# Patient Record
Sex: Female | Born: 1935 | Race: White | Hispanic: No | Smoking: Former smoker
Health system: Southern US, Community
[De-identification: ages and names within clinical notes are randomized; demographics above are authoritative.]

## PROBLEM LIST (undated history)

## (undated) DIAGNOSIS — K831 Obstruction of bile duct: Secondary | ICD-10-CM

## (undated) DIAGNOSIS — I509 Heart failure, unspecified: Secondary | ICD-10-CM

## (undated) DIAGNOSIS — J449 Chronic obstructive pulmonary disease, unspecified: Secondary | ICD-10-CM

## (undated) DIAGNOSIS — C50912 Malignant neoplasm of unspecified site of left female breast: Secondary | ICD-10-CM

## (undated) DIAGNOSIS — H3530 Unspecified macular degeneration: Secondary | ICD-10-CM

## (undated) DIAGNOSIS — K219 Gastro-esophageal reflux disease without esophagitis: Secondary | ICD-10-CM

## (undated) DIAGNOSIS — M1990 Unspecified osteoarthritis, unspecified site: Secondary | ICD-10-CM

## (undated) DIAGNOSIS — Z9981 Dependence on supplemental oxygen: Secondary | ICD-10-CM

## (undated) DIAGNOSIS — J189 Pneumonia, unspecified organism: Secondary | ICD-10-CM

## (undated) DIAGNOSIS — D649 Anemia, unspecified: Secondary | ICD-10-CM

## (undated) DIAGNOSIS — I10 Essential (primary) hypertension: Secondary | ICD-10-CM

## (undated) DIAGNOSIS — I4891 Unspecified atrial fibrillation: Secondary | ICD-10-CM

## (undated) HISTORY — PX: BREAST SURGERY: SHX581

## (undated) HISTORY — DX: Pneumonia, unspecified organism: J18.9

## (undated) HISTORY — PX: ADENOIDECTOMY: SHX5191

## (undated) HISTORY — PX: JOINT REPLACEMENT: SHX530

## (undated) HISTORY — PX: PLANTAR'S WART EXCISION: SHX2240

## (undated) HISTORY — PX: CERVICAL DISC SURGERY: SHX588

## (undated) HISTORY — PX: CATARACT EXTRACTION BILATERAL W/ ANTERIOR VITRECTOMY: SHX1304

## (undated) HISTORY — PX: CARDIAC CATHETERIZATION: SHX172

## (undated) HISTORY — PX: BACK SURGERY: SHX140

## (undated) HISTORY — PX: KNEE ARTHROSCOPY: SHX127

## (undated) HISTORY — PX: EXCISION / BIOPSY BREAST / NIPPLE / DUCT: SUR469

## (undated) HISTORY — PX: COLONOSCOPY: SHX174

## (undated) HISTORY — PX: DILATION AND CURETTAGE OF UTERUS: SHX78

## (undated) HISTORY — PX: CHOLECYSTECTOMY OPEN: SUR202

## (undated) HISTORY — PX: ANTERIOR CERVICAL DECOMP/DISCECTOMY FUSION: SHX1161

## (undated) HISTORY — PX: SHOULDER OPEN ROTATOR CUFF REPAIR: SHX2407

---
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
## 1960-02-04 HISTORY — PX: ABDOMINAL HYSTERECTOMY: SHX81

## 1998-01-11 ENCOUNTER — Ambulatory Visit (HOSPITAL_COMMUNITY): Admission: RE | Admit: 1998-01-11 | Discharge: 1998-01-11 | Payer: Self-pay | Admitting: Internal Medicine

## 1999-12-04 ENCOUNTER — Encounter: Payer: Self-pay | Admitting: Internal Medicine

## 1999-12-04 ENCOUNTER — Encounter: Admission: RE | Admit: 1999-12-04 | Discharge: 1999-12-04 | Payer: Self-pay | Admitting: Internal Medicine

## 2000-12-09 ENCOUNTER — Encounter: Payer: Self-pay | Admitting: Internal Medicine

## 2000-12-09 ENCOUNTER — Encounter: Admission: RE | Admit: 2000-12-09 | Discharge: 2000-12-09 | Payer: Self-pay | Admitting: Internal Medicine

## 2001-03-23 ENCOUNTER — Ambulatory Visit (HOSPITAL_COMMUNITY): Admission: RE | Admit: 2001-03-23 | Discharge: 2001-03-23 | Payer: Self-pay | Admitting: Internal Medicine

## 2001-09-02 ENCOUNTER — Ambulatory Visit (HOSPITAL_BASED_OUTPATIENT_CLINIC_OR_DEPARTMENT_OTHER): Admission: RE | Admit: 2001-09-02 | Discharge: 2001-09-02 | Payer: Self-pay | Admitting: Orthopedic Surgery

## 2001-12-10 ENCOUNTER — Encounter: Payer: Self-pay | Admitting: Internal Medicine

## 2001-12-10 ENCOUNTER — Encounter: Admission: RE | Admit: 2001-12-10 | Discharge: 2001-12-10 | Payer: Self-pay | Admitting: Internal Medicine

## 2002-02-17 ENCOUNTER — Ambulatory Visit (HOSPITAL_BASED_OUTPATIENT_CLINIC_OR_DEPARTMENT_OTHER): Admission: RE | Admit: 2002-02-17 | Discharge: 2002-02-18 | Payer: Self-pay | Admitting: Orthopedic Surgery

## 2002-10-07 ENCOUNTER — Encounter: Payer: Self-pay | Admitting: Internal Medicine

## 2002-10-07 ENCOUNTER — Encounter: Admission: RE | Admit: 2002-10-07 | Discharge: 2002-10-07 | Payer: Self-pay | Admitting: Internal Medicine

## 2002-12-15 ENCOUNTER — Encounter: Admission: RE | Admit: 2002-12-15 | Discharge: 2002-12-15 | Payer: Self-pay | Admitting: Internal Medicine

## 2003-02-07 ENCOUNTER — Encounter: Admission: RE | Admit: 2003-02-07 | Discharge: 2003-02-07 | Payer: Self-pay | Admitting: Orthopedic Surgery

## 2004-01-02 ENCOUNTER — Inpatient Hospital Stay (HOSPITAL_COMMUNITY): Admission: EM | Admit: 2004-01-02 | Discharge: 2004-01-04 | Payer: Self-pay | Admitting: Emergency Medicine

## 2004-01-06 ENCOUNTER — Inpatient Hospital Stay (HOSPITAL_COMMUNITY): Admission: EM | Admit: 2004-01-06 | Discharge: 2004-01-07 | Payer: Self-pay | Admitting: Neurosurgery

## 2004-02-15 ENCOUNTER — Encounter: Admission: RE | Admit: 2004-02-15 | Discharge: 2004-02-15 | Payer: Self-pay | Admitting: Internal Medicine

## 2004-12-13 ENCOUNTER — Ambulatory Visit: Payer: Self-pay | Admitting: Gastroenterology

## 2004-12-25 ENCOUNTER — Ambulatory Visit: Payer: Self-pay | Admitting: Gastroenterology

## 2005-01-15 ENCOUNTER — Ambulatory Visit: Payer: Self-pay | Admitting: Gastroenterology

## 2005-01-28 ENCOUNTER — Ambulatory Visit: Payer: Self-pay | Admitting: Gastroenterology

## 2005-02-06 ENCOUNTER — Encounter (INDEPENDENT_AMBULATORY_CARE_PROVIDER_SITE_OTHER): Payer: Self-pay | Admitting: *Deleted

## 2005-02-06 ENCOUNTER — Ambulatory Visit: Payer: Self-pay | Admitting: Gastroenterology

## 2005-02-19 ENCOUNTER — Ambulatory Visit: Payer: Self-pay | Admitting: Gastroenterology

## 2005-02-20 ENCOUNTER — Encounter: Admission: RE | Admit: 2005-02-20 | Discharge: 2005-02-20 | Payer: Self-pay | Admitting: Internal Medicine

## 2006-02-23 ENCOUNTER — Encounter: Admission: RE | Admit: 2006-02-23 | Discharge: 2006-02-23 | Payer: Self-pay | Admitting: *Deleted

## 2006-03-04 ENCOUNTER — Encounter: Admission: RE | Admit: 2006-03-04 | Discharge: 2006-03-04 | Payer: Self-pay | Admitting: Internal Medicine

## 2006-04-22 ENCOUNTER — Encounter: Admission: RE | Admit: 2006-04-22 | Discharge: 2006-04-22 | Payer: Self-pay | Admitting: Internal Medicine

## 2006-07-01 ENCOUNTER — Encounter: Admission: RE | Admit: 2006-07-01 | Discharge: 2006-07-01 | Payer: Self-pay | Admitting: Internal Medicine

## 2007-02-25 ENCOUNTER — Encounter: Admission: RE | Admit: 2007-02-25 | Discharge: 2007-02-25 | Payer: Self-pay | Admitting: Internal Medicine

## 2007-04-16 ENCOUNTER — Encounter: Admission: RE | Admit: 2007-04-16 | Discharge: 2007-04-16 | Payer: Self-pay | Admitting: Internal Medicine

## 2008-02-28 ENCOUNTER — Encounter: Admission: RE | Admit: 2008-02-28 | Discharge: 2008-02-28 | Payer: Self-pay | Admitting: Internal Medicine

## 2008-05-17 ENCOUNTER — Encounter: Admission: RE | Admit: 2008-05-17 | Discharge: 2008-05-17 | Payer: Self-pay | Admitting: Internal Medicine

## 2008-10-30 ENCOUNTER — Inpatient Hospital Stay (HOSPITAL_COMMUNITY): Admission: RE | Admit: 2008-10-30 | Discharge: 2008-10-31 | Payer: Self-pay | Admitting: Neurosurgery

## 2008-10-30 HISTORY — PX: LUMBAR DISC SURGERY: SHX700

## 2009-02-28 ENCOUNTER — Encounter: Admission: RE | Admit: 2009-02-28 | Discharge: 2009-02-28 | Payer: Self-pay | Admitting: Internal Medicine

## 2010-02-23 ENCOUNTER — Encounter: Payer: Self-pay | Admitting: Internal Medicine

## 2010-03-01 ENCOUNTER — Encounter
Admission: RE | Admit: 2010-03-01 | Discharge: 2010-03-01 | Payer: Self-pay | Source: Home / Self Care | Attending: Internal Medicine | Admitting: Internal Medicine

## 2010-04-15 ENCOUNTER — Other Ambulatory Visit: Payer: Self-pay | Admitting: Family Medicine

## 2010-04-15 ENCOUNTER — Ambulatory Visit
Admission: RE | Admit: 2010-04-15 | Discharge: 2010-04-15 | Disposition: A | Discharge status: Home / Self Care | Payer: Medicare Other | Source: Ambulatory Visit | Attending: Family Medicine | Admitting: Family Medicine

## 2010-04-15 DIAGNOSIS — J40 Bronchitis, not specified as acute or chronic: Secondary | ICD-10-CM

## 2010-05-10 LAB — CBC
HCT: 38.4 % (ref 36.0–46.0)
Hemoglobin: 13 g/dL (ref 12.0–15.0)
MCHC: 33.9 g/dL (ref 30.0–36.0)
MCV: 92.1 fL (ref 78.0–100.0)
Platelets: 257 10*3/uL (ref 150–400)
RBC: 4.17 MIL/uL (ref 3.87–5.11)
RDW: 13.8 % (ref 11.5–15.5)
WBC: 8.5 10*3/uL (ref 4.0–10.5)

## 2010-05-10 LAB — BASIC METABOLIC PANEL
BUN: 31 mg/dL — ABNORMAL HIGH (ref 6–23)
CO2: 30 mEq/L (ref 19–32)
Calcium: 10 mg/dL (ref 8.4–10.5)
Chloride: 104 mEq/L (ref 96–112)
Creatinine, Ser: 1.4 mg/dL — ABNORMAL HIGH (ref 0.4–1.2)
GFR calc Af Amer: 45 mL/min — ABNORMAL LOW (ref >60)
GFR calc non Af Amer: 37 mL/min — ABNORMAL LOW (ref >60)
Glucose, Bld: 110 mg/dL — ABNORMAL HIGH (ref 70–99)
Potassium: 4.1 mEq/L (ref 3.5–5.1)
Sodium: 143 mEq/L (ref 135–145)

## 2010-06-21 NOTE — Op Note (Signed)
NAMEJANETTE, HARVIE NO.:  [id number]   MEDICAL RECORD NO.:  [id number]          PATIENT TYPE:  OIB   LOCATION:  3006                         FACILITY:  [facility]   PHYSICIAN:  Hewitt Shorts, M.D.DATE OF BIRTH:  03/03/1933   DATE OF PROCEDURE:  DATE OF DISCHARGE:  01/07/2004                                 OPERATIVE REPORT   DATE OF OPERATION:  January 06, 2004.   PREOPERATIVE DIAGNOSES:  C5-6 and C6-7 cervical disk herniation, cervical  spondylosis, cervical degenerative disk disease, and cervical radiculopathy.   POSTOPERATIVE DIAGNOSES:  C5-6 and C6-7 cervical disk herniation, cervical  spondylosis, cervical degenerative disk disease, and cervical radiculopathy.   PROCEDURE:  C5-6 and C6-7 anterior cervical diskectomy and arthrodesis with  iliac crest allograft and tether cervical plating.   SURGEON:  Hewitt Shorts, MD.   Threasa HeadsPhoebe Perch.   ANESTHESIA:  General endotracheal.   INDICATION:  The patient is a 77 year old woman, who presented with an acute  left cervical radiculopathy, who was found by MRI scan to have large a disk  herniation, central and to the left, at C6-7, and a broad-based disk  herniation at C5-6 superimposed upon underlying degenerative disk disease  and spondylosis.  The decision was made to proceed with a two-level anterior  cervical diskectomy and arthrodesis.   DESCRIPTION OF PROCEDURE:  The patient was brought to the operating room and  placed under general endotracheal anesthesia.  The patient was placed in 10  pounds of halter traction.  The neck was prepped with Betadine soap and  solution and draped in a sterile fashion.  After local anesthesia with  epinephrine, an incision was made and carried down through the subcutaneous  tissue, bipolar cautery and electrocautery used to maintain hemostasis.  Dissection was then carried out through an avascular plane leaving the  sternocleidomastoid, the parotid artery, and  jugular vein artery, trachea,  and esophagus medially.  The ventral aspect of the vertebral column was  identified and a localizing x-ray taken, and the C5-6 and C6-7  intervertebral disk space identified.  Diskectomy was begun at each level  incision of the annulus and continued with the microcurettes and pituitary  rongeurs.  Anterior osteophytic overgrowth was removed using an osteophyte  removal tool as well as the Ex Max drill.  The cartilaginous end plates of  the corresponding vertebra were removed using microcurettes along with the  Ex Max drill, and the microscope was draped and brought onto the field to  provide additional magnification, illumination, and visualization, and the  remainder of the decompression was performed using microdissection in a  microsurgical technique.  There was significant posterior osteophytic  overgrowth at each level.  This was removed using an Ex Max drill along with  a 2 mm Kerrison punch with a thin footplate.  The spondylitic disk  herniation at the C5-6 level was carefully removed, and we were able to  decompress the spinal canal and the thecal sac as well as a narrow foramina  and nerve roots bilaterally.  At the C6-7 level, the second posterior  longitudinal  ligament was removed, the disk herniation was removed, and we  were able to decompress the spinal canal and thecal sac as well as the nerve  root and neuroforamen bilaterally.  Once the decompression was completed,  hemostasis was established with the use of Gelfoam soaked in thrombin.  We  measured the height of each of the intervertebral disk spaces with bone  sizers and selected a 6-mm graft for the C6-7 level and a 7-mm graft for the  C5-6 level.  Each graft was hydrated in saline solution and then  subsequently positioned in the intervertebral disk space and counter sunk.  The traction was then discontinued.  We selected a 32 mm tether cervical  plate.  It was positioned over the fusion  construct and secured to each of  the vertebra with bone screws measuring 4 x 14 mm.  Two screws were placed  at C5 and two at C7, and a single screw placed at C6.  Each screw hole was  drilled and tapped, and the screws were placed.  The screws themselves were  placed in an alternating fashion.  Once all five screws were placed a final  tightening was done, and then the wound was irrigated with Bacitracin  solution and checked for hemostasis with established confirmed.  An x-ray  was taken; unfortunately, visualization was limited due to the patient's  large shoulders.  The screws at C5 appeared in good position.  However,  under direct visualization the fusion construct appeared good, and then we  proceeded with closure.  The platysma was closed with interrupted, inverted  2-0 undyed Vicryl sutures, the subcutaneous and subcuticular were closed  with interrupted, inverted, 3-0 undyed Vicryl sutures, and the skin was  approximated with Dermabond.  The patient tolerated the procedure well.  Estimated blood loss was 100 mL.  Sponge count correct.  Following surgery,  the patient was placed  in a soft cervical collar, reversed from anesthetic,  to be extubated and transferred to the recovery room for further care.      Robe   RWN/MEDQ  D:  01/06/2004  T:  01/07/2004  Job:  [id number]

## 2010-06-21 NOTE — H&P (Signed)
Jordan Byrd, Jordan Byrd                ACCOUNT NO.:  [id number]   MEDICAL RECORD NO.:  [id number]          PATIENT TYPE:  INP   LOCATION:  1856                         FACILITY:  [facility]   PHYSICIAN:  Erskine Speed, M.D.   DATE OF BIRTH:  Mar 19, 1933   DATE OF ADMISSION:  01/02/2004  DATE OF DISCHARGE:                                HISTORY & PHYSICAL   PROBLEM LIST:  1.  Syncope.   SUBJECTIVE:  1.  Cervical disk disease.  The patient has been having some problems with      pain in her left shoulder. She saw an orthopedist and was ultimately      given an injection into the shoulder but it did not help. Originally,      Dr. Chaney Malling told her that it was bursitis.  Then he was concerned that      it might be a cervical disk and had ordered an MRI that was to be done      tomorrow.  She was put on Vicodin and Flexeril on December 25, 2003.  2.  High blood pressure. The patient's weight was down 7 pounds at the time      of her November 28, 2003 complete physical exam for a total of 30 pounds      of weight reduction on a good diet and she was feeling great.  3.  Reoccurring sinusitis -  no symptoms.  4.  Macular degeneration - no symptoms that are new.  5.  Degenerative joint disease.  6.  Esophageal reflux - no symptoms.   ACUTE PROBLEM:  Syncope.   SUBJECTIVE:  Over the last 3 days patient has had a history of mild  dizziness. There was no actual vertigo but there was a sense of imbalance.  On December 30, 2003 the patient had a presyncopal episode with dimming of  her vision and nearly fainted. Today when she got up she had a syncopal  episode that lasted 1-2 seconds and she fell to the floor (on the carpet)  without injury.  When she awoke she called for help. She has had some mild  vertigo but mostly just marked dizziness and mild nausea with sudden  position change into the sitting or standing position.  When she is supine  she feels fine. There has been no chest pain or  shortness of breath. The  patient did continue to have left shoulder pain from her orthopedic problems  as previously described.  There has been no history of GI bleeding,  respiratory problems, cough or hemoptysis.  There has been no fever.   MEDICATIONS:  1.  Advair 50/250 2 puffs q.a.m.  2.  Diovan 325 mg daily.  3.  Hydrochlorothiazide 25 mg daily.  4.  Cardura 8 mg daily.  5.  Toprol XL 50 mg daily.  6.  Premarin 0.625 mg daily.  7.  Relafen 750 mg daily.  8.  Aciphex 20 mg daily.   ALLERGIES:  No known drug allergies - none listed.   FAMILY HISTORY:  Noncontributory.   PAST HISTORY:  Noncontributory.  OBJECTIVE:  VITAL SIGNS:  Supine blood pressure was 135/85 with a heart rate  of 80. Sitting, blood pressure was 90/70 with a heart rate of 90.  HEENT:  Upper dental plates are noted.  NECK:  Normal. Carotids are 2+ without bruit. Thyroid was negative. Neck  exam revealed the same posterior cervical HNP scar previously outlined.  CHEST:  Clear.  BREAST EXAM:  Unremarkable.  CARDIOVASCULAR:  Regular rate and rhythm without murmur.  ABDOMEN:  Soft, flat and nontender, no organomegaly or abnormal masses.  There were no abdominal bruits.  The patient is status post cholecystectomy.  PELVIC:  Deferred.  RECTAL:  Was done on November 28, 2003 and it was normal and not repeated at  this time.  SKIN:  Joints are_______and distal circulation was unremarkable.  EXTREMITIES:  There was trace to 12+ pretibial edema.   ASSESSMENT:  Postural hypotension, could be related to her medications.   PLAN:  Admit for intravenous fluids, further evaluation. Cardiac monitoring.  Hold all potentially narcotic medications except Halcion. Recheck in the  morning.       EJG/MEDQ  D:  01/02/2004  T:  01/02/2004  Job:  [id number]

## 2010-06-21 NOTE — Op Note (Signed)
NAME:  Jordan Byrd, Jordan Byrd                          ACCOUNT NO.:  [id number]   MEDICAL RECORD NO.:  [id number]                   PATIENT TYPE:  AMB   LOCATION:  DSC                                  FACILITY:  [facility]   PHYSICIAN:  Rodney A. Chaney Malling, M.D.           DATE OF BIRTH:  February 08, 1933   DATE OF PROCEDURE:  02/17/2002  DATE OF DISCHARGE:                                 OPERATIVE REPORT   PREOPERATIVE DIAGNOSIS:  Frayed biceps attachment, left shoulder.  Impingement syndrome, left shoulder.   POSTOPERATIVE DIAGNOSIS:  Frayed biceps attachment, left shoulder.  Impingement syndrome, left shoulder.   OPERATION PERFORMED:  Diagnostic arthroscopy, intra-articular debridement  biceps attachment, left shoulder; open subacromial decompression, left  shoulder.   SURGEON:  Lenard Galloway. Chaney Malling, M.D.   ANESTHESIA:  General.   DESCRIPTION OF PROCEDURE:  After satisfactory general anesthesia, the  patient was placed on the operating table in a semi seated position, the  left upper extremity and shoulder were prepped with DuraPrep and draped out  in the usual manner.  Arthroscope was introduced into the posterior portal.  Very careful examination of the entire contents was undertaken.  The  articular cartilage over the over the humeral head and the glenoid was  absent.  The posterior inferior and anterior glenoid and labrum appeared  normal.  Superiorly the biceps could be seen coming in the shoulder and ran  proximally to its attachment at the superior margin of the glenoid and this  was freed.  This was freed only on the undersurface.  Through an anterior  operative portal, a probe was inserted.  The biceps was palpated with the  probe.  The biceps could not be displaced into the joint.  However, there  was significant fraying of the biceps at its attachment, but this did not  appear to be unstable.  Through the anterior portal, an intra-articular  shaver was introduced and the frayed  portion of the biceps was debrided.  No  other significant pathology seen in the shoulder at this point.  The  arthroscope was then placed in the subacromial space and no tears of the  rotator cuff could be seen.  A saber cut incision was made over the  anterolateral aspect of the shoulder.  Skin edges were retracted.  The  deltoid fibers were released off the anterior aspect of the acromion only.  Using the power saw a very generous Neer anterior one third acromioplasty  was done and excellent visualization was seen.  No tears of the rotator cuff  could be seen.  No evidence of any other abnormality was noted. Throughout  the procedure, the shoulder was irrigated with copious amounts of antibiotic  solution.  Using 0 Vicryl, the deltoid cuff was then reattached to the  anterior aspect of the acromion.  2-0 Vicryl was used to close the  subcutaneous tissue.  Stainless steel staples were used to close  the skin.  Marcaine infiltrated around the wound.  Sterile dressings were applied.  The  patient was then transferred to the recovery room in excellent condition.  Technically, this procedure went extremely well.   DRAINS:  None.   COMPLICATIONS:  None.                                                Rodney A. Chaney Malling, M.D.   RAM/MEDQ  D:  02/17/2002  T:  02/17/2002  Job:  [id number]

## 2010-06-21 NOTE — Op Note (Signed)
NAME:  Jordan Byrd, Jordan Byrd                          ACCOUNT NO.:  [id number]   MEDICAL RECORD NO.:  [id number]                   PATIENT TYPE:   LOCATION:                                       FACILITY:   PHYSICIAN:  Rodney A. Chaney Malling, M.D.           DATE OF BIRTH:   DATE OF PROCEDURE:  09/02/2001  DATE OF DISCHARGE:                                 OPERATIVE REPORT   PREOPERATIVE DIAGNOSES:  1. Osteoarthritis, right knee.  2. Tear of medial meniscus, right knee.   POSTOPERATIVE DIAGNOSES:  1. Osteoarthritis, right knee.  2. Complex tear of posterior horn of medial meniscus, right knee.  3. Large anterior osteophyte at the intercondylar notch area of the right     knee.   OPERATIONS:  1. Arthroscopy.  2. Removal of osteophytic spur of tibial notch area.  3. Partial medial meniscectomy, right knee.   SURGEON:  Lenard Galloway. Chaney Malling, M.D.   ANESTHESIA:  General.   PATHOLOGY:  With the arthroscope, very careful examination of the entire  contents was undertaken.  The patellofemoral joint was visualized first.  There was a large area of grade 4 total loss of articular cartilage over the  posterior aspect of the toe.  There was also a very large area over the  anterior aspect of the lateral femoral condyle where there was total loss of  all articular cartilage.  There was a ridge of osteophytes along the  superior margin of the medial femoral condyle anteriorly.  With the  arthroscope in the lateral compartment, the articular cartilage of the  lateral femoral condyle in the weightbearing area was fairly normal as was  the articular cartilage of the lateral tibial plateau.  The entire lateral  meniscus was normal.  The arthroscope was then passed into the intercondylar  notch.  The ACL was intact, but there was a very large osteophytic spur  anterior to the tibial spine, which on extension was banging into the  intercondylar notch.  The arthroscope was then passed into the medial  compartment and there was a complex tear of the posterior horn of the medial  meniscus.  The articular cartilage of the medial femoral condyle and medial  tibial plateau was fairly normal.   DESCRIPTION OF PROCEDURE:  The patient was placed on the operating table in  the supine position with the pneumatic tourniquet about the right upper  thigh.  The right leg was placed in a leg holder.  The entire right lower  extremity was prepped and draped in the usual sterile manner.  An infusion  cannula was placed in the superior medial pouch and the knee distended with  saline.  Anteromedial and anterolateral portals were introduced.  The  findings were as described above.  Attention was first turned to the tibial  spine area.  There was a very large anterior osteophytic spur.  An osteotome  was slipped in the lateral portal  and the base of the osteophyte was  amputated.  The pituitary rongeur was then inserted and this large  osteophyte was removed as a single large entity.  The base was then debrided  with intra-articular shaver.  Attention was then turned to the medial  compartment.  Through both portals the posterior third of the medial  meniscus was extensively debrided.  Excellent decompression was achieved.  The intra-articular shaver was then introduced and all debris was removed.  The posterior rim was smooth and  balanced with nice transition of the mid third of the medial meniscus.  The  knee was then filled with Marcaine.  A large bulky compressive dressing was  applied.  The patient then returned to the recovery room in excellent  condition.  The technique went extremely well.                                               Rodney A. Chaney Malling, M.D.    RAM/MEDQ  D:  09/02/2001  T:  09/07/2001  Job:  [id number]

## 2010-06-21 NOTE — Discharge Summary (Signed)
NAMEBERNETHA, ANSCHUTZ                ACCOUNT NO.:  [id number]   MEDICAL RECORD NO.:  [id number]          PATIENT TYPE:  INP   LOCATION:  6524                         FACILITY:  [facility]   PHYSICIAN:  Erskine Speed, M.D.   DATE OF BIRTH:  05/09/1933   DATE OF ADMISSION:  01/02/2004  DATE OF DISCHARGE:  01/04/2004                                 DISCHARGE SUMMARY   PROBLEM:  Hypotension.   SUBJECTIVE:  This 77 year old patient had been on pain medication after a  procedure for her back and neck disk disease.  She had been put on Vicodin  and Flexeril by her orthopedist.  When she was seen on the day of admission,  blood pressure was dropping at 135/85 supine and 90/70 sitting.  Because her  postural hypotension probably secondary to medications, the patient was  admitted to the hospital.   HOSPITAL COURSE:  The patient was admitted to the hospital and IV fluids  were given.  Narcotics were stopped.  Blood pressure medicines were held.  On January 03, 2004, the patient felt much better.  She had no dizziness  when she was walking to the bathroom.  Cardiac monitor revealed normal sinus  rhythm.  Vital signs were stable.   By January 04, 2004, the patient was feeling fine.  She was having some neck  and shoulder pain again, but no dizziness or syncope.  With blood pressure  having been stabilized, the patient was going to be ready for discharge.  As  she was ultimately going to have an MRI of the neck ordered by Dr.  Chaney Malling, this was performed prior to her discharge.  An MRI of the  cervical spine was performed on January 04, 2004.  It showed a prominent  soft disk herniation C5 and 6 into the left neural foramen, severely  compressing left C7.  Other nonsurgical findings are noted on the report.   PLAN:  A copy of this was referred to Dr. Shirlean Kelly for immediate  evaluation.  The patient was discharged on Tylenol for pain, no blood  pressure medicines.  Ultracet two pills three  times a day for pain.  She was  to see Dr. Newell Coral immediately to consider neck surgery.  She is to see Dr.  Erskine Speed on January 08, 2004.   FINAL DIAGNOSIS:  Hypotension secondary to narcotic pain medicines and  muscle relaxers.   Discharge condition improved.   CONSULTATIONS:  None.   PROCEDURE:  None.   COMPLICATIONS:  None.   DISCHARGE ACTIVITIES:  Walk carefully.  Avoid falling.  Pain management as  noted.   DISCHARGE DIET:  No added salt.  Generally no restrictions in her usual  diet.   Followup with Dr. Newell Coral as noted above.       EJG/MEDQ  D:  02/16/2004  T:  02/16/2004  Job:  [id number]

## 2010-10-08 ENCOUNTER — Other Ambulatory Visit: Payer: Self-pay | Admitting: Neurosurgery

## 2010-10-08 DIAGNOSIS — M549 Dorsalgia, unspecified: Secondary | ICD-10-CM

## 2010-10-16 ENCOUNTER — Ambulatory Visit
Admission: RE | Admit: 2010-10-16 | Discharge: 2010-10-16 | Disposition: A | Discharge status: Home / Self Care | Payer: Medicare Other | Source: Ambulatory Visit | Attending: Neurosurgery | Admitting: Neurosurgery

## 2010-10-16 DIAGNOSIS — M549 Dorsalgia, unspecified: Secondary | ICD-10-CM

## 2010-10-16 MED ORDER — GADOBENATE DIMEGLUMINE 529 MG/ML IV SOLN
10.0000 mL | Freq: Once | INTRAVENOUS | Status: AC | PRN
  Administered 2010-10-16: 10 mL via INTRAVENOUS

## 2011-02-17 ENCOUNTER — Other Ambulatory Visit: Payer: Self-pay | Admitting: Internal Medicine

## 2011-02-17 DIAGNOSIS — Z1231 Encounter for screening mammogram for malignant neoplasm of breast: Secondary | ICD-10-CM

## 2011-03-14 ENCOUNTER — Ambulatory Visit
Admission: RE | Admit: 2011-03-14 | Discharge: 2011-03-14 | Disposition: A | Discharge status: Home / Self Care | Payer: Medicare Other | Source: Ambulatory Visit | Attending: Internal Medicine | Admitting: Internal Medicine

## 2011-03-14 ENCOUNTER — Encounter (HOSPITAL_COMMUNITY): Payer: Self-pay | Admitting: Pharmacy Technician

## 2011-03-14 DIAGNOSIS — Z1231 Encounter for screening mammogram for malignant neoplasm of breast: Secondary | ICD-10-CM

## 2011-03-17 ENCOUNTER — Other Ambulatory Visit: Payer: Self-pay | Admitting: Physician Assistant

## 2011-03-18 ENCOUNTER — Encounter (HOSPITAL_COMMUNITY): Payer: Self-pay

## 2011-03-18 ENCOUNTER — Encounter (HOSPITAL_COMMUNITY)
Admission: RE | Admit: 2011-03-18 | Discharge: 2011-03-18 | Disposition: A | Discharge status: Home / Self Care | Payer: Medicare Other | Source: Ambulatory Visit | Attending: Orthopedic Surgery | Admitting: Orthopedic Surgery

## 2011-03-18 ENCOUNTER — Other Ambulatory Visit: Payer: Self-pay

## 2011-03-18 HISTORY — DX: Unspecified osteoarthritis, unspecified site: M19.90

## 2011-03-18 HISTORY — DX: Essential (primary) hypertension: I10

## 2011-03-18 LAB — DIFFERENTIAL
Eosinophils Relative: 4 % (ref 0–5)
Lymphocytes Relative: 20 % (ref 12–46)
Lymphs Abs: 1.7 10*3/uL (ref 0.7–4.0)
Monocytes Absolute: 0.5 10*3/uL (ref 0.1–1.0)
Neutro Abs: 5.8 10*3/uL (ref 1.7–7.7)

## 2011-03-18 LAB — SURGICAL PCR SCREEN
MRSA, PCR: NEGATIVE
Staphylococcus aureus: NEGATIVE

## 2011-03-18 LAB — COMPREHENSIVE METABOLIC PANEL
ALT: 13 U/L (ref 0–35)
AST: 15 U/L (ref 0–37)
Albumin: 3.8 g/dL (ref 3.5–5.2)
Calcium: 10.2 mg/dL (ref 8.4–10.5)
Creatinine, Ser: 1.16 mg/dL — ABNORMAL HIGH (ref 0.50–1.10)
Sodium: 140 mEq/L (ref 135–145)
Total Protein: 7.5 g/dL (ref 6.0–8.3)

## 2011-03-18 LAB — CBC
MCH: 29.3 pg (ref 26.0–34.0)
MCHC: 32 g/dL (ref 30.0–36.0)
MCV: 91.5 fL (ref 78.0–100.0)
Platelets: 323 10*3/uL (ref 150–400)
RBC: 4.47 MIL/uL (ref 3.87–5.11)
RDW: 13.3 % (ref 11.5–15.5)

## 2011-03-18 LAB — URINALYSIS, ROUTINE W REFLEX MICROSCOPIC
Bilirubin Urine: NEGATIVE
Hgb urine dipstick: NEGATIVE
Nitrite: NEGATIVE
Protein, ur: NEGATIVE mg/dL
Specific Gravity, Urine: 1.021 (ref 1.005–1.030)
Urobilinogen, UA: 0.2 mg/dL (ref 0.0–1.0)

## 2011-03-18 LAB — TYPE AND SCREEN
ABO/RH(D): A NEG
Antibody Screen: NEGATIVE

## 2011-03-18 LAB — URINE MICROSCOPIC-ADD ON

## 2011-03-18 MED ORDER — CHLORHEXIDINE GLUCONATE 4 % EX LIQD: 60.0000 mL | Freq: Once | CUTANEOUS | Status: DC

## 2011-03-18 NOTE — Progress Notes (Signed)
Dr Beverely Pace called for current cardiac info.  Ekg,stress.

## 2011-03-18 NOTE — Pre-Procedure Instructions (Signed)
20 Jordan Byrd  03/18/2011   Your procedure is scheduled on:  03/28/11  Report to [facility] at *530 AM.  Call this number if you have problems the morning of surgery: [phone]   Remember:   Do not eat food:After Midnight.  May have clear liquids: up to 4 Hours before arrival.  Clear liquids include soda, tea, black coffee, apple or grape juice, broth.  Take these medicines the morning of surgery with A SIP OF WATER: *amlodipine,advair,toprol,prilosec   Do not wear jewelry, make-up or nail polish.  Do not wear lotions, powders, or perfumes. You may wear deodorant.  Do not shave 48 hours prior to surgery.  Do not bring valuables to the hospital.  Contacts, dentures or bridgework may not be worn into surgery.  Leave suitcase in the car. After surgery it may be brought to your room.  For patients admitted to the hospital, checkout time is 11:00 AM the day of discharge.   Patients discharged the day of surgery will not be allowed to drive home.  Name and phone number of your driver: family  Special Instructions: CHG Shower Use Special Wash: 1/2 bottle night before surgery and 1/2 bottle morning of surgery.   Please read over the following fact sheets that you were given: Pain Booklet, Coughing and Deep Breathing, Blood Transfusion Information, MRSA Information and Surgical Site Infection Prevention

## 2011-03-19 NOTE — Consult Note (Addendum)
Anesthesia:  Patient is a 78 year old female scheduled for a right THA on 03/28/11.  History includes asthma, HTN, former smoker, anxiety, and SOB, L2-S1 laminectomy 2010, and previous c-spine, shoulder, and knee surgery.  Labs reviewed.  BUN is elevated at 32.  Cr is 1.16.  UA shows large number of leukocytes, negative nitrites, WBC too many to count.  Final urine culture is pending, but >100,000 colonies GNR are present (result called to [facility] at Dr. Candise Bowens office on 03/20/11 @ 0915).  CXR from 04/15/10 shows no active lung disease. Stable mild cardiomegaly.  EKG from 03/18/11 shows NSR, LAD.  Overall I think the EKG appears stable.  She denies CP.  She saw Dr. Iline Oven Cheek with [facility] approximately 10 years ago for what sounds like an abnormal stress test, but subsequently had a "normal" cath [facility]).  She's unsure if she ever had an echo, but says that if it was done it would have been > 3 years ago.  Anticipate that she can proceed if no new CV symptoms.

## 2011-03-20 LAB — URINE CULTURE

## 2011-03-24 NOTE — H&P (Signed)
NAME: Jordan Byrd MRN: #[id number] DATE: March 13, 2011 DOB: 1933-08-16  COMPLAINT:     Follow up right hip osteoarthritis.  HPI:     The patient is a 78 year old female who presents with a history of right hip osteoarthritis, end stage; also with hypertension, asthma, and macular degeneration, and extensive lumbar and cervical spine history with Dr. Newell Coral.  She has failed conservative treatments with oral anti-inflammatories, intra-articular injection, and pain medications.  She uses a walker at baseline.  She has had previous x-rays done on 02/27/11 showing right hip severe bone-on-bone osteoarthritis.  It is significantly affecting her quality of life and activities of daily living.  She has the most pain with weightbearing.  It will awaken her from sleep at night.  She is here for further discussion of possible total hip arthroplasty today.  CURRENT MEDICATIONS:     HCTZ 25 mg daily, lisinopril 40 mg daily, amlodipine 5 mg daily, metoprolol 100 mg daily, Advair 250/50 mg 2 puffs inhaled daily, Prilosec 20 mg daily, glucosamine chondroitin, calcium 600 mg twice daily, and multivitamin.  ALLERGIES:     CODEINE, TEMARICSPAN, BUTAZDEDINALKA, ASCRINTION #3, ROBAXIN, DORIDIN, and CELEBREX.  She states Vicodin does not mix with her blood pressure medications.  Aspirin upsets her stomach.    PAST MEDICAL HISTORY:     Positive for hypertension, asthma, macular degeneration, significant cervical and lumbar history with Dr. Newell Coral.  PAST SURGICAL HISTORY:     Tonsillectomy 1943, adenoids 1949, foot surgery 1947, appendectomy 1954, hysterectomy 1962, gallbladder 1984, disc surgery upper back 1990, both knees and left shoulder surgery 2008, December 2007 and January 2008 cataract surgery both eyes, and September 2010 back surgery low back.  ROS:    Systemic review is positive for cataracts, glasses, contacts, partial dentures, shortness of breath, pneumonia, bronchitis, pleurisy, high blood pressure,  asthma, ankle swelling, kidney infection, frequent urination, nocturnal urination, headaches, and weight gain. Please refer to patient information form for details.  FAMILY HISTORY:    Positive for high blood pressure, diabetes, and stroke in mother; heart disease, heart attack, high blood pressure, and diabetes in father; heart attack, high blood pressure, diabetes, kidney disease, and cancer in sister.  SOCIAL HISTORY:    The patient is a nonsmoker.  She states she smoked a pack per day for two years; stopped in 1972.  She does not drink alcohol.  She lives alone.  She has three children.  She is widowed.  EXAM:     The patient is seated in the examination room in no acute distress.  She is alert, oriented, and appears appropriate age.  HT:  5'0"   WT:  219 pounds  BMI:  38.8  TEMP:  97.0 degrees  BP:  125/77   P:  80     R:  18  Skin:   No rashes, no lesions. HEENT:   PERRLA.  She has upper dentures.  Neck is supple.  Palpable carotid pulses.     Chest:   Some upper respiratory wheezing; otherwise normal breath sounds, lungs clear to auscultation bilaterally.  Heart:   Regular rate and rhythm.  No signs of murmurs.   Abdomen:   Soft, non tender.  Active bowel sounds in all four quadrants. Rectal/Brest:    Not indicated for surgery. Neuro:   Cranial nerves grossly intact. Musculoskeletal:    Examination of the right lower extremity shows sensation intact to light touch.  Negative tenderness to palpation of the calf.  She has severe  pain of the right hip with internal and external rotation.  Flexion and extension significantly limited.   X-RAYS:     No images were obtained today.  X-rays of the right hip taken on 02/27/11 show right hip bone-on-bone severe osteoarthritis.   IMPRESSION:      1. Right hip osteoarthritis, end stage. 2. Asthma. 3. Hypertension. 4. High cholesterol.  RECOMMENDATIONS:     The risks and benefits of right total hip arthroplasty were discussed again today and the  patient wishes to proceed.  She has a preadmission appointment scheduled on 03/18/11.  Surgery is currently scheduled for 03/28/11.  She has obtained medical and cardiac clearance from her primary care physician, Dr. Nila Nephew, who states he would be available for consultation while inpatient.  The patient does live alone and will require skilled nursing facility following surgery.  The patient was given preoperative instructions.    We also discussed DVT prophylaxis following surgery and perioperative antibiotics.  If she has any questions prior to surgery, she may contact the office.   Josh Baylynn Shifflett P.A.-C/[id number]  Auto-Authenticated by Estanislado Spire P.A.-C  cc:  Nila Nephew, M.D.

## 2011-03-27 MED ORDER — ACETAMINOPHEN 10 MG/ML IV SOLN
1000.0000 mg | Freq: Four times a day (QID) | INTRAVENOUS | Status: DC
  Administered 2011-03-28: 1000 mg via INTRAVENOUS
  Filled 2011-03-27 (×3): qty 100

## 2011-03-27 MED ORDER — SODIUM CHLORIDE 0.9 % IV SOLN: INTRAVENOUS | Status: DC

## 2011-03-28 ENCOUNTER — Encounter (HOSPITAL_COMMUNITY)
Admission: RE | Disposition: A | Discharge status: Skilled Nursing Facility | Payer: Self-pay | Source: Ambulatory Visit | Attending: Orthopedic Surgery

## 2011-03-28 ENCOUNTER — Ambulatory Visit (HOSPITAL_COMMUNITY): Payer: Medicare Other

## 2011-03-28 ENCOUNTER — Ambulatory Visit (HOSPITAL_COMMUNITY): Payer: Medicare Other | Admitting: Vascular Surgery

## 2011-03-28 ENCOUNTER — Encounter (HOSPITAL_COMMUNITY): Payer: Self-pay | Admitting: *Deleted

## 2011-03-28 ENCOUNTER — Encounter (HOSPITAL_COMMUNITY): Payer: Self-pay | Admitting: Vascular Surgery

## 2011-03-28 ENCOUNTER — Inpatient Hospital Stay (HOSPITAL_COMMUNITY)
Admission: RE | Admit: 2011-03-28 | Discharge: 2011-04-01 | DRG: 470 | Disposition: A | Discharge status: Skilled Nursing Facility | Payer: Medicare Other | Source: Ambulatory Visit | Attending: Orthopedic Surgery | Admitting: Orthopedic Surgery

## 2011-03-28 DIAGNOSIS — J45909 Unspecified asthma, uncomplicated: Secondary | ICD-10-CM | POA: Diagnosis present

## 2011-03-28 DIAGNOSIS — D62 Acute posthemorrhagic anemia: Secondary | ICD-10-CM | POA: Diagnosis not present

## 2011-03-28 DIAGNOSIS — M1610 Unilateral primary osteoarthritis, unspecified hip: Principal | ICD-10-CM | POA: Diagnosis present

## 2011-03-28 DIAGNOSIS — F411 Generalized anxiety disorder: Secondary | ICD-10-CM | POA: Diagnosis present

## 2011-03-28 DIAGNOSIS — Z87891 Personal history of nicotine dependence: Secondary | ICD-10-CM

## 2011-03-28 DIAGNOSIS — Z01812 Encounter for preprocedural laboratory examination: Secondary | ICD-10-CM

## 2011-03-28 DIAGNOSIS — M169 Osteoarthritis of hip, unspecified: Principal | ICD-10-CM | POA: Diagnosis present

## 2011-03-28 DIAGNOSIS — I10 Essential (primary) hypertension: Secondary | ICD-10-CM | POA: Diagnosis present

## 2011-03-28 DIAGNOSIS — H3530 Unspecified macular degeneration: Secondary | ICD-10-CM | POA: Diagnosis present

## 2011-03-28 HISTORY — PX: TOTAL HIP ARTHROPLASTY: SHX124

## 2011-03-28 SURGERY — ARTHROPLASTY, HIP, TOTAL,POSTERIOR APPROACH
Anesthesia: General | Site: Hip | Laterality: Right | Wound class: Clean

## 2011-03-28 MED ORDER — HYDROMORPHONE HCL PF 1 MG/ML IJ SOLN
0.2500 mg | INTRAMUSCULAR | Status: DC | PRN
  Administered 2011-03-28 (×2): 0.5 mg via INTRAVENOUS

## 2011-03-28 MED ORDER — CEFAZOLIN SODIUM 1-5 GM-% IV SOLN
1.0000 g | Freq: Four times a day (QID) | INTRAVENOUS | Status: AC
  Administered 2011-03-28 – 2011-03-29 (×3): 1 g via INTRAVENOUS
  Filled 2011-03-28 (×3): qty 50

## 2011-03-28 MED ORDER — ACETAMINOPHEN 10 MG/ML IV SOLN
1000.0000 mg | Freq: Four times a day (QID) | INTRAVENOUS | Status: AC
  Administered 2011-03-28 – 2011-03-29 (×3): 1000 mg via INTRAVENOUS
  Filled 2011-03-28 (×4): qty 100

## 2011-03-28 MED ORDER — METOCLOPRAMIDE HCL 5 MG/ML IJ SOLN
5.0000 mg | Freq: Three times a day (TID) | INTRAMUSCULAR | Status: DC | PRN
  Filled 2011-03-28: qty 2

## 2011-03-28 MED ORDER — ACETAMINOPHEN 325 MG PO TABS
650.0000 mg | ORAL_TABLET | Freq: Four times a day (QID) | ORAL | Status: DC | PRN
  Administered 2011-03-29 – 2011-03-31 (×6): 650 mg via ORAL
  Filled 2011-03-28 (×7): qty 2

## 2011-03-28 MED ORDER — FLEET ENEMA 7-19 GM/118ML RE ENEM: 1.0000 | ENEMA | Freq: Once | RECTAL | Status: AC | PRN

## 2011-03-28 MED ORDER — ONDANSETRON HCL 4 MG/2ML IJ SOLN
INTRAMUSCULAR | Status: DC | PRN
  Administered 2011-03-28: 4 mg via INTRAVENOUS

## 2011-03-28 MED ORDER — METOPROLOL SUCCINATE ER 100 MG PO TB24
100.0000 mg | ORAL_TABLET | Freq: Every day | ORAL | Status: DC
  Administered 2011-03-29 – 2011-04-01 (×4): 100 mg via ORAL
  Filled 2011-03-28 (×5): qty 1

## 2011-03-28 MED ORDER — BUPIVACAINE-EPINEPHRINE 0.5% -1:200000 IJ SOLN: INTRAMUSCULAR | Status: DC | PRN

## 2011-03-28 MED ORDER — FENTANYL CITRATE 0.05 MG/ML IJ SOLN
INTRAMUSCULAR | Status: DC | PRN
  Administered 2011-03-28 (×2): 75 ug via INTRAVENOUS
  Administered 2011-03-28 (×2): 50 ug via INTRAVENOUS
  Administered 2011-03-28: 100 ug via INTRAVENOUS

## 2011-03-28 MED ORDER — OXYCODONE HCL 5 MG PO TABS
5.0000 mg | ORAL_TABLET | ORAL | Status: DC | PRN
  Administered 2011-03-29 – 2011-03-31 (×13): 5 mg via ORAL
  Filled 2011-03-28 (×13): qty 1

## 2011-03-28 MED ORDER — PROPOFOL 10 MG/ML IV EMUL
INTRAVENOUS | Status: DC | PRN
  Administered 2011-03-28: 100 mg via INTRAVENOUS
  Administered 2011-03-28: 50 mg via INTRAVENOUS

## 2011-03-28 MED ORDER — EPHEDRINE SULFATE 50 MG/ML IJ SOLN
INTRAMUSCULAR | Status: DC | PRN
  Administered 2011-03-28 (×3): 10 mg via INTRAVENOUS

## 2011-03-28 MED ORDER — GLYCOPYRROLATE 0.2 MG/ML IJ SOLN
INTRAMUSCULAR | Status: DC | PRN
  Administered 2011-03-28: .5 mg via INTRAVENOUS

## 2011-03-28 MED ORDER — ENOXAPARIN SODIUM 30 MG/0.3ML ~~LOC~~ SOLN
30.0000 mg | Freq: Two times a day (BID) | SUBCUTANEOUS | Status: DC
  Administered 2011-03-28 – 2011-04-01 (×8): 30 mg via SUBCUTANEOUS
  Filled 2011-03-28 (×9): qty 0.3

## 2011-03-28 MED ORDER — FLUTICASONE-SALMETEROL 250-50 MCG/DOSE IN AEPB
1.0000 | INHALATION_SPRAY | Freq: Two times a day (BID) | RESPIRATORY_TRACT | Status: DC
Start: 2011-03-28 — End: 2011-04-01
  Administered 2011-03-29 – 2011-04-01 (×7): 1 via RESPIRATORY_TRACT
  Filled 2011-03-28 (×2): qty 14

## 2011-03-28 MED ORDER — DOCUSATE SODIUM 100 MG PO CAPS
100.0000 mg | ORAL_CAPSULE | Freq: Two times a day (BID) | ORAL | Status: DC
  Administered 2011-03-28 – 2011-04-01 (×9): 100 mg via ORAL
  Filled 2011-03-28 (×11): qty 1

## 2011-03-28 MED ORDER — ROCURONIUM BROMIDE 100 MG/10ML IV SOLN
INTRAVENOUS | Status: DC | PRN
  Administered 2011-03-28: 10 mg via INTRAVENOUS
  Administered 2011-03-28: 50 mg via INTRAVENOUS
  Administered 2011-03-28 (×2): 10 mg via INTRAVENOUS

## 2011-03-28 MED ORDER — PANTOPRAZOLE SODIUM 40 MG PO TBEC
40.0000 mg | DELAYED_RELEASE_TABLET | Freq: Every day | ORAL | Status: DC
  Administered 2011-03-29 – 2011-04-01 (×4): 40 mg via ORAL
  Filled 2011-03-28 (×6): qty 1

## 2011-03-28 MED ORDER — SENNA 8.6 MG PO TABS
1.0000 | ORAL_TABLET | Freq: Two times a day (BID) | ORAL | Status: DC
  Administered 2011-03-28 – 2011-04-01 (×9): 8.6 mg via ORAL
  Filled 2011-03-28 (×10): qty 1

## 2011-03-28 MED ORDER — METOCLOPRAMIDE HCL 10 MG PO TABS: 5.0000 mg | ORAL_TABLET | Freq: Three times a day (TID) | ORAL | Status: DC | PRN

## 2011-03-28 MED ORDER — LACTATED RINGERS IV SOLN
INTRAVENOUS | Status: DC | PRN
  Administered 2011-03-28 (×2): via INTRAVENOUS

## 2011-03-28 MED ORDER — CEFAZOLIN SODIUM 1-5 GM-% IV SOLN
INTRAVENOUS | Status: DC | PRN
  Administered 2011-03-28: 2 g via INTRAVENOUS

## 2011-03-28 MED ORDER — ONDANSETRON HCL 4 MG/2ML IJ SOLN: 4.0000 mg | Freq: Four times a day (QID) | INTRAMUSCULAR | Status: DC | PRN

## 2011-03-28 MED ORDER — ACETAMINOPHEN 650 MG RE SUPP: 650.0000 mg | Freq: Four times a day (QID) | RECTAL | Status: DC | PRN

## 2011-03-28 MED ORDER — NEOSTIGMINE METHYLSULFATE 1 MG/ML IJ SOLN
INTRAMUSCULAR | Status: DC | PRN
  Administered 2011-03-28: 3 mg via INTRAVENOUS

## 2011-03-28 MED ORDER — ONDANSETRON HCL 4 MG/2ML IJ SOLN: 4.0000 mg | Freq: Once | INTRAMUSCULAR | Status: DC | PRN

## 2011-03-28 MED ORDER — LIDOCAINE HCL (CARDIAC) 20 MG/ML IV SOLN
INTRAVENOUS | Status: DC | PRN
  Administered 2011-03-28: 50 mg via INTRAVENOUS

## 2011-03-28 MED ORDER — ACETAMINOPHEN 10 MG/ML IV SOLN
INTRAVENOUS | Status: AC
  Filled 2011-03-28: qty 100

## 2011-03-28 MED ORDER — SENNOSIDES-DOCUSATE SODIUM 8.6-50 MG PO TABS
1.0000 | ORAL_TABLET | Freq: Every evening | ORAL | Status: DC | PRN
  Administered 2011-03-31: 1 via ORAL
  Filled 2011-03-28: qty 1

## 2011-03-28 MED ORDER — HYDROCHLOROTHIAZIDE 25 MG PO TABS
25.0000 mg | ORAL_TABLET | Freq: Every day | ORAL | Status: DC
  Administered 2011-03-28 – 2011-04-01 (×5): 25 mg via ORAL
  Filled 2011-03-28 (×5): qty 1

## 2011-03-28 MED ORDER — AMLODIPINE BESYLATE 5 MG PO TABS
5.0000 mg | ORAL_TABLET | Freq: Every day | ORAL | Status: DC
  Administered 2011-03-29 – 2011-04-01 (×4): 5 mg via ORAL
  Filled 2011-03-28 (×5): qty 1

## 2011-03-28 MED ORDER — SODIUM CHLORIDE 0.9 % IV SOLN
INTRAVENOUS | Status: DC
  Administered 2011-03-28: 15:00:00 via INTRAVENOUS

## 2011-03-28 MED ORDER — HYDROMORPHONE HCL PF 1 MG/ML IJ SOLN
0.5000 mg | INTRAMUSCULAR | Status: DC | PRN
  Administered 2011-03-28: 0.5 mg via INTRAVENOUS
  Administered 2011-03-28: 1 mg via INTRAVENOUS
  Administered 2011-03-29: 0.5 mg via INTRAVENOUS
  Filled 2011-03-28 (×3): qty 1

## 2011-03-28 MED ORDER — ALBUMIN HUMAN 5 % IV SOLN
12.5000 g | Freq: Once | INTRAVENOUS | Status: AC
  Administered 2011-03-28: 12.5 g via INTRAVENOUS

## 2011-03-28 MED ORDER — SODIUM CHLORIDE 0.9 % IR SOLN
Status: DC | PRN
  Administered 2011-03-28: 1000 mL

## 2011-03-28 MED ORDER — LISINOPRIL 40 MG PO TABS
40.0000 mg | ORAL_TABLET | Freq: Every day | ORAL | Status: DC
  Administered 2011-03-29 – 2011-04-01 (×4): 40 mg via ORAL
  Filled 2011-03-28 (×5): qty 1

## 2011-03-28 MED ORDER — DEXTROSE 5 % IV SOLN
INTRAVENOUS | Status: DC | PRN
  Administered 2011-03-28: 08:00:00 via INTRAVENOUS

## 2011-03-28 MED ORDER — CEFAZOLIN SODIUM 1-5 GM-% IV SOLN
INTRAVENOUS | Status: AC
  Filled 2011-03-28: qty 100

## 2011-03-28 MED ORDER — ONDANSETRON HCL 4 MG PO TABS: 4.0000 mg | ORAL_TABLET | Freq: Four times a day (QID) | ORAL | Status: DC | PRN

## 2011-03-28 SURGICAL SUPPLY — 61 items
BLADE SAW SAG 73X25 THK (BLADE) ×1
BLADE SAW SGTL 73X25 THK (BLADE) ×1 IMPLANT
BRUSH FEMORAL CANAL (MISCELLANEOUS) IMPLANT
CLOTH BEACON ORANGE TIMEOUT ST (SAFETY) ×2 IMPLANT
COVER BACK TABLE 24X17X13 BIG (DRAPES) ×2 IMPLANT
COVER SURGICAL LIGHT HANDLE (MISCELLANEOUS) ×4 IMPLANT
DRAPE INCISE IOBAN 66X45 STRL (DRAPES) ×2 IMPLANT
DRAPE ORTHO SPLIT 77X108 STRL (DRAPES) ×2
DRAPE SURG ORHT 6 SPLT 77X108 (DRAPES) ×2 IMPLANT
DRAPE U-SHAPE 47X51 STRL (DRAPES) ×2 IMPLANT
DRSG MEPILEX BORDER 4X12 (GAUZE/BANDAGES/DRESSINGS) ×2 IMPLANT
DRSG MEPILEX BORDER 4X8 (GAUZE/BANDAGES/DRESSINGS) IMPLANT
DURAPREP 26ML APPLICATOR (WOUND CARE) ×2 IMPLANT
ELECT BLADE 4.0 EZ CLEAN MEGAD (MISCELLANEOUS) ×2
ELECT CAUTERY BLADE 6.4 (BLADE) ×2 IMPLANT
ELECT REM PT RETURN 9FT ADLT (ELECTROSURGICAL) ×2
ELECTRODE BLDE 4.0 EZ CLN MEGD (MISCELLANEOUS) ×1 IMPLANT
ELECTRODE REM PT RTRN 9FT ADLT (ELECTROSURGICAL) ×1 IMPLANT
EVACUATOR 1/8 PVC DRAIN (DRAIN) IMPLANT
FACESHIELD LNG OPTICON STERILE (SAFETY) ×4 IMPLANT
GLOVE BIO SURGEON STRL SZ8.5 (GLOVE) ×2 IMPLANT
GLOVE BIOGEL PI IND STRL 8 (GLOVE) ×2 IMPLANT
GLOVE BIOGEL PI INDICATOR 8 (GLOVE) ×2
GLOVE ORTHO TXT STRL SZ7.5 (GLOVE) ×2 IMPLANT
GLOVE SURG ORTHO 8.0 STRL STRW (GLOVE) ×4 IMPLANT
GLOVE SURG SS PI 7.0 STRL IVOR (GLOVE) ×2 IMPLANT
GLOVE SURG SS PI 8.5 STRL IVOR (GLOVE) ×1
GLOVE SURG SS PI 8.5 STRL STRW (GLOVE) ×1 IMPLANT
GOWN PREVENTION PLUS XLARGE (GOWN DISPOSABLE) ×2 IMPLANT
GOWN PREVENTION PLUS XXLARGE (GOWN DISPOSABLE) ×4 IMPLANT
GOWN STRL NON-REIN LRG LVL3 (GOWN DISPOSABLE) ×2 IMPLANT
HANDPIECE INTERPULSE COAX TIP (DISPOSABLE)
IMMOBILIZER KNEE 20 (SOFTGOODS)
IMMOBILIZER KNEE 20 THIGH 36 (SOFTGOODS) IMPLANT
IMMOBILIZER KNEE 22 UNIV (SOFTGOODS) IMPLANT
IMMOBILIZER KNEE 24 THIGH 36 (MISCELLANEOUS) IMPLANT
IMMOBILIZER KNEE 24 UNIV (MISCELLANEOUS)
KIT BASIN OR (CUSTOM PROCEDURE TRAY) ×2 IMPLANT
KIT ROOM TURNOVER OR (KITS) ×2 IMPLANT
MANIFOLD NEPTUNE II (INSTRUMENTS) ×2 IMPLANT
NEEDLE 22X1 1/2 (OR ONLY) (NEEDLE) ×2 IMPLANT
NEEDLE MAYO TROCAR (NEEDLE) ×2 IMPLANT
NS IRRIG 1000ML POUR BTL (IV SOLUTION) ×2 IMPLANT
PACK TOTAL JOINT (CUSTOM PROCEDURE TRAY) ×2 IMPLANT
PAD ARMBOARD 7.5X6 YLW CONV (MISCELLANEOUS) ×4 IMPLANT
PILLOW ABDUCTION HIP (SOFTGOODS) ×2 IMPLANT
PRESSURIZER FEMORAL UNIV (MISCELLANEOUS) IMPLANT
SET HNDPC FAN SPRY TIP SCT (DISPOSABLE) IMPLANT
STAPLER VISISTAT 35W (STAPLE) ×2 IMPLANT
SUCTION FRAZIER TIP 10 FR DISP (SUCTIONS) ×2 IMPLANT
SUT ETHIBOND NAB CT1 #1 30IN (SUTURE) ×8 IMPLANT
SUT VIC AB 1 CTB1 27 (SUTURE) ×4 IMPLANT
SUT VIC AB 2-0 CT1 27 (SUTURE) ×2
SUT VIC AB 2-0 CT1 TAPERPNT 27 (SUTURE) ×2 IMPLANT
SYR CONTROL 10ML LL (SYRINGE) ×2 IMPLANT
TOWEL OR 17X24 6PK STRL BLUE (TOWEL DISPOSABLE) ×2 IMPLANT
TOWEL OR 17X26 10 PK STRL BLUE (TOWEL DISPOSABLE) ×2 IMPLANT
TOWER CARTRIDGE SMART MIX (DISPOSABLE) IMPLANT
TRAY FOLEY CATH 14FR (SET/KITS/TRAYS/PACK) ×2 IMPLANT
WATER STERILE IRR 1000ML POUR (IV SOLUTION) ×4 IMPLANT
YANKAUER SUCT BULB TIP NO VENT (SUCTIONS) ×2 IMPLANT

## 2011-03-28 NOTE — Anesthesia Preprocedure Evaluation (Addendum)
Anesthesia Evaluation  Patient identified by MRN, date of birth, ID band Patient awake    Reviewed: Allergy & Precautions, H&P , NPO status , Patient's Chart, lab work & pertinent test results, reviewed documented beta blocker date and time   Airway Mallampati: I TM Distance: >3 FB Neck ROM: Full    Dental  (+) Edentulous Upper, Poor Dentition and Dental Advisory Given   Pulmonary shortness of breath and with exertion, asthma ,          Cardiovascular hypertension, Pt. on medications and Pt. on home beta blockers regular Normal    Neuro/Psych    GI/Hepatic   Endo/Other    Renal/GU      Musculoskeletal   Abdominal   Peds  Hematology   Anesthesia Other Findings   Reproductive/Obstetrics                          Anesthesia Physical Anesthesia Plan  ASA: II  Anesthesia Plan: General   Post-op Pain Management:    Induction: Intravenous  Airway Management Planned: Oral ETT  Additional Equipment:   Intra-op Plan:   Post-operative Plan: Extubation in OR  Informed Consent: I have reviewed the patients History and Physical, chart, labs and discussed the procedure including the risks, benefits and alternatives for the proposed anesthesia with the patient or authorized representative who has indicated his/her understanding and acceptance.   Dental advisory given  Plan Discussed with: CRNA and Anesthesiologist  Anesthesia Plan Comments:         Anesthesia Quick Evaluation

## 2011-03-28 NOTE — Op Note (Signed)
Dictated

## 2011-03-28 NOTE — H&P (View-Only) (Signed)
 NAME: Jordan Byrd MRN: #[id number] DATE: March 13, 2011 DOB: 09/27/1933  COMPLAINT:     Follow up right hip osteoarthritis.  HPI:     The patient is a 77-year-old female who presents with a history of right hip osteoarthritis, end stage; also with hypertension, asthma, and macular degeneration, and extensive lumbar and cervical spine history with Dr. Nudelman.  She has failed conservative treatments with oral anti-inflammatories, intra-articular injection, and pain medications.  She uses a walker at baseline.  She has had previous x-rays done on 02/27/11 showing right hip severe bone-on-bone osteoarthritis.  It is significantly affecting her quality of life and activities of daily living.  She has the most pain with weightbearing.  It will awaken her from sleep at night.  She is here for further discussion of possible total hip arthroplasty today.  CURRENT MEDICATIONS:     HCTZ 25 mg daily, lisinopril 40 mg daily, amlodipine 5 mg daily, metoprolol 100 mg daily, Advair 250/50 mg 2 puffs inhaled daily, Prilosec 20 mg daily, glucosamine chondroitin, calcium 600 mg twice daily, and multivitamin.  ALLERGIES:     CODEINE, TEMARICSPAN, BUTAZDEDINALKA, ASCRINTION #3, ROBAXIN, DORIDIN, and CELEBREX.  She states Vicodin does not mix with her blood pressure medications.  Aspirin upsets her stomach.    PAST MEDICAL HISTORY:     Positive for hypertension, asthma, macular degeneration, significant cervical and lumbar history with Dr. Nudelman.  PAST SURGICAL HISTORY:     Tonsillectomy 1943, adenoids 1949, foot surgery 1947, appendectomy 1954, hysterectomy 1962, gallbladder 1984, disc surgery upper back 1990, both knees and left shoulder surgery 2008, December 2007 and January 2008 cataract surgery both eyes, and September 2010 back surgery low back.  ROS:    Systemic review is positive for cataracts, glasses, contacts, partial dentures, shortness of breath, pneumonia, bronchitis, pleurisy, high blood pressure,  asthma, ankle swelling, kidney infection, frequent urination, nocturnal urination, headaches, and weight gain. Please refer to patient information form for details.  FAMILY HISTORY:    Positive for high blood pressure, diabetes, and stroke in mother; heart disease, heart attack, high blood pressure, and diabetes in father; heart attack, high blood pressure, diabetes, kidney disease, and cancer in sister.  SOCIAL HISTORY:    The patient is a nonsmoker.  She states she smoked a pack per day for two years; stopped in 1972.  She does not drink alcohol.  She lives alone.  She has three children.  She is widowed.  EXAM:     The patient is seated in the examination room in no acute distress.  She is alert, oriented, and appears appropriate age.  HT:  5'0"   WT:  219 pounds  BMI:  38.8  TEMP:  97.0 degrees  BP:  125/77   P:  80     R:  18  Skin:   No rashes, no lesions. HEENT:   PERRLA.  She has upper dentures.  Neck is supple.  Palpable carotid pulses.     Chest:   Some upper respiratory wheezing; otherwise normal breath sounds, lungs clear to auscultation bilaterally.  Heart:   Regular rate and rhythm.  No signs of murmurs.   Abdomen:   Soft, non tender.  Active bowel sounds in all four quadrants. Rectal/Brest:    Not indicated for surgery. Neuro:   Cranial nerves grossly intact. Musculoskeletal:    Examination of the right lower extremity shows sensation intact to light touch.  Negative tenderness to palpation of the calf.  She has severe   pain of the right hip with internal and external rotation.  Flexion and extension significantly limited.   X-RAYS:     No images were obtained today.  X-rays of the right hip taken on 02/27/11 show right hip bone-on-bone severe osteoarthritis.   IMPRESSION:      1. Right hip osteoarthritis, end stage. 2. Asthma. 3. Hypertension. 4. High cholesterol.  RECOMMENDATIONS:     The risks and benefits of right total hip arthroplasty were discussed again today and the  patient wishes to proceed.  She has a preadmission appointment scheduled on 03/18/11.  Surgery is currently scheduled for 03/28/11.  She has obtained medical and cardiac clearance from her primary care physician, Dr. Edwin Green, who states he would be available for consultation while inpatient.  The patient does live alone and will require skilled nursing facility following surgery.  The patient was given preoperative instructions.    We also discussed DVT prophylaxis following surgery and perioperative antibiotics.  If she has any questions prior to surgery, she may contact the office.   Josh Baleigh Rennaker P.A.-C/[id number]  Auto-Authenticated by Josh Alisen Marsiglia P.A.-C  cc:  Edwin Green, M.D. 

## 2011-03-28 NOTE — Op Note (Signed)
NAME:  Jordan Byrd, Jordan Byrd NO.:  [id number]  MEDICAL RECORD NO.:  [id number]  LOCATION:  [facility]                         FACILITY:  [facility]  PHYSICIAN:  Dyke Brackett, M.D.    DATE OF BIRTH:  [birth year]  DATE OF PROCEDURE: DATE OF DISCHARGE:                              OPERATIVE REPORT   INDICATIONS:  A 78 year old with retractable hip pain, end-stage arthritis of the right hip noted on x-ray, not responding to conservative treatment including injection, thought to be amenable to hospitalization.  PREOPERATIVE DIAGNOSIS:  Osteoarthritis of the right hip.  POSTOPERATIVE DIAGNOSIS:  Osteoarthritis of the right hip.  OPERATION:  Depuy prodigy hip (porous-coated prodigy stem with 12.5 mm small stature, +13 mm neck length, 32 mm hip ball, and 50 mm 100 series acetabulum).  SURGEON:  Dyke Brackett, M.D.  ASSISTANT:  Margart Sickles, PA-C.  BLOOD LOSS:  500.  ANESTHESIA:  General anesthetic.  DESCRIPTION OF PROCEDURE:  The patient was positioned laterally, posterior approach to the hip was made.  We split the iliotibial band, identified the greater trochanter.  We split the gluteus maximus fascia, we identified the sciatic nerve.  The short external rotators were split with the T capsulotomy in the hip, severe acetabular erosion was noted with severe end-stage hip.  We cut the head about 1 fingerbreadth above the lesser trochanter, and then progressively reamed, and then rasped the canal to set the 12.5 mm small stature stem.  Attention was then turned to the acetabulum, wing retractors were placed superiorly and posteriorly.  We resected the labrum and the fibrofatty tissues from the hip socket, then reamed with about 20 degrees of anteversion and 45-50 degrees abduction of the cup, deepened the cup only to the level of the fovea, good bone stock was obtained.  We underreamed 1 mm 49 except the 50th cup.  The cup was inserted without difficulty with the  appropriate amount of anteversion.  Trial poly liner was placed, and then we trialed the hip all the rasped despite a nonaggressive neck cut, we really had to go up to the 13 mm neck.  We did obtain good stability with the patient able to be flexed to 90, adduction fully, and internally rotated about 30 degrees.  At that point, the hip did tend to sublux and dislocate but only in that extreme position.  The trial components were removed, and final components were inserted. We actually tried again off the final acetabulum in place, and then it was decided on the above-mentioned sizes, then the prodigy stem with the appropriate neck length and ball was placed as well.  All parameters were deemed to be acceptable as above.  Closure was then effected with #1 Ethibond, 0 and 2-0 Vicryl with the skin clips, Marcaine with epinephrine, and the skin placed in abduction pillow.     Dyke Brackett, M.D.     WDC/MEDQ  D:  03/28/2011  T:  03/28/2011  Job:  [id number]

## 2011-03-28 NOTE — Progress Notes (Signed)
Pt is s/p R THR. Hip precautions. +cms. Abduction pillow in place. Pt has a bulky dressing to R hip with ice. PAS and TED hose on. Pt is lethargic but arousable. Neuro check is otherwise negative. Pt lungs CTA but diminished throughout. Heart rate regular rate and rhythm but noted to be bradycardic in the 50's. No s/sx cardiac distress and no c/o such. Abdomen soft flat nontender and nondistended. BS faint to nonexistent x 4. Due to pt lethargy and bowel sounds, will start pt on clear liquid diet and advance as tolerated. Pt is to WBAT with RW. Pt has a foley draining clear yellow urine. Pt oriented to room, call bell, unit protocol. Pt instructed IS but pt will require further teaching due to lethargy. Family at bedside and supportive.

## 2011-03-28 NOTE — Anesthesia Postprocedure Evaluation (Signed)
  Anesthesia Post-op Note  Patient: Jordan Byrd  Procedure(s) Performed: Procedure(s) (LRB): TOTAL HIP ARTHROPLASTY (Right)  Patient Location: PACU  Anesthesia Type: General  Level of Consciousness: awake, alert  and patient cooperative  Airway and Oxygen Therapy: Patient Spontanous Breathing and Patient connected to nasal cannula oxygen  Post-op Pain: mild  Post-op Assessment: Post-op Vital signs reviewed, Patient's Cardiovascular Status Stable, Respiratory Function Stable, Patent Airway, No signs of Nausea or vomiting and Pain level controlled  Post-op Vital Signs: stable  Complications: No apparent anesthesia complications

## 2011-03-28 NOTE — Interval H&P Note (Signed)
History and Physical Interval Note:  03/28/2011 7:30 AM  Jordan Byrd  has presented today for surgery, with the diagnosis of osteoarthritis right hip  The various methods of treatment have been discussed with the patient and family. After consideration of risks, benefits and other options for treatment, the patient has consented to  Procedure(s) (LRB): TOTAL HIP ARTHROPLASTY (Right) as a surgical intervention .  The patients' history has been reviewed, patient examined, no change in status, stable for surgery.  I have reviewed the patients' chart and labs.  Questions were answered to the patient's satisfaction.     Shadie Sweatman JR,W D

## 2011-03-28 NOTE — Transfer of Care (Signed)
Immediate Anesthesia Transfer of Care Note  Patient: Jordan Byrd  Procedure(s) Performed: Procedure(s) (LRB): TOTAL HIP ARTHROPLASTY (Right)  Patient Location: PACU  Anesthesia Type: General  Level of Consciousness: awake, alert  and oriented  Airway & Oxygen Therapy: Patient Spontanous Breathing and Patient connected to nasal cannula oxygen  Post-op Assessment: Report given to PACU RN, Post -op Vital signs reviewed and stable and Patient moving all extremities  Post vital signs: Reviewed and stable  Complications: No apparent anesthesia complications

## 2011-03-28 NOTE — Preoperative (Signed)
Beta Blockers   Reason not to administer Beta Blockers:Metoprolol 22 Feb 13 @ 0400 hrs

## 2011-03-29 LAB — BASIC METABOLIC PANEL
BUN: 31 mg/dL — ABNORMAL HIGH (ref 6–23)
CO2: 24 mEq/L (ref 19–32)
Chloride: 96 mEq/L (ref 96–112)
GFR calc non Af Amer: 36 mL/min — ABNORMAL LOW (ref >90)
Glucose, Bld: 119 mg/dL — ABNORMAL HIGH (ref 70–99)
Potassium: 4.2 mEq/L (ref 3.5–5.1)
Sodium: 132 mEq/L — ABNORMAL LOW (ref 135–145)

## 2011-03-29 LAB — CBC
HCT: 29.5 % — ABNORMAL LOW (ref 36.0–46.0)
Hemoglobin: 9.6 g/dL — ABNORMAL LOW (ref 12.0–15.0)
RBC: 3.23 MIL/uL — ABNORMAL LOW (ref 3.87–5.11)
WBC: 9.6 10*3/uL (ref 4.0–10.5)

## 2011-03-29 NOTE — Progress Notes (Signed)
Jordan Spurling, MD   Altamese Cabal, PA-C [address] [place], [place], [place]  [address]                             [phone]   PROGRESS NOTE  Subjective:  negative for Chest Pain  negative for Shortness of Breath  negative for Nausea/Vomiting   negative for Calf Pain  negative for Bowel Movement   Tolerating Diet: yes         Patient reports pain as 4 on 0-10 scale.    Objective: Vital signs in last 24 hours:   Patient Vitals for the past 24 hrs:  BP Temp Temp src Pulse Resp SpO2 Height Weight  03/29/11 0500 153/54 mmHg 98.9 F (37.2 C) - 83  18  100 % - -  03/29/11 0212 116/49 mmHg 97.8 F (36.6 C) Oral 81  18  95 % - -  03/28/11 1455 118/66 mmHg 97.6 F (36.4 C) - 71  18  99 % - -  03/28/11 1315 137/45 mmHg 97.4 F (36.3 C) Oral 62  14  99 % - -  03/28/11 1300 - 96.9 F (36.1 C) - 59  13  100 % 5\' 6"  (1.676 m) 98.294 kg (216 lb 11.2 oz)  03/28/11 1245 120/52 mmHg - - 59  14  100 % - -  03/28/11 1230 118/47 mmHg - - 59  15  100 % - -  03/28/11 1215 116/48 mmHg - - 61  14  100 % - -  03/28/11 1200 119/50 mmHg - - 56  13  100 % - -  03/28/11 1145 114/53 mmHg - - 56  14  100 % - -  03/28/11 1130 112/52 mmHg - - 56  15  100 % - -  03/28/11 1115 110/49 mmHg - - 56  16  98 % - -  03/28/11 1100 105/46 mmHg - - 56  17  100 % - -  03/28/11 1047 - - - 53  17  98 % - -  03/28/11 1036 123/47 mmHg 97.7 F (36.5 C) - 99  16  99 % - -    @flow {1959:LAST@   Intake/Output from previous day:   02/22 0701 - 02/23 0700 In: 3872.5 [P.O.:240; I.V.:3182.5] Out: 1100 [Urine:600]   Intake/Output this shift:       Intake/Output      02/22 0701 - 02/23 0700 02/23 0701 - 02/24 0700   P.O. 240    I.V. (mL/kg) 3182.5 (32.4)    IV Piggyback 450    Total Intake(mL/kg) 3872.5 (39.4)    Urine (mL/kg/hr) 600 (0.3)    Blood 500    Total Output 1100    Net +2772.5            LABORATORY DATA:  Basename 03/29/11 0500  WBC 9.6  HGB 9.6*  HCT 29.5*  PLT 241    Basename  03/29/11 0500  NA 132*  K 4.2  CL 96  CO2 24  BUN 31*  CREATININE 1.39*  GLUCOSE 119*  CALCIUM 8.5   Lab Results  Component Value Date   INR 0.97 03/18/2011    Examination:  General appearance: alert, cooperative and no distress Extremities: Homans sign is negative, no sign of DVT  Wound Exam: clean, dry, intact   Drainage:  None: wound tissue dry  Motor Exam: EHL and FHL Intact  Sensory Exam: Deep Peroneal normal  Vascular Exam:  Assessment:    1 Day Post-Op  Procedure(s) (LRB): TOTAL HIP ARTHROPLASTY (Right)  ADDITIONAL DIAGNOSIS:  Active Problems:  * No active hospital problems. *   Acute Blood Loss Anemia   Plan: Physical Therapy as ordered Weight Bearing as Tolerated (WBAT)  DVT Prophylaxis:    DISCHARGE PLAN: Skilled Nursing Facility/Rehab  DISCHARGE NEEDS: HHPT, CPM, Walker and 3-in-1 comode seat         Jordan Byrd 03/29/2011, 9:03 AM

## 2011-03-29 NOTE — Progress Notes (Signed)
Occupational Therapy Evaluation Patient Details Name: Jordan Byrd MRN: [id number] DOB: 05-Oct-1933 Today's Date: 03/29/2011  Problem List: There is no problem list on file for this patient.   Past Medical History:  Past Medical History  Diagnosis Date  . Asthma   . Shortness of breath   . Arthritis   . Anxiety   . Hypertension     dr Beverely Pace   Past Surgical History:  Past Surgical History  Procedure Date  . Cardiac catheterization     over 7 yrs ago  . Abdominal hysterectomy   . Appendectomy   . Cholecystectomy   . Back surgery   . Cataract extraction bilateral w/ anterior vitrectomy   . Joint replacement     hip,knees,shoulder  . Tonsillectomy     OT Assessment/Plan/Recommendation OT Assessment Clinical Impression Statement: 78 yo s/p R THA. Pt will benefit from skilled OT services to Max indep with ADL and functional mobility for ADL  with nec AE and DME to faciliate D/C to next venue of care and return to PLOF. Pt plans to D/C to SNF for rehab. OT Recommendation/Assessment: Patient will need skilled OT in the acute care venue OT Problem List: Decreased strength;Decreased range of motion;Decreased activity tolerance;Impaired balance (sitting and/or standing);Decreased safety awareness;Decreased knowledge of use of DME or AE;Decreased knowledge of precautions;Pain Barriers to Discharge: Decreased caregiver support OT Therapy Diagnosis : Generalized weakness OT Plan OT Frequency: Min 2X/week OT Treatment/Interventions: Self-care/ADL training;DME and/or AE instruction;Therapeutic activities;Patient/family education;Balance training OT Recommendation Follow Up Recommendations: Skilled nursing facility Equipment Recommended: Defer to next venue Individuals Consulted Consulted and Agree with Results and Recommendations: Patient OT Goals Acute Rehab OT Goals OT Goal Formulation: With patient Time For Goal Achievement: 2 weeks ADL Goals Pt Will Perform Lower Body  Bathing: with mod assist;Sit to stand from bed;with adaptive equipment;with cueing (comment type and amount) ADL Goal: Lower Body Bathing - Progress: Goal set today Pt Will Perform Lower Body Dressing: with mod assist;with adaptive equipment;with cueing (comment type and amount);Sit to stand from bed ADL Goal: Lower Body Dressing - Progress: Goal set today Pt Will Transfer to Toilet: with mod assist;with DME;3-in-1;Maintaining hip precautions;with cueing (comment type and amount) ADL Goal: Toilet Transfer - Progress: Goal set today Pt Will Perform Toileting - Clothing Manipulation: with min assist;Standing;with adaptive equipment;with cueing (comment type and amount) ADL Goal: Toileting - Clothing Manipulation - Progress: Goal set today Pt Will Perform Toileting - Hygiene: with supervision;Standing at 3-in-1/toilet;with adaptive equipment;with cueing (comment type and amount) ADL Goal: Toileting - Hygiene - Progress: Goal set today ADL Goal: Additional Goal #1 - Progress: Goal set today (Goal: Demo 3/3 hip precautions during ADL with min vc.)  OT Evaluation Precautions/Restrictions  Precautions Precautions: Posterior Hip Precaution Booklet Issued: Yes (comment) Required Braces or Orthoses: No Restrictions Weight Bearing Restrictions: Yes RLE Weight Bearing: Weight bearing as tolerated Prior Functioning Home Living Lives With: Alone Receives Help From: Family Type of Home: House Home Layout: One level Home Access: Ramped entrance Bathroom Shower/Tub: Tub/shower unit;Door [employer] Toilet: Standard Bathroom Accessibility: Yes How Accessible: Accessible via walker Home Adaptive Equipment: Bedside commode/3-in-1;Walker - rolling;Straight cane Prior Function Level of Independence: Independent with basic ADLs;Independent with homemaking with ambulation;Independent with gait;Independent with transfers Able to Take Stairs?: Yes Driving: No Vocation: Retired Leisure:  Hobbies-no ADL ADL Eating/Feeding: Simulated;Independent Where Assessed - Eating/Feeding: Edge of bed Grooming: Simulated;Set up Where Assessed - Grooming: Supine, head of bed up Upper Body Bathing: Simulated;Set up Where Assessed - Upper Body Bathing:  Supine, head of bed up Lower Body Bathing: Simulated;+1 Total assistance Where Assessed - Lower Body Bathing: Supine, head of bed up Upper Body Dressing: Simulated;Set up Where Assessed - Upper Body Dressing: Supine, head of bed up Lower Body Dressing: Simulated;+1 Total assistance Where Assessed - Lower Body Dressing: Supine, head of bed up Toilet Transfer: Not assessed Toilet Transfer Method:  (pt declined transfer. will attempt tomorrow.) Vision/Perception    Cognition Cognition Arousal/Alertness: Awake/alert Overall Cognitive Status: Appears within functional limits for tasks assessed Orientation Level: Oriented X4 Sensation/Coordination Sensation Light Touch: Not tested Coordination Gross Motor Movements are Fluid and Coordinated: Yes Fine Motor Movements are Fluid and Coordinated: Yes Extremity Assessment RUE Assessment RUE Assessment: Not tested LUE Assessment LUE Assessment: Not tested Mobility  Bed Mobility Bed Mobility: Yes Rolling Left: 2: Max assist Transfers Sit to Stand: 2: Max assist;With upper extremity assist;From bed Stand to Sit: 3: Mod assist;To chair/3-in-1;With armrests Exercises   End of Session OT - End of Session Activity Tolerance: Patient limited by pain;Patient limited by fatigue Patient left: in bed;with call bell in reach;with family/visitor present General Behavior During Session: [facility] for tasks performed Cognition: [facility] for tasks performed   [facility] 03/29/2011, 6:02 PM  [facility], OTR/L  [phone] 03/29/2011

## 2011-03-29 NOTE — Evaluation (Signed)
Physical Therapy Evaluation Patient Details Name: Jordan Byrd MRN: [id number] DOB: 30-Jan-1934 Today's Date: 03/29/2011  Problem List: There is no problem list on file for this patient.   Past Medical History:  Past Medical History  Diagnosis Date  . Asthma   . Shortness of breath   . Arthritis   . Anxiety   . Hypertension     dr Beverely Pace   Past Surgical History:  Past Surgical History  Procedure Date  . Cardiac catheterization     over 7 yrs ago  . Abdominal hysterectomy   . Appendectomy   . Cholecystectomy   . Back surgery   . Cataract extraction bilateral w/ anterior vitrectomy   . Joint replacement     hip,knees,shoulder  . Tonsillectomy     PT Assessment/Plan/Recommendation PT Assessment Clinical Impression Statement: Pt is a84 y/o female s/p Rt THA. Pt requires Max assist with bed mobility and transfers at this time.  PT agrees with plan to d/c to short term SNF.  Acute PT will continue to follow pt to maximize independence.  PT Recommendation/Assessment: Patient will need skilled PT in the acute care venue PT Problem List: Decreased strength;Decreased range of motion;Decreased activity tolerance;Decreased mobility;Decreased knowledge of use of DME;Decreased safety awareness;Decreased knowledge of precautions;Pain Barriers to Discharge: Decreased caregiver support PT Therapy Diagnosis : Difficulty walking;Generalized weakness;Acute pain PT Plan PT Frequency: 7X/week PT Treatment/Interventions: Gait training;Therapeutic exercise;Therapeutic activities;Functional mobility training;Patient/family education;DME instruction PT Recommendation Follow Up Recommendations: Skilled nursing facility Equipment Recommended: Defer to next venue PT Goals  Acute Rehab PT Goals PT Goal Formulation: With patient/family Time For Goal Achievement: 2 weeks Pt will Roll Supine to Right Side: with modified independence PT Goal: Rolling Supine to Right Side - Progress: Goal set  today Pt will go Supine/Side to Sit: with modified independence PT Goal: Supine/Side to Sit - Progress: Goal set today Pt will go Sit to Stand: with modified independence PT Goal: Sit to Stand - Progress: Goal set today Pt will go Stand to Sit: with modified independence PT Goal: Stand to Sit - Progress: Goal set today Pt will Transfer Bed to Chair/Chair to Bed: with supervision PT Transfer Goal: Bed to Chair/Chair to Bed - Progress: Goal set today Pt will Ambulate: 51 - 150 feet;with supervision;with rolling walker PT Goal: Ambulate - Progress: Goal set today Pt will Perform Home Exercise Program: with supervision, verbal cues required/provided PT Goal: Perform Home Exercise Program - Progress: Goal set today  PT Evaluation Precautions/Restrictions  Precautions Precautions: Posterior Hip Precaution Booklet Issued: Yes (comment) Required Braces or Orthoses: No Restrictions Weight Bearing Restrictions: Yes RLE Weight Bearing: Weight bearing as tolerated Prior Functioning  Home Living Lives With: Alone Receives Help From: Family Type of Home: House Home Layout: One level Home Access: Ramped entrance Bathroom Shower/Tub: Tub/shower unit;Door [employer] Toilet: Standard Bathroom Accessibility: Yes How Accessible: Accessible via walker Home Adaptive Equipment: Bedside commode/3-in-1;Walker - rolling;Straight cane Prior Function Level of Independence: Independent with basic ADLs;Independent with homemaking with ambulation;Independent with gait;Independent with transfers Able to Take Stairs?: Yes Driving: No Vocation: Retired Leisure: Hobbies-no Cognition Cognition Arousal/Alertness: Awake/alert Overall Cognitive Status: Appears within functional limits for tasks assessed Orientation Level: Oriented X4 Sensation/Coordination Sensation Light Touch: Not tested Coordination Gross Motor Movements are Fluid and Coordinated: Yes Extremity Assessment RUE Assessment RUE Assessment:  Not tested LUE Assessment LUE Assessment: Not tested RLE Assessment RLE Assessment: Exceptions to [facility] RLE Strength RLE Overall Strength: Deficits;Due to pain LLE Assessment LLE Assessment: Within Functional Limits Mobility (including  Balance) Bed Mobility Bed Mobility: Yes Rolling Left: 2: Max assist Rolling Left Details (indicate cue type and reason): cues for technique max assist to manage Rt LE secondary to pain.   Transfers Transfers: Yes Sit to Stand: 2: Max assist;With upper extremity assist;From bed Sit to Stand Details (indicate cue type and reason): Cues for hand placement and WBAT on Right LE.  Assist to shift wt anteriorly and support pt's wt due to weakness.   Stand to Sit: 3: Mod assist;To chair/3-in-1;With armrests Stand to Sit Details: Assist to position hands on armrest of chair and to position Rt LE to minimize pain. Assist for controlled descent as pt attempts to fall into the chair.   Stand Pivot Transfers: 2: Max Actuary Details (indicate cue type and reason): Verbal and tactile cues for sequencing,Assist to manage walker  maintain standing balance, initiate pivot on Left LE.  Verbal cues for WBAT on Rt LE.   Ambulation/Gait Ambulation/Gait: Yes Ambulation/Gait Assistance: 2: Max assist Ambulation/Gait Assistance Details (indicate cue type and reason): Pt required max assist to advance Rt LE and L LE secondary to increasing pain in Rt hip. RN asked pt if she would like pain meds at that time and pt replied no.   Ambulation Distance (Feet): 5 Feet Assistive device: Rolling walker Gait Pattern: Step-to pattern;Decreased stance time - right;Decreased step length - left;Decreased weight shift to right Stairs: No Wheelchair Mobility Wheelchair Mobility: No  Posture/Postural Control Posture/Postural Control: No significant limitations Exercise    End of Session PT - End of Session Equipment Utilized During Treatment: Gait belt Activity  Tolerance: Patient limited by fatigue;Patient limited by pain Patient left: in chair;with call bell in reach Nurse Communication: Mobility status for transfers;Mobility status for ambulation General Behavior During Session: [facility] for tasks performed Cognition: [facility] for tasks performed  Tayllor Breitenstein 03/29/2011, 1:00 PM Lot Medford L. Jaxx Huish DPT [phone]

## 2011-03-30 LAB — CBC
HCT: 27.6 % — ABNORMAL LOW (ref 36.0–46.0)
Hemoglobin: 9.1 g/dL — ABNORMAL LOW (ref 12.0–15.0)
MCH: 29.6 pg (ref 26.0–34.0)
RBC: 3.07 MIL/uL — ABNORMAL LOW (ref 3.87–5.11)

## 2011-03-30 MED ORDER — BISACODYL 10 MG RE SUPP: 10.0000 mg | Freq: Every day | RECTAL | Status: DC | PRN

## 2011-03-30 MED ORDER — ENOXAPARIN SODIUM 30 MG/0.3ML ~~LOC~~ SOLN: 30.0000 mg | Freq: Two times a day (BID) | SUBCUTANEOUS | Status: DC

## 2011-03-30 MED ORDER — SENNOSIDES-DOCUSATE SODIUM 8.6-50 MG PO TABS: 1.0000 | ORAL_TABLET | Freq: Every evening | ORAL | Status: DC | PRN

## 2011-03-30 MED ORDER — FLEET ENEMA 7-19 GM/118ML RE ENEM: 1.0000 | ENEMA | Freq: Once | RECTAL | Status: AC | PRN

## 2011-03-30 NOTE — Progress Notes (Signed)
Occupational Therapy Treatment Patient Details Name: Jordan Byrd MRN: [id number] DOB: Jan 26, 1934 Today's Date: 03/30/2011  OT Assessment/Plan OT Assessment/Plan Comments on Treatment Session: Making slow progress. Will need SNF. OT Plan: Discharge plan remains appropriate OT Frequency: Min 2X/week Follow Up Recommendations: Skilled nursing facility Equipment Recommended: Defer to next venue OT Goals Acute Rehab OT Goals OT Goal Formulation: With patient Time For Goal Achievement: 2 weeks ADL Goals Pt Will Perform Lower Body Bathing: with mod assist;Sit to stand from bed;with adaptive equipment;with cueing (comment type and amount) ADL Goal: Lower Body Bathing - Progress: Progressing toward goals Pt Will Perform Lower Body Dressing: with mod assist;with adaptive equipment;with cueing (comment type and amount);Sit to stand from bed ADL Goal: Lower Body Dressing - Progress: Progressing toward goals Pt Will Transfer to Toilet: with mod assist;with DME;3-in-1;Maintaining hip precautions;with cueing (comment type and amount) ADL Goal: Toilet Transfer - Progress: Progressing toward goals Pt Will Perform Toileting - Clothing Manipulation: with min assist;Standing;with adaptive equipment;with cueing (comment type and amount) ADL Goal: Toileting - Clothing Manipulation - Progress: Progressing toward goals Pt Will Perform Toileting - Hygiene: with supervision;Standing at 3-in-1/toilet;with adaptive equipment;with cueing (comment type and amount) ADL Goal: Toileting - Hygiene - Progress: Progressing toward goals  OT Treatment Precautions/Restrictions  Precautions Precautions: Posterior Hip Restrictions Weight Bearing Restrictions: Yes RLE Weight Bearing: Weight bearing as tolerated   ADL ADL Lower Body Dressing: Simulated;+1 Total assistance Toilet Transfer: +2 Total assistance Toilet Transfer Method: Stand pivot Toilet Transfer Equipment: Bedside commode Toileting - Clothing  Manipulation: Maximal assistance Where Assessed - Glass blower/designer Manipulation: Standing Toileting - Hygiene: Maximal assistance Where Assessed - Toileting Hygiene: Standing Tub/Shower Transfer: Not assessed Mobility  Bed Mobility Supine to Sit: 1: +2 Total assist;Patient percentage (comment) (pt=60%) Supine to Sit Details (indicate cue type and reason): step-by-step cues for technique and post hip prec; Able to unwiegh hips (though minimally) to half-bridge to EOB Transfers Sit to Stand: 1: +2 Total assist;Patient percentage (comment);From bed;With upper extremity assist (pt=65%) Sit to Stand Details (indicate cue type and reason): cues for safety, hand placement, and to use stronger, non-operative LE mostly for sit to stand; cues for prec Stand to Sit: 3: Mod assist;To chair/3-in-1 Stand to Sit Details: Cues for safety,  control, and hand placement; pt sat quickly without using UEs to control descent despite cues; pt very anxious with upright activity and sat pretty impulsively Exercises    End of Session OT - End of Session Equipment Utilized During Treatment: Gait belt Activity Tolerance: Patient limited by pain;Patient limited by fatigue General Behavior During Session: [facility] for tasks performed Cognition: [facility] for tasks performed  Carley Strickling,HILLARY  03/30/2011, 1:13 PM [facility], OTR/L  [phone] 03/30/2011

## 2011-03-30 NOTE — Progress Notes (Signed)
Physical Therapy Treatment Patient Details Name: Jordan Byrd MRN: [id number] DOB: [birth year] Today's Date: 03/30/2011  PT Assessment/Plan  PT - Assessment/Plan Comments on Treatment Session: Improving activity tolerance and participation today; Needs reinforcement oof Post hip prec PT Plan: Discharge plan remains appropriate PT Frequency: 7X/week Follow Up Recommendations: Skilled nursing facility Equipment Recommended: Defer to next venue PT Goals  Acute Rehab PT Goals Time For Goal Achievement: 2 weeks Pt will Roll Supine to Right Side: with modified independence PT Goal: Rolling Supine to Right Side - Progress: Other (comment) Pt will go Supine/Side to Sit: with modified independence PT Goal: Supine/Side to Sit - Progress: Progressing toward goal Pt will go Sit to Stand: with modified independence PT Goal: Sit to Stand - Progress: Progressing toward goal Pt will go Stand to Sit: with modified independence PT Goal: Stand to Sit - Progress: Progressing toward goal Pt will Transfer Bed to Chair/Chair to Bed: with supervision PT Transfer Goal: Bed to Chair/Chair to Bed - Progress: Other (comment) Pt will Ambulate: 51 - 150 feet;with supervision;with rolling walker PT Goal: Ambulate - Progress: Progressing toward goal Pt will Perform Home Exercise Program: with supervision, verbal cues required/provided PT Goal: Perform Home Exercise Program - Progress: Other (comment)  PT Treatment Precautions/Restrictions  Precautions Precautions: Posterior Hip Precaution Booklet Issued: Yes (comment) Required Braces or Orthoses: No Restrictions Weight Bearing Restrictions: Yes RLE Weight Bearing: Weight bearing as tolerated Mobility (including Balance) Bed Mobility Supine to Sit: 1: +2 Total assist;Patient percentage (comment) (pt=60%) Supine to Sit Details (indicate cue type and reason): step-by-step cues for technique and post hip prec; Able to unwiegh hips (though minimally) to  half-bridge to EOB Transfers Sit to Stand: 1: +2 Total assist;Patient percentage (comment);From bed;With upper extremity assist (pt=65%) Sit to Stand Details (indicate cue type and reason): cues for safety, hand placement, and to use stronger, non-operative LE mostly for sit to stand; cues for prec Stand to Sit: 3: Mod assist;To chair/3-in-1 Stand to Sit Details: Cues for safety,  control, and hand placement; pt sat quickly without using UEs to control descent despite cues; pt very anxious with upright activity and sat pretty impulsively Ambulation/Gait Ambulation/Gait Assistance: 1: +2 Total assist;Patient percentage (comment) (pt=65%) Ambulation/Gait Assistance Details (indicate cue type and reason): cues for sequence, and to unweigh painful RLE by pressing down into RW Ambulation Distance (Feet): 8 Feet Assistive device: Rolling walker Gait Pattern: Step-to pattern       End of Session PT - End of Session Equipment Utilized During Treatment: Gait belt Activity Tolerance:  (limited by anxiety) Patient left: in chair;with call bell in reach Nurse Communication: Mobility status for transfers;Mobility status for ambulation General Behavior During Session: [facility] for tasks performed Cognition: [facility] for tasks performed  Van Clines [facility] [place], [place] [phone]  03/30/2011, 11:38 AM

## 2011-03-30 NOTE — Progress Notes (Signed)
  Jordan Spurling, MD   Altamese Cabal, PA-C [address] [place], [place], [place]  [address]                             [phone]   PROGRESS NOTE  Subjective:  negative for Chest Pain  negative for Shortness of Breath  negative for Nausea/Vomiting   negative for Calf Pain  negative for Bowel Movement   Tolerating Diet: yes         Patient reports pain as 5 on 0-10 scale.    Objective: Vital signs in last 24 hours:   Patient Vitals for the past 24 hrs:  BP Temp Temp src Pulse Resp SpO2  03/30/11 0820 - - - - - 90 %  03/30/11 0600 154/50 mmHg 99.8 F (37.7 C) Oral 89  20  92 %  03/29/11 2228 - 99.4 F (37.4 C) Oral - - -  03/29/11 2103 151/55 mmHg 101.2 F (38.4 C) Oral 104  18  95 %  03/29/11 2047 - - - - - 94 %  03/29/11 1300 155/66 mmHg 98.3 F (36.8 C) Oral 99  18  100 %    @flow {1959:LAST@   Intake/Output from previous day:   02/23 0701 - 02/24 0700 In: 1400 [P.O.:1400] Out: 400 [Urine:400]   Intake/Output this shift:       Intake/Output      02/23 0701 - 02/24 0700 02/24 0701 - 02/25 0700   P.O. 1400    I.V. (mL/kg)     IV Piggyback     Total Intake(mL/kg) 1400 (14.2)    Urine (mL/kg/hr) 400 (0.2)    Blood     Total Output 400    Net +1000         Emesis Occurrence 6 x       LABORATORY DATA:  Basename 03/30/11 0545 03/29/11 0500  WBC 10.8* 9.6  HGB 9.1* 9.6*  HCT 27.6* 29.5*  PLT 221 241    Basename 03/29/11 0500  NA 132*  K 4.2  CL 96  CO2 24  BUN 31*  CREATININE 1.39*  GLUCOSE 119*  CALCIUM 8.5   Lab Results  Component Value Date   INR 0.97 03/18/2011    Examination:  General appearance: alert, cooperative and no distress Extremities: extremities normal, atraumatic, no cyanosis or edema  Wound Exam: clean, dry, intact   Drainage:  Scant/small amount Serosanguinous exudate  Motor Exam: EHL and FHL Intact  Sensory Exam: Deep Peroneal normal  Vascular Exam:    Assessment:    2 Days Post-Op  Procedure(s) (LRB): TOTAL  HIP ARTHROPLASTY (Right)  ADDITIONAL DIAGNOSIS:  Active Problems:  * No active hospital problems. *   Acute Blood Loss Anemia   Plan: Physical Therapy as ordered Weight Bearing as Tolerated (WBAT)  DVT Prophylaxis:  Lovenox  DISCHARGE PLAN: Skilled Nursing Facility/Rehab  DISCHARGE NEEDS: HHPT, CPM, Walker and 3-in-1 comode seat         Kaion Tisdale 03/30/2011, 10:12 AM

## 2011-03-30 NOTE — Progress Notes (Signed)
Physical Therapy Treatment Patient Details Name: Jordan Byrd MRN: [id number] DOB: 1933-06-29 Today's Date: 03/30/2011  PT Assessment/Plan  PT - Assessment/Plan Comments on Treatment Session: Good gluteal activation with bridging PT Plan: Discharge plan remains appropriate PT Frequency: 7X/week Follow Up Recommendations: Skilled nursing facility Equipment Recommended: Defer to next venue PT Goals  Acute Rehab PT Goals Time For Goal Achievement: 2 weeks Pt will Roll Supine to Right Side: with modified independence PT Goal: Rolling Supine to Right Side - Progress:  (NT) Pt will go Supine/Side to Sit: with modified independence PT Goal: Supine/Side to Sit - Progress:  (NT) Pt will go Sit to Stand: with modified independence PT Goal: Sit to Stand - Progress:  (NT) Pt will go Stand to Sit: with modified independence PT Goal: Stand to Sit - Progress:  (NT) Pt will Transfer Bed to Chair/Chair to Bed:  (NT) PT Transfer Goal: Bed to Chair/Chair to Bed - Progress:  (NT) Pt will Ambulate: 51 - 150 feet;with supervision;with rolling walker PT Goal: Ambulate - Progress:  (NT) Pt will Perform Home Exercise Program: with supervision, verbal cues required/provided PT Goal: Perform Home Exercise Program - Progress: Progressing toward goal  PT Treatment Precautions/Restrictions  Precautions Precautions: Posterior Hip Precaution Booklet Issued: Yes (comment) Precaution Comments: Pt able to state 3/3 Post Hip Prec Required Braces or Orthoses: No Restrictions Weight Bearing Restrictions: Yes RLE Weight Bearing: Weight bearing as tolerated   Exercise  Total Joint Exercises Ankle Circles/Pumps: AROM;Both;10 reps;Supine Quad Sets: AROM;Right;15 reps;Supine Gluteal Sets: AROM;Both;10 reps;Supine Towel Squeeze: AROM;Both;10 reps;Supine Heel Slides: AAROM;Right;10 reps;Supine Hip ABduction/ADduction: AAROM;Right;10 reps;Supine Bridges: AROM;Both;10 reps;Supine (assist to steady feet on  bed) End of Session PT - End of Session Activity Tolerance: Patient tolerated treatment well Patient left: in bed;with call bell in reach General Behavior During Session: [facility] for tasks performed Cognition: [facility] for tasks performed  Van Clines [facility] [place], [place] [phone]  03/30/2011, 3:34 PM

## 2011-03-31 ENCOUNTER — Encounter (HOSPITAL_COMMUNITY): Payer: Self-pay | Admitting: Orthopedic Surgery

## 2011-03-31 LAB — CBC
HCT: 27.1 % — ABNORMAL LOW (ref 36.0–46.0)
MCH: 29.3 pg (ref 26.0–34.0)
MCV: 89.1 fL (ref 78.0–100.0)
RDW: 13.9 % (ref 11.5–15.5)
WBC: 10.6 10*3/uL — ABNORMAL HIGH (ref 4.0–10.5)

## 2011-03-31 MED ORDER — ZOLPIDEM TARTRATE 5 MG PO TABS
5.0000 mg | ORAL_TABLET | Freq: Once | ORAL | Status: AC
  Administered 2011-03-31: 5 mg via ORAL
  Filled 2011-03-31: qty 1

## 2011-03-31 NOTE — Progress Notes (Signed)
Subjective: 3 Days Post-Op Procedure(s) (LRB): TOTAL HIP ARTHROPLASTY (Right) Patient reports pain as mild.  No nausea today, tolerating oxycodone prn pain.  Denies dizziness, or lightheadedness.  No BM yet.  Objective: Vital signs in last 24 hours: Temp:  [98.3 F (36.8 C)-100.3 F (37.9 C)] 99.3 F (37.4 C) (02/25 0557) Pulse Rate:  [78-84] 78  (02/25 1039) Resp:  [20] 20  (02/25 0557) BP: (102-131)/(45-58) 131/45 mmHg (02/25 1039) SpO2:  [90 %-97 %] 97 % (02/25 1157)  Intake/Output from previous day: 02/24 0701 - 02/25 0700 In: 480 [P.O.:480] Out: -  Intake/Output this shift: Total I/O In: 240 [P.O.:240] Out: -    Basename 03/31/11 0635 03/30/11 0545 03/29/11 0500  HGB 8.9* 9.1* 9.6*    Basename 03/31/11 0635 03/30/11 0545  WBC 10.6* 10.8*  RBC 3.04* 3.07*  HCT 27.1* 27.6*  PLT 223 221    Basename 03/29/11 0500  NA 132*  K 4.2  CL 96  CO2 24  BUN 31*  CREATININE 1.39*  GLUCOSE 119*  CALCIUM 8.5   No results found for this basename: LABPT:2,INR:2 in the last 72 hours  Neurovascular intact Intact pulses distally Dorsiflexion/Plantar flexion intact Incision: moderate drainage No cellulitis present Dressing changed, moderate amount of serosanguinous drainage with dressing change  Assessment/Plan: 3 Days Post-Op Procedure(s) (LRB): TOTAL HIP ARTHROPLASTY (Right) Up with therapy D/C planning SNF when available Address constipation Monitor drainage from incision  Leo Weyandt 03/31/2011, 1:32 PM

## 2011-03-31 NOTE — Progress Notes (Signed)
UR COMPLETED  

## 2011-03-31 NOTE — Progress Notes (Signed)
Physical Therapy Note   03/31/11 1343  PT Visit Information  Last PT Received On 03/31/11  Precautions  Precautions Posterior Hip  Precaution Comments Today able to state 2/3 prec; required cues for no flexion of hip past 90deg  Restrictions  RLE Weight Bearing WBAT  Bed Mobility  Supine to Sit 3: Mod assist  Supine to Sit Details (indicate cue type and reason) step-by-step cues; more efficient scooting today  Transfers  Sit to Stand 3: Mod assist;From bed;With upper extremity assist  Sit to Stand Details (indicate cue type and reason) cues for safety, hipprec, and hand placement  Stand to Sit 3: Mod assist;To chair/3-in-1;With upper extremity assist  Stand to Sit Details cues for hipprec, safety, hand placement, and to control descent  Ambulation/Gait  Ambulation/Gait Assistance 3: Mod assist  Ambulation/Gait Assistance Details (indicate cue type and reason) cues for sequence, hip prec with turns; Physical assist to advance RW  Ambulation Distance (Feet) 10 Feet  Assistive device Rolling walker  Gait Pattern Step-to pattern  PT - End of Session  Equipment Utilized During Treatment Gait belt  Activity Tolerance Patient tolerated treatment well  Patient left in chair;with call bell in reach  Nurse Communication Mobility status for transfers;Mobility status for ambulation  General  Behavior During Session [facility] for tasks performed  Cognition [facility] for tasks performed  PT - Assessment/Plan  Comments on Treatment Session Imporving balance and gait distance  PT Plan Discharge plan remains appropriate  PT Frequency 7X/week  Follow Up Recommendations Skilled nursing facility  Equipment Recommended Defer to next venue  Acute Rehab PT Goals  Time For Goal Achievement 2 weeks  Pt will Roll Supine to Right Side with modified independence  PT Goal: Rolling Supine to Right Side - Progress Progressing toward goal  Pt will go Supine/Side to Sit with modified independence  PT Goal: Supine/Side  to Sit - Progress Progressing toward goal  Pt will go Sit to Stand with modified independence  PT Goal: Sit to Stand - Progress Progressing toward goal  Pt will go Stand to Sit with modified independence  PT Goal: Stand to Sit - Progress Progressing toward goal  Pt will Transfer Bed to Chair/Chair to Bed with supervision  PT Transfer Goal: Bed to Chair/Chair to Bed - Progress Other (comment)  Pt will Ambulate 51 - 150 feet;with supervision;with rolling walker  PT Goal: Ambulate - Progress Progressing toward goal  Pt will Perform Home Exercise Program with supervision, verbal cues required/provided  PT Goal: Perform Home Exercise Program - Progress Other (comment)     Van Clines, [place] [phone]

## 2011-03-31 NOTE — Progress Notes (Signed)
Physical Therapy Progress Note   03/31/11 1556  PT Visit Information  Last PT Received On 03/31/11  Precautions  Precautions Posterior Hip  Precaution Comments Able to recall 3/3 Post Hip Prec this session  Restrictions  RLE Weight Bearing WBAT  Total Joint Exercises  Ankle Circles/Pumps AROM;Both;10 reps;Supine  Quad Sets AROM;Right;10 reps;Supine  Gluteal Sets AROM;Both;10 reps;Supine  Towel Squeeze AROM;Both;10 reps;Supine  Short Arc Quad AROM;Right;10 reps;Supine  Heel Slides AAROM;Right;10 reps;Supine  Hip ABduction/ADduction AAROM;Right;10 reps;Supine  Bridges AROM;Both;10 reps;Supine  PT - End of Session  Activity Tolerance Patient tolerated treatment well  Patient left in bed;with call bell in reach  General  Behavior During Session [facility] for tasks performed  Cognition [facility] for tasks performed  PT - Assessment/Plan  Comments on Treatment Session Discussed overall improvements in mobility today  PT Plan Discharge plan remains appropriate  PT Frequency 7X/week  Follow Up Recommendations Skilled nursing facility  Equipment Recommended Defer to next venue  Acute Rehab PT Goals  PT Goal Formulation With patient/family  Time For Goal Achievement 2 weeks  Pt will Roll Supine to Right Side with modified independence  PT Goal: Rolling Supine to Right Side - Progress (NT)  Pt will go Supine/Side to Sit with modified independence  PT Goal: Supine/Side to Sit - Progress (NT)  Pt will go Sit to Stand with modified independence  PT Goal: Sit to Stand - Progress (NT)  Pt will go Stand to Sit with modified independence  PT Goal: Stand to Sit - Progress (NT)  Pt will Transfer Bed to Chair/Chair to Bed with supervision  PT Transfer Goal: Bed to Chair/Chair to Bed - Progress (NT)  Pt will Ambulate 51 - 150 feet;with supervision;with rolling walker  PT Goal: Ambulate - Progress (NT)  Pt will Perform Home Exercise Program with supervision, verbal cues required/provided  PT Goal:  Perform Home Exercise Program - Progress Progressing toward goal    [place], [place] [phone]

## 2011-03-31 NOTE — Progress Notes (Signed)
Occupational Therapy Treatment Patient Details Name: Jordan Byrd MRN: [id number] DOB: 01/23/1934 Today's Date: 03/31/2011  OT Assessment/Plan OT Assessment/Plan Comments on Treatment Session: Good participation today. Improvement with mobility. decreased c/o pain. Please have pt use BSC instead of bedpan. Min/Mod A with transfers from higher level. OT Plan: Discharge plan remains appropriate OT Frequency: Min 2X/week Follow Up Recommendations: Skilled nursing facility Equipment Recommended: Defer to next venue OT Goals Acute Rehab OT Goals OT Goal Formulation: With patient Time For Goal Achievement: 2 weeks ADL Goals Pt Will Perform Lower Body Bathing: with mod assist;Sit to stand from bed;with adaptive equipment;with cueing (comment type and amount) ADL Goal: Lower Body Bathing - Progress: Progressing toward goals Pt Will Perform Lower Body Dressing: with mod assist;with adaptive equipment;with cueing (comment type and amount);Sit to stand from bed ADL Goal: Lower Body Dressing - Progress: Progressing toward goals Pt Will Transfer to Toilet: with mod assist;with DME;3-in-1;Maintaining hip precautions;with cueing (comment type and amount) ADL Goal: Toilet Transfer - Progress: Met Pt Will Perform Toileting - Clothing Manipulation: with min assist;Standing;with adaptive equipment;with cueing (comment type and amount) ADL Goal: Toileting - Clothing Manipulation - Progress: Progressing toward goals Pt Will Perform Toileting - Hygiene: with supervision;Standing at 3-in-1/toilet;with adaptive equipment;with cueing (comment type and amount) ADL Goal: Toileting - Hygiene - Progress: Progressing toward goals  OT Treatment Precautions/Restrictions  Precautions Precautions: Posterior Hip Precaution Comments: Today able to state 2/3 prec; required cues for no flexion of hip past 90deg Restrictions RLE Weight Bearing: Weight bearing as tolerated   ADL ADL Lower Body Dressing:  Simulated;Maximal assistance (educated on use of AE and hip precautions.) Toilet Transfer: Moderate assistance Toilet Transfer Details (indicate cue type and reason): mod vc for technique. fear of fallin. Toilet Transfer Method: Proofreader: Programme researcher, broadcasting/film/video Manipulation: Maximal assistance Toileting - Clothing Manipulation Details (indicate cue type and reason): pt fearful of letting go of RW Where Assessed - Glass blower/designer Manipulation: Standing Toileting - Hygiene: Moderate assistance Where Assessed - Toileting Hygiene: Standing Tub/Shower Transfer: Not assessed Mobility  Bed Mobility Supine to Sit: 3: Mod assist Supine to Sit Details (indicate cue type and reason): step-by-step cues; more efficient scooting today Transfers Sit to Stand: 3: Mod assist;From bed;With upper extremity assist Sit to Stand Details (indicate cue type and reason): cues for safety, hipprec, and hand placement Stand to Sit: 3: Mod assist;To chair/3-in-1;With upper extremity assist Stand to Sit Details: cues for hipprec, safety, hand placement, and to control descent Exercises    End of Session OT - End of Session Equipment Utilized During Treatment: Gait belt Activity Tolerance: Patient limited by fatigue;Patient limited by pain;Other (comment) (however, increased participation) Patient left: in chair;with call bell in reach Nurse Communication: Mobility status for transfers (use of BSC) General Behavior During Session: [facility] for tasks performed Cognition: [facility] for tasks performed  [facility]  03/31/2011, 2:06 PM [facility], OTR/L  [phone] 03/31/2011

## 2011-03-31 NOTE — Progress Notes (Signed)
Clinical Social Work-CSW met with pt at bedside to discuss disposition-Yellow note and FL2 to be placed in shadow chart-CSW initiated bed search,  passarr request, and will provide pt with bed offers. Pt first choice is [facility]. CSW placing call to facilitate bed offers-Jordan Byrd-MSW, [phone]

## 2011-03-31 NOTE — Progress Notes (Signed)
CARE MANAGEMENT NOTE 03/31/2011      Action/Plan:   Discharge planning. Patient is for shortterm rehab at [facility]. will go to [facility]. Social Worker aware.   Anticipated DC Date:  03/31/2011   Anticipated DC Plan:  SKILLED NURSING FACILITY  In-house referral  Clinical Social Worker      DC Planning Services  CM consult      [facility] Choice  NA   Choice offered to / List presented to:  NA   DME arranged  NA      DME agency  NA     HH arranged  NA      HH agency  NA   Status of service:  Completed, signed off Discharge Disposition:  SKILLED NURSING FACILITY

## 2011-04-01 MED ORDER — POLYETHYLENE GLYCOL 3350 17 G PO PACK: 17.0000 g | PACK | Freq: Every day | ORAL | Status: DC | PRN

## 2011-04-01 MED ORDER — ENOXAPARIN SODIUM 30 MG/0.3ML ~~LOC~~ SOLN: 30.0000 mg | Freq: Two times a day (BID) | SUBCUTANEOUS | Status: DC

## 2011-04-01 MED ORDER — OXYCODONE HCL 5 MG PO TABS: 5.0000 mg | ORAL_TABLET | ORAL | Status: DC | PRN

## 2011-04-01 MED ORDER — FLEET ENEMA 7-19 GM/118ML RE ENEM
1.0000 | ENEMA | Freq: Every day | RECTAL | Status: DC | PRN
  Administered 2011-04-01: 1 via RECTAL
  Filled 2011-04-01: qty 1

## 2011-04-01 NOTE — Discharge Summary (Signed)
PATIENT ID:      Jordan Byrd  MRN:     [id number] DOB/AGE:    [birth year] / 78 y.o.     DISCHARGE SUMMARY  ADMISSION DATE:    03/28/2011 DISCHARGE DATE:   04/01/2011   ADMISSION DIAGNOSIS: osteoarthritis right hip  (osteoarthritis right hip)  DISCHARGE DIAGNOSIS:  osteoarthritis right hip    ADDITIONAL DIAGNOSIS: Active Problems:  * No active hospital problems. *   Past Medical History  Diagnosis Date  . Asthma   . Shortness of breath   . Arthritis   . Anxiety   . Hypertension     dr Beverely Pace    PROCEDURE: Procedure(s): TOTAL HIP ARTHROPLASTY on 03/28/2011  CONSULTS:   None  HISTORY:  See H&P in chart  HOSPITAL COURSE:  Jordan Byrd is a 78 y.o. admitted on 03/28/2011 and found to have a diagnosis of osteoarthritis right hip.  After appropriate laboratory studies were obtained  they were taken to the operating room on 03/28/2011 and underwent Procedure(s): TOTAL HIP ARTHROPLASTY.   They were given perioperative antibiotics:  Anti-infectives     Start     Dose/Rate Route Frequency Ordered Stop   03/28/11 1430   ceFAZolin (ANCEF) IVPB 1 g/50 mL premix        1 g 100 mL/hr over 30 Minutes Intravenous Every 6 hours 03/28/11 1329 03/29/11 0247        . Blood products given:none   The remainder of the hospital course was dedicated to ambulation and strengthening.   The patient was discharged on 4 Days Post-Op in  Stable condition.     DIAGNOSTIC STUDIES: Recent vital signs: Patient Vitals for the past 24 hrs:  BP Temp Pulse Resp SpO2  04/01/11 0913 - - - - 92 %  04/01/11 0549 119/63 mmHg 98.4 F (36.9 C) 77  18  95 %  03/31/11 2208 - - - - 96 %  03/31/11 2126 133/58 mmHg 100.1 F (37.8 C) 93  18  93 %  03/31/11 1342 149/68 mmHg 98.4 F (36.9 C) 86  20  95 %  03/31/11 1157 - - - - 97 %       Recent laboratory studies:  Basename 03/31/11 0635 04-27-2011 0545 03/29/11 0500  WBC 10.6* 10.8* 9.6  HGB 8.9* 9.1* 9.6*  HCT 27.1* 27.6* 29.5*  PLT 223 221 241     Basename 03/29/11 0500  NA 132*  K 4.2  CL 96  CO2 24  BUN 31*  CREATININE 1.39*  GLUCOSE 119*  CALCIUM 8.5   Lab Results  Component Value Date   INR 0.97 03/18/2011     Recent Radiographic Studies :  Dg Chest 1 View  03/28/2011  *RADIOLOGY REPORT*  Clinical Data: Preop  CHEST - 1 VIEW  Comparison: 04/15/2010  Findings: Upper normal heart size.  Clear lungs.  No pneumothorax.  IMPRESSION: No active cardiopulmonary disease.  Original Report Authenticated By: Donavan Burnet, M.D.   Dg Hip Portable 1 View Right  03/28/2011  *RADIOLOGY REPORT*  Clinical Data: Postop right total hip replacement  PORTABLE RIGHT HIP - 1 VIEW  Comparison: Pelvis film of 09/26/2010  Findings: The femoral and acetabular components of the right hip replacement are in good position.  No acute fracture is seen.  Some air is noted in the soft tissues of the right hip postoperatively.  IMPRESSION: Right total hip replacement in good position.  No acute abnormality.  Original Report Authenticated By: Juline Patch, M.D.  Mm Digital Screening  03/17/2011  DG SCREEN MAMMOGRAM BILATERAL Bilateral CC and MLO view(s) were taken.  DIGITAL SCREENING MAMMOGRAM WITH CAD: The breast tissue is heterogeneously dense.  No masses or malignant type calcifications are  identified.  Compared with prior studies.  Images were processed with CAD.  IMPRESSION: No specific mammographic evidence of malignancy.  Next screening mammogram is recommended in one  year.  A result letter of this screening mammogram will be mailed directly to the patient.  ASSESSMENT: Negative - BI-RADS 1  Screening mammogram in 1 year. ,    DISCHARGE INSTRUCTIONS:   DISCHARGE MEDICATIONS:   Medication List  As of 04/01/2011 11:45 AM   TAKE these medications         amLODipine 5 MG tablet   Commonly known as: NORVASC   Take 5 mg by mouth daily.      calcium carbonate 600 MG Tabs   Commonly known as: OS-CAL   Take 600 mg by mouth 2 (two) times daily with  a meal.      enoxaparin 30 MG/0.3ML Soln   Commonly known as: LOVENOX   Inject 0.3 mLs (30 mg total) into the skin every 12 (twelve) hours.      Fluticasone-Salmeterol 250-50 MCG/DOSE Aepb   Commonly known as: ADVAIR   Inhale 1 puff into the lungs every 12 (twelve) hours.      GLUCOSAMINE CHONDR 1500 COMPLX PO   Take 1 tablet by mouth daily.      hydrochlorothiazide 25 MG tablet   Commonly known as: HYDRODIURIL   Take 25 mg by mouth daily.      ICAPS PO   Take 1 tablet by mouth 2 (two) times daily.      lisinopril 40 MG tablet   Commonly known as: PRINIVIL,ZESTRIL   Take 40 mg by mouth daily.      metoprolol succinate 100 MG 24 hr tablet   Commonly known as: TOPROL-XL   Take 100 mg by mouth daily. Take with or immediately following a meal.      mulitivitamin with minerals Tabs   Take 1 tablet by mouth daily.      omeprazole 20 MG capsule   Commonly known as: PRILOSEC   Take 20 mg by mouth daily.      oxyCODONE 5 MG immediate release tablet   Commonly known as: Oxy IR/ROXICODONE   Take 1 tablet (5 mg total) by mouth every 3 (three) hours as needed.            FOLLOW UP VISIT:   Follow-up Information    Follow up with CAFFREY JR,W D, MD. (call to schedule f/u for March 7th)    Contact information:   [facility], [facility] [address] [place] [address] [phone]          DISPOSITION:  Skilled nursing facility    CONDITION:  Stable   Jordan Byrd 04/01/2011, 11:45 AM

## 2011-04-01 NOTE — Progress Notes (Signed)
Physical Therapy Treatment Patient Details Name: RINDI BEECHY MRN: [id number] DOB: 12-16-1933 Today's Date: 04/01/2011  PT Assessment/Plan  PT - Assessment/Plan Comments on Treatment Session: Good progress with amb, activity tolerance PT Plan: Discharge plan remains appropriate PT Frequency: 7X/week Follow Up Recommendations: Skilled nursing facility Equipment Recommended: Defer to next venue PT Goals  Acute Rehab PT Goals Time For Goal Achievement: 2 weeks Pt will Roll Supine to Right Side: with modified independence PT Goal: Rolling Supine to Right Side - Progress:  (NT) Pt will go Supine/Side to Sit: with modified independence PT Goal: Supine/Side to Sit - Progress: Progressing toward goal Pt will go Sit to Stand: with modified independence PT Goal: Sit to Stand - Progress: Progressing toward goal Pt will go Stand to Sit: with modified independence PT Goal: Stand to Sit - Progress: Progressing toward goal Pt will Transfer Bed to Chair/Chair to Bed: with supervision PT Transfer Goal: Bed to Chair/Chair to Bed - Progress:  (NT) Pt will Ambulate: 51 - 150 feet;with supervision;with rolling walker PT Goal: Ambulate - Progress: Progressing toward goal Pt will Perform Home Exercise Program: with supervision, verbal cues required/provided PT Goal: Perform Home Exercise Program - Progress:  (NT)  PT Treatment Precautions/Restrictions  Precautions Precautions: Posterior Hip Precaution Booklet Issued: Yes (comment) Precaution Comments: Able to recall 3/3 Post Hip Prec this session Required Braces or Orthoses: No Restrictions Weight Bearing Restrictions: Yes RLE Weight Bearing: Weight bearing as tolerated Mobility (including Balance) Bed Mobility Supine to Sit: 3: Mod assist Supine to Sit Details (indicate cue type and reason): continued cues for post hip prec; physical assist to scoot and square hips at EOB Transfers Sit to Stand: 3: Mod assist;From bed;With upper extremity  assist Sit to Stand Details (indicate cue type and reason): Lighter mod assist than yesterday, but continuing to need assist against gravity from bed; Min assist from commode with use of grab bar; cues for hip prec Stand to Sit: 3: Mod assist;To chair/3-in-1;With upper extremity assist Stand to Sit Details: Mod assist as pt tended to lose balance toward end of walk, and was impulsively sitting; cues  for control and hip prec Ambulation/Gait Ambulation/Gait Assistance: 4: Min assist;3: Mod assist (up to mod assist) Ambulation/Gait Assistance Details (indicate cue type and reason): pt mostly requiring min guard assist, however 2 instances of loss of balance requiring mod assist to re-steady self; cues for prec with turns Ambulation Distance (Feet): 40 Feet (bed to bathroom, then hallway, stop at sink, then to recline) Assistive device: Rolling walker Gait Pattern: Step-to pattern Stairs: No Wheelchair Mobility Wheelchair Mobility: No  Posture/Postural Control Posture/Postural Control: No significant limitations    End of Session PT - End of Session Equipment Utilized During Treatment: Gait belt Activity Tolerance: Patient tolerated treatment well Patient left: in chair;with call bell in reach Nurse Communication: Mobility status for transfers;Mobility status for ambulation General Behavior During Session: [facility] for tasks performed Cognition: [facility] for tasks performed  Van Clines [facility] [place], [place] [phone]  04/01/2011, 8:27 AM

## 2011-04-01 NOTE — Progress Notes (Signed)
Clinical Social Work-CSW contacted [facility]-they are able to provide bed offer for [facility] today-Once pt stable and d/c paperwork completed CSW will facilitate transport to [employer], [phone]

## 2011-04-01 NOTE — Progress Notes (Signed)
Subjective: 4 Days Post-Op Procedure(s) (LRB): TOTAL HIP ARTHROPLASTY (Right) Patient reports pain as mild.    Objective: Vital signs in last 24 hours: Temp:  [98.4 F (36.9 C)-100.1 F (37.8 C)] 98.4 F (36.9 C) (02/26 0549) Pulse Rate:  [77-93] 77  (02/26 0549) Resp:  [18-20] 18  (02/26 0549) BP: (119-149)/(58-68) 119/63 mmHg (02/26 0549) SpO2:  [92 %-97 %] 92 % (02/26 0913)  Intake/Output from previous day: 02/25 0701 - 02/26 0700 In: 240 [P.O.:240] Out: -  Intake/Output this shift:     Basename 03/31/11 0635 03/30/11 0545  HGB 8.9* 9.1*    Basename 03/31/11 0635 03/30/11 0545  WBC 10.6* 10.8*  RBC 3.04* 3.07*  HCT 27.1* 27.6*  PLT 223 221   No results found for this basename: NA:2,K:2,CL:2,CO2:2,BUN:2,CREATININE:2,GLUCOSE:2,CALCIUM:2 in the last 72 hours No results found for this basename: LABPT:2,INR:2 in the last 72 hours  Neurovascular intact Intact pulses distally Dorsiflexion/Plantar flexion intact Incision: dressing C/D/I  Assessment/Plan: 4 Days Post-Op Procedure(s) (LRB): TOTAL HIP ARTHROPLASTY (Right) Discharge to SNF Dressing change daily Milk of mag vs fleets for constipation Cont pain control and dvt proph   Jordan Byrd 04/01/2011, 11:20 AM

## 2011-04-01 NOTE — Progress Notes (Signed)
Clinical Social Work-CSW facilitated pt d/c to [facility]-family completing paperwork and [facility]AR transportation arranged. No further Needs-Sayra Frisby-MSW, [phone]

## 2011-04-01 NOTE — Discharge Instructions (Signed)
Diet: As you were doing prior to hospitalization  Activity:  Increase activity slowly as tolerated                  No lifting or driving for 6 weeks  Shower:  May shower starting 04/02/11   Dressing:  You may change your dressing today                                      Then change the dressing daily with sterile 4"x4"s gauze dressing                     And TED hose for knees.  Use paper tape to hold dressing in place                      Weight Bearing:  As tolerated weight bearing as taught in physical therapy.  Use a walker or                    Crutches as instructed.  Posterior hip dislocation precautions as taught in PT.  Abduction pillow while in bed between the legs.  To prevent constipation: you may use a stool softener such as -               Colace ( over the counter) 100 mg by mouth twice a day                Drink plenty of fluids ( prune juice may be helpful) and high fiber foods                Miralax ( over the counter) for constipation as needed.    Precautions:  If you experience chest pain or shortness of breath - call 911 immediately               For transfer to the hospital emergency department!!               If you develop a fever greater that 101 F, purulent drainage from wound,                             increased redness or drainage from wound, or calf pain -- Call the office at                                                 [phone].  Follow- Up Appointment:  Please call for an appointment to be seen on March 7.               [facility] - [phone]               Jonita Albee - [phone]               [place] - [phone]

## 2011-04-04 ENCOUNTER — Emergency Department (HOSPITAL_COMMUNITY): Payer: Medicare Other

## 2011-04-04 ENCOUNTER — Other Ambulatory Visit: Payer: Self-pay

## 2011-04-04 ENCOUNTER — Inpatient Hospital Stay (HOSPITAL_COMMUNITY)
Admission: EM | Admit: 2011-04-04 | Discharge: 2011-04-06 | DRG: 315 | Disposition: A | Discharge status: Skilled Nursing Facility | Payer: Medicare Other | Attending: Cardiology | Admitting: Cardiology

## 2011-04-04 ENCOUNTER — Encounter (HOSPITAL_COMMUNITY): Payer: Self-pay | Admitting: Physical Medicine and Rehabilitation

## 2011-04-04 DIAGNOSIS — Z90710 Acquired absence of both cervix and uterus: Secondary | ICD-10-CM

## 2011-04-04 DIAGNOSIS — I10 Essential (primary) hypertension: Secondary | ICD-10-CM | POA: Diagnosis present

## 2011-04-04 DIAGNOSIS — Z87891 Personal history of nicotine dependence: Secondary | ICD-10-CM

## 2011-04-04 DIAGNOSIS — Z96649 Presence of unspecified artificial hip joint: Secondary | ICD-10-CM

## 2011-04-04 DIAGNOSIS — I519 Heart disease, unspecified: Principal | ICD-10-CM | POA: Diagnosis present

## 2011-04-04 DIAGNOSIS — I4891 Unspecified atrial fibrillation: Secondary | ICD-10-CM | POA: Diagnosis present

## 2011-04-04 DIAGNOSIS — N390 Urinary tract infection, site not specified: Secondary | ICD-10-CM | POA: Diagnosis present

## 2011-04-04 DIAGNOSIS — I480 Paroxysmal atrial fibrillation: Secondary | ICD-10-CM | POA: Insufficient documentation

## 2011-04-04 DIAGNOSIS — F411 Generalized anxiety disorder: Secondary | ICD-10-CM | POA: Diagnosis present

## 2011-04-04 DIAGNOSIS — Z886 Allergy status to analgesic agent status: Secondary | ICD-10-CM

## 2011-04-04 DIAGNOSIS — F419 Anxiety disorder, unspecified: Secondary | ICD-10-CM | POA: Insufficient documentation

## 2011-04-04 DIAGNOSIS — Z9889 Other specified postprocedural states: Secondary | ICD-10-CM

## 2011-04-04 DIAGNOSIS — Y838 Other surgical procedures as the cause of abnormal reaction of the patient, or of later complication, without mention of misadventure at the time of the procedure: Secondary | ICD-10-CM | POA: Diagnosis present

## 2011-04-04 DIAGNOSIS — Z888 Allergy status to other drugs, medicaments and biological substances status: Secondary | ICD-10-CM

## 2011-04-04 DIAGNOSIS — M1990 Unspecified osteoarthritis, unspecified site: Secondary | ICD-10-CM | POA: Insufficient documentation

## 2011-04-04 HISTORY — DX: Unspecified osteoarthritis, unspecified site: M19.90

## 2011-04-04 HISTORY — DX: Unspecified atrial fibrillation: I48.91

## 2011-04-04 LAB — DIFFERENTIAL
Basophils Relative: 0 % (ref 0–1)
Eosinophils Absolute: 0.2 10*3/uL (ref 0.0–0.7)
Eosinophils Relative: 2 % (ref 0–5)
Lymphs Abs: 1.8 10*3/uL (ref 0.7–4.0)
Neutrophils Relative %: 70 % (ref 43–77)

## 2011-04-04 LAB — CBC
MCH: 29.7 pg (ref 26.0–34.0)
MCHC: 32.9 g/dL (ref 30.0–36.0)
MCV: 90.2 fL (ref 78.0–100.0)
Platelets: 476 10*3/uL — ABNORMAL HIGH (ref 150–400)

## 2011-04-04 LAB — URINE MICROSCOPIC-ADD ON

## 2011-04-04 LAB — COMPREHENSIVE METABOLIC PANEL
ALT: 70 U/L — ABNORMAL HIGH (ref 0–35)
Albumin: 2.8 g/dL — ABNORMAL LOW (ref 3.5–5.2)
Alkaline Phosphatase: 160 U/L — ABNORMAL HIGH (ref 39–117)
BUN: 23 mg/dL (ref 6–23)
Calcium: 9.3 mg/dL (ref 8.4–10.5)
GFR calc Af Amer: 41 mL/min — ABNORMAL LOW (ref >90)
Glucose, Bld: 116 mg/dL — ABNORMAL HIGH (ref 70–99)
Potassium: 3.5 mEq/L (ref 3.5–5.1)
Sodium: 138 mEq/L (ref 135–145)
Total Protein: 6.6 g/dL (ref 6.0–8.3)

## 2011-04-04 LAB — POCT I-STAT TROPONIN I: Troponin i, poc: 0.01 ng/mL (ref 0.00–0.08)

## 2011-04-04 LAB — PROTIME-INR
INR: 1.07 (ref 0.00–1.49)
Prothrombin Time: 14.1 seconds (ref 11.6–15.2)

## 2011-04-04 LAB — URINALYSIS, ROUTINE W REFLEX MICROSCOPIC
Glucose, UA: NEGATIVE mg/dL
Nitrite: NEGATIVE
Specific Gravity, Urine: 1.013 (ref 1.005–1.030)
pH: 6 (ref 5.0–8.0)

## 2011-04-04 LAB — CARDIAC PANEL(CRET KIN+CKTOT+MB+TROPI): Relative Index: INVALID (ref 0.0–2.5)

## 2011-04-04 LAB — MRSA PCR SCREENING: MRSA by PCR: NEGATIVE

## 2011-04-04 MED ORDER — METOPROLOL SUCCINATE ER 100 MG PO TB24
100.0000 mg | ORAL_TABLET | Freq: Every day | ORAL | Status: DC
  Administered 2011-04-05 – 2011-04-06 (×2): 100 mg via ORAL
  Filled 2011-04-04 (×2): qty 1

## 2011-04-04 MED ORDER — POTASSIUM CHLORIDE CRYS ER 20 MEQ PO TBCR
40.0000 meq | EXTENDED_RELEASE_TABLET | Freq: Once | ORAL | Status: AC
  Administered 2011-04-04: 40 meq via ORAL
  Filled 2011-04-04: qty 2

## 2011-04-04 MED ORDER — ALPRAZOLAM 0.25 MG PO TABS: 0.2500 mg | ORAL_TABLET | Freq: Two times a day (BID) | ORAL | Status: DC | PRN

## 2011-04-04 MED ORDER — HEPARIN (PORCINE) IN NACL 100-0.45 UNIT/ML-% IJ SOLN
1200.0000 [IU]/h | INTRAMUSCULAR | Status: DC
  Administered 2011-04-04: 1000 [IU]/h via INTRAVENOUS
  Administered 2011-04-05: 1200 [IU]/h via INTRAVENOUS
  Filled 2011-04-04 (×4): qty 250

## 2011-04-04 MED ORDER — OXYCODONE HCL 5 MG PO TABS: 5.0000 mg | ORAL_TABLET | ORAL | Status: DC | PRN

## 2011-04-04 MED ORDER — LISINOPRIL 20 MG PO TABS
40.0000 mg | ORAL_TABLET | Freq: Every day | ORAL | Status: DC
  Administered 2011-04-05 – 2011-04-06 (×2): 40 mg via ORAL
  Filled 2011-04-04: qty 2
  Filled 2011-04-04: qty 1
  Filled 2011-04-04: qty 2

## 2011-04-04 MED ORDER — DILTIAZEM HCL 100 MG IV SOLR
5.0000 mg/h | INTRAVENOUS | Status: DC
  Administered 2011-04-04: 5 mg/h via INTRAVENOUS
  Administered 2011-04-04 – 2011-04-05 (×2): 15 mg/h via INTRAVENOUS
  Filled 2011-04-04: qty 100

## 2011-04-04 MED ORDER — FLUTICASONE-SALMETEROL 250-50 MCG/DOSE IN AEPB
1.0000 | INHALATION_SPRAY | Freq: Two times a day (BID) | RESPIRATORY_TRACT | Status: DC
  Administered 2011-04-04 – 2011-04-06 (×4): 1 via RESPIRATORY_TRACT
  Filled 2011-04-04: qty 14

## 2011-04-04 MED ORDER — HEPARIN BOLUS VIA INFUSION
4000.0000 [IU] | Freq: Once | INTRAVENOUS | Status: AC
  Administered 2011-04-04: 4000 [IU] via INTRAVENOUS

## 2011-04-04 MED ORDER — DOCUSATE SODIUM 100 MG PO CAPS
100.0000 mg | ORAL_CAPSULE | Freq: Every day | ORAL | Status: DC
  Administered 2011-04-04 – 2011-04-06 (×3): 100 mg via ORAL
  Filled 2011-04-04 (×3): qty 1

## 2011-04-04 MED ORDER — SODIUM CHLORIDE 0.9 % IV BOLUS (SEPSIS)
500.0000 mL | Freq: Once | INTRAVENOUS | Status: AC
  Administered 2011-04-04: 500 mL via INTRAVENOUS

## 2011-04-04 MED ORDER — ACETAMINOPHEN 325 MG PO TABS: 650.0000 mg | ORAL_TABLET | ORAL | Status: DC | PRN

## 2011-04-04 MED ORDER — CALCIUM CARBONATE 1250 (500 CA) MG PO TABS
1250.0000 mg | ORAL_TABLET | Freq: Two times a day (BID) | ORAL | Status: DC
  Administered 2011-04-05 – 2011-04-06 (×3): 1250 mg via ORAL
  Filled 2011-04-04 (×5): qty 1

## 2011-04-04 MED ORDER — SODIUM CHLORIDE 0.9 % IJ SOLN: 3.0000 mL | INTRAMUSCULAR | Status: DC | PRN

## 2011-04-04 MED ORDER — ZOLPIDEM TARTRATE 5 MG PO TABS: 5.0000 mg | ORAL_TABLET | Freq: Every evening | ORAL | Status: DC | PRN

## 2011-04-04 MED ORDER — ADULT MULTIVITAMIN W/MINERALS CH
1.0000 | ORAL_TABLET | Freq: Every day | ORAL | Status: DC
  Administered 2011-04-05 – 2011-04-06 (×2): 1 via ORAL
  Filled 2011-04-04 (×2): qty 1

## 2011-04-04 MED ORDER — DEXTROSE 5 % IV SOLN
1.0000 g | Freq: Once | INTRAVENOUS | Status: AC
  Administered 2011-04-04: 1 g via INTRAVENOUS
  Filled 2011-04-04: qty 10

## 2011-04-04 MED ORDER — NITROGLYCERIN 0.4 MG SL SUBL: 0.4000 mg | SUBLINGUAL_TABLET | SUBLINGUAL | Status: DC | PRN

## 2011-04-04 MED ORDER — ONDANSETRON HCL 4 MG/2ML IJ SOLN: 4.0000 mg | Freq: Four times a day (QID) | INTRAMUSCULAR | Status: DC | PRN

## 2011-04-04 MED ORDER — PANTOPRAZOLE SODIUM 40 MG PO TBEC
40.0000 mg | DELAYED_RELEASE_TABLET | Freq: Every day | ORAL | Status: DC
  Administered 2011-04-05: 40 mg via ORAL
  Filled 2011-04-04: qty 1

## 2011-04-04 MED ORDER — SODIUM CHLORIDE 0.9 % IV SOLN: 250.0000 mL | INTRAVENOUS | Status: DC | PRN

## 2011-04-04 MED ORDER — SODIUM CHLORIDE 0.9 % IJ SOLN
3.0000 mL | Freq: Two times a day (BID) | INTRAMUSCULAR | Status: DC
  Administered 2011-04-05 – 2011-04-06 (×3): 3 mL via INTRAVENOUS

## 2011-04-04 NOTE — ED Notes (Signed)
Dinner tray ordered. Regular diet. 

## 2011-04-04 NOTE — H&P (Signed)
Patient ID: Jordan Byrd MRN: [id number], DOB/AGE: 02/19/1933   Admit date: 04/04/2011  Primary Physician: Enrique Sack, MD, MD Primary Cardiologist: New to [place] - being seen by Ermalene Postin, MD  Pt. Profile:  78 y/o female who recently underwent R Total Hip Arthroplasty 2/22, d/c'd 2/26, who presents with new onset afib rvr.  Problem List  Past Medical History  Diagnosis Date  . Asthma   . Shortness of breath   . Arthritis   . Anxiety   . Hypertension   . Atrial fibrillation     a. 04/04/11  . Osteoarthritis     a. 03/28/11 - R Total Hip Arthroplasty    Past Surgical History  Procedure Date  . Cardiac catheterization     1980's or 90's - reportedly normal  . Abdominal hysterectomy   . Appendectomy   . Cholecystectomy   . Back surgery   . Cataract extraction bilateral w/ anterior vitrectomy   . Joint replacement     hip,knees,shoulder  . Tonsillectomy   . Total hip arthroplasty 03/28/2011    Procedure: TOTAL HIP ARTHROPLASTY;  Surgeon: Thera Flake., MD;  Location: [facility] OR;  Service: Orthopedics;  Laterality: Right;     Allergies  Allergies  Allergen Reactions  . Aspirin Nausea And Vomiting  . Codeine Nausea And Vomiting  . Robaxin Hives  . Vicodin (Hydrocodone-Acetaminophen)     Interacts with bp medication and drops blood pressure    HPI  78 y/o female with the above problem list.  She underwent elective right THA on 2/22.  Her post-op course was uneventful and she was d/c to [facility] for rehab on 2/26.  She has been partaking in PT with steady improvement.  Of note, she has been receiving SQ lovenox bid since surgery.  This am, around 8a, pt was sitting and had sudden onset of LH/presyncope with the sensation that her head was spinning.  She feared she might pass out.  She went back to bed with improvement in Ss.  BP was checked and apparently ok, though she's unsure if her HR/rhythm was assessed.  Later in the am, while sitting and waiting to start  working with PT, she had recurrent LH/presyncope and again required assistance back to bed.  This time, she was found to be tachycardic and irregular.  She did not have any chest pain or dyspnea, diaphoresis, n, v.  Presyncope resolved once she was in bed.  HR's remained elevated and pt was taken to [facility] ED via ems with finding of AFib /RVR.  She is currently asymptomatic w/o palpitations, cp, sob, though her rates are still hovering above 120 bpm.  Pt says VS were normal last night - suggesting that onset of afib was this am.  Home Medications  Prior to Admission medications   Medication Sig Start Date End Date Taking? Authorizing Provider  acetaminophen (TYLENOL) 325 MG tablet Take 650 mg by mouth every 4 (four) hours as needed. For temp above 102   Yes Historical Provider, MD  amLODipine (NORVASC) 10 MG tablet Take 10 mg by mouth daily.   Yes Historical Provider, MD  calcium carbonate (OS-CAL) 600 MG TABS Take 600 mg by mouth 2 (two) times daily with a meal.   Yes Historical Provider, MD  docusate sodium (COLACE) 100 MG capsule Take 100 mg by mouth daily.   Yes Historical Provider, MD  enoxaparin (LOVENOX) 30 MG/0.3ML SOLN Inject 30 mg into the skin every 12 (twelve) hours. 04/01/11  Yes Margart Sickles, PA-C  Fluticasone-Salmeterol (ADVAIR) 250-50 MCG/DOSE AEPB Inhale 1 puff into the lungs every 12 (twelve) hours.   Yes Historical Provider, MD  Glucosamine-Chondroit-Vit C-Mn (GLUCOSAMINE CHONDR 1500 COMPLX PO) Take 1 tablet by mouth daily.   Yes Historical Provider, MD  hydrochlorothiazide (HYDRODIURIL) 25 MG tablet Take 25 mg by mouth daily.   Yes Historical Provider, MD  lisinopril (PRINIVIL,ZESTRIL) 40 MG tablet Take 40 mg by mouth daily.   Yes Historical Provider, MD  metoprolol succinate (TOPROL-XL) 100 MG 24 hr tablet Take 100 mg by mouth daily. Take with or immediately following a meal.   Yes Historical Provider, MD  Multiple Vitamin (MULITIVITAMIN WITH MINERALS) TABS Take 1 tablet by mouth  daily.   Yes Historical Provider, MD  Multiple Vitamins-Minerals (ICAPS PO) Take 1 tablet by mouth 2 (two) times daily.   Yes Historical Provider, MD  omeprazole (PRILOSEC) 20 MG capsule Take 20 mg by mouth daily.   Yes Historical Provider, MD  oxyCODONE (OXY IR/ROXICODONE) 5 MG immediate release tablet Take 5 mg by mouth every 3 (three) hours as needed. For pain 04/01/11 04/11/11 Yes Margart Sickles, PA-C    Family History  Family History  Problem Relation Age of Onset  . Diabetes      Father, Mother, 4 sisters (2 living)  . Heart attack      Father, sister (Deceased)  . Heart failure      Father (deceased 59)  . Stroke      Mother (deceased 36)  . Cancer      Sister (deceased)    Social History  History   Social History  . Marital Status: Widowed    Spouse Name: N/A    Number of Children: N/A  . Years of Education: N/A   Occupational History  . Not on file.   Social History Main Topics  . Smoking status: Former Smoker -- 21 years    Types: Cigarettes    Quit date: 03/17/1970  . Smokeless tobacco: Never Used  . Alcohol Use: No  . Drug Use: No  . Sexually Active: Not Currently   Other Topics Concern  . Not on file   Social History Narrative   Currently staying @ [facility] for rehab following R THA 2/22 (d/c 04/01/11).Lives in [place].  Recently widowed 4 mos ago (husband w/ dementia & parkinsons)Has been receiving PT @ [facility].  Was not active prior to hip surgery.     Review of Systems General:  Had low grade fevers following surgery but none since d/c.  No chills, night sweats or weight changes.  Cardiovascular:  Presyncope as outlined above.  No chest pain, dyspnea on exertion, edema, orthopnea, palpitations, paroxysmal nocturnal dyspnea. Dermatological: No rash, lesions/masses Respiratory: No cough, dyspnea Urologic: No hematuria, dysuria Abdominal:   No nausea, vomiting, diarrhea, bright red blood per rectum, melena, or hematemesis Neurologic:  No  visual changes, wkns, changes in mental status. MSK:  No hip pain. All other systems reviewed and are otherwise negative except as noted above.  Physical Exam  Blood pressure 152/79, pulse 77, temperature 98.8 F (37.1 C), temperature source Oral, resp. rate 18, SpO2 100.00%.  General: Pleasant, NAD Psych: Normal affect. Neuro: Alert and oriented X 3. Moves all extremities spontaneously. HEENT: Normal  Neck: Supple without bruits or JVD. Lungs:  Resp regular and unlabored, CTA. Heart: IR, IR no s3, s4, or murmurs. Abdomen: Soft, non-tender, non-distended, BS + x 4.  Extremities: No clubbing, cyanosis or edema. DP/PT/Radials 1+  and equal bilaterally.  Right hip/thigh incision clean/dry.  Labs  No results found for this basename: CKTOTAL:4,CKMB:4,TROPONINI:4 in the last 72 hours Lab Results  Component Value Date   WBC 11.1* 04/04/2011   HGB 9.7* 04/04/2011   HCT 29.5* 04/04/2011   MCV 90.2 04/04/2011   PLT 476* 04/04/2011     Lab 04/04/11 1342  NA 138  K 3.5  CL 96  CO2 29  BUN 23  CREATININE 1.40*  CALCIUM 9.3  PROT 6.6  BILITOT 0.4  ALKPHOS 160*  ALT 70*  AST 89*  GLUCOSE 116*    Radiology/Studies  Dg Chest 2 View  04/04/2011  *RADIOLOGY REPORT*  Clinical Data: Dizziness, hypertension, asthma, fever.  CHEST - 2 VIEW  Comparison: 03/28/2011  Findings: Mild cardiomegaly.  Apparent increased basilar markings likely related to the AP lordotic nature of the study.  No confluent opacities.  No effusions.  No acute bony abnormality.  IMPRESSION: Cardiomegaly.  No acute findings.  Original Report Authenticated By: Cyndie Chime, M.D.   ECG  Afib, pvc, 116, no acute st/t changes.  ASSESSMENT AND PLAN  1.  Afib w/ RVR:  Pt presents following 2 episodes of presyncope and finding of new afib.  She is currently asymptomatic despite elevated rates, though she says that VS were normal last night - suggesting that onset was truly this am.  Plan to admit, add heparin, IV Dilt.  Cont  home dose of BB.   Check Mg, tsh, echo.  Supp K+.  CHADS2 = 2 (htn, age).  She is 'allergic' to ASA - saying that she experiences severe GI upset when taking.  She does not want to take coumadin (husband was on and had bad experience).  She would be willing to take one of the newer agents.  If she does not convert prior to am, consider dccv + rivaroxaban.   However, would not suggest th is on the short term because of recent surgery, and inability to intervene with bleeding.  Would opt for coumadin if she can be convinced.    2.  HTN:  Stable.  3.  UTI:  Given rocephin in ED.  Will add cipro.  Asymptomatic.  4.  OA s/p R THA:  Will ask PT to see while hospitalized.  Discussed with ortho who feels it is safe to initiate full anticoagulation.  Signed, Nicolasa Ducking, NP 04/04/2011, 3:57 PM  Patient seen and examined.  Presents with onset of near syncope and found to be in atrial fib with RVR.  She has had one prior episode similar to this.  Denies any chest pain.  She has been at [facility] undergoing rehab for recent hip surgery.  I have spoken with Dr. Carola Frost on call for Dr. Madelon Lips.   She is allergic to ASA and does not want to take warfarin, but I spoke with her at length about this.  Her exam is largely unremarkable.  Rec:  1.  TFTs 2.  IV dilt, then start PO tomorrow. 3.  2D echo to assess LV 4.  Recheck LFTs  5.  Notify Dr. Elmore Guise about patient's admission.   Shawnie Pons 6:03 PM 04/04/2011

## 2011-04-04 NOTE — Progress Notes (Signed)
ANTICOAGULATION CONSULT NOTE - Initial Consult  Pharmacy Consult for Heparin Indication: atrial fibrillation  Allergies  Allergen Reactions  . Aspirin Nausea And Vomiting  . Codeine Nausea And Vomiting  . Robaxin Hives  . Vicodin (Hydrocodone-Acetaminophen)     Interacts with bp medication and drops blood pressure    Patient Measurements:   Ht: 66 inches Wt: 98 kg Heparin Dosing Weight: 81 kg  Vital Signs: Temp: 98.8 F (37.1 C) (03/01 1526) Temp src: Oral (03/01 1526) BP: 112/45 mmHg (03/01 1655) Pulse Rate: 108  (03/01 1655)  Labs:  [facility] 04/04/11 1342  HGB 9.7*  HCT 29.5*  PLT 476*  APTT --  LABPROT --  INR --  HEPARINUNFRC --  CREATININE 1.40*  CKTOTAL --  CKMB --  TROPONINI --   Estimated Creatinine Clearance 45 ml/min  Medical History: Past Medical History  Diagnosis Date  . Asthma   . Shortness of breath   . Arthritis   . Anxiety   . Hypertension   . Atrial fibrillation     a. 04/04/11  . Osteoarthritis     a. 03/28/11 - R Total Hip Arthroplasty    Medications:  See Med Rec. Of note, pt on Lovenox 30mg  SQ q12h (s/p R THA 2/22) - last dose given this a.m. at 0900.  Assessment: 78 y.o. Female presents with new onset afib with RVR. S/p recent R THA 2/22 (d/c on 2/26 on lovenox 30mg  SQ q12h - last dose given this a.m. at 0900). Noted CHADs2 = 2. Pt does not want to take coumadin (husband on in past and with bad experience). Plan to start heparin gtt and if does not convert prior to a.m., consider dccv and rivaroxaban per MD note.  Goal of Therapy:  Heparin level 0.3-0.5 (per Dr. Riley Kill)   Plan:  1. Heparin bolus 4000 units IV now 2. Heparin gtt at 1000 units/hr 3. Check heparin level and CBC 8 hr post gtt start 4. Daily heparin level and CBC  Nayleah Gamel, Hilario Quarry, PharmD, BCPS Clinical pharmacist, pager [phone] 04/04/2011,5:09 PM

## 2011-04-04 NOTE — ED Notes (Signed)
[id number] Ready 

## 2011-04-04 NOTE — ED Notes (Signed)
Flow Manager contacted for bed status change from tele to SD. Per NP Brion Aliment

## 2011-04-04 NOTE — ED Notes (Signed)
Pt presents to department via [facility] from [facility] for evaluation of new onset atrial fibrillation. Ongoing x2 days, pt also states intermittent dizziness. Denies chest pain. No SOB. 18g LAC. She is conscious alert and oriented x4. Pt is DNR. No signs of acute distress at the present.

## 2011-04-04 NOTE — ED Provider Notes (Signed)
History     CSN: [id number]  Arrival date & time 04/04/11  1238   First MD Initiated Contact with Patient 04/04/11 1241      Chief Complaint  Patient presents with  . Atrial Fibrillation  . Dizziness    (Consider location/radiation/quality/duration/timing/severity/associated sxs/prior treatment) Patient is a 78 y.o. female presenting with weakness. The history is provided by the patient.  Weakness The primary symptoms include dizziness. Primary symptoms do not include headaches, syncope, focal weakness, fever or nausea. The symptoms began 2 to 6 hours ago.  Dizziness also occurs with weakness. Dizziness does not occur with nausea or diaphoresis.  Additional symptoms include weakness.  Pt states she woke up this morning and since then has had intermittent dizziness, especially with activity. States was uanble to go to physical therapy. States had a hip replaced 7 days ago. States no history of the same. Denies fever, chills, chest pain, cough, shortness of breath, abdominal pain.   Past Medical History  Diagnosis Date  . Asthma   . Shortness of breath   . Arthritis   . Anxiety   . Hypertension     dr Beverely Pace    Past Surgical History  Procedure Date  . Cardiac catheterization     over 7 yrs ago  . Abdominal hysterectomy   . Appendectomy   . Cholecystectomy   . Back surgery   . Cataract extraction bilateral w/ anterior vitrectomy   . Joint replacement     hip,knees,shoulder  . Tonsillectomy   . Total hip arthroplasty 03/28/2011    Procedure: TOTAL HIP ARTHROPLASTY;  Surgeon: Thera Flake., MD;  Location: [facility] OR;  Service: Orthopedics;  Laterality: Right;    No family history on file.  History  Substance Use Topics  . Smoking status: Former Smoker    Quit date: 03/17/1970  . Smokeless tobacco: Not on file  . Alcohol Use: No    OB History    Grav Para Term Preterm Abortions TAB SAB Ect Mult Living                  Review of Systems  Constitutional: Positive  for fatigue. Negative for fever, chills and diaphoresis.  HENT: Negative.   Eyes: Negative.   Respiratory: Negative for cough and shortness of breath.   Cardiovascular: Negative for chest pain, palpitations and syncope.  Gastrointestinal: Negative.  Negative for nausea.  Genitourinary: Negative.   Musculoskeletal: Negative.   Skin: Negative.   Neurological: Positive for dizziness, weakness and light-headedness. Negative for focal weakness and headaches.  Psychiatric/Behavioral: Negative.     Allergies  Aspirin; Codeine; Robaxin; and Vicodin  Home Medications   Current Outpatient Rx  Name Route Sig Dispense Refill  . AMLODIPINE BESYLATE 5 MG PO TABS Oral Take 5 mg by mouth daily.    Marland Kitchen CALCIUM CARBONATE 600 MG PO TABS Oral Take 600 mg by mouth 2 (two) times daily with a meal.    . ENOXAPARIN SODIUM 30 MG/0.3ML [place] SOLN Subcutaneous Inject 0.3 mLs (30 mg total) into the skin every 12 (twelve) hours. 20 Syringe 0  . FLUTICASONE-SALMETEROL 250-50 MCG/DOSE IN AEPB Inhalation Inhale 1 puff into the lungs every 12 (twelve) hours.    Marland Kitchen GLUCOSAMINE CHONDR 1500 COMPLX PO Oral Take 1 tablet by mouth daily.    Marland Kitchen HYDROCHLOROTHIAZIDE 25 MG PO TABS Oral Take 25 mg by mouth daily.    Marland Kitchen LISINOPRIL 40 MG PO TABS Oral Take 40 mg by mouth daily.    Marland Kitchen  METOPROLOL SUCCINATE ER 100 MG PO TB24 Oral Take 100 mg by mouth daily. Take with or immediately following a meal.    . ADULT MULTIVITAMIN W/MINERALS CH Oral Take 1 tablet by mouth daily.    . ICAPS PO Oral Take 1 tablet by mouth 2 (two) times daily.    Marland Kitchen OMEPRAZOLE 20 MG PO CPDR Oral Take 20 mg by mouth daily.    . OXYCODONE HCL 5 MG PO TABS Oral Take 1 tablet (5 mg total) by mouth every 3 (three) hours as needed. 90 tablet 0    BP 103/58  Pulse 126  Temp(Src) 98.9 F (37.2 C) (Oral)  Resp 20  SpO2 94%  Physical Exam  Nursing note and vitals reviewed. Constitutional: She is oriented to person, place, and time. She appears well-developed and  well-nourished. No distress.  HENT:  Head: Normocephalic and atraumatic.  Eyes: Conjunctivae are normal.  Neck: Neck supple.  Cardiovascular: S1 normal, S2 normal and normal pulses.  An irregularly irregular rhythm present. Tachycardia present.   Pulmonary/Chest: Effort normal and breath sounds normal. No respiratory distress. She has no wheezes. She has no rales.  Abdominal: Soft. Bowel sounds are normal. She exhibits no distension. There is no tenderness.  Musculoskeletal: Normal range of motion. She exhibits no edema.  Neurological: She is alert and oriented to person, place, and time.  Skin: Skin is warm and dry.  Psychiatric: She has a normal mood and affect.    ED Course  Procedures (including critical care time)  1:04 PM Pt seen and examined. Pt with what appears to be a new onset of afib. Recent right hip arthroplasty, no problems with it. No CP, no SOB, Oxygen sat normal, at this time doubt PE. Will get labs, fluid bolus ordered. Will monitor.    Date: 04/04/2011  Rate: 116  Rhythm: atrial fibrillation and premature ventricular contractions (PVC)  QRS Axis: normal  Intervals: normal  ST/T Wave abnormalities: normal  Conduction Disutrbances:none  Narrative Interpretation:   Old EKG Reviewed: none available  New onset of afib. Did not start any medications. Will try hydration. Pt's BP is borderline low. Will monitor. She is in no distress. Labs pending.   Results for orders placed during the hospital encounter of 04/04/11  CBC      Component Value Range   WBC 11.1 (*) 4.0 - 10.5 (K/uL)   RBC 3.27 (*) 3.87 - 5.11 (MIL/uL)   Hemoglobin 9.7 (*) 12.0 - 15.0 (g/dL)   HCT 18.8 (*) 41.6 - 46.0 (%)   MCV 90.2  78.0 - 100.0 (fL)   MCH 29.7  26.0 - 34.0 (pg)   MCHC 32.9  30.0 - 36.0 (g/dL)   RDW 60.6  30.1 - 60.1 (%)   Platelets 476 (*) 150 - 400 (K/uL)  DIFFERENTIAL      Component Value Range   Neutrophils Relative 70  43 - 77 (%)   Neutro Abs 7.8 (*) 1.7 - 7.7 (K/uL)    Lymphocytes Relative 16  12 - 46 (%)   Lymphs Abs 1.8  0.7 - 4.0 (K/uL)   Monocytes Relative 11  3 - 12 (%)   Monocytes Absolute 1.2 (*) 0.1 - 1.0 (K/uL)   Eosinophils Relative 2  0 - 5 (%)   Eosinophils Absolute 0.2  0.0 - 0.7 (K/uL)   Basophils Relative 0  0 - 1 (%)   Basophils Absolute 0.0  0.0 - 0.1 (K/uL)  COMPREHENSIVE METABOLIC PANEL      Component Value  Range   Sodium 138  135 - 145 (mEq/L)   Potassium 3.5  3.5 - 5.1 (mEq/L)   Chloride 96  96 - 112 (mEq/L)   CO2 29  19 - 32 (mEq/L)   Glucose, Bld 116 (*) 70 - 99 (mg/dL)   BUN 23  6 - 23 (mg/dL)   Creatinine, Ser 1.61 (*) 0.50 - 1.10 (mg/dL)   Calcium 9.3  8.4 - 09.6 (mg/dL)   Total Protein 6.6  6.0 - 8.3 (g/dL)   Albumin 2.8 (*) 3.5 - 5.2 (g/dL)   AST 89 (*) 0 - 37 (U/L)   ALT 70 (*) 0 - 35 (U/L)   Alkaline Phosphatase 160 (*) 39 - 117 (U/L)   Total Bilirubin 0.4  0.3 - 1.2 (mg/dL)   GFR calc non Af Amer 36 (*) >90 (mL/min)   GFR calc Af Amer 41 (*) >90 (mL/min)  URINALYSIS, ROUTINE W REFLEX MICROSCOPIC      Component Value Range   Color, Urine YELLOW  YELLOW    APPearance CLOUDY (*) CLEAR    Specific Gravity, Urine 1.013  1.005 - 1.030    pH 6.0  5.0 - 8.0    Glucose, UA NEGATIVE  NEGATIVE (mg/dL)   Hgb urine dipstick NEGATIVE  NEGATIVE    Bilirubin Urine NEGATIVE  NEGATIVE    Ketones, ur NEGATIVE  NEGATIVE (mg/dL)   Protein, ur NEGATIVE  NEGATIVE (mg/dL)   Urobilinogen, UA 0.2  0.0 - 1.0 (mg/dL)   Nitrite NEGATIVE  NEGATIVE    Leukocytes, UA MODERATE (*) NEGATIVE   POCT I-STAT TROPONIN I      Component Value Range   Troponin i, poc 0.01  0.00 - 0.08 (ng/mL)   Comment 3           URINE MICROSCOPIC-ADD ON      Component Value Range   Squamous Epithelial / LPF RARE  RARE    WBC, UA 21-50  <3 (WBC/hpf)   RBC / HPF 0-2  <3 (RBC/hpf)   Bacteria, UA MANY (*) RARE    Urine-Other MUCOUS PRESENT      Dg Chest 2 View  04/04/2011  *RADIOLOGY REPORT*  Clinical Data: Dizziness, hypertension, asthma, fever.  CHEST - 2  VIEW  Comparison: 03/28/2011  Findings: Mild cardiomegaly.  Apparent increased basilar markings likely related to the AP lordotic nature of the study.  No confluent opacities.  No effusions.  No acute bony abnormality.  IMPRESSION: Cardiomegaly.  No acute findings.  Original Report Authenticated By: Cyndie Chime, M.D.   Dr. Jeraldine Loots seen pt. Will call cardiology to come see pt in ED.   No diagnosis found.    MDM          Lottie Mussel, PA 04/04/11 1530

## 2011-04-04 NOTE — ED Notes (Signed)
2900 called, spoke with charge nurse, stated that room is being cleaned at the time, nurse will call back for report before shift change.

## 2011-04-04 NOTE — Consult Note (Signed)
  78 yo female POD 6 s/p R THA  Minimal pain; no falls post-op, no current hip complaints  D/c'd on Lovenox 30mg  SQ BID  PE: incision with scant clear drainage at apex only, intact sens and motor distally, no sig edema; no pain with axial loading or gentle motion  Orthopaedic Plan:  1.Cleared for whatever anticoagulation regimen deemed most appropriate by Dr. Riley Kill, Cardiology 2. WBAT RLE with posterior hip precautions w PT 3. Have answered patient and family's questions; will notify Dr. Madelon Lips to see on Monday; please call with any orthopaedic questions.  (All ortho orders entered) 4.  Will reassess wound post anticoag; patient aware of risk  Thank you.

## 2011-04-04 NOTE — ED Notes (Signed)
Pt resting quietly at the time. Admitting physician at the bedside. Denies pain. Pt remains alert and oriented x4. afib on cardiac monitor.

## 2011-04-04 NOTE — ED Notes (Signed)
Pt resting quietly at the time. Remains afib on monitor. Denies pain. Respirations unlabored. No signs of distress at the time. Pt updated on plan of care and admission.

## 2011-04-04 NOTE — ED Notes (Signed)
Pt presents to department for evaluation of new onset afib. States she has been dizzy and lightheaded for several days. States "I felt like I was going to pass out." now states she can feel her heart "racing." denies chest pain. Respirations unlabored. She is conscious alert and oriented x4. States recent R sided hip replacement, also states decreased appetite and weakness. Denies pain at the time. Skin warm and dry. No signs of acute distress noted.

## 2011-04-04 NOTE — ED Notes (Signed)
Pt resting quietly at the time. Remains afib on cardiac monitor. Bed placement changed to stepdown per admitting, pt and family updated on plan of care. No signs of distress at present.

## 2011-04-04 NOTE — ED Notes (Signed)
Pt resting quietly at the time. Remains on cardiac monitor. Denies pain. Family at the bedside. No signs of distress at the present.

## 2011-04-04 NOTE — ED Notes (Signed)
Dinner tray delivered.

## 2011-04-05 ENCOUNTER — Encounter (HOSPITAL_COMMUNITY): Payer: Self-pay | Admitting: Anesthesiology

## 2011-04-05 ENCOUNTER — Inpatient Hospital Stay (HOSPITAL_COMMUNITY): Payer: Medicare Other | Admitting: Anesthesiology

## 2011-04-05 ENCOUNTER — Other Ambulatory Visit: Payer: Self-pay

## 2011-04-05 ENCOUNTER — Encounter (HOSPITAL_COMMUNITY)
Admission: EM | Disposition: A | Discharge status: Skilled Nursing Facility | Payer: Self-pay | Source: Home / Self Care | Attending: Cardiology

## 2011-04-05 DIAGNOSIS — I4891 Unspecified atrial fibrillation: Secondary | ICD-10-CM

## 2011-04-05 HISTORY — PX: CARDIOVERSION: SHX1299

## 2011-04-05 LAB — CBC
Hemoglobin: 8.5 g/dL — ABNORMAL LOW (ref 12.0–15.0)
MCH: 28.9 pg (ref 26.0–34.0)
MCH: 29.1 pg (ref 26.0–34.0)
MCHC: 32.2 g/dL (ref 30.0–36.0)
MCHC: 32.4 g/dL (ref 30.0–36.0)
MCV: 90.1 fL (ref 78.0–100.0)
Platelets: 446 10*3/uL — ABNORMAL HIGH (ref 150–400)
Platelets: 447 10*3/uL — ABNORMAL HIGH (ref 150–400)
RBC: 3.02 MIL/uL — ABNORMAL LOW (ref 3.87–5.11)

## 2011-04-05 LAB — CARDIAC PANEL(CRET KIN+CKTOT+MB+TROPI)
CK, MB: 2 ng/mL (ref 0.3–4.0)
Relative Index: INVALID (ref 0.0–2.5)
Troponin I: <0.3 ng/mL (ref <0.30)

## 2011-04-05 LAB — LIPID PANEL
Cholesterol: 204 mg/dL — ABNORMAL HIGH (ref 0–200)
HDL: 26 mg/dL — ABNORMAL LOW (ref >39)
Total CHOL/HDL Ratio: 7.8 RATIO
VLDL: 42 mg/dL — ABNORMAL HIGH (ref 0–40)

## 2011-04-05 LAB — BASIC METABOLIC PANEL
CO2: 25 mEq/L (ref 19–32)
Calcium: 8.4 mg/dL (ref 8.4–10.5)
Creatinine, Ser: 1.31 mg/dL — ABNORMAL HIGH (ref 0.50–1.10)
GFR calc non Af Amer: 39 mL/min — ABNORMAL LOW (ref >90)

## 2011-04-05 LAB — HEPARIN LEVEL (UNFRACTIONATED): Heparin Unfractionated: 0.14 IU/mL — ABNORMAL LOW (ref 0.30–0.70)

## 2011-04-05 LAB — TSH: TSH: 2.437 u[IU]/mL (ref 0.350–4.500)

## 2011-04-05 SURGERY — CARDIOVERSION
Anesthesia: Monitor Anesthesia Care | Wound class: Clean

## 2011-04-05 MED ORDER — SODIUM CHLORIDE 0.9 % IJ SOLN: 3.0000 mL | INTRAMUSCULAR | Status: DC | PRN

## 2011-04-05 MED ORDER — DABIGATRAN ETEXILATE MESYLATE 150 MG PO CAPS
150.0000 mg | ORAL_CAPSULE | Freq: Two times a day (BID) | ORAL | Status: DC
  Administered 2011-04-05 – 2011-04-06 (×3): 150 mg via ORAL
  Filled 2011-04-05 (×4): qty 1

## 2011-04-05 MED ORDER — PROPOFOL 10 MG/ML IV EMUL
INTRAVENOUS | Status: DC | PRN
  Administered 2011-04-05: 80 mg via INTRAVENOUS

## 2011-04-05 MED ORDER — FLECAINIDE ACETATE 50 MG PO TABS
50.0000 mg | ORAL_TABLET | Freq: Two times a day (BID) | ORAL | Status: DC
  Administered 2011-04-05 – 2011-04-06 (×3): 50 mg via ORAL
  Filled 2011-04-05 (×4): qty 1

## 2011-04-05 MED ORDER — HYDROCORTISONE 1 % EX CREA
1.0000 "application " | TOPICAL_CREAM | Freq: Three times a day (TID) | CUTANEOUS | Status: DC | PRN
  Filled 2011-04-05: qty 28

## 2011-04-05 MED ORDER — HEPARIN BOLUS VIA INFUSION
2000.0000 [IU] | Freq: Once | INTRAVENOUS | Status: AC
  Administered 2011-04-05: 2000 [IU] via INTRAVENOUS
  Filled 2011-04-05: qty 2000

## 2011-04-05 MED ORDER — SODIUM CHLORIDE 0.9 % IJ SOLN
3.0000 mL | Freq: Two times a day (BID) | INTRAMUSCULAR | Status: DC
  Administered 2011-04-05: 3 mL via INTRAVENOUS

## 2011-04-05 MED ORDER — SODIUM CHLORIDE 0.9 % IV SOLN
250.0000 mL | INTRAVENOUS | Status: DC
  Administered 2011-04-05: 250 mL via INTRAVENOUS

## 2011-04-05 NOTE — Anesthesia Preprocedure Evaluation (Addendum)
Anesthesia Evaluation  Patient identified by MRN, date of birth, ID band Patient awake    Reviewed: Allergy & Precautions, H&P , NPO status , Patient's Chart, lab work & pertinent test results, reviewed documented beta blocker date and time   Airway       Dental   Pulmonary shortness of breath, asthma , former smoker         Cardiovascular hypertension, Pt. on home beta blockers and Pt. on medications + dysrhythmias Atrial Fibrillation     Neuro/Psych Anxiety    GI/Hepatic   Endo/Other    Renal/GU      Musculoskeletal  (+) Arthritis -,   Abdominal   Peds  Hematology   Anesthesia Other Findings   Reproductive/Obstetrics                           Anesthesia Physical Anesthesia Plan  ASA: III  Anesthesia Plan: MAC   Post-op Pain Management:    Induction: Intravenous  Airway Management Planned: Mask  Additional Equipment:   Intra-op Plan:   Post-operative Plan:   Informed Consent: I have reviewed the patients History and Physical, chart, labs and discussed the procedure including the risks, benefits and alternatives for the proposed anesthesia with the patient or authorized representative who has indicated his/her understanding and acceptance.   Dental advisory given  Plan Discussed with: CRNA, Anesthesiologist and Surgeon  Anesthesia Plan Comments:         Anesthesia Quick Evaluation

## 2011-04-05 NOTE — ED Provider Notes (Signed)
Medical screening examination/treatment/procedure(s) were conducted as a shared visit with non-physician practitioner(s) and myself.  I personally evaluated the patient during the encounter This pleasant elderly female who is in no distress on my evaluation presents with new onset of atrial fibrillation.  Her denial of significant discomfort, notable gross abnormalities beyond the atrial fibrillation his reassuring.  As this is a new phenomena for this patient she was admitted for further evaluation and management. I have seen ECG and agree with the interpretation.  Gerhard Munch, MD 04/05/11 [phone]

## 2011-04-05 NOTE — Transfer of Care (Signed)
Immediate Anesthesia Transfer of Care Note  Patient: Jordan Byrd  Procedure(s) Performed: Procedure(s) (LRB): CARDIOVERSION (N/A)  Patient Location: PACU and Nursing Unit  Anesthesia Type: MAC  Level of Consciousness: awake, alert  and oriented  Airway & Oxygen Therapy: Patient Spontanous Breathing and Patient connected to nasal cannula oxygen  Post-op Assessment: Report given to PACU RN  Post vital signs: Reviewed and stable  Complications: No apparent anesthesia complications

## 2011-04-05 NOTE — Progress Notes (Signed)
See my previous note from today  Doing well now s/p cardioversion Will stop heparin gtt and start pradaxa 150mg  BID (CrCl >30) Continue metoprolol Add flecainide 50mg  BID  Consider stopping flecainide if no further afib upon follow-up in 6 weeks  Hope to discharge back to SNF tomorrow if rhythm remains stable and bed is available   Jarold Song

## 2011-04-05 NOTE — Progress Notes (Signed)
SUBJECTIVE: The patient is doing well today.  At this time, she denies chest pain, shortness of breath, or any new concerns.     . calcium carbonate  1,250 mg Oral BID WC  . cefTRIAXone (ROCEPHIN)  IV  1 g Intravenous Once  . docusate sodium  100 mg Oral Daily  . Fluticasone-Salmeterol  1 puff Inhalation Q12H  . heparin  2,000 Units Intravenous Once  . heparin  4,000 Units Intravenous Once  . lisinopril  40 mg Oral Daily  . metoprolol succinate  100 mg Oral Daily  . mulitivitamin with minerals  1 tablet Oral Daily  . pantoprazole  40 mg Oral Q1200  . potassium chloride  40 mEq Oral Once  . sodium chloride  500 mL Intravenous Once  . sodium chloride  3 mL Intravenous Q12H      . diltiazem (CARDIZEM) infusion 15 mg/hr (04/05/11 0534)  . heparin 1,200 Units/hr (04/05/11 1109)    OBJECTIVE: Physical Exam: Filed Vitals:   04/05/11 0740 04/05/11 0800 04/05/11 0900 04/05/11 0919  BP:   94/59   Pulse:    98  Temp: 99 F (37.2 C)     TempSrc: Oral     Resp:  23 25 23   Height:      Weight:      SpO2:    97%    Intake/Output Summary (Last 24 hours) at 04/05/11 1137 Last data filed at 04/05/11 1100  Gross per 24 hour  Intake 663.17 ml  Output    900 ml  Net -236.83 ml    Telemetry reveals afib  GEN- The patient is well appearing, alert and oriented x 3 today.   Head- normocephalic, atraumatic Eyes-  Sclera clear, conjunctiva pink Ears- hearing intact Oropharynx- clear Neck- supple, no JVP Lymph- no cervical lymphadenopathy Lungs- Clear to ausculation bilaterally, normal work of breathing Heart- irregular rate and rhythm, no murmurs, rubs or gallops, PMI not laterally displaced GI- soft, NT, ND, + BS Extremities- no clubbing, cyanosis, or edema  LABS: Basic Metabolic Panel:  Basename 04/05/11 0545 04/04/11 2121 04/04/11 1342  NA 140 -- 138  K 4.0 -- 3.5  CL 103 -- 96  CO2 25 -- 29  GLUCOSE 113* -- 116*  BUN 24* -- 23  CREATININE 1.31* -- 1.40*  CALCIUM 8.4  -- 9.3  MG -- 1.7 --  PHOS -- -- --   Liver Function Tests:  [facility] 04/04/11 1342  AST 89*  ALT 70*  ALKPHOS 160*  BILITOT 0.4  PROT 6.6  ALBUMIN 2.8*   No results found for this basename: LIPASE:2,AMYLASE:2 in the last 72 hours CBC:  Basename 04/05/11 0545 04/05/11 0143 04/04/11 1342  WBC 10.0 10.4 --  NEUTROABS -- -- 7.8*  HGB 8.8* 8.5* --  HCT 27.2* 26.4* --  MCV 90.1 89.8 --  PLT 447* 446* --   Cardiac Enzymes:  Basename 04/05/11 0853 04/05/11 0143 04/04/11 2121  CKTOTAL 59 55 65  CKMB 2.0 1.9 2.0  CKMBINDEX -- -- --  TROPONINI <0.30 <0.30 <0.30   BNP: No components found with this basename: POCBNP:3 D-Dimer: No results found for this basename: DDIMER:2 in the last 72 hours Hemoglobin A1C: No results found for this basename: HGBA1C in the last 72 hours Fasting Lipid Panel:  Basename 04/05/11 0545  CHOL 204*  HDL 26*  LDLCALC 136*  TRIG 211*  CHOLHDL 7.8  LDLDIRECT --   Thyroid Function Tests:  Basename 04/04/11 2121  TSH 2.437  T4TOTAL --  T3FREE --  THYROIDAB --   Anemia Panel: No results found for this basename: VITAMINB12,FOLATE,FERRITIN,TIBC,IRON,RETICCTPCT in the last 72 hours  RADIOLOGY: Dg Chest 1 View  03/28/2011  *RADIOLOGY REPORT*  Clinical Data: Preop  CHEST - 1 VIEW  Comparison: 04/15/2010  Findings: Upper normal heart size.  Clear lungs.  No pneumothorax.  IMPRESSION: No active cardiopulmonary disease.  Original Report Authenticated By: Donavan Burnet, M.D.   Dg Chest 2 View  04/04/2011  *RADIOLOGY REPORT*  Clinical Data: Dizziness, hypertension, asthma, fever.  CHEST - 2 VIEW  Comparison: 03/28/2011  Findings: Mild cardiomegaly.  Apparent increased basilar markings likely related to the AP lordotic nature of the study.  No confluent opacities.  No effusions.  No acute bony abnormality.  IMPRESSION: Cardiomegaly.  No acute findings.  Original Report Authenticated By: Cyndie Chime, M.D.   Dg Hip Portable 1 View Right  03/28/2011   *RADIOLOGY REPORT*  Clinical Data: Postop right total hip replacement  PORTABLE RIGHT HIP - 1 VIEW  Comparison: Pelvis film of 09/26/2010  Findings: The femoral and acetabular components of the right hip replacement are in good position.  No acute fracture is seen.  Some air is noted in the soft tissues of the right hip postoperatively.  IMPRESSION: Right total hip replacement in good position.  No acute abnormality.  Original Report Authenticated By: Juline Patch, M.D.   Mm Digital Screening  03/17/2011  DG SCREEN MAMMOGRAM BILATERAL Bilateral CC and MLO view(s) were taken.  DIGITAL SCREENING MAMMOGRAM WITH CAD: The breast tissue is heterogeneously dense.  No masses or malignant type calcifications are  identified.  Compared with prior studies.  Images were processed with CAD.  IMPRESSION: No specific mammographic evidence of malignancy.  Next screening mammogram is recommended in one  year.  A result letter of this screening mammogram will be mailed directly to the patient.  ASSESSMENT: Negative - BI-RADS 1  Screening mammogram in 1 year. ,    ASSESSMENT AND PLAN:  Principal Problem:  *Atrial fibrillation Active Problems:  Anxiety  Hypertension  Osteoarthritis  UTI (lower urinary tract infection)  Afib- admitted with postoperative afib < 48hours in duration I would recommend cardioversion today Would favor pradaxa for anticoagulation post cardioversion  Risks, benefits, and alternatives to cardioversion were discussed at length with the patient who wishes to proceed.   Hillis Range, MD 04/05/2011 11:37 AM

## 2011-04-05 NOTE — Preoperative (Signed)
Beta Blockers   Reason not to administer Beta Blockers:Not Applicable 

## 2011-04-05 NOTE — Progress Notes (Signed)
Patient feels well; no hip complaints s/p anticoagulation  PE: no increase in swelling noted; no hematoma, no drainage  Plan:  1. Orders written to mobilize with PT once cleared to do so by Cards (WBAT and post hip precautions) 2. Anticipate Dr. Madelon Lips to see on Monday. Call if questions

## 2011-04-05 NOTE — Anesthesia Postprocedure Evaluation (Signed)
  Anesthesia Post-op Note  Patient: Jordan Byrd  Procedure(s) Performed: Procedure(s) (LRB): CARDIOVERSION (N/A)  Patient Location: 2900  Anesthesia Type: General  Level of Consciousness: awake  Airway and Oxygen Therapy: Patient Spontanous Breathing and Patient connected to nasal cannula oxygen  Post-op Pain: none  Post-op Assessment: Post-op Vital signs reviewed, Patient's Cardiovascular Status Stable, Respiratory Function Stable and Patent Airway  Post-op Vital Signs: Reviewed and stable  Complications: No apparent anesthesia complications

## 2011-04-05 NOTE — Progress Notes (Signed)
CSW contacted by pt's RN re: return to SNF. Pt admitted from [facility] and is able to return, per [facility] in admissions. Jill Side reports pt ability to return tomorrow, Sunday, depends on admission coordinator availability. Jill Side to check and inform CSW by tomorrow morning. If pt not able to return Sunday, will plan for Monday d/c. FL2 on pt's shadow chart for MD signature.  Dellie Burns, MSW, [place] [phone] (weekend)

## 2011-04-05 NOTE — Progress Notes (Signed)
ANTICOAGULATION CONSULT NOTE - Follow Up Consult  Pharmacy Consult for heparin Indication: atrial fibrillation  Labs:  [facility] 04/05/11 0143 04/04/11 2121 04/04/11 1342  HGB 8.5* -- 9.7*  HCT 26.4* -- 29.5*  PLT 446* -- 476*  APTT -- -- --  LABPROT -- 14.1 --  INR -- 1.07 --  HEPARINUNFRC 0.14* -- --  CREATININE -- -- 1.40*  CKTOTAL -- 65 --  CKMB -- 2.0 --  TROPONINI -- <0.30 --   Assessment: 77yo female subtherapeutic on heparin with initial dosing for Afib, low goal per MD.  Goal of Therapy:  Heparin level 0.3-0.5   Plan:  Will give small heparin bolus of 2000 units and increase gtt by ~2 units/kg/hr to 1200 units/hr and check level in 8hr.  Colleen Can PharmD BCPS 04/05/2011,2:45 AM

## 2011-04-05 NOTE — Op Note (Signed)
Informed written consent was obtained. Pt was adequately sedated with IV propofol as outlined in anesthesia report. Afib successfully cardioverted to sinus rhythm with a single synchronized 200J shock delivered with cardioversion electrodes placed in the anterior/ posterior configuration.  She remained in sinus rhythm thereafter. No early apparent complications.  Fayrene Fearing Taralynn Quiett,MD

## 2011-04-06 LAB — CBC
Hemoglobin: 9 g/dL — ABNORMAL LOW (ref 12.0–15.0)
MCHC: 31.9 g/dL (ref 30.0–36.0)
Platelets: 520 10*3/uL — ABNORMAL HIGH (ref 150–400)
RBC: 3.06 MIL/uL — ABNORMAL LOW (ref 3.87–5.11)

## 2011-04-06 MED ORDER — FLECAINIDE ACETATE 50 MG PO TABS: 50.0000 mg | ORAL_TABLET | Freq: Two times a day (BID) | ORAL | Status: DC

## 2011-04-06 MED ORDER — DABIGATRAN ETEXILATE MESYLATE 150 MG PO CAPS: 150.0000 mg | ORAL_CAPSULE | Freq: Two times a day (BID) | ORAL | Status: DC

## 2011-04-06 NOTE — Progress Notes (Signed)
PT not needed for insurance auth prior to SNF return. SNF may have trouble obtaining new meds on Sunday. Will send d/c med list to SNF, once completed, for them to determine new med availability for today. If new meds not available today, will plan for SNF on Monday.  Dellie Burns, MSW, [place] [phone] (weekend)

## 2011-04-06 NOTE — Progress Notes (Signed)
Physical Therapy Patient Details Name: Jordan Byrd MRN: [id number] DOB: 24-Feb-1933 Today's Date: 04/06/2011  PT order from 3/2 notes mobilize once cleared by Cards.  No note clearing PT to mobilize,however noted plan for pt to return to [facility] Sunday or Monday.  If plan is to return to SNF, will defer PT eval to SNF, unless SW requires PT eval for insurance auth.  Please clarify.  Thanks.    Sunny Schlein, [place] [phone] 04/06/2011, 7:40 AM

## 2011-04-06 NOTE — Discharge Summary (Signed)
Physician Discharge Summary  Patient ID: MACARIA BIAS MRN: [id number] DOB/AGE: [birth year] 78 y.o.  Admit date: 04/04/2011 Discharge date: 04/06/2011  Primary Cardiologist: Hillis Range, MD   Primary Discharge Diagnosis: 1 Atrial fibrillation  - s/p successful TEC CP  - Pradaxa anticoagulation initiated 2 status post right THR, 03/28/11 3 ASA allergy  Secondary Discharge Diagnoses: Past Medical History  Diagnosis Date  . Asthma   . Shortness of breath   . Arthritis   . Anxiety   . Hypertension   . Atrial fibrillation     a. 04/04/11  . Osteoarthritis     a. 03/28/11 - R Total Hip Arthroplasty    Reason for Admission: 78 y/o female, who recently underwent R Total Hip Arthroplasty 2/22, d/c'd 2/26, presented with new onset afib rvr.   Procedures: DCCV  Hospital Course: Patient was initially placed on IV diltiazem for rate control. She was seen in consultation by orthopedics, who cleared her for any anticoagulation deemed appropriate. She was cleared to proceed with recommended DCCV on hospital day #1, given that she presented with postop A. fib less than 48 hour duration. Recommendation was to initiate Pradaxa, post cardioversion. She was successfully cardioverted following single 200 J. application. Dr. Johney Frame suggested stopping flecainide as an outpatient, if no further AF.  She was cleared to be discharged to SNF on hospital day #2, in hemodynamically stable condition, and maintaining NSR.  Discharge Vitals: Blood pressure 103/58, pulse 82, temperature 98.3 F (36.8 C), temperature source Oral, resp. rate 19, height 5\' 6"  (1.676 m), weight 220 lb 14.4 oz (100.2 kg), SpO2 99.00%.  Labs: Lab Results  Component Value Date   WBC 10.6* 04/06/2011   HGB 9.0* 04/06/2011   HCT 28.2* 04/06/2011   MCV 92.2 04/06/2011   PLT 520* 04/06/2011      Lab 04/05/11 0545 04/04/11 1342  NA 140 --  K 4.0 --  CL 103 --  CO2 25 --  BUN 24* --  CREATININE 1.31* --  CALCIUM 8.4 --  ALBUMIN --  2.8*  PROT -- 6.6  BILITOT -- 0.4  ALKPHOS -- 160*  ALT -- 70*  AST -- 89*  GLUCOSE 113* --    Lab Results  Component Value Date   CHOL 204* 04/05/2011   HDL 26* 04/05/2011   LDLCALC 136* 04/05/2011   TRIG 211* 04/05/2011    No results found for this basename: DDIMER    Lab Results  Component Value Date   TSH 2.437 04/04/2011     Basename 04/05/11 0853 04/05/11 0143 04/04/11 2121  CKTOTAL 59 55 65  CKMB 2.0 1.9 2.0  TROPONINI <0.30 <0.30 <0.30    Diagnostic Studies: Dg Chest 1 View  03/28/2011  *RADIOLOGY REPORT*  Clinical Data: Preop  CHEST - 1 VIEW  Comparison: 04/15/2010  Findings: Upper normal heart size.  Clear lungs.  No pneumothorax.  IMPRESSION: No active cardiopulmonary disease.  Original Report Authenticated By: Donavan Burnet, M.D.   Dg Chest 2 View  04/04/2011  *RADIOLOGY REPORT*  Clinical Data: Dizziness, hypertension, asthma, fever.  CHEST - 2 VIEW  Comparison: 03/28/2011  Findings: Mild cardiomegaly.  Apparent increased basilar markings likely related to the AP lordotic nature of the study.  No confluent opacities.  No effusions.  No acute bony abnormality.  IMPRESSION: Cardiomegaly.  No acute findings.  Original Report Authenticated By: Cyndie Chime, M.D.   Dg Hip Portable 1 View Right  03/28/2011  *RADIOLOGY REPORT*  Clinical Data: Postop right total hip  replacement  PORTABLE RIGHT HIP - 1 VIEW  Comparison: Pelvis film of 09/26/2010  Findings: The femoral and acetabular components of the right hip replacement are in good position.  No acute fracture is seen.  Some air is noted in the soft tissues of the right hip postoperatively.  IMPRESSION: Right total hip replacement in good position.  No acute abnormality.  Original Report Authenticated By: Juline Patch, M.D.   Mm Digital Screening  03/17/2011  DG SCREEN MAMMOGRAM BILATERAL Bilateral CC and MLO view(s) were taken.  DIGITAL SCREENING MAMMOGRAM WITH CAD: The breast tissue is heterogeneously dense.  No masses or  malignant type calcifications are  identified.  Compared with prior studies.  Images were processed with CAD.  IMPRESSION: No specific mammographic evidence of malignancy.  Next screening mammogram is recommended in one  year.  A result letter of this screening mammogram will be mailed directly to the patient.  ASSESSMENT: Negative - BI-RADS 1  Screening mammogram in 1 year. ,     DISPOSITION: Stable condition  FOLLOW UP PLANS AND APPOINTMENTS: Discharge Orders    Future Orders Please Complete By Expires   Diet - low sodium heart healthy      Increase activity slowly        Follow-up Information    Follow up with GREEN, Lorenda Ishihara, MD .         DISCHARGE MEDICATIONS: Medication List  As of 04/06/2011 10:09 AM   STOP taking these medications         enoxaparin 30 MG/0.3ML Soln         TAKE these medications         acetaminophen 325 MG tablet   Commonly known as: TYLENOL   Take 650 mg by mouth every 4 (four) hours as needed. For temp above 102      amLODipine 10 MG tablet   Commonly known as: NORVASC   Take 10 mg by mouth daily.      calcium carbonate 600 MG Tabs   Commonly known as: OS-CAL   Take 600 mg by mouth 2 (two) times daily with a meal.      dabigatran 150 MG Caps   Commonly known as: PRADAXA   Take 1 capsule (150 mg total) by mouth every 12 (twelve) hours.      docusate sodium 100 MG capsule   Commonly known as: COLACE   Take 100 mg by mouth daily.      flecainide 50 MG tablet   Commonly known as: TAMBOCOR   Take 1 tablet (50 mg total) by mouth every 12 (twelve) hours.      Fluticasone-Salmeterol 250-50 MCG/DOSE Aepb   Commonly known as: ADVAIR   Inhale 1 puff into the lungs every 12 (twelve) hours.      GLUCOSAMINE CHONDR 1500 COMPLX PO   Take 1 tablet by mouth daily.      hydrochlorothiazide 25 MG tablet   Commonly known as: HYDRODIURIL   Take 25 mg by mouth daily.      ICAPS PO   Take 1 tablet by mouth 2 (two) times daily.      lisinopril 40  MG tablet   Commonly known as: PRINIVIL,ZESTRIL   Take 40 mg by mouth daily.      metoprolol succinate 100 MG 24 hr tablet   Commonly known as: TOPROL-XL   Take 100 mg by mouth daily. Take with or immediately following a meal.      mulitivitamin with minerals Tabs  Take 1 tablet by mouth daily.      omeprazole 20 MG capsule   Commonly known as: PRILOSEC   Take 20 mg by mouth daily.      oxyCODONE 5 MG immediate release tablet   Commonly known as: Oxy IR/ROXICODONE   Take 5 mg by mouth every 3 (three) hours as needed. For pain            BRING ALL MEDICATIONS WITH YOU TO FOLLOW UP APPOINTMENTS  Time spent with patient to include physician time: Greater than 30 minutes, including physician time.  Signed: Gene Serpe 04/06/2011, 10:09 AM Co-Sign MD  I have seen, examined the patient, and reviewed the above assessment and plan.   Co Sign: Hillis Range, MD

## 2011-04-06 NOTE — Progress Notes (Signed)
Pt to return to [facility] today via family transport. No other CSW needs noted. CSW signing off.  Dellie Burns, MSW, [place] [phone] (weekend)

## 2011-04-06 NOTE — Progress Notes (Signed)
SUBJECTIVE: The patient is doing well today.  At this time, she denies chest pain, shortness of breath, or any new concerns.     . calcium carbonate  1,250 mg Oral BID WC  . dabigatran  150 mg Oral Q12H  . docusate sodium  100 mg Oral Daily  . flecainide  50 mg Oral Q12H  . Fluticasone-Salmeterol  1 puff Inhalation Q12H  . lisinopril  40 mg Oral Daily  . metoprolol succinate  100 mg Oral Daily  . mulitivitamin with minerals  1 tablet Oral Daily  . pantoprazole  40 mg Oral Q1200  . sodium chloride  3 mL Intravenous Q12H  . sodium chloride  3 mL Intravenous Q12H      . sodium chloride 250 mL (04/05/11 1343)  . DISCONTD: diltiazem (CARDIZEM) infusion 15 mg/hr (04/05/11 0534)  . DISCONTD: heparin 1,200 Units/hr (04/05/11 1109)    OBJECTIVE: Physical Exam: Filed Vitals:   04/06/11 0421 04/06/11 0500 04/06/11 0746 04/06/11 0803  BP: 103/58     Pulse:    82  Temp: 98.2 F (36.8 C)  98.3 F (36.8 C)   TempSrc: Oral  Oral   Resp: 21 20  19   Height:      Weight:  220 lb 14.4 oz (100.2 kg)    SpO2: 98%  96% 99%    Intake/Output Summary (Last 24 hours) at 04/06/11 0915 Last data filed at 04/06/11 0400  Gross per 24 hour  Intake    806 ml  Output   2400 ml  Net  -1594 ml    Telemetry reveals sinus rhythm, no arrhythmias  GEN- The patient is well appearing, alert and oriented x 3 today.   Head- normocephalic, atraumatic Eyes-  Sclera clear, conjunctiva pink Ears- hearing intact Oropharynx- clear Neck- supple, no JVP Lymph- no cervical lymphadenopathy Lungs- Clear to ausculation bilaterally, normal work of breathing Heart- Regular rate and rhythm, no murmurs, rubs or gallops, PMI not laterally displaced GI- soft, NT, ND, + BS Extremities- no clubbing, cyanosis, or edema   LABS: Basic Metabolic Panel:  Basename 04/05/11 0545 04/04/11 2121 04/04/11 1342  NA 140 -- 138  K 4.0 -- 3.5  CL 103 -- 96  CO2 25 -- 29  GLUCOSE 113* -- 116*  BUN 24* -- 23  CREATININE  1.31* -- 1.40*  CALCIUM 8.4 -- 9.3  MG -- 1.7 --  PHOS -- -- --   Liver Function Tests:  [facility] 04/04/11 1342  AST 89*  ALT 70*  ALKPHOS 160*  BILITOT 0.4  PROT 6.6  ALBUMIN 2.8*   No results found for this basename: LIPASE:2,AMYLASE:2 in the last 72 hours CBC:  Basename 04/06/11 0545 04/05/11 0545 04/04/11 1342  WBC 10.6* 10.0 --  NEUTROABS -- -- 7.8*  HGB 9.0* 8.8* --  HCT 28.2* 27.2* --  MCV 92.2 90.1 --  PLT 520* 447* --   Cardiac Enzymes:  Basename 04/05/11 0853 04/05/11 0143 04/04/11 2121  CKTOTAL 59 55 65  CKMB 2.0 1.9 2.0  CKMBINDEX -- -- --  TROPONINI <0.30 <0.30 <0.30   BNP: No components found with this basename: POCBNP:3 D-Dimer: No results found for this basename: DDIMER:2 in the last 72 hours Hemoglobin A1C: No results found for this basename: HGBA1C in the last 72 hours Fasting Lipid Panel:  Basename 04/05/11 0545  CHOL 204*  HDL 26*  LDLCALC 136*  TRIG 211*  CHOLHDL 7.8  LDLDIRECT --   Thyroid Function Tests:  Basename 04/04/11 2121  TSH 2.437  T4TOTAL --  T3FREE --  THYROIDAB --   ASSESSMENT AND PLAN:  Principal Problem:  *Atrial fibrillation Active Problems:  Anxiety  Hypertension  Osteoarthritis  UTI (lower urinary tract infection)  1.  Post- op afib-  Now maintaining sinus rhythm s/p cardioversion Continue pradaxa 150mg  BID Continue flecainide 50mg  BID and toprol XL 100mg  daily  Resume home medications  DC to SNF today Follow-up with me in 4 weeks.  Hillis Range, MD 04/06/2011 9:15 AM

## 2011-04-07 LAB — URINE CULTURE
Colony Count: >=100000
Culture  Setup Time: 201303011653

## 2011-04-09 ENCOUNTER — Encounter (HOSPITAL_COMMUNITY): Payer: Self-pay | Admitting: Internal Medicine

## 2011-04-23 ENCOUNTER — Telehealth: Payer: Self-pay | Admitting: Internal Medicine

## 2011-04-23 ENCOUNTER — Encounter: Payer: Medicare Other | Admitting: Physician Assistant

## 2011-04-23 ENCOUNTER — Encounter: Payer: Self-pay | Admitting: Physician Assistant

## 2011-04-23 ENCOUNTER — Ambulatory Visit (INDEPENDENT_AMBULATORY_CARE_PROVIDER_SITE_OTHER): Payer: Medicare Other | Admitting: Physician Assistant

## 2011-04-23 VITALS — BP 113/62 | HR 61 | Ht 66.0 in | Wt 219.0 lb

## 2011-04-23 DIAGNOSIS — I4891 Unspecified atrial fibrillation: Secondary | ICD-10-CM

## 2011-04-23 MED ORDER — DABIGATRAN ETEXILATE MESYLATE 150 MG PO CAPS: 150.0000 mg | ORAL_CAPSULE | Freq: Two times a day (BID) | ORAL | Status: DC

## 2011-04-23 MED ORDER — FLECAINIDE ACETATE 50 MG PO TABS
50.0000 mg | ORAL_TABLET | Freq: Two times a day (BID) | ORAL | Status: DC
Start: 2011-04-23 — End: 2011-07-02

## 2011-04-23 NOTE — Progress Notes (Signed)
[address] [place], [place]  [address] Phone: [phone] Fax:  [phone]  Date:  04/23/2011   Name:  Jordan Byrd       DOB:  1934/01/08 MRN:  [id number]  PCP:  Dr. Nila Nephew Primary Cardiologist:  Dr. Hillis Range  Primary Electrophysiologist:  Dr. Hillis Range    History of Present Illness: Jordan Byrd is a 78 y.o. female who presents for post hospital follow up.   She has a history of hypertension, asthma and arthritis.  She underwent right THR 03/28/11.  She was readmitted 3/1-3/3 with new onset atrial fibrillation with rapid ventricular rate.  He is placed on diltiazem for rate control.  Her atrial fibrillation was felt to be less than 48 hours duration.  She was placed on Pradaxa and underwent cardioversion with successful return to normal sinus rhythm.  She was also placed on flecainide.  The notes from the hospital indicate that this may be discontinued after 6 weeks if she remained in sinus rhythm.      Hospital labs: Hemoglobin 9, MCV 92.2, potassium 4, creatinine 1.31, TSH 2.437.  Chest x-ray 04/04/11: Cardiomegaly, no acute findings.  She was discharged from [facility] yesterday.  She is doing well.  She denies palpitations, chest pain, shortness of breath, syncope, near-syncope, orthopnea, PND, edema.  She denies any bleeding problems.  Past Medical History  Diagnosis Date  . Asthma   . Shortness of breath   . Arthritis   . Anxiety   . Hypertension   . Atrial fibrillation     a. 04/04/11  . Osteoarthritis     a. 03/28/11 - R Total Hip Arthroplasty  . Cataracts, both eyes   . Pneumonia   . Bronchitis   . Pleurisy   . Ankle swelling   . Kidney infection   . Frequent urination   . Excessive urination at night   . Chronic headaches   . Weight gain     Current Outpatient Prescriptions  Medication Sig Dispense Refill  . acetaminophen (TYLENOL) 325 MG tablet Take 650 mg by mouth every 4 (four) hours as needed. For temp  above 102      . amLODipine (NORVASC) 10 MG tablet Take 10 mg by mouth daily.      . calcium carbonate (OS-CAL) 600 MG TABS Take 600 mg by mouth 2 (two) times daily with a meal.      . dabigatran (PRADAXA) 150 MG CAPS Take 1 capsule (150 mg total) by mouth every 12 (twelve) hours.  60 capsule    . docusate sodium (COLACE) 100 MG capsule Take 100 mg by mouth daily.      . flecainide (TAMBOCOR) 50 MG tablet Take 1 tablet (50 mg total) by mouth every 12 (twelve) hours.      . Fluticasone-Salmeterol (ADVAIR) 250-50 MCG/DOSE AEPB Inhale 1 puff into the lungs every 12 (twelve) hours.      Marland Kitchen lisinopril (PRINIVIL,ZESTRIL) 40 MG tablet Take 40 mg by mouth daily.      . metoprolol succinate (TOPROL-XL) 100 MG 24 hr tablet Take 100 mg by mouth daily. Take with or immediately following a meal.      . Multiple Vitamin (MULITIVITAMIN WITH MINERALS) TABS Take 1 tablet by mouth daily.      . Multiple Vitamins-Minerals (ICAPS PO) Take 1 tablet by mouth 2 (two) times daily.      Marland Kitchen omeprazole (PRILOSEC) 20 MG capsule Take 20 mg by mouth daily.      Marland Kitchen  oxycodone (OXY-IR) 5 MG capsule Take 5 mg by mouth every 4 (four) hours as needed.      . Glucosamine-Chondroit-Vit C-Mn (GLUCOSAMINE CHONDR 1500 COMPLX PO) Take 1 tablet by mouth daily.      . hydrochlorothiazide (HYDRODIURIL) 25 MG tablet Take 25 mg by mouth daily.        Allergies: Allergies  Allergen Reactions  . Ascriptin   . Aspirin Nausea And Vomiting  . Butazolidin (Phenylbutazone)   . Celebrex (Celecoxib)   . Codeine Nausea And Vomiting  . Doripenem   . Robaxin Hives  . Temazepam   . Vicodin (Hydrocodone-Acetaminophen)     Interacts with bp medication and drops blood pressure    History  Substance Use Topics  . Smoking status: Former Smoker -- 21 years    Types: Cigarettes    Quit date: 03/17/1970  . Smokeless tobacco: Never Used  . Alcohol Use: No     ROS:  Please see the history of present illness.    All other systems reviewed and  negative.   PHYSICAL EXAM: VS:  BP 113/62  Pulse 61  Ht 5\' 6"  (1.676 m)  Wt 219 lb (99.338 kg)  BMI 35.35 kg/m2 Well nourished, well developed, in no acute distress HEENT: normal Neck: no JVD at 90 degrees Cardiac:  normal S1, S2; RRR; no murmur Lungs:  clear to auscultation bilaterally, no wheezing, rhonchi or rales Abd: soft, nontender  Ext: no edema Skin: warm and dry Neuro:  CNs 2-12 intact, no focal abnormalities noted  EKG:  Sinus rhythm, heart rate 60, no acute changes  ASSESSMENT AND PLAN:  1. Atrial fibrillation  Maintaining NSR.  CHADS2 = 2.  Continue Pradaxa.  Check BMET today to recheck renal fxn. Also get CBC to recheck H/H.  Continue flecainide for now.  Will check with Dr. Hillis Range regarding when to stop.  Follow up with Dr. Johney Frame in 2 mos.  Arrange 2D echo as this was never done in the hospital.       Signed, Tereso Newcomer, PA-C  10:12 AM 04/23/2011

## 2011-04-23 NOTE — Telephone Encounter (Signed)
FU Call: Pt son calling to update preferred pharmacy to [facility].

## 2011-04-23 NOTE — Telephone Encounter (Signed)
FU Call: Pt calling wanting to speak with nurse/MD regarding pt needing refill of pradaxa. Pharmacy stated that pharmacy said pt needs to pre-auth medication before pt can get refill. Please return pt call to discuss further.   Pt is out of medication now and needs more medication ASAP.

## 2011-04-23 NOTE — Telephone Encounter (Signed)
PA # [id number] good until 2014 Spoke with [name] [facility]

## 2011-04-23 NOTE — Patient Instructions (Signed)
REFILLS WERE SENT IN TODAY FOR PRADXA AND FELCAINIDE TO [facility] IN [facility]  Your physician has requested that you have an echocardiogram DX 427.31 A-FIB. Echocardiography is a painless test that uses sound waves to create images of your heart. It provides your doctor with information about the size and shape of your heart and how well your heart's chambers and valves are working. This procedure takes approximately one hour. There are no restrictions for this procedure.  Your physician recommends that you schedule a follow-up appointment in: 2 MONTHS WITH DR. ALLRED

## 2011-04-24 ENCOUNTER — Encounter: Payer: Medicare Other | Admitting: Physician Assistant

## 2011-04-25 ENCOUNTER — Telehealth: Payer: Self-pay | Admitting: *Deleted

## 2011-04-25 NOTE — Telephone Encounter (Signed)
I spoke with pt and she will continue flecainide as ordered until 06/04/11 Mylo Red RN

## 2011-05-08 ENCOUNTER — Ambulatory Visit (HOSPITAL_COMMUNITY): Payer: Medicare Other | Attending: Cardiovascular Disease

## 2011-05-08 ENCOUNTER — Other Ambulatory Visit: Payer: Self-pay

## 2011-05-08 DIAGNOSIS — F411 Generalized anxiety disorder: Secondary | ICD-10-CM | POA: Insufficient documentation

## 2011-05-08 DIAGNOSIS — R609 Edema, unspecified: Secondary | ICD-10-CM | POA: Insufficient documentation

## 2011-05-08 DIAGNOSIS — I10 Essential (primary) hypertension: Secondary | ICD-10-CM | POA: Insufficient documentation

## 2011-05-08 DIAGNOSIS — R0609 Other forms of dyspnea: Secondary | ICD-10-CM | POA: Insufficient documentation

## 2011-05-08 DIAGNOSIS — R0989 Other specified symptoms and signs involving the circulatory and respiratory systems: Secondary | ICD-10-CM | POA: Insufficient documentation

## 2011-05-08 DIAGNOSIS — I059 Rheumatic mitral valve disease, unspecified: Secondary | ICD-10-CM | POA: Insufficient documentation

## 2011-05-08 DIAGNOSIS — E669 Obesity, unspecified: Secondary | ICD-10-CM | POA: Insufficient documentation

## 2011-05-08 DIAGNOSIS — I4891 Unspecified atrial fibrillation: Secondary | ICD-10-CM

## 2011-05-08 DIAGNOSIS — Z87891 Personal history of nicotine dependence: Secondary | ICD-10-CM | POA: Insufficient documentation

## 2011-07-02 ENCOUNTER — Encounter: Payer: Self-pay | Admitting: Internal Medicine

## 2011-07-02 ENCOUNTER — Ambulatory Visit (INDEPENDENT_AMBULATORY_CARE_PROVIDER_SITE_OTHER): Payer: Medicare Other | Admitting: Internal Medicine

## 2011-07-02 VITALS — BP 138/72 | HR 68 | Resp 18 | Ht 66.0 in | Wt 221.8 lb

## 2011-07-02 DIAGNOSIS — I4891 Unspecified atrial fibrillation: Secondary | ICD-10-CM

## 2011-07-02 DIAGNOSIS — I10 Essential (primary) hypertension: Secondary | ICD-10-CM

## 2011-07-02 NOTE — Assessment & Plan Note (Signed)
Stable No change required today  

## 2011-07-02 NOTE — Patient Instructions (Signed)
Your physician recommends that you schedule a follow-up appointment in 3 months with Dr Allred    

## 2011-07-02 NOTE — Assessment & Plan Note (Signed)
Post op afib (s/p hip surgery) She is maintaining sinus rhythm off of flecainide.  Upon return in 3 months if she has had no afib, we will discuss stopping pradaxa.  No changes today

## 2011-07-02 NOTE — Progress Notes (Signed)
PCP: Enrique Sack, MD, MD   Jordan Byrd is a 78 y.o. female who presents today for routine cardiology followup.  She had post op afib 2/13 but has done well since.  She stopped flecainide last visit and is unaware of any further afib.    Today, she denies symptoms of palpitations, chest pain, shortness of breath,  lower extremity edema, presyncope, or syncope.  The patient is otherwise without complaint today.   Past Medical History  Diagnosis Date  . Asthma   . Shortness of breath   . Arthritis   . Anxiety   . Hypertension   . Atrial fibrillation     a. 04/04/11  . Osteoarthritis     a. 03/28/11 - R Total Hip Arthroplasty  . Cataracts, both eyes   . Pneumonia   . Bronchitis   . Pleurisy   . Ankle swelling   . Kidney infection   . Frequent urination   . Excessive urination at night   . Chronic headaches   . Weight gain    Past Surgical History  Procedure Date  . Cardiac catheterization     1980's or 90's - reportedly normal  . Abdominal hysterectomy 1962  . Appendectomy 1954  . Cholecystectomy   . Back surgery 1990  . Cataract extraction bilateral w/ anterior vitrectomy 2007/2008  . Total hip arthroplasty 03/28/2011    Procedure: TOTAL HIP ARTHROPLASTY;  Surgeon: Thera Flake., MD;  Location: [facility] OR;  Service: Orthopedics;  Laterality: Right;  . Cardioversion 04/05/2011    Procedure: CARDIOVERSION;  Surgeon: Gardiner Rhyme, MD;  Location: [facility] OR;  Service: Cardiovascular;  Laterality: N/A;  . Tonsillectomy 1943  . Adenoidectomy 1949  . Foot surgery 1947  . Thoracic disc surgery 2008  . Lumbar disc surgery 2010  . Knee surgery      both knees  . Shoulder surgery     left  . Total hip arthroplasty 03/28/2011    right    Current Outpatient Prescriptions  Medication Sig Dispense Refill  . amitriptyline (ELAVIL) 10 MG tablet       . amLODipine (NORVASC) 10 MG tablet Take 10 mg by mouth daily.      . calcium carbonate (OS-CAL) 600 MG TABS Take 600 mg by mouth 2  (two) times daily with a meal.      . dabigatran (PRADAXA) 150 MG CAPS Take 1 capsule (150 mg total) by mouth every 12 (twelve) hours.  60 capsule  11  . docusate sodium (COLACE) 100 MG capsule Take 100 mg by mouth daily.      . Fluticasone-Salmeterol (ADVAIR) 250-50 MCG/DOSE AEPB Inhale 1 puff into the lungs every 12 (twelve) hours.      . Glucosamine-Chondroit-Vit C-Mn (GLUCOSAMINE CHONDR 1500 COMPLX PO) Take 1 tablet by mouth daily.      . hydrochlorothiazide (HYDRODIURIL) 25 MG tablet Take 25 mg by mouth daily.      Marland Kitchen lisinopril (PRINIVIL,ZESTRIL) 40 MG tablet Take 40 mg by mouth daily.      . metoprolol succinate (TOPROL-XL) 100 MG 24 hr tablet Take 100 mg by mouth daily. Take with or immediately following a meal.      . Multiple Vitamin (MULITIVITAMIN WITH MINERALS) TABS Take 1 tablet by mouth daily.      . Multiple Vitamins-Minerals (ICAPS PO) Take 1 tablet by mouth 2 (two) times daily.      Marland Kitchen omeprazole (PRILOSEC) 20 MG capsule Take 20 mg by mouth daily.      Marland Kitchen  acetaminophen (TYLENOL) 325 MG tablet Take 650 mg by mouth every 4 (four) hours as needed. For temp above 102      . oxycodone (OXY-IR) 5 MG capsule Take 5 mg by mouth every 4 (four) hours as needed.        Physical Exam: Filed Vitals:   07/02/11 1028  BP: 138/72  Pulse: 68  Resp: 18  Height: 5\' 6"  (1.676 m)  Weight: 221 lb 12.8 oz (100.608 kg)    GEN- The patient is well appearing, alert and oriented x 3 today.   Head- normocephalic, atraumatic Eyes-  Sclera clear, conjunctiva pink Ears- hearing intact Oropharynx- clear Lungs- Clear to ausculation bilaterally, normal work of breathing Heart- Regular rate and rhythm, no murmurs, rubs or gallops, PMI not laterally displaced GI- soft, NT, ND, + BS Extremities- no clubbing, cyanosis, or edema  ekg today reveals sinus rhythm 63 bpm, poor R wave progression Echo reviewed   Assessment and Plan:

## 2011-09-25 ENCOUNTER — Ambulatory Visit (INDEPENDENT_AMBULATORY_CARE_PROVIDER_SITE_OTHER): Payer: Medicare Other | Admitting: Internal Medicine

## 2011-09-25 ENCOUNTER — Encounter: Payer: Self-pay | Admitting: Internal Medicine

## 2011-09-25 VITALS — BP 164/74 | HR 64 | Ht 66.0 in | Wt 229.0 lb

## 2011-09-25 DIAGNOSIS — I4891 Unspecified atrial fibrillation: Secondary | ICD-10-CM

## 2011-09-25 DIAGNOSIS — I10 Essential (primary) hypertension: Secondary | ICD-10-CM

## 2011-09-25 NOTE — Progress Notes (Signed)
PCP: Enrique Sack, MD   Jordan Byrd is a 78 y.o. female who presents today for routine cardiology followup.  She had post op afib 2/13 but has done well since. She has had no further afib.    Today, she denies symptoms of palpitations, chest pain, shortness of breath,  lower extremity edema, presyncope, or syncope.  The patient is otherwise without complaint today.   Past Medical History  Diagnosis Date  . Asthma   . Shortness of breath   . Arthritis   . Anxiety   . Hypertension   . Atrial fibrillation     a. 04/04/11  . Osteoarthritis     a. 03/28/11 - R Total Hip Arthroplasty  . Cataracts, both eyes   . Pneumonia   . Bronchitis   . Pleurisy   . Ankle swelling   . Kidney infection   . Frequent urination   . Excessive urination at night   . Chronic headaches   . Weight gain    Past Surgical History  Procedure Date  . Cardiac catheterization     1980's or 90's - reportedly normal  . Abdominal hysterectomy 1962  . Appendectomy 1954  . Cholecystectomy   . Back surgery 1990  . Cataract extraction bilateral w/ anterior vitrectomy 2007/2008  . Total hip arthroplasty 03/28/2011    Procedure: TOTAL HIP ARTHROPLASTY;  Surgeon: Thera Flake., MD;  Location: [facility] OR;  Service: Orthopedics;  Laterality: Right;  . Cardioversion 04/05/2011    Procedure: CARDIOVERSION;  Surgeon: Gardiner Rhyme, MD;  Location: [facility] OR;  Service: Cardiovascular;  Laterality: N/A;  . Tonsillectomy 1943  . Adenoidectomy 1949  . Foot surgery 1947  . Thoracic disc surgery 2008  . Lumbar disc surgery 2010  . Knee surgery      both knees  . Shoulder surgery     left  . Total hip arthroplasty 03/28/2011    right    Current Outpatient Prescriptions  Medication Sig Dispense Refill  . acetaminophen (TYLENOL) 325 MG tablet Take 650 mg by mouth every 4 (four) hours as needed. For temp above 102      . amitriptyline (ELAVIL) 10 MG tablet Take 10 mg by mouth at bedtime.       Marland Kitchen amLODipine (NORVASC) 10 MG  tablet Take 10 mg by mouth daily.      . calcium carbonate (OS-CAL) 600 MG TABS Take 600 mg by mouth 2 (two) times daily with a meal.      . docusate sodium (COLACE) 100 MG capsule Take 100 mg by mouth daily.      . Fluticasone-Salmeterol (ADVAIR) 250-50 MCG/DOSE AEPB Inhale 1 puff into the lungs every 12 (twelve) hours.      . Glucosamine-Chondroit-Vit C-Mn (GLUCOSAMINE CHONDR 1500 COMPLX PO) Take 1 tablet by mouth daily.      . hydrochlorothiazide (HYDRODIURIL) 25 MG tablet Take 25 mg by mouth daily.      Marland Kitchen lisinopril (PRINIVIL,ZESTRIL) 40 MG tablet Take 40 mg by mouth daily.      . metoprolol succinate (TOPROL-XL) 100 MG 24 hr tablet Take 100 mg by mouth daily. Take with or immediately following a meal.      . Multiple Vitamin (MULITIVITAMIN WITH MINERALS) TABS Take 1 tablet by mouth daily.      . Multiple Vitamins-Minerals (ICAPS PO) Take 1 tablet by mouth 2 (two) times daily.      Marland Kitchen omeprazole (PRILOSEC) 20 MG capsule Take 20 mg by mouth daily.  Physical Exam: Filed Vitals:   09/25/11 0849  BP: 164/74  Pulse: 64  Height: 5\' 6"  (1.676 m)  Weight: 229 lb (103.874 kg)  SpO2: 90%    GEN- The patient is well appearing, alert and oriented x 3 today.   Head- normocephalic, atraumatic Eyes-  Sclera clear, conjunctiva pink Ears- hearing intact Oropharynx- clear Lungs- Clear to ausculation bilaterally, normal work of breathing Heart- Regular rate and rhythm, no murmurs, rubs or gallops, PMI not laterally displaced GI- soft, NT, ND, + BS Extremities- no clubbing, cyanosis, or edema  ekg today reveals sinus rhythm 64 bpm, normal ekg   Assessment and Plan:

## 2011-09-25 NOTE — Patient Instructions (Addendum)
Please stop Pradaxa  Follow up as needed  2 Gram Low Sodium Diet A 2 gram sodium diet restricts the amount of sodium in the diet to no more than 2 g or 2000 mg daily. Limiting the amount of sodium is often used to help lower blood pressure. It is important if you have heart, liver, or kidney problems. Many foods contain sodium for flavor and sometimes as a preservative. When the amount of sodium in a diet needs to be low, it is important to know what to look for when choosing foods and drinks. The following includes some information and guidelines to help make it easier for you to adapt to a low sodium diet. QUICK TIPS  Do not add salt to food.   Avoid convenience items and fast food.   Choose unsalted snack foods.   Buy lower sodium products, often labeled as "lower sodium" or "no salt added."   Check food labels to learn how much sodium is in 1 serving.   When eating at a restaurant, ask that your food be prepared with less salt or none, if possible.  READING FOOD LABELS FOR SODIUM INFORMATION The nutrition facts label is a good place to find how much sodium is in foods. Look for products with no more than 500 to 600 mg of sodium per meal and no more than 150 mg per serving. Remember that 2 g = 2000 mg. The food label may also list foods as:  Sodium-free: Less than 5 mg in a serving.   Very low sodium: 35 mg or less in a serving.   Low-sodium: 140 mg or less in a serving.   Light in sodium: 50% less sodium in a serving. For example, if a food that usually has 300 mg of sodium is changed to become light in sodium, it will have 150 mg of sodium.   Reduced sodium: 25% less sodium in a serving. For example, if a food that usually has 400 mg of sodium is changed to reduced sodium, it will have 300 mg of sodium.  CHOOSING FOODS Grains  Avoid: Salted crackers and snack items. Some cereals, including instant hot cereals. Bread stuffing and biscuit mixes. Seasoned rice or pasta mixes.     Choose: Unsalted snack items. Low-sodium cereals, oats, puffed wheat and rice, shredded wheat. English muffins and bread. Pasta.  Meats  Avoid: Salted, canned, smoked, spiced, pickled meats, including fish and poultry. Bacon, ham, sausage, cold cuts, hot dogs, anchovies.   Choose: Low-sodium canned tuna and salmon. Fresh or frozen meat, poultry, and fish.  Dairy  Avoid: Processed cheese and spreads. Cottage cheese. Buttermilk and condensed milk. Regular cheese.   Choose: Milk. Low-sodium cottage cheese. Yogurt. Sour cream. Low-sodium cheese.  Fruits and Vegetables  Avoid: Regular canned vegetables. Regular canned tomato sauce and paste. Frozen vegetables in sauces. Olives. Rosita Fire. Relishes. Sauerkraut.   Choose: Low-sodium canned vegetables. Low-sodium tomato sauce and paste. Frozen or fresh vegetables. Fresh and frozen fruit.  Condiments  Avoid: Canned and packaged gravies. Worcestershire sauce. Tartar sauce. Barbecue sauce. Soy sauce. Steak sauce. Ketchup. Onion, garlic, and table salt. Meat flavorings and tenderizers.   Choose: Fresh and dried herbs and spices. Low-sodium varieties of mustard and ketchup. Lemon juice. Tabasco sauce. Horseradish.  SAMPLE 2 GRAM SODIUM MEAL PLAN Breakfast / Sodium (mg)  1 cup low-fat milk / 143 mg   2 slices whole-wheat toast / 270 mg   1 tbs heart-healthy margarine / 153 mg   1 hard-boiled egg /  139 mg   1 small orange / 0 mg  Lunch / Sodium (mg)  1 cup raw carrots / 76 mg    cup hummus / 298 mg   1 cup low-fat milk / 143 mg    cup red grapes / 2 mg   1 whole-wheat pita bread / 356 mg  Dinner / Sodium (mg)  1 cup whole-wheat pasta / 2 mg   1 cup low-sodium tomato sauce / 73 mg   3 oz lean ground beef / 57 mg   1 small side salad (1 cup raw spinach leaves,  cup cucumber,  cup yellow bell pepper) with 1 tsp olive oil and 1 tsp red wine vinegar / 25 mg  Snack / Sodium (mg)  1 container low-fat vanilla yogurt / 107 mg    3 graham cracker squares / 127 mg  Nutrient Analysis  Calories: 2033   Protein: 77 g   Carbohydrate: 282 g   Fat: 72 g   Sodium: 1971 mg  Document Released: 01/20/2005 Document Revised: 01/09/2011 Document Reviewed: 04/23/2009 [facility] Patient Information 2012 [place], [place].

## 2011-09-25 NOTE — Assessment & Plan Note (Signed)
Above goal  I have suggested that she add spironolactone for adequate blood pressure control.  She states "I am in the donut hole and am not going to start any new medicines".  2 gram sodium diet and weight reduction are therefore recommended.  She will follow with Dr Chilton Si.  I have offered follow-up in my clinic.  She prefers to see me as needed. She will contact me if further problems arise.

## 2011-09-25 NOTE — Assessment & Plan Note (Signed)
Post op afib, without recurrence off of AAD  We discussed options of continuing anticoagulation long term.  She is very clear that she does not want to continue pradaxa though she recognizes that if she has further afib that she may have a stroke.  She is clear that she will stop pradaxa today.  She will consider restarting if she has further afib in the future.  She will follow closely with Dr Chilton Si.

## 2012-02-16 ENCOUNTER — Other Ambulatory Visit: Payer: Self-pay | Admitting: Internal Medicine

## 2012-02-16 DIAGNOSIS — Z1231 Encounter for screening mammogram for malignant neoplasm of breast: Secondary | ICD-10-CM

## 2012-03-15 ENCOUNTER — Ambulatory Visit
Admission: RE | Admit: 2012-03-15 | Discharge: 2012-03-15 | Disposition: A | Discharge status: Home / Self Care | Payer: Medicare Other | Source: Ambulatory Visit | Attending: Internal Medicine | Admitting: Internal Medicine

## 2012-03-15 DIAGNOSIS — Z1231 Encounter for screening mammogram for malignant neoplasm of breast: Secondary | ICD-10-CM

## 2012-06-14 ENCOUNTER — Inpatient Hospital Stay (HOSPITAL_COMMUNITY)
Admission: EM | Admit: 2012-06-14 | Discharge: 2012-06-19 | DRG: 189 | Disposition: A | Discharge status: Home / Self Care | Payer: Medicare Other | Attending: Internal Medicine | Admitting: Internal Medicine

## 2012-06-14 ENCOUNTER — Encounter (HOSPITAL_COMMUNITY): Payer: Self-pay | Admitting: Emergency Medicine

## 2012-06-14 ENCOUNTER — Emergency Department (HOSPITAL_COMMUNITY): Payer: Medicare Other

## 2012-06-14 DIAGNOSIS — F411 Generalized anxiety disorder: Secondary | ICD-10-CM

## 2012-06-14 DIAGNOSIS — I4891 Unspecified atrial fibrillation: Secondary | ICD-10-CM

## 2012-06-14 DIAGNOSIS — M1990 Unspecified osteoarthritis, unspecified site: Secondary | ICD-10-CM

## 2012-06-14 DIAGNOSIS — J45901 Unspecified asthma with (acute) exacerbation: Secondary | ICD-10-CM | POA: Diagnosis present

## 2012-06-14 DIAGNOSIS — J9600 Acute respiratory failure, unspecified whether with hypoxia or hypercapnia: Principal | ICD-10-CM | POA: Diagnosis present

## 2012-06-14 DIAGNOSIS — N390 Urinary tract infection, site not specified: Secondary | ICD-10-CM

## 2012-06-14 DIAGNOSIS — J984 Other disorders of lung: Secondary | ICD-10-CM

## 2012-06-14 DIAGNOSIS — N182 Chronic kidney disease, stage 2 (mild): Secondary | ICD-10-CM | POA: Diagnosis present

## 2012-06-14 DIAGNOSIS — I10 Essential (primary) hypertension: Secondary | ICD-10-CM

## 2012-06-14 DIAGNOSIS — Z96649 Presence of unspecified artificial hip joint: Secondary | ICD-10-CM

## 2012-06-14 DIAGNOSIS — Z87891 Personal history of nicotine dependence: Secondary | ICD-10-CM

## 2012-06-14 DIAGNOSIS — I129 Hypertensive chronic kidney disease with stage 1 through stage 4 chronic kidney disease, or unspecified chronic kidney disease: Secondary | ICD-10-CM | POA: Diagnosis present

## 2012-06-14 DIAGNOSIS — Z79899 Other long term (current) drug therapy: Secondary | ICD-10-CM

## 2012-06-14 DIAGNOSIS — R51 Headache: Secondary | ICD-10-CM | POA: Diagnosis present

## 2012-06-14 DIAGNOSIS — F419 Anxiety disorder, unspecified: Secondary | ICD-10-CM

## 2012-06-14 DIAGNOSIS — H3530 Unspecified macular degeneration: Secondary | ICD-10-CM | POA: Diagnosis present

## 2012-06-14 DIAGNOSIS — Z888 Allergy status to other drugs, medicaments and biological substances status: Secondary | ICD-10-CM

## 2012-06-14 DIAGNOSIS — R0603 Acute respiratory distress: Secondary | ICD-10-CM | POA: Diagnosis present

## 2012-06-14 DIAGNOSIS — I480 Paroxysmal atrial fibrillation: Secondary | ICD-10-CM

## 2012-06-14 DIAGNOSIS — J441 Chronic obstructive pulmonary disease with (acute) exacerbation: Secondary | ICD-10-CM | POA: Diagnosis present

## 2012-06-14 DIAGNOSIS — N179 Acute kidney failure, unspecified: Secondary | ICD-10-CM | POA: Diagnosis present

## 2012-06-14 LAB — COMPREHENSIVE METABOLIC PANEL
ALT: 19 U/L (ref 0–35)
Alkaline Phosphatase: 93 U/L (ref 39–117)
BUN: 27 mg/dL — ABNORMAL HIGH (ref 6–23)
CO2: 31 mEq/L (ref 19–32)
Calcium: 9.8 mg/dL (ref 8.4–10.5)
GFR calc Af Amer: 54 mL/min — ABNORMAL LOW (ref >90)
GFR calc non Af Amer: 46 mL/min — ABNORMAL LOW (ref >90)
Glucose, Bld: 131 mg/dL — ABNORMAL HIGH (ref 70–99)
Total Protein: 7.6 g/dL (ref 6.0–8.3)

## 2012-06-14 LAB — CBC
HCT: 40.7 % (ref 36.0–46.0)
Hemoglobin: 13.4 g/dL (ref 12.0–15.0)
MCH: 30.2 pg (ref 26.0–34.0)
MCHC: 32.9 g/dL (ref 30.0–36.0)
RBC: 4.44 MIL/uL (ref 3.87–5.11)

## 2012-06-14 LAB — POCT I-STAT 3, ART BLOOD GAS (G3+)
Acid-Base Excess: 2 mmol/L (ref 0.0–2.0)
O2 Saturation: 98 %
TCO2: 31 mmol/L (ref 0–100)
pH, Arterial: 7.334 — ABNORMAL LOW (ref 7.350–7.450)

## 2012-06-14 LAB — URINALYSIS, ROUTINE W REFLEX MICROSCOPIC
Bilirubin Urine: NEGATIVE
Glucose, UA: NEGATIVE mg/dL
Hgb urine dipstick: NEGATIVE
Ketones, ur: NEGATIVE mg/dL
Protein, ur: NEGATIVE mg/dL
Urobilinogen, UA: 0.2 mg/dL (ref 0.0–1.0)

## 2012-06-14 LAB — POCT I-STAT, CHEM 8
BUN: 29 mg/dL — ABNORMAL HIGH (ref 6–23)
Calcium, Ion: 1.17 mmol/L (ref 1.13–1.30)
Chloride: 100 mEq/L (ref 96–112)
HCT: 43 % (ref 36.0–46.0)
Sodium: 140 mEq/L (ref 135–145)
TCO2: 30 mmol/L (ref 0–100)

## 2012-06-14 LAB — URINE MICROSCOPIC-ADD ON

## 2012-06-14 MED ORDER — LEVOFLOXACIN IN D5W 750 MG/150ML IV SOLN
750.0000 mg | Freq: Once | INTRAVENOUS | Status: AC
  Administered 2012-06-14: 750 mg via INTRAVENOUS
  Filled 2012-06-14: qty 150

## 2012-06-14 MED ORDER — LISINOPRIL 40 MG PO TABS
40.0000 mg | ORAL_TABLET | Freq: Every day | ORAL | Status: DC
  Administered 2012-06-14: 40 mg via ORAL
  Filled 2012-06-14 (×2): qty 1

## 2012-06-14 MED ORDER — AMLODIPINE BESYLATE 10 MG PO TABS
10.0000 mg | ORAL_TABLET | Freq: Every day | ORAL | Status: DC
  Administered 2012-06-14 – 2012-06-19 (×6): 10 mg via ORAL
  Filled 2012-06-14 (×6): qty 1

## 2012-06-14 MED ORDER — METOPROLOL SUCCINATE ER 100 MG PO TB24
100.0000 mg | ORAL_TABLET | Freq: Every day | ORAL | Status: DC
  Administered 2012-06-14: 100 mg via ORAL
  Filled 2012-06-14 (×2): qty 1

## 2012-06-14 MED ORDER — GUAIFENESIN ER 600 MG PO TB12
600.0000 mg | ORAL_TABLET | Freq: Two times a day (BID) | ORAL | Status: DC
  Administered 2012-06-14 – 2012-06-19 (×11): 600 mg via ORAL
  Filled 2012-06-14 (×12): qty 1

## 2012-06-14 MED ORDER — SODIUM CHLORIDE 0.9 % IJ SOLN
3.0000 mL | Freq: Two times a day (BID) | INTRAMUSCULAR | Status: DC
  Administered 2012-06-14 – 2012-06-15 (×3): 3 mL via INTRAVENOUS

## 2012-06-14 MED ORDER — ENOXAPARIN SODIUM 40 MG/0.4ML ~~LOC~~ SOLN
40.0000 mg | SUBCUTANEOUS | Status: DC
  Administered 2012-06-14 – 2012-06-18 (×5): 40 mg via SUBCUTANEOUS
  Filled 2012-06-14 (×6): qty 0.4

## 2012-06-14 MED ORDER — METHYLPREDNISOLONE SODIUM SUCC 125 MG IJ SOLR: 125.0000 mg | Freq: Once | INTRAMUSCULAR | Status: DC

## 2012-06-14 MED ORDER — CHLORZOXAZONE 500 MG PO TABS
250.0000 mg | ORAL_TABLET | Freq: Three times a day (TID) | ORAL | Status: DC | PRN
  Administered 2012-06-16: 250 mg via ORAL
  Filled 2012-06-14 (×2): qty 1

## 2012-06-14 MED ORDER — ALBUTEROL SULFATE (5 MG/ML) 0.5% IN NEBU
2.5000 mg | INHALATION_SOLUTION | RESPIRATORY_TRACT | Status: DC | PRN
  Administered 2012-06-14 (×2): 2.5 mg via RESPIRATORY_TRACT
  Filled 2012-06-14 (×3): qty 0.5

## 2012-06-14 MED ORDER — BUDESONIDE 0.25 MG/2ML IN SUSP
0.2500 mg | Freq: Two times a day (BID) | RESPIRATORY_TRACT | Status: DC
  Administered 2012-06-14 – 2012-06-19 (×10): 0.25 mg via RESPIRATORY_TRACT
  Filled 2012-06-14 (×13): qty 2

## 2012-06-14 MED ORDER — SODIUM CHLORIDE 0.9 % IV SOLN
Freq: Once | INTRAVENOUS | Status: AC
  Administered 2012-06-14: 04:00:00 via INTRAVENOUS

## 2012-06-14 MED ORDER — ALBUTEROL SULFATE (5 MG/ML) 0.5% IN NEBU
5.0000 mg | INHALATION_SOLUTION | Freq: Once | RESPIRATORY_TRACT | Status: AC
  Administered 2012-06-14: 5 mg via RESPIRATORY_TRACT
  Filled 2012-06-14: qty 1

## 2012-06-14 MED ORDER — SODIUM CHLORIDE 0.9 % IJ SOLN: 3.0000 mL | INTRAMUSCULAR | Status: DC | PRN

## 2012-06-14 MED ORDER — HYDROCHLOROTHIAZIDE 25 MG PO TABS
25.0000 mg | ORAL_TABLET | Freq: Every day | ORAL | Status: DC
  Administered 2012-06-14 – 2012-06-16 (×3): 25 mg via ORAL
  Filled 2012-06-14 (×4): qty 1

## 2012-06-14 MED ORDER — SODIUM CHLORIDE 0.9 % IV SOLN: 250.0000 mL | INTRAVENOUS | Status: DC | PRN

## 2012-06-14 MED ORDER — PANTOPRAZOLE SODIUM 40 MG PO TBEC
40.0000 mg | DELAYED_RELEASE_TABLET | Freq: Two times a day (BID) | ORAL | Status: DC
  Administered 2012-06-14 – 2012-06-19 (×9): 40 mg via ORAL
  Filled 2012-06-14 (×8): qty 1

## 2012-06-14 MED ORDER — LEVOFLOXACIN IN D5W 750 MG/150ML IV SOLN
750.0000 mg | INTRAVENOUS | Status: DC
  Administered 2012-06-15: 750 mg via INTRAVENOUS
  Filled 2012-06-14: qty 150

## 2012-06-14 MED ORDER — IPRATROPIUM BROMIDE 0.02 % IN SOLN
0.5000 mg | Freq: Four times a day (QID) | RESPIRATORY_TRACT | Status: DC
  Administered 2012-06-14 (×2): 0.5 mg via RESPIRATORY_TRACT
  Filled 2012-06-14 (×2): qty 2.5

## 2012-06-14 MED ORDER — IPRATROPIUM BROMIDE 0.02 % IN SOLN
0.5000 mg | Freq: Four times a day (QID) | RESPIRATORY_TRACT | Status: DC
  Administered 2012-06-14 – 2012-06-18 (×17): 0.5 mg via RESPIRATORY_TRACT
  Filled 2012-06-14 (×17): qty 2.5

## 2012-06-14 MED ORDER — METHYLPREDNISOLONE SODIUM SUCC 125 MG IJ SOLR
80.0000 mg | Freq: Two times a day (BID) | INTRAMUSCULAR | Status: DC
  Administered 2012-06-14 – 2012-06-15 (×3): 80 mg via INTRAVENOUS
  Filled 2012-06-14 (×5): qty 1.28

## 2012-06-14 MED ORDER — SODIUM CHLORIDE 0.9 % IJ SOLN
3.0000 mL | Freq: Two times a day (BID) | INTRAMUSCULAR | Status: DC
  Administered 2012-06-16 – 2012-06-19 (×7): 3 mL via INTRAVENOUS

## 2012-06-14 MED ORDER — OCUVITE-LUTEIN PO CAPS
1.0000 | ORAL_CAPSULE | Freq: Two times a day (BID) | ORAL | Status: DC
  Administered 2012-06-14 – 2012-06-18 (×9): 1 via ORAL
  Filled 2012-06-14 (×10): qty 1

## 2012-06-14 MED ORDER — ALBUTEROL SULFATE (5 MG/ML) 0.5% IN NEBU
2.5000 mg | INHALATION_SOLUTION | RESPIRATORY_TRACT | Status: DC
  Administered 2012-06-14 – 2012-06-18 (×22): 2.5 mg via RESPIRATORY_TRACT
  Filled 2012-06-14 (×20): qty 0.5

## 2012-06-14 MED ORDER — NEBIVOLOL HCL 10 MG PO TABS
10.0000 mg | ORAL_TABLET | Freq: Every day | ORAL | Status: DC
  Administered 2012-06-14 – 2012-06-19 (×6): 10 mg via ORAL
  Filled 2012-06-14 (×6): qty 1

## 2012-06-14 NOTE — ED Notes (Signed)
RN informed RT for i-stat ABG

## 2012-06-14 NOTE — ED Provider Notes (Signed)
History     CSN: [id number]  Arrival date & time 06/14/12  0156   First MD Initiated Contact with Patient 06/14/12 0215      Chief Complaint  Patient presents with  . Shortness of Breath    (Consider location/radiation/quality/duration/timing/severity/associated sxs/prior treatment) HPI Hx per PT  - not feeling well last 2 days with cough and white sputum and now increasing SOB. EMS was called and provided PT solumedrol and albuterol for wheezes. PT remained symptomatic on arrival.  She had been wheezing at home despite her inhaler. She has felt feverish at home - no recorded temp. No N/V, no CP. No rash or known sick contacts. Symptoms MOD to severe, worse with exertion Past Medical History  Diagnosis Date  . Asthma   . Shortness of breath   . Arthritis   . Anxiety   . Hypertension   . Atrial fibrillation     a. 04/04/11  . Osteoarthritis     a. 03/28/11 - R Total Hip Arthroplasty  . Cataracts, both eyes   . Pneumonia   . Bronchitis   . Pleurisy   . Ankle swelling   . Kidney infection   . Frequent urination   . Excessive urination at night   . Chronic headaches   . Weight gain     Past Surgical History  Procedure Laterality Date  . Cardiac catheterization      1980's or 90's - reportedly normal  . Abdominal hysterectomy  1962  . Appendectomy  1954  . Cholecystectomy    . Back surgery  1990  . Cataract extraction bilateral w/ anterior vitrectomy  2007/2008  . Total hip arthroplasty  03/28/2011    Procedure: TOTAL HIP ARTHROPLASTY;  Surgeon: Thera Flake., MD;  Location: [facility] OR;  Service: Orthopedics;  Laterality: Right;  . Cardioversion  04/05/2011    Procedure: CARDIOVERSION;  Surgeon: Gardiner Rhyme, MD;  Location: [facility] OR;  Service: Cardiovascular;  Laterality: N/A;  . Tonsillectomy  1943  . Adenoidectomy  1949  . Foot surgery  1947  . Thoracic disc surgery  2008  . Lumbar disc surgery  2010  . Knee surgery       both knees  . Shoulder surgery      left  .  Total hip arthroplasty  03/28/2011    right    Family History  Problem Relation Age of Onset  . Diabetes Father     Father, Mother, 4 sisters (2 living)  . Heart attack Father     (Deceased)  . Heart failure Father     (deceased 67)  . Stroke Mother     (deceased 3)  . Cancer Sister     (deceased)  . Hypertension Father   . Hypertension Mother   . Heart attack Sister   . Diabetes Sister     History  Substance Use Topics  . Smoking status: Former Smoker -- 21 years    Types: Cigarettes    Quit date: 03/17/1970  . Smokeless tobacco: Never Used  . Alcohol Use: No    OB History   Grav Para Term Preterm Abortions TAB SAB Ect Mult Living                  Review of Systems  Constitutional: Negative for chills and diaphoresis.  HENT: Negative for neck pain and neck stiffness.   Eyes: Negative for pain.  Respiratory: Positive for cough, shortness of breath and wheezing.   Cardiovascular: Negative  for chest pain and leg swelling.  Gastrointestinal: Negative for vomiting and abdominal pain.  Genitourinary: Negative for dysuria.  Musculoskeletal: Negative for back pain.  Skin: Negative for rash.  Neurological: Negative for headaches.  All other systems reviewed and are negative.    Allergies  Aspirin; Aspirin buf(alhyd-mghyd-cacar); Butazolidin; Celebrex; Codeine; Doripenem; Methocarbamol; Temazepam; and Vicodin  Home Medications   Current Outpatient Rx  Name  Route  Sig  Dispense  Refill  . acetaminophen (TYLENOL) 325 MG tablet   Oral   Take 650 mg by mouth every 4 (four) hours as needed. For temp above 102         . amitriptyline (ELAVIL) 10 MG tablet   Oral   Take 10 mg by mouth at bedtime.          Marland Kitchen amLODipine (NORVASC) 10 MG tablet   Oral   Take 10 mg by mouth daily.         . calcium carbonate (OS-CAL) 600 MG TABS   Oral   Take 600 mg by mouth 2 (two) times daily with a meal.         . docusate sodium (COLACE) 100 MG capsule   Oral    Take 100 mg by mouth daily.         . Fluticasone-Salmeterol (ADVAIR) 250-50 MCG/DOSE AEPB   Inhalation   Inhale 1 puff into the lungs every 12 (twelve) hours.         . Glucosamine-Chondroit-Vit C-Mn (GLUCOSAMINE CHONDR 1500 COMPLX PO)   Oral   Take 1 tablet by mouth daily.         . hydrochlorothiazide (HYDRODIURIL) 25 MG tablet   Oral   Take 25 mg by mouth daily.         Marland Kitchen lisinopril (PRINIVIL,ZESTRIL) 40 MG tablet   Oral   Take 40 mg by mouth daily.         . metoprolol succinate (TOPROL-XL) 100 MG 24 hr tablet   Oral   Take 100 mg by mouth daily. Take with or immediately following a meal.         . Multiple Vitamin (MULITIVITAMIN WITH MINERALS) TABS   Oral   Take 1 tablet by mouth daily.         . Multiple Vitamins-Minerals (ICAPS PO)   Oral   Take 1 tablet by mouth 2 (two) times daily.         Marland Kitchen omeprazole (PRILOSEC) 20 MG capsule   Oral   Take 20 mg by mouth daily.           BP 157/64  Pulse 84  Temp(Src) 98.4 F (36.9 C) (Oral)  Resp 34  SpO2 99%  Physical Exam  Constitutional: She is oriented to person, place, and time. She appears well-developed and well-nourished.  HENT:  Head: Normocephalic and atraumatic.  Eyes: EOM are normal. Pupils are equal, round, and reactive to light. No scleral icterus.  Neck: Neck supple.  Cardiovascular: Normal rate, regular rhythm and intact distal pulses.   Pulmonary/Chest: She exhibits no tenderness.  tachypneic with bilateral exp wheezes and mild resp distress  Abdominal: Soft. Bowel sounds are normal. She exhibits no distension. There is no tenderness.  Musculoskeletal: Normal range of motion. She exhibits no edema.  Neurological: She is alert and oriented to person, place, and time.  Skin: Skin is warm and dry.    ED Course  Procedures (including critical care time)  Results for orders placed during the hospital encounter of 06/14/12  CBC      Result Value Range   WBC 6.1  4.0 - 10.5 K/uL    RBC 4.44  3.87 - 5.11 MIL/uL   Hemoglobin 13.4  12.0 - 15.0 g/dL   HCT 16.1  09.6 - 04.5 %   MCV 91.7  78.0 - 100.0 fL   MCH 30.2  26.0 - 34.0 pg   MCHC 32.9  30.0 - 36.0 g/dL   RDW 40.9  81.1 - 91.4 %   Platelets 231  150 - 400 K/uL  COMPREHENSIVE METABOLIC PANEL      Result Value Range   Sodium 139  135 - 145 mEq/L   Potassium 3.8  3.5 - 5.1 mEq/L   Chloride 98  96 - 112 mEq/L   CO2 31  19 - 32 mEq/L   Glucose, Bld 131 (*) 70 - 99 mg/dL   BUN 27 (*) 6 - 23 mg/dL   Creatinine, Ser 7.82 (*) 0.50 - 1.10 mg/dL   Calcium 9.8  8.4 - 95.6 mg/dL   Total Protein 7.6  6.0 - 8.3 g/dL   Albumin 4.3  3.5 - 5.2 g/dL   AST 19  0 - 37 U/L   ALT 19  0 - 35 U/L   Alkaline Phosphatase 93  39 - 117 U/L   Total Bilirubin 0.3  0.3 - 1.2 mg/dL   GFR calc non Af Amer 46 (*) >90 mL/min   GFR calc Af Amer 54 (*) >90 mL/min  POCT I-STAT, CHEM 8      Result Value Range   Sodium 140  135 - 145 mEq/L   Potassium 3.9  3.5 - 5.1 mEq/L   Chloride 100  96 - 112 mEq/L   BUN 29 (*) 6 - 23 mg/dL   Creatinine, Ser 2.13 (*) 0.50 - 1.10 mg/dL   Glucose, Bld 086 (*) 70 - 99 mg/dL   Calcium, Ion 5.78  4.69 - 1.30 mmol/L   TCO2 30  0 - 100 mmol/L   Hemoglobin 14.6  12.0 - 15.0 g/dL   HCT 62.9  52.8 - 41.3 %   Dg Chest Portable 1 View  06/14/2012  *RADIOLOGY REPORT*  Clinical Data: Shortness of breath  PORTABLE CHEST - 1 VIEW  Comparison: 04/04/2011  Findings: Aortic atherosclerosis.  Cardiomediastinal contours otherwise within normal range.  Mild lung base atelectasis or scarring.  Otherwise, no confluent airspace opacity, pleural effusion, or pneumothorax.  Cervical fusion hardware is partially imaged.  No acute osseous finding.  IMPRESSION: Mild lung base opacities, favor atelectasis or scarring.   Original Report Authenticated By: Jearld Lesch, M.D.       Date: 06/14/2012  Rate: 83  Rhythm: normal sinus rhythm  QRS Axis: normal  Intervals: normal  ST/T Wave abnormalities: nonspecific ST changes   Conduction Disutrbances:none  Narrative Interpretation:   Old EKG Reviewed: unchanged  Albuterol breathing treatments continued. remains symptomatic with wheezes unchanged. D/w Dr Onalee Hua, hospitalist to admit.   MDM  Asthma exacerbation  CXR, labs  Medications provided  MED consult/ admit        Sunnie Nielsen, MD 06/14/12 [phone]

## 2012-06-14 NOTE — H&P (Signed)
PCP:   Enrique Sack, MD   Chief Complaint:  sob  HPI: 79 yo female h/o asthma, htn, comes in with several days of cough, subj fevers, worsening sob and wheezing.  Eating ok.  No n/v/d.  No le edema or swelling.  Xsmoker.  Has had several bouts of bronchitis this past winter.  This time not better with home albuterol.  Takes advair.    Review of Systems:  Positive and negative as per HPI otherwise all other systems are negative  Past Medical History: Past Medical History  Diagnosis Date  . Asthma   . Shortness of breath   . Arthritis   . Anxiety   . Hypertension   . Atrial fibrillation     a. 04/04/11  . Osteoarthritis     a. 03/28/11 - R Total Hip Arthroplasty  . Cataracts, both eyes   . Pneumonia   . Bronchitis   . Pleurisy   . Ankle swelling   . Kidney infection   . Frequent urination   . Excessive urination at night   . Chronic headaches   . Weight gain    Past Surgical History  Procedure Laterality Date  . Cardiac catheterization      1980's or 90's - reportedly normal  . Abdominal hysterectomy  1962  . Appendectomy  1954  . Cholecystectomy    . Back surgery  1990  . Cataract extraction bilateral w/ anterior vitrectomy  2007/2008  . Total hip arthroplasty  03/28/2011    Procedure: TOTAL HIP ARTHROPLASTY;  Surgeon: Thera Flake., MD;  Location: [facility] OR;  Service: Orthopedics;  Laterality: Right;  . Cardioversion  04/05/2011    Procedure: CARDIOVERSION;  Surgeon: Gardiner Rhyme, MD;  Location: [facility] OR;  Service: Cardiovascular;  Laterality: N/A;  . Tonsillectomy  1943  . Adenoidectomy  1949  . Foot surgery  1947  . Thoracic disc surgery  2008  . Lumbar disc surgery  2010  . Knee surgery       both knees  . Shoulder surgery      left  . Total hip arthroplasty  03/28/2011    right    Medications: Prior to Admission medications   Medication Sig Start Date End Date Taking? Authorizing Provider  acetaminophen (TYLENOL) 325 MG tablet Take 650 mg by mouth 2  (two) times daily. For temp above 102   Yes Historical Provider, MD  amLODipine (NORVASC) 10 MG tablet Take 10 mg by mouth daily.   Yes Historical Provider, MD  calcium carbonate (OS-CAL) 600 MG TABS Take 600 mg by mouth 2 (two) times daily with a meal.   Yes Historical Provider, MD  chlorzoxazone (PARAFON) 500 MG tablet Take 250 mg by mouth 3 (three) times daily as needed for muscle spasms.   Yes Historical Provider, MD  Fluticasone-Salmeterol (ADVAIR) 250-50 MCG/DOSE AEPB Inhale 1 puff into the lungs every 12 (twelve) hours as needed (for shortness of breath).    Yes Historical Provider, MD  Glucosamine-Chondroit-Vit C-Mn (GLUCOSAMINE CHONDR 1500 COMPLX PO) Take 1 tablet by mouth daily.   Yes Historical Provider, MD  hydrochlorothiazide (HYDRODIURIL) 25 MG tablet Take 25 mg by mouth daily.   Yes Historical Provider, MD  lisinopril (PRINIVIL,ZESTRIL) 40 MG tablet Take 40 mg by mouth daily.   Yes Historical Provider, MD  metoprolol succinate (TOPROL-XL) 100 MG 24 hr tablet Take 100 mg by mouth daily. Take with or immediately following a meal.   Yes Historical Provider, MD  Multiple Vitamin (  MULITIVITAMIN WITH MINERALS) TABS Take 1 tablet by mouth daily.   Yes Historical Provider, MD  Multiple Vitamins-Minerals (ICAPS PO) Take 1 tablet by mouth 2 (two) times daily.   Yes Historical Provider, MD  omeprazole (PRILOSEC) 20 MG capsule Take 20 mg by mouth daily.   Yes Historical Provider, MD  predniSONE (DELTASONE) 10 MG tablet Take 10 mg by mouth 2 (two) times daily.   Yes Historical Provider, MD    Allergies:   Allergies  Allergen Reactions  . Aspirin Nausea And Vomiting  . Aspirin Buf(Alhyd-Mghyd-Cacar) Nausea And Vomiting  . Butazolidin (Phenylbutazone) Other (See Comments)    unknown  . Celebrex (Celecoxib) Other (See Comments)    unknown  . Codeine Nausea And Vomiting  . Doripenem Other (See Comments)    unknown  . Methocarbamol Hives  . Temazepam Other (See Comments)    unknown  .  Vicodin (Hydrocodone-Acetaminophen)     Interacts with bp medication and drops blood pressure    Social History:  reports that she quit smoking about 42 years ago. Her smoking use included Cigarettes. She smoked 0.00 packs per day for 21 years. She has never used smokeless tobacco. She reports that she does not drink alcohol or use illicit drugs.  Family History: Family History  Problem Relation Age of Onset  . Diabetes Father     Father, Mother, 4 sisters (2 living)  . Heart attack Father     (Deceased)  . Heart failure Father     (deceased 63)  . Stroke Mother     (deceased 66)  . Cancer Sister     (deceased)  . Hypertension Father   . Hypertension Mother   . Heart attack Sister   . Diabetes Sister     Physical Exam: Filed Vitals:   06/14/12 0209 06/14/12 0226 06/14/12 0238  BP: 157/64 172/70   Pulse: 84 81   Temp: 98.4 F (36.9 C)    TempSrc: Oral    Resp: 34 25   SpO2: 99% 100% 99%   General appearance: alert, cooperative and no distress Head: Normocephalic, without obvious abnormality, atraumatic Eyes: negative Nose: Nares normal. Septum midline. Mucosa normal. No drainage or sinus tenderness. Neck: no JVD and supple, symmetrical, trachea midline Lungs: wheezes bilaterally Heart: regular rate and rhythm, S1, S2 normal, no murmur, click, rub or gallop Abdomen: soft, non-tender; bowel sounds normal; no masses,  no organomegaly Extremities: extremities normal, atraumatic, no cyanosis or edema Pulses: 2+ and symmetric Skin: Skin color, texture, turgor normal. No rashes or lesions Neurologic: Grossly normal    Labs on Admission:   Recent Labs  06/14/12 0224 06/14/12 0244  NA 139 140  K 3.8 3.9  CL 98 100  CO2 31  --   GLUCOSE 131* 134*  BUN 27* 29*  CREATININE 1.12* 1.20*  CALCIUM 9.8  --     Recent Labs  06/14/12 0224  AST 19  ALT 19  ALKPHOS 93  BILITOT 0.3  PROT 7.6  ALBUMIN 4.3    Recent Labs  06/14/12 0224 06/14/12 0244  WBC 6.1   --   HGB 13.4 14.6  HCT 40.7 43.0  MCV 91.7  --   PLT 231  --     Radiological Exams on Admission: Dg Chest Portable 1 View  06/14/2012  *RADIOLOGY REPORT*  Clinical Data: Shortness of breath  PORTABLE CHEST - 1 VIEW  Comparison: 04/04/2011  Findings: Aortic atherosclerosis.  Cardiomediastinal contours otherwise within normal range.  Mild lung base atelectasis  or scarring.  Otherwise, no confluent airspace opacity, pleural effusion, or pneumothorax.  Cervical fusion hardware is partially imaged.  No acute osseous finding.  IMPRESSION: Mild lung base opacities, favor atelectasis or scarring.   Original Report Authenticated By: Jearld Lesch, M.D.     Assessment/Plan 79 yo female with asthma/copd exacerbation  Principal Problem:   Acute asthma exacerbation Active Problems:   Hypertension   Atrial fibrillation   Acute respiratory distress  tx with iv steroids, levaquin, freq nebs.  Oxygen supplementation.  Tele.    Arliene Rosenow A 06/14/2012, 4:14 AM

## 2012-06-14 NOTE — ED Notes (Signed)
Pt is presenting w/ SOB. Expiratory wheezing heard on RN assessment. RN placed pt upright in bed, pt is currently receiving 2nd breathing tx. Pt's breathing is labored, chest expansion is symmetrical. Pt's color is appropriate. Pt has good peripheral perfusion and pulses. Oxygen sat is 100% while receiving breathing tx.

## 2012-06-14 NOTE — Progress Notes (Signed)
Utilization review completed.  

## 2012-06-14 NOTE — Progress Notes (Signed)
Patient seen and examined. Still sounds terrible; diffuse rhonchi and exp wheezing. Also feeling SOB. Please referrd to H&P per Dr. Onalee Hua for details/info on admission  Plan: -continue nebs, antibiotics, solumedrol and mucinex -start pulmicort -continue PPI -oxygen supplementation -depending response/evolution will decide regarding involving pulmonary service on the case.  Jordan Byrd [phone]

## 2012-06-14 NOTE — ED Notes (Signed)
Respiratory therapy paged

## 2012-06-14 NOTE — ED Notes (Signed)
Radiology at bedside for PCXR

## 2012-06-14 NOTE — Progress Notes (Signed)
ANTIBIOTIC CONSULT NOTE - INITIAL  Pharmacy Consult for levaquin Indication: rule out pneumonia  Allergies  Allergen Reactions  . Aspirin Nausea And Vomiting  . Aspirin Buf(Alhyd-Mghyd-Cacar) Nausea And Vomiting  . Butazolidin (Phenylbutazone) Other (See Comments)    unknown  . Celebrex (Celecoxib) Other (See Comments)    unknown  . Codeine Nausea And Vomiting  . Doripenem Other (See Comments)    unknown  . Methocarbamol Hives  . Temazepam Other (See Comments)    unknown  . Vicodin (Hydrocodone-Acetaminophen)     Interacts with bp medication and drops blood pressure    Patient Measurements: Height: 5\' 6"  (167.6 cm) Weight: 246 lb 1.6 oz (111.63 kg) IBW/kg (Calculated) : 59.3  Vital Signs: Temp: 98.3 F (36.8 C) (05/12 0521) Temp src: Oral (05/12 0209) BP: 171/76 mmHg (05/12 0521) Pulse Rate: 82 (05/12 0521) Intake/Output from previous day:   Intake/Output from this shift:    Labs:  Recent Labs  06/14/12 0224 06/14/12 0244  WBC 6.1  --   HGB 13.4 14.6  PLT 231  --   CREATININE 1.12* 1.20*   Estimated Creatinine Clearance: 50.5 ml/min (by C-G formula based on Cr of 1.2). No results found for this basename: VANCOTROUGH, VANCOPEAK, VANCORANDOM, GENTTROUGH, GENTPEAK, GENTRANDOM, TOBRATROUGH, TOBRAPEAK, TOBRARND, AMIKACINPEAK, AMIKACINTROU, AMIKACIN,  in the last 72 hours   Microbiology: No results found for this or any previous visit (from the past 720 hour(s)).  Medical History: Past Medical History  Diagnosis Date  . Asthma   . Shortness of breath   . Arthritis   . Anxiety   . Hypertension   . Atrial fibrillation     a. 04/04/11  . Osteoarthritis     a. 03/28/11 - R Total Hip Arthroplasty  . Cataracts, both eyes   . Pneumonia   . Bronchitis   . Pleurisy   . Ankle swelling   . Kidney infection   . Frequent urination   . Excessive urination at night   . Chronic headaches   . Weight gain    Assessment: 66 YOF with hx of asthma presented with  acute respiratory distress. Pharmacy is consulted to start levaquin empirically to coverage pneumonia. Afebrile, wbc wnl. Scr 1.2, est. crcl ~ 50 ml/min  Goal of Therapy:  Eradication of infection  Plan:  - Continue levaquin 750 mg IV q 24 hrs as ordered - f/u renal function and clinical course, change IV levaquin to PO if clinically improved  Bayard Hugger, PharmD, BCPS  Clinical Pharmacist  Pager: [phone]   06/14/2012,5:34 AM

## 2012-06-14 NOTE — ED Notes (Signed)
Per EMS, pt became SOB approx 2330 tonight. Pt took 2 puufs of her albuterol inhaler with no relief. Pt called EMS. Upon EMS arrival fire rescue had already started a breathing tx. EMS gave another breathing tx enroute to facility. Pt has had 10mg  Albuterol, 0.5mg  of Atrovent and 125mg  of Solu-Medrol. Pt has hx of asthma, and currently has expiratory wheezing. Pt is a/o x4 and VSS. 100% with treatment going.

## 2012-06-15 MED ORDER — BENZONATATE 100 MG PO CAPS
100.0000 mg | ORAL_CAPSULE | Freq: Three times a day (TID) | ORAL | Status: DC | PRN
  Administered 2012-06-15 – 2012-06-17 (×3): 100 mg via ORAL
  Filled 2012-06-15 (×3): qty 1

## 2012-06-15 MED ORDER — LEVOFLOXACIN 750 MG PO TABS
750.0000 mg | ORAL_TABLET | Freq: Every day | ORAL | Status: DC
  Administered 2012-06-16: 750 mg via ORAL
  Filled 2012-06-15: qty 1

## 2012-06-15 MED ORDER — ISOSORB DINITRATE-HYDRALAZINE 20-37.5 MG PO TABS
1.0000 | ORAL_TABLET | Freq: Two times a day (BID) | ORAL | Status: DC
  Administered 2012-06-15 – 2012-06-19 (×9): 1 via ORAL
  Filled 2012-06-15 (×10): qty 1

## 2012-06-15 MED ORDER — METHYLPREDNISOLONE SODIUM SUCC 125 MG IJ SOLR
60.0000 mg | Freq: Three times a day (TID) | INTRAMUSCULAR | Status: DC
  Administered 2012-06-15 – 2012-06-17 (×6): 60 mg via INTRAVENOUS
  Administered 2012-06-17: 16:00:00 via INTRAVENOUS
  Administered 2012-06-17 – 2012-06-19 (×5): 60 mg via INTRAVENOUS
  Filled 2012-06-15 (×12): qty 0.96
  Filled 2012-06-15: qty 2
  Filled 2012-06-15: qty 0.96
  Filled 2012-06-15: qty 2
  Filled 2012-06-15 (×2): qty 0.96

## 2012-06-15 NOTE — Progress Notes (Signed)
PHARMACIST - PHYSICIAN COMMUNICATION DR:   [facility] CONCERNING: Antibiotic IV to Oral Route Change Policy  RECOMMENDATION: This patient is receiving levaquin by the intravenous route.  Based on criteria approved by the Pharmacy and Therapeutics Committee, the antibiotic(s) is/are being converted to the equivalent oral dose form(s).   DESCRIPTION: These criteria include:  Patient being treated for a respiratory tract infection, urinary tract infection, or cellulitis  The patient is not neutropenic and does not exhibit a GI malabsorption state  The patient is eating (either orally or via tube) and/or has been taking other orally administered medications for a least 24 hours  The patient is improving clinically and has a Tmax < 100.5  If you have questions about this conversion, please contact the Pharmacy Department  []  ( [phone] )  [place] [x]  ( [phone] )  [place]  []  ( [phone] )  [facility] []  ( [phone] )  [facility]     

## 2012-06-15 NOTE — Progress Notes (Signed)
[facility] PROGRESS NOTE  LATESA FRATTO XBJ:[id number] DOB: Dec 28, 1933 DOA: 06/14/2012 PCP: Enrique Sack, MD  Assessment/Plan: 1. [place]/COPD exacerbation: with presumed bronchitis/bronchiectasis. -will continue solumedrol, as patient is still with significant wheezing. -continue abx;s -continue oxygen supplementation -continue dual nebs treatemnt -will discontinue lisinopril, as might be drying airways and make her to cough even more -B-blocker changed to more selective b-blocker -will follow response -mucinex and tessalon pearls for cough  2-Presumed UTI: is on levaquin. Will follow UA and cx results  3-HTN: fair control. Medication adjusted due to increase risk of contributing with breathing problems. Now on amlodipine, bystolic, HCTZ and bidil. Will monitor VS  4-GERD: continue PPI  5-CKD stage 2: at baseline. Will monitor Cr trend  DVT: lovenox   Code Status: Full Family Communication: no family at bedside Disposition Plan: home when medically stable   Consultants:  none  Procedures:  See below for x-ray reports  Antibiotics:  Levaquin  HPI/Subjective: Afebrile; feeling slightly better, but still with significant wheezing and rhonchi.  Objective: Filed Vitals:   06/15/12 0031 06/15/12 0359 06/15/12 0623 06/15/12 0737  BP:   158/72   Pulse:   73   Temp:   97.6 F (36.4 C)   TempSrc:   Oral   Resp:   22   Height:      Weight:   111.585 kg (246 lb)   SpO2: 98% 97% 99% 98%    Intake/Output Summary (Last 24 hours) at 06/15/12 0955 Last data filed at 06/15/12 0813  Gross per 24 hour  Intake    360 ml  Output    400 ml  Net    -40 ml   Filed Weights   06/14/12 0521 06/15/12 0623  Weight: 111.63 kg (246 lb 1.6 oz) 111.585 kg (246 lb)    Exam:   General:  AAOX3, reports is breathing better, no CP  Cardiovascular: S1 and s2, no rubs, no murmurs, no gallops  Respiratory: still with exp wheezing and diffuse rhonchi; no  crackles  Abdomen: soft, NT, ND, positive BS  Musculoskeletal: no joint swelling or erythema; trace on her LE's bilaterally  Data Reviewed: Basic Metabolic Panel:  Recent Labs Lab 06/14/12 0224 06/14/12 0244  NA 139 140  K 3.8 3.9  CL 98 100  CO2 31  --   GLUCOSE 131* 134*  BUN 27* 29*  CREATININE 1.12* 1.20*  CALCIUM 9.8  --    Liver Function Tests:  Recent Labs Lab 06/14/12 0224  AST 19  ALT 19  ALKPHOS 93  BILITOT 0.3  PROT 7.6  ALBUMIN 4.3   CBC:  Recent Labs Lab 06/14/12 0224 06/14/12 0244  WBC 6.1  --   HGB 13.4 14.6  HCT 40.7 43.0  MCV 91.7  --   PLT 231  --          Studies: Dg Chest Portable 1 View  06/14/2012  *RADIOLOGY REPORT*  Clinical Data: Shortness of breath  PORTABLE CHEST - 1 VIEW  Comparison: 04/04/2011  Findings: Aortic atherosclerosis.  Cardiomediastinal contours otherwise within normal range.  Mild lung base atelectasis or scarring.  Otherwise, no confluent airspace opacity, pleural effusion, or pneumothorax.  Cervical fusion hardware is partially imaged.  No acute osseous finding.  IMPRESSION: Mild lung base opacities, favor atelectasis or scarring.   Original Report Authenticated By: Jearld Lesch, M.D.     Scheduled Meds: . albuterol  2.5 mg Nebulization Q4H  . amLODipine  10 mg Oral Daily  . budesonide (  PULMICORT) nebulizer solution  0.25 mg Nebulization BID  . enoxaparin (LOVENOX) injection  40 mg Subcutaneous Q24H  . guaiFENesin  600 mg Oral BID  . hydrochlorothiazide  25 mg Oral Daily  . ipratropium  0.5 mg Nebulization QID  . isosorbide-hydrALAZINE  1 tablet Oral BID  . levofloxacin (LEVAQUIN) IV  750 mg Intravenous Q24H  . methylPREDNISolone (SOLU-MEDROL) injection  60 mg Intravenous Q8H  . multivitamin-lutein  1 capsule Oral BID  . nebivolol  10 mg Oral Daily  . pantoprazole  40 mg Oral BID  . sodium chloride  3 mL Intravenous Q12H  . sodium chloride  3 mL Intravenous Q12H   Continuous Infusions:    Principal Problem:   Acute asthma exacerbation Active Problems:   Hypertension   Atrial fibrillation   Acute respiratory distress    Time spent: >30 minutes    Gerell Fortson  [facility] Pager [phone]. If 7PM-7AM, please contact night-coverage at [email or web address], password [facility] 06/15/2012, 9:55 AM  LOS: 1 day

## 2012-06-16 DIAGNOSIS — M129 Arthropathy, unspecified: Secondary | ICD-10-CM

## 2012-06-16 LAB — CBC
HCT: 35.2 % — ABNORMAL LOW (ref 36.0–46.0)
Hemoglobin: 11.7 g/dL — ABNORMAL LOW (ref 12.0–15.0)
MCV: 89.6 fL (ref 78.0–100.0)
RBC: 3.93 MIL/uL (ref 3.87–5.11)
WBC: 8.5 10*3/uL (ref 4.0–10.5)

## 2012-06-16 LAB — BASIC METABOLIC PANEL
BUN: 58 mg/dL — ABNORMAL HIGH (ref 6–23)
CO2: 28 mEq/L (ref 19–32)
Chloride: 95 mEq/L — ABNORMAL LOW (ref 96–112)
Creatinine, Ser: 1.77 mg/dL — ABNORMAL HIGH (ref 0.50–1.10)
GFR calc Af Amer: 31 mL/min — ABNORMAL LOW (ref >90)
Potassium: 4.1 mEq/L (ref 3.5–5.1)

## 2012-06-16 MED ORDER — LEVOFLOXACIN 750 MG PO TABS
750.0000 mg | ORAL_TABLET | ORAL | Status: DC
  Administered 2012-06-18: 750 mg via ORAL
  Filled 2012-06-16: qty 1

## 2012-06-16 MED ORDER — ALBUTEROL SULFATE (5 MG/ML) 0.5% IN NEBU: 2.5000 mg | INHALATION_SOLUTION | RESPIRATORY_TRACT | Status: DC | PRN

## 2012-06-16 NOTE — Progress Notes (Signed)
SATURATION QUALIFICATIONS: (This note is used to comply with regulatory documentation for home oxygen)  Patient Saturations on Room Air at Rest = 92%  Patient Saturations on Room Air while Ambulating = 87%  Patient Saturations on 2 Liters of oxygen while Ambulating = 94%  Please briefly explain why patient needs home oxygen: 

## 2012-06-16 NOTE — Progress Notes (Signed)
ANTIBIOTIC CONSULT NOTE - INITIAL  Pharmacy Consult for levaquin Indication: rule out pneumonia  Allergies  Allergen Reactions  . Aspirin Nausea And Vomiting  . Aspirin Buf(Alhyd-Mghyd-Cacar) Nausea And Vomiting  . Butazolidin (Phenylbutazone) Other (See Comments)    unknown  . Celebrex (Celecoxib) Other (See Comments)    unknown  . Codeine Nausea And Vomiting  . Doripenem Other (See Comments)    unknown  . Methocarbamol Hives  . Temazepam Other (See Comments)    unknown  . Vicodin (Hydrocodone-Acetaminophen)     Interacts with bp medication and drops blood pressure    Patient Measurements: Height: 5\' 6"  (167.6 cm) Weight: 246 lb (111.585 kg) IBW/kg (Calculated) : 59.3  Vital Signs: BP: 169/79 mmHg (05/14 0932) Intake/Output from previous day: 05/13 0701 - 05/14 0700 In: 3 [I.V.:3] Out: 400 [Urine:400] Intake/Output from this shift: Total I/O In: 240 [P.O.:240] Out: -   Labs:  Recent Labs  06/14/12 0224 06/14/12 0244 06/16/12 0415  WBC 6.1  --  8.5  HGB 13.4 14.6 11.7*  PLT 231  --  245  CREATININE 1.12* 1.20* 1.77*   Estimated Creatinine Clearance: 34.2 ml/min (by C-G formula based on Cr of 1.77). No results found for this basename: VANCOTROUGH, VANCOPEAK, VANCORANDOM, GENTTROUGH, GENTPEAK, GENTRANDOM, TOBRATROUGH, TOBRAPEAK, TOBRARND, AMIKACINPEAK, AMIKACINTROU, AMIKACIN,  in the last 72 hours   Microbiology: No results found for this or any previous visit (from the past 720 hour(s)).  Medical History: Past Medical History  Diagnosis Date  . Asthma   . Shortness of breath   . Arthritis   . Anxiety   . Hypertension   . Atrial fibrillation     a. 04/04/11  . Osteoarthritis     a. 03/28/11 - R Total Hip Arthroplasty  . Cataracts, both eyes   . Pneumonia   . Bronchitis   . Pleurisy   . Ankle swelling   . Kidney infection   . Frequent urination   . Excessive urination at night   . Chronic headaches   . Weight gain    Assessment: 30 YOF with  hx of asthma presented with acute respiratory distress. On levaquin for UTI but UA is neg. Renal function is getting worse.   Plan:   Decrease levaquin to 750mg  q48

## 2012-06-16 NOTE — Progress Notes (Signed)
Patient ID: ALYRICA THUROW  female  ZOX:[id number]    DOB: 23-Jun-1933    DOA: 06/14/2012  PCP: Enrique Sack, MD  Assessment/Plan: Principal Problem:   Acute asthma exacerbation with acute hypoxic respiratory failure: Still wheezing. Chest x-ray did not show any pneumonia - Currently O2 sats 96% on 3 L, does not need oxygen at home, will need home O2 evaluation prior to DC -Continue albuterol nebs q4hours, Pulmicort,  levofloxacin, IV steroids  - Continue PPI BID  Active Problems:   Hypertension:  - Continue Norvasc, beta blocker,HCTZ , BIdil  Presumed UTI: Currently on Levaquin, urine culture pending  CK  stage II: Currently at baseline  DVT Prophylaxis:Lovenox  Code Status:Full CODE STATUS  Disposition:Not medically ready    Subjective: Still wheezing bilaterally, short of breath  Objective: Weight change:   Intake/Output Summary (Last 24 hours) at 06/16/12 1116 Last data filed at 06/16/12 0900  Gross per 24 hour  Intake    243 ml  Output      0 ml  Net    243 ml   Blood pressure 169/79, pulse 77, temperature 97.5 F (36.4 C), temperature source Oral, resp. rate 20, height 5\' 6"  (1.676 m), weight 111.585 kg (246 lb), SpO2 96.00%.  Physical Exam: General: Alert and awake, oriented x3, not in any acute distress. CVS: S1-S2 clear, no murmur rubs or gallops Chest: Bilateral expiratory wheezing Abdomen: soft nontender, nondistended, normal bowel sounds Extremities: no cyanosis, clubbing or edema noted bilaterally   Lab Results: Basic Metabolic Panel:  Recent Labs Lab 06/14/12 0224 06/14/12 0244 06/16/12 0415  NA 139 140 134*  K 3.8 3.9 4.1  CL 98 100 95*  CO2 31  --  28  GLUCOSE 131* 134* 139*  BUN 27* 29* 58*  CREATININE 1.12* 1.20* 1.77*  CALCIUM 9.8  --  8.7   Liver Function Tests:  Recent Labs Lab 06/14/12 0224  AST 19  ALT 19  ALKPHOS 93  BILITOT 0.3  PROT 7.6  ALBUMIN 4.3   No results found for this basename: LIPASE, AMYLASE,  in the  last 168 hours No results found for this basename: AMMONIA,  in the last 168 hours CBC:  Recent Labs Lab 06/14/12 0224 06/14/12 0244 06/16/12 0415  WBC 6.1  --  8.5  HGB 13.4 14.6 11.7*  HCT 40.7 43.0 35.2*  MCV 91.7  --  89.6  PLT 231  --  245    Studies/Results: Dg Chest Portable 1 View  06/14/2012   *RADIOLOGY REPORT*  Clinical Data: Shortness of breath  PORTABLE CHEST - 1 VIEW  Comparison: 04/04/2011  Findings: Aortic atherosclerosis.  Cardiomediastinal contours otherwise within normal range.  Mild lung base atelectasis or scarring.  Otherwise, no confluent airspace opacity, pleural effusion, or pneumothorax.  Cervical fusion hardware is partially imaged.  No acute osseous finding.  IMPRESSION: Mild lung base opacities, favor atelectasis or scarring.   Original Report Authenticated By: Jearld Lesch, M.D.    Medications: Scheduled Meds: . albuterol  2.5 mg Nebulization Q4H  . amLODipine  10 mg Oral Daily  . budesonide (PULMICORT) nebulizer solution  0.25 mg Nebulization BID  . enoxaparin (LOVENOX) injection  40 mg Subcutaneous Q24H  . guaiFENesin  600 mg Oral BID  . hydrochlorothiazide  25 mg Oral Daily  . ipratropium  0.5 mg Nebulization QID  . isosorbide-hydrALAZINE  1 tablet Oral BID  . levofloxacin  750 mg Oral Daily  . methylPREDNISolone (SOLU-MEDROL) injection  60 mg Intravenous Q8H  .  multivitamin-lutein  1 capsule Oral BID  . nebivolol  10 mg Oral Daily  . pantoprazole  40 mg Oral BID  . sodium chloride  3 mL Intravenous Q12H  . sodium chloride  3 mL Intravenous Q12H      LOS: 2 days   RAI,RIPUDEEP M.D. [facility] 06/16/2012, 11:16 AM Pager: [phone]  If 7PM-7AM, please contact night-coverage [email or web address] Password [email or web address]

## 2012-06-17 DIAGNOSIS — N179 Acute kidney failure, unspecified: Secondary | ICD-10-CM | POA: Diagnosis present

## 2012-06-17 MED ORDER — SODIUM CHLORIDE 0.9 % IV SOLN
250.0000 mL | INTRAVENOUS | Status: DC | PRN
  Administered 2012-06-17: 11:00:00 via INTRAVENOUS

## 2012-06-17 MED ORDER — ACETAMINOPHEN 325 MG PO TABS
650.0000 mg | ORAL_TABLET | Freq: Four times a day (QID) | ORAL | Status: DC | PRN
  Administered 2012-06-17 – 2012-06-18 (×2): 650 mg via ORAL
  Filled 2012-06-17 (×2): qty 2

## 2012-06-17 MED ORDER — DOCUSATE SODIUM 50 MG PO CAPS
50.0000 mg | ORAL_CAPSULE | Freq: Two times a day (BID) | ORAL | Status: DC
  Administered 2012-06-17 – 2012-06-19 (×4): 50 mg via ORAL
  Filled 2012-06-17 (×5): qty 1

## 2012-06-17 NOTE — Progress Notes (Signed)
[facility] PROGRESS NOTE Assessment/Plan:  AKI (acute kidney injury) - dc/d HCTZ start iv VI Fluids, check a b-met in am - most likely due to insensible losses and diuretics.   Acute respiratory distress/Acute asthma exacerbation: - Desaturating with ambulation will need home O2. - Cont steroids at current dose, RT for Pulm toileting.   Paroxsymal Atrial fibrillation: - Rate controlled. - now SR no anticoagulation.   Hypertension   Code Status: Full Family Communication: none  Disposition Plan: home in am   Consultants:  none  Procedures:  none  Antibiotics:  Levaquin  HPI/Subjective: She relates her SOB is improved.  Objective: Filed Vitals:   06/16/12 1733 06/16/12 2146 06/17/12 0523 06/17/12 0759  BP:  134/66 158/66   Pulse:  85 87   Temp:  97.7 F (36.5 C) 97.7 F (36.5 C)   TempSrc:  Oral Oral   Resp:  20 20   Height:      Weight:      SpO2: 95% 96% 95% 96%    Intake/Output Summary (Last 24 hours) at 06/17/12 0944 Last data filed at 06/16/12 1356  Gross per 24 hour  Intake    240 ml  Output      0 ml  Net    240 ml   Filed Weights   06/14/12 0521 06/15/12 0623  Weight: 111.63 kg (246 lb 1.6 oz) 111.585 kg (246 lb)    Exam:  General: Alert, awake, oriented x3, in no acute distress.  HEENT: No bruits, no goiter.  Heart: Regular rate and rhythm, without murmurs, rubs, gallops.  Lungs: Moderate air movement, wheezing and Ronchi bilateral. Abdomen: Soft, nontender, nondistended, positive bowel sounds.  Neuro: Grossly intact, nonfocal.   Data Reviewed: Basic Metabolic Panel:  Recent Labs Lab 06/14/12 0224 06/14/12 0244 06/16/12 0415  NA 139 140 134*  K 3.8 3.9 4.1  CL 98 100 95*  CO2 31  --  28  GLUCOSE 131* 134* 139*  BUN 27* 29* 58*  CREATININE 1.12* 1.20* 1.77*  CALCIUM 9.8  --  8.7   Liver Function Tests:  Recent Labs Lab 06/14/12 0224  AST 19  ALT 19  ALKPHOS 93  BILITOT 0.3  PROT 7.6  ALBUMIN 4.3   No  results found for this basename: LIPASE, AMYLASE,  in the last 168 hours No results found for this basename: AMMONIA,  in the last 168 hours CBC:  Recent Labs Lab 06/14/12 0224 06/14/12 0244 06/16/12 0415  WBC 6.1  --  8.5  HGB 13.4 14.6 11.7*  HCT 40.7 43.0 35.2*  MCV 91.7  --  89.6  PLT 231  --  245   Cardiac Enzymes: No results found for this basename: CKTOTAL, CKMB, CKMBINDEX, TROPONINI,  in the last 168 hours BNP (last 3 results) No results found for this basename: PROBNP,  in the last 8760 hours CBG: No results found for this basename: GLUCAP,  in the last 168 hours  No results found for this or any previous visit (from the past 240 hour(s)).   Studies: No results found.  Scheduled Meds: . albuterol  2.5 mg Nebulization Q4H  . amLODipine  10 mg Oral Daily  . budesonide (PULMICORT) nebulizer solution  0.25 mg Nebulization BID  . enoxaparin (LOVENOX) injection  40 mg Subcutaneous Q24H  . guaiFENesin  600 mg Oral BID  . ipratropium  0.5 mg Nebulization QID  . isosorbide-hydrALAZINE  1 tablet Oral BID  . [START ON 06/18/2012] levofloxacin  750 mg Oral  Q48H  . methylPREDNISolone (SOLU-MEDROL) injection  60 mg Intravenous Q8H  . multivitamin-lutein  1 capsule Oral BID  . nebivolol  10 mg Oral Daily  . pantoprazole  40 mg Oral BID  . sodium chloride  3 mL Intravenous Q12H  . sodium chloride  3 mL Intravenous Q12H   Continuous Infusions:    Marinda Elk  [facility] Pager [phone]. If 8PM-8AM, please contact night-coverage at [email or web address], password [facility] 06/17/2012, 9:44 AM  LOS: 3 days

## 2012-06-17 NOTE — Evaluation (Signed)
Physical Therapy Evaluation Patient Details Name: Jordan Byrd MRN: [id number] DOB: Feb 17, 1933 Today's Date: 06/17/2012 Time: 4098-1191 PT Time Calculation (min): 28 min  PT Assessment / Plan / Recommendation Clinical Impression  79 yo female admitted with acute asthma exacerbation. On eval, pt required Min guard assist to ambulate ~50 feet x 2 with RW. Demonstrates decreased activity tolerance. O2 sat readings: 95% on 2.5L O2 at rest, 91% on 3L O2 during ambulation, recovered to 96% on 2.5L O2by end of session. Recommend HHPT at discharge.     PT Assessment  Patient needs continued PT services    Follow Up Recommendations  Home health PT    Does the patient have the potential to tolerate intense rehabilitation      Barriers to Discharge        Equipment Recommendations  None recommended by PT    Recommendations for Other Services OT consult   Frequency Min 3X/week    Precautions / Restrictions Precautions Precautions: None Restrictions Weight Bearing Restrictions: No   Pertinent Vitals/Pain       Mobility  Bed Mobility Bed Mobility: Supine to Sit;Sit to Supine Supine to Sit: 6: Modified independent (Device/Increase time);With rails;HOB elevated Sit to Supine: 6: Modified independent (Device/Increase time);HOB elevated;With rail Transfers Transfers: Sit to Stand;Stand to Sit Sit to Stand: 6: Modified independent (Device/Increase time);From bed Stand to Sit: 6: Modified independent (Device/Increase time);To bed Ambulation/Gait Ambulation/Gait Assistance: 4: Min guard Ambulation Distance (Feet): 50 Feet (x2) Assistive device: Rolling walker Ambulation/Gait Assistance Details: Fatigues/SOB easily. Ambulated on 3L O2. 1 brief standing rest break needed.  Gait Pattern: Step-through pattern    Exercises     PT Diagnosis: Difficulty walking  PT Problem List: Decreased activity tolerance;Decreased mobility PT Treatment Interventions: DME instruction;Gait  training;Functional mobility training;Therapeutic exercise;Therapeutic activities;Patient/family education   PT Goals Acute Rehab PT Goals PT Goal Formulation: With patient Time For Goal Achievement: 06/24/12 Potential to Achieve Goals: Good Pt will Ambulate: 51 - 150 feet;with modified independence;with rolling walker (with O2 sats >90%, dyspnea </=2/4) PT Goal: Ambulate - Progress: Goal set today  Visit Information  Last PT Received On: 06/17/12 Assistance Needed: +1    Subjective Data  Subjective: IMy walking is bad.Marland KitchenMarland KitchenMarland KitchenI get out of breath Patient Stated Goal: home. Breathe better.   Prior Functioning  Home Living Lives With: Alone Type of Home: House Home Access: Ramped entrance Home Layout: One level Home Adaptive Equipment: Walker - rolling;Straight cane;Bedside commode/3-in-1 Prior Function Level of Independence: Independent with assistive device(s) Able to Take Stairs?: No Driving: Yes Communication Communication: No difficulties    Cognition  Cognition Arousal/Alertness: Awake/alert Behavior During Therapy: WFL for tasks assessed/performed Overall Cognitive Status: Within Functional Limits for tasks assessed    Extremity/Trunk Assessment Right Lower Extremity Assessment RLE ROM/Strength/Tone: [facility] for tasks assessed Left Lower Extremity Assessment LLE ROM/Strength/Tone: [facility] for tasks assessed   Balance    End of Session PT - End of Session Equipment Utilized During Treatment: Oxygen Activity Tolerance: Patient limited by fatigue Patient left: in bed;with call bell/phone within reach  GP     Rebeca Alert, MPT Pager: [phone]

## 2012-06-18 ENCOUNTER — Encounter (HOSPITAL_COMMUNITY): Payer: Self-pay | Admitting: Physician Assistant

## 2012-06-18 ENCOUNTER — Other Ambulatory Visit: Payer: Self-pay | Admitting: Physician Assistant

## 2012-06-18 ENCOUNTER — Inpatient Hospital Stay (HOSPITAL_COMMUNITY): Payer: Medicare Other

## 2012-06-18 LAB — BASIC METABOLIC PANEL
BUN: 53 mg/dL — ABNORMAL HIGH (ref 6–23)
CO2: 22 mEq/L (ref 19–32)
Calcium: 8.6 mg/dL (ref 8.4–10.5)
Chloride: 92 mEq/L — ABNORMAL LOW (ref 96–112)
Creatinine, Ser: 1.4 mg/dL — ABNORMAL HIGH (ref 0.50–1.10)
Glucose, Bld: 164 mg/dL — ABNORMAL HIGH (ref 70–99)

## 2012-06-18 MED ORDER — FLECAINIDE ACETATE 100 MG PO TABS
200.0000 mg | ORAL_TABLET | Freq: Once | ORAL | Status: AC
  Administered 2012-06-18: 200 mg via ORAL
  Filled 2012-06-18: qty 2

## 2012-06-18 MED ORDER — METOPROLOL TARTRATE 1 MG/ML IV SOLN: 5.0000 mg | Freq: Four times a day (QID) | INTRAVENOUS | Status: DC | PRN

## 2012-06-18 MED ORDER — OCUVITE-LUTEIN PO CAPS
1.0000 | ORAL_CAPSULE | Freq: Two times a day (BID) | ORAL | Status: DC
  Administered 2012-06-18 – 2012-06-19 (×2): 1 via ORAL
  Filled 2012-06-18 (×3): qty 1

## 2012-06-18 MED ORDER — METOPROLOL TARTRATE 1 MG/ML IV SOLN
5.0000 mg | Freq: Four times a day (QID) | INTRAVENOUS | Status: DC | PRN
  Administered 2012-06-18: 5 mg via INTRAVENOUS
  Filled 2012-06-18: qty 5

## 2012-06-18 MED ORDER — FLECAINIDE ACETATE 50 MG PO TABS
50.0000 mg | ORAL_TABLET | Freq: Two times a day (BID) | ORAL | Status: DC
  Filled 2012-06-18: qty 1

## 2012-06-18 MED ORDER — SODIUM CHLORIDE 0.9 % IV SOLN
INTRAVENOUS | Status: DC
  Administered 2012-06-18: 75 mL via INTRAVENOUS

## 2012-06-18 MED ORDER — RIVAROXABAN 15 MG PO TABS
15.0000 mg | ORAL_TABLET | Freq: Every day | ORAL | Status: DC
  Administered 2012-06-18 – 2012-06-19 (×2): 15 mg via ORAL
  Filled 2012-06-18 (×2): qty 1

## 2012-06-18 MED ORDER — LEVALBUTEROL HCL 1.25 MG/0.5ML IN NEBU
1.2500 mg | INHALATION_SOLUTION | Freq: Three times a day (TID) | RESPIRATORY_TRACT | Status: DC
  Administered 2012-06-18 – 2012-06-19 (×2): 1.25 mg via RESPIRATORY_TRACT
  Filled 2012-06-18 (×4): qty 0.5

## 2012-06-18 MED ORDER — METOPROLOL TARTRATE 1 MG/ML IV SOLN: 5.0000 mg | INTRAVENOUS | Status: DC | PRN

## 2012-06-18 MED ORDER — LEVALBUTEROL HCL 1.25 MG/0.5ML IN NEBU
1.2500 mg | INHALATION_SOLUTION | RESPIRATORY_TRACT | Status: DC
  Administered 2012-06-18: 1.25 mg via RESPIRATORY_TRACT
  Filled 2012-06-18 (×5): qty 0.5

## 2012-06-18 MED ORDER — IPRATROPIUM BROMIDE 0.02 % IN SOLN
0.5000 mg | Freq: Three times a day (TID) | RESPIRATORY_TRACT | Status: DC
  Administered 2012-06-19: 0.5 mg via RESPIRATORY_TRACT
  Filled 2012-06-18 (×2): qty 2.5

## 2012-06-18 NOTE — Progress Notes (Signed)
SATURATION QUALIFICATIONS: (This note is used to comply with regulatory documentation for home oxygen)  Patient Saturations on Room Air at Rest = 95%  Patient Saturations on Room Air while Ambulating = 85%  Patient Saturations on 3 Liters of oxygen while Ambulating = 95%  Please briefly explain why patient needs home oxygen:Pt desats with ambulation without O2.  Needs O2 when ambulating to keep sats >90%.  Thanks.  [facility] Acute Rehabilitation [phone] (pager)

## 2012-06-18 NOTE — Progress Notes (Signed)
ANTIBIOTIC CONSULT NOTE - INITIAL  Pharmacy Consult for levaquin Indication: rule out pneumonia  Allergies  Allergen Reactions  . Aspirin Nausea And Vomiting  . Aspirin Buf(Alhyd-Mghyd-Cacar) Nausea And Vomiting  . Butazolidin (Phenylbutazone) Other (See Comments)    unknown  . Celebrex (Celecoxib) Other (See Comments)    unknown  . Codeine Nausea And Vomiting  . Doripenem Other (See Comments)    unknown  . Methocarbamol Hives  . Temazepam Other (See Comments)    unknown  . Vicodin (Hydrocodone-Acetaminophen)     Interacts with bp medication and drops blood pressure    Patient Measurements: Height: 5\' 6"  (167.6 cm) Weight: 246 lb (111.585 kg) IBW/kg (Calculated) : 59.3  Vital Signs: Temp: 97.2 F (36.2 C) (05/16 0533) Temp src: Oral (05/16 0533) BP: 144/109 mmHg (05/16 1018) Pulse Rate: 88 (05/16 0533) Intake/Output from previous day: 05/15 0701 - 05/16 0700 In: 820 [P.O.:820] Out: 200 [Urine:200] Intake/Output from this shift: Total I/O In: 243 [P.O.:240; I.V.:3] Out: -   Labs:  Recent Labs  06/16/12 0415 06/18/12 0954  WBC 8.5  --   HGB 11.7*  --   PLT 245  --   CREATININE 1.77* 1.40*   Estimated Creatinine Clearance: 43.3 ml/min (by C-G formula based on Cr of 1.4). No results found for this basename: VANCOTROUGH, VANCOPEAK, VANCORANDOM, GENTTROUGH, GENTPEAK, GENTRANDOM, TOBRATROUGH, TOBRAPEAK, TOBRARND, AMIKACINPEAK, AMIKACINTROU, AMIKACIN,  in the last 72 hours   Microbiology: No results found for this or any previous visit (from the past 720 hour(s)).  Medical History: Past Medical History  Diagnosis Date  . Asthma   . Shortness of breath   . Arthritis   . Anxiety   . Hypertension   . Atrial fibrillation     a. 04/04/11  . Osteoarthritis     a. 03/28/11 - R Total Hip Arthroplasty  . Cataracts, both eyes   . Pneumonia   . Bronchitis   . Pleurisy   . Ankle swelling   . Kidney infection   . Frequent urination   . Excessive urination at  night   . Chronic headaches   . Weight gain    Assessment: 70 YOF with hx of asthma presented with acute respiratory distress. On levaquin for UTI but UA is neg. D5 of abx. D/w Dr. Darin Engels, dc abx today.   Plan:   Dc abx

## 2012-06-18 NOTE — Care Management Note (Unsigned)
    Page 1 of 1   06/18/2012     11:30:22 AM   CARE MANAGEMENT NOTE 06/18/2012  Patient:  Jordan Byrd, Jordan Byrd   Account Number:  [id number]  Date Initiated:  06/18/2012  Documentation initiated by:  GRAVES-BIGELOW,Dontez Hauss  Subjective/Objective Assessment:   Pt admitted witrh asthma exacerbation. Pt lives alone and has a grandson that checks in on her. He lives two houses down from her.     Action/Plan:   Pt refusing HHPT services at this time. She will need 02 for home when medically stable. If pt is d/c on Sat. she will have to requlaify for 02. Will need ambulating sats documented in EPIC.   Anticipated DC Date:  06/19/2012   Anticipated DC Plan:  HOME/SELF CARE      DC Planning Services  CM consult      Choice offered to / List presented to:     DME arranged  [facility]           Status of service:  In process, will continue to follow Medicare Important Message given?   (If response is "NO", the following Medicare IM given date fields will be blank) Date Medicare IM given:   Date Additional Medicare IM given:    Discharge Disposition:  HOME/SELF CARE  Per UR Regulation:  Reviewed for med. necessity/level of care/duration of stay  If discussed at Long Length of Stay Meetings, dates discussed:    Comments:

## 2012-06-18 NOTE — Progress Notes (Signed)
[facility] PROGRESS NOTE Assessment/Plan:  AKI (acute kidney injury) - dc/d HCTZ start iv VI Fluids, b-me tpending. - most likely due to insensible losses and diuretics. - KVO IV fluids. - CXR.   Acute respiratory distress/Acute asthma exacerbation: - Desaturating with ambulation will need home O2. Sat. > 95% 2l repeat O2 with ambulation. - Cont steroids at current dose, RT for Pulm toileting.   Paroxsymal Atrial fibrillation: - irregular tachycardia, afib on telemetry check EKG. - metoprolol low dose.    Hypertension:  - stable monitor.  Code Status: Full Family Communication: none  Disposition Plan: home in am   Consultants:  none  Procedures:  none  Antibiotics:  Levaquin  HPI/Subjective: She relates her SOB is improved. asx  Objective: Filed Vitals:   06/17/12 2032 06/17/12 2305 06/18/12 0533 06/18/12 0908  BP: 144/66  146/73   Pulse: 87  88   Temp: 97.3 F (36.3 C)  97.2 F (36.2 C)   TempSrc: Oral  Oral   Resp: 18  20   Height:      Weight:      SpO2: 93% 95% 95% 97%    Intake/Output Summary (Last 24 hours) at 06/18/12 0944 Last data filed at 06/17/12 1700  Gross per 24 hour  Intake    720 ml  Output    200 ml  Net    520 ml   Filed Weights   06/14/12 0521 06/15/12 0623  Weight: 111.63 kg (246 lb 1.6 oz) 111.585 kg (246 lb)    Exam:  General: Alert, awake, oriented x3, in no acute distress.  HEENT: No bruits, no goiter.  Heart: Regular rate and rhythm, without murmurs, rubs, gallops.  Lungs: Moderate air movement, Ronchi on left. Abdomen: Soft, nontender, nondistended, positive bowel sounds.  Neuro: Grossly intact, nonfocal.   Data Reviewed: Basic Metabolic Panel:  Recent Labs Lab 06/14/12 0224 06/14/12 0244 06/16/12 0415  NA 139 140 134*  K 3.8 3.9 4.1  CL 98 100 95*  CO2 31  --  28  GLUCOSE 131* 134* 139*  BUN 27* 29* 58*  CREATININE 1.12* 1.20* 1.77*  CALCIUM 9.8  --  8.7   Liver Function Tests:  Recent  Labs Lab 06/14/12 0224  AST 19  ALT 19  ALKPHOS 93  BILITOT 0.3  PROT 7.6  ALBUMIN 4.3   No results found for this basename: LIPASE, AMYLASE,  in the last 168 hours No results found for this basename: AMMONIA,  in the last 168 hours CBC:  Recent Labs Lab 06/14/12 0224 06/14/12 0244 06/16/12 0415  WBC 6.1  --  8.5  HGB 13.4 14.6 11.7*  HCT 40.7 43.0 35.2*  MCV 91.7  --  89.6  PLT 231  --  245   Cardiac Enzymes: No results found for this basename: CKTOTAL, CKMB, CKMBINDEX, TROPONINI,  in the last 168 hours BNP (last 3 results) No results found for this basename: PROBNP,  in the last 8760 hours CBG: No results found for this basename: GLUCAP,  in the last 168 hours  No results found for this or any previous visit (from the past 240 hour(s)).   Studies: No results found.  Scheduled Meds: . albuterol  2.5 mg Nebulization Q4H  . amLODipine  10 mg Oral Daily  . budesonide (PULMICORT) nebulizer solution  0.25 mg Nebulization BID  . docusate sodium  50 mg Oral BID  . enoxaparin (LOVENOX) injection  40 mg Subcutaneous Q24H  . guaiFENesin  600 mg Oral BID  .  ipratropium  0.5 mg Nebulization QID  . isosorbide-hydrALAZINE  1 tablet Oral BID  . levofloxacin  750 mg Oral Q48H  . methylPREDNISolone (SOLU-MEDROL) injection  60 mg Intravenous Q8H  . multivitamin-lutein  1 capsule Oral BID  . nebivolol  10 mg Oral Daily  . pantoprazole  40 mg Oral BID  . sodium chloride  3 mL Intravenous Q12H   Continuous Infusions:    Marinda Elk  [facility] Pager [phone]. If 8PM-8AM, please contact night-coverage at [email or web address], password [facility] 06/18/2012, 9:44 AM  LOS: 4 days

## 2012-06-18 NOTE — Progress Notes (Signed)
DCCV scheduled for 9 am, 5/17. Dr Jens Som to perform.

## 2012-06-18 NOTE — Consult Note (Signed)
CARDIOLOGY CONSULT NOTE   Patient ID: ADDELINE CALARCO MRN: [id number] DOB/AGE: [id number] y.o.  Admit date: 06/14/2012  Primary Physician   GREEN, Lorenda Ishihara, MD Primary Cardiologist   JA  Reason for Consultation   Atrial fib  NWG:NFAOZ S Kreischer is a 79 y.o. female with a history of atrial fib, but not CAD. Ms Dafoe had PAF in March 2013, 1 week after d/c from the hospital after hip surgery. She was very symptomatic and it was felt safe to perform DCCV as the duration was < 48 hr. She was placed on flecainide. This was later D/C'd 06/04/2011. Pradaxa was d/c'd 09/2011.  She was admitted 5/12 with asthma/COPD exacerbation. She has been on nebulizers, antibiotics and steroids. She has been on her home dose of Bystolic 10 mg. She was in SR on admission and HR has been WNL according to documented VS. Today, her HR was noted to be irregular and rapid, ECG shows atrial fibrillation RVR. Cardiology to evaluate.   Today, Ms. Bunte was ambulating when she noted a sudden increase in her shortness of breath and had to stop. She is not aware of a rapid or irregular heart rate. She does not routinely have palpitations. The symptoms are similar to the symptoms she had prior to her admission last year for atrial fibrillation. The staff noted an increase in her heart rate and ECG was done which showed atrial fibrillation. She had no chest pain. Her shortness of breath has improved since admission but she is not back to her baseline. She is aware she will need oxygen at discharge. She feels extremely weak but she has been this way since prior to admission. By reviewing telemetry, it is possible to document when she went into atrial fibrillation. Currently, her heart rate is in the 120s. She feels weak and a little shaky but has just had a nebulizer treatment. Prior to admission, she has not had any presyncope or syncope. She has not had any of the sudden shortness of breath and weakness that she had today since her  admission last year.  Past Medical History  Diagnosis Date  . Asthma   . Shortness of breath   . Arthritis   . Anxiety   . Hypertension   . Atrial fibrillation     a. 04/04/11  . Osteoarthritis     a. 03/28/11 - R Total Hip Arthroplasty  . Cataracts, both eyes   . Pneumonia   . Bronchitis   . Pleurisy   . Ankle swelling   . Kidney infection   . Frequent urination   . Excessive urination at night   . Chronic headaches   . Weight gain      Past Surgical History  Procedure Laterality Date  . Cardiac catheterization      1980's or 90's - reportedly normal  . Abdominal hysterectomy  1962  . Appendectomy  1954  . Cholecystectomy    . Back surgery  1990  . Cataract extraction bilateral w/ anterior vitrectomy  2007/2008  . Total hip arthroplasty  03/28/2011    Procedure: TOTAL HIP ARTHROPLASTY;  Surgeon: Thera Flake., MD;  Location: [facility] OR;  Service: Orthopedics;  Laterality: Right;  . Cardioversion  04/05/2011    Procedure: CARDIOVERSION;  Surgeon: Gardiner Rhyme, MD;  Location: [facility] OR;  Service: Cardiovascular;  Laterality: N/A;  . Tonsillectomy  1943  . Adenoidectomy  1949  . Foot surgery  1947  . Thoracic disc surgery  2008  .  Lumbar disc surgery  2010  . Knee surgery       both knees  . Shoulder surgery      left  . Total hip arthroplasty  03/28/2011    right   Allergies  Allergen Reactions  . Aspirin Nausea And Vomiting  . Aspirin Buf(Alhyd-Mghyd-Cacar) Nausea And Vomiting  . Butazolidin (Phenylbutazone) Other (See Comments)    unknown  . Celebrex (Celecoxib) Other (See Comments)    unknown  . Codeine Nausea And Vomiting  . Doripenem Other (See Comments)    unknown  . Methocarbamol Hives  . Temazepam Other (See Comments)    unknown  . Vicodin (Hydrocodone-Acetaminophen)     Interacts with bp medication and drops blood pressure   I have reviewed the patient's current medications . albuterol  2.5 mg Nebulization Q4H  . amLODipine  10 mg Oral Daily  .  budesonide (PULMICORT) nebulizer solution  0.25 mg Nebulization BID  . docusate sodium  50 mg Oral BID  . enoxaparin (LOVENOX) injection  40 mg Subcutaneous Q24H  . guaiFENesin  600 mg Oral BID  . ipratropium  0.5 mg Nebulization QID  . isosorbide-hydrALAZINE  1 tablet Oral BID  . levofloxacin  750 mg Oral Q48H  . methylPREDNISolone (SOLU-MEDROL) injection  60 mg Intravenous Q8H  . multivitamin-lutein  1 capsule Oral BID  . nebivolol  10 mg Oral Daily  . pantoprazole  40 mg Oral BID  . sodium chloride  3 mL Intravenous Q12H     acetaminophen, albuterol, benzonatate, chlorzoxazone, metoprolol, sodium chloride  Prior to Admission medications   Medication Sig Start Date End Date Taking? Authorizing Provider  acetaminophen (TYLENOL) 325 MG tablet Take 650 mg by mouth 2 (two) times daily. For temp above 102   Yes Historical Provider, MD  amLODipine (NORVASC) 10 MG tablet Take 10 mg by mouth daily.   Yes Historical Provider, MD  calcium carbonate (OS-CAL) 600 MG TABS Take 600 mg by mouth 2 (two) times daily with a meal.   Yes Historical Provider, MD  chlorzoxazone (PARAFON) 500 MG tablet Take 250 mg by mouth 3 (three) times daily as needed for muscle spasms.   Yes Historical Provider, MD  Fluticasone-Salmeterol (ADVAIR) 250-50 MCG/DOSE AEPB Inhale 1 puff into the lungs every 12 (twelve) hours as needed (for shortness of breath).    Yes Historical Provider, MD  Glucosamine-Chondroit-Vit C-Mn (GLUCOSAMINE CHONDR 1500 COMPLX PO) Take 1 tablet by mouth daily.   Yes Historical Provider, MD  hydrochlorothiazide (HYDRODIURIL) 25 MG tablet Take 25 mg by mouth daily.   Yes Historical Provider, MD  lisinopril (PRINIVIL,ZESTRIL) 40 MG tablet Take 40 mg by mouth daily.   Yes Historical Provider, MD  metoprolol succinate (TOPROL-XL) 100 MG 24 hr tablet Take 100 mg by mouth daily. Take with or immediately following a meal.   Yes Historical Provider, MD  Multiple Vitamin (MULITIVITAMIN WITH MINERALS) TABS Take  1 tablet by mouth daily.   Yes Historical Provider, MD  Multiple Vitamins-Minerals (ICAPS PO) Take 1 tablet by mouth 2 (two) times daily.   Yes Historical Provider, MD  omeprazole (PRILOSEC) 20 MG capsule Take 20 mg by mouth daily.   Yes Historical Provider, MD  predniSONE (DELTASONE) 10 MG tablet Take 10 mg by mouth 2 (two) times daily.   Yes Historical Provider, MD     History   Social History  . Marital Status: Widowed    Spouse Name: N/A    Number of Children: N/A  . Years of Education:  N/A   Occupational History  . Retired    Social History Main Topics  . Smoking status: Former Smoker -- 21 years    Types: Cigarettes    Quit date: 03/17/1970  . Smokeless tobacco: Never Used  . Alcohol Use: No  . Drug Use: No  . Sexually Active: Not Currently   Other Topics Concern  . Not on file   Social History Narrative   Lives alone in [place].  Widowed in 2012 (husband w/ dementia & parkinsons)..   Not active, does not drive due to vision problems.    Family History  Problem Relation Age of Onset  . Diabetes Father     Father, Mother, 4 sisters (2 living)  . Heart attack Father     (Deceased)  . Heart failure Father     (deceased 105)  . Stroke Mother     (deceased 75)  . Cancer Sister     (deceased)  . Hypertension Father   . Hypertension Mother   . Heart attack Sister   . Diabetes Sister      ROS: Macular degeneration - right eye no sig vision, left eye being treated. Cannot take ASA due to GI upset. Tolerated Pradaxa well. Full 14 point review of systems complete and found to be negative unless listed above.  Physical Exam: Blood pressure 144/109, pulse 88, temperature 97.2 F (36.2 C), temperature source Oral, resp. rate 20, height 5\' 6"  (1.676 m), weight 246 lb (111.585 kg), SpO2 97.00%.  General: Well developed, well nourished, female in no acute distress Head: Eyes PERRLA, No xanthomas.   Normocephalic and atraumatic, oropharynx without edema or exudate. Dentition:  poor Lungs: + wheeze, + rales, no crackles Heart: Heart irregular rate and rhythm with S1, S2, no murmur. pulses are 2+ upper extrem, slightly decreased lower extrem Neck: No carotid bruits. No lymphadenopathy.  JVD 8 cm. Abdomen: Bowel sounds present, abdomen soft and non-tender without masses or hernias noted. Msk:  No spine or cva tenderness. No weakness, no joint deformities or effusions. Extremities: No clubbing or cyanosis. Trace-1+  edema.  Neuro: Alert and oriented X 3. No focal deficits noted. Psych:  Good affect, responds appropriately Skin: No rashes or lesions noted.  Labs:   Lab Results  Component Value Date   WBC 8.5 06/16/2012   HGB 11.7* 06/16/2012   HCT 35.2* 06/16/2012   MCV 89.6 06/16/2012   PLT 245 06/16/2012   No results found for this basename: INR,  in the last 72 hours  Recent Labs Lab 06/14/12 0224  06/18/12 0954  NA 139  < > 133*  K 3.8  < > 3.7  CL 98  < > 92*  CO2 31  < > 22  BUN 27*  < > 53*  CREATININE 1.12*  < > 1.40*  CALCIUM 9.8  < > 8.6  PROT 7.6  --   --   BILITOT 0.3  --   --   ALKPHOS 93  --   --   ALT 19  --   --   AST 19  --   --   GLUCOSE 131*  < > 164*  < > = values in this interval not displayed.  TSH  Date/Time Value Range Status  04/04/2011  9:21 PM 2.437  0.350 - 4.500 uIU/mL Final   Echo: 05/08/2011 Study Conclusions - Left ventricle: The cavity size was mildly dilated. Wall thickness was normal. Systolic function was low normal to mildly reduced. The estimated ejection  fraction was in the range of 50% to 55%. Wall motion was normal; there were no regional wall motion abnormalities. Features are consistent with a pseudonormal left ventricular filling pattern, with concomitant abnormal relaxation and increased filling pressure (grade 2 diastolic dysfunction). - Aortic valve: There was no stenosis. - Mitral valve: Trivial regurgitation. - Left atrium: The atrium was mildly dilated. - Right ventricle: The cavity size was  normal. Systolic function was normal. - Pulmonary arteries: PA peak pressure: 24mm Hg (S). - Inferior vena cava: The vessel was normal in size; the respirophasic diameter changes were in the normal range (= 50%); findings are consistent with normal central venous Pressure.  Impressions: - Mildly dilated LV with low nomal to mildly reduced systolic function, EF 50-55%. Moderate diastolic dysfunction. Normal RV size and systolic function. No significant valvular abnormalities.  ECG: 18-Jun-2012 10:27:51 Atrial fibrillation Vent. rate 121 BPM PR interval * ms QRS duration 100 ms QT/QTc 312/443 ms P-R-T axes * 38 38 **  14-Jun-2012 02:09:19  SINUS RHYTHM ~ normal P axis, V-rate 50- 99 Standard 12 Lead Report ~ Unconfirmed Interpretation Normal ECG Vent. rate 83 BPM PR interval 180 ms QRS duration 96 ms QT/QTc 372/437 ms P-R-T axes 86 38 66  Radiology:  Dg Chest Port 1 View 06/18/2012   *RADIOLOGY REPORT*  Clinical Data: Shortness of breath.  Mid chest pain.  Productive cough.  PORTABLE CHEST - 1 VIEW  Comparison: 06/14/2012  Findings: Heart size and pulmonary vascularity are normal and the lungs are clear.  No acute osseous abnormality.  IMPRESSION: No acute disease.   Original Report Authenticated By: Francene Boyers, M.D.    ASSESSMENT AND PLAN:   The patient was seen today by Dr Shirlee Latch, the patient evaluated and the data reviewed.   Ms Mckoy is a 79 year old female with a history of atrial fibrillation. She required cardioversion in 2013 and placed on flecainide. This was discontinued 06/04/2011. She has been asymptomatic since then, until today.     Paroxysmal atrial fibrillation - her heart rate is in the 120s-130s at rest. M.D. advise on a flecainide challenge. We can also add Cardizem to her medication regimen. She has an elevated CHADs score and is appropriate for anticoagulation. She tolerated Pradaxa well in the past, would restart. She should followup with Dr. Johney Frame as an  outpatient.  Principal Problem:   Acute asthma exacerbation Active Problems:   Hypertension   Acute respiratory distress   AKI (acute kidney injury)   Signed: Theodore Demark, PA-C 06/18/2012 12:56 PM Beeper [phone]  Co-Sign MD  Patient seen with PA, agree with the above note.  1. Atrial fibrillation: Patient has gone into atrial fibrillation with RVR in setting of an asthma exacerbation with frequent use of inhaled beta agonists.  She has a prior history of post-op atrial fibrillation.  She was on flecainide and Pradaxa for a few months in 2013.  Both were stopped because of no atrial fibrillation recurrence (Dr. Johney Frame recommended continuing Pradaxa but she did not want to stay on it).  CHADSVASC = 4.  She went into atrial fibrillation earlier today.   - Start Xarelto 15 mg daily (renally dosed).  - Continue bisoprolol at current dose for now.  - As she has been in atrial fibrillation for only a few hours, will attempt to cardiovert her.  I will give her a dose of flecainide 200 mg po x 1 (lower bolus dosing due to renal insufficiency).  Will then start flecainide 50 mg po bid 24  hours after bolus dose.   - If she stays in rapid afib despite flecainide, can add diltiazem.  - If she does not come out of atrial fibrillation with flecainide bolus, could consider DCCV tomorrow (would not need TEE as she will still have been in atrial fibrillation for < 48 hours).  - Echo 2. Asthma exacerbation: Still wheezing a lot.  Will change albuterol to Xopenex for more specific beta-2 agonism.    Marca Ancona 06/18/2012 2:20 PM

## 2012-06-18 NOTE — Progress Notes (Signed)
Physical Therapy Treatment Patient Details Name: Jordan Byrd MRN: [id number] DOB: Dec 03, 1933 Today's Date: 06/18/2012 Time: 1914-7829 PT Time Calculation (min): 25 min  PT Assessment / Plan / Recommendation Comments on Treatment Session  Pt s/p asthma exacerbation with decr mobility secondary to decr endurance and decr tolerance of activity.  Will benefit from O2 at home with sats documented in chart.  Has RW that she will use on d/c.  Discussed that she would benefit from HHPT however pt declines HHPT at this time.  Safe to d/c home from mobility standpoint with RW and home O2 when medically stable.      Follow Up Recommendations  Home health PT;Supervision - Intermittent                 Equipment Recommendations  None recommended by PT    Recommendations for Other Services OT consult  Frequency Min 3X/week   Plan Discharge plan remains appropriate;Frequency remains appropriate    Precautions / Restrictions Precautions Precautions: None Restrictions Weight Bearing Restrictions: No   Pertinent Vitals/Pain Desat to 85 % without O2.  O2 >90% with 3 LO2.  No pain.    Mobility  Bed Mobility Bed Mobility: Supine to Sit;Sit to Supine Supine to Sit: 6: Modified independent (Device/Increase time);With rails;HOB elevated Sit to Supine: 6: Modified independent (Device/Increase time);HOB elevated;With rail Transfers Transfers: Sit to Stand;Stand to Sit Sit to Stand: 6: Modified independent (Device/Increase time);From bed Stand to Sit: 6: Modified independent (Device/Increase time);To bed Ambulation/Gait Ambulation/Gait Assistance: 5: Supervision Ambulation Distance (Feet): 125 Feet Assistive device: Rolling walker Ambulation/Gait Assistance Details:  (Fatigues quickly. DOE3/4.  Amb on 3LO2. Standing rests need ) Gait Pattern: Step-through pattern;Decreased stride length Gait velocity: household ambulation     PT Goals Acute Rehab PT Goals PT Goal Formulation: With  patient Time For Goal Achievement: 06/24/12 Potential to Achieve Goals: Good PT Goal: Ambulate - Progress: Progressing toward goal  Visit Information  Last PT Received On: 06/18/12 Assistance Needed: +1    Subjective Data  Subjective: "I get so out of breath."   Cognition  Cognition Arousal/Alertness: Awake/alert Behavior During Therapy: WFL for tasks assessed/performed Overall Cognitive Status: Within Functional Limits for tasks assessed    Balance  Balance Balance Assessed: Yes Static Standing Balance Static Standing - Balance Support: Bilateral upper extremity supported;During functional activity Static Standing - Level of Assistance: 5: Stand by assistance Static Standing - Comment/# of Minutes: 3 minutes with RW with steadiness on feet.  End of Session PT - End of Session Equipment Utilized During Treatment: Oxygen Activity Tolerance: Patient limited by fatigue Patient left: in bed;with call bell/phone within reach        [facility] 06/18/2012, 10:20 AM [facility] Rehabilitation [phone] (pager)

## 2012-06-19 ENCOUNTER — Encounter (HOSPITAL_COMMUNITY)
Admission: EM | Disposition: A | Discharge status: Home / Self Care | Payer: Self-pay | Source: Home / Self Care | Attending: Internal Medicine

## 2012-06-19 ENCOUNTER — Inpatient Hospital Stay (HOSPITAL_COMMUNITY): Payer: Medicare Other | Admitting: Anesthesiology

## 2012-06-19 ENCOUNTER — Encounter (HOSPITAL_COMMUNITY): Payer: Self-pay | Admitting: Anesthesiology

## 2012-06-19 DIAGNOSIS — I4891 Unspecified atrial fibrillation: Secondary | ICD-10-CM

## 2012-06-19 HISTORY — PX: CARDIOVERSION: SHX1299

## 2012-06-19 LAB — BASIC METABOLIC PANEL
BUN: 56 mg/dL — ABNORMAL HIGH (ref 6–23)
CO2: 29 mEq/L (ref 19–32)
Calcium: 8.5 mg/dL (ref 8.4–10.5)
Creatinine, Ser: 1.41 mg/dL — ABNORMAL HIGH (ref 0.50–1.10)
GFR calc non Af Amer: 35 mL/min — ABNORMAL LOW (ref >90)
Glucose, Bld: 126 mg/dL — ABNORMAL HIGH (ref 70–99)

## 2012-06-19 SURGERY — CARDIOVERSION
Anesthesia: General | Wound class: Clean

## 2012-06-19 MED ORDER — LIDOCAINE HCL (CARDIAC) 20 MG/ML IV SOLN
INTRAVENOUS | Status: DC | PRN
  Administered 2012-06-19: 60 mg via INTRAVENOUS

## 2012-06-19 MED ORDER — BENZONATATE 100 MG PO CAPS: 100.0000 mg | ORAL_CAPSULE | Freq: Three times a day (TID) | ORAL | Status: DC | PRN

## 2012-06-19 MED ORDER — RIVAROXABAN 15 MG PO TABS: 15.0000 mg | ORAL_TABLET | Freq: Every day | ORAL | Status: DC

## 2012-06-19 MED ORDER — DOXYCYCLINE HYCLATE 100 MG PO CAPS: 100.0000 mg | ORAL_CAPSULE | Freq: Two times a day (BID) | ORAL | Status: DC

## 2012-06-19 MED ORDER — NEBIVOLOL HCL 10 MG PO TABS: 10.0000 mg | ORAL_TABLET | Freq: Every day | ORAL | Status: DC

## 2012-06-19 MED ORDER — ISOSORB DINITRATE-HYDRALAZINE 20-37.5 MG PO TABS: 1.0000 | ORAL_TABLET | Freq: Two times a day (BID) | ORAL | Status: DC

## 2012-06-19 MED ORDER — PROPOFOL 10 MG/ML IV BOLUS
INTRAVENOUS | Status: DC | PRN
  Administered 2012-06-19: 100 mg via INTRAVENOUS

## 2012-06-19 MED ORDER — PREDNISONE 10 MG PO TABS: ORAL_TABLET | ORAL | Status: DC

## 2012-06-19 MED ORDER — FLECAINIDE ACETATE 50 MG PO TABS: 50.0000 mg | ORAL_TABLET | Freq: Two times a day (BID) | ORAL | Status: DC

## 2012-06-19 MED ORDER — RIVAROXABAN 20 MG PO TABS: 20.0000 mg | ORAL_TABLET | Freq: Every day | ORAL | Status: DC

## 2012-06-19 NOTE — Anesthesia Procedure Notes (Signed)
Date/Time: 06/19/2012 9:13 AM Performed by: Arlice Colt B Pre-anesthesia Checklist: Patient identified, Emergency Drugs available, Suction available, Patient being monitored and Timeout performed Patient Re-evaluated:Patient Re-evaluated prior to inductionOxygen Delivery Method: Ambu bag Intubation Type: IV induction Ventilation: Mask ventilation without difficulty

## 2012-06-19 NOTE — Interval H&P Note (Signed)
History and Physical Interval Note:  06/19/2012 9:04 AM  Jordan Byrd  has presented today for surgery, with the diagnosis of rapid atrial fib  The various methods of treatment have been discussed with the patient and family. After consideration of risks, benefits and other options for treatment, the patient has consented to  Procedure(s): CARDIOVERSION-bedside(3W36) (N/A) as a surgical intervention .  The patient's history has been reviewed, patient examined, no change in status, stable for surgery.  I have reviewed the patient's chart and labs.  Questions were answered to the patient's satisfaction.     [name].

## 2012-06-19 NOTE — Preoperative (Signed)
Beta Blockers   Reason not to administer Beta Blockers:Bystolic 06/18/12 4098

## 2012-06-19 NOTE — Anesthesia Preprocedure Evaluation (Addendum)
Anesthesia Evaluation  Patient identified by MRN, date of birth, ID band Patient awake    Reviewed: Allergy & Precautions, H&P , NPO status , Patient's Chart, lab work & pertinent test results  Airway Mallampati: I TM Distance: >3 FB Neck ROM: Full    Dental   Pulmonary shortness of breath, asthma , COPD COPD inhaler and oxygen dependent, former smoker,  Admit 5/12 with COPD/asthma exacerbation          Cardiovascular hypertension, Pt. on medications and Pt. on home beta blockers + dysrhythmias Atrial Fibrillation  Convert to AFIB 5/15 after admission with COPD/asthma exacerbation   TTE 05/08/11 Study Conclusions  - Left ventricle: The cavity size was mildly dilated. Wall   thickness was normal. Systolic function was low normal to   mildly reduced. The estimated ejection fraction was in the   range of 50% to 55%. Wall motion was normal; there were no   regional wall motion abnormalities. Features are   consistent with a pseudonormal left ventricular filling   pattern, with concomitant abnormal relaxation and   increased filling pressure (grade 2 diastolic   dysfunction). - Aortic valve: There was no stenosis. - Mitral valve: Trivial regurgitation. - Left atrium: The atrium was mildly dilated. - Right ventricle: The cavity size was normal. Systolic   function was normal. - Pulmonary arteries: PA peak pressure: 24mm Hg (S). - Inferior vena cava: The vessel was normal in size; the   respirophasic diameter changes were in the normal range (=   50%); findings are consistent with normal central venous   pressure. Impressions:  - Mildly dilated LV with low nomal to mildly reduced   systolic function, EF 50-55%. Moderate diastolic   dysfunction. Normal RV size and systolic function. No   significant valvular abnormalities.    Neuro/Psych  Headaches, PSYCHIATRIC DISORDERS Anxiety Macular degeneration     GI/Hepatic negative GI  ROS, Neg liver ROS,   Endo/Other  negative endocrine ROS  Renal/GU Renal InsufficiencyRenal disease (Cr 1.41)Acute kidney injury r/t diuretics per internal med     Musculoskeletal negative musculoskeletal ROS (+)   Abdominal   Peds  Hematology  (+) Blood dyscrasia, anemia ,   Anesthesia Other Findings   Reproductive/Obstetrics negative OB ROS                      Anesthesia Physical Anesthesia Plan  ASA: III  Anesthesia Plan: General   Post-op Pain Management:    Induction: Intravenous  Airway Management Planned: Mask  Additional Equipment:   Intra-op Plan:   Post-operative Plan:   Informed Consent: I have reviewed the patients History and Physical, chart, labs and discussed the procedure including the risks, benefits and alternatives for the proposed anesthesia with the patient or authorized representative who has indicated his/her understanding and acceptance.     Plan Discussed with: CRNA and Surgeon  Anesthesia Plan Comments:         Anesthesia Quick Evaluation

## 2012-06-19 NOTE — H&P (View-Only) (Signed)
 CARDIOLOGY CONSULT NOTE   Patient ID: Jordan Byrd MRN: [id number] DOB/AGE: 10/07/1933 79 y.o.  Admit date: 06/14/2012  Primary Physician   GREEN, EDWIN JAY, MD Primary Cardiologist   JA  Reason for Consultation   Atrial fib  HPI:Jordan Byrd is a 79 y.o. female with a history of atrial fib, but not CAD. Jordan Byrd had PAF in March 2013, 1 week after d/c from the hospital after hip surgery. She was very symptomatic and it was felt safe to perform DCCV as the duration was < 48 hr. She was placed on flecainide. This was later D/C'd 06/04/2011. Pradaxa was d/c'd 09/2011.  She was admitted 5/12 with asthma/COPD exacerbation. She has been on nebulizers, antibiotics and steroids. She has been on her home dose of Bystolic 10 mg. She was in SR on admission and HR has been WNL according to documented VS. Today, her HR was noted to be irregular and rapid, ECG shows atrial fibrillation RVR. Cardiology to evaluate.   On 5/16  Jordan. Byrd was ambulating when she noted a sudden increase in her shortness of breath and had to stop. She is not aware of a rapid or irregular heart rate. She does not routinely have palpitations. The symptoms are similar to the symptoms she had prior to her admission last year for atrial fibrillation. The staff noted an increase in her heart rate and ECG was done which showed atrial fibrillation. She had no chest pain. Her shortness of breath has improved since admission but she is not back to her baseline. She is aware she will need oxygen at discharge. She feels extremely weak but she has been this way since prior to admission. By reviewing telemetry, it is possible to document when she went into atrial fibrillation. Currently, her heart rate is in the 120s. She feels weak and a little shaky but has just had a nebulizer treatment. Prior to admission, she has not had any presyncope or syncope. She has not had any of the sudden shortness of breath and weakness that she had today since  her admission last year.  Past Medical History  Diagnosis Date  . Asthma   . Shortness of breath   . Arthritis   . Anxiety   . Hypertension   . Atrial fibrillation     a. 04/04/11  . Osteoarthritis     a. 03/28/11 - R Total Hip Arthroplasty  . Cataracts, both eyes   . Pneumonia   . Bronchitis   . Pleurisy   . Ankle swelling   . Kidney infection   . Frequent urination   . Excessive urination at night   . Chronic headaches   . Weight gain      Past Surgical History  Procedure Laterality Date  . Cardiac catheterization      1980's or 90's - reportedly normal  . Abdominal hysterectomy  1962  . Appendectomy  1954  . Cholecystectomy    . Back surgery  1990  . Cataract extraction bilateral w/ anterior vitrectomy  2007/2008  . Total hip arthroplasty  03/28/2011    Procedure: TOTAL HIP ARTHROPLASTY;  Surgeon: W D Caffrey Jr., MD;  Location: [facility] OR;  Service: Orthopedics;  Laterality: Right;  . Cardioversion  04/05/2011    Procedure: CARDIOVERSION;  Surgeon: James D Allred, MD;  Location: [facility] OR;  Service: Cardiovascular;  Laterality: N/A;  . Tonsillectomy  1943  . Adenoidectomy  1949  . Foot surgery  1947  . Thoracic disc surgery    2008  . Lumbar disc surgery  2010  . Knee surgery       both knees  . Shoulder surgery      left  . Total hip arthroplasty  03/28/2011    right   Allergies  Allergen Reactions  . Aspirin Nausea And Vomiting  . Aspirin Buf(Alhyd-Mghyd-Cacar) Nausea And Vomiting  . Butazolidin (Phenylbutazone) Other (See Comments)    unknown  . Celebrex (Celecoxib) Other (See Comments)    unknown  . Codeine Nausea And Vomiting  . Doripenem Other (See Comments)    unknown  . Methocarbamol Hives  . Temazepam Other (See Comments)    unknown  . Vicodin (Hydrocodone-Acetaminophen)     Interacts with bp medication and drops blood pressure   I have reviewed the patient's current medications . amLODipine  10 mg Oral Daily  . budesonide (PULMICORT) nebulizer  solution  0.25 mg Nebulization BID  . docusate sodium  50 mg Oral BID  . flecainide  50 mg Oral Q12H  . guaiFENesin  600 mg Oral BID  . ipratropium  0.5 mg Nebulization TID  . isosorbide-hydrALAZINE  1 tablet Oral BID  . levalbuterol  1.25 mg Nebulization TID  . methylPREDNISolone (SOLU-MEDROL) injection  60 mg Intravenous Q8H  . multivitamin-lutein  1 capsule Oral BID  . nebivolol  10 mg Oral Daily  . pantoprazole  40 mg Oral BID  . rivaroxaban  15 mg Oral Daily  . sodium chloride  3 mL Intravenous Q12H   . sodium chloride 75 mL (06/18/12 1835)   acetaminophen, albuterol, benzonatate, chlorzoxazone, metoprolol, sodium chloride  Prior to Admission medications   Medication Sig Start Date End Date Taking? Authorizing Provider  acetaminophen (TYLENOL) 325 MG tablet Take 650 mg by mouth 2 (two) times daily. For temp above 102   Yes Historical Provider, MD  amLODipine (NORVASC) 10 MG tablet Take 10 mg by mouth daily.   Yes Historical Provider, MD  calcium carbonate (OS-CAL) 600 MG TABS Take 600 mg by mouth 2 (two) times daily with a meal.   Yes Historical Provider, MD  chlorzoxazone (PARAFON) 500 MG tablet Take 250 mg by mouth 3 (three) times daily as needed for muscle spasms.   Yes Historical Provider, MD  Fluticasone-Salmeterol (ADVAIR) 250-50 MCG/DOSE AEPB Inhale 1 puff into the lungs every 12 (twelve) hours as needed (for shortness of breath).    Yes Historical Provider, MD  Glucosamine-Chondroit-Vit C-Mn (GLUCOSAMINE CHONDR 1500 COMPLX PO) Take 1 tablet by mouth daily.   Yes Historical Provider, MD  hydrochlorothiazide (HYDRODIURIL) 25 MG tablet Take 25 mg by mouth daily.   Yes Historical Provider, MD  lisinopril (PRINIVIL,ZESTRIL) 40 MG tablet Take 40 mg by mouth daily.   Yes Historical Provider, MD  metoprolol succinate (TOPROL-XL) 100 MG 24 hr tablet Take 100 mg by mouth daily. Take with or immediately following a meal.   Yes Historical Provider, MD  Multiple Vitamin (MULITIVITAMIN  WITH MINERALS) TABS Take 1 tablet by mouth daily.   Yes Historical Provider, MD  Multiple Vitamins-Minerals (ICAPS PO) Take 1 tablet by mouth 2 (two) times daily.   Yes Historical Provider, MD  omeprazole (PRILOSEC) 20 MG capsule Take 20 mg by mouth daily.   Yes Historical Provider, MD  predniSONE (DELTASONE) 10 MG tablet Take 10 mg by mouth 2 (two) times daily.   Yes Historical Provider, MD     History   Social History  . Marital Status: Widowed    Spouse Name: N/A    Number   of Children: N/A  . Years of Education: N/A   Occupational History  . Retired    Social History Main Topics  . Smoking status: Former Smoker -- 21 years    Types: Cigarettes    Quit date: 03/17/1970  . Smokeless tobacco: Never Used  . Alcohol Use: No  . Drug Use: No  . Sexually Active: Not Currently   Other Topics Concern  . Not on file   Social History Narrative   Lives alone in [place].  Widowed in 2012 (husband w/ dementia & parkinsons)..   Not active, does not drive due to vision problems.    Family History  Problem Relation Age of Onset  . Diabetes Father     Father, Mother, 4 sisters (2 living)  . Heart attack Father     (Deceased)  . Heart failure Father     (deceased 47)  . Stroke Mother     (deceased 85)  . Cancer Sister     (deceased)  . Hypertension Father   . Hypertension Mother   . Heart attack Sister   . Diabetes Sister      ROS: Macular degeneration - right eye no sig vision, left eye being treated. Cannot take ASA due to GI upset. Tolerated Pradaxa well. Full 14 point review of systems complete and found to be negative unless listed above.  Physical Exam: Blood pressure 138/68, pulse 101, temperature 98.9 F (37.2 C), temperature source Axillary, resp. rate 22, height 5' 6" (1.676 m), weight 255 lb 6.4 oz (115.849 kg), SpO2 96.00%.  General: Well developed, well nourished, female in no acute distress Head: Eyes PERRLA, No xanthomas.   Normocephalic and atraumatic, oropharynx  without edema or exudate. Dentition: poor Lungs: + wheeze, + rales, no crackles Heart: Heart irregular rate and rhythm with S1, S2, no murmur. pulses are 2+ upper extrem, slightly decreased lower extrem Neck: No carotid bruits. No lymphadenopathy.  JVD . Abdomen: Bowel sounds present, abdomen soft and non-tender without masses or hernias noted. Msk:  No spine or cva tenderness. No weakness, no joint deformities or effusions. Extremities: No clubbing or cyanosis. Trace-1+  edema.  Neuro: Alert and oriented X 3. No focal deficits noted. Psych:  Good affect, responds appropriately Skin: No rashes or lesions noted.  Labs:   Lab Results  Component Value Date   WBC 8.5 06/16/2012   HGB 11.7* 06/16/2012   HCT 35.2* 06/16/2012   MCV 89.6 06/16/2012   PLT 245 06/16/2012   No results found for this basename: INR,  in the last 72 hours  Recent Labs Lab 06/14/12 0224  06/19/12 0615  NA 139  < > 136  K 3.8  < > 4.7  CL 98  < > 99  CO2 31  < > 29  BUN 27*  < > 56*  CREATININE 1.12*  < > 1.41*  CALCIUM 9.8  < > 8.5  PROT 7.6  --   --   BILITOT 0.3  --   --   ALKPHOS 93  --   --   ALT 19  --   --   AST 19  --   --   GLUCOSE 131*  < > 126*  < > = values in this interval not displayed.  TSH  Date/Time Value Range Status  04/04/2011  9:21 PM 2.437  0.350 - 4.500 uIU/mL Final   Echo: 05/08/2011 Study Conclusions - Left ventricle: The cavity size was mildly dilated. Wall thickness was normal. Systolic function   was low normal to mildly reduced. The estimated ejection fraction was in the range of 50% to 55%. Wall motion was normal; there were no regional wall motion abnormalities. Features are consistent with a pseudonormal left ventricular filling pattern, with concomitant abnormal relaxation and increased filling pressure (grade 2 diastolic dysfunction). - Aortic valve: There was no stenosis. - Mitral valve: Trivial regurgitation. - Left atrium: The atrium was mildly dilated. - Right  ventricle: The cavity size was normal. Systolic function was normal. - Pulmonary arteries: PA peak pressure: 24mm Hg (S). - Inferior vena cava: The vessel was normal in size; the respirophasic diameter changes were in the normal range (= 50%); findings are consistent with normal central venous Pressure.  Impressions: - Mildly dilated LV with low nomal to mildly reduced systolic function, EF 50-55%. Moderate diastolic dysfunction. Normal RV size and systolic function. No significant valvular abnormalities.  ECG: 18-Jun-2012 10:27:51 Atrial fibrillation Vent. rate 121 BPM PR interval * Jordan QRS duration 100 Jordan QT/QTc 312/443 Jordan P-R-T axes * 38 38 **  14-Jun-2012 02:09:19  SINUS RHYTHM ~ normal P axis, V-rate 50- 99 Standard 12 Lead Report ~ Unconfirmed Interpretation Normal ECG Vent. rate 83 BPM PR interval 180 Jordan QRS duration 96 Jordan QT/QTc 372/437 Jordan P-R-T axes 86 38 66  Radiology:  Dg Chest Port 1 View 06/18/2012   *RADIOLOGY REPORT*  Clinical Data: Shortness of breath.  Mid chest pain.  Productive cough.  PORTABLE CHEST - 1 VIEW  Comparison: 06/14/2012  Findings: Heart size and pulmonary vascularity are normal and the lungs are clear.  No acute osseous abnormality.  IMPRESSION: No acute disease.   Original Report Authenticated By: James Maxwell, M.D.    ASSESSMENT AND PLAN:     Jordan Scheff is a 78-year-old female with a history of atrial fibrillation. She required cardioversion in 2013 and placed on flecainide. This was discontinued 06/04/2011. She has been asymptomatic since then, until today.     Paroxysmal atrial fibrillation - her heart rate is in the 120s-130s at rest. M.D. advise on a flecainide challenge. We can also add Cardizem to her medication regimen. She has an elevated CHADs score and is appropriate for anticoagulation. She tolerated Pradaxa well in the past, would restart. She should followup with Dr. Allred as an outpatient.  Will proceed with cardioversion today at  9 AM.    Principal Problem:   Acute asthma exacerbation Active Problems:   Hypertension   Acute respiratory distress   AKI (acute kidney injury)   

## 2012-06-19 NOTE — Progress Notes (Signed)
CARDIOLOGY CONSULT NOTE   Patient ID: Jordan Byrd MRN: [id number] DOB/AGE: [birth year] 79 y.o.  Admit date: 06/14/2012  Primary Physician   GREEN, Lorenda Ishihara, MD Primary Cardiologist   JA  Reason for Consultation   Atrial fib  YNW:GNFAO Jordan Byrd is a 79 y.o. female with a history of atrial fib, but not CAD. Jordan Byrd had PAF in March 2013, 1 week after d/c from the hospital after hip surgery. She was very symptomatic and it was felt safe to perform DCCV as the duration was < 48 hr. She was placed on flecainide. This was later D/C'd 06/04/2011. Pradaxa was d/c'd 09/2011.  She was admitted 5/12 with asthma/COPD exacerbation. She has been on nebulizers, antibiotics and steroids. She has been on her home dose of Bystolic 10 mg. She was in SR on admission and HR has been WNL according to documented VS. Today, her HR was noted to be irregular and rapid, ECG shows atrial fibrillation RVR. Cardiology to evaluate.   On 5/16  Jordan. Byrd was ambulating when she noted a sudden increase in her shortness of breath and had to stop. She is not aware of a rapid or irregular heart rate. She does not routinely have palpitations. The symptoms are similar to the symptoms she had prior to her admission last year for atrial fibrillation. The staff noted an increase in her heart rate and ECG was done which showed atrial fibrillation. She had no chest pain. Her shortness of breath has improved since admission but she is not back to her baseline. She is aware she will need oxygen at discharge. She feels extremely weak but she has been this way since prior to admission. By reviewing telemetry, it is possible to document when she went into atrial fibrillation. Currently, her heart rate is in the 120s. She feels weak and a little shaky but has just had a nebulizer treatment. Prior to admission, she has not had any presyncope or syncope. She has not had any of the sudden shortness of breath and weakness that she had today since  her admission last year.  Past Medical History  Diagnosis Date  . Asthma   . Shortness of breath   . Arthritis   . Anxiety   . Hypertension   . Atrial fibrillation     a. 04/04/11  . Osteoarthritis     a. 03/28/11 - R Total Hip Arthroplasty  . Cataracts, both eyes   . Pneumonia   . Bronchitis   . Pleurisy   . Ankle swelling   . Kidney infection   . Frequent urination   . Excessive urination at night   . Chronic headaches   . Weight gain      Past Surgical History  Procedure Laterality Date  . Cardiac catheterization      1980'Jordan or 90'Jordan - reportedly normal  . Abdominal hysterectomy  1962  . Appendectomy  1954  . Cholecystectomy    . Back surgery  1990  . Cataract extraction bilateral w/ anterior vitrectomy  2007/2008  . Total hip arthroplasty  03/28/2011    Procedure: TOTAL HIP ARTHROPLASTY;  Surgeon: Thera Flake., MD;  Location: [facility] OR;  Service: Orthopedics;  Laterality: Right;  . Cardioversion  04/05/2011    Procedure: CARDIOVERSION;  Surgeon: Gardiner Rhyme, MD;  Location: [facility] OR;  Service: Cardiovascular;  Laterality: N/A;  . Tonsillectomy  1943  . Adenoidectomy  1949  . Foot surgery  1947  . Thoracic disc surgery  2008  . Lumbar disc surgery  2010  . Knee surgery       both knees  . Shoulder surgery      left  . Total hip arthroplasty  03/28/2011    right   Allergies  Allergen Reactions  . Aspirin Nausea And Vomiting  . Aspirin Buf(Alhyd-Mghyd-Cacar) Nausea And Vomiting  . Butazolidin (Phenylbutazone) Other (See Comments)    unknown  . Celebrex (Celecoxib) Other (See Comments)    unknown  . Codeine Nausea And Vomiting  . Doripenem Other (See Comments)    unknown  . Methocarbamol Hives  . Temazepam Other (See Comments)    unknown  . Vicodin (Hydrocodone-Acetaminophen)     Interacts with bp medication and drops blood pressure   I have reviewed the patient'Jordan current medications . amLODipine  10 mg Oral Daily  . budesonide (PULMICORT) nebulizer  solution  0.25 mg Nebulization BID  . docusate sodium  50 mg Oral BID  . flecainide  50 mg Oral Q12H  . guaiFENesin  600 mg Oral BID  . ipratropium  0.5 mg Nebulization TID  . isosorbide-hydrALAZINE  1 tablet Oral BID  . levalbuterol  1.25 mg Nebulization TID  . methylPREDNISolone (SOLU-MEDROL) injection  60 mg Intravenous Q8H  . multivitamin-lutein  1 capsule Oral BID  . nebivolol  10 mg Oral Daily  . pantoprazole  40 mg Oral BID  . rivaroxaban  15 mg Oral Daily  . sodium chloride  3 mL Intravenous Q12H   . sodium chloride 75 mL (06/18/12 1835)   acetaminophen, albuterol, benzonatate, chlorzoxazone, metoprolol, sodium chloride  Prior to Admission medications   Medication Sig Start Date End Date Taking? Authorizing Provider  acetaminophen (TYLENOL) 325 MG tablet Take 650 mg by mouth 2 (two) times daily. For temp above 102   Yes Historical Provider, MD  amLODipine (NORVASC) 10 MG tablet Take 10 mg by mouth daily.   Yes Historical Provider, MD  calcium carbonate (OS-CAL) 600 MG TABS Take 600 mg by mouth 2 (two) times daily with a meal.   Yes Historical Provider, MD  chlorzoxazone (PARAFON) 500 MG tablet Take 250 mg by mouth 3 (three) times daily as needed for muscle spasms.   Yes Historical Provider, MD  Fluticasone-Salmeterol (ADVAIR) 250-50 MCG/DOSE AEPB Inhale 1 puff into the lungs every 12 (twelve) hours as needed (for shortness of breath).    Yes Historical Provider, MD  Glucosamine-Chondroit-Vit C-Mn (GLUCOSAMINE CHONDR 1500 COMPLX PO) Take 1 tablet by mouth daily.   Yes Historical Provider, MD  hydrochlorothiazide (HYDRODIURIL) 25 MG tablet Take 25 mg by mouth daily.   Yes Historical Provider, MD  lisinopril (PRINIVIL,ZESTRIL) 40 MG tablet Take 40 mg by mouth daily.   Yes Historical Provider, MD  metoprolol succinate (TOPROL-XL) 100 MG 24 hr tablet Take 100 mg by mouth daily. Take with or immediately following a meal.   Yes Historical Provider, MD  Multiple Vitamin (MULITIVITAMIN  WITH MINERALS) TABS Take 1 tablet by mouth daily.   Yes Historical Provider, MD  Multiple Vitamins-Minerals (ICAPS PO) Take 1 tablet by mouth 2 (two) times daily.   Yes Historical Provider, MD  omeprazole (PRILOSEC) 20 MG capsule Take 20 mg by mouth daily.   Yes Historical Provider, MD  predniSONE (DELTASONE) 10 MG tablet Take 10 mg by mouth 2 (two) times daily.   Yes Historical Provider, MD     History   Social History  . Marital Status: Widowed    Spouse Name: N/A    Number  of Children: N/A  . Years of Education: N/A   Occupational History  . Retired    Social History Main Topics  . Smoking status: Former Smoker -- 21 years    Types: Cigarettes    Quit date: 03/17/1970  . Smokeless tobacco: Never Used  . Alcohol Use: No  . Drug Use: No  . Sexually Active: Not Currently   Other Topics Concern  . Not on file   Social History Narrative   Lives alone in [place].  Widowed in 2012 (husband w/ dementia & parkinsons)..   Not active, does not drive due to vision problems.    Family History  Problem Relation Age of Onset  . Diabetes Father     Father, Mother, 4 sisters (2 living)  . Heart attack Father     (Deceased)  . Heart failure Father     (deceased 79)  . Stroke Mother     (deceased 69)  . Cancer Sister     (deceased)  . Hypertension Father   . Hypertension Mother   . Heart attack Sister   . Diabetes Sister      ROS: Macular degeneration - right eye no sig vision, left eye being treated. Cannot take ASA due to GI upset. Tolerated Pradaxa well. Full 14 point review of systems complete and found to be negative unless listed above.  Physical Exam: Blood pressure 138/68, pulse 101, temperature 98.9 F (37.2 C), temperature source Axillary, resp. rate 22, height 5\' 6"  (1.676 m), weight 255 lb 6.4 oz (115.849 kg), SpO2 96.00%.  General: Well developed, well nourished, female in no acute distress Head: Eyes PERRLA, No xanthomas.   Normocephalic and atraumatic, oropharynx  without edema or exudate. Dentition: poor Lungs: + wheeze, + rales, no crackles Heart: Heart irregular rate and rhythm with S1, S2, no murmur. pulses are 2+ upper extrem, slightly decreased lower extrem Neck: No carotid bruits. No lymphadenopathy.  JVD . Abdomen: Bowel sounds present, abdomen soft and non-tender without masses or hernias noted. Msk:  No spine or cva tenderness. No weakness, no joint deformities or effusions. Extremities: No clubbing or cyanosis. Trace-1+  edema.  Neuro: Alert and oriented X 3. No focal deficits noted. Psych:  Good affect, responds appropriately Skin: No rashes or lesions noted.  Labs:   Lab Results  Component Value Date   WBC 8.5 06/16/2012   HGB 11.7* 06/16/2012   HCT 35.2* 06/16/2012   MCV 89.6 06/16/2012   PLT 245 06/16/2012   No results found for this basename: INR,  in the last 72 hours  Recent Labs Lab 06/14/12 0224  06/19/12 0615  NA 139  < > 136  K 3.8  < > 4.7  CL 98  < > 99  CO2 31  < > 29  BUN 27*  < > 56*  CREATININE 1.12*  < > 1.41*  CALCIUM 9.8  < > 8.5  PROT 7.6  --   --   BILITOT 0.3  --   --   ALKPHOS 93  --   --   ALT 19  --   --   AST 19  --   --   GLUCOSE 131*  < > 126*  < > = values in this interval not displayed.  TSH  Date/Time Value Range Status  04/04/2011  9:21 PM 2.437  0.350 - 4.500 uIU/mL Final   Echo: 05/08/2011 Study Conclusions - Left ventricle: The cavity size was mildly dilated. Wall thickness was normal. Systolic function  was low normal to mildly reduced. The estimated ejection fraction was in the range of 50% to 55%. Wall motion was normal; there were no regional wall motion abnormalities. Features are consistent with a pseudonormal left ventricular filling pattern, with concomitant abnormal relaxation and increased filling pressure (grade 2 diastolic dysfunction). - Aortic valve: There was no stenosis. - Mitral valve: Trivial regurgitation. - Left atrium: The atrium was mildly dilated. - Right  ventricle: The cavity size was normal. Systolic function was normal. - Pulmonary arteries: PA peak pressure: 24mm Hg (Jordan). - Inferior vena cava: The vessel was normal in size; the respirophasic diameter changes were in the normal range (= 50%); findings are consistent with normal central venous Pressure.  Impressions: - Mildly dilated LV with low nomal to mildly reduced systolic function, EF 50-55%. Moderate diastolic dysfunction. Normal RV size and systolic function. No significant valvular abnormalities.  ECG: 18-Jun-2012 10:27:51 Atrial fibrillation Vent. rate 121 BPM PR interval * Jordan QRS duration 100 Jordan QT/QTc 312/443 Jordan P-R-T axes * 38 38 **  14-Jun-2012 02:09:19  SINUS RHYTHM ~ normal P axis, V-rate 50- 99 Standard 12 Lead Report ~ Unconfirmed Interpretation Normal ECG Vent. rate 83 BPM PR interval 180 Jordan QRS duration 96 Jordan QT/QTc 372/437 Jordan P-R-T axes 86 38 66  Radiology:  Dg Chest Port 1 View 06/18/2012   *RADIOLOGY REPORT*  Clinical Data: Shortness of breath.  Mid chest pain.  Productive cough.  PORTABLE CHEST - 1 VIEW  Comparison: 06/14/2012  Findings: Heart size and pulmonary vascularity are normal and the lungs are clear.  No acute osseous abnormality.  IMPRESSION: No acute disease.   Original Report Authenticated By: Francene Boyers, M.D.    ASSESSMENT AND PLAN:     Jordan Byrd is a 79 year old female with a history of atrial fibrillation. She required cardioversion in 2013 and placed on flecainide. This was discontinued 06/04/2011. She has been asymptomatic since then, until today.     Paroxysmal atrial fibrillation - her heart rate is in the 120s-130s at rest. M.D. advise on a flecainide challenge. We can also add Cardizem to her medication regimen. She has an elevated CHADs score and is appropriate for anticoagulation. She tolerated Pradaxa well in the past, would restart. She should followup with Dr. Johney Byrd as an outpatient.  Will proceed with cardioversion today at  9 AM.    Principal Problem:   Acute asthma exacerbation Active Problems:   Hypertension   Acute respiratory distress   AKI (acute kidney injury)

## 2012-06-19 NOTE — Transfer of Care (Signed)
Immediate Anesthesia Transfer of Care Note  Patient: Jordan Byrd  Procedure(s) Performed: Procedure(s): CARDIOVERSION-bedside(3W36) (N/A)  Patient Location: Nursing Unit  Anesthesia Type:General  Level of Consciousness: awake, alert  and oriented  Airway & Oxygen Therapy: Patient Spontanous Breathing and Patient connected to nasal cannula oxygen  Post-op Assessment: Report given to PACU RN and Post -op Vital signs reviewed and stable  Post vital signs: Reviewed and stable  Complications: No apparent anesthesia complications

## 2012-06-19 NOTE — Discharge Summary (Signed)
Physician Discharge Summary  Jordan Byrd GEX:[id number] DOB: 1933/06/11 DOA: 06/14/2012  PCP: Jordan Sack, MD  Admit date: 06/14/2012 Discharge date: 06/19/2012  Time spent: 45 minutes  Recommendations for Outpatient Follow-up:  1. Follow up with PCP in 1 week for asthma 2. Follow up with cardiology in 2-4 weeks.  Discharge Diagnoses:  Principal Problem:   Acute asthma exacerbation Active Problems:   Hypertension   Paroxysmal atrial fibrillation   Acute respiratory distress   AKI (acute kidney injury)   Discharge Condition: stable  Diet recommendation: heart healthy   Filed Weights   06/14/12 0521 06/15/12 0623 06/19/12 0500  Weight: 111.63 kg (246 lb 1.6 oz) 111.585 kg (246 lb) 115.849 kg (255 lb 6.4 oz)    History of present illness:  79 yo female h/o asthma, htn, comes in with several days of cough, subj fevers, worsening sob and wheezing. Eating ok. No n/v/d. No le edema or swelling. Xsmoker. Has had several bouts of bronchitis this past winter.   Hospital Course:  AKI (acute kidney injury)  - dc/d HCTZ start iv VI Fluids, creatinine improved..  - most likely due to insensible losses and diuretics.  - will need a b-met by PCP in 2 weeks.  Acute respiratory distress/Acute asthma exacerbation:  - initially started on on antibiotics, IV steroids and inhalers. - Desaturating with ambulation will need home O2. Sat. > 95% 2l repeat O2 with ambulation.  - she had a very slow and gradual improvement was kept on IV steroids for 4 days then switch to orals. - Cont steroids as a  long tapered as an outpatient.   Paroxsymal Atrial fibrillation:  - she develop a.fib with RVR during her hospital stay, a.fib confirmed  On EKG.  - cardiology consulted recommended nebivolol, started her on flecainide and xarelto. CHADS score 4, as her HR did not improved on flecainide DCCV was recommend and performed she remained in SR. - she will follow up with her cardiologist in 2-4  weeks. - will continue xarelto.  Hypertension:  - stable monitor.   Procedures:  DCCV 5.17.2014  Consultations:  cardiology  Discharge Exam: Filed Vitals:   06/19/12 0500 06/19/12 0941 06/19/12 1011 06/19/12 1050  BP: 138/68  129/60 164/64  Pulse: 101  78   Temp: 98.9 F (37.2 C)  98 F (36.7 C)   TempSrc:   Oral   Resp: 22  20   Height:      Weight: 115.849 kg (255 lb 6.4 oz)     SpO2: 96% 98% 98%     General: A&OP x3 Cardiovascular: RRR Respiratory: good air movement CTA B/L  Discharge Instructions  Discharge Orders   Future Orders Complete By Expires     Diet - low sodium heart healthy  As directed     Increase activity slowly  As directed         Medication List    STOP taking these medications       metoprolol succinate 100 MG 24 hr tablet  Commonly known as:  TOPROL-XL      TAKE these medications       acetaminophen 325 MG tablet  Commonly known as:  TYLENOL  Take 650 mg by mouth 2 (two) times daily. For temp above 102     amLODipine 10 MG tablet  Commonly known as:  NORVASC  Take 10 mg by mouth daily.     benzonatate 100 MG capsule  Commonly known as:  TESSALON  Take 1 capsule (  100 mg total) by mouth 3 (three) times daily as needed for cough.     calcium carbonate 600 MG Tabs  Commonly known as:  OS-CAL  Take 600 mg by mouth 2 (two) times daily with a meal.     chlorzoxazone 500 MG tablet  Commonly known as:  PARAFON  Take 250 mg by mouth 3 (three) times daily as needed for muscle spasms.     doxycycline 100 MG capsule  Commonly known as:  VIBRAMYCIN  Take 1 capsule (100 mg total) by mouth 2 (two) times daily.     flecainide 50 MG tablet  Commonly known as:  TAMBOCOR  Take 1 tablet (50 mg total) by mouth every 12 (twelve) hours.     Fluticasone-Salmeterol 250-50 MCG/DOSE Aepb  Commonly known as:  ADVAIR  Inhale 1 puff into the lungs every 12 (twelve) hours as needed (for shortness of breath).     GLUCOSAMINE CHONDR 1500  COMPLX PO  Take 1 tablet by mouth daily.     hydrochlorothiazide 25 MG tablet  Commonly known as:  HYDRODIURIL  Take 25 mg by mouth daily.     ICAPS PO  Take 1 tablet by mouth 2 (two) times daily.     isosorbide-hydrALAZINE 20-37.5 MG per tablet  Commonly known as:  BIDIL  Take 1 tablet by mouth 2 (two) times daily.     lisinopril 40 MG tablet  Commonly known as:  PRINIVIL,ZESTRIL  Take 40 mg by mouth daily.     multivitamin with minerals Tabs  Take 1 tablet by mouth daily.     nebivolol 10 MG tablet  Commonly known as:  BYSTOLIC  Take 1 tablet (10 mg total) by mouth daily.     omeprazole 20 MG capsule  Commonly known as:  PRILOSEC  Take 20 mg by mouth daily.     predniSONE 10 MG tablet  Commonly known as:  DELTASONE  Takes 6 tablets for 2 days, then 5 tablets for 2 days, then 4 tablets for 2 days, then 3 tablets for 2 days, then 2 tabs for 2 days, then 1 tab for 2 days, and then stop.     Rivaroxaban 20 MG Tabs  Commonly known as:  XARELTO  Take 1 tablet (20 mg total) by mouth daily.       Allergies  Allergen Reactions  . Aspirin Nausea And Vomiting  . Aspirin Buf(Alhyd-Mghyd-Cacar) Nausea And Vomiting  . Butazolidin (Phenylbutazone) Other (See Comments)    unknown  . Celebrex (Celecoxib) Other (See Comments)    unknown  . Codeine Nausea And Vomiting  . Doripenem Other (See Comments)    unknown  . Methocarbamol Hives  . Temazepam Other (See Comments)    unknown  . Vicodin (Hydrocodone-Acetaminophen)     Interacts with bp medication and drops blood pressure      The results of significant diagnostics from this hospitalization (including imaging, microbiology, ancillary and laboratory) are listed below for reference.    Significant Diagnostic Studies: Dg Chest Port 1 View  06/18/2012   *RADIOLOGY REPORT*  Clinical Data: Shortness of breath.  Mid chest pain.  Productive cough.  PORTABLE CHEST - 1 VIEW  Comparison: 06/14/2012  Findings: Heart size and  pulmonary vascularity are normal and the lungs are clear.  No acute osseous abnormality.  IMPRESSION: No acute disease.   Original Report Authenticated By: Francene Boyers, M.D.   Dg Chest Portable 1 View  06/14/2012   *RADIOLOGY REPORT*  Clinical Data: Shortness of breath  PORTABLE CHEST - 1 VIEW  Comparison: 04/04/2011  Findings: Aortic atherosclerosis.  Cardiomediastinal contours otherwise within normal range.  Mild lung base atelectasis or scarring.  Otherwise, no confluent airspace opacity, pleural effusion, or pneumothorax.  Cervical fusion hardware is partially imaged.  No acute osseous finding.  IMPRESSION: Mild lung base opacities, favor atelectasis or scarring.   Original Report Authenticated By: Jearld Lesch, M.D.    Microbiology: No results found for this or any previous visit (from the past 240 hour(s)).   Labs: Basic Metabolic Panel:  Recent Labs Lab 06/14/12 0224 06/14/12 0244 06/16/12 0415 06/18/12 0954 06/19/12 0615  NA 139 140 134* 133* 136  K 3.8 3.9 4.1 3.7 4.7  CL 98 100 95* 92* 99  CO2 31  --  28 22 29   GLUCOSE 131* 134* 139* 164* 126*  BUN 27* 29* 58* 53* 56*  CREATININE 1.12* 1.20* 1.77* 1.40* 1.41*  CALCIUM 9.8  --  8.7 8.6 8.5   Liver Function Tests:  Recent Labs Lab 06/14/12 0224  AST 19  ALT 19  ALKPHOS 93  BILITOT 0.3  PROT 7.6  ALBUMIN 4.3   No results found for this basename: LIPASE, AMYLASE,  in the last 168 hours No results found for this basename: AMMONIA,  in the last 168 hours CBC:  Recent Labs Lab 06/14/12 0224 06/14/12 0244 06/16/12 0415  WBC 6.1  --  8.5  HGB 13.4 14.6 11.7*  HCT 40.7 43.0 35.2*  MCV 91.7  --  89.6  PLT 231  --  245   Cardiac Enzymes: No results found for this basename: CKTOTAL, CKMB, CKMBINDEX, TROPONINI,  in the last 168 hours BNP: BNP (last 3 results) No results found for this basename: PROBNP,  in the last 8760 hours CBG:  Recent Labs Lab 06/18/12 2108  GLUCAP 201*    Signed:  Marinda Elk  [facility] 06/19/2012, 11:14 AM

## 2012-06-19 NOTE — Anesthesia Postprocedure Evaluation (Signed)
  Anesthesia Post-op Note  Patient: Jordan Byrd  Procedure(s) Performed: Procedure(s): CARDIOVERSION-bedside(3W36) (N/A)  Patient Location: Nursing Unit  Anesthesia Type:General  Level of Consciousness: awake, alert  and oriented  Airway and Oxygen Therapy: Patient Spontanous Breathing and Patient connected to nasal cannula oxygen  Post-op Pain: none  Post-op Assessment: Patient's Cardiovascular Status Stable, Respiratory Function Stable, Patent Airway and No signs of Nausea or vomiting  Post-op Vital Signs: Reviewed and stable  Complications: No apparent anesthesia complications

## 2012-06-19 NOTE — CV Procedure (Signed)
    Cardioversion Note  Jordan Byrd [id number] 1933-09-27  Procedure: DC Cardioversion Indications: atrial fib  Procedure Details Consent: Obtained Time Out: Verified patient identification, verified procedure, site/side was marked, verified correct patient position, special equipment/implants available, Radiology Safety Procedures followed,  medications/allergies/relevent history reviewed, required imaging and test results available.  Performed  The patient has been on adequate anticoagulation.  The patient received IV Lidocaine 60 mg and Propofol 100 mg  for sedation.  Synchronous cardioversion was performed at 120 joules.  The cardioversion was successful    Complications: No apparent complications Patient did tolerate procedure well.   Vesta Mixer, Montez Hageman., MD, [facility] 06/19/2012, 9:19 AM

## 2012-06-21 ENCOUNTER — Encounter (HOSPITAL_COMMUNITY): Payer: Self-pay | Admitting: Cardiology

## 2012-06-26 ENCOUNTER — Observation Stay (HOSPITAL_COMMUNITY)
Admission: EM | Admit: 2012-06-26 | Discharge: 2012-06-28 | Disposition: A | Discharge status: Home / Self Care | Payer: Medicare Other | Attending: Internal Medicine | Admitting: Internal Medicine

## 2012-06-26 ENCOUNTER — Emergency Department (HOSPITAL_COMMUNITY): Payer: Medicare Other

## 2012-06-26 ENCOUNTER — Observation Stay (HOSPITAL_COMMUNITY): Payer: Medicare Other

## 2012-06-26 ENCOUNTER — Encounter (HOSPITAL_COMMUNITY): Payer: Self-pay | Admitting: Physical Medicine and Rehabilitation

## 2012-06-26 DIAGNOSIS — I5030 Unspecified diastolic (congestive) heart failure: Secondary | ICD-10-CM

## 2012-06-26 DIAGNOSIS — J45901 Unspecified asthma with (acute) exacerbation: Secondary | ICD-10-CM

## 2012-06-26 DIAGNOSIS — I509 Heart failure, unspecified: Secondary | ICD-10-CM | POA: Insufficient documentation

## 2012-06-26 DIAGNOSIS — J4489 Other specified chronic obstructive pulmonary disease: Secondary | ICD-10-CM | POA: Insufficient documentation

## 2012-06-26 DIAGNOSIS — Z7901 Long term (current) use of anticoagulants: Secondary | ICD-10-CM | POA: Insufficient documentation

## 2012-06-26 DIAGNOSIS — F419 Anxiety disorder, unspecified: Secondary | ICD-10-CM

## 2012-06-26 DIAGNOSIS — J449 Chronic obstructive pulmonary disease, unspecified: Secondary | ICD-10-CM | POA: Insufficient documentation

## 2012-06-26 DIAGNOSIS — Z79899 Other long term (current) drug therapy: Secondary | ICD-10-CM | POA: Insufficient documentation

## 2012-06-26 DIAGNOSIS — I480 Paroxysmal atrial fibrillation: Secondary | ICD-10-CM

## 2012-06-26 DIAGNOSIS — I129 Hypertensive chronic kidney disease with stage 1 through stage 4 chronic kidney disease, or unspecified chronic kidney disease: Secondary | ICD-10-CM | POA: Insufficient documentation

## 2012-06-26 DIAGNOSIS — R42 Dizziness and giddiness: Secondary | ICD-10-CM | POA: Diagnosis present

## 2012-06-26 DIAGNOSIS — N184 Chronic kidney disease, stage 4 (severe): Secondary | ICD-10-CM | POA: Insufficient documentation

## 2012-06-26 DIAGNOSIS — R0603 Acute respiratory distress: Secondary | ICD-10-CM

## 2012-06-26 DIAGNOSIS — M1990 Unspecified osteoarthritis, unspecified site: Secondary | ICD-10-CM

## 2012-06-26 DIAGNOSIS — N390 Urinary tract infection, site not specified: Secondary | ICD-10-CM

## 2012-06-26 DIAGNOSIS — I10 Essential (primary) hypertension: Secondary | ICD-10-CM | POA: Diagnosis present

## 2012-06-26 DIAGNOSIS — R197 Diarrhea, unspecified: Secondary | ICD-10-CM | POA: Diagnosis present

## 2012-06-26 DIAGNOSIS — I5032 Chronic diastolic (congestive) heart failure: Secondary | ICD-10-CM | POA: Insufficient documentation

## 2012-06-26 DIAGNOSIS — E860 Dehydration: Secondary | ICD-10-CM | POA: Insufficient documentation

## 2012-06-26 DIAGNOSIS — I4891 Unspecified atrial fibrillation: Principal | ICD-10-CM | POA: Insufficient documentation

## 2012-06-26 DIAGNOSIS — N179 Acute kidney failure, unspecified: Secondary | ICD-10-CM

## 2012-06-26 LAB — CBC WITH DIFFERENTIAL/PLATELET
Basophils Absolute: 0 10*3/uL (ref 0.0–0.1)
Basophils Relative: 0 % (ref 0–1)
Eosinophils Absolute: 0 10*3/uL (ref 0.0–0.7)
Eosinophils Relative: 0 % (ref 0–5)
MCH: 29.9 pg (ref 26.0–34.0)
MCHC: 32.5 g/dL (ref 30.0–36.0)
MCV: 91.9 fL (ref 78.0–100.0)
Platelets: 276 10*3/uL (ref 150–400)
RDW: 15 % (ref 11.5–15.5)
WBC: 16.9 10*3/uL — ABNORMAL HIGH (ref 4.0–10.5)

## 2012-06-26 LAB — COMPREHENSIVE METABOLIC PANEL
ALT: 24 U/L (ref 0–35)
AST: 17 U/L (ref 0–37)
Albumin: 3.4 g/dL — ABNORMAL LOW (ref 3.5–5.2)
Alkaline Phosphatase: 54 U/L (ref 39–117)
BUN: 50 mg/dL — ABNORMAL HIGH (ref 6–23)
CO2: 32 mEq/L (ref 19–32)
Calcium: 9.4 mg/dL (ref 8.4–10.5)
Chloride: 98 mEq/L (ref 96–112)
Creatinine, Ser: 1.38 mg/dL — ABNORMAL HIGH (ref 0.50–1.10)
GFR calc Af Amer: 42 mL/min — ABNORMAL LOW (ref >90)
GFR calc non Af Amer: 36 mL/min — ABNORMAL LOW (ref >90)
Glucose, Bld: 116 mg/dL — ABNORMAL HIGH (ref 70–99)
Potassium: 5.1 mEq/L (ref 3.5–5.1)
Sodium: 138 mEq/L (ref 135–145)
Total Bilirubin: 0.5 mg/dL (ref 0.3–1.2)
Total Protein: 6 g/dL (ref 6.0–8.3)

## 2012-06-26 LAB — PRO B NATRIURETIC PEPTIDE: Pro B Natriuretic peptide (BNP): 247.9 pg/mL (ref 0–450)

## 2012-06-26 LAB — URINALYSIS, ROUTINE W REFLEX MICROSCOPIC
Bilirubin Urine: NEGATIVE
Glucose, UA: NEGATIVE mg/dL
Hgb urine dipstick: NEGATIVE
Ketones, ur: NEGATIVE mg/dL
Leukocytes, UA: NEGATIVE
Nitrite: NEGATIVE
Protein, ur: NEGATIVE mg/dL
Specific Gravity, Urine: 1.015 (ref 1.005–1.030)
Urobilinogen, UA: 0.2 mg/dL (ref 0.0–1.0)
pH: 6.5 (ref 5.0–8.0)

## 2012-06-26 LAB — TROPONIN I
Troponin I: <0.3 ng/mL (ref <0.30)
Troponin I: <0.3 ng/mL (ref <0.30)

## 2012-06-26 MED ORDER — OCUVITE-LUTEIN PO CAPS
1.0000 | ORAL_CAPSULE | Freq: Two times a day (BID) | ORAL | Status: DC
  Administered 2012-06-26 – 2012-06-28 (×4): 1 via ORAL
  Filled 2012-06-26 (×5): qty 1

## 2012-06-26 MED ORDER — ACETAMINOPHEN 650 MG RE SUPP: 650.0000 mg | Freq: Four times a day (QID) | RECTAL | Status: DC | PRN

## 2012-06-26 MED ORDER — ONDANSETRON HCL 4 MG PO TABS: 4.0000 mg | ORAL_TABLET | Freq: Four times a day (QID) | ORAL | Status: DC | PRN

## 2012-06-26 MED ORDER — ICAPS PO CAPS: 1.0000 | ORAL_CAPSULE | Freq: Two times a day (BID) | ORAL | Status: DC

## 2012-06-26 MED ORDER — CALCIUM CARBONATE 600 MG PO TABS
600.0000 mg | ORAL_TABLET | Freq: Two times a day (BID) | ORAL | Status: DC
  Filled 2012-06-26: qty 1

## 2012-06-26 MED ORDER — METHYLPREDNISOLONE SODIUM SUCC 125 MG IJ SOLR: 125.0000 mg | Freq: Once | INTRAMUSCULAR | Status: DC

## 2012-06-26 MED ORDER — NEBIVOLOL HCL 10 MG PO TABS
10.0000 mg | ORAL_TABLET | Freq: Every day | ORAL | Status: DC
  Administered 2012-06-27: 10 mg via ORAL
  Filled 2012-06-26 (×2): qty 1

## 2012-06-26 MED ORDER — SODIUM CHLORIDE 0.9 % IJ SOLN
3.0000 mL | Freq: Two times a day (BID) | INTRAMUSCULAR | Status: DC
  Administered 2012-06-26 – 2012-06-27 (×3): 3 mL via INTRAVENOUS

## 2012-06-26 MED ORDER — ONDANSETRON HCL 4 MG/2ML IJ SOLN: 4.0000 mg | Freq: Four times a day (QID) | INTRAMUSCULAR | Status: DC | PRN

## 2012-06-26 MED ORDER — ACETAMINOPHEN 325 MG PO TABS: 650.0000 mg | ORAL_TABLET | Freq: Four times a day (QID) | ORAL | Status: DC | PRN

## 2012-06-26 MED ORDER — MOMETASONE FURO-FORMOTEROL FUM 100-5 MCG/ACT IN AERO
2.0000 | INHALATION_SPRAY | Freq: Two times a day (BID) | RESPIRATORY_TRACT | Status: DC
  Administered 2012-06-26 – 2012-06-27 (×3): 2 via RESPIRATORY_TRACT
  Filled 2012-06-26 (×2): qty 8.8

## 2012-06-26 MED ORDER — CHLORZOXAZONE 500 MG PO TABS: 250.0000 mg | ORAL_TABLET | Freq: Three times a day (TID) | ORAL | Status: DC | PRN

## 2012-06-26 MED ORDER — ADULT MULTIVITAMIN W/MINERALS CH
1.0000 | ORAL_TABLET | Freq: Every day | ORAL | Status: DC
  Administered 2012-06-27 – 2012-06-28 (×2): 1 via ORAL
  Filled 2012-06-26 (×2): qty 1

## 2012-06-26 MED ORDER — FLECAINIDE ACETATE 50 MG PO TABS
50.0000 mg | ORAL_TABLET | Freq: Two times a day (BID) | ORAL | Status: DC
  Administered 2012-06-26 – 2012-06-27 (×3): 50 mg via ORAL
  Filled 2012-06-26 (×5): qty 1

## 2012-06-26 MED ORDER — SODIUM CHLORIDE 0.9 % IV SOLN
INTRAVENOUS | Status: DC
  Administered 2012-06-26: 17:00:00 via INTRAVENOUS

## 2012-06-26 MED ORDER — ISOSORB DINITRATE-HYDRALAZINE 20-37.5 MG PO TABS
1.0000 | ORAL_TABLET | Freq: Two times a day (BID) | ORAL | Status: DC
  Administered 2012-06-26 – 2012-06-27 (×3): 1 via ORAL
  Filled 2012-06-26 (×5): qty 1

## 2012-06-26 MED ORDER — SODIUM CHLORIDE 0.9 % IV BOLUS (SEPSIS)
500.0000 mL | Freq: Once | INTRAVENOUS | Status: AC
  Administered 2012-06-26: 500 mL via INTRAVENOUS

## 2012-06-26 MED ORDER — CALCIUM CARBONATE 1250 (500 CA) MG PO TABS
1.0000 | ORAL_TABLET | Freq: Two times a day (BID) | ORAL | Status: DC
  Administered 2012-06-27 – 2012-06-28 (×3): 500 mg via ORAL
  Filled 2012-06-26 (×5): qty 1

## 2012-06-26 MED ORDER — FAMOTIDINE 40 MG PO TABS
40.0000 mg | ORAL_TABLET | Freq: Every day | ORAL | Status: DC
  Administered 2012-06-26 – 2012-06-28 (×3): 40 mg via ORAL
  Filled 2012-06-26 (×3): qty 1

## 2012-06-26 MED ORDER — MECLIZINE HCL 25 MG PO TABS: 25.0000 mg | ORAL_TABLET | Freq: Three times a day (TID) | ORAL | Status: DC | PRN

## 2012-06-26 MED ORDER — RIVAROXABAN 20 MG PO TABS
20.0000 mg | ORAL_TABLET | Freq: Every day | ORAL | Status: DC
  Administered 2012-06-27: 20 mg via ORAL
  Filled 2012-06-26 (×2): qty 1

## 2012-06-26 MED ORDER — SODIUM CHLORIDE 0.9 % IV SOLN
INTRAVENOUS | Status: DC
  Administered 2012-06-26: 20:00:00 via INTRAVENOUS

## 2012-06-26 MED ORDER — ALBUTEROL SULFATE (5 MG/ML) 0.5% IN NEBU
5.0000 mg | INHALATION_SOLUTION | Freq: Once | RESPIRATORY_TRACT | Status: AC
  Administered 2012-06-26: 5 mg via RESPIRATORY_TRACT
  Filled 2012-06-26: qty 1

## 2012-06-26 MED ORDER — LEVALBUTEROL HCL 0.63 MG/3ML IN NEBU: 0.6300 mg | INHALATION_SOLUTION | Freq: Four times a day (QID) | RESPIRATORY_TRACT | Status: DC | PRN

## 2012-06-26 MED ORDER — BENZONATATE 100 MG PO CAPS: 100.0000 mg | ORAL_CAPSULE | Freq: Three times a day (TID) | ORAL | Status: DC | PRN

## 2012-06-26 NOTE — ED Notes (Signed)
Pt presents to department via [facility] for evaluation of dizziness, SOB and atrial fibrillation. History of afib with cardioversion last week. Denies chest pain at the time. Audible wheezing upon arrival to ED. Pt is alert and oriented x4. 20g LAC.

## 2012-06-26 NOTE — H&P (Signed)
[facility] History and Physical  [name]:[id number] DOB: 30-Sep-1933 DOA: 06/26/2012  Referring physician: Dr. Ranae Palms PCP: Enrique Sack, MD  Specialists: Dr. Johney Frame [facility] cardiology)  Chief Complaint: dizziness, diarrhea, blurred vision  HPI: Jordan Byrd is a 79 y.o. female with PMH of asthma/COPD (recent admission for exacerbation), atrial fibrillation (s/P cardioversion 1 week ago and is on xarelto), HTN, CKD (stage 3) and migraines; came to ED complaining of new onset dizziness and funny sensation on her chest. Patient reported for the last 2 days she has been experiencing diarrhea and that on the day of admission had severe lightheadedness, dizziness and blurred vision, all associated with funny sensation of palpitations/discomfort on her chest. Patient denies any worsening on her breathing, denies fever, chills, abdominal pain, nausea, vomiting, melena, hematochezia or any other complaints. [facility] called to admit patient for further evaluation and treatment. In ED patient was initially with soft BP (SBP in mid 90's); improved with IVF's.  Review of Systems:  Negative except as mentioned on HPI.  Past Medical History  Diagnosis Date  . Asthma   . Shortness of breath   . Arthritis   . Anxiety   . Hypertension   . Atrial fibrillation     a. 04/04/11  . Osteoarthritis     a. 03/28/11 - R Total Hip Arthroplasty  . Cataracts, both eyes   . Pneumonia   . Bronchitis   . Pleurisy   . Ankle swelling   . Kidney infection   . Frequent urination   . Excessive urination at night   . Chronic headaches   . Weight gain    Past Surgical History  Procedure Laterality Date  . Cardiac catheterization      1980's or 90's - reportedly normal  . Abdominal hysterectomy  1962  . Appendectomy  1954  . Cholecystectomy    . Back surgery  1990  . Cataract extraction bilateral w/ anterior vitrectomy  2007/2008  . Total hip arthroplasty  03/28/2011    Procedure: TOTAL HIP  ARTHROPLASTY;  Surgeon: Thera Flake., MD;  Location: [facility] OR;  Service: Orthopedics;  Laterality: Right;  . Cardioversion  04/05/2011    Procedure: CARDIOVERSION;  Surgeon: Gardiner Rhyme, MD;  Location: [facility] OR;  Service: Cardiovascular;  Laterality: N/A;  . Tonsillectomy  1943  . Adenoidectomy  1949  . Foot surgery  1947  . Thoracic disc surgery  2008  . Lumbar disc surgery  2010  . Knee surgery       both knees  . Shoulder surgery      left  . Total hip arthroplasty  03/28/2011    right  . Cardioversion N/A 06/19/2012    Procedure: CARDIOVERSION-bedside(3W36);  Surgeon: Lewayne Bunting, MD;  Location: [facility] OR;  Service: Cardiovascular;  Laterality: N/A;   Social History:  reports that she quit smoking about 42 years ago. Her smoking use included Cigarettes. She smoked 0.00 packs per day for 21 years. She has never used smokeless tobacco. She reports that she does not drink alcohol or use illicit drugs. Lives at home and denies any assistance with ADL's   Allergies  Allergen Reactions  . Aspirin Nausea And Vomiting  . Aspirin Buf(Alhyd-Mghyd-Cacar) Nausea And Vomiting  . Butazolidin (Phenylbutazone) Other (See Comments)    unknown  . Celebrex (Celecoxib) Other (See Comments)    unknown  . Codeine Nausea And Vomiting  . Doripenem Other (See Comments)    unknown  . Methocarbamol Hives  .  Temazepam Other (See Comments)    unknown  . Vicodin (Hydrocodone-Acetaminophen)     Interacts with bp medication and drops blood pressure    Family History  Problem Relation Age of Onset  . Diabetes Father     Father, Mother, 4 sisters (2 living)  . Heart attack Father     (Deceased)  . Heart failure Father     (deceased 4)  . Stroke Mother     (deceased 34)  . Cancer Sister     (deceased)  . Hypertension Father   . Hypertension Mother   . Heart attack Sister   . Diabetes Sister     Prior to Admission medications   Medication Sig Start Date End Date Taking? Authorizing Provider   acetaminophen (TYLENOL) 325 MG tablet Take 650 mg by mouth 2 (two) times daily. For temp above 102   Yes Historical Provider, MD  amLODipine (NORVASC) 10 MG tablet Take 10 mg by mouth daily.   Yes Historical Provider, MD  benzonatate (TESSALON) 100 MG capsule Take 1 capsule (100 mg total) by mouth 3 (three) times daily as needed for cough. 06/19/12  Yes Marinda Elk, MD  calcium carbonate (OS-CAL) 600 MG TABS Take 600 mg by mouth 2 (two) times daily with a meal.   Yes Historical Provider, MD  chlorzoxazone (PARAFON) 500 MG tablet Take 250 mg by mouth 3 (three) times daily as needed for muscle spasms.   Yes Historical Provider, MD  flecainide (TAMBOCOR) 50 MG tablet Take 1 tablet (50 mg total) by mouth every 12 (twelve) hours. 06/19/12  Yes Marinda Elk, MD  And he-Salmeterol (ADVAIR) 250-50 MCG/DOSE AEPB Inhale 1 puff into the lungs at bedtime.    Yes Historical Provider, MD  Glucosamine-Chondroit-Vit C-Mn (GLUCOSAMINE CHONDR 1500 COMPLX PO) Take 1 tablet by mouth daily.   Yes Historical Provider, MD  hydrochlorothiazide (HYDRODIURIL) 25 MG tablet Take 25 mg by mouth daily.   Yes Historical Provider, MD  isosorbide-hydrALAZINE (BIDIL) 20-37.5 MG per tablet Take 1 tablet by mouth 2 (two) times daily. 06/19/12  Yes Marinda Elk, MD  lisinopril (PRINIVIL,ZESTRIL) 40 MG tablet Take 40 mg by mouth daily.   Yes Historical Provider, MD  Multiple Vitamin (MULITIVITAMIN WITH MINERALS) TABS Take 1 tablet by mouth daily.   Yes Historical Provider, MD  Multiple Vitamins-Minerals (ICAPS PO) Take 1 tablet by mouth 2 (two) times daily.   Yes Historical Provider, MD  nebivolol (BYSTOLIC) 10 MG tablet Take 1 tablet (10 mg total) by mouth daily. 06/19/12  Yes Marinda Elk, MD  omeprazole (PRILOSEC) 20 MG capsule Take 20 mg by mouth daily.   Yes Historical Provider, MD  Rivaroxaban (XARELTO) 20 MG TABS Take 1 tablet (20 mg total) by mouth daily. 06/19/12  Yes Marinda Elk, MD   Physical  Exam: Filed Vitals:   06/26/12 1300 06/26/12 1400 06/26/12 1500 06/26/12 1600  BP: 119/61 154/54 146/56 140/66  Pulse: 93 96 111 99  Temp:      TempSrc:      Resp: 17 21 20 18   SpO2: 97% 98% 98% 99%     General:  Afebrile, cooperative to examination; mild anxiety/distress due to funny sensation on her chest and dizziness.  Eyes: PERRL, no icterus, no nystagmus, EOMI  ENT: mild dryness of MM, no erythema or exudates inside her mouth; no drainage out of ears  Neck: supple, no JVD, no bruits  Cardiovascular: irregular, irregular; no rubs or gallops  Respiratory: no wheezing; scattered rhonchi,  good air movement; no rales  Abdomen: soft, NT, ND, positive BS  Skin: no rash or petechiae   Musculoskeletal: LE swelling 1+ bilaterally; no joint swelling  Psychiatric: appropriate  Neurologic: CN intact, no tongue deviation, uvula midline, no sensory deficit abnormality; MS 4/5 bilaterally and simetrically.  Labs on Admission:  Basic Metabolic Panel:  Recent Labs Lab 06/26/12 1249  NA 138  K 5.1  CL 98  CO2 32  GLUCOSE 116*  BUN 50*  CREATININE 1.38*  CALCIUM 9.4   Liver Function Tests:  Recent Labs Lab 06/26/12 1249  AST 17  ALT 24  ALKPHOS 54  BILITOT 0.5  PROT 6.0  ALBUMIN 3.4*   CBC:  Recent Labs Lab 06/26/12 1249  WBC 16.9*  NEUTROABS 14.9*  HGB 11.5*  HCT 35.4*  MCV 91.9  PLT 276   Cardiac Enzymes:  Recent Labs Lab 06/26/12 1249  TROPONINI <0.30    BNP (last 3 results)  Recent Labs  06/26/12 1249  PROBNP 247.9   Radiological Exams on Admission: Dg Chest Port 1 View  06/26/2012   *RADIOLOGY REPORT*  Clinical Data: Shortness of breath.  Wheezing.  Current history of hypertension.  PORTABLE CHEST - 1 VIEW 06/26/2012 1132 hours:  Comparison: Portable chest x-ray 06/18/2012, 06/14/2012, 03/28/2011 and two-view chest x-ray 04/04/2011.  Findings: Cardiac silhouette normal in size for the AP portable technique.  Thoracic aorta  atherosclerotic, unchanged.  Hilar and mediastinal contours otherwise unremarkable.  Lungs clear. Bronchovascular markings normal.  Pulmonary vascularity normal.  No pneumothorax.  No pleural effusions.  IMPRESSION: No acute cardiopulmonary disease.   Original Report Authenticated By: Hulan Saas, M.D.    EKG:  Date: 06/26/2012  Rate: 117  Rhythm: atrial fibrillation  QRS Axis: normal  Intervals: normal  ST/T Wave abnormalities: normal  Conduction Disutrbances:none  Narrative Interpretation:  Old EKG Reviewed: changes noted   Assessment/Plan 1- dizziness/lightheadedness/palpitations: Most likely secondary to atrial fibrillation. Considerations include orthostatic changes vestibular problems. -Will admit to telemetry -Continue Tambocor and bystolic; cardiology consulted -A will check orthostatic vital signs and provide fluid resuscitation. -Cycle cardiac enzymes -CT of the head to rule out any acute abnormalities affecting cerebellar area will -Physical therapy consultation for vestibular evaluation -PRN meclizine -check TSH, B12 and Vit D  2-Atrial fibrillation: With recent cardioversion and started on Tambocor and xarelto. -will continue current medications -monitor on telemetry -consult cardiology -Continue bystolic  3-Hypertension: Initially hypotensive with good response to IV fluid resuscitation. -Will check orthostatic vital signs. -Continue bidil, bystolic and low sodium diet -holding HCTZ , lisinopril (due to renal failure and low BP).  -Will continue amlodipine  4-Dizziness: treatment as mentioned above  5-Diarrhea: with recent abx's use, will check for C. Diff  6-CKD (chronic kidney disease) stage 3, GFR 30-59 ml/min: slightly worse than baseline. -will hold HCTZ and lisinopril -fluid resuscitation and UA  7-Diastolic heart failure: Chronic in nature with ejection fraction of 50-55%. -Strict intake and output -Daily weight -Continue beta blocker and  nitrates -given low BP and also increase sensitive loses with ongoing diarrhea will hold diuretics for now.   8-GERD: Continue pepcid  9-Respiratory failure secondary to asthma/COPD: Recent exacerbation. Patient has finished prednisone tapering and also antibiotics. Currently no wheezing and with good air movement/oxygen saturation. -Continue home inhalers  DVT: continue xarelto   Cardiology (Dr. Anne Fu)  Code Status: full Family Communication: no family at bedside Disposition Plan: admit to telemetry; observation; LOS < 2 midnights  Time spent: >30 minutes  Darreon Lutes [facility] Pager  [phone]  If 7PM-7AM, please contact night-coverage [email or web address] Password [facility] 06/26/2012, 5:03 PM

## 2012-06-26 NOTE — Consult Note (Signed)
Admit date: 06/26/2012 Referring Physician  Dr. Gwenlyn Perking Primary Physician GREEN, Lorenda Ishihara, MD Primary Cardiologist  Dr. Johney Frame (EP) Reason for Consultation  AFIB  HPI: 79 year old female with atrial fibrillation, no prior coronary artery disease with recurrent history of paroxysmal atrial fibrillation requiring DC cardioversion. She is very symptomatic with her atrial fibrillation and was placed once again on flecainide and cardioverted. anticoagulation with Xarelto. Last cardioversion took place on 06/19/12 and was successful. This was on flecainide.  Unfortunately, she comes in once again with atrial fibrillation, highly symptomatic.  She also has other symptoms such as diarrhea, generalized malaise, lightheadedness, dizziness, blurry vision, sensation of palpitations. No significant shortness of breath.  When asked if she was taking her flecainide she did not know the answer to that question. She does not believe she has been taking it.  Initially in the emergency department, her blood pressures were in the 90s. He improved with IV fluids.  PMH:   Past Medical History  Diagnosis Date  . Asthma   . Shortness of breath   . Arthritis   . Anxiety   . Hypertension   . Atrial fibrillation     a. 04/04/11  . Osteoarthritis     a. 03/28/11 - R Total Hip Arthroplasty  . Cataracts, both eyes   . Pneumonia   . Bronchitis   . Pleurisy   . Ankle swelling   . Kidney infection   . Frequent urination   . Excessive urination at night   . Chronic headaches   . Weight gain     PSH:   Past Surgical History  Procedure Laterality Date  . Cardiac catheterization      1980's or 90's - reportedly normal  . Abdominal hysterectomy  1962  . Appendectomy  1954  . Cholecystectomy    . Back surgery  1990  . Cataract extraction bilateral w/ anterior vitrectomy  2007/2008  . Total hip arthroplasty  03/28/2011    Procedure: TOTAL HIP ARTHROPLASTY;  Surgeon: Thera Flake., MD;  Location: [facility] OR;   Service: Orthopedics;  Laterality: Right;  . Cardioversion  04/05/2011    Procedure: CARDIOVERSION;  Surgeon: Gardiner Rhyme, MD;  Location: [facility] OR;  Service: Cardiovascular;  Laterality: N/A;  . Tonsillectomy  1943  . Adenoidectomy  1949  . Foot surgery  1947  . Thoracic disc surgery  2008  . Lumbar disc surgery  2010  . Knee surgery       both knees  . Shoulder surgery      left  . Total hip arthroplasty  03/28/2011    right  . Cardioversion N/A 06/19/2012    Procedure: CARDIOVERSION-bedside(3W36);  Surgeon: Lewayne Bunting, MD;  Location: [facility] OR;  Service: Cardiovascular;  Laterality: N/A;   Allergies:  Aspirin; Aspirin buf(alhyd-mghyd-cacar); Butazolidin; Celebrex; Codeine; Doripenem; Methocarbamol; Temazepam; and Vicodin Prior to Admit Meds:   Prescriptions prior to admission  Medication Sig Dispense Refill  . acetaminophen (TYLENOL) 325 MG tablet Take 650 mg by mouth 2 (two) times daily. For temp above 102      . amLODipine (NORVASC) 10 MG tablet Take 10 mg by mouth daily.      . benzonatate (TESSALON) 100 MG capsule Take 1 capsule (100 mg total) by mouth 3 (three) times daily as needed for cough.  20 capsule  0  . calcium carbonate (OS-CAL) 600 MG TABS Take 600 mg by mouth 2 (two) times daily with a meal.      . chlorzoxazone (PARAFON)  500 MG tablet Take 250 mg by mouth 3 (three) times daily as needed for muscle spasms.      . flecainide (TAMBOCOR) 50 MG tablet Take 1 tablet (50 mg total) by mouth every 12 (twelve) hours.  60 tablet  0  . Fluticasone-Salmeterol (ADVAIR) 250-50 MCG/DOSE AEPB Inhale 1 puff into the lungs at bedtime.       . Glucosamine-Chondroit-Vit C-Mn (GLUCOSAMINE CHONDR 1500 COMPLX PO) Take 1 tablet by mouth daily.      . hydrochlorothiazide (HYDRODIURIL) 25 MG tablet Take 25 mg by mouth daily.      . isosorbide-hydrALAZINE (BIDIL) 20-37.5 MG per tablet Take 1 tablet by mouth 2 (two) times daily.  30 tablet  0  . lisinopril (PRINIVIL,ZESTRIL) 40 MG tablet Take 40  mg by mouth daily.      . Multiple Vitamin (MULITIVITAMIN WITH MINERALS) TABS Take 1 tablet by mouth daily.      . Multiple Vitamins-Minerals (ICAPS PO) Take 1 tablet by mouth 2 (two) times daily.      . nebivolol (BYSTOLIC) 10 MG tablet Take 1 tablet (10 mg total) by mouth daily.  30 tablet  0  . omeprazole (PRILOSEC) 20 MG capsule Take 20 mg by mouth daily.      . Rivaroxaban (XARELTO) 20 MG TABS Take 1 tablet (20 mg total) by mouth daily.  30 tablet  0   Fam HX:    Family History  Problem Relation Age of Onset  . Diabetes Father     Father, Mother, 4 sisters (2 living)  . Heart attack Father     (Deceased)  . Heart failure Father     (deceased 88)  . Stroke Mother     (deceased 52)  . Cancer Sister     (deceased)  . Hypertension Father   . Hypertension Mother   . Heart attack Sister   . Diabetes Sister    Social HX:    History   Social History  . Marital Status: Widowed    Spouse Name: N/A    Number of Children: N/A  . Years of Education: N/A   Occupational History  . Retired    Social History Main Topics  . Smoking status: Former Smoker -- 21 years    Types: Cigarettes    Quit date: 03/17/1970  . Smokeless tobacco: Never Used  . Alcohol Use: No  . Drug Use: No  . Sexually Active: Not Currently   Other Topics Concern  . Not on file   Social History Narrative   Lives alone in [place].  Widowed in 2012 (husband w/ dementia & parkinsons)..   Not active, does not drive due to vision problems.     ROS:  All 11 ROS were addressed and are negative except what is stated in the HPI  Physical Exam: Blood pressure 167/78, pulse 119, temperature 97.7 F (36.5 C), temperature source Oral, resp. rate 19, SpO2 97.00%.    General: Well developed, well nourished, in no acute distress Head: Eyes PERRLA, No xanthomas.   Normal cephalic and atramatic  Lungs:   Clear bilaterally to auscultation and percussion. Normal respiratory effort. No wheezes, no rales. Heart:   Irregularly irregular, tachycardic Pulses are 2+ & equal.            No carotid bruit. No JVD.  No abdominal bruits. No femoral bruits. Abdomen: Bowel sounds are positive, abdomen soft and non-tender without masses. No hepatosplenomegaly. Obese. Msk:  Back normal. Normal strength and tone for age.  Extremities:   No clubbing, cyanosis, 1+ chronic edema.  DP +1 Neuro: Alert and oriented X 3, non-focal, MAE x 4 GU: Deferred Rectal: Deferred Psych:  Good affect, responds appropriately    Labs:   Lab Results  Component Value Date   WBC 16.9* 06/26/2012   HGB 11.5* 06/26/2012   HCT 35.4* 06/26/2012   MCV 91.9 06/26/2012   PLT 276 06/26/2012    Recent Labs Lab 06/26/12 1249  NA 138  K 5.1  CL 98  CO2 32  BUN 50*  CREATININE 1.38*  CALCIUM 9.4  PROT 6.0  BILITOT 0.5  ALKPHOS 54  ALT 24  AST 17  GLUCOSE 116*   No results found for this basename: PTT   Lab Results  Component Value Date   INR 1.07 04/04/2011   INR 0.97 03/18/2011   Lab Results  Component Value Date   CKTOTAL 59 04/05/2011   CKMB 2.0 04/05/2011   TROPONINI <0.30 06/26/2012     Lab Results  Component Value Date   CHOL 204* 04/05/2011   Lab Results  Component Value Date   HDL 26* 04/05/2011   Lab Results  Component Value Date   LDLCALC 136* 04/05/2011   Lab Results  Component Value Date   TRIG 211* 04/05/2011   Lab Results  Component Value Date   CHOLHDL 7.8 04/05/2011   No results found for this basename: LDLDIRECT       Radiology:  Dg Chest Port 1 View  06/26/2012   *RADIOLOGY REPORT*  Clinical Data: Shortness of breath.  Wheezing.  Current history of hypertension.  PORTABLE CHEST - 1 VIEW 06/26/2012 1132 hours:  Comparison: Portable chest x-ray 06/18/2012, 06/14/2012, 03/28/2011 and two-view chest x-ray 04/04/2011.  Findings: Cardiac silhouette normal in size for the AP portable technique.  Thoracic aorta atherosclerotic, unchanged.  Hilar and mediastinal contours otherwise unremarkable.  Lungs clear.  Bronchovascular markings normal.  Pulmonary vascularity normal.  No pneumothorax.  No pleural effusions.  IMPRESSION: No acute cardiopulmonary disease.   Original Report Authenticated By: Hulan Saas, M.D.   Personally viewed.  EKG:  Atrial fibrillation, heart rate 117 beats per minute. And her Personally viewed.   ECHO:05/07/12 - Mildly dilated LV with low nomal to mildly reduced systolic function, EF 50-55%. Moderate diastolic dysfunction. Normal RV size and systolic function. No significant valvular abnormalities.   ASSESSMENT/PLAN:   79 year old with paroxysmal, symptomatic atrial fibrillation, most recent cardioversion approximately one week ago with anticoagulation, antiarrhythmic, flecainide.   - Will discuss with EP, Dr. Johney Frame.  - Question possible attempt at ablative therapy vs. Initiation of amiodarone (improved efficacy). Challenging situation.  - I do not believe that she was taking her flecainide at home as was prescribed on discharge summary however she is unsure. Compliance may be an issue. She is sure that she has been taking her anticoagulation. She thinks that they only gave her the flecainide prior to the cardioversion and since he did not chemically convert her it was stopped. I reviewed discharge summary which has flecainide 50 mg twice a day on it.   - Appreciate hospitalist workup of other reasons for generalized weakness, diarrhea.   - For now, I agree with continuation of flecainide (or perhaps restarting) unless Dr. Johney Frame thinks otherwise.  Donato Schultz, MD  06/26/2012  5:43 PM

## 2012-06-26 NOTE — ED Notes (Signed)
Admitting MD at bedside.

## 2012-06-26 NOTE — ED Provider Notes (Signed)
History     CSN: [id number]  Arrival date & time 06/26/12  1041   First MD Initiated Contact with Patient 06/26/12 1100      Chief Complaint  Patient presents with  . Shortness of Breath  . Dizziness    (Consider location/radiation/quality/duration/timing/severity/associated sxs/prior treatment) HPI Pt with chest fluttering that started this morning. No dizziness, CP. Pt has baseline SOB which is not worsened. No fever or chills. Pt recently admitted with Afib and cardioverted. No lower ext swelling or pain.  Past Medical History  Diagnosis Date  . Asthma   . Shortness of breath   . Arthritis   . Anxiety   . Hypertension   . Atrial fibrillation     a. 04/04/11  . Osteoarthritis     a. 03/28/11 - R Total Hip Arthroplasty  . Cataracts, both eyes   . Pneumonia   . Bronchitis   . Pleurisy   . Ankle swelling   . Kidney infection   . Frequent urination   . Excessive urination at night   . Chronic headaches   . Weight gain     Past Surgical History  Procedure Laterality Date  . Cardiac catheterization      1980's or 90's - reportedly normal  . Abdominal hysterectomy  1962  . Appendectomy  1954  . Cholecystectomy    . Back surgery  1990  . Cataract extraction bilateral w/ anterior vitrectomy  2007/2008  . Total hip arthroplasty  03/28/2011    Procedure: TOTAL HIP ARTHROPLASTY;  Surgeon: Thera Flake., MD;  Location: [facility] OR;  Service: Orthopedics;  Laterality: Right;  . Cardioversion  04/05/2011    Procedure: CARDIOVERSION;  Surgeon: Gardiner Rhyme, MD;  Location: [facility] OR;  Service: Cardiovascular;  Laterality: N/A;  . Tonsillectomy  1943  . Adenoidectomy  1949  . Foot surgery  1947  . Thoracic disc surgery  2008  . Lumbar disc surgery  2010  . Knee surgery       both knees  . Shoulder surgery      left  . Total hip arthroplasty  03/28/2011    right  . Cardioversion N/A 06/19/2012    Procedure: CARDIOVERSION-bedside(3W36);  Surgeon: Lewayne Bunting, MD;  Location: [facility]  OR;  Service: Cardiovascular;  Laterality: N/A;    Family History  Problem Relation Age of Onset  . Diabetes Father     Father, Mother, 4 sisters (2 living)  . Heart attack Father     (Deceased)  . Heart failure Father     (deceased 74)  . Stroke Mother     (deceased 49)  . Cancer Sister     (deceased)  . Hypertension Father   . Hypertension Mother   . Heart attack Sister   . Diabetes Sister     History  Substance Use Topics  . Smoking status: Former Smoker -- 21 years    Types: Cigarettes    Quit date: 03/17/1970  . Smokeless tobacco: Never Used  . Alcohol Use: No    OB History   Grav Para Term Preterm Abortions TAB SAB Ect Mult Living                  Review of Systems  Constitutional: Negative for fever and chills.  Respiratory: Positive for shortness of breath and wheezing. Negative for cough.   Cardiovascular: Positive for palpitations. Negative for chest pain and leg swelling.  Gastrointestinal: Negative for nausea, vomiting and abdominal pain.  Genitourinary:  Negative for dysuria.  Musculoskeletal: Negative for back pain.  Skin: Negative for rash and wound.  Neurological: Negative for dizziness, weakness, light-headedness, numbness and headaches.  All other systems reviewed and are negative.    Allergies  Aspirin; Aspirin buf(alhyd-mghyd-cacar); Butazolidin; Celebrex; Codeine; Doripenem; Methocarbamol; Temazepam; and Vicodin  Home Medications   Current Outpatient Rx  Name  Route  Sig  Dispense  Refill  . acetaminophen (TYLENOL) 325 MG tablet   Oral   Take 650 mg by mouth 2 (two) times daily. For temp above 102         . amLODipine (NORVASC) 10 MG tablet   Oral   Take 10 mg by mouth daily.         . benzonatate (TESSALON) 100 MG capsule   Oral   Take 1 capsule (100 mg total) by mouth 3 (three) times daily as needed for cough.   20 capsule   0   . calcium carbonate (OS-CAL) 600 MG TABS   Oral   Take 600 mg by mouth 2 (two) times daily  with a meal.         . chlorzoxazone (PARAFON) 500 MG tablet   Oral   Take 250 mg by mouth 3 (three) times daily as needed for muscle spasms.         . flecainide (TAMBOCOR) 50 MG tablet   Oral   Take 1 tablet (50 mg total) by mouth every 12 (twelve) hours.   60 tablet   0   . Fluticasone-Salmeterol (ADVAIR) 250-50 MCG/DOSE AEPB   Inhalation   Inhale 1 puff into the lungs at bedtime.          . Glucosamine-Chondroit-Vit C-Mn (GLUCOSAMINE CHONDR 1500 COMPLX PO)   Oral   Take 1 tablet by mouth daily.         . hydrochlorothiazide (HYDRODIURIL) 25 MG tablet   Oral   Take 25 mg by mouth daily.         . isosorbide-hydrALAZINE (BIDIL) 20-37.5 MG per tablet   Oral   Take 1 tablet by mouth 2 (two) times daily.   30 tablet   0   . lisinopril (PRINIVIL,ZESTRIL) 40 MG tablet   Oral   Take 40 mg by mouth daily.         . Multiple Vitamin (MULITIVITAMIN WITH MINERALS) TABS   Oral   Take 1 tablet by mouth daily.         . Multiple Vitamins-Minerals (ICAPS PO)   Oral   Take 1 tablet by mouth 2 (two) times daily.         . nebivolol (BYSTOLIC) 10 MG tablet   Oral   Take 1 tablet (10 mg total) by mouth daily.   30 tablet   0   . omeprazole (PRILOSEC) 20 MG capsule   Oral   Take 20 mg by mouth daily.         . Rivaroxaban (XARELTO) 20 MG TABS   Oral   Take 1 tablet (20 mg total) by mouth daily.   30 tablet   0     BP 146/56  Pulse 111  Temp(Src) 97.7 F (36.5 C) (Oral)  Resp 20  SpO2 98%  Physical Exam  Nursing note and vitals reviewed. Constitutional: She is oriented to person, place, and time. She appears well-developed and well-nourished. No distress.  HENT:  Head: Normocephalic and atraumatic.  Mouth/Throat: Oropharynx is clear and moist.  Eyes: EOM are normal. Pupils are equal, round, and  reactive to light.  Neck: Normal range of motion. Neck supple.  Cardiovascular: Exam reveals no gallop and no friction rub.   No murmur heard. Tachy  irreg, irreg  Pulmonary/Chest: Effort normal. No respiratory distress. She has wheezes. She has no rales.  Abdominal: Soft. Bowel sounds are normal. She exhibits no distension and no mass. There is no tenderness. There is no rebound and no guarding.  Musculoskeletal: Normal range of motion. She exhibits no edema and no tenderness.  No calf swelling or pain  Neurological: She is alert and oriented to person, place, and time.  Moves all ext, sensation intact.   Skin: Skin is warm and dry. No rash noted. No erythema.  Psychiatric: She has a normal mood and affect. Her behavior is normal.    ED Course  Procedures (including critical care time)  Labs Reviewed  CBC WITH DIFFERENTIAL - Abnormal; Notable for the following:    WBC 16.9 (*)    RBC 3.85 (*)    Hemoglobin 11.5 (*)    HCT 35.4 (*)    Neutrophils Relative % 88 (*)    Neutro Abs 14.9 (*)    Lymphocytes Relative 8 (*)    All other components within normal limits  COMPREHENSIVE METABOLIC PANEL - Abnormal; Notable for the following:    Glucose, Bld 116 (*)    BUN 50 (*)    Creatinine, Ser 1.38 (*)    Albumin 3.4 (*)    GFR calc non Af Amer 36 (*)    GFR calc Af Amer 42 (*)    All other components within normal limits  CLOSTRIDIUM DIFFICILE BY PCR  PRO B NATRIURETIC PEPTIDE  TROPONIN I  URINALYSIS, ROUTINE W REFLEX MICROSCOPIC   Dg Chest Port 1 View  06/26/2012   *RADIOLOGY REPORT*  Clinical Data: Shortness of breath.  Wheezing.  Current history of hypertension.  PORTABLE CHEST - 1 VIEW 06/26/2012 1132 hours:  Comparison: Portable chest x-ray 06/18/2012, 06/14/2012, 03/28/2011 and two-view chest x-ray 04/04/2011.  Findings: Cardiac silhouette normal in size for the AP portable technique.  Thoracic aorta atherosclerotic, unchanged.  Hilar and mediastinal contours otherwise unremarkable.  Lungs clear. Bronchovascular markings normal.  Pulmonary vascularity normal.  No pneumothorax.  No pleural effusions.  IMPRESSION: No acute  cardiopulmonary disease.   Original Report Authenticated By: Hulan Saas, M.D.     1. Paroxysmal a-fib   2. Asthma exacerbation   3. Dizziness   4. Diarrhea      Date: 06/26/2012  Rate: 117  Rhythm: atrial fibrillation  QRS Axis: normal  Intervals: normal  ST/T Wave abnormalities: normal  Conduction Disutrbances:none  Narrative Interpretation:   Old EKG Reviewed: changes noted    MDM   Pt with improved HR. Will give low dose breathing treatment. Pt admits to multiple episodes of diarrhea x past 2 days. Will admit to [facility].   Dr Gwenlyn Perking to admit to observation     Loren Racer, MD 06/26/12 1622

## 2012-06-27 DIAGNOSIS — F411 Generalized anxiety disorder: Secondary | ICD-10-CM

## 2012-06-27 DIAGNOSIS — J45901 Unspecified asthma with (acute) exacerbation: Secondary | ICD-10-CM

## 2012-06-27 LAB — BASIC METABOLIC PANEL
Calcium: 8.7 mg/dL (ref 8.4–10.5)
GFR calc Af Amer: 52 mL/min — ABNORMAL LOW (ref >90)
GFR calc non Af Amer: 45 mL/min — ABNORMAL LOW (ref >90)
Potassium: 4.3 mEq/L (ref 3.5–5.1)
Sodium: 137 mEq/L (ref 135–145)

## 2012-06-27 LAB — CBC
MCH: 29.5 pg (ref 26.0–34.0)
Platelets: 255 10*3/uL (ref 150–400)
RBC: 3.76 MIL/uL — ABNORMAL LOW (ref 3.87–5.11)
WBC: 10.8 10*3/uL — ABNORMAL HIGH (ref 4.0–10.5)

## 2012-06-27 LAB — VITAMIN B12: Vitamin B-12: 733 pg/mL (ref 211–911)

## 2012-06-27 LAB — TSH: TSH: 0.435 u[IU]/mL (ref 0.350–4.500)

## 2012-06-27 LAB — TROPONIN I
Troponin I: <0.3 ng/mL (ref <0.30)
Troponin I: <0.3 ng/mL (ref <0.30)

## 2012-06-27 LAB — CLOSTRIDIUM DIFFICILE BY PCR: Toxigenic C. Difficile by PCR: NEGATIVE

## 2012-06-27 NOTE — Evaluation (Signed)
Physical Therapy Evaluation Patient Details Name: Jordan Byrd MRN: [id number] DOB: March 12, 1933 Today's Date: 06/27/2012 Time: 5784-6962 PT Time Calculation (min): 25 min  PT Assessment / Plan / Recommendation Clinical Impression  Patient is a 79 yo female admitted with symptomatic PAF, and h/o COPD.  Patient able to ambulate 150' with RW on O2 at 3 l/min with O2 sat at 97%.  Will benefit from acute PT to maximize independence prior to return home alone.     PT Assessment  Patient needs continued PT services    Follow Up Recommendations  No PT follow up;Supervision - Intermittent    Does the patient have the potential to tolerate intense rehabilitation      Barriers to Discharge Decreased caregiver support      Equipment Recommendations  None recommended by PT    Recommendations for Other Services     Frequency Min 3X/week    Precautions / Restrictions Precautions Precautions: None Restrictions Weight Bearing Restrictions: No   Pertinent Vitals/Pain       Mobility  Bed Mobility Bed Mobility: Supine to Sit;Sitting - Scoot to Edge of Bed Supine to Sit: 6: Modified independent (Device/Increase time);With rails Sitting - Scoot to Edge of Bed: 6: Modified independent (Device/Increase time);With rail Details for Bed Mobility Assistance: No cues or assist needed Transfers Transfers: Sit to Stand;Stand to Sit Sit to Stand: 6: Modified independent (Device/Increase time);With upper extremity assist;From bed Stand to Sit: 6: Modified independent (Device/Increase time);With upper extremity assist;To bed Details for Transfer Assistance: Cues for hand placement Ambulation/Gait Ambulation/Gait Assistance: 5: Supervision Ambulation Distance (Feet): 150 Feet Assistive device: Rolling walker Ambulation/Gait Assistance Details: Verbal cues to stay close to RW.  Ambulated on 3 L O2. Gait Pattern: Step-through pattern;Decreased stride length Gait velocity: Slow to minimize  dyspnea General Gait Details: Patient with dyspnea 3/4, and O2 sats at 97% with ambulation on O2 at 3 l/min.    Exercises     PT Diagnosis: Difficulty walking  PT Problem List: Decreased strength;Decreased activity tolerance;Decreased balance;Decreased mobility;Cardiopulmonary status limiting activity PT Treatment Interventions: DME instruction;Gait training;Therapeutic exercise;Patient/family education   PT Goals Acute Rehab PT Goals PT Goal Formulation: With patient Time For Goal Achievement: 07/04/12 Potential to Achieve Goals: Good Pt will Ambulate: 51 - 150 feet;with modified independence;with least restrictive assistive device (with dyspnea at or below 2/4) PT Goal: Ambulate - Progress: Goal set today  Visit Information  Last PT Received On: 06/27/12 Assistance Needed: +1    Subjective Data  Subjective: "I was sick yesterday, but I feel better today. Patient Stated Goal: To go home   Prior Functioning  Home Living Lives With: Alone Available Help at Discharge: Family;Available PRN/intermittently Type of Home: House Home Access: Ramped entrance Home Layout: One level Bathroom Shower/Tub: Other (comment) (Takes sponge baths) Bathroom Toilet: Standard Bathroom Accessibility: Yes How Accessible: Accessible via walker Home Adaptive Equipment: Walker - rolling;Straight cane;Bedside commode/3-in-1 Prior Function Level of Independence: Independent with assistive device(s);Needs assistance (Occasionally uses cane) Needs Assistance: Bathing;Meal Prep;Light Housekeeping Bath: Minimal Meal Prep: Minimal Light Housekeeping: Maximal Able to Take Stairs?: No Driving: Yes Vocation: Retired Musician: No difficulties    Copywriter, advertising Arousal/Alertness: Awake/alert Behavior During Therapy: WFL for tasks assessed/performed Overall Cognitive Status: Within Functional Limits for tasks assessed    Extremity/Trunk Assessment Right Upper Extremity  Assessment RUE ROM/Strength/Tone: WFL for tasks assessed RUE Sensation: WFL - Light Touch Left Upper Extremity Assessment LUE ROM/Strength/Tone: WFL for tasks assessed LUE Sensation: WFL - Light Touch Right Lower  Extremity Assessment RLE ROM/Strength/Tone: Deficits RLE ROM/Strength/Tone Deficits: Strength 4/5 RLE Sensation: WFL - Light Touch Left Lower Extremity Assessment LLE ROM/Strength/Tone: Deficits LLE ROM/Strength/Tone Deficits: Strength 4/5 LLE Sensation: WFL - Light Touch   Balance    End of Session PT - End of Session Equipment Utilized During Treatment: Gait belt;Oxygen Activity Tolerance: Patient limited by fatigue (Dyspnea 3/4 after ambulation) Patient left: in bed;with call bell/phone within reach (sitting at EOB) Nurse Communication: Mobility status (O2 sats)  GP Functional Assessment Tool Used: Clinical judgement Functional Limitation: Mobility: Walking and moving around Mobility: Walking and Moving Around Current Status ([id number]): At least 1 percent but less than 20 percent impaired, limited or restricted Mobility: Walking and Moving Around Goal Status [phone]): 0 percent impaired, limited or restricted   Vena Austria 06/27/2012, 5:08 PM Durenda Hurt. Renaldo Fiddler, [facility] Acute Rehab Services Pager [phone]

## 2012-06-27 NOTE — Progress Notes (Signed)
Pt c/o of upper cervical area. . Pt rates it at 4/10. Sharp pain when she moves her neck. Pt thinks it is from her previous neck surgery. Refuses to take pain med. Will refer accordingly. Thanks   Ancil Linsey RN

## 2012-06-27 NOTE — Progress Notes (Signed)
[facility]                                                                                Patient Demographics  Jordan Byrd, is a 79 y.o. female, DOB - May 17, 1933, ZOX:[id number], WJX:[id number]  Admit date - 06/26/2012  Admitting Physician No admitting provider for patient encounter.  Outpatient Primary MD for the patient is GREEN, Lorenda Ishihara, MD  LOS - 1   Chief Complaint  Patient presents with  . Shortness of Breath  . Dizziness        Assessment & Plan    1.  Paroxysmal atrial fibrillation - Has old history of atrial fibrillation, cardiology following the patient, then increase to flatten night, continue xaralto and telemetry monitoring, she is symptom free now with no dizziness or palpitations.Systolic will be continued also.   2.Dizziness. Likely due to atrial fibrillation, head CT unremarkable she is not orthostatic, Cycle troponins are negative,will increase activity request PT to see the patient and monitor.    3.Chronic kidney disease stage IV. Creatinine at baseline continue to monitor.Mild acute insufficiency has resolved after ACE inhibitor and HCTZ were held.    4. Chronic diastolic CHF  she is compensated,EF 50% on last echo gram April 2013,She remains compensated. Monitor clinically.Fluid and salt restriction.   5.Hypertension. Stable on beta blocker, Norvasc and bidil which will be continued .    6.History of diarrhea. No diarrhea free.C. difficile PCR is negative.   7.Recent COPD exacerbation has resolved, continue when necessary nebulizers along with oxygen, she uses oxygen at home.   Code Status: Full  Family Communication: none present  Disposition Plan: Home   Procedures     Consults Cards   DVT Prophylaxis  Xaralto  Lab Results  Component Value Date   PLT 255 06/27/2012    Medications  Scheduled Meds: . sodium chloride   Intravenous STAT  . calcium carbonate  1 tablet Oral BID WC  . famotidine  40 mg Oral Daily  .  flecainide  50 mg Oral Q12H  . isosorbide-hydrALAZINE  1 tablet Oral BID  . mometasone-formoterol  2 puff Inhalation BID  . multivitamin with minerals  1 tablet Oral Daily  . multivitamin-lutein  1 capsule Oral BID  . nebivolol  10 mg Oral Daily  . Rivaroxaban  20 mg Oral Daily  . sodium chloride  3 mL Intravenous Q12H   Continuous Infusions:  PRN Meds:.acetaminophen, acetaminophen, benzonatate, chlorzoxazone, levalbuterol, meclizine, ondansetron (ZOFRAN) IV, ondansetron  Antibiotics    Anti-infectives   None       Time Spent in minutes   35   Minola Guin K M.D on 06/27/2012 at 10:41 AM  Between 7am to 7pm - Pager - [phone]  After 7pm go to [email or web address] - password [email or web address]  And look for the night coverage person covering for me after hours  [facility] Office  [phone]    Subjective:   Jordan Byrd today has, No headache, No chest pain, No abdominal pain - No Nausea, No new weakness tingling or numbness, No Cough - SOB.  Objective:   Filed Vitals:   06/27/12 0601 06/27/12 0603 06/27/12 0606 06/27/12 0839  BP: 153/65 111/79 150/73  Pulse: 94 95 113   Temp:      TempSrc:      Resp:      Height:      Weight:   111.131 kg (245 lb)   SpO2:    99%    Wt Readings from Last 3 Encounters:  06/27/12 111.131 kg (245 lb)  06/19/12 115.849 kg (255 lb 6.4 oz)  06/19/12 115.849 kg (255 lb 6.4 oz)    No intake or output data in the 24 hours ending 06/27/12 1041  Exam Awake Alert, Oriented X 3, No new F.N deficits, Normal affect Mar-Mac.AT,PERRAL Supple Neck,No JVD, No cervical lymphadenopathy appriciated.  Symmetrical Chest wall movement, Good air movement bilaterally, CTAB RRR,No Gallops,Rubs or new Murmurs, No Parasternal Heave +ve B.Sounds, Abd Soft, Non tender, No organomegaly appriciated, No rebound - guarding or rigidity. No Cyanosis, Clubbing or edema, No new Rash or bruise   Data Review   Micro Results Recent Results (from the past 240  hour(s))  CLOSTRIDIUM DIFFICILE BY PCR     Status: None   Collection Time    06/26/12  7:29 PM      Result Value Range Status   C difficile by pcr NEGATIVE  NEGATIVE Final    Radiology Reports Ct Head Wo Contrast  06/26/2012   *RADIOLOGY REPORT*  Clinical Data: Dizziness, ataxia  CT HEAD WITHOUT CONTRAST  Technique:  Contiguous axial images were obtained from the base of the skull through the vertex without contrast.  Comparison: MRI brain dated 04/16/2007  Findings: No evidence of parenchymal hemorrhage or extra-axial fluid collection. No mass lesion, mass effect, or midline shift.  No CT evidence of acute infarction.  Subcortical white matter and periventricular small vessel ischemic changes.  Mild intracranial atherosclerosis.  Cerebral volume is age appropriate.  No ventriculomegaly.  Mucous retention cyst in the left sphenoid sinus.  Partial opacification of the right mastoid air cells, chronic.  No evidence of calvarial fracture.  IMPRESSION: No evidence of acute intracranial abnormality.  Small vessel ischemic changes with intracranial atherosclerosis.   Original Report Authenticated By: Charline Bills, M.D.   Dg Chest Port 1 View  06/26/2012   *RADIOLOGY REPORT*  Clinical Data: Shortness of breath.  Wheezing.  Current history of hypertension.  PORTABLE CHEST - 1 VIEW 06/26/2012 1132 hours:  Comparison: Portable chest x-ray 06/18/2012, 06/14/2012, 03/28/2011 and two-view chest x-ray 04/04/2011.  Findings: Cardiac silhouette normal in size for the AP portable technique.  Thoracic aorta atherosclerotic, unchanged.  Hilar and mediastinal contours otherwise unremarkable.  Lungs clear. Bronchovascular markings normal.  Pulmonary vascularity normal.  No pneumothorax.  No pleural effusions.  IMPRESSION: No acute cardiopulmonary disease.   Original Report Authenticated By: Hulan Saas, M.D.   Dg Chest Port 1 View  06/18/2012   *RADIOLOGY REPORT*  Clinical Data: Shortness of breath.  Mid chest  pain.  Productive cough.  PORTABLE CHEST - 1 VIEW  Comparison: 06/14/2012  Findings: Heart size and pulmonary vascularity are normal and the lungs are clear.  No acute osseous abnormality.  IMPRESSION: No acute disease.   Original Report Authenticated By: Francene Boyers, M.D.   Dg Chest Portable 1 View  06/14/2012   *RADIOLOGY REPORT*  Clinical Data: Shortness of breath  PORTABLE CHEST - 1 VIEW  Comparison: 04/04/2011  Findings: Aortic atherosclerosis.  Cardiomediastinal contours otherwise within normal range.  Mild lung base atelectasis or scarring.  Otherwise, no confluent airspace opacity, pleural effusion, or pneumothorax.  Cervical fusion hardware is partially imaged.  No acute osseous  finding.  IMPRESSION: Mild lung base opacities, favor atelectasis or scarring.   Original Report Authenticated By: Jearld Lesch, M.D.    CBC  Recent Labs Lab 06/26/12 1249 06/27/12 0525  WBC 16.9* 10.8*  HGB 11.5* 11.1*  HCT 35.4* 34.5*  PLT 276 255  MCV 91.9 91.8  MCH 29.9 29.5  MCHC 32.5 32.2  RDW 15.0 14.9  LYMPHSABS 1.3  --   MONOABS 0.6  --   EOSABS 0.0  --   BASOSABS 0.0  --     Chemistries   Recent Labs Lab 06/26/12 1249 06/27/12 0525  NA 138 137  K 5.1 4.3  CL 98 100  CO2 32 30  GLUCOSE 116* 92  BUN 50* 39*  CREATININE 1.38* 1.16*  CALCIUM 9.4 8.7  AST 17  --   ALT 24  --   ALKPHOS 54  --   BILITOT 0.5  --    ------------------------------------------------------------------------------------------------------------------ estimated creatinine clearance is 52.1 ml/min (by C-G formula based on Cr of 1.16). ------------------------------------------------------------------------------------------------------------------ No results found for this basename: HGBA1C,  in the last 72 hours ------------------------------------------------------------------------------------------------------------------ No results found for this basename: CHOL, HDL, LDLCALC, TRIG, CHOLHDL,  LDLDIRECT,  in the last 72 hours ------------------------------------------------------------------------------------------------------------------ No results found for this basename: TSH, T4TOTAL, FREET3, T3FREE, THYROIDAB,  in the last 72 hours ------------------------------------------------------------------------------------------------------------------ No results found for this basename: VITAMINB12, FOLATE, FERRITIN, TIBC, IRON, RETICCTPCT,  in the last 72 hours  Coagulation profile No results found for this basename: INR, PROTIME,  in the last 168 hours  No results found for this basename: DDIMER,  in the last 72 hours  Cardiac Enzymes  Recent Labs Lab 06/26/12 1829 06/26/12 2350 06/27/12 0525  TROPONINI <0.30 <0.30 <0.30   ------------------------------------------------------------------------------------------------------------------ No components found with this basename: POCBNP,

## 2012-06-27 NOTE — Progress Notes (Signed)
Patient ID: Jordan Byrd, female   DOB: 05/21/1933, 79 y.o.   MRN: [id number]    Subjective:  Denies SSCP, palpitations or Dyspnea Feels less weak  Objective:  Filed Vitals:   06/27/12 0600 06/27/12 0601 06/27/12 0603 06/27/12 0606  BP: 103/66 153/65 111/79 150/73  Pulse: 94 94 95 113  Temp: 97.8 F (36.6 C)     TempSrc: Oral     Resp: 18     Height:      Weight:    245 lb (111.131 kg)  SpO2: 98%       Intake/Output from previous day: No intake or output data in the 24 hours ending 06/27/12 0744  Physical Exam: Affect appropriate Obese white female HEENT: normal Neck supple with no adenopathy JVP normal no bruits no thyromegaly Lungs clear with no wheezing and good diaphragmatic motion Heart:  S1/S2 no murmur, no rub, gallop or click PMI normal Abdomen: benighn, BS positve, no tenderness, no AAA no bruit.  No HSM or HJR Distal pulses intact with no bruits No edema Neuro non-focal Skin warm and dry No muscular weakness   Lab Results: Basic Metabolic Panel:  Recent Labs  40/98/11 1249 06/27/12 0525  NA 138 137  K 5.1 4.3  CL 98 100  CO2 32 30  GLUCOSE 116* 92  BUN 50* 39*  CREATININE 1.38* 1.16*  CALCIUM 9.4 8.7   Liver Function Tests:  Recent Labs  06/26/12 1249  AST 17  ALT 24  ALKPHOS 54  BILITOT 0.5  PROT 6.0  ALBUMIN 3.4*   No results found for this basename: LIPASE, AMYLASE,  in the last 72 hours CBC:  Recent Labs  06/26/12 1249 06/27/12 0525  WBC 16.9* 10.8*  NEUTROABS 14.9*  --   HGB 11.5* 11.1*  HCT 35.4* 34.5*  MCV 91.9 91.8  PLT 276 255   Cardiac Enzymes:  Recent Labs  06/26/12 1829 06/26/12 2350 06/27/12 0525  TROPONINI <0.30 <0.30 <0.30    Imaging: Ct Head Wo Contrast  06/26/2012   *RADIOLOGY REPORT*  Clinical Data: Dizziness, ataxia  CT HEAD WITHOUT CONTRAST  Technique:  Contiguous axial images were obtained from the base of the skull through the vertex without contrast.  Comparison: MRI brain dated  04/16/2007  Findings: No evidence of parenchymal hemorrhage or extra-axial fluid collection. No mass lesion, mass effect, or midline shift.  No CT evidence of acute infarction.  Subcortical white matter and periventricular small vessel ischemic changes.  Mild intracranial atherosclerosis.  Cerebral volume is age appropriate.  No ventriculomegaly.  Mucous retention cyst in the left sphenoid sinus.  Partial opacification of the right mastoid air cells, chronic.  No evidence of calvarial fracture.  IMPRESSION: No evidence of acute intracranial abnormality.  Small vessel ischemic changes with intracranial atherosclerosis.   Original Report Authenticated By: Charline Bills, M.D.   Dg Chest Port 1 View  06/26/2012   *RADIOLOGY REPORT*  Clinical Data: Shortness of breath.  Wheezing.  Current history of hypertension.  PORTABLE CHEST - 1 VIEW 06/26/2012 1132 hours:  Comparison: Portable chest x-ray 06/18/2012, 06/14/2012, 03/28/2011 and two-view chest x-ray 04/04/2011.  Findings: Cardiac silhouette normal in size for the AP portable technique.  Thoracic aorta atherosclerotic, unchanged.  Hilar and mediastinal contours otherwise unremarkable.  Lungs clear. Bronchovascular markings normal.  Pulmonary vascularity normal.  No pneumothorax.  No pleural effusions.  IMPRESSION: No acute cardiopulmonary disease.   Original Report Authenticated By: Hulan Saas, M.D.    Cardiac Studies:  ECG:  afib flutter  rate 83 no ischemic changes   Telemetry:  afib no VT 06/27/2012   Echo: 4/13  EF 50-55% mild LAE  Medications:   . sodium chloride   Intravenous STAT  . calcium carbonate  1 tablet Oral BID WC  . famotidine  40 mg Oral Daily  . flecainide  50 mg Oral Q12H  . isosorbide-hydrALAZINE  1 tablet Oral BID  . mometasone-formoterol  2 puff Inhalation BID  . multivitamin with minerals  1 tablet Oral Daily  . multivitamin-lutein  1 capsule Oral BID  . nebivolol  10 mg Oral Daily  . Rivaroxaban  20 mg Oral Daily    . sodium chloride  3 mL Intravenous Q12H       Assessment/Plan:  PAF:  Post recent cardioversion Not taking flecainide regularly Will increase dose to 100 bid.  Continue xarelto Probably ok to discharge in am with outpatient f/u Dr Johney Frame.  Plan repeat DCC in 2-3 weeks after sufficient  Flecainide on board Dehydration:  Improved BUN down feels less weak  Charlton Haws 06/27/2012, 7:44 AM

## 2012-06-28 MED ORDER — FLECAINIDE ACETATE 100 MG PO TABS
100.0000 mg | ORAL_TABLET | Freq: Two times a day (BID) | ORAL | Status: DC
  Administered 2012-06-28: 100 mg via ORAL
  Filled 2012-06-28 (×2): qty 1

## 2012-06-28 MED ORDER — NEBIVOLOL HCL 20 MG PO TABS: 20.0000 mg | ORAL_TABLET | Freq: Every day | ORAL | Status: DC

## 2012-06-28 MED ORDER — FLECAINIDE ACETATE 100 MG PO TABS: 100.0000 mg | ORAL_TABLET | Freq: Two times a day (BID) | ORAL | Status: DC

## 2012-06-28 MED ORDER — NEBIVOLOL HCL 10 MG PO TABS
20.0000 mg | ORAL_TABLET | Freq: Every day | ORAL | Status: DC
  Administered 2012-06-28: 20 mg via ORAL
  Filled 2012-06-28: qty 2

## 2012-06-28 MED ORDER — RIVAROXABAN 15 MG PO TABS
15.0000 mg | ORAL_TABLET | Freq: Every day | ORAL | Status: DC
  Filled 2012-06-28: qty 1

## 2012-06-28 NOTE — Progress Notes (Addendum)
Patient Name: Jordan Byrd      SUBJECTIVE: feels much better than when she got here  Past Medical History  Diagnosis Date  . Asthma   . Shortness of breath   . Arthritis   . Anxiety   . Hypertension   . Atrial fibrillation     a. 04/04/11  . Osteoarthritis     a. 03/28/11 - R Total Hip Arthroplasty  . Cataracts, both eyes   . Pneumonia   . Bronchitis   . Pleurisy   . Ankle swelling   . Kidney infection   . Frequent urination   . Excessive urination at night   . Chronic headaches   . Weight gain     PHYSICAL EXAM Filed Vitals:   06/27/12 1956 06/27/12 2110 06/28/12 0539 06/28/12 0654  BP:  103/51 122/52   Pulse:  85 112 86  Temp:  97.7 F (36.5 C) 98.1 F (36.7 C)   TempSrc:  Oral Oral   Resp:  20 20   Height:      Weight:   248 lb 0.3 oz (112.5 kg)   SpO2: 98% 97% 100%     Well developed and nourished obese female n no acute distress HENT normal Neck supple   Carotids brisk and full without bruits Clear Irregularly irregular rate and rhythm with controlled ventricular response, no murmurs or gallops Abd-soft with active BS without hepatomegaly No Clubbing cyanosis edema Skin-warm and dry A & Oriented  Grossly normal sensory and motor function  TELEMETRY: Reviewed telemetry pt in *afib controlled ventricular response    Intake/Output Summary (Last 24 hours) at 06/28/12 0820 Last data filed at 06/28/12 0542  Gross per 24 hour  Intake    840 ml  Output    700 ml  Net    140 ml    LABS: Basic Metabolic Panel:  Recent Labs Lab 06/26/12 1249 06/27/12 0525  NA 138 137  K 5.1 4.3  CL 98 100  CO2 32 30  GLUCOSE 116* 92  BUN 50* 39*  CREATININE 1.38* 1.16*  CALCIUM 9.4 8.7   Cardiac Enzymes:  Recent Labs  06/26/12 1829 06/26/12 2350 06/27/12 0525  TROPONINI <0.30 <0.30 <0.30   CBC:  Recent Labs Lab 06/26/12 1249 06/27/12 0525  WBC 16.9* 10.8*  NEUTROABS 14.9*  --   HGB 11.5* 11.1*  HCT 35.4* 34.5*  MCV 91.9 91.8  PLT 276  255   PROTIME: No results found for this basename: LABPROT, INR,  in the last 72 hours Liver Function Tests:  Recent Labs  06/26/12 1249  AST 17  ALT 24  ALKPHOS 54  BILITOT 0.5  PROT 6.0  ALBUMIN 3.4*   No results found for this basename: LIPASE, AMYLASE,  in the last 72 hours BNP: BNP (last 3 results)  Recent Labs  06/26/12 1249  PROBNP 247.9   Thyroid Function Tests:  Recent Labs  06/26/12 1829  TSH 0.435   Anemia Panel:  Recent Labs  06/26/12 1829  VITAMINB12 733      ASSESSMENT AND PLAN:  Principal Problem:   Paroxysmal atrial fibrillation Active Problems:   Hypertension   Dizziness   Diarrhea   CKD (chronic kidney disease) stage 3, GFR 30-59 ml/min   Diastolic heart failure  OK to discharge flec 50 bid was inadequate to sustain SR,so will  Increase flecainide to 100 bid  F/u LHC 2-3 weeks to assess symptoms.  Specifically can we manage her with rate control which now seems  plausible, or will she need rhtyhm control.    Will increase bystolic 10--20 and stop hydra.nitrates Can resume amlodipine as out pt if necessary Rivaroxaban dosing needs to be reduced 2/2 renal insufficiency   Needs outpt sleep study Will also need echo,  i am not sure why she is on hydral.nitrates as last ef 50-55% Signed, Sherryl Manges MD  06/28/2012

## 2012-06-28 NOTE — Discharge Summary (Signed)
[facility]                                                                                   Jordan Byrd, is a 79 y.o. female  DOB [birth year]  MRN [id number].  Admission date:  06/26/2012  Discharge Date:  06/28/2012  Primary MD  Enrique Sack, MD  Admitting Physician  No admitting provider for patient encounter.  Admission Diagnosis  Diarrhea [787.91] Dizziness [780.4] Asthma exacerbation [493.92] Paroxysmal a-fib [427.31]  Discharge Diagnosis     Principal Problem:   Paroxysmal atrial fibrillation Active Problems:   Hypertension   Dizziness   Diarrhea   CKD (chronic kidney disease) stage 3, GFR 30-59 ml/min   Diastolic heart failure      Past Medical History  Diagnosis Date  . Asthma   . Shortness of breath   . Arthritis   . Anxiety   . Hypertension   . Atrial fibrillation     a. 04/04/11  . Osteoarthritis     a. 03/28/11 - R Total Hip Arthroplasty  . Cataracts, both eyes   . Pneumonia   . Bronchitis   . Pleurisy   . Ankle swelling   . Kidney infection   . Frequent urination   . Excessive urination at night   . Chronic headaches   . Weight gain     Past Surgical History  Procedure Laterality Date  . Cardiac catheterization      1980's or 90's - reportedly normal  . Abdominal hysterectomy  1962  . Appendectomy  1954  . Cholecystectomy    . Back surgery  1990  . Cataract extraction bilateral w/ anterior vitrectomy  2007/2008  . Total hip arthroplasty  03/28/2011    Procedure: TOTAL HIP ARTHROPLASTY;  Surgeon: Thera Flake., MD;  Location: [facility] OR;  Service: Orthopedics;  Laterality: Right;  . Cardioversion  04/05/2011    Procedure: CARDIOVERSION;  Surgeon: Gardiner Rhyme, MD;  Location: [facility] OR;  Service: Cardiovascular;  Laterality: N/A;  . Tonsillectomy  1943  . Adenoidectomy  1949  . Foot surgery  1947  . Thoracic disc surgery  2008  . Lumbar disc surgery  2010  . Knee surgery       both knees  . Shoulder surgery      left  . Total  hip arthroplasty  03/28/2011    right  . Cardioversion N/A 06/19/2012    Procedure: CARDIOVERSION-bedside(3W36);  Surgeon: Lewayne Bunting, MD;  Location: [facility] OR;  Service: Cardiovascular;  Laterality: N/A;     Recommendations for primary care physician for things to follow:       Discharge Diagnoses:   Principal Problem:   Paroxysmal atrial fibrillation Active Problems:   Hypertension   Dizziness   Diarrhea   CKD (chronic kidney disease) stage 3, GFR 30-59 ml/min   Diastolic heart failure    Discharge Condition: Stable   Diet recommendation: See Discharge Instructions below   Consults cardiology    History of present illness and  Hospital Course:     Kindly see H&P for history of present illness and admission details, please review complete Labs, Consult reports and  Test reports for all details in brief Jordan Byrd, is a 79 y.o. female, patient with history of Paroxysmal atrial fibrillation, chronic kidney disease stage IV, chronic diastolic dysfunction last EF 50% on echogram in April 2013, hypertension who was admitted to the hospital with dizziness caused by mild dehydration from diarrhea which has resolved along with atrial fibrillation with RVR, she was treated with gentle IV fluids, her flecainide and beta blocker dose or increase under the guidance of cardiology, she remained stable on telemetry with atrial fibrillation and rate control, she is already on xaralto which will be continued, her beta blocker and statin I does have been increased upon discharge as suggested by cardiology and her hydralazine-nitroglycerin combination has been stopped as suggested by cardiology, patient was never orthostatic, her troponins were negative, she is now symptom-free and eager to go home, I discussed her case with Dr. Graciela Husbands who suggested she could be discharged home with above plan. She will follow with her primary cardiologist within a week.   Her other problems of chronic  diastolic CHF, hypertension, and chronic kidney disease stage IV all stable and at baseline.She had mild renal insufficiency upon admission which has resolved after IV fluids, her home dose ACE inhibitor and HCTZ comminution will be resumed, her diarrhea has resolved, we'll request PCP to repeat BMP and monitor renal function closely post discharge.        Today   Subjective:   Jordan Byrd today has no headache,no chest abdominal pain,no new weakness tingling or numbness, feels much better wants to go home today.    Objective:   Blood pressure 122/52, pulse 86, temperature 98.1 F (36.7 C), temperature source Oral, resp. rate 20, height 5\' 6"  (1.676 m), weight 112.5 kg (248 lb 0.3 oz), SpO2 100.00%.   Intake/Output Summary (Last 24 hours) at 06/28/12 0859 Last data filed at 06/28/12 0542  Gross per 24 hour  Intake    840 ml  Output    700 ml  Net    140 ml    Exam Awake Alert, Oriented *3, No new F.N deficits, Normal affect [place].AT,PERRAL Supple Neck,No JVD, No cervical lymphadenopathy appriciated.  Symmetrical Chest wall movement, Good air movement bilaterally, CTAB RRR,No Gallops,Rubs or new Murmurs, No Parasternal Heave +ve B.Sounds, Abd Soft, Non tender, No organomegaly appriciated, No rebound -guarding or rigidity. No Cyanosis, Clubbing or edema, No new Rash or bruise  Data Review   Major procedures and Radiology Reports - PLEASE review detailed and final reports for all details in brief -       Ct Head Wo Contrast  06/26/2012   *RADIOLOGY REPORT*  Clinical Data: Dizziness, ataxia  CT HEAD WITHOUT CONTRAST  Technique:  Contiguous axial images were obtained from the base of the skull through the vertex without contrast.  Comparison: MRI brain dated 04/16/2007  Findings: No evidence of parenchymal hemorrhage or extra-axial fluid collection. No mass lesion, mass effect, or midline shift.  No CT evidence of acute infarction.  Subcortical white matter and periventricular  small vessel ischemic changes.  Mild intracranial atherosclerosis.  Cerebral volume is age appropriate.  No ventriculomegaly.  Mucous retention cyst in the left sphenoid sinus.  Partial opacification of the right mastoid air cells, chronic.  No evidence of calvarial fracture.  IMPRESSION: No evidence of acute intracranial abnormality.  Small vessel ischemic changes with intracranial atherosclerosis.   Original Report Authenticated By: Charline Bills, M.D.   Dg Chest Port 1 View  06/26/2012   *RADIOLOGY REPORT*  Clinical Data: Shortness of breath.  Wheezing.  Current history of hypertension.  PORTABLE CHEST - 1 VIEW 06/26/2012 1132 hours:  Comparison: Portable chest x-ray 06/18/2012, 06/14/2012, 03/28/2011 and two-view chest x-ray 04/04/2011.  Findings: Cardiac silhouette normal in size for the AP portable technique.  Thoracic aorta atherosclerotic, unchanged.  Hilar and mediastinal contours otherwise unremarkable.  Lungs clear. Bronchovascular markings normal.  Pulmonary vascularity normal.  No pneumothorax.  No pleural effusions.  IMPRESSION: No acute cardiopulmonary disease.   Original Report Authenticated By: Hulan Saas, M.D.   Dg Chest Port 1 View  06/18/2012   *RADIOLOGY REPORT*  Clinical Data: Shortness of breath.  Mid chest pain.  Productive cough.  PORTABLE CHEST - 1 VIEW  Comparison: 06/14/2012  Findings: Heart size and pulmonary vascularity are normal and the lungs are clear.  No acute osseous abnormality.  IMPRESSION: No acute disease.   Original Report Authenticated By: Francene Boyers, M.D.   Dg Chest Portable 1 View  06/14/2012   *RADIOLOGY REPORT*  Clinical Data: Shortness of breath  PORTABLE CHEST - 1 VIEW  Comparison: 04/04/2011  Findings: Aortic atherosclerosis.  Cardiomediastinal contours otherwise within normal range.  Mild lung base atelectasis or scarring.  Otherwise, no confluent airspace opacity, pleural effusion, or pneumothorax.  Cervical fusion hardware is partially imaged.   No acute osseous finding.  IMPRESSION: Mild lung base opacities, favor atelectasis or scarring.   Original Report Authenticated By: Jearld Lesch, M.D.    Micro Results      Recent Results (from the past 240 hour(s))  CLOSTRIDIUM DIFFICILE BY PCR     Status: None   Collection Time    06/26/12  7:29 PM      Result Value Range Status   C difficile by pcr NEGATIVE  NEGATIVE Final     CBC w Diff: Lab Results  Component Value Date   WBC 10.8* 06/27/2012   HGB 11.1* 06/27/2012   HCT 34.5* 06/27/2012   PLT 255 06/27/2012   LYMPHOPCT 8* 06/26/2012   MONOPCT 4 06/26/2012   EOSPCT 0 06/26/2012   BASOPCT 0 06/26/2012    CMP: Lab Results  Component Value Date   NA 137 06/27/2012   K 4.3 06/27/2012   CL 100 06/27/2012   CO2 30 06/27/2012   BUN 39* 06/27/2012   CREATININE 1.16* 06/27/2012   PROT 6.0 06/26/2012   ALBUMIN 3.4* 06/26/2012   BILITOT 0.5 06/26/2012   ALKPHOS 54 06/26/2012   AST 17 06/26/2012   ALT 24 06/26/2012  .   Discharge Instructions     Follow with Primary MD GREEN, Lorenda Ishihara, MD in 3 days   Get CBC, CMP, checked 3 days by Primary MD and again as instructed by your Primary MD.    Get Medicines reviewed and adjusted.  Please request your Prim.MD to go over all Hospital Tests and Procedure/Radiological results at the follow up, please get all Hospital records sent to your Prim MD by signing hospital release before you go home.  Activity: As tolerated with Full fall precautions use walker/cane & assistance as needed   Diet:  Heart Healthy  For Heart failure patients - Check your Weight same time everyday, if you gain over 2 pounds, or you develop in leg swelling, experience more shortness of breath or chest pain, call your Primary MD immediately. Follow Cardiac Low Salt Diet and 1.8 lit/day fluid restriction.  Disposition Home    If you experience worsening of your admission symptoms, develop shortness of breath, life threatening emergency,  suicidal or homicidal  thoughts you must seek medical attention immediately by calling 911 or calling your MD immediately  if symptoms less severe.  You Must read complete instructions/literature along with all the possible adverse reactions/side effects for all the Medicines you take and that have been prescribed to you. Take any new Medicines after you have completely understood and accpet all the possible adverse reactions/side effects.   Do not drive and provide baby sitting services if your were admitted for syncope or siezures until you have seen by Primary MD or a Neurologist and advised to do so again.  Do not drive when taking Pain medications.    Do not take more than prescribed Pain, Sleep and Anxiety Medications  Special Instructions: If you have smoked or chewed Tobacco  in the last 2 yrs please stop smoking, stop any regular Alcohol  and or any Recreational drug use.  Wear Seat belts while driving.   Follow-up Information   Follow up with GREEN, Lorenda Ishihara, MD. Schedule an appointment as soon as possible for a visit in 3 days.   Contact information:   [address] [place] [address] [phone]       Follow up with Hillis Range, MD. Schedule an appointment as soon as possible for a visit in 1 week.   Contact information:   [address], [address] [place] [address] [phone]         Discharge Medications     Medication List    STOP taking these medications       isosorbide-hydrALAZINE 20-37.5 MG per tablet  Commonly known as:  BIDIL      TAKE these medications       acetaminophen 325 MG tablet  Commonly known as:  TYLENOL  Take 650 mg by mouth 2 (two) times daily. For temp above 102     amLODipine 10 MG tablet  Commonly known as:  NORVASC  Take 10 mg by mouth daily.     benzonatate 100 MG capsule  Commonly known as:  TESSALON  Take 1 capsule (100 mg total) by mouth 3 (three) times daily as needed for cough.     calcium carbonate 600 MG Tabs   Commonly known as:  OS-CAL  Take 600 mg by mouth 2 (two) times daily with a meal.     chlorzoxazone 500 MG tablet  Commonly known as:  PARAFON  Take 250 mg by mouth 3 (three) times daily as needed for muscle spasms.     flecainide 100 MG tablet  Commonly known as:  TAMBOCOR  Take 1 tablet (100 mg total) by mouth every 12 (twelve) hours.     Fluticasone-Salmeterol 250-50 MCG/DOSE Aepb  Commonly known as:  ADVAIR  Inhale 1 puff into the lungs at bedtime.     GLUCOSAMINE CHONDR 1500 COMPLX PO  Take 1 tablet by mouth daily.     hydrochlorothiazide 25 MG tablet  Commonly known as:  HYDRODIURIL  Take 25 mg by mouth daily.     ICAPS PO  Take 1 tablet by mouth 2 (two) times daily.     lisinopril 40 MG tablet  Commonly known as:  PRINIVIL,ZESTRIL  Take 40 mg by mouth daily.     multivitamin with minerals Tabs  Take 1 tablet by mouth daily.     Nebivolol HCl 20 MG Tabs  Take 1 tablet (20 mg total) by mouth daily.     omeprazole 20 MG capsule  Commonly known  as:  PRILOSEC  Take 20 mg by mouth daily.     Rivaroxaban 20 MG Tabs  Commonly known as:  XARELTO  Take 1 tablet (20 mg total) by mouth daily.           Total Time in preparing paper work, data evaluation and todays exam - 35 minutes  Leroy Sea M.D on 06/28/2012 at 8:59 AM  [facility] Office  [phone]

## 2012-06-28 NOTE — Progress Notes (Signed)
Physical Therapy Treatment Patient Details Name: Jordan Byrd MRN: [id number] DOB: April 20, 1933 Today's Date: 06/28/2012 Time: 4098-1191 PT Time Calculation (min): 15 min  PT Assessment / Plan / Recommendation Comments on Treatment Session  Pt s/p afib with decr mobility secondary to decr endurance and decr activity tolerance.  Has O2, cane and RW at home she can use.  Does not need any PT f/u at home.      Follow Up Recommendations  No PT follow up;Supervision - Intermittent                 Equipment Recommendations  None recommended by PT        Frequency Min 3X/week   Plan Discharge plan remains appropriate;Frequency remains appropriate    Precautions / Restrictions Precautions Precautions: None Restrictions Weight Bearing Restrictions: No   Pertinent Vitals/Pain VSS, No pain    Mobility  Bed Mobility Bed Mobility: Supine to Sit;Sitting - Scoot to Edge of Bed Supine to Sit: 6: Modified independent (Device/Increase time);With rails Sitting - Scoot to Edge of Bed: 6: Modified independent (Device/Increase time);With rail Details for Bed Mobility Assistance: No cues or assist needed Transfers Transfers: Sit to Stand;Stand to Sit Sit to Stand: 6: Modified independent (Device/Increase time);With upper extremity assist;From bed Stand to Sit: 6: Modified independent (Device/Increase time);With upper extremity assist;To bed Details for Transfer Assistance: Cues for hand placement Ambulation/Gait Ambulation/Gait Assistance: 5: Supervision Ambulation Distance (Feet): 15 Feet Assistive device: None Ambulation/Gait Assistance Details: ambulating on O2 at 3L Gait Pattern: Step-through pattern;Decreased stride length Gait velocity: Slow to minimize dyspnea Stairs: No (has ramp) Wheelchair Mobility Wheelchair Mobility: No    PT Goals Acute Rehab PT Goals Pt will Ambulate: 51 - 150 feet;with modified independence;with least restrictive assistive device PT Goal: Ambulate -  Progress: Progressing toward goal  Visit Information  Last PT Received On: 06/28/12 Assistance Needed: +1    Subjective Data  Subjective: "I am going home." Patient Stated Goal: to go home   Cognition  Cognition Arousal/Alertness: Awake/alert Behavior During Therapy: WFL for tasks assessed/performed Overall Cognitive Status: Within Functional Limits for tasks assessed    Balance  Static Standing Balance Static Standing - Balance Support: Bilateral upper extremity supported;During functional activity Static Standing - Level of Assistance: 5: Stand by assistance Static Standing - Comment/# of Minutes: 3 minutes washing hands at sink without loss of balance and no device  End of Session PT - End of Session Equipment Utilized During Treatment: Gait belt;Oxygen Activity Tolerance: Patient limited by fatigue (Dyspnea 2/4 after ambulation) Patient left: in bed;with call bell/phone within reach (sitting at EOB) Nurse Communication: Mobility status   GP Functional Assessment Tool Used: Clinical judgement Functional Limitation: Mobility: Walking and moving around Mobility: Walking and Moving Around Current Status (Y7829): At least 1 percent but less than 20 percent impaired, limited or restricted Mobility: Walking and Moving Around Goal Status [phone]): 0 percent impaired, limited or restricted Mobility: Walking and Moving Around Discharge Status [phone]): At least 1 percent but less than 20 percent impaired, limited or restricted   INGOLD,Wreatha Sturgeon 06/28/2012, 10:06 AM [facility] [phone] (pager)

## 2012-07-01 LAB — VITAMIN D 1,25 DIHYDROXY
Vitamin D 1, 25 (OH)2 Total: 22 pg/mL (ref 18–72)
Vitamin D3 1, 25 (OH)2: 22 pg/mL

## 2012-07-06 ENCOUNTER — Telehealth: Payer: Self-pay | Admitting: Internal Medicine

## 2012-07-06 ENCOUNTER — Encounter: Payer: Self-pay | Admitting: Nurse Practitioner

## 2012-07-06 ENCOUNTER — Ambulatory Visit (INDEPENDENT_AMBULATORY_CARE_PROVIDER_SITE_OTHER): Payer: Medicare Other | Admitting: Nurse Practitioner

## 2012-07-06 VITALS — BP 140/68 | HR 64 | Ht 66.0 in | Wt 250.4 lb

## 2012-07-06 DIAGNOSIS — I4891 Unspecified atrial fibrillation: Secondary | ICD-10-CM

## 2012-07-06 DIAGNOSIS — I480 Paroxysmal atrial fibrillation: Secondary | ICD-10-CM

## 2012-07-06 MED ORDER — MOMETASONE FURO-FORMOTEROL FUM 100-5 MCG/ACT IN AERO: 2.0000 | INHALATION_SPRAY | Freq: Two times a day (BID) | RESPIRATORY_TRACT | Status: DC

## 2012-07-06 NOTE — Progress Notes (Signed)
Jordan Byrd Date of Birth: 13-Feb-1933 Medical Record #[id number]  History of Present Illness: Jordan Byrd is seen back today for a post hospital visit. Seen for Dr. Johney Frame. She has PAF, HTN, CKD and diastolic heart failure. Recently had asthma exacerbation requiring admission towards the end of May. She has no known CAD. She has had past cardioversion and has been very symptomatic  With her AF. Last cardioversion was 06/19/12 on Flecainide and was successful. Last echo in April of 2013 showing EF of 50 to 55%, moderate diastolic dysfunction and mildly dilated LV.   With this recent admission she was back in atrial fib, highly symptomatic. This was in the setting of diarrhea, generalized malaise, dizziness and blurred vision. BP was soft and she was hydrated with IVF. It was unclear as to whether she was taking her Flecainide. This was restarted and the dose was increased. May need possible repeat cardioversion after 3 weeks of therapy.   Comes in today. She is here with her son. Seems to be doing better. Says she is "wonderful" and chuckles. Tells me she feels much better than how she was while in the hospital. Now off of oxygen. Has been back to see Dr. Chilton Si and sees him again later this week. Still with some swelling in her feet. Some palpitations at times. Some dizziness if she turns too quick. Breathing has clearly improved. She does not really know if she is currently in or out of rhythm.   Current Outpatient Prescriptions on File Prior to Visit  Medication Sig Dispense Refill  . acetaminophen (TYLENOL) 325 MG tablet Take 650 mg by mouth 2 (two) times daily. For temp above 102      . amLODipine (NORVASC) 10 MG tablet Take 10 mg by mouth daily.      . benzonatate (TESSALON) 100 MG capsule Take 1 capsule (100 mg total) by mouth 3 (three) times daily as needed for cough.  20 capsule  0  . calcium carbonate (OS-CAL) 600 MG TABS Take 600 mg by mouth 2 (two) times daily with a meal.      .  chlorzoxazone (PARAFON) 500 MG tablet Take 250 mg by mouth 3 (three) times daily as needed for muscle spasms.      . flecainide (TAMBOCOR) 100 MG tablet Take 1 tablet (100 mg total) by mouth every 12 (twelve) hours.  60 tablet  0  . Glucosamine-Chondroit-Vit C-Mn (GLUCOSAMINE CHONDR 1500 COMPLX PO) Take 1 tablet by mouth daily.      Marland Kitchen lisinopril (PRINIVIL,ZESTRIL) 40 MG tablet Take 40 mg by mouth daily.      . Multiple Vitamin (MULITIVITAMIN WITH MINERALS) TABS Take 1 tablet by mouth daily.      . Multiple Vitamins-Minerals (ICAPS PO) Take 1 tablet by mouth 2 (two) times daily.      . nebivolol 20 MG TABS Take 1 tablet (20 mg total) by mouth daily.  30 tablet  0  . omeprazole (PRILOSEC) 20 MG capsule Take 20 mg by mouth daily.      . Rivaroxaban (XARELTO) 20 MG TABS Take 1 tablet (20 mg total) by mouth daily.  30 tablet  0   No current facility-administered medications on file prior to visit.    Allergies  Allergen Reactions  . Aspirin Nausea And Vomiting  . Aspirin Buf(Alhyd-Mghyd-Cacar) Nausea And Vomiting  . Butazolidin (Phenylbutazone) Other (See Comments)    unknown  . Celebrex (Celecoxib) Other (See Comments)    unknown  . Codeine Nausea And  Vomiting  . Doripenem Other (See Comments)    unknown  . Methocarbamol Hives  . Temazepam Other (See Comments)    unknown  . Vicodin (Hydrocodone-Acetaminophen)     Interacts with bp medication and drops blood pressure    Past Medical History  Diagnosis Date  . Asthma   . Shortness of breath   . Arthritis   . Anxiety   . Hypertension   . Atrial fibrillation     a. 04/04/11  . Osteoarthritis     a. 03/28/11 - R Total Hip Arthroplasty  . Cataracts, both eyes   . Pneumonia   . Bronchitis   . Pleurisy   . Ankle swelling   . Kidney infection   . Frequent urination   . Excessive urination at night   . Chronic headaches   . Weight gain     Past Surgical History  Procedure Laterality Date  . Cardiac catheterization      1980's  or 90's - reportedly normal  . Abdominal hysterectomy  1962  . Appendectomy  1954  . Cholecystectomy    . Back surgery  1990  . Cataract extraction bilateral w/ anterior vitrectomy  2007/2008  . Total hip arthroplasty  03/28/2011    Procedure: TOTAL HIP ARTHROPLASTY;  Surgeon: Thera Flake., MD;  Location: [facility] OR;  Service: Orthopedics;  Laterality: Right;  . Cardioversion  04/05/2011    Procedure: CARDIOVERSION;  Surgeon: Gardiner Rhyme, MD;  Location: [facility] OR;  Service: Cardiovascular;  Laterality: N/A;  . Tonsillectomy  1943  . Adenoidectomy  1949  . Foot surgery  1947  . Thoracic disc surgery  2008  . Lumbar disc surgery  2010  . Knee surgery       both knees  . Shoulder surgery      left  . Total hip arthroplasty  03/28/2011    right  . Cardioversion N/A 06/19/2012    Procedure: CARDIOVERSION-bedside(3W36);  Surgeon: Lewayne Bunting, MD;  Location: [facility] OR;  Service: Cardiovascular;  Laterality: N/A;    History  Smoking status  . Former Smoker -- 21 years  . Types: Cigarettes  . Quit date: 03/17/1970  Smokeless tobacco  . Never Used    History  Alcohol Use No    Family History  Problem Relation Age of Onset  . Diabetes Father     Father, Mother, 4 sisters (2 living)  . Heart attack Father     (Deceased)  . Heart failure Father     (deceased 24)  . Stroke Mother     (deceased 25)  . Cancer Sister     (deceased)  . Hypertension Father   . Hypertension Mother   . Heart attack Sister   . Diabetes Sister     Review of Systems: The review of systems is per the HPI.  All other systems were reviewed and are negative.  Physical Exam: BP 140/68  Pulse 64  Ht 5\' 6"  (1.676 m)  Wt 250 lb 6.4 oz (113.581 kg)  BMI 40.44 kg/m2 Patient is very pleasant and in no acute distress. Quite jovial. She is obese. Skin is warm and dry. Color is normal.  HEENT is unremarkable. Normocephalic/atraumatic. PERRL. Sclera are nonicteric. Neck is supple. No masses. No JVD. Lungs are  clear. Cardiac exam shows an irregular rhythm. Rate is ok. Abdomen is soft. Extremities are with 1+ edema. Gait and ROM are intact. She is using a walker. No gross neurologic deficits noted.  LABORATORY DATA:  EKG today shows atrial fib with a controlled/sl slow VR - rate is 57.   Lab Results  Component Value Date   WBC 10.8* 06/27/2012   HGB 11.1* 06/27/2012   HCT 34.5* 06/27/2012   PLT 255 06/27/2012   GLUCOSE 92 06/27/2012   CHOL 204* 04/05/2011   TRIG 211* 04/05/2011   HDL 26* 04/05/2011   LDLCALC 136* 04/05/2011   ALT 24 06/26/2012   AST 17 06/26/2012   NA 137 06/27/2012   K 4.3 06/27/2012   CL 100 06/27/2012   CREATININE 1.16* 06/27/2012   BUN 39* 06/27/2012   CO2 30 06/27/2012   TSH 0.435 06/26/2012   INR 1.07 04/04/2011   Echo Study Conclusions from April 2013  - Left ventricle: The cavity size was mildly dilated. Wall thickness was normal. Systolic function was low normal to mildly reduced. The estimated ejection fraction was in the range of 50% to 55%. Wall motion was normal; there were no regional wall motion abnormalities. Features are consistent with a pseudonormal left ventricular filling pattern, with concomitant abnormal relaxation and increased filling pressure (grade 2 diastolic dysfunction). - Aortic valve: There was no stenosis. - Mitral valve: Trivial regurgitation. - Left atrium: The atrium was mildly dilated. - Right ventricle: The cavity size was normal. Systolic function was normal. - Pulmonary arteries: PA peak pressure: 24mm Hg (S). - Inferior vena cava: The vessel was normal in size; the respirophasic diameter changes were in the normal range (= 50%); findings are consistent with normal central venous pressure.  Assessment / Plan: 1. Atrial fib - she does not appear to be "highly symptomatic" to me.Tells me that she is doing much better since discharge. She remains in atrial fib.  I would almost favor rate control. She will be committed to long term anticoagulation  regardless. Will need to be on the higher dose of the flecainide for about 3 weeks before entertaining repeat cardioversion. Would like Dr. Jenel Lucks input. We will both be seeing her back on return. Further disposition to follow.   2. Diastolic heart failure  3. HTN  Patient is agreeable to this plan and will call if any problems develop in the interim.   Rosalio Macadamia, RN, ANP-C [facility] [address] [place], [place]  [address]

## 2012-07-06 NOTE — Patient Instructions (Signed)
Stay on your current medicines  See Dr. Johney Frame in 3 weeks for discussion of possible cardioversion versus managing with rate control  Call the [facility] office at [phone] if you have any questions, problems or concerns.

## 2012-07-06 NOTE — Telephone Encounter (Signed)
Can you note that?

## 2012-07-06 NOTE — Telephone Encounter (Signed)
Follow Up  Pt is calling back to let you know the name of the inhaler that the patient was prescribed at the hospital. The name of the inhaler is dulera. Patient was seen by Norma Fredrickson today.

## 2012-07-12 ENCOUNTER — Emergency Department (HOSPITAL_COMMUNITY): Payer: Medicare Other

## 2012-07-12 ENCOUNTER — Encounter (HOSPITAL_COMMUNITY): Payer: Self-pay | Admitting: Emergency Medicine

## 2012-07-12 ENCOUNTER — Inpatient Hospital Stay (HOSPITAL_COMMUNITY)
Admission: EM | Admit: 2012-07-12 | Discharge: 2012-07-16 | DRG: 292 | Disposition: A | Discharge status: Home / Self Care | Payer: Medicare Other | Attending: Cardiovascular Disease | Admitting: Cardiovascular Disease

## 2012-07-12 DIAGNOSIS — J449 Chronic obstructive pulmonary disease, unspecified: Secondary | ICD-10-CM | POA: Diagnosis present

## 2012-07-12 DIAGNOSIS — Z79899 Other long term (current) drug therapy: Secondary | ICD-10-CM

## 2012-07-12 DIAGNOSIS — R0603 Acute respiratory distress: Secondary | ICD-10-CM

## 2012-07-12 DIAGNOSIS — Z87891 Personal history of nicotine dependence: Secondary | ICD-10-CM

## 2012-07-12 DIAGNOSIS — M159 Polyosteoarthritis, unspecified: Secondary | ICD-10-CM | POA: Diagnosis present

## 2012-07-12 DIAGNOSIS — Z6841 Body Mass Index (BMI) 40.0 and over, adult: Secondary | ICD-10-CM

## 2012-07-12 DIAGNOSIS — I5033 Acute on chronic diastolic (congestive) heart failure: Principal | ICD-10-CM

## 2012-07-12 DIAGNOSIS — F419 Anxiety disorder, unspecified: Secondary | ICD-10-CM

## 2012-07-12 DIAGNOSIS — N390 Urinary tract infection, site not specified: Secondary | ICD-10-CM

## 2012-07-12 DIAGNOSIS — Z96649 Presence of unspecified artificial hip joint: Secondary | ICD-10-CM

## 2012-07-12 DIAGNOSIS — N179 Acute kidney failure, unspecified: Secondary | ICD-10-CM

## 2012-07-12 DIAGNOSIS — I10 Essential (primary) hypertension: Secondary | ICD-10-CM

## 2012-07-12 DIAGNOSIS — N189 Chronic kidney disease, unspecified: Secondary | ICD-10-CM | POA: Diagnosis present

## 2012-07-12 DIAGNOSIS — I129 Hypertensive chronic kidney disease with stage 1 through stage 4 chronic kidney disease, or unspecified chronic kidney disease: Secondary | ICD-10-CM | POA: Diagnosis present

## 2012-07-12 DIAGNOSIS — I509 Heart failure, unspecified: Secondary | ICD-10-CM | POA: Diagnosis present

## 2012-07-12 DIAGNOSIS — E669 Obesity, unspecified: Secondary | ICD-10-CM | POA: Diagnosis present

## 2012-07-12 DIAGNOSIS — R079 Chest pain, unspecified: Secondary | ICD-10-CM

## 2012-07-12 DIAGNOSIS — R197 Diarrhea, unspecified: Secondary | ICD-10-CM

## 2012-07-12 DIAGNOSIS — F411 Generalized anxiety disorder: Secondary | ICD-10-CM | POA: Diagnosis present

## 2012-07-12 DIAGNOSIS — I480 Paroxysmal atrial fibrillation: Secondary | ICD-10-CM

## 2012-07-12 DIAGNOSIS — J4489 Other specified chronic obstructive pulmonary disease: Secondary | ICD-10-CM | POA: Diagnosis present

## 2012-07-12 DIAGNOSIS — R42 Dizziness and giddiness: Secondary | ICD-10-CM

## 2012-07-12 DIAGNOSIS — I4891 Unspecified atrial fibrillation: Secondary | ICD-10-CM

## 2012-07-12 DIAGNOSIS — M1990 Unspecified osteoarthritis, unspecified site: Secondary | ICD-10-CM

## 2012-07-12 LAB — COMPREHENSIVE METABOLIC PANEL
ALT: 16 U/L (ref 0–35)
AST: 11 U/L (ref 0–37)
CO2: 32 mEq/L (ref 19–32)
Chloride: 100 mEq/L (ref 96–112)
Creatinine, Ser: 1.44 mg/dL — ABNORMAL HIGH (ref 0.50–1.10)
GFR calc non Af Amer: 34 mL/min — ABNORMAL LOW (ref >90)
Total Bilirubin: 0.5 mg/dL (ref 0.3–1.2)

## 2012-07-12 LAB — CREATININE, SERUM
GFR calc Af Amer: 40 mL/min — ABNORMAL LOW (ref >90)
GFR calc non Af Amer: 34 mL/min — ABNORMAL LOW (ref >90)

## 2012-07-12 LAB — CBC WITH DIFFERENTIAL/PLATELET
Basophils Absolute: 0 10*3/uL (ref 0.0–0.1)
HCT: 30.9 % — ABNORMAL LOW (ref 36.0–46.0)
Hemoglobin: 9.7 g/dL — ABNORMAL LOW (ref 12.0–15.0)
Lymphocytes Relative: 7 % — ABNORMAL LOW (ref 12–46)
Lymphs Abs: 0.6 10*3/uL — ABNORMAL LOW (ref 0.7–4.0)
Monocytes Absolute: 1 10*3/uL (ref 0.1–1.0)
Neutro Abs: 6.4 10*3/uL (ref 1.7–7.7)
RBC: 3.24 MIL/uL — ABNORMAL LOW (ref 3.87–5.11)
RDW: 15.3 % (ref 11.5–15.5)
WBC: 8.3 10*3/uL (ref 4.0–10.5)

## 2012-07-12 LAB — URINALYSIS, ROUTINE W REFLEX MICROSCOPIC
Bilirubin Urine: NEGATIVE
Glucose, UA: NEGATIVE mg/dL
Hgb urine dipstick: NEGATIVE
Ketones, ur: NEGATIVE mg/dL
pH: 6 (ref 5.0–8.0)

## 2012-07-12 LAB — CBC
HCT: 30 % — ABNORMAL LOW (ref 36.0–46.0)
Hemoglobin: 9.6 g/dL — ABNORMAL LOW (ref 12.0–15.0)
WBC: 8.5 10*3/uL (ref 4.0–10.5)

## 2012-07-12 LAB — TROPONIN I: Troponin I: <0.3 ng/mL (ref <0.30)

## 2012-07-12 LAB — URINE MICROSCOPIC-ADD ON

## 2012-07-12 MED ORDER — FUROSEMIDE 10 MG/ML IJ SOLN: 80.0000 mg | Freq: Two times a day (BID) | INTRAMUSCULAR | Status: DC

## 2012-07-12 MED ORDER — SODIUM CHLORIDE 0.9 % IJ SOLN: 3.0000 mL | INTRAMUSCULAR | Status: DC | PRN

## 2012-07-12 MED ORDER — NEBIVOLOL HCL 10 MG PO TABS
20.0000 mg | ORAL_TABLET | Freq: Every day | ORAL | Status: DC
  Administered 2012-07-13 – 2012-07-16 (×4): 20 mg via ORAL
  Filled 2012-07-12 (×4): qty 2

## 2012-07-12 MED ORDER — POTASSIUM CHLORIDE CRYS ER 20 MEQ PO TBCR
20.0000 meq | EXTENDED_RELEASE_TABLET | Freq: Two times a day (BID) | ORAL | Status: DC
  Administered 2012-07-12 – 2012-07-16 (×6): 20 meq via ORAL
  Filled 2012-07-12 (×10): qty 1

## 2012-07-12 MED ORDER — CHLORZOXAZONE 500 MG PO TABS
250.0000 mg | ORAL_TABLET | Freq: Three times a day (TID) | ORAL | Status: DC | PRN
  Filled 2012-07-12: qty 1

## 2012-07-12 MED ORDER — ONDANSETRON HCL 4 MG/2ML IJ SOLN: 4.0000 mg | Freq: Four times a day (QID) | INTRAMUSCULAR | Status: DC | PRN

## 2012-07-12 MED ORDER — ACETAMINOPHEN 325 MG PO TABS
650.0000 mg | ORAL_TABLET | Freq: Two times a day (BID) | ORAL | Status: DC | PRN
  Administered 2012-07-14 – 2012-07-15 (×3): 650 mg via ORAL
  Filled 2012-07-12 (×3): qty 2

## 2012-07-12 MED ORDER — PANTOPRAZOLE SODIUM 40 MG PO TBEC
40.0000 mg | DELAYED_RELEASE_TABLET | Freq: Every day | ORAL | Status: DC
  Administered 2012-07-13 – 2012-07-16 (×4): 40 mg via ORAL
  Filled 2012-07-12 (×5): qty 1

## 2012-07-12 MED ORDER — FLECAINIDE ACETATE 100 MG PO TABS
100.0000 mg | ORAL_TABLET | Freq: Two times a day (BID) | ORAL | Status: DC
  Administered 2012-07-12 – 2012-07-16 (×8): 100 mg via ORAL
  Filled 2012-07-12 (×10): qty 1

## 2012-07-12 MED ORDER — OCUVITE-LUTEIN PO CAPS
1.0000 | ORAL_CAPSULE | Freq: Two times a day (BID) | ORAL | Status: DC
  Administered 2012-07-13 (×2): 1 via ORAL
  Filled 2012-07-12 (×5): qty 1

## 2012-07-12 MED ORDER — BENZONATATE 100 MG PO CAPS
100.0000 mg | ORAL_CAPSULE | Freq: Three times a day (TID) | ORAL | Status: DC | PRN
  Filled 2012-07-12: qty 1

## 2012-07-12 MED ORDER — CALCIUM CARBONATE 600 MG PO TABS: 600.0000 mg | ORAL_TABLET | Freq: Two times a day (BID) | ORAL | Status: DC

## 2012-07-12 MED ORDER — MOMETASONE FURO-FORMOTEROL FUM 100-5 MCG/ACT IN AERO
2.0000 | INHALATION_SPRAY | Freq: Two times a day (BID) | RESPIRATORY_TRACT | Status: DC
  Administered 2012-07-12 – 2012-07-16 (×7): 2 via RESPIRATORY_TRACT
  Filled 2012-07-12 (×2): qty 8.8

## 2012-07-12 MED ORDER — RIVAROXABAN 20 MG PO TABS
20.0000 mg | ORAL_TABLET | Freq: Every day | ORAL | Status: DC
  Administered 2012-07-13 – 2012-07-15 (×3): 20 mg via ORAL
  Filled 2012-07-12 (×4): qty 1

## 2012-07-12 MED ORDER — FUROSEMIDE 10 MG/ML IJ SOLN
80.0000 mg | Freq: Two times a day (BID) | INTRAMUSCULAR | Status: DC
  Administered 2012-07-12 – 2012-07-13 (×3): 80 mg via INTRAVENOUS
  Filled 2012-07-12 (×5): qty 8

## 2012-07-12 MED ORDER — LISINOPRIL 40 MG PO TABS
40.0000 mg | ORAL_TABLET | Freq: Every day | ORAL | Status: DC
  Administered 2012-07-13 – 2012-07-16 (×4): 40 mg via ORAL
  Filled 2012-07-12 (×4): qty 1

## 2012-07-12 MED ORDER — CALCIUM CARBONATE 1250 (500 CA) MG PO TABS
1.0000 | ORAL_TABLET | Freq: Two times a day (BID) | ORAL | Status: DC
  Administered 2012-07-13 – 2012-07-16 (×7): 500 mg via ORAL
  Filled 2012-07-12 (×9): qty 1

## 2012-07-12 MED ORDER — SODIUM CHLORIDE 0.9 % IV SOLN: 250.0000 mL | INTRAVENOUS | Status: DC | PRN

## 2012-07-12 MED ORDER — SODIUM CHLORIDE 0.9 % IJ SOLN
3.0000 mL | Freq: Two times a day (BID) | INTRAMUSCULAR | Status: DC
  Administered 2012-07-12 – 2012-07-15 (×6): 3 mL via INTRAVENOUS

## 2012-07-12 MED ORDER — AMLODIPINE BESYLATE 10 MG PO TABS
10.0000 mg | ORAL_TABLET | Freq: Every day | ORAL | Status: DC
  Administered 2012-07-13 – 2012-07-16 (×4): 10 mg via ORAL
  Filled 2012-07-12 (×4): qty 1

## 2012-07-12 NOTE — ED Notes (Signed)
Pt arrived by POV and was seen this morning at PCP who told her to come to ED for evaluation of CP. Pt stated that she started having coughing spells Saturday that woke her up and she took coughing medication and then has been having chest pressure that comes and goes. Stated that she gets pressure when she walks or does any activity.

## 2012-07-12 NOTE — ED Provider Notes (Signed)
History     CSN: [id number]  Arrival date & time 07/12/12  1454   First MD Initiated Contact with Patient 07/12/12 1503      Chief Complaint  Patient presents with  . Chest Pain    (Consider location/radiation/quality/duration/timing/severity/associated sxs/prior treatment) HPI Pt with several days of central chest pressure with exertion which is relieved with rest. It is accompanied by SOB. No fever or chills. No current CP or SOB. History of COPD and AFib. No known CAD. No new lower ext pain or swelling. Pt recently admitted to hospital for afib and COPD exacerbation.  Past Medical History  Diagnosis Date  . Asthma   . Shortness of breath   . Arthritis   . Anxiety   . Hypertension   . Atrial fibrillation     a. 04/04/11  . Osteoarthritis     a. 03/28/11 - R Total Hip Arthroplasty  . Cataracts, both eyes   . Pneumonia   . Bronchitis   . Pleurisy   . Ankle swelling   . Kidney infection   . Frequent urination   . Excessive urination at night   . Chronic headaches   . Weight gain     Past Surgical History  Procedure Laterality Date  . Cardiac catheterization      1980's or 90's - reportedly normal  . Abdominal hysterectomy  1962  . Appendectomy  1954  . Cholecystectomy    . Back surgery  1990  . Cataract extraction bilateral w/ anterior vitrectomy  2007/2008  . Total hip arthroplasty  03/28/2011    Procedure: TOTAL HIP ARTHROPLASTY;  Surgeon: Thera Flake., MD;  Location: [facility] OR;  Service: Orthopedics;  Laterality: Right;  . Cardioversion  04/05/2011    Procedure: CARDIOVERSION;  Surgeon: Gardiner Rhyme, MD;  Location: [facility] OR;  Service: Cardiovascular;  Laterality: N/A;  . Tonsillectomy  1943  . Adenoidectomy  1949  . Foot surgery  1947  . Thoracic disc surgery  2008  . Lumbar disc surgery  2010  . Knee surgery       both knees  . Shoulder surgery      left  . Total hip arthroplasty  03/28/2011    right  . Cardioversion N/A 06/19/2012    Procedure:  CARDIOVERSION-bedside(3W36);  Surgeon: Lewayne Bunting, MD;  Location: [facility] OR;  Service: Cardiovascular;  Laterality: N/A;    Family History  Problem Relation Age of Onset  . Diabetes Father     Father, Mother, 4 sisters (2 living)  . Heart attack Father     (Deceased)  . Heart failure Father     (deceased 52)  . Stroke Mother     (deceased 72)  . Cancer Sister     (deceased)  . Hypertension Father   . Hypertension Mother   . Heart attack Sister   . Diabetes Sister     History  Substance Use Topics  . Smoking status: Former Smoker -- 21 years    Types: Cigarettes    Quit date: 03/17/1970  . Smokeless tobacco: Never Used  . Alcohol Use: No    OB History   Grav Para Term Preterm Abortions TAB SAB Ect Mult Living                  Review of Systems  Constitutional: Negative for fever, chills and fatigue.  Respiratory: Positive for shortness of breath and wheezing. Negative for cough.   Cardiovascular: Positive for chest pain. Negative for  palpitations and leg swelling.  Gastrointestinal: Negative for nausea, vomiting and abdominal pain.  Genitourinary: Negative for dysuria and flank pain.  Musculoskeletal: Negative for myalgias and back pain.  Skin: Negative for rash and wound.  Neurological: Negative for dizziness, weakness, light-headedness, numbness and headaches.  All other systems reviewed and are negative.    Allergies  Aspirin; Aspirin buf(alhyd-mghyd-cacar); Butazolidin; Celebrex; Codeine; Doripenem; Methocarbamol; Temazepam; and Vicodin  Home Medications   Current Outpatient Rx  Name  Route  Sig  Dispense  Refill  . acetaminophen (TYLENOL) 325 MG tablet   Oral   Take 650 mg by mouth 2 (two) times daily. For temp above 102         . amLODipine (NORVASC) 10 MG tablet   Oral   Take 10 mg by mouth daily.         . benzonatate (TESSALON) 100 MG capsule   Oral   Take 1 capsule (100 mg total) by mouth 3 (three) times daily as needed for cough.   20  capsule   0   . calcium carbonate (OS-CAL) 600 MG TABS   Oral   Take 600 mg by mouth 2 (two) times daily with a meal.         . chlorzoxazone (PARAFON) 500 MG tablet   Oral   Take 250 mg by mouth 3 (three) times daily as needed for muscle spasms.         . flecainide (TAMBOCOR) 100 MG tablet   Oral   Take 1 tablet (100 mg total) by mouth every 12 (twelve) hours.   60 tablet   0   . furosemide (LASIX) 20 MG tablet   Oral   Take 20 mg by mouth daily.          . Glucosamine-Chondroit-Vit C-Mn (GLUCOSAMINE CHONDR 1500 COMPLX PO)   Oral   Take 1 tablet by mouth daily.         Marland Kitchen lisinopril (PRINIVIL,ZESTRIL) 40 MG tablet   Oral   Take 40 mg by mouth daily.         . mometasone-formoterol (DULERA) 100-5 MCG/ACT AERO   Inhalation   Inhale 2 puffs into the lungs 2 (two) times daily.         . Multiple Vitamin (MULITIVITAMIN WITH MINERALS) TABS   Oral   Take 1 tablet by mouth daily.         . Multiple Vitamins-Minerals (ICAPS PO)   Oral   Take 1 tablet by mouth 2 (two) times daily.         . nebivolol 20 MG TABS   Oral   Take 1 tablet (20 mg total) by mouth daily.   30 tablet   0   . omeprazole (PRILOSEC) 20 MG capsule   Oral   Take 20 mg by mouth daily.         . Rivaroxaban (XARELTO) 20 MG TABS   Oral   Take 1 tablet (20 mg total) by mouth daily.   30 tablet   0     BP 144/45  Pulse 79  Temp(Src) 98.7 F (37.1 C) (Oral)  Resp 26  SpO2 93%  Physical Exam  Nursing note and vitals reviewed. Constitutional: She is oriented to person, place, and time. She appears well-developed and well-nourished. No distress.  HENT:  Head: Normocephalic and atraumatic.  Mouth/Throat: Oropharynx is clear and moist.  Eyes: EOM are normal. Pupils are equal, round, and reactive to light.  Neck: Normal range of motion.  Neck supple.  Cardiovascular: Normal rate and regular rhythm.   Pulmonary/Chest: Effort normal. No respiratory distress. She has wheezes. She  has no rales.  Abdominal: Soft. Bowel sounds are normal. She exhibits no distension and no mass. There is no tenderness. There is no rebound and no guarding.  Musculoskeletal: Normal range of motion. She exhibits no edema and no tenderness.  No calf swelling or tenderness  Neurological: She is alert and oriented to person, place, and time.  5/5 motor in all ext, sensation intact  Skin: Skin is warm and dry. No rash noted. No erythema.  Psychiatric: She has a normal mood and affect. Her behavior is normal.    ED Course  Procedures (including critical care time)  Labs Reviewed  CBC WITH DIFFERENTIAL - Abnormal; Notable for the following:    RBC 3.24 (*)    Hemoglobin 9.7 (*)    HCT 30.9 (*)    Lymphocytes Relative 7 (*)    Lymphs Abs 0.6 (*)    Monocytes Relative 13 (*)    All other components within normal limits  COMPREHENSIVE METABOLIC PANEL - Abnormal; Notable for the following:    Glucose, Bld 113 (*)    BUN 29 (*)    Creatinine, Ser 1.44 (*)    Albumin 3.3 (*)    GFR calc non Af Amer 34 (*)    GFR calc Af Amer 40 (*)    All other components within normal limits  PRO B NATRIURETIC PEPTIDE - Abnormal; Notable for the following:    Pro B Natriuretic peptide (BNP) 1786.0 (*)    All other components within normal limits  URINALYSIS, ROUTINE W REFLEX MICROSCOPIC - Abnormal; Notable for the following:    APPearance CLOUDY (*)    Protein, ur 30 (*)    Leukocytes, UA SMALL (*)    All other components within normal limits  URINE MICROSCOPIC-ADD ON - Abnormal; Notable for the following:    Squamous Epithelial / LPF MANY (*)    Casts HYALINE CASTS (*)    All other components within normal limits  URINE CULTURE  TROPONIN I   Dg Chest 2 View  07/12/2012   *RADIOLOGY REPORT*  Clinical Data: Chest pain  CHEST - 2 VIEW  Comparison: 06/26/2012  Findings: Cardiac shadow is stable.  The lungs are well aerated without focal infiltrate or sizable effusion.  Postsurgical changes are noted  in the cervical spine.  IMPRESSION: No acute abnormality noted.   Original Report Authenticated By: Alcide Clever, M.D.     1. Chest pain   2. Dyspnea on exertion   3. Acute on chronic diastolic heart failure      Date: 07/12/2012  Rate: 71  Rhythm: normal sinus rhythm  QRS Axis: normal  Intervals: normal  ST/T Wave abnormalities: normal  Conduction Disutrbances:none  Narrative Interpretation:   Old EKG Reviewed: none available     MDM   Discussed with Cardiology. Will see in ED and admit       Loren Racer, MD 07/12/12 1921

## 2012-07-12 NOTE — H&P (Signed)
History and Physical  Patient ID: Jordan Byrd MRN: [id number], SOB: [birth year] 79 y.o. Date of Encounter: 07/12/2012, 6:15 PM  Primary Physician: Enrique Sack, MD Primary Cardiologist: Hillis Range, M.D.  Chief Complaint: Shortness of breath  HPI: 79 y.o. female w/ PMHx significant for atrial fibrillation and diastolic heart failure who presented to [facility] on 07/12/2012 with complaints of shortness of breath and cough.  The patient has chronic lung disease. She is on home oxygen. She's had recent problems with atrial fibrillation and has been hospitalized now on a few occasions recently. She recently was placed on flecainide and underwent DC cardioversion. She's not been well for some time, but has experienced progressive dyspnea over the last 48-72 hours. She describes dyspnea with minimal exertion, nonproductive cough, orthopnea, and PND. She also complains of leg swelling, but this is improved with oral Lasix. She's had to sit on the side of the bed the last 2 nights and again this morning. She also complains of a pressure-like sensation in her chest. This is nonradiating. It is associated with her shortness of breath. It has been relieved with sitting upright.  She denies fever, chills, sputum production, headache, anorexia, or abdominal swelling.  She's been anticoagulated for her atrial fibrillation and she denies any bleeding problems. She specifically denies melena or hematochezia.  Labs are pertinent for a markedly elevated BNP. Her chest x-ray did not show evidence of congestive heart failure. EKG showed no acute changes. We're asked to see her for further cardiac evaluation.  Past Medical History  Diagnosis Date  . Asthma   . Shortness of breath   . Arthritis   . Anxiety   . Hypertension   . Atrial fibrillation     a. 04/04/11  . Osteoarthritis     a. 03/28/11 - R Total Hip Arthroplasty  . Cataracts, both eyes   . Pneumonia   . Bronchitis   . Pleurisy   .  Ankle swelling   . Kidney infection   . Frequent urination   . Excessive urination at night   . Chronic headaches   . Weight gain      Surgical History:  Past Surgical History  Procedure Laterality Date  . Cardiac catheterization      1980's or 90's - reportedly normal  . Abdominal hysterectomy  1962  . Appendectomy  1954  . Cholecystectomy    . Back surgery  1990  . Cataract extraction bilateral w/ anterior vitrectomy  2007/2008  . Total hip arthroplasty  03/28/2011    Procedure: TOTAL HIP ARTHROPLASTY;  Surgeon: Thera Flake., MD;  Location: [facility] OR;  Service: Orthopedics;  Laterality: Right;  . Cardioversion  04/05/2011    Procedure: CARDIOVERSION;  Surgeon: Gardiner Rhyme, MD;  Location: [facility] OR;  Service: Cardiovascular;  Laterality: N/A;  . Tonsillectomy  1943  . Adenoidectomy  1949  . Foot surgery  1947  . Thoracic disc surgery  2008  . Lumbar disc surgery  2010  . Knee surgery       both knees  . Shoulder surgery      left  . Total hip arthroplasty  03/28/2011    right  . Cardioversion N/A 06/19/2012    Procedure: CARDIOVERSION-bedside(3W36);  Surgeon: Lewayne Bunting, MD;  Location: [facility] OR;  Service: Cardiovascular;  Laterality: N/A;     Home Meds: Prior to Admission medications   Medication Sig Start Date End Date Taking? Authorizing Provider  acetaminophen (TYLENOL) 325 MG  tablet Take 650 mg by mouth 2 (two) times daily. For temp above 102   Yes Historical Provider, MD  amLODipine (NORVASC) 10 MG tablet Take 10 mg by mouth daily.   Yes Historical Provider, MD  benzonatate (TESSALON) 100 MG capsule Take 1 capsule (100 mg total) by mouth 3 (three) times daily as needed for cough. 06/19/12  Yes Marinda Elk, MD  calcium carbonate (OS-CAL) 600 MG TABS Take 600 mg by mouth 2 (two) times daily with a meal.   Yes Historical Provider, MD  chlorzoxazone (PARAFON) 500 MG tablet Take 250 mg by mouth 3 (three) times daily as needed for muscle spasms.   Yes Historical  Provider, MD  flecainide (TAMBOCOR) 100 MG tablet Take 1 tablet (100 mg total) by mouth every 12 (twelve) hours. 06/28/12  Yes Leroy Sea, MD  furosemide (LASIX) 20 MG tablet Take 20 mg by mouth daily.    Yes Historical Provider, MD  Glucosamine-Chondroit-Vit C-Mn (GLUCOSAMINE CHONDR 1500 COMPLX PO) Take 1 tablet by mouth daily.   Yes Historical Provider, MD  lisinopril (PRINIVIL,ZESTRIL) 40 MG tablet Take 40 mg by mouth daily.   Yes Historical Provider, MD  mometasone-formoterol (DULERA) 100-5 MCG/ACT AERO Inhale 2 puffs into the lungs 2 (two) times daily. 07/06/12  Yes Rosalio Macadamia, NP  Multiple Vitamin (MULITIVITAMIN WITH MINERALS) TABS Take 1 tablet by mouth daily.   Yes Historical Provider, MD  Multiple Vitamins-Minerals (ICAPS PO) Take 1 tablet by mouth 2 (two) times daily.   Yes Historical Provider, MD  nebivolol 20 MG TABS Take 1 tablet (20 mg total) by mouth daily. 06/28/12  Yes Leroy Sea, MD  omeprazole (PRILOSEC) 20 MG capsule Take 20 mg by mouth daily.   Yes Historical Provider, MD  Rivaroxaban (XARELTO) 20 MG TABS Take 1 tablet (20 mg total) by mouth daily. 06/19/12  Yes Marinda Elk, MD    Allergies:  Allergies  Allergen Reactions  . Aspirin Nausea And Vomiting  . Aspirin Buf(Alhyd-Mghyd-Cacar) Nausea And Vomiting  . Butazolidin (Phenylbutazone) Other (See Comments)    unknown  . Celebrex (Celecoxib) Other (See Comments)    unknown  . Codeine Nausea And Vomiting  . Doripenem Other (See Comments)    unknown  . Methocarbamol Hives  . Temazepam Other (See Comments)    unknown  . Vicodin (Hydrocodone-Acetaminophen)     Interacts with bp medication and drops blood pressure    History   Social History  . Marital Status: Widowed    Spouse Name: N/A    Number of Children: N/A  . Years of Education: N/A   Occupational History  . Retired    Social History Main Topics  . Smoking status: Former Smoker -- 21 years    Types: Cigarettes    Quit date:  03/17/1970  . Smokeless tobacco: Never Used  . Alcohol Use: No  . Drug Use: No  . Sexually Active: Not Currently   Other Topics Concern  . Not on file   Social History Narrative   Lives alone in [place].  Widowed in 2012 (husband w/ dementia & parkinsons)..   Not active, does not drive due to vision problems.     Family History  Problem Relation Age of Onset  . Diabetes Father     Father, Mother, 4 sisters (2 living)  . Heart attack Father     (Deceased)  . Heart failure Father     (deceased 88)  . Stroke Mother     (deceased  85)  . Cancer Sister     (deceased)  . Hypertension Father   . Hypertension Mother   . Heart attack Sister   . Diabetes Sister     Review of Systems: General: negative for chills, fever, night sweats or weight changes.  ENT: negative for rhinorrhea or epistaxis Cardiovascular: See history of present illness Dermatological: negative for rash Respiratory: negative for cough or wheezing GI: negative for nausea, vomiting, diarrhea, bright red blood per rectum, melena, or hematemesis GU: no hematuria, urgency, or frequency Neurologic: negative for visual changes, syncope, headache, or dizziness Heme: no easy bruising or bleeding Endo: negative for excessive thirst, thyroid disorder, or flushing Musculoskeletal: Positive for neck and back pain, negative for myalgias All other systems reviewed and are otherwise negative except as noted above.  Physical Exam: Blood pressure 137/56, pulse 71, temperature 98.7 F (37.1 C), temperature source Oral, resp. rate 21, SpO2 98.00%. General: Well developed, well nourished, alert and oriented, pleasant obese woman in no acute distress. She is comfortable at rest, but exhibits pursed lip breathing with minimal activity HEENT: Normocephalic, atraumatic, sclera non-icteric, no xanthomas, nares are without discharge.  Neck: Supple. Carotids 2+ without bruits. JVP elevated Lungs: No crackles, but there is expiratory  wheezing present  Heart: RRR with normal S1 and S2. No murmurs, rubs, or gallops appreciated. Abdomen: Soft, non-tender, non-distended with normoactive bowel sounds. No hepatomegaly. No rebound/guarding. No obvious abdominal masses. Back: No CVA tenderness Msk:  Strength and tone appear normal for age. Extremities: There is 1+ bilateral ankle edema.  Distal pedal pulses are 2+ and equal bilaterally. Neuro: CNII-XII intact, moves all extremities spontaneously. Psych:  Responds to questions appropriately with a normal affect.   Labs:   Lab Results  Component Value Date   WBC 8.3 07/12/2012   HGB 9.7* 07/12/2012   HCT 30.9* 07/12/2012   MCV 95.4 07/12/2012   PLT 247 07/12/2012    Recent Labs Lab 07/12/12 1524  NA 142  K 3.9  CL 100  CO2 32  BUN 29*  CREATININE 1.44*  CALCIUM 8.9  PROT 6.8  BILITOT 0.5  ALKPHOS 77  ALT 16  AST 11  GLUCOSE 113*    Recent Labs  07/12/12 1524  TROPONINI <0.30   Lab Results  Component Value Date   CHOL 204* 04/05/2011   HDL 26* 04/05/2011   LDLCALC 136* 04/05/2011   TRIG 211* 04/05/2011   No results found for this basename: DDIMER    Radiology/Studies:  Dg Chest 2 View  07/12/2012   *RADIOLOGY REPORT*  Clinical Data: Chest pain  CHEST - 2 VIEW  Comparison: 06/26/2012  Findings: Cardiac shadow is stable.  The lungs are well aerated without focal infiltrate or sizable effusion.  Postsurgical changes are noted in the cervical spine.  IMPRESSION: No acute abnormality noted.   Original Report Authenticated By: Alcide Clever, M.D.   2-D echocardiogram 05/08/2011: Study Conclusions  - Left ventricle: The cavity size was mildly dilated. Wall thickness was normal. Systolic function was low normal to mildly reduced. The estimated ejection fraction was in the range of 50% to 55%. Wall motion was normal; there were no regional wall motion abnormalities. Features are consistent with a pseudonormal left ventricular filling pattern, with concomitant abnormal  relaxation and increased filling pressure (grade 2 diastolic dysfunction). - Aortic valve: There was no stenosis. - Mitral valve: Trivial regurgitation. - Left atrium: The atrium was mildly dilated. - Right ventricle: The cavity size was normal. Systolic function was normal. -  Pulmonary arteries: PA peak pressure: 24mm Hg (S). - Inferior vena cava: The vessel was normal in size; the respirophasic diameter changes were in the normal range (= 50%); findings are consistent with normal central venous pressure. Impressions:  - Mildly dilated LV with low nomal to mildly reduced systolic function, EF 50-55%. Moderate diastolic dysfunction. Normal RV size and systolic function. No significant valvular abnormalities.   EKG: Normal sinus rhythm 71 beats per minute, nonspecific intraventricular conduction delay  ASSESSMENT AND PLAN:  1. Acute on chronic diastolic heart failure. The patient's symptoms and elevated BNP are consistent with congestive heart failure. She's had normal left ventricular systolic function in the past. Her exam is complicated by the presence of chronic obstructive lung disease. However, her markedly elevated BNP, paroxysmal nocturnal dyspnea, and leg swelling suggest heart failure is the primary etiology of her shortness of breath. Recommend IV diuresis with furosemide. Will place a Foley catheter as she is dyspneic with minimal activity. Will update her echocardiogram as it has been just over one year since her last study. Will follow her labs closely in the setting of chronic kidney disease. Will cycle enzymes sent she has experienced chest discomfort, but I suspect this is primarily related to elevated filling pressures and CHF.  2. Paroxysmal atrial fibrillation. The patient is in sinus rhythm today. She will continue on her medical program which includes flecainide and Xarelto.  3. Hypertension. Continue amlodipine, lisinopril, and nebivolol.  Disposition: Hospital  admission, update echo, await clinical response to diuresis  Signed, Tonny Bollman  07/12/2012, 6:15 PM

## 2012-07-13 DIAGNOSIS — I10 Essential (primary) hypertension: Secondary | ICD-10-CM

## 2012-07-13 DIAGNOSIS — I059 Rheumatic mitral valve disease, unspecified: Secondary | ICD-10-CM

## 2012-07-13 LAB — BASIC METABOLIC PANEL
BUN: 27 mg/dL — ABNORMAL HIGH (ref 6–23)
Chloride: 96 mEq/L (ref 96–112)
Glucose, Bld: 138 mg/dL — ABNORMAL HIGH (ref 70–99)
Potassium: 3.6 mEq/L (ref 3.5–5.1)

## 2012-07-13 LAB — TROPONIN I: Troponin I: <0.3 ng/mL (ref <0.30)

## 2012-07-13 MED ORDER — PERFLUTREN LIPID MICROSPHERE
1.0000 mL | INTRAVENOUS | Status: AC | PRN
  Administered 2012-07-13: 2 mL via INTRAVENOUS
  Filled 2012-07-13: qty 10

## 2012-07-13 MED ORDER — PROSIGHT PO TABS
1.0000 | ORAL_TABLET | Freq: Two times a day (BID) | ORAL | Status: DC
  Administered 2012-07-13 – 2012-07-16 (×6): 1 via ORAL
  Filled 2012-07-13 (×7): qty 1

## 2012-07-13 NOTE — Progress Notes (Signed)
Echocardiogram 2D Echocardiogram with Definity has been performed.  Kamala Kolton 07/13/2012, 12:26 PM

## 2012-07-13 NOTE — Progress Notes (Signed)
SUBJECTIVE: The patient is doing well today.  At this time, she denies chest pain, shortness of breath, or any new concerns.  She states that she is feeling much better today.  Chest pain resolved.  Breathing is much improved.    I/O -2.4L since admission   . amLODipine  10 mg Oral Daily  . calcium carbonate  1 tablet Oral BID WC  . flecainide  100 mg Oral Q12H  . furosemide  80 mg Intravenous BID  . lisinopril  40 mg Oral Daily  . mometasone-formoterol  2 puff Inhalation BID  . multivitamin-lutein  1 capsule Oral BID  . nebivolol  20 mg Oral Daily  . pantoprazole  40 mg Oral Daily  . potassium chloride  20 mEq Oral BID  . Rivaroxaban  20 mg Oral Q supper  . sodium chloride  3 mL Intravenous Q12H      OBJECTIVE: Physical Exam: Filed Vitals:   07/12/12 1945 07/12/12 2100 07/12/12 2322 07/13/12 0625  BP: 126/72 143/54  146/69  Pulse: 73 75  78  Temp:  98.9 F (37.2 C)  99.9 F (37.7 C)  TempSrc:    Oral  Resp:  18  18  Height:  5\' 6"  (1.676 m)    Weight:  253 lb 12 oz (115.1 kg)    SpO2: 97% 98% 93% 95%    Intake/Output Summary (Last 24 hours) at 07/13/12 1610 Last data filed at 07/13/12 0500  Gross per 24 hour  Intake      0 ml  Output   2450 ml  Net  -2450 ml    Telemetry reveals sinus rhythm  GEN- The patient is overweight appearing, alert and oriented x 3 today.   Head- normocephalic, atraumatic Eyes-  Sclera clear, conjunctiva pink Ears- hearing intact Oropharynx- clear Neck- supple,  Lungs- expiratory wheezes Heart- Regular rate and rhythm  GI- soft, NT, ND, + BS Extremities- no clubbing, cyanosis, + edema Skin- no rash or lesion Psych- euthymic mood, full affect Neuro- strength and sensation are intact  LABS: Basic Metabolic Panel:  Recent Labs  96/04/54 1524 07/12/12 2048 07/13/12 0236  NA 142  --  138  K 3.9  --  3.6  CL 100  --  96  CO2 32  --  33*  GLUCOSE 113*  --  138*  BUN 29*  --  27*  CREATININE 1.44* 1.44* 1.39*  CALCIUM  8.9  --  9.1   Liver Function Tests:  Recent Labs  07/12/12 1524  AST 11  ALT 16  ALKPHOS 77  BILITOT 0.5  PROT 6.8  ALBUMIN 3.3*   CBC:  Recent Labs  07/12/12 1524 07/12/12 2048  WBC 8.3 8.5  NEUTROABS 6.4  --   HGB 9.7* 9.6*  HCT 30.9* 30.0*  MCV 95.4 95.8  PLT 247 254   Cardiac Enzymes:  Recent Labs  07/12/12 1524 07/12/12 2053 07/13/12 0236  TROPONINI <0.30 <0.30 <0.30   RADIOLOGY: Dg Chest 2 View 07/12/2012   *RADIOLOGY REPORT*  Clinical Data: Chest pain  CHEST - 2 VIEW  Comparison: 06/26/2012  Findings: Cardiac shadow is stable.  The lungs are well aerated without focal infiltrate or sizable effusion.  Postsurgical changes are noted in the cervical spine.  IMPRESSION: No acute abnormality noted.   Original Report Authenticated By: Alcide Clever, M.D.    1. Acute chf Echo pending Continue IV diuresis  2. afib Maintaining sinus rhythm Continue xarelto and flecainide  3. Chronic lung disease Stable  on O2  4. HTN Stable No change required today

## 2012-07-14 DIAGNOSIS — N179 Acute kidney failure, unspecified: Secondary | ICD-10-CM

## 2012-07-14 LAB — BASIC METABOLIC PANEL
Chloride: 97 mEq/L (ref 96–112)
GFR calc Af Amer: 29 mL/min — ABNORMAL LOW (ref >90)
Potassium: 4 mEq/L (ref 3.5–5.1)
Sodium: 142 mEq/L (ref 135–145)

## 2012-07-14 LAB — URINE CULTURE

## 2012-07-14 NOTE — Care Management Note (Signed)
    Page 1 of 1   07/16/2012     12:36:57 PM   CARE MANAGEMENT NOTE 07/16/2012  Patient:  Jordan Byrd, Jordan Byrd   Account Number:  [id number]  Date Initiated:  07/14/2012  Documentation initiated by:  GRAVES-BIGELOW,Marce Schartz  Subjective/Objective Assessment:   Pt admitted with Shortness of breath. Pt is from home alone. She has 02 at home via [facility]. Pt will benefit from [facility] services possible PT.     Action/Plan:   Pt is agreeable to [facility] services via [facility]. MD please write orders and then referral will be made for services.   Anticipated DC Date:  07/16/2012   Anticipated DC Plan:  HOME W HOME HEALTH SERVICES      DC Planning Services  CM consult      Choice offered to / List presented to:             Status of service:  Completed, signed off Medicare Important Message given?   (If response is "NO", the following Medicare IM given date fields will be blank) Date Medicare IM given:   Date Additional Medicare IM given:    Discharge Disposition:  HOME/SELF CARE  Per UR Regulation:  Reviewed for med. necessity/level of care/duration of stay  If discussed at Long Length of Stay Meetings, dates discussed:    Comments:  07-16-12 1235 Tomi Bamberger, RN, BSN [phone] Pt was d/c before RN Care Manager was able to see. RN stated that pt is now refusing [facility] services.No orders written for [facility].  No further needs from CM at this time.

## 2012-07-14 NOTE — Progress Notes (Signed)
SUBJECTIVE: The patient is doing well today.  At this time, she denies chest pain, shortness of breath, or any new concerns.  I/O -3.8L yesterday, but weight up one pound  Echo yesterday with normal EF  . amLODipine  10 mg Oral Daily  . calcium carbonate  1 tablet Oral BID WC  . flecainide  100 mg Oral Q12H  . furosemide  80 mg Intravenous BID  . lisinopril  40 mg Oral Daily  . mometasone-formoterol  2 puff Inhalation BID  . multivitamin  1 tablet Oral BID  . nebivolol  20 mg Oral Daily  . pantoprazole  40 mg Oral Daily  . potassium chloride  20 mEq Oral BID  . Rivaroxaban  20 mg Oral Q supper  . sodium chloride  3 mL Intravenous Q12H      OBJECTIVE: Physical Exam: Filed Vitals:   07/13/12 0625 07/13/12 2053 07/13/12 2100 07/14/12 0300  BP: 146/69  131/53 156/68  Pulse: 78  76 72  Temp: 99.9 F (37.7 C)  99.3 F (37.4 C) 98.3 F (36.8 C)  TempSrc: Oral  Oral Oral  Resp: 18  20   Height:      Weight:    254 lb 13.6 oz (115.6 kg)  SpO2: 95% 95% 94% 94%    Intake/Output Summary (Last 24 hours) at 07/14/12 0644 Last data filed at 07/14/12 0300  Gross per 24 hour  Intake    720 ml  Output   2150 ml  Net  -1430 ml    Telemetry reveals sinus rhythm  GEN- The patient is well appearing, alert and oriented x 3 today.   Head- normocephalic, atraumatic Eyes-  Sclera clear, conjunctiva pink Ears- hearing intact Oropharynx- clear Neck- supple, no JVP Lymph- no cervical lymphadenopathy Lungs- few expiratory wheezes Heart- Regular rate and rhythm, no murmurs, rubs or gallops, PMI not laterally displaced GI- soft, NT, ND, + BS Extremities- no clubbing, cyanosis, or edema Skin- no rash or lesion Psych- euthymic mood, full affect Neuro- strength and sensation are intact  LABS: Basic Metabolic Panel:  Recent Labs  40/98/11 0236 07/14/12 0442  NA 138 142  K 3.6 4.0  CL 96 97  CO2 33* 35*  GLUCOSE 138* 107*  BUN 27* 34*  CREATININE 1.39* 1.87*  CALCIUM 9.1  8.6   Liver Function Tests:  Recent Labs  07/12/12 1524  AST 11  ALT 16  ALKPHOS 77  BILITOT 0.5  PROT 6.8  ALBUMIN 3.3*   CBC:  Recent Labs  07/12/12 1524 07/12/12 2048  WBC 8.3 8.5  NEUTROABS 6.4  --   HGB 9.7* 9.6*  HCT 30.9* 30.0*  MCV 95.4 95.8  PLT 247 254   Cardiac Enzymes:  Recent Labs  07/12/12 2053 07/13/12 0236 07/13/12 0850  TROPONINI <0.30 <0.30 <0.30    RADIOLOGY: Dg Chest 2 View 07/12/2012   *RADIOLOGY REPORT*  Clinical Data: Chest pain  CHEST - 2 VIEW  Comparison: 06/26/2012  Findings: Cardiac shadow is stable.  The lungs are well aerated without focal infiltrate or sizable effusion.  Postsurgical changes are noted in the cervical spine.  IMPRESSION: No acute abnormality noted.   Original Report Authenticated By: Alcide Clever, M.D.   ASSESSMENT AND PLAN:  Active Problems:   Acute on chronic diastolic heart failure 1. Acute on chronic diastolic dysfunction Renal function decreased Hold diuresis today and follow  2. afib Maintaining sinus rhythm  3. Acute renal failure Hold lasix If not improved tomorrow, hold ace inhibitor  May have to adjust xarelto if creatinine clearance worsens  4. Chronic lung disease Stable  Needs to ambulate PT to assess and also assess O2 requirement with ambulation  Hopefully home in the next 1-2 days  Hillis Range, MD 07/14/2012 6:44 AM

## 2012-07-15 LAB — BASIC METABOLIC PANEL
CO2: 33 mEq/L — ABNORMAL HIGH (ref 19–32)
Calcium: 9 mg/dL (ref 8.4–10.5)
Chloride: 100 mEq/L (ref 96–112)
Sodium: 141 mEq/L (ref 135–145)

## 2012-07-15 NOTE — Progress Notes (Signed)
PT Evaluation performed by Physical therapist Hazle Quant.  Data pulled into this note as a late entry.     07/14/12 1000  PT Visit Information  Last PT Received On 07/14/12  Assistance Needed +1  PT Time Calculation  PT Start Time 1010  PT Stop Time 1041  PT Time Calculation (min) 31 min  Subjective Data  Subjective I do alright when i feel good  Patient Stated Goal home, no back pain  Precautions  Precautions Fall  Precaution Comments back pain, leg weakness, reaches for walls, occassionally uses amb device  Cognition  Arousal/Alertness Awake/alert  Behavior During Therapy WFL for tasks assessed/performed  Overall Cognitive Status Within Functional Limits for tasks assessed  Bed Mobility  Bed Mobility Supine to Sit;Sitting - Scoot to Edge of Bed  Supine to Sit 6: Modified independent (Device/Increase time);With rails  Sitting - Scoot to Edge of Bed 6: Modified independent (Device/Increase time);With rail  Details for Bed Mobility Assistance No cues or assist needed  Transfers  Transfers Sit to Stand;Stand to Sit  Sit to Stand 5: Supervision;From bed;From chair/3-in-1;With armrests  Stand to Sit With upper extremity assist;To bed;5: Supervision;To chair/3-in-1  Details for Transfer Assistance instruction for stand strong, sit slow to improve strength and muscular control; cues to monitor for dizziness/lightheadedness  Ambulation/Gait  Ambulation/Gait Assistance 4: Min assist  Ambulation Distance (Feet) 50 Feet  Assistive device 1 person hand held assist  Ambulation/Gait Assistance Details physical assistance to prevent loss of balance, pt reaching for walls and carts to steady; limited by back pain per report.  Recommend use of device   Gait Pattern Decreased stride length;Antalgic  General Gait Details rapid increase WOB; Ox dropped to <87% on RA with walking; unable to incorporate pursed lip breathing consitently to control dyspnea despite freq cues; admits to avoiding oxygen use  at home 'if I don't need it' -- discussed risk of hypoxia and need for Ox at least currently.  Exercises  Exercises General Lower Extremity  General Exercises - Lower Extremity  Ankle Circles/Pumps AROM;Both;10 reps;Seated (instr: perform 10 q/hour)  Gluteal Sets AROM;Both;10 reps;Seated  Long Arc Quad AROM;Both;5 reps;10 reps;Seated  Mini-Sqauts AROM;Both;5 reps;Seated (inst: stand strong/sit slow perform 5reps 2x/day)  PT - End of Session  Equipment Utilized During Treatment Gait belt  Activity Tolerance Patient limited by fatigue;Patient limited by pain  Patient left in chair;with call bell/phone within reach;with nursing in room  Nurse Communication Mobility status;Patient requests pain meds  PT - Assessment/Plan  PT Frequency Min 3X/week  Follow Up Recommendations Home health PT;Supervision - Intermittent  PT equipment None recommended by PT  Acute Rehab PT Goals  PT Goal Formulation With patient  Time For Goal Achievement 07/28/12  Potential to Achieve Goals Good  Pt will go Sit to Stand with modified independence  PT Goal: Sit to Stand - Progress Goal set today  Pt will go Stand to Sit with modified independence  PT Goal: Stand to Sit - Progress Goal set today  Pt will Ambulate 51 - 150 feet;with modified independence;with least restrictive assistive device (using pursed lip breathing for dyspnea)  PT Goal: Ambulate - Progress Goal set today  PT General Charges  $$ ACUTE PT VISIT 1 Procedure  PT Evaluation  $Initial PT Evaluation Tier I 1 Procedure  PT Treatments  $Gait Training 8-22 mins  $Therapeutic Exercise 8-22 mins

## 2012-07-15 NOTE — Progress Notes (Signed)
   SUBJECTIVE: The patient is doing well today.  At this time, she denies chest pain, shortness of breath, or any new concerns.  She feels that she is doing much better.  Marland Kitchen amLODipine  10 mg Oral Daily  . calcium carbonate  1 tablet Oral BID WC  . flecainide  100 mg Oral Q12H  . lisinopril  40 mg Oral Daily  . mometasone-formoterol  2 puff Inhalation BID  . multivitamin  1 tablet Oral BID  . nebivolol  20 mg Oral Daily  . pantoprazole  40 mg Oral Daily  . potassium chloride  20 mEq Oral BID  . Rivaroxaban  20 mg Oral Q supper  . sodium chloride  3 mL Intravenous Q12H      OBJECTIVE: Physical Exam: Filed Vitals:   07/14/12 1031 07/14/12 1349 07/14/12 2022 07/14/12 2100  BP: 133/47 105/41  119/50  Pulse:  59  65  Temp:  98.2 F (36.8 C)  97.4 F (36.3 C)  TempSrc:  Oral  Oral  Resp:  18  18  Height:      Weight:      SpO2:  98% 97% 97%    Intake/Output Summary (Last 24 hours) at 07/15/12 9528 Last data filed at 07/15/12 0600  Gross per 24 hour  Intake   1080 ml  Output   1200 ml  Net   -120 ml    Telemetry reveals sinus rhythm  GEN- The patient is well appearing, alert and oriented x 3 today.   Head- normocephalic, atraumatic Eyes-  Sclera clear, conjunctiva pink Ears- hearing intact Oropharynx- clear Neck- supple, no JVP Lymph- no cervical lymphadenopathy Lungs- clear Heart- Regular rate and rhythm, no murmurs, rubs or gallops, PMI not laterally displaced GI- soft, NT, ND, + BS Extremities- no clubbing, cyanosis, or edema Neuro- strength and sensation are intact  LABS: Basic Metabolic Panel:  Recent Labs  41/32/44 0442 07/15/12 0545  NA 142 141  K 4.0 4.2  CL 97 100  CO2 35* 33*  GLUCOSE 107* 106*  BUN 34* 38*  CREATININE 1.87* 1.53*  CALCIUM 8.6 9.0   Liver Function Tests:  Recent Labs  07/12/12 1524  AST 11  ALT 16  ALKPHOS 77  BILITOT 0.5  PROT 6.8  ALBUMIN 3.3*   CBC:  Recent Labs  07/12/12 1524 07/12/12 2048  WBC 8.3 8.5   NEUTROABS 6.4  --   HGB 9.7* 9.6*  HCT 30.9* 30.0*  MCV 95.4 95.8  PLT 247 254   Cardiac Enzymes:  Recent Labs  07/12/12 2053 07/13/12 0236 07/13/12 0850  TROPONINI <0.30 <0.30 <0.30    RADIOLOGY: Dg Chest 2 View 07/12/2012   *RADIOLOGY REPORT*  Clinical Data: Chest pain  CHEST - 2 VIEW  Comparison: 06/26/2012  Findings: Cardiac shadow is stable.  The lungs are well aerated without focal infiltrate or sizable effusion.  Postsurgical changes are noted in the cervical spine.  IMPRESSION: No acute abnormality noted.   Original Report Authenticated By: Alcide Clever, M.D.   ASSESSMENT AND PLAN:  Active Problems:   Acute on chronic diastolic heart failure 1. Acute on chronic diastolic dysfunction Renal function decreased Hold diuresis today and restart oral (home dose) tomorrow  2. afib Maintaining sinus rhythm  3. Acute renal failure Hold lasix for one more day  4. Chronic lung disease Stable  Needs to ambulate PT to assess and also assess O2 requirement with ambulation  Hopefully home tomorrow  Hillis Range, MD 07/15/2012 8:21 AM

## 2012-07-15 NOTE — Progress Notes (Signed)
Physical Therapy Treatment Patient Details Name: Jordan Byrd MRN: [id number] DOB: 25-Aug-1933 Today's Date: 07/15/2012 Time: 1010-1027 PT Time Calculation (min): 17 min  PT Assessment / Plan / Recommendation Comments on Treatment Session  Pt is 79 yo female with recent hospitalizations (3 in past month) who presents for exacerbation of CHF.  Patient demonstrated deficits in functional mobility upon evaluation yesterday as indicated.  Today, Pt demonstrates progress towards PT goals at this time. Increased ambulation today with use of rw.  Will continue to see and progress activity as tolerated.    Follow Up Recommendations  Home health PT;Supervision - Intermittent     Does the patient have the potential to tolerate intense rehabilitation     Barriers to Discharge        Equipment Recommendations  None recommended by PT    Recommendations for Other Services    Frequency Min 3X/week   Plan Discharge plan remains appropriate    Precautions / Restrictions Precautions Precautions: Fall Precaution Comments: back pain, leg weakness, reaches for walls, occassionally uses amb device   Pertinent Vitals/Pain Pt reports no pain at this time, Vitals stable on 2 liters O2    Mobility  Bed Mobility Bed Mobility: Supine to Sit;Sitting - Scoot to Edge of Bed Supine to Sit: 6: Modified independent (Device/Increase time);With rails Sitting - Scoot to Edge of Bed: 6: Modified independent (Device/Increase time);With rail Details for Bed Mobility Assistance: No cues or assist needed Transfers Transfers: Sit to Stand;Stand to Sit Sit to Stand: 5: Supervision;From bed;From chair/3-in-1;With armrests Stand to Sit: With upper extremity assist;To bed;5: Supervision;To chair/3-in-1 Details for Transfer Assistance: instruction for stand strong, sit slow to improve strength and muscular control; cues to monitor for dizziness/lightheadedness Ambulation/Gait Ambulation/Gait Assistance: 5:  Supervision Ambulation Distance (Feet): 210 Feet Assistive device: Rolling walker Ambulation/Gait Assistance Details: 2 standing rest breaks  Gait Pattern: Decreased stride length;Antalgic Gait velocity: decreased General Gait Details: Overall good stability until patient began to navigate in room around obstacles. Patient was very impulsive and trying to push through obstacles in a hurry to get to chair.  Multi-modal cues for safety and control in restricted environment. Stairs: No      PT Goals Acute Rehab PT Goals PT Goal Formulation: With patient Time For Goal Achievement: 07/28/12 Potential to Achieve Goals: Good Pt will go Sit to Stand: with modified independence PT Goal: Sit to Stand - Progress: Progressing toward goal Pt will go Stand to Sit: with modified independence PT Goal: Stand to Sit - Progress: Progressing toward goal Pt will Ambulate: 51 - 150 feet;with modified independence;with least restrictive assistive device (using pursed lip breathing for dyspnea) PT Goal: Ambulate - Progress: Progressing toward goal  Visit Information  Last PT Received On: 07/15/12 Assistance Needed: +1    Subjective Data  Subjective: I am waiting for a very important call  Patient Stated Goal: home, no back pain   Cognition  Cognition Arousal/Alertness: Awake/alert Behavior During Therapy: WFL for tasks assessed/performed Overall Cognitive Status: Within Functional Limits for tasks assessed    Balance  Static Standing Balance Static Standing - Balance Support: Bilateral upper extremity supported;During functional activity Static Standing - Level of Assistance: 6: Modified independent (Device/Increase time) Static Standing - Comment/# of Minutes: 3 minutes for resting breaks  End of Session PT - End of Session Equipment Utilized During Treatment: Gait belt Activity Tolerance: Other (comment) (pt received call that she needed to take, ending session) Patient left: in chair;with  call bell/phone within reach Nurse  Communication: Mobility status;Patient requests pain meds   GP     Fabio Asa 07/15/2012, 11:54 AM Charlotte Crumb, PT DPT  [phone]

## 2012-07-16 LAB — BASIC METABOLIC PANEL
CO2: 33 mEq/L — ABNORMAL HIGH (ref 19–32)
GFR calc non Af Amer: 34 mL/min — ABNORMAL LOW (ref >90)
Glucose, Bld: 93 mg/dL (ref 70–99)
Potassium: 4.2 mEq/L (ref 3.5–5.1)
Sodium: 139 mEq/L (ref 135–145)

## 2012-07-16 MED ORDER — FUROSEMIDE 20 MG PO TABS: 20.0000 mg | ORAL_TABLET | Freq: Every day | ORAL | Status: DC

## 2012-07-16 MED ORDER — FUROSEMIDE 20 MG PO TABS: 40.0000 mg | ORAL_TABLET | Freq: Every day | ORAL | Status: DC

## 2012-07-16 NOTE — Discharge Summary (Signed)
CARDIOLOGY DISCHARGE SUMMARY    Patient ID: Jordan Byrd,  MRN: [id number], DOB/AGE: 77-01-1934 77 y.o.  Admit date: 07/12/2012 Discharge date: 07/16/2012  Primary Care Physician: Jordan Milks, MD Primary Cardiologist: Jordan Range, MD  Primary Discharge Diagnosis:  1. Acute on chronic diastolic HF 2. Acute renal failure, improved  Secondary Discharge Diagnoses:  1. PAF 2. Asthma 2. HTN 4. Osteoarthritis  Procedures This Admission:  1. 2D echo 07/13/2012 Left ventricle: The cavity size was mildly dilated. Wall thickness was normal. Systolic function was normal. The estimated ejection fraction was in the Byrd of 55% to 60%. Early diastolic septal annular tissue Doppler velocities Ea were abnormal. ------------------------------------------------------------ Aortic valve: Structurally normal valve. Cusp separation was normal. Doppler: Transvalvular velocity was within the normal Byrd. There was no stenosis. No regurgitation. ------------------------------------------------------------ Aorta: Aortic root: The aortic root was normal in size. Ascending aorta: The ascending aorta was normal in size. ------------------------------------------------------------ Mitral valve: Structurally normal valve. Leaflet separation was normal. Doppler: Transvalvular velocity was within the normal Byrd. There was no evidence for stenosis. Mild regurgitation. Peak gradient: 5mm Hg (D). ------------------------------------------------------------ Left atrium: The atrium was mildly dilated. ------------------------------------------------------------ Right ventricle: The cavity size was normal. Systolic function was normal. ------------------------------------------------------------ Pulmonic valve: Doppler: No significant regurgitation. ------------------------------------------------------------ Tricuspid valve: Doppler: No significant  regurgitation. ------------------------------------------------------------ Right atrium: The atrium was normal in size. ------------------------------------------------------------ Pericardium: There was no pericardial effusion. ------------------------------------------------------------ Systemic veins: Inferior vena cava: The vessel was normal in size; the respirophasic diameter changes were in the normal Byrd (=50%); findings are consistent with normal central venous pressure.  History and Hospital Course:  [name] is a 79 year old woman with chronic diastolic HF and PAF who presented on 07/12/2012 with increased DOE, orthopnea and PND, found to have acute on chronic diastolic HF. Her echo was updated while here with results as outlined above. She was diuresed with IV Lasix with good response. Her SOB improved. However, she developed worsening renal function and Lasix was held x 2 days. Today her BMET shows improved renal function. She remains in SR on telemetry. She is feeling much better and is eager to go home. Of note, she was evaluated by PT but she declined Home Health services. She has been seen, examined and deemed stable for discharge home today by Dr. Hillis Byrd. She will follow-up as scheduled with Jordan Fredrickson, NP on 07/26/2012 with repeat BMET.    Discharge Vitals: Blood pressure 130/41, pulse 62, temperature 97.6 F (36.4 C), temperature source Oral, resp. rate 20, height 5\' 6"  (1.676 m), weight 249 lb 9 oz (113.2 kg), SpO2 98.00%.   Labs: Lab Results  Component Value Date   WBC 8.5 07/12/2012   HGB 9.6* 07/12/2012   HCT 30.0* 07/12/2012   MCV 95.8 07/12/2012   PLT 254 07/12/2012    Recent Labs Lab 07/12/12 1524  07/16/12 0335  NA 142  < > 139  K 3.9  < > 4.2  CL 100  < > 100  CO2 32  < > 33*  BUN 29*  < > 34*  CREATININE 1.44*  < > 1.46*  CALCIUM 8.9  < > 9.0  PROT 6.8  --   --   BILITOT 0.5  --   --   ALKPHOS 77  --   --   ALT 16  --   --   AST 11  --   --    GLUCOSE 113*  < > 93  < > = values in this  interval not displayed. Lab Results  Component Value Date   CKTOTAL 59 04/05/2011   CKMB 2.0 04/05/2011   TROPONINI <0.30 07/13/2012     Disposition:  The patient is being discharged in stable condition.  Follow-up:     Follow-up Information   Follow up with Jordan Fredrickson, NP On 07/26/2012. (At 9:00 AM)    Contact information:   [address] [place] [address] [phone]     Discharge Medications:    Medication List    TAKE these medications       acetaminophen 325 MG tablet  Commonly known as:  TYLENOL  Take 650 mg by mouth 2 (two) times daily. For temp above 102     amLODipine 10 MG tablet  Commonly known as:  NORVASC  Take 10 mg by mouth daily.     benzonatate 100 MG capsule  Commonly known as:  TESSALON  Take 1 capsule (100 mg total) by mouth 3 (three) times daily as needed for cough.     calcium carbonate 600 MG Tabs  Commonly known as:  OS-CAL  Take 600 mg by mouth 2 (two) times daily with a meal.     chlorzoxazone 500 MG tablet  Commonly known as:  PARAFON  Take 250 mg by mouth 3 (three) times daily as needed for muscle spasms.     flecainide 100 MG tablet  Commonly known as:  TAMBOCOR  Take 1 tablet (100 mg total) by mouth every 12 (twelve) hours.     furosemide 20 MG tablet  Commonly known as:  LASIX  Take 2 tablets (40 mg total) by mouth daily. HOLD TODAY. Resume tomorrow 07/17/2012.     GLUCOSAMINE CHONDR 1500 COMPLX PO  Take 1 tablet by mouth daily.     ICAPS PO  Take 1 tablet by mouth 2 (two) times daily.     lisinopril 40 MG tablet  Commonly known as:  PRINIVIL,ZESTRIL  Take 40 mg by mouth daily.     mometasone-formoterol 100-5 MCG/ACT Aero  Commonly known as:  DULERA  Inhale 2 puffs into the lungs 2 (two) times daily.     multivitamin with minerals Tabs  Take 1 tablet by mouth daily.     Nebivolol HCl 20 MG Tabs  Take 1 tablet (20 mg total) by mouth daily.      omeprazole 20 MG capsule  Commonly known as:  PRILOSEC  Take 20 mg by mouth daily.     Rivaroxaban 20 MG Tabs  Commonly known as:  XARELTO  Take 1 tablet (20 mg total) by mouth daily.       Duration of Discharge Encounter: Greater than 30 minutes including physician time.  Jordan Patricia, PA-C 07/16/2012, 11:32 AM    Jordan Range MD

## 2012-07-16 NOTE — Progress Notes (Signed)
Spoke with MD Excell Seltzer about pt pulling out her IV & he said it would be okay to leave out since we're anticipating discharge. Will obtain IV access if pt is not discharged today. Sanda Linger

## 2012-07-16 NOTE — Progress Notes (Signed)
   SUBJECTIVE: The patient is doing well today.  At this time, she denies chest pain, shortness of breath, or any new concerns. She states that she feels "great" and is ready to go home. PT notes reviewed.  Marland Kitchen amLODipine  10 mg Oral Daily  . calcium carbonate  1 tablet Oral BID WC  . flecainide  100 mg Oral Q12H  . lisinopril  40 mg Oral Daily  . mometasone-formoterol  2 puff Inhalation BID  . multivitamin  1 tablet Oral BID  . nebivolol  20 mg Oral Daily  . pantoprazole  40 mg Oral Daily  . potassium chloride  20 mEq Oral BID  . Rivaroxaban  20 mg Oral Q supper  . sodium chloride  3 mL Intravenous Q12H      OBJECTIVE: Physical Exam: Filed Vitals:   07/15/12 1333 07/15/12 1940 07/15/12 2006 07/16/12 0531  BP: 130/47  140/60 130/41  Pulse: 62  65 62  Temp: 97.8 F (36.6 C)  98.4 F (36.9 C) 97.6 F (36.4 C)  TempSrc: Oral  Oral Oral  Resp: 20     Height:      Weight:    249 lb 9 oz (113.2 kg)  SpO2: 98% 96% 98% 98%    Intake/Output Summary (Last 24 hours) at 07/16/12 0835 Last data filed at 07/15/12 2259  Gross per 24 hour  Intake    843 ml  Output    300 ml  Net    543 ml    Telemetry reveals sinus rhythm  GEN- The patient is well appearing, alert and oriented x 3 today.   Head- normocephalic, atraumatic Eyes-  Sclera clear, conjunctiva pink Ears- hearing intact Oropharynx- clear Neck- supple, no JVP Lymph- no cervical lymphadenopathy Lungs- few expiratory wheezes Heart- Regular rate and rhythm, no murmurs, rubs or gallops, PMI not laterally displaced GI- soft, NT, ND, + BS Extremities- no clubbing, cyanosis, or edema Neuro- strength and sensation are intact  LABS: Basic Metabolic Panel:  Recent Labs  40/98/11 0545 07/16/12 0335  NA 141 139  K 4.2 4.2  CL 100 100  CO2 33* 33*  GLUCOSE 106* 93  BUN 38* 34*  CREATININE 1.53* 1.46*  CALCIUM 9.0 9.0   Liver Function Tests: No results found for this basename: AST, ALT, ALKPHOS, BILITOT, PROT,  ALBUMIN,  in the last 72 hours CBC: No results found for this basename: WBC, NEUTROABS, HGB, HCT, MCV, PLT,  in the last 72 hours Cardiac Enzymes:  Recent Labs  07/13/12 0850  TROPONINI <0.30    RADIOLOGY: Dg Chest 2 View 07/12/2012   *RADIOLOGY REPORT*  Clinical Data: Chest pain  CHEST - 2 VIEW  Comparison: 06/26/2012  Findings: Cardiac shadow is stable.  The lungs are well aerated without focal infiltrate or sizable effusion.  Postsurgical changes are noted in the cervical spine.  IMPRESSION: No acute abnormality noted.   Original Report Authenticated By: Alcide Clever, M.D.   ASSESSMENT AND PLAN:  Active Problems:   Acute on chronic diastolic heart failure 1. Acute on chronic diastolic dysfunction Renal function decreased Hold diuresis today and restart lasix 40mg  daily tomorrow  2. afib Maintaining sinus rhythm  3. Acute renal failure Improved Repeat bmet when he sees [facility] 07/26/12  4. Chronic lung disease Stable  PT note reviewed She declines HHPT  DC to home today She already has scheduled follow-up with Lawson Fiscal 6/23  Hillis Range, MD 07/16/2012 8:35 AM

## 2012-07-19 ENCOUNTER — Other Ambulatory Visit: Payer: Self-pay | Admitting: *Deleted

## 2012-07-19 MED ORDER — RIVAROXABAN 20 MG PO TABS: 20.0000 mg | ORAL_TABLET | Freq: Every day | ORAL | Status: DC

## 2012-07-20 ENCOUNTER — Other Ambulatory Visit: Payer: Self-pay | Admitting: *Deleted

## 2012-07-20 MED ORDER — RIVAROXABAN 20 MG PO TABS: 20.0000 mg | ORAL_TABLET | Freq: Every day | ORAL | Status: DC

## 2012-07-26 ENCOUNTER — Encounter: Payer: Self-pay | Admitting: Nurse Practitioner

## 2012-07-26 ENCOUNTER — Ambulatory Visit (INDEPENDENT_AMBULATORY_CARE_PROVIDER_SITE_OTHER): Payer: Medicare Other | Admitting: Nurse Practitioner

## 2012-07-26 ENCOUNTER — Other Ambulatory Visit: Payer: Self-pay

## 2012-07-26 VITALS — BP 126/70 | HR 60 | Ht 66.0 in | Wt 241.0 lb

## 2012-07-26 DIAGNOSIS — I4891 Unspecified atrial fibrillation: Secondary | ICD-10-CM

## 2012-07-26 DIAGNOSIS — I10 Essential (primary) hypertension: Secondary | ICD-10-CM

## 2012-07-26 DIAGNOSIS — R0600 Dyspnea, unspecified: Secondary | ICD-10-CM

## 2012-07-26 DIAGNOSIS — I480 Paroxysmal atrial fibrillation: Secondary | ICD-10-CM

## 2012-07-26 DIAGNOSIS — I5032 Chronic diastolic (congestive) heart failure: Secondary | ICD-10-CM

## 2012-07-26 DIAGNOSIS — R0609 Other forms of dyspnea: Secondary | ICD-10-CM

## 2012-07-26 DIAGNOSIS — R0989 Other specified symptoms and signs involving the circulatory and respiratory systems: Secondary | ICD-10-CM

## 2012-07-26 LAB — CBC WITH DIFFERENTIAL/PLATELET
Basophils Absolute: 0.1 10*3/uL (ref 0.0–0.1)
Basophils Relative: 0.6 % (ref 0.0–3.0)
Eosinophils Absolute: 0.2 10*3/uL (ref 0.0–0.7)
Eosinophils Relative: 1.7 % (ref 0.0–5.0)
HCT: 34 % — ABNORMAL LOW (ref 36.0–46.0)
Hemoglobin: 11.1 g/dL — ABNORMAL LOW (ref 12.0–15.0)
Lymphocytes Relative: 15 % (ref 12.0–46.0)
Lymphs Abs: 1.4 10*3/uL (ref 0.7–4.0)
MCHC: 32.5 g/dL (ref 30.0–36.0)
MCV: 94.2 fl (ref 78.0–100.0)
Monocytes Absolute: 0.7 10*3/uL (ref 0.1–1.0)
Monocytes Relative: 8 % (ref 3.0–12.0)
Neutro Abs: 6.9 10*3/uL (ref 1.4–7.7)
Neutrophils Relative %: 74.7 % (ref 43.0–77.0)
Platelets: 462 10*3/uL — ABNORMAL HIGH (ref 150.0–400.0)
RBC: 3.61 Mil/uL — ABNORMAL LOW (ref 3.87–5.11)
RDW: 15.6 % — ABNORMAL HIGH (ref 11.5–14.6)
WBC: 9.2 10*3/uL (ref 4.5–10.5)

## 2012-07-26 LAB — BASIC METABOLIC PANEL
BUN: 47 mg/dL — ABNORMAL HIGH (ref 6–23)
CO2: 30 mEq/L (ref 19–32)
Calcium: 9.8 mg/dL (ref 8.4–10.5)
Chloride: 94 mEq/L — ABNORMAL LOW (ref 96–112)
Creatinine, Ser: 2.6 mg/dL — ABNORMAL HIGH (ref 0.4–1.2)
GFR: 19.4 mL/min — ABNORMAL LOW (ref >60.00)
Glucose, Bld: 113 mg/dL — ABNORMAL HIGH (ref 70–99)
Potassium: 4.7 mEq/L (ref 3.5–5.1)
Sodium: 137 mEq/L (ref 135–145)

## 2012-07-26 MED ORDER — RIVAROXABAN 20 MG PO TABS: 20.0000 mg | ORAL_TABLET | Freq: Every day | ORAL | Status: DC

## 2012-07-26 MED ORDER — FLECAINIDE ACETATE 100 MG PO TABS: 100.0000 mg | ORAL_TABLET | Freq: Two times a day (BID) | ORAL | Status: DC

## 2012-07-26 MED ORDER — NEBIVOLOL HCL 20 MG PO TABS: 20.0000 mg | ORAL_TABLET | Freq: Every day | ORAL | Status: DC

## 2012-07-26 MED ORDER — FUROSEMIDE 20 MG PO TABS: 20.0000 mg | ORAL_TABLET | Freq: Every day | ORAL | Status: DC

## 2012-07-26 NOTE — Progress Notes (Signed)
August Saucer Date of Birth: 1933-03-08 Medical Record #[id number]  History of Present Illness: Ms. Khamis is seen back today for a follow up visit as well as a post hospital visit. Seen for Dr. Johney Frame. She has PAF, HTN, CKD and diastolic heart failure. No know CAD. Was admitted towards the end of May with asthma exacerbation. Has had prior cardioversions for her atrial fib and in the past had been "highly symptomatic". Remains on flecainide. Now on Xarelto for anticoagulation.   I saw her 3 weeks ago. She was doing ok. Was back in atrial fib and did NOT seem to be aware nor symptomatic.   Admitted 6/9 to 6/13 with acute on chronic diastolic HF. Echo was updated. Normal EF. She was diuresed and had worsening renal function. Was in sinus rhythm during that admission.   Comes back today. She is here with her son. She is doing ok. Using a walker. Remains on her oxygen. Weight is down 9 pounds since discharge. No palpitations. Some fleeting dizziness. Trying to do better about restricting her salt. No chest pain. No swelling. Weighing at home now. Sees Dr. Chilton Si tomorrow.   Current Outpatient Prescriptions  Medication Sig Dispense Refill  . acetaminophen (TYLENOL) 325 MG tablet Take 650 mg by mouth 2 (two) times daily. For temp above 102      . amLODipine (NORVASC) 10 MG tablet Take 10 mg by mouth daily.      . benzonatate (TESSALON) 100 MG capsule Take 1 capsule (100 mg total) by mouth 3 (three) times daily as needed for cough.  20 capsule  0  . calcium carbonate (OS-CAL) 600 MG TABS Take 600 mg by mouth 2 (two) times daily with a meal.      . chlorzoxazone (PARAFON) 500 MG tablet Take 250 mg by mouth 3 (three) times daily as needed for muscle spasms.      . flecainide (TAMBOCOR) 100 MG tablet Take 1 tablet (100 mg total) by mouth every 12 (twelve) hours.  180 tablet  3  . furosemide (LASIX) 20 MG tablet Take 2 tablets (40 mg total) by mouth daily. HOLD TODAY. Resume tomorrow 07/17/2012.  30  tablet    . Glucosamine-Chondroit-Vit C-Mn (GLUCOSAMINE CHONDR 1500 COMPLX PO) Take 1 tablet by mouth daily.      Marland Kitchen lisinopril (PRINIVIL,ZESTRIL) 40 MG tablet Take 40 mg by mouth daily.      . mometasone-formoterol (DULERA) 100-5 MCG/ACT AERO Inhale 2 puffs into the lungs 2 (two) times daily.      . Multiple Vitamin (MULITIVITAMIN WITH MINERALS) TABS Take 1 tablet by mouth daily.      . Multiple Vitamins-Minerals (ICAPS PO) Take 1 tablet by mouth 2 (two) times daily.      . Nebivolol HCl 20 MG TABS Take 1 tablet (20 mg total) by mouth daily.  90 tablet  3  . omeprazole (PRILOSEC) 20 MG capsule Take 20 mg by mouth daily.      . Rivaroxaban (XARELTO) 20 MG TABS Take 1 tablet (20 mg total) by mouth daily.  90 tablet  3   No current facility-administered medications for this visit.    Allergies  Allergen Reactions  . Aspirin Nausea And Vomiting  . Aspirin Buf(Alhyd-Mghyd-Cacar) Nausea And Vomiting  . Butazolidin (Phenylbutazone) Other (See Comments)    unknown  . Celebrex (Celecoxib) Other (See Comments)    unknown  . Codeine Nausea And Vomiting  . Doripenem Other (See Comments)    unknown  . Methocarbamol Hives  .  Temazepam Other (See Comments)    unknown  . Vicodin (Hydrocodone-Acetaminophen)     Interacts with bp medication and drops blood pressure    Past Medical History  Diagnosis Date  . Asthma   . Shortness of breath   . Arthritis   . Anxiety   . Hypertension   . Atrial fibrillation     a. 04/04/11  . Osteoarthritis     a. 03/28/11 - R Total Hip Arthroplasty  . Cataracts, both eyes   . Pneumonia   . Bronchitis   . Pleurisy   . Ankle swelling   . Kidney infection   . Frequent urination   . Excessive urination at night   . Chronic headaches   . Weight gain     Past Surgical History  Procedure Laterality Date  . Cardiac catheterization      1980's or 90's - reportedly normal  . Abdominal hysterectomy  1962  . Appendectomy  1954  . Cholecystectomy    . Back  surgery  1990  . Cataract extraction bilateral w/ anterior vitrectomy  2007/2008  . Total hip arthroplasty  03/28/2011    Procedure: TOTAL HIP ARTHROPLASTY;  Surgeon: Thera Flake., MD;  Location: [facility] OR;  Service: Orthopedics;  Laterality: Right;  . Cardioversion  04/05/2011    Procedure: CARDIOVERSION;  Surgeon: Gardiner Rhyme, MD;  Location: [facility] OR;  Service: Cardiovascular;  Laterality: N/A;  . Tonsillectomy  1943  . Adenoidectomy  1949  . Foot surgery  1947  . Thoracic disc surgery  2008  . Lumbar disc surgery  2010  . Knee surgery       both knees  . Shoulder surgery      left  . Total hip arthroplasty  03/28/2011    right  . Cardioversion N/A 06/19/2012    Procedure: CARDIOVERSION-bedside([id number]);  Surgeon: Lewayne Bunting, MD;  Location: [facility] OR;  Service: Cardiovascular;  Laterality: N/A;    History  Smoking status  . Former Smoker -- 21 years  . Types: Cigarettes  . Quit date: 03/17/1970  Smokeless tobacco  . Never Used    History  Alcohol Use No    Family History  Problem Relation Age of Onset  . Diabetes Father     Father, Mother, 4 sisters (2 living)  . Heart attack Father     (Deceased)  . Heart failure Father     (deceased 96)  . Stroke Mother     (deceased 68)  . Cancer Sister     (deceased)  . Hypertension Father   . Hypertension Mother   . Heart attack Sister   . Diabetes Sister     Review of Systems: The review of systems is per the HPI.  All other systems were reviewed and are negative.  Physical Exam: BP 126/70  Pulse 60  Ht 5\' 6"  (1.676 m)  Wt 241 lb (109.317 kg)  BMI 38.92 kg/m2 Patient is very pleasant and in no acute distress. She is obese. Using a walker for ambulation. Skin is warm and dry. Color is normal.  HEENT is unremarkable. Normocephalic/atraumatic. PERRL. Sclera are nonicteric. Neck is supple. No masses. No JVD. Lungs are clear. Cardiac exam shows a regular rate and rhythm today. Abdomen is soft. Extremities are without edema.  Gait and ROM are intact. No gross neurologic deficits noted.  LABORATORY DATA: BMET and CBC pending  Lab Results  Component Value Date   WBC 8.5 07/12/2012   HGB 9.6* 07/12/2012  HCT 30.0* 07/12/2012   PLT 254 07/12/2012   GLUCOSE 93 07/16/2012   CHOL 204* 04/05/2011   TRIG 211* 04/05/2011   HDL 26* 04/05/2011   LDLCALC 136* 04/05/2011   ALT 16 07/12/2012   AST 11 07/12/2012   NA 139 07/16/2012   K 4.2 07/16/2012   CL 100 07/16/2012   CREATININE 1.46* 07/16/2012   BUN 34* 07/16/2012   CO2 33* 07/16/2012   TSH 0.435 06/26/2012   INR 1.07 04/04/2011   Echo Study Conclusions  - Left ventricle: The cavity size was mildly dilated. Wall thickness was normal. Systolic function was normal. The estimated ejection fraction was in the range of 55% to 60%. - Mitral valve: Mild regurgitation. - Left atrium: The atrium was mildly dilated.   Assessment / Plan: 1. Diastolic heart failure - with recent exacerbation - looks more compensated today. Reminded her about the importance of salt restriction and daily weights. Rechecking her labs today.   2. PAF - was in atrial fib when I last saw her. EKG from this last admission 10 days ago showed sinus rhythm. Remains on Flecainide - I suspect she is going in and out. No indication for cardioversion in my opinion. She is in sinus by exam today.   3. Chronic anticoagulation with Xarelto. No adverse bleeding noted.   4. Anemia - recheck CBC today.  5. CKD - rechecking BMET today.   Patient is agreeable to this plan and will call if any problems develop in the interim.   Rosalio Macadamia, RN, ANP-C [facility] [address] [place], [place]  [address]

## 2012-07-26 NOTE — Patient Instructions (Addendum)
We need to check labs today  Watch your salt  Keep weighing daily  See Dr. Johney Frame in 4 to 6 weeks  I have sent your prescriptions for Bystolic, Flecainide, and Xarelto to the [facility]  Call the [facility] office at [phone] if you have any questions, problems or concerns.

## 2012-07-28 ENCOUNTER — Telehealth: Payer: Self-pay | Admitting: Internal Medicine

## 2012-07-28 NOTE — Telephone Encounter (Signed)
New Problem:    Patient called in wanting to know if she was currently taking any medication that would conflict with her eye injection Lucentis.  Please call back.

## 2012-07-29 ENCOUNTER — Telehealth: Payer: Self-pay | Admitting: *Deleted

## 2012-07-29 ENCOUNTER — Other Ambulatory Visit (INDEPENDENT_AMBULATORY_CARE_PROVIDER_SITE_OTHER): Payer: Medicare Other

## 2012-07-29 DIAGNOSIS — I10 Essential (primary) hypertension: Secondary | ICD-10-CM

## 2012-07-29 LAB — BASIC METABOLIC PANEL
BUN: 51 mg/dL — ABNORMAL HIGH (ref 6–23)
CO2: 28 mEq/L (ref 19–32)
Calcium: 9.5 mg/dL (ref 8.4–10.5)
Chloride: 94 mEq/L — ABNORMAL LOW (ref 96–112)
Creatinine, Ser: 2.5 mg/dL — ABNORMAL HIGH (ref 0.4–1.2)
GFR: 19.94 mL/min — ABNORMAL LOW (ref >60.00)
Glucose, Bld: 111 mg/dL — ABNORMAL HIGH (ref 70–99)
Potassium: 5 mEq/L (ref 3.5–5.1)
Sodium: 136 mEq/L (ref 135–145)

## 2012-07-29 NOTE — Telephone Encounter (Signed)
Spoke with pt and grandson at the [employer].  They wanted to make sure Lucentis would not interfere with any of her current medications.  There are no drug interactions but there is a 1-5% chance of AFib so it may make this worse.  LM with Dr. Ephriam Knuckles office to let them know it would be okay from cardiac standpoint.

## 2012-07-29 NOTE — Telephone Encounter (Signed)
Pt seen in office today for lab work is requesting 20 mg of xarelto daily will give to pt

## 2012-07-30 ENCOUNTER — Other Ambulatory Visit: Payer: Self-pay | Admitting: *Deleted

## 2012-07-30 DIAGNOSIS — N189 Chronic kidney disease, unspecified: Secondary | ICD-10-CM

## 2012-08-09 ENCOUNTER — Other Ambulatory Visit (INDEPENDENT_AMBULATORY_CARE_PROVIDER_SITE_OTHER): Payer: Medicare Other

## 2012-08-09 DIAGNOSIS — N189 Chronic kidney disease, unspecified: Secondary | ICD-10-CM

## 2012-08-09 LAB — BASIC METABOLIC PANEL
BUN: 41 mg/dL — ABNORMAL HIGH (ref 6–23)
CO2: 26 mEq/L (ref 19–32)
Calcium: 9.6 mg/dL (ref 8.4–10.5)
Chloride: 97 mEq/L (ref 96–112)
Creatinine, Ser: 2.1 mg/dL — ABNORMAL HIGH (ref 0.4–1.2)
GFR: 23.88 mL/min — ABNORMAL LOW (ref >60.00)
Glucose, Bld: 124 mg/dL — ABNORMAL HIGH (ref 70–99)
Potassium: 4.3 mEq/L (ref 3.5–5.1)
Sodium: 137 mEq/L (ref 135–145)

## 2012-08-13 ENCOUNTER — Telehealth: Payer: Self-pay

## 2012-08-13 NOTE — Telephone Encounter (Signed)
[facility] called no answer.Left message on voice mail call office re guarding appointment for patient.

## 2012-08-15 ENCOUNTER — Telehealth: Payer: Self-pay | Admitting: Cardiovascular Disease

## 2012-08-15 NOTE — Telephone Encounter (Signed)
Patient's grandson called - BP is 175/90.  She is not feeling well.    I recommended that she take Norvasc 5 mg ( 1/2 tablet) tonight and call the office tomorrow.   Vesta Mixer, Montez Hageman., MD, [facility] 08/15/2012, 9:02 PM Office - [phone] Pager [phone]

## 2012-08-16 ENCOUNTER — Telehealth: Payer: Self-pay | Admitting: Internal Medicine

## 2012-08-16 NOTE — Telephone Encounter (Signed)
New problem  Pt's grandson was advised to call and speak with you by the on call doctor last night.

## 2012-08-17 ENCOUNTER — Ambulatory Visit (INDEPENDENT_AMBULATORY_CARE_PROVIDER_SITE_OTHER): Payer: Medicare Other | Admitting: Nurse Practitioner

## 2012-08-17 ENCOUNTER — Encounter: Payer: Self-pay | Admitting: Nurse Practitioner

## 2012-08-17 VITALS — BP 150/70 | HR 64 | Ht 66.0 in | Wt 237.8 lb

## 2012-08-17 DIAGNOSIS — I5032 Chronic diastolic (congestive) heart failure: Secondary | ICD-10-CM

## 2012-08-17 DIAGNOSIS — N183 Chronic kidney disease, stage 3 unspecified: Secondary | ICD-10-CM

## 2012-08-17 DIAGNOSIS — I4891 Unspecified atrial fibrillation: Secondary | ICD-10-CM

## 2012-08-17 DIAGNOSIS — I480 Paroxysmal atrial fibrillation: Secondary | ICD-10-CM

## 2012-08-17 DIAGNOSIS — I10 Essential (primary) hypertension: Secondary | ICD-10-CM

## 2012-08-17 LAB — BASIC METABOLIC PANEL
BUN: 29 mg/dL — ABNORMAL HIGH (ref 6–23)
CO2: 28 mEq/L (ref 19–32)
Calcium: 10.1 mg/dL (ref 8.4–10.5)
Chloride: 95 mEq/L — ABNORMAL LOW (ref 96–112)
Creatinine, Ser: 1.7 mg/dL — ABNORMAL HIGH (ref 0.4–1.2)
GFR: 31.18 mL/min — ABNORMAL LOW (ref >60.00)
Glucose, Bld: 106 mg/dL — ABNORMAL HIGH (ref 70–99)
Potassium: 4.4 mEq/L (ref 3.5–5.1)
Sodium: 137 mEq/L (ref 135–145)

## 2012-08-17 NOTE — Patient Instructions (Signed)
Monitor your blood pressure - may take an extra half of Norvasc if your BP is above 180 and not coming down  Otherwise stay on your current medicines  See Dr. Johney Frame in 3 months  We will check labs today  Call the [facility] office at [phone] if you have any questions, problems or concerns.

## 2012-08-17 NOTE — Telephone Encounter (Signed)
Patient was arranged to see Norma Fredrickson, NP 08/17/12

## 2012-08-17 NOTE — Progress Notes (Signed)
Jordan Byrd Date of Birth: 11/12/1933 Medical Record #[id number]  History of Present Illness: Jordan Byrd is seen back today for a one month visit.  Seen for Dr. Johney Frame. She has PAF, HTN, CKD and diastolic heart failure. No know CAD. Was admitted towards the end of May with asthma exacerbation. Has had prior cardioversions for her atrial fib and in the past had been "highly symptomatic". Remains on flecainide. Now on Xarelto for anticoagulation.   I saw her in early June. She was doing ok. Was back in atrial fib and did NOT seem to be aware nor symptomatic.   Admitted 6/9 to 6/13 with acute on chronic diastolic HF. Echo was updated. Normal EF. She was diuresed and had worsening renal function. Was in sinus rhythm during that admission.   I saw her a month ago and she seemed to be doing well. Was in sinus. Did have worsening renal function - referred to nephrology. Now off of her ACE and on lower dose of diuretic. To see nephrology in the fall based on her labs.   Comes in today. Here with her grandson. She is doing "wonderful". Not short of breath. Off her oxygen. No chest pain. No palpitations. BP has been great up until this past weekend. Started trending up and he called Dr. Elease Hashimoto (on call MD) and was advised to take an extra half of her Norvasc. Her BP has otherwise been normal. She was a little "aggravated" over some ball game and an incident at church. BP yesterday was normal. No swelling. Weight is stable. No bleeding.   Current Outpatient Prescriptions  Medication Sig Dispense Refill  . acetaminophen (TYLENOL) 325 MG tablet Take 650 mg by mouth 2 (two) times daily. For temp above 102      . amLODipine (NORVASC) 10 MG tablet Take 10 mg by mouth daily.      . benzonatate (TESSALON) 100 MG capsule Take 1 capsule (100 mg total) by mouth 3 (three) times daily as needed for cough.  20 capsule  0  . calcium carbonate (OS-CAL) 600 MG TABS Take 600 mg by mouth 2 (two) times daily with a meal.       . chlorzoxazone (PARAFON) 500 MG tablet Take 250 mg by mouth 3 (three) times daily as needed for muscle spasms.      . flecainide (TAMBOCOR) 100 MG tablet Take 1 tablet (100 mg total) by mouth every 12 (twelve) hours.  180 tablet  3  . furosemide (LASIX) 20 MG tablet Take 1 tablet (20 mg total) by mouth daily.  30 tablet  3  . Glucosamine-Chondroit-Vit C-Mn (GLUCOSAMINE CHONDR 1500 COMPLX PO) Take 1 tablet by mouth daily.      . mometasone-formoterol (DULERA) 100-5 MCG/ACT AERO Inhale 2 puffs into the lungs 2 (two) times daily.      . Multiple Vitamin (MULITIVITAMIN WITH MINERALS) TABS Take 1 tablet by mouth daily.      . Multiple Vitamins-Minerals (ICAPS PO) Take 1 tablet by mouth 2 (two) times daily.      . Nebivolol HCl 20 MG TABS Take 1 tablet (20 mg total) by mouth daily.  90 tablet  3  . omeprazole (PRILOSEC) 20 MG capsule Take 20 mg by mouth daily.      . Rivaroxaban (XARELTO) 20 MG TABS Take 1 tablet (20 mg total) by mouth daily.  90 tablet  3   No current facility-administered medications for this visit.    Allergies  Allergen Reactions  . Aspirin  Nausea And Vomiting  . Aspirin Buf(Alhyd-Mghyd-Cacar) Nausea And Vomiting  . Butazolidin (Phenylbutazone) Other (See Comments)    unknown  . Celebrex (Celecoxib) Other (See Comments)    unknown  . Codeine Nausea And Vomiting  . Doripenem Other (See Comments)    unknown  . Methocarbamol Hives  . Temazepam Other (See Comments)    unknown  . Vicodin (Hydrocodone-Acetaminophen)     Interacts with bp medication and drops blood pressure    Past Medical History  Diagnosis Date  . Asthma   . Shortness of breath   . Arthritis   . Anxiety   . Hypertension   . Atrial fibrillation     a. 04/04/11  . Osteoarthritis     a. 03/28/11 - R Total Hip Arthroplasty  . Cataracts, both eyes   . Pneumonia   . Bronchitis   . Pleurisy   . Ankle swelling   . Kidney infection   . Frequent urination   . Excessive urination at night   .  Chronic headaches   . Weight gain     Past Surgical History  Procedure Laterality Date  . Cardiac catheterization      1980's or 90's - reportedly normal  . Abdominal hysterectomy  1962  . Appendectomy  1954  . Cholecystectomy    . Back surgery  1990  . Cataract extraction bilateral w/ anterior vitrectomy  2007/2008  . Total hip arthroplasty  03/28/2011    Procedure: TOTAL HIP ARTHROPLASTY;  Surgeon: Thera Flake., MD;  Location: [facility] OR;  Service: Orthopedics;  Laterality: Right;  . Cardioversion  04/05/2011    Procedure: CARDIOVERSION;  Surgeon: Gardiner Rhyme, MD;  Location: [facility] OR;  Service: Cardiovascular;  Laterality: N/A;  . Tonsillectomy  1943  . Adenoidectomy  1949  . Foot surgery  1947  . Thoracic disc surgery  2008  . Lumbar disc surgery  2010  . Knee surgery       both knees  . Shoulder surgery      left  . Total hip arthroplasty  03/28/2011    right  . Cardioversion N/A 06/19/2012    Procedure: CARDIOVERSION-bedside(3W36);  Surgeon: Lewayne Bunting, MD;  Location: [facility] OR;  Service: Cardiovascular;  Laterality: N/A;    History  Smoking status  . Former Smoker -- 21 years  . Types: Cigarettes  . Quit date: 03/17/1970  Smokeless tobacco  . Never Used    History  Alcohol Use No    Family History  Problem Relation Age of Onset  . Diabetes Father     Father, Mother, 4 sisters (2 living)  . Heart attack Father     (Deceased)  . Heart failure Father     (deceased 22)  . Stroke Mother     (deceased 88)  . Cancer Sister     (deceased)  . Hypertension Father   . Hypertension Mother   . Heart attack Sister   . Diabetes Sister     Review of Systems: The review of systems is per the HPI.  All other systems were reviewed and are negative.  Physical Exam: BP 150/70  Pulse 64  Ht 5\' 6"  (1.676 m)  Wt 237 lb 12.8 oz (107.865 kg)  BMI 38.4 kg/m2 BP by me is 122/60. Weight is down 4 pounds over the 3 weeks.  Patient is very pleasant and in no acute  distress. Skin is warm and dry. Color is normal.  HEENT is unremarkable. Normocephalic/atraumatic. PERRL. Sclera  are nonicteric. Neck is supple. No masses. No JVD. Lungs are clear. Cardiac exam shows a regular rate and rhythm. Abdomen is soft. Extremities are without edema. Gait and ROM are intact. No gross neurologic deficits noted.  LABORATORY DATA: BMET is pending  Lab Results  Component Value Date   WBC 9.2 07/26/2012   HGB 11.1* 07/26/2012   HCT 34.0* 07/26/2012   PLT 462.0* 07/26/2012   GLUCOSE 124* 08/09/2012   CHOL 204* 04/05/2011   TRIG 211* 04/05/2011   HDL 26* 04/05/2011   LDLCALC 136* 04/05/2011   ALT 16 07/12/2012   AST 11 07/12/2012   NA 137 08/09/2012   K 4.3 08/09/2012   CL 97 08/09/2012   CREATININE 2.1* 08/09/2012   BUN 41* 08/09/2012   CO2 26 08/09/2012   TSH 0.435 06/26/2012   INR 1.07 04/04/2011    Assessment / Plan: 1. Diastolic heart failure - looks compensated. No change in current therapy and salt restriction.   2. PAF - in sinus by exam today.   3. Chronic anticoagulation with Xarelto - no problems noted.   4. Anemia   5. CKD - referral already in place to [facility] - based on her labs they will be making an appointment for the fall for consultation - will recheck today.   6. HTN - BP has only spiked once. Recheck by me today was normal. I have left her on her current regimen. If she has consistent readings above 180 she can take an extra half of the Norvasc and then be in touch with [place] for further discussion.   I think overall she is doing well.   Patient is agreeable to this plan and will call if any problems develop in the interim.   Rosalio Macadamia, RN, ANP-C [facility] [address] [place], [place]  [address]

## 2012-09-08 ENCOUNTER — Telehealth: Payer: Self-pay | Admitting: Internal Medicine

## 2012-09-08 ENCOUNTER — Ambulatory Visit (INDEPENDENT_AMBULATORY_CARE_PROVIDER_SITE_OTHER): Payer: Medicare Other | Admitting: Internal Medicine

## 2012-09-08 ENCOUNTER — Encounter: Payer: Self-pay | Admitting: Internal Medicine

## 2012-09-08 VITALS — BP 136/62 | HR 68 | Ht 66.0 in | Wt 234.0 lb

## 2012-09-08 DIAGNOSIS — I5033 Acute on chronic diastolic (congestive) heart failure: Secondary | ICD-10-CM

## 2012-09-08 DIAGNOSIS — I4891 Unspecified atrial fibrillation: Secondary | ICD-10-CM

## 2012-09-08 DIAGNOSIS — I10 Essential (primary) hypertension: Secondary | ICD-10-CM

## 2012-09-08 DIAGNOSIS — I5032 Chronic diastolic (congestive) heart failure: Secondary | ICD-10-CM

## 2012-09-08 DIAGNOSIS — N183 Chronic kidney disease, stage 3 unspecified: Secondary | ICD-10-CM

## 2012-09-08 DIAGNOSIS — I480 Paroxysmal atrial fibrillation: Secondary | ICD-10-CM

## 2012-09-08 MED ORDER — RIVAROXABAN 15 MG PO TABS: 15.0000 mg | ORAL_TABLET | Freq: Every day | ORAL | Status: DC

## 2012-09-08 MED ORDER — FUROSEMIDE 20 MG PO TABS: 20.0000 mg | ORAL_TABLET | Freq: Every day | ORAL | Status: DC

## 2012-09-08 NOTE — Telephone Encounter (Signed)
[phone]  Order faxed to d/c o2 to [phone]

## 2012-09-08 NOTE — Telephone Encounter (Signed)
New Prob  Pt was advised to have [facility] to come and pick up oxygen and tanks. Pt was advised by [facility] that she needs an e-mail stating that she doesn't need they oxygen anymore.Marland Kitchen

## 2012-09-08 NOTE — Patient Instructions (Addendum)
Your physician wants you to follow-up in: 6 months with Dr Johney Frame  And Kennon Rounds in anti coag clinicYou will receive a reminder letter in the mail two months in advance. If you don't receive a letter, please call our office to schedule the follow-up appointment.  Stop home Oxygen    Your physician has recommended you make the following change in your medication:  1) Decrease Xarelto to 15 mg daily

## 2012-09-08 NOTE — Progress Notes (Signed)
PCP:  Enrique Sack, MD  The patient presents today for routine electrophysiology followup.  Since last being seen by Norma Fredrickson, the patient reports doing very well.  She states "I feel wonderful today".  Today, she denies symptoms of palpitations, chest pain, shortness of breath, orthopnea, PND, lower extremity edema, dizziness, presyncope, syncope, or neurologic sequela. She is not using her home O2.  The patient feels that she is tolerating medications without difficulties and is otherwise without complaint today.   Past Medical History  Diagnosis Date  . Asthma   . Shortness of breath   . Arthritis   . Anxiety   . Hypertension   . Atrial fibrillation     a. 04/04/11  . Osteoarthritis     a. 03/28/11 - R Total Hip Arthroplasty  . Cataracts, both eyes   . Pneumonia   . Bronchitis   . Pleurisy   . Ankle swelling   . Kidney infection   . Frequent urination   . Excessive urination at night   . Chronic headaches   . Weight gain    Past Surgical History  Procedure Laterality Date  . Cardiac catheterization      1980's or 90's - reportedly normal  . Abdominal hysterectomy  1962  . Appendectomy  1954  . Cholecystectomy    . Back surgery  1990  . Cataract extraction bilateral w/ anterior vitrectomy  2007/2008  . Total hip arthroplasty  03/28/2011    Procedure: TOTAL HIP ARTHROPLASTY;  Surgeon: Thera Flake., MD;  Location: [facility] OR;  Service: Orthopedics;  Laterality: Right;  . Cardioversion  04/05/2011    Procedure: CARDIOVERSION;  Surgeon: Gardiner Rhyme, MD;  Location: [facility] OR;  Service: Cardiovascular;  Laterality: N/A;  . Tonsillectomy  1943  . Adenoidectomy  1949  . Foot surgery  1947  . Thoracic disc surgery  2008  . Lumbar disc surgery  2010  . Knee surgery       both knees  . Shoulder surgery      left  . Total hip arthroplasty  03/28/2011    right  . Cardioversion N/A 06/19/2012    Procedure: CARDIOVERSION-bedside(3W36);  Surgeon: Lewayne Bunting, MD;   Location: [facility] OR;  Service: Cardiovascular;  Laterality: N/A;    Current Outpatient Prescriptions  Medication Sig Dispense Refill  . acetaminophen (TYLENOL) 325 MG tablet Take 650 mg by mouth 2 (two) times daily. For temp above 102      . amLODipine (NORVASC) 10 MG tablet Take 10 mg by mouth daily.      . benzonatate (TESSALON) 100 MG capsule Take 1 capsule (100 mg total) by mouth 3 (three) times daily as needed for cough.  20 capsule  0  . calcium carbonate (OS-CAL) 600 MG TABS Take 600 mg by mouth 2 (two) times daily with a meal.      . chlorzoxazone (PARAFON) 500 MG tablet Take 250 mg by mouth 3 (three) times daily as needed for muscle spasms.      . flecainide (TAMBOCOR) 100 MG tablet Take 1 tablet (100 mg total) by mouth every 12 (twelve) hours.  180 tablet  3  . furosemide (LASIX) 20 MG tablet Take 1 tablet (20 mg total) by mouth daily.  30 tablet  3  . Glucosamine-Chondroit-Vit C-Mn (GLUCOSAMINE CHONDR 1500 COMPLX PO) Take 1 tablet by mouth daily.      . mometasone-formoterol (DULERA) 100-5 MCG/ACT AERO Inhale 2 puffs into the lungs 2 (two)  times daily.      . Multiple Vitamin (MULITIVITAMIN WITH MINERALS) TABS Take 1 tablet by mouth daily.      . Multiple Vitamins-Minerals (ICAPS PO) Take 1 tablet by mouth 2 (two) times daily.      . Nebivolol HCl 20 MG TABS Take 1 tablet (20 mg total) by mouth daily.  90 tablet  3  . omeprazole (PRILOSEC) 20 MG capsule Take 20 mg by mouth daily.      . Rivaroxaban (XARELTO) 20 MG TABS Take 1 tablet (20 mg total) by mouth daily.  90 tablet  3   No current facility-administered medications for this visit.    Allergies  Allergen Reactions  . Aspirin Nausea And Vomiting  . Aspirin Buf(Alhyd-Mghyd-Cacar) Nausea And Vomiting  . Butazolidin (Phenylbutazone) Other (See Comments)    unknown  . Celebrex (Celecoxib) Other (See Comments)    unknown  . Codeine Nausea And Vomiting  . Doripenem Other (See Comments)    unknown  . Methocarbamol Hives  .  Temazepam Other (See Comments)    unknown  . Vicodin (Hydrocodone-Acetaminophen)     Interacts with bp medication and drops blood pressure    History   Social History  . Marital Status: Widowed    Spouse Name: N/A    Number of Children: N/A  . Years of Education: N/A   Occupational History  . Retired    Social History Main Topics  . Smoking status: Former Smoker -- 21 years    Types: Cigarettes    Quit date: 03/17/1970  . Smokeless tobacco: Never Used  . Alcohol Use: No  . Drug Use: No  . Sexually Active: Not Currently   Other Topics Concern  . Not on file   Social History Narrative   Lives alone in [place].  Widowed in 2012 (husband w/ dementia & parkinsons)..   Not active, does not drive due to vision problems.    Family History  Problem Relation Age of Onset  . Diabetes Father     Father, Mother, 4 sisters (2 living)  . Heart attack Father     (Deceased)  . Heart failure Father     (deceased 4)  . Stroke Mother     (deceased 27)  . Cancer Sister     (deceased)  . Hypertension Father   . Hypertension Mother   . Heart attack Sister   . Diabetes Sister    Physical Exam: Filed Vitals:   09/08/12 0950  BP: 136/62  Pulse: 68  Height: 5\' 6"  (1.676 m)  Weight: 234 lb (106.142 kg)    GEN- The patient is overweight appearing, alert and oriented x 3 today.   Head- normocephalic, atraumatic Eyes-  Sclera clear, conjunctiva pink Ears- hearing intact Oropharynx- clear Neck- supple,   Lungs- Clear to ausculation bilaterally, normal work of breathing Heart- Regular rate and rhythm, no murmurs, rubs or gallops, PMI not laterally displaced GI- soft, NT, ND, + BS Extremities- no clubbing, cyanosis, trace edema MS- no significant deformity or atrophy Skin- no rash or lesion Psych- euthymic mood, full affect Neuro- strength and sensation are intact  ekg today reveals sinus rhythm 64 bpm, PR 240, incomplete LBBB  Assessment and Plan:  1. afib Controlled CrCl  is 46.  Would therefore reduce xarelto to 15mg  daily today  2. Chronic diastolic dysfunction.  Continue current treatment  3. CRI Stable No change required today Adjust xarelto as above  4. HTN Stable No change required today  Return to  see Lawson Fiscal as scheduled I will see again in 6 months

## 2012-09-19 ENCOUNTER — Telehealth: Payer: Self-pay | Admitting: Physician Assistant

## 2012-09-19 ENCOUNTER — Emergency Department (HOSPITAL_COMMUNITY): Payer: Medicare Other

## 2012-09-19 ENCOUNTER — Telehealth: Payer: Self-pay | Admitting: Cardiology

## 2012-09-19 ENCOUNTER — Inpatient Hospital Stay (HOSPITAL_COMMUNITY)
Admission: EM | Admit: 2012-09-19 | Discharge: 2012-09-22 | DRG: 291 | Disposition: A | Discharge status: Home / Self Care | Payer: Medicare Other | Attending: Internal Medicine | Admitting: Internal Medicine

## 2012-09-19 ENCOUNTER — Encounter (HOSPITAL_COMMUNITY): Payer: Self-pay | Admitting: Emergency Medicine

## 2012-09-19 DIAGNOSIS — J441 Chronic obstructive pulmonary disease with (acute) exacerbation: Secondary | ICD-10-CM | POA: Diagnosis present

## 2012-09-19 DIAGNOSIS — F411 Generalized anxiety disorder: Secondary | ICD-10-CM

## 2012-09-19 DIAGNOSIS — K219 Gastro-esophageal reflux disease without esophagitis: Secondary | ICD-10-CM | POA: Diagnosis present

## 2012-09-19 DIAGNOSIS — D72829 Elevated white blood cell count, unspecified: Secondary | ICD-10-CM | POA: Diagnosis present

## 2012-09-19 DIAGNOSIS — Z91199 Patient's noncompliance with other medical treatment and regimen due to unspecified reason: Secondary | ICD-10-CM

## 2012-09-19 DIAGNOSIS — Z7901 Long term (current) use of anticoagulants: Secondary | ICD-10-CM

## 2012-09-19 DIAGNOSIS — J189 Pneumonia, unspecified organism: Secondary | ICD-10-CM | POA: Diagnosis present

## 2012-09-19 DIAGNOSIS — I129 Hypertensive chronic kidney disease with stage 1 through stage 4 chronic kidney disease, or unspecified chronic kidney disease: Secondary | ICD-10-CM | POA: Diagnosis present

## 2012-09-19 DIAGNOSIS — I10 Essential (primary) hypertension: Secondary | ICD-10-CM | POA: Diagnosis present

## 2012-09-19 DIAGNOSIS — J45901 Unspecified asthma with (acute) exacerbation: Secondary | ICD-10-CM | POA: Diagnosis present

## 2012-09-19 DIAGNOSIS — I509 Heart failure, unspecified: Secondary | ICD-10-CM | POA: Diagnosis present

## 2012-09-19 DIAGNOSIS — N183 Chronic kidney disease, stage 3 unspecified: Secondary | ICD-10-CM | POA: Diagnosis present

## 2012-09-19 DIAGNOSIS — N179 Acute kidney failure, unspecified: Secondary | ICD-10-CM

## 2012-09-19 DIAGNOSIS — R42 Dizziness and giddiness: Secondary | ICD-10-CM

## 2012-09-19 DIAGNOSIS — I059 Rheumatic mitral valve disease, unspecified: Secondary | ICD-10-CM | POA: Diagnosis present

## 2012-09-19 DIAGNOSIS — R0603 Acute respiratory distress: Secondary | ICD-10-CM

## 2012-09-19 DIAGNOSIS — I4891 Unspecified atrial fibrillation: Secondary | ICD-10-CM | POA: Diagnosis present

## 2012-09-19 DIAGNOSIS — I5033 Acute on chronic diastolic (congestive) heart failure: Principal | ICD-10-CM | POA: Diagnosis present

## 2012-09-19 DIAGNOSIS — Z9981 Dependence on supplemental oxygen: Secondary | ICD-10-CM

## 2012-09-19 DIAGNOSIS — N390 Urinary tract infection, site not specified: Secondary | ICD-10-CM | POA: Diagnosis present

## 2012-09-19 DIAGNOSIS — I5030 Unspecified diastolic (congestive) heart failure: Secondary | ICD-10-CM

## 2012-09-19 DIAGNOSIS — Z96649 Presence of unspecified artificial hip joint: Secondary | ICD-10-CM

## 2012-09-19 DIAGNOSIS — R197 Diarrhea, unspecified: Secondary | ICD-10-CM

## 2012-09-19 DIAGNOSIS — I480 Paroxysmal atrial fibrillation: Secondary | ICD-10-CM | POA: Diagnosis present

## 2012-09-19 DIAGNOSIS — N184 Chronic kidney disease, stage 4 (severe): Secondary | ICD-10-CM | POA: Diagnosis present

## 2012-09-19 DIAGNOSIS — Z87891 Personal history of nicotine dependence: Secondary | ICD-10-CM

## 2012-09-19 DIAGNOSIS — I447 Left bundle-branch block, unspecified: Secondary | ICD-10-CM | POA: Diagnosis present

## 2012-09-19 DIAGNOSIS — M1990 Unspecified osteoarthritis, unspecified site: Secondary | ICD-10-CM

## 2012-09-19 DIAGNOSIS — Z9119 Patient's noncompliance with other medical treatment and regimen: Secondary | ICD-10-CM

## 2012-09-19 DIAGNOSIS — F419 Anxiety disorder, unspecified: Secondary | ICD-10-CM

## 2012-09-19 HISTORY — DX: Heart failure, unspecified: I50.9

## 2012-09-19 LAB — CBC
HCT: 28.8 % — ABNORMAL LOW (ref 36.0–46.0)
Hemoglobin: 9.2 g/dL — ABNORMAL LOW (ref 12.0–15.0)
MCH: 27.9 pg (ref 26.0–34.0)
MCHC: 31.9 g/dL (ref 30.0–36.0)
MCV: 87.3 fL (ref 78.0–100.0)
Platelets: 356 10*3/uL (ref 150–400)
RBC: 3.3 MIL/uL — ABNORMAL LOW (ref 3.87–5.11)
RDW: 14.1 % (ref 11.5–15.5)
WBC: 16.9 10*3/uL — ABNORMAL HIGH (ref 4.0–10.5)

## 2012-09-19 LAB — URINALYSIS, ROUTINE W REFLEX MICROSCOPIC
Bilirubin Urine: NEGATIVE
Glucose, UA: NEGATIVE mg/dL
Hgb urine dipstick: NEGATIVE
Ketones, ur: NEGATIVE mg/dL
Nitrite: POSITIVE — AB
Protein, ur: NEGATIVE mg/dL
Specific Gravity, Urine: 1.024 (ref 1.005–1.030)
Urobilinogen, UA: 0.2 mg/dL (ref 0.0–1.0)
pH: 5.5 (ref 5.0–8.0)

## 2012-09-19 LAB — BASIC METABOLIC PANEL
BUN: 24 mg/dL — ABNORMAL HIGH (ref 6–23)
CO2: 27 mEq/L (ref 19–32)
Calcium: 8.9 mg/dL (ref 8.4–10.5)
Chloride: 98 mEq/L (ref 96–112)
Creatinine, Ser: 1.5 mg/dL — ABNORMAL HIGH (ref 0.50–1.10)
GFR calc Af Amer: 38 mL/min — ABNORMAL LOW (ref >90)
GFR calc non Af Amer: 32 mL/min — ABNORMAL LOW (ref >90)
Glucose, Bld: 134 mg/dL — ABNORMAL HIGH (ref 70–99)
Potassium: 3.9 mEq/L (ref 3.5–5.1)
Sodium: 138 mEq/L (ref 135–145)

## 2012-09-19 LAB — PRO B NATRIURETIC PEPTIDE: Pro B Natriuretic peptide (BNP): 1097 pg/mL — ABNORMAL HIGH (ref 0–450)

## 2012-09-19 LAB — URINE MICROSCOPIC-ADD ON

## 2012-09-19 MED ORDER — SODIUM CHLORIDE 0.9 % IJ SOLN
3.0000 mL | Freq: Two times a day (BID) | INTRAMUSCULAR | Status: DC
  Administered 2012-09-20 – 2012-09-22 (×2): 3 mL via INTRAVENOUS

## 2012-09-19 MED ORDER — VANCOMYCIN HCL 10 G IV SOLR
1500.0000 mg | INTRAVENOUS | Status: DC
  Administered 2012-09-20 – 2012-09-21 (×2): 1500 mg via INTRAVENOUS
  Filled 2012-09-19 (×2): qty 1500

## 2012-09-19 MED ORDER — DOCUSATE SODIUM 100 MG PO CAPS
100.0000 mg | ORAL_CAPSULE | Freq: Two times a day (BID) | ORAL | Status: DC
  Administered 2012-09-19 – 2012-09-20 (×3): 100 mg via ORAL
  Filled 2012-09-19 (×7): qty 1

## 2012-09-19 MED ORDER — ACETAMINOPHEN 325 MG PO TABS
650.0000 mg | ORAL_TABLET | Freq: Two times a day (BID) | ORAL | Status: DC | PRN
  Administered 2012-09-20 – 2012-09-21 (×3): 650 mg via ORAL
  Filled 2012-09-19 (×3): qty 2

## 2012-09-19 MED ORDER — PREDNISONE 20 MG PO TABS
40.0000 mg | ORAL_TABLET | Freq: Every day | ORAL | Status: AC
  Administered 2012-09-20 – 2012-09-22 (×3): 40 mg via ORAL
  Filled 2012-09-19 (×3): qty 2

## 2012-09-19 MED ORDER — SODIUM CHLORIDE 0.9 % IJ SOLN
3.0000 mL | Freq: Two times a day (BID) | INTRAMUSCULAR | Status: DC
  Administered 2012-09-20 – 2012-09-22 (×3): 3 mL via INTRAVENOUS

## 2012-09-19 MED ORDER — MOMETASONE FURO-FORMOTEROL FUM 100-5 MCG/ACT IN AERO
2.0000 | INHALATION_SPRAY | Freq: Two times a day (BID) | RESPIRATORY_TRACT | Status: DC
  Administered 2012-09-19 – 2012-09-22 (×6): 2 via RESPIRATORY_TRACT
  Filled 2012-09-19: qty 8.8

## 2012-09-19 MED ORDER — SODIUM CHLORIDE 0.9 % IV SOLN: 250.0000 mL | INTRAVENOUS | Status: DC | PRN

## 2012-09-19 MED ORDER — AMLODIPINE BESYLATE 10 MG PO TABS
10.0000 mg | ORAL_TABLET | Freq: Every day | ORAL | Status: DC
  Administered 2012-09-20 – 2012-09-22 (×3): 10 mg via ORAL
  Filled 2012-09-19 (×3): qty 1

## 2012-09-19 MED ORDER — PIPERACILLIN-TAZOBACTAM 3.375 G IVPB
3.3750 g | Freq: Three times a day (TID) | INTRAVENOUS | Status: DC
  Administered 2012-09-19 – 2012-09-22 (×8): 3.375 g via INTRAVENOUS
  Filled 2012-09-19 (×12): qty 50

## 2012-09-19 MED ORDER — PIPERACILLIN-TAZOBACTAM 3.375 G IVPB 30 MIN: 3.3750 g | Freq: Three times a day (TID) | INTRAVENOUS | Status: DC

## 2012-09-19 MED ORDER — MORPHINE SULFATE 2 MG/ML IJ SOLN: 2.0000 mg | INTRAMUSCULAR | Status: DC | PRN

## 2012-09-19 MED ORDER — VANCOMYCIN HCL 10 G IV SOLR
1250.0000 mg | Freq: Once | INTRAVENOUS | Status: AC
  Administered 2012-09-19: 1250 mg via INTRAVENOUS
  Filled 2012-09-19: qty 1250

## 2012-09-19 MED ORDER — CALCIUM CARBONATE 1250 (500 CA) MG PO TABS
1.0000 | ORAL_TABLET | Freq: Two times a day (BID) | ORAL | Status: DC
  Administered 2012-09-20 – 2012-09-22 (×5): 500 mg via ORAL
  Filled 2012-09-19 (×7): qty 1

## 2012-09-19 MED ORDER — CALCIUM CARBONATE 600 MG PO TABS
600.0000 mg | ORAL_TABLET | Freq: Two times a day (BID) | ORAL | Status: DC
  Filled 2012-09-19: qty 1

## 2012-09-19 MED ORDER — ALBUTEROL SULFATE (5 MG/ML) 0.5% IN NEBU
2.5000 mg | INHALATION_SOLUTION | Freq: Four times a day (QID) | RESPIRATORY_TRACT | Status: DC
  Administered 2012-09-19 – 2012-09-20 (×5): 2.5 mg via RESPIRATORY_TRACT
  Filled 2012-09-19 (×5): qty 0.5

## 2012-09-19 MED ORDER — RIVAROXABAN 15 MG PO TABS
15.0000 mg | ORAL_TABLET | Freq: Every day | ORAL | Status: DC
  Administered 2012-09-20 – 2012-09-21 (×2): 15 mg via ORAL
  Filled 2012-09-19 (×4): qty 1

## 2012-09-19 MED ORDER — ALBUTEROL SULFATE (5 MG/ML) 0.5% IN NEBU: 2.5000 mg | INHALATION_SOLUTION | RESPIRATORY_TRACT | Status: DC | PRN

## 2012-09-19 MED ORDER — FUROSEMIDE 10 MG/ML IJ SOLN
40.0000 mg | Freq: Every day | INTRAMUSCULAR | Status: DC
  Administered 2012-09-19: 40 mg via INTRAVENOUS
  Filled 2012-09-19 (×2): qty 4

## 2012-09-19 MED ORDER — RIVAROXABAN 20 MG PO TABS: 20.0000 mg | ORAL_TABLET | Freq: Every day | ORAL | Status: DC

## 2012-09-19 MED ORDER — ONDANSETRON HCL 4 MG/2ML IJ SOLN: 4.0000 mg | Freq: Four times a day (QID) | INTRAMUSCULAR | Status: DC | PRN

## 2012-09-19 MED ORDER — ONDANSETRON HCL 4 MG PO TABS: 4.0000 mg | ORAL_TABLET | Freq: Four times a day (QID) | ORAL | Status: DC | PRN

## 2012-09-19 MED ORDER — NEBIVOLOL HCL 10 MG PO TABS
20.0000 mg | ORAL_TABLET | Freq: Every day | ORAL | Status: DC
  Administered 2012-09-20 – 2012-09-22 (×3): 20 mg via ORAL
  Filled 2012-09-19 (×3): qty 2

## 2012-09-19 MED ORDER — SODIUM CHLORIDE 0.9 % IJ SOLN: 3.0000 mL | INTRAMUSCULAR | Status: DC | PRN

## 2012-09-19 MED ORDER — SODIUM CHLORIDE 0.9 % IV BOLUS (SEPSIS)
1000.0000 mL | Freq: Once | INTRAVENOUS | Status: AC
  Administered 2012-09-19: 1000 mL via INTRAVENOUS

## 2012-09-19 MED ORDER — PANTOPRAZOLE SODIUM 40 MG PO TBEC
40.0000 mg | DELAYED_RELEASE_TABLET | Freq: Every day | ORAL | Status: DC
  Administered 2012-09-20 – 2012-09-22 (×3): 40 mg via ORAL
  Filled 2012-09-19 (×3): qty 1

## 2012-09-19 MED ORDER — FLECAINIDE ACETATE 100 MG PO TABS
100.0000 mg | ORAL_TABLET | Freq: Two times a day (BID) | ORAL | Status: DC
  Administered 2012-09-19 – 2012-09-22 (×6): 100 mg via ORAL
  Filled 2012-09-19 (×7): qty 1

## 2012-09-19 MED ORDER — PIPERACILLIN-TAZOBACTAM 3.375 G IVPB
3.3750 g | Freq: Once | INTRAVENOUS | Status: AC
  Administered 2012-09-19: 3.375 g via INTRAVENOUS
  Filled 2012-09-19: qty 50

## 2012-09-19 MED ORDER — NEBIVOLOL HCL 20 MG PO TABS: 20.0000 mg | ORAL_TABLET | Freq: Every day | ORAL | Status: DC

## 2012-09-19 NOTE — ED Notes (Addendum)
Pt reports cough onset Friday night. Pt reports woke up Saturday and Sunday unable to breath. Family reports pulse ox 77% at home and Temp 99.3. Pt took 20mg  of Lasix at 0930 and 20 mg again at 1230.

## 2012-09-19 NOTE — Progress Notes (Signed)
ANTIBIOTIC CONSULT NOTE  Pharmacy Consult for vancomcyin Indication: rule out pneumonia  Allergies  Allergen Reactions  . Aspirin Nausea And Vomiting  . Aspirin Buf(Alhyd-Mghyd-Cacar) Nausea And Vomiting  . Butazolidin [Phenylbutazone] Other (See Comments)    unknown  . Celebrex [Celecoxib] Other (See Comments)    unknown  . Codeine Nausea And Vomiting  . Doripenem Other (See Comments)    unknown  . Methocarbamol Hives  . Temazepam Other (See Comments)    unknown  . Vicodin [Hydrocodone-Acetaminophen]     Interacts with bp medication and drops blood pressure    Patient Measurements: Height: 5\' 6"  (167.6 cm) Weight: 234 lb (106.142 kg) IBW/kg (Calculated) : 59.3  Vital Signs: Temp: 99.4 F (37.4 C) (08/17 1642) BP: 105/31 mmHg (08/17 2030) Pulse Rate: 73 (08/17 2030) Intake/Output from previous day:   Intake/Output from this shift:    Labs:  Recent Labs  09/19/12 1713  WBC 16.9*  HGB 9.2*  PLT 356  CREATININE 1.50*   Estimated Creatinine Clearance: 38.7 ml/min (by C-G formula based on Cr of 1.5). No results found for this basename: VANCOTROUGH, VANCOPEAK, VANCORANDOM, GENTTROUGH, GENTPEAK, GENTRANDOM, TOBRATROUGH, TOBRAPEAK, TOBRARND, AMIKACINPEAK, AMIKACINTROU, AMIKACIN,  in the last 72 hours   Microbiology: No results found for this or any previous visit (from the past 720 hour(s)).  Medical History: Past Medical History  Diagnosis Date  . Asthma   . Shortness of breath   . Arthritis   . Anxiety   . Hypertension   . Atrial fibrillation     a. 04/04/11  . Osteoarthritis     a. 03/28/11 - R Total Hip Arthroplasty  . Cataracts, both eyes   . Pneumonia   . Bronchitis   . Pleurisy   . Ankle swelling   . Kidney infection   . Frequent urination   . Excessive urination at night   . Chronic headaches   . Weight gain   . CHF (congestive heart failure)    Assessment: 79 year old female presenting to [facility] with sob and fever. WBC found to be elevated at  16.9, scr somewhat elevated at 1.5. Patient given vancomycin 1250mg  x1 will continue therapy.  Goal of Therapy:  Vancomycin trough level 15-20 mcg/ml  Plan:  Measure antibiotic drug levels at steady state Follow up culture results Vancomycin 1250mg  x1 then continue 1500mg  q 24 hours  Sheppard Coil PharmD., BCPS Clinical Pharmacist Pager [phone] 09/19/2012 9:26 PM

## 2012-09-19 NOTE — ED Notes (Signed)
Pt knows that urine is needed. Pt doesn't have to void at this time 

## 2012-09-19 NOTE — ED Provider Notes (Signed)
CSN: [id number]     Arrival date & time 09/19/12  1627 History     First MD Initiated Contact with Patient 09/19/12 1703     Chief Complaint  Patient presents with  . Shortness of Breath  . Fever   (Consider location/radiation/quality/duration/timing/severity/associated sxs/prior Treatment) HPI Comments: Patient is a 79 year old female with a past medical history of CHF, COPD, atrial fibrillation who presents with a 1 day history of SOB. Symptoms started gradually and progressively worsened since the onset. Patient reports she has recently been weaned off her home oxygen and felt like she needed it yesterday because she was having trouble breathing. She reports associated low grade fever and cough as well. No aggravating/alleviating factors. No other associated symptoms. Patient was last hospitalized 2 months ago for chest pain.     Past Medical History  Diagnosis Date  . Asthma   . Shortness of breath   . Arthritis   . Anxiety   . Hypertension   . Atrial fibrillation     a. 04/04/11  . Osteoarthritis     a. 03/28/11 - R Total Hip Arthroplasty  . Cataracts, both eyes   . Pneumonia   . Bronchitis   . Pleurisy   . Ankle swelling   . Kidney infection   . Frequent urination   . Excessive urination at night   . Chronic headaches   . Weight gain   . CHF (congestive heart failure)    Past Surgical History  Procedure Laterality Date  . Cardiac catheterization      1980's or 90's - reportedly normal  . Abdominal hysterectomy  1962  . Appendectomy  1954  . Cholecystectomy    . Back surgery  1990  . Cataract extraction bilateral w/ anterior vitrectomy  2007/2008  . Total hip arthroplasty  03/28/2011    Procedure: TOTAL HIP ARTHROPLASTY;  Surgeon: Thera Flake., MD;  Location: [facility] OR;  Service: Orthopedics;  Laterality: Right;  . Cardioversion  04/05/2011    Procedure: CARDIOVERSION;  Surgeon: Gardiner Rhyme, MD;  Location: [facility] OR;  Service: Cardiovascular;  Laterality: N/A;  .  Tonsillectomy  1943  . Adenoidectomy  1949  . Foot surgery  1947  . Thoracic disc surgery  2008  . Lumbar disc surgery  2010  . Knee surgery       both knees  . Shoulder surgery      left  . Total hip arthroplasty  03/28/2011    right  . Cardioversion N/A 06/19/2012    Procedure: CARDIOVERSION-bedside(3W36);  Surgeon: Lewayne Bunting, MD;  Location: [facility] OR;  Service: Cardiovascular;  Laterality: N/A;   Family History  Problem Relation Age of Onset  . Diabetes Father     Father, Mother, 4 sisters (2 living)  . Heart attack Father     (Deceased)  . Heart failure Father     (deceased 76)  . Stroke Mother     (deceased 57)  . Cancer Sister     (deceased)  . Hypertension Father   . Hypertension Mother   . Heart attack Sister   . Diabetes Sister    History  Substance Use Topics  . Smoking status: Former Smoker -- 21 years    Types: Cigarettes    Quit date: 03/17/1970  . Smokeless tobacco: Never Used  . Alcohol Use: No   OB History   Grav Para Term Preterm Abortions TAB SAB Ect Mult Living  Review of Systems  Constitutional: Positive for fever.  Respiratory: Positive for shortness of breath.   All other systems reviewed and are negative.    Allergies  Aspirin; Aspirin buf(alhyd-mghyd-cacar); Butazolidin; Celebrex; Codeine; Doripenem; Methocarbamol; Temazepam; and Vicodin  Home Medications   Current Outpatient Rx  Name  Route  Sig  Dispense  Refill  . acetaminophen (TYLENOL) 325 MG tablet   Oral   Take 650 mg by mouth 2 (two) times daily. For temp above 102         . albuterol (PROVENTIL HFA;VENTOLIN HFA) 108 (90 BASE) MCG/ACT inhaler   Inhalation   Inhale 2 puffs into the lungs every 6 (six) hours as needed for wheezing.         Marland Kitchen amLODipine (NORVASC) 10 MG tablet   Oral   Take 10 mg by mouth daily.         . benzonatate (TESSALON) 100 MG capsule   Oral   Take 1 capsule (100 mg total) by mouth 3 (three) times daily as needed for  cough.   20 capsule   0   . calcium carbonate (OS-CAL) 600 MG TABS   Oral   Take 600 mg by mouth 2 (two) times daily with a meal.         . chlorzoxazone (PARAFON) 500 MG tablet   Oral   Take 250 mg by mouth 3 (three) times daily as needed for muscle spasms.         . flecainide (TAMBOCOR) 100 MG tablet   Oral   Take 1 tablet (100 mg total) by mouth every 12 (twelve) hours.   180 tablet   3   . Fluticasone-Salmeterol (ADVAIR) 250-50 MCG/DOSE AEPB   Inhalation   Inhale 1 puff into the lungs every 12 (twelve) hours.         . furosemide (LASIX) 20 MG tablet   Oral   Take 1 tablet (20 mg total) by mouth daily.   90 tablet   3     Decrease in dose on next refill   . Glucosamine-Chondroit-Vit C-Mn (GLUCOSAMINE CHONDR 1500 COMPLX PO)   Oral   Take 1 tablet by mouth daily.         . Multiple Vitamins-Minerals (ICAPS PO)   Oral   Take 1 tablet by mouth 2 (two) times daily.         . Nebivolol HCl 20 MG TABS   Oral   Take 1 tablet (20 mg total) by mouth daily.   90 tablet   3   . omeprazole (PRILOSEC) 20 MG capsule   Oral   Take 20 mg by mouth daily.         . Rivaroxaban (XARELTO) 20 MG TABS tablet   Oral   Take 20 mg by mouth daily.          BP 124/63  Pulse 72  Temp(Src) 99.4 F (37.4 C)  Resp 19  Ht 5\' 6"  (1.676 m)  Wt 234 lb (106.142 kg)  BMI 37.79 kg/m2  SpO2 98% Physical Exam  Nursing note and vitals reviewed. Constitutional: She is oriented to person, place, and time. She appears well-developed and well-nourished. No distress.  HENT:  Head: Normocephalic and atraumatic.  Eyes: Conjunctivae are normal.  Neck: Normal range of motion.  Cardiovascular: Normal rate and regular rhythm.  Exam reveals no gallop and no friction rub.   No murmur heard. Pulmonary/Chest: Effort normal. She has no wheezes. She has rales. She exhibits no tenderness.  Crackles and rales noted at bilateral lung bases.   Abdominal: Soft. She exhibits no distension.  There is no tenderness. There is no rebound and no guarding.  Musculoskeletal: Normal range of motion.  Neurological: She is alert and oriented to person, place, and time. Coordination normal.  Speech is goal-oriented. Moves limbs without ataxia.   Skin: Skin is warm and dry.  Psychiatric: She has a normal mood and affect. Her behavior is normal.    ED Course   Procedures (including critical care time)   Date: 09/19/2012  Rate: 77  Rhythm: normal sinus rhythm  QRS Axis: left  Intervals: QT prolonged  ST/T Wave abnormalities: normal  Conduction Disutrbances:left bundle branch block  Narrative Interpretation: NSR with LBBB unchanged from previous  Old EKG Reviewed: unchanged    Labs Reviewed  CBC - Abnormal; Notable for the following:    WBC 16.9 (*)    RBC 3.30 (*)    Hemoglobin 9.2 (*)    HCT 28.8 (*)    All other components within normal limits  BASIC METABOLIC PANEL - Abnormal; Notable for the following:    Glucose, Bld 134 (*)    BUN 24 (*)    Creatinine, Ser 1.50 (*)    GFR calc non Af Amer 32 (*)    GFR calc Af Amer 38 (*)    All other components within normal limits  PRO B NATRIURETIC PEPTIDE - Abnormal; Notable for the following:    Pro B Natriuretic peptide (BNP) 1097.0 (*)    All other components within normal limits  URINALYSIS, ROUTINE W REFLEX MICROSCOPIC - Abnormal; Notable for the following:    Nitrite POSITIVE (*)    Leukocytes, UA SMALL (*)    All other components within normal limits  CBC - Abnormal; Notable for the following:    WBC 13.7 (*)    RBC 2.94 (*)    Hemoglobin 8.1 (*)    HCT 25.7 (*)    All other components within normal limits  BASIC METABOLIC PANEL - Abnormal; Notable for the following:    Potassium 3.4 (*)    Glucose, Bld 121 (*)    BUN 26 (*)    Creatinine, Ser 1.61 (*)    Calcium 8.3 (*)    GFR calc non Af Amer 30 (*)    GFR calc Af Amer 34 (*)    All other components within normal limits  URINE MICROSCOPIC-ADD ON -  Abnormal; Notable for the following:    Squamous Epithelial / LPF FEW (*)    Bacteria, UA MANY (*)    All other components within normal limits  URINE CULTURE  CULTURE, BLOOD (ROUTINE X 2)  CULTURE, BLOOD (ROUTINE X 2)  TSH  POCT I-STAT TROPONIN I   Dg Chest 2 View (if Patient Has Fever And/or Copd)  09/19/2012   *RADIOLOGY REPORT*  Clinical Data: Short of breath.  Fever.  CHEST - 2 VIEW  Comparison: 07/12/2012.  Findings: Small focus of airspace disease is present in the anterior left lower lobe, evident in the retrocardiac region. The appearance is compatible with pneumonia.  Lower cervical ACDF. Cardiopericardial silhouette appears within normal limits.  Aortic arch atherosclerosis.  The right lung appears clear. Monitoring leads are projected over the chest. Follow-up to ensure radiographic clearing recommended.  Clearing is usually observed at 8 weeks.  IMPRESSION: Left lower lobe pneumonia.   Original Report Authenticated By: Andreas Newport, M.D.   1. HCAP (healthcare-associated pneumonia)   2. Acute asthma exacerbation  3. Anxiety   4. Chronic anticoagulation   5. Paroxysmal atrial fibrillation     MDM  6:29 PM Patient's xray shows left lower lobe pneumonia. Labs show elevated WBC at 16.9. BNP slightly elevated. Patient will be treated for HCAP due to hospitalization 2 months ago. Patient will be started on Vanc and Zosyn. Vitals stable and patient afebrile.     Emilia Beck, [place] 09/20/12 [phone]

## 2012-09-19 NOTE — H&P (Signed)
[facility] History and Physical  DERENDA GIDDINGS JXB:[id number] DOB: [birth year]    PCP:   Enrique Sack, MD   Chief Complaint: shortness of breath and dry cough.  HPI: Jordan Byrd is an 79 y.o. female with hx of asthma, chronic diastolic heart failure, P.A.F on Xarelto, HTN, presents to the ER with 3 days hx of increase shortness of breath.  She also has a dry cough, but no fever or chills.  She denied any orthopnea or PND.  She has been on chronic oxygen, but recently had been non compliance with it.  She had been on intermittent prednisone, last use was several months ago.  Evalaution in the ER included a CXR which showed LLL PNA, a leukocytosis with WBC of 16K, and Hb of 9.2g/DL, her renal Fx test was 1.5, and unremarkable electrolytes.  Hospitalist was asked to admit her for HCAP.    Rewiew of Systems:  Constitutional: Negative for malaise, fever and chills. No significant weight loss or weight gain Eyes: Negative for eye pain, redness and discharge, diplopia, visual changes, or flashes of light. ENMT: Negative for ear pain, hoarseness, nasal congestion, sinus pressure and sore throat. No headaches; tinnitus, drooling, or problem swallowing. Cardiovascular: Negative for chest pain, palpitations, diaphoresis,  and peripheral edema. ; No orthopnea, PND Respiratory: Negative for hemoptysis,  and stridor. No pleuritic chestpain. Gastrointestinal: Negative for nausea, vomiting, diarrhea, constipation, abdominal pain, melena, blood in stool, hematemesis, jaundice and rectal bleeding.    Genitourinary: Negative for frequency, dysuria, incontinence,flank pain and hematuria; Musculoskeletal: Negative for back pain and neck pain. Negative for swelling and trauma.;  Skin: . Negative for pruritus, rash, abrasions, bruising and skin lesion.; ulcerations Neuro: Negative for headache, lightheadedness and neck stiffness. Negative for weakness, altered level of consciousness , altered mental  status, extremity weakness, burning feet, involuntary movement, seizure and syncope.  Psych: negative for anxiety, depression, insomnia, tearfulness, panic attacks, hallucinations, paranoia, suicidal or homicidal ideation    Past Medical History  Diagnosis Date  . Asthma   . Shortness of breath   . Arthritis   . Anxiety   . Hypertension   . Atrial fibrillation     a. 04/04/11  . Osteoarthritis     a. 03/28/11 - R Total Hip Arthroplasty  . Cataracts, both eyes   . Pneumonia   . Bronchitis   . Pleurisy   . Ankle swelling   . Kidney infection   . Frequent urination   . Excessive urination at night   . Chronic headaches   . Weight gain   . CHF (congestive heart failure)     Past Surgical History  Procedure Laterality Date  . Cardiac catheterization      1980's or 90's - reportedly normal  . Abdominal hysterectomy  1962  . Appendectomy  1954  . Cholecystectomy    . Back surgery  1990  . Cataract extraction bilateral w/ anterior vitrectomy  2007/2008  . Total hip arthroplasty  03/28/2011    Procedure: TOTAL HIP ARTHROPLASTY;  Surgeon: Thera Flake., MD;  Location: [facility] OR;  Service: Orthopedics;  Laterality: Right;  . Cardioversion  04/05/2011    Procedure: CARDIOVERSION;  Surgeon: Gardiner Rhyme, MD;  Location: [facility] OR;  Service: Cardiovascular;  Laterality: N/A;  . Tonsillectomy  1943  . Adenoidectomy  1949  . Foot surgery  1947  . Thoracic disc surgery  2008  . Lumbar disc surgery  2010  . Knee surgery  both knees  . Shoulder surgery      left  . Total hip arthroplasty  03/28/2011    right  . Cardioversion N/A 06/19/2012    Procedure: CARDIOVERSION-bedside(3W36);  Surgeon: Lewayne Bunting, MD;  Location: [facility] OR;  Service: Cardiovascular;  Laterality: N/A;    Medications:  HOME MEDS: Prior to Admission medications   Medication Sig Start Date End Date Taking? Authorizing Provider  acetaminophen (TYLENOL) 325 MG tablet Take 650 mg by mouth 2 (two) times daily. For  temp above 102   Yes Historical Provider, MD  albuterol (PROVENTIL HFA;VENTOLIN HFA) 108 (90 BASE) MCG/ACT inhaler Inhale 2 puffs into the lungs every 6 (six) hours as needed for wheezing.   Yes Historical Provider, MD  amLODipine (NORVASC) 10 MG tablet Take 10 mg by mouth daily.   Yes Historical Provider, MD  benzonatate (TESSALON) 100 MG capsule Take 1 capsule (100 mg total) by mouth 3 (three) times daily as needed for cough. 06/19/12  Yes Marinda Elk, MD  calcium carbonate (OS-CAL) 600 MG TABS Take 600 mg by mouth 2 (two) times daily with a meal.   Yes Historical Provider, MD  chlorzoxazone (PARAFON) 500 MG tablet Take 250 mg by mouth 3 (three) times daily as needed for muscle spasms.   Yes Historical Provider, MD  flecainide (TAMBOCOR) 100 MG tablet Take 1 tablet (100 mg total) by mouth every 12 (twelve) hours. 07/26/12  Yes Rosalio Macadamia, NP  Fluticasone-Salmeterol (ADVAIR) 250-50 MCG/DOSE AEPB Inhale 1 puff into the lungs every 12 (twelve) hours.   Yes Historical Provider, MD  furosemide (LASIX) 20 MG tablet Take 1 tablet (20 mg total) by mouth daily. 09/08/12  Yes Hillis Range, MD  Glucosamine-Chondroit-Vit C-Mn (GLUCOSAMINE CHONDR 1500 COMPLX PO) Take 1 tablet by mouth daily.   Yes Historical Provider, MD  Multiple Vitamins-Minerals (ICAPS PO) Take 1 tablet by mouth 2 (two) times daily.   Yes Historical Provider, MD  Nebivolol HCl 20 MG TABS Take 1 tablet (20 mg total) by mouth daily. 07/26/12  Yes Rosalio Macadamia, NP  omeprazole (PRILOSEC) 20 MG capsule Take 20 mg by mouth daily.   Yes Historical Provider, MD  Rivaroxaban (XARELTO) 20 MG TABS tablet Take 20 mg by mouth daily.   Yes Historical Provider, MD     Allergies:  Allergies  Allergen Reactions  . Aspirin Nausea And Vomiting  . Aspirin Buf(Alhyd-Mghyd-Cacar) Nausea And Vomiting  . Butazolidin [Phenylbutazone] Other (See Comments)    unknown  . Celebrex [Celecoxib] Other (See Comments)    unknown  . Codeine Nausea And  Vomiting  . Doripenem Other (See Comments)    unknown  . Methocarbamol Hives  . Temazepam Other (See Comments)    unknown  . Vicodin [Hydrocodone-Acetaminophen]     Interacts with bp medication and drops blood pressure    Social History:   reports that she quit smoking about 42 years ago. Her smoking use included Cigarettes. She smoked 0.00 packs per day for 21 years. She has never used smokeless tobacco. She reports that she does not drink alcohol or use illicit drugs.  Family History: Family History  Problem Relation Age of Onset  . Diabetes Father     Father, Mother, 4 sisters (2 living)  . Heart attack Father     (Deceased)  . Heart failure Father     (deceased 34)  . Stroke Mother     (deceased 42)  . Cancer Sister     (deceased)  . Hypertension  Father   . Hypertension Mother   . Heart attack Sister   . Diabetes Sister      Physical Exam: Filed Vitals:   09/19/12 1642 09/19/12 1711 09/19/12 1815  BP: 128/48 138/63 124/63  Pulse: 73  72  Temp: 99.4 F (37.4 C)    Resp: 25  19  Height: 5\' 6"  (1.676 m)    Weight: 106.142 kg (234 lb)    SpO2: 97%  98%   Blood pressure 124/63, pulse 72, temperature 99.4 F (37.4 C), resp. rate 19, height 5\' 6"  (1.676 m), weight 106.142 kg (234 lb), SpO2 98.00%.  GEN:  Pleasant patient lying in the stretcher in no acute distress; cooperative with exam. PSYCH:  alert and oriented x4; does not appear anxious or depressed; affect is appropriate. HEENT: Mucous membranes pink and anicteric; PERRLA; EOM intact; no cervical lymphadenopathy nor thyromegaly or carotid bruit; no JVD; There were no stridor. Neck is very supple. Breasts:: Not examined CHEST WALL: No tenderness CHEST: Normal respiration, there are bilateral wheezing, with bisilar rales. HEART: Regular rate and rhythm.  There are no murmur, rub, or gallops.   BACK: No kyphosis or scoliosis; no CVA tenderness ABDOMEN: soft and non-tender; no masses, no organomegaly, normal  abdominal bowel sounds; no pannus; no intertriginous candida. There is no rebound and no distention. Rectal Exam: Not done EXTREMITIES: No bone or joint deformity; age-appropriate arthropathy of the hands and knees; no edema; no ulcerations.  There is no calf tenderness. Genitalia: not examined PULSES: 2+ and symmetric SKIN: Normal hydration no rash or ulceration CNS: Cranial nerves 2-12 grossly intact no focal lateralizing neurologic deficit.  Speech is fluent; uvula elevated with phonation, facial symmetry and tongue midline. DTR are normal bilaterally, cerebella exam is intact, barbinski is negative and strengths are equaled bilaterally.  No sensory loss.   Labs on Admission:  Basic Metabolic Panel:  Recent Labs Lab 09/19/12 1713  NA 138  K 3.9  CL 98  CO2 27  GLUCOSE 134*  BUN 24*  CREATININE 1.50*  CALCIUM 8.9   Liver Function Tests: No results found for this basename: AST, ALT, ALKPHOS, BILITOT, PROT, ALBUMIN,  in the last 168 hours No results found for this basename: LIPASE, AMYLASE,  in the last 168 hours No results found for this basename: AMMONIA,  in the last 168 hours CBC:  Recent Labs Lab 09/19/12 1713  WBC 16.9*  HGB 9.2*  HCT 28.8*  MCV 87.3  PLT 356   Cardiac Enzymes: No results found for this basename: CKTOTAL, CKMB, CKMBINDEX, TROPONINI,  in the last 168 hours  CBG: No results found for this basename: GLUCAP,  in the last 168 hours   Radiological Exams on Admission: Dg Chest 2 View (if Patient Has Fever And/or Copd)  09/19/2012   *RADIOLOGY REPORT*  Clinical Data: Short of breath.  Fever.  CHEST - 2 VIEW  Comparison: 07/12/2012.  Findings: Small focus of airspace disease is present in the anterior left lower lobe, evident in the retrocardiac region. The appearance is compatible with pneumonia.  Lower cervical ACDF. Cardiopericardial silhouette appears within normal limits.  Aortic arch atherosclerosis.  The right lung appears clear. Monitoring leads  are projected over the chest. Follow-up to ensure radiographic clearing recommended.  Clearing is usually observed at 8 weeks.  IMPRESSION: Left lower lobe pneumonia.   Original Report Authenticated By: Andreas Newport, M.D.    Assessment/Plan Present on Admission:  . HCAP (healthcare-associated pneumonia) . Acute on chronic diastolic heart failure .  Anxiety . Hypertension . Paroxysmal atrial fibrillation . CKD (chronic kidney disease) stage 3, GFR 30-59 ml/min . Acute asthma exacerbation  PLAN: I think she has several reasons for her shortness of breath among which are PNA, asthmatic exacerbation, and mild diastolic CHF.  Will admit her for IV antibiotics with VAN/ZOSYN.  She will need supplemental oxygen, and gentle diuresis.  She will need to get neb tx along with steroids.  WIll give PO Prednisone.   For her CKD, will be careful with her Cr.  Her other medical problems are stable, and her meds will be continued.  She is stable, full code, and will be admitted to [facility] service.  Thank you for allowing me to participate in the care of this nice patient.  Other plans as per orders.  Code Status: FULL Unk Lightning, MD. [facility] Pager [phone] 7pm to 7am.  09/19/2012, 7:47 PM

## 2012-09-19 NOTE — Telephone Encounter (Signed)
Pt's grandson called the answering svc reporting that his grandmother has had increased DOE, hypoxia with O2 sat's dropping into the high 80s walking from one room to another, wheezing, shallow inspiration and mildly increased respiratory effort this morning. She has a nonproductive cough. She is not having subjective fevers, chills, no weight gain, LE edema or increased abdominal girth. She continues to take Lasix as prescribed. This has been cut back recently due to renal insufficiency. She had been been needing nor using her home O2 and sent it back as recommended. The patient has a history of asthma and diastolic dysfunction. Nonproductive cough and wheezing suggests a bronchospastic etiology supporting asthma/bronchitis, potentially early decompensated CHF (although no edema nor weight gain). The patient takes Advair and has albuterol. Advised to continue Advair and albuterol PRN as prescribed. Advised to take an additional Lasix this AM. Continue to monitor O2 sats. Will continue to monitor today and follow-up with PCP tomorrow. If hypoxia or respiratory status acutely worsens, advised to present to the nearest ED. He understood and agreed. May need to consider restarting home O2 if hypoxia persists.    Jacqulyn Bath, PA-C 09/19/2012 8:27 AM

## 2012-09-19 NOTE — Progress Notes (Signed)
Pt admitted to 4E07 at 2118 came by stretcher, no family member at the bedside. Pt AO x 4, VSS, c/o SOB, denies any pain at this time. Pt oriented to the unit and to her room, HF education started. Pt encouraged to call for assistance to get OOB to prevent any fall or injury while in the hospital. Bed alarm in place for the first 24 Hr. For fall precaution. Pt resting on bed with 3L O2 [place], no distress noticed. We'll continue with POC.

## 2012-09-20 LAB — BASIC METABOLIC PANEL
BUN: 26 mg/dL — ABNORMAL HIGH (ref 6–23)
CO2: 27 mEq/L (ref 19–32)
Calcium: 8.3 mg/dL — ABNORMAL LOW (ref 8.4–10.5)
Creatinine, Ser: 1.61 mg/dL — ABNORMAL HIGH (ref 0.50–1.10)
Glucose, Bld: 121 mg/dL — ABNORMAL HIGH (ref 70–99)

## 2012-09-20 LAB — CBC
Hemoglobin: 8.1 g/dL — ABNORMAL LOW (ref 12.0–15.0)
MCH: 27.6 pg (ref 26.0–34.0)
MCV: 87.4 fL (ref 78.0–100.0)
RBC: 2.94 MIL/uL — ABNORMAL LOW (ref 3.87–5.11)

## 2012-09-20 MED ORDER — ALBUTEROL SULFATE (5 MG/ML) 0.5% IN NEBU
2.5000 mg | INHALATION_SOLUTION | Freq: Three times a day (TID) | RESPIRATORY_TRACT | Status: DC
  Administered 2012-09-21 – 2012-09-22 (×4): 2.5 mg via RESPIRATORY_TRACT
  Filled 2012-09-20 (×4): qty 0.5

## 2012-09-20 MED ORDER — FUROSEMIDE 10 MG/ML IJ SOLN
40.0000 mg | Freq: Every day | INTRAMUSCULAR | Status: DC
  Filled 2012-09-20: qty 4

## 2012-09-20 MED ORDER — POTASSIUM CHLORIDE CRYS ER 20 MEQ PO TBCR
40.0000 meq | EXTENDED_RELEASE_TABLET | Freq: Once | ORAL | Status: AC
  Administered 2012-09-20: 40 meq via ORAL
  Filled 2012-09-20: qty 2

## 2012-09-20 NOTE — Progress Notes (Signed)
[facility]                                                                                Patient Demographics  Rheba Byrd, is a 79 y.o. female, DOB - [birth year], ZOX:[id number], WJX:[id number]  Admit date - 09/19/2012  Admitting Physician Houston Siren, MD  Outpatient Primary MD for the patient is GREEN, Lorenda Ishihara, MD  LOS - 1   Chief Complaint  Patient presents with  . Shortness of Breath  . Fever        Assessment & Plan    1.SOB due to combination of  HCAP + mild COPD and acute on chronic diastolic CHF exacerbation - she is feeling much better on empiric antibiotics, low dose oral steroids will be continued, gentle diuresis with close eye on BMP and fluid status. Oxygen and nebulizer treatment as needed. Follow culture results.   2. Paroxysmal atrial fibrillation on chronic xaralto . Goal will be rate controlled, patient is on Bystolic along with flecainide, recent echo gram from this ear below.   Echogram  - Left ventricle: The cavity size was mildly dilated. Wall thickness was normal. Systolic function was normal. The estimated ejection fraction was in the range of 55% to 60%. - Mitral valve: Mild regurgitation. - Left atrium: The atrium was mildly dilated.    3. Chronic kidney disease stage IV. Baseline creatinine is around 1.6, currently around her baseline we will monitor closely and adjust diuretic dose as needed.   4. Hypertension- stable on Norvasc and systolic will be continued.    5. GERD-continue PPI    6. UTI. Continue antibiotics monitor cultures.    Code Status: Full  Family Communication: none present  Disposition Plan: Home   Procedures     Consults      DVT Prophylaxis  Xaralto  Lab Results  Component Value Date   PLT 314 09/20/2012    Medications  Scheduled Meds: . albuterol  2.5 mg Nebulization Q6H  . amLODipine  10 mg Oral Daily  . calcium carbonate  1 tablet Oral BID WC  . docusate sodium  100 mg Oral BID  .  flecainide  100 mg Oral Q12H  . [START ON 09/21/2012] furosemide  40 mg Intravenous Daily  . mometasone-formoterol  2 puff Inhalation BID  . nebivolol  20 mg Oral Daily  . pantoprazole  40 mg Oral Daily  . piperacillin-tazobactam (ZOSYN)  IV  3.375 g Intravenous Q8H  . predniSONE  40 mg Oral QAC breakfast  . rivaroxaban  15 mg Oral Q supper  . sodium chloride  3 mL Intravenous Q12H  . sodium chloride  3 mL Intravenous Q12H  . vancomycin  1,500 mg Intravenous Q24H   Continuous Infusions:  PRN Meds:.sodium chloride, acetaminophen, albuterol, morphine injection, ondansetron (ZOFRAN) IV, ondansetron, sodium chloride  Antibiotics     Anti-infectives   Start     Dose/Rate Route Frequency Ordered Stop   09/20/12 0800  vancomycin (VANCOCIN) 1,500 mg in sodium chloride 0.9 % 500 mL IVPB     1,500 mg 250 mL/hr over 120 Minutes Intravenous Every 24 hours 09/19/12 2126     09/19/12 2200  piperacillin-tazobactam (ZOSYN) IVPB 3.375  g  Status:  Discontinued     3.375 g 100 mL/hr over 30 Minutes Intravenous 3 times per day 09/19/12 2100 09/19/12 2128   09/19/12 2200  piperacillin-tazobactam (ZOSYN) IVPB 3.375 g     3.375 g 12.5 mL/hr over 240 Minutes Intravenous 3 times per day 09/19/12 2130     09/19/12 1900  vancomycin (VANCOCIN) 1,250 mg in sodium chloride 0.9 % 250 mL IVPB     1,250 mg 166.7 mL/hr over 90 Minutes Intravenous  Once 09/19/12 1832 09/19/12 2135   09/19/12 1845  piperacillin-tazobactam (ZOSYN) IVPB 3.375 g     3.375 g 12.5 mL/hr over 240 Minutes Intravenous  Once 09/19/12 1832 09/19/12 1927       Time Spent in minutes  35   Albion Weatherholtz K M.D on 09/20/2012 at 10:11 AM  Between 7am to 7pm - Pager - [phone]  After 7pm go to [email or web address] - password [email or web address]  And look for the night coverage person covering for me after hours  [facility] Office  [phone]    Subjective:   Jordan Byrd today has, No headache, No chest pain, No abdominal pain - No  Nausea, No new weakness tingling or numbness, No Cough - Improved SOB.   Objective:   Filed Vitals:   09/20/12 0140 09/20/12 0450 09/20/12 0604 09/20/12 0755  BP: 124/66 116/96    Pulse: 72 79    Temp: 98.6 F (37 C) 101.3 F (38.5 C) 98.5 F (36.9 C)   TempSrc: Oral Oral Oral   Resp: 20 22    Height:      Weight:  106.459 kg (234 lb 11.2 oz)    SpO2: 97% 97%  97%    Wt Readings from Last 3 Encounters:  09/20/12 106.459 kg (234 lb 11.2 oz)  09/08/12 106.142 kg (234 lb)  08/17/12 107.865 kg (237 lb 12.8 oz)     Intake/Output Summary (Last 24 hours) at 09/20/12 1011 Last data filed at 09/20/12 0839  Gross per 24 hour  Intake    950 ml  Output   1525 ml  Net   -575 ml    Exam Awake Alert, Oriented X 3, No new F.N deficits, Normal affect [place].AT,PERRAL Supple Neck,No JVD, No cervical lymphadenopathy appriciated.  Symmetrical Chest wall movement, Good air movement bilaterally, + wheezing RRR,No Gallops,Rubs or new Murmurs, No Parasternal Heave +ve B.Sounds, Abd Soft, Non tender, No organomegaly appriciated, No rebound - guarding or rigidity. No Cyanosis, Clubbing or edema, No new Rash or bruise      Data Review   Micro Results No results found for this or any previous visit (from the past 240 hour(s)).  Radiology Reports Dg Chest 2 View (if Patient Has Fever And/or Copd)  09/19/2012   *RADIOLOGY REPORT*  Clinical Data: Short of breath.  Fever.  CHEST - 2 VIEW  Comparison: 07/12/2012.  Findings: Small focus of airspace disease is present in the anterior left lower lobe, evident in the retrocardiac region. The appearance is compatible with pneumonia.  Lower cervical ACDF. Cardiopericardial silhouette appears within normal limits.  Aortic arch atherosclerosis.  The right lung appears clear. Monitoring leads are projected over the chest. Follow-up to ensure radiographic clearing recommended.  Clearing is usually observed at 8 weeks.  IMPRESSION: Left lower lobe pneumonia.    Original Report Authenticated By: Andreas Newport, M.D.    CBC  Recent Labs Lab 09/19/12 1713 09/20/12 0430  WBC 16.9* 13.7*  HGB 9.2* 8.1*  HCT 28.8* 25.7*  PLT 356  314  MCV 87.3 87.4  MCH 27.9 27.6  MCHC 31.9 31.5  RDW 14.1 14.5    Chemistries   Recent Labs Lab 09/19/12 1713 09/20/12 0430  NA 138 139  K 3.9 3.4*  CL 98 98  CO2 27 27  GLUCOSE 134* 121*  BUN 24* 26*  CREATININE 1.50* 1.61*  CALCIUM 8.9 8.3*   ------------------------------------------------------------------------------------------------------------------ estimated creatinine clearance is 36.1 ml/min (by C-G formula based on Cr of 1.61). ------------------------------------------------------------------------------------------------------------------ No results found for this basename: HGBA1C,  in the last 72 hours ------------------------------------------------------------------------------------------------------------------ No results found for this basename: CHOL, HDL, LDLCALC, TRIG, CHOLHDL, LDLDIRECT,  in the last 72 hours ------------------------------------------------------------------------------------------------------------------ No results found for this basename: TSH, T4TOTAL, FREET3, T3FREE, THYROIDAB,  in the last 72 hours ------------------------------------------------------------------------------------------------------------------ No results found for this basename: VITAMINB12, FOLATE, FERRITIN, TIBC, IRON, RETICCTPCT,  in the last 72 hours  Coagulation profile No results found for this basename: INR, PROTIME,  in the last 168 hours  No results found for this basename: DDIMER,  in the last 72 hours  Cardiac Enzymes No results found for this basename: CK, CKMB, TROPONINI, MYOGLOBIN,  in the last 168 hours ------------------------------------------------------------------------------------------------------------------ No components found with this basename: POCBNP,

## 2012-09-20 NOTE — Evaluation (Signed)
Physical Therapy Evaluation Patient Details Name: Jordan Byrd MRN: [id number] DOB: 1933/02/23 Today's Date: 09/20/2012 Time: 1308-6578 PT Time Calculation (min): 15 min  PT Assessment / Plan / Recommendation History of Present Illness  79 y.o. female with hx of asthma, chronic diastolic heart failure, P.A.F on Xarelto, HTN, presents to the ER with 3 days hx of increase shortness of breath.  Admitted for HCAP.  Clinical Impression  Pt admitted with above. Pt currently with functional limitations due to the deficits listed below (see PT Problem List).  Pt will benefit from skilled PT to increase their independence and safety with mobility to allow discharge to the venue listed below.  Pt states she has had HH services in the past and declines HHPT at this time.  Pt also states grandson lives 2 doors down and available at all times if needed.     PT Assessment  Patient needs continued PT services    Follow Up Recommendations  Home health PT;Supervision for mobility/OOB (however pt declines any HH services)    Does the patient have the potential to tolerate intense rehabilitation      Barriers to Discharge        Equipment Recommendations  None recommended by PT    Recommendations for Other Services     Frequency Min 3X/week    Precautions / Restrictions Precautions Precaution Comments: monitor sats   Pertinent Vitals/Pain SaO2 99% on 2L O2 [place] at rest Pt preferred ambulating on O2 due to dyspnea and states checked earlier and dropped to 77% on room air after using restroom with nsg tech. SaO2 98% on 2L O2 [place] after ambulation.      Mobility  Bed Mobility Bed Mobility: Not assessed Details for Bed Mobility Assistance: pt up in recliner Transfers Transfers: Sit to Stand;Stand to Sit Sit to Stand: 4: Min guard;With upper extremity assist;From chair/3-in-1 Stand to Sit: 4: Min guard;With upper extremity assist;To chair/3-in-1 Details for Transfer Assistance: verbal cues for  safe technique Ambulation/Gait Ambulation/Gait Assistance: 4: Min guard Ambulation Distance (Feet): 40 Feet Assistive device: Rolling walker Ambulation/Gait Assistance Details: SOB and fatigue limiting distance, ambulated on 2L O2 [place] with SaO2 98% upon return to chair Gait Pattern: Step-through pattern;Decreased stride length;Trunk flexed Gait velocity: decreased    Exercises     PT Diagnosis: Difficulty walking  PT Problem List: Decreased strength;Decreased activity tolerance;Decreased mobility;Decreased safety awareness;Decreased knowledge of use of DME;Cardiopulmonary status limiting activity PT Treatment Interventions: Gait training;DME instruction;Functional mobility training;Therapeutic activities;Therapeutic exercise;Patient/family education     PT Goals(Current goals can be found in the care plan section) Acute Rehab PT Goals PT Goal Formulation: With patient Time For Goal Achievement: 09/27/12 Potential to Achieve Goals: Good  Visit Information  Last PT Received On: 09/20/12 Assistance Needed: +1 History of Present Illness: 79 y.o. female with hx of asthma, chronic diastolic heart failure, P.A.F on Xarelto, HTN, presents to the ER with 3 days hx of increase shortness of breath.  Admitted for HCAP.       Prior Functioning  Home Living Family/patient expects to be discharged to:: Private residence Living Arrangements: Alone Available Help at Discharge: Family;Available PRN/intermittently Type of Home: House Home Access: Ramped entrance Home Layout: One level Home Equipment: Walker - 2 wheels;Cane - single point Prior Function Level of Independence: Independent with assistive device(s) Communication Communication: No difficulties    Cognition  Cognition Arousal/Alertness: Awake/alert Behavior During Therapy: WFL for tasks assessed/performed Overall Cognitive Status: Within Functional Limits for tasks assessed    Extremity/Trunk  Assessment Lower Extremity  Assessment Lower Extremity Assessment: Generalized weakness   Balance    End of Session PT - End of Session Equipment Utilized During Treatment: Oxygen Activity Tolerance: Patient limited by fatigue;Other (comment) (dyspnea) Patient left: in chair;with call bell/phone within reach  GP     [facility] 09/20/2012, 3:59 PM Zenovia Jarred, PT, DPT 09/20/2012 Pager: [phone]

## 2012-09-20 NOTE — Progress Notes (Signed)
Utilization Review Completed.   Nitisha Civello, RN, BSN Nurse Case Manager  [phone]  

## 2012-09-20 NOTE — Telephone Encounter (Signed)
See telephone note.

## 2012-09-20 NOTE — Progress Notes (Signed)
Pt is alert and oriented x4. Pt has had no complaints of pain. Pt is sitting up in chair and watching tv. Will continue to monitor

## 2012-09-21 LAB — BASIC METABOLIC PANEL
BUN: 28 mg/dL — ABNORMAL HIGH (ref 6–23)
CO2: 30 mEq/L (ref 19–32)
Chloride: 101 mEq/L (ref 96–112)
Creatinine, Ser: 1.48 mg/dL — ABNORMAL HIGH (ref 0.50–1.10)

## 2012-09-21 LAB — URINE CULTURE

## 2012-09-21 LAB — CBC
HCT: 25.9 % — ABNORMAL LOW (ref 36.0–46.0)
MCV: 87.5 fL (ref 78.0–100.0)
RBC: 2.96 MIL/uL — ABNORMAL LOW (ref 3.87–5.11)
WBC: 12.3 10*3/uL — ABNORMAL HIGH (ref 4.0–10.5)

## 2012-09-21 MED ORDER — VANCOMYCIN HCL 10 G IV SOLR
1500.0000 mg | INTRAVENOUS | Status: DC
  Filled 2012-09-21: qty 1500

## 2012-09-21 MED ORDER — FUROSEMIDE 10 MG/ML IJ SOLN
40.0000 mg | Freq: Two times a day (BID) | INTRAMUSCULAR | Status: DC
  Administered 2012-09-21 – 2012-09-22 (×3): 40 mg via INTRAVENOUS
  Filled 2012-09-21: qty 4

## 2012-09-21 NOTE — Progress Notes (Signed)
[facility]                                                                                Patient Demographics  Jordan Byrd, is a 79 y.o. female, DOB - [birth year], ZOX:[id number], WJX:[id number]  Admit date - 09/19/2012  Admitting Physician Houston Siren, MD  Outpatient Primary MD for the patient is GREEN, Lorenda Ishihara, MD  LOS - 2   Chief Complaint  Patient presents with  . Shortness of Breath  . Fever        Assessment & Plan    1.SOB due to combination of  HCAP + mild COPD and acute on chronic diastolic CHF exacerbation - she is feeling much better on empiric antibiotics, low dose oral steroids will be continued, gentle diuresis with close eye on BMP and fluid status. Oxygen and nebulizer treatment as needed. Follow culture results.     2. Paroxysmal atrial fibrillation -  on chronic xaralto . Goal will be rate controlled, patient is on Bystolic along with flecainide, recent echo gram from this year below.   Echogram  - Left ventricle: The cavity size was mildly dilated. Wall thickness was normal. Systolic function was normal. The estimated ejection fraction was in the range of 55% to 60%. - Mitral valve: Mild regurgitation. - Left atrium: The atrium was mildly dilated.     3. Chronic kidney disease stage IV -  Baseline creatinine is around 1.6, currently around her baseline we will monitor closely and adjust diuretic dose as needed.     4. Hypertension- stable on Norvasc and Bystolic will be continued.     5. GERD-continue PPI     6. UTI. Continue antibiotics monitor cultures.     Code Status: Full  Family Communication: none present  Disposition Plan: Home   Procedures     Consults      DVT Prophylaxis  Xaralto  Lab Results  Component Value Date   PLT 359 09/21/2012    Medications  Scheduled Meds: . albuterol  2.5 mg Nebulization TID  . amLODipine  10 mg Oral Daily  . calcium carbonate  1 tablet Oral BID WC  . docusate sodium   100 mg Oral BID  . flecainide  100 mg Oral Q12H  . furosemide  40 mg Intravenous BID  . mometasone-formoterol  2 puff Inhalation BID  . nebivolol  20 mg Oral Daily  . pantoprazole  40 mg Oral Daily  . piperacillin-tazobactam (ZOSYN)  IV  3.375 g Intravenous Q8H  . predniSONE  40 mg Oral QAC breakfast  . rivaroxaban  15 mg Oral Q supper  . sodium chloride  3 mL Intravenous Q12H  . sodium chloride  3 mL Intravenous Q12H  . vancomycin  1,500 mg Intravenous Q24H   Continuous Infusions:  PRN Meds:.sodium chloride, acetaminophen, albuterol, morphine injection, ondansetron (ZOFRAN) IV, ondansetron, sodium chloride  Antibiotics     Anti-infectives   Start     Dose/Rate Route Frequency Ordered Stop   09/20/12 0800  vancomycin (VANCOCIN) 1,500 mg in sodium chloride 0.9 % 500 mL IVPB     1,500 mg 250 mL/hr over 120 Minutes Intravenous Every 24 hours 09/19/12 2126  09/19/12 2200  piperacillin-tazobactam (ZOSYN) IVPB 3.375 g  Status:  Discontinued     3.375 g 100 mL/hr over 30 Minutes Intravenous 3 times per day 09/19/12 2100 09/19/12 2128   09/19/12 2200  piperacillin-tazobactam (ZOSYN) IVPB 3.375 g     3.375 g 12.5 mL/hr over 240 Minutes Intravenous 3 times per day 09/19/12 2130     09/19/12 1900  vancomycin (VANCOCIN) 1,250 mg in sodium chloride 0.9 % 250 mL IVPB     1,250 mg 166.7 mL/hr over 90 Minutes Intravenous  Once 09/19/12 1832 09/19/12 2135   09/19/12 1845  piperacillin-tazobactam (ZOSYN) IVPB 3.375 g     3.375 g 12.5 mL/hr over 240 Minutes Intravenous  Once 09/19/12 1832 09/19/12 1927       Time Spent in minutes  35   SINGH,PRASHANT K M.D on 09/21/2012 at 9:53 AM  Between 7am to 7pm - Pager - [phone]  After 7pm go to [email or web address] - password [email or web address]  And look for the night coverage person covering for me after hours  [facility] Office  [phone]    Subjective:   Jordan Byrd today has, No headache, No chest pain, No abdominal pain - No  Nausea, No new weakness tingling or numbness, No Cough - Improved SOB.   Objective:   Filed Vitals:   09/20/12 2103 09/20/12 2115 09/21/12 0552 09/21/12 0738  BP:  132/61 118/40   Pulse:  80 71   Temp:  97.8 F (36.6 C) 97.4 F (36.3 C)   TempSrc:  Oral Oral   Resp:  20 18   Height:      Weight:   106.8 kg (235 lb 7.2 oz)   SpO2: 98% 97% 100% 99%    Wt Readings from Last 3 Encounters:  09/21/12 106.8 kg (235 lb 7.2 oz)  09/08/12 106.142 kg (234 lb)  08/17/12 107.865 kg (237 lb 12.8 oz)     Intake/Output Summary (Last 24 hours) at 09/21/12 0953 Last data filed at 09/21/12 0415  Gross per 24 hour  Intake    600 ml  Output    600 ml  Net      0 ml    Exam Awake Alert, Oriented X 3, No new F.N deficits, Normal affect [place].AT,PERRAL Supple Neck,No JVD, No cervical lymphadenopathy appriciated.  Symmetrical Chest wall movement, Good air movement bilaterally, + wheezing RRR,No Gallops,Rubs or new Murmurs, No Parasternal Heave +ve B.Sounds, Abd Soft, Non tender, No organomegaly appriciated, No rebound - guarding or rigidity. No Cyanosis, Clubbing or edema, No new Rash or bruise      Data Review   Micro Results Recent Results (from the past 240 hour(s))  CULTURE, BLOOD (ROUTINE X 2)     Status: None   Collection Time    09/19/12  7:47 PM      Result Value Range Status   Specimen Description BLOOD RIGHT ARM   Final   Special Requests BOTTLES DRAWN AEROBIC AND ANAEROBIC 10CC EA   Final   Culture  Setup Time     Final   Value: 09/20/2012 09:45     Performed at [facility]   Culture     Final   Value:        BLOOD CULTURE RECEIVED NO GROWTH TO DATE CULTURE WILL BE HELD FOR 5 DAYS BEFORE ISSUING A FINAL NEGATIVE REPORT     Performed at [facility]   Report Status PENDING   Incomplete  CULTURE, BLOOD (ROUTINE X 2)  Status: None   Collection Time    09/19/12  7:59 PM      Result Value Range Status   Specimen Description BLOOD RIGHT HAND   Final    Special Requests BOTTLES DRAWN AEROBIC ONLY 10CC   Final   Culture  Setup Time     Final   Value: 09/20/2012 09:41     Performed at [facility]   Culture     Final   Value:        BLOOD CULTURE RECEIVED NO GROWTH TO DATE CULTURE WILL BE HELD FOR 5 DAYS BEFORE ISSUING A FINAL NEGATIVE REPORT     Performed at [facility]   Report Status PENDING   Incomplete  URINE CULTURE     Status: None   Collection Time    09/19/12 11:02 PM      Result Value Range Status   Specimen Description URINE, CLEAN CATCH   Final   Special Requests Normal   Final   Culture  Setup Time     Final   Value: 09/19/2012 23:49     Performed at [facility] Count     Final   Value: >=100,000 COLONIES/ML     Performed at [facility]   Culture     Final   Value: ESCHERICHIA COLI     Performed at [facility]   Report Status PENDING   Incomplete    Radiology Reports Dg Chest 2 View (if Patient Has Fever And/or Copd)  09/19/2012   *RADIOLOGY REPORT*  Clinical Data: Short of breath.  Fever.  CHEST - 2 VIEW  Comparison: 07/12/2012.  Findings: Small focus of airspace disease is present in the anterior left lower lobe, evident in the retrocardiac region. The appearance is compatible with pneumonia.  Lower cervical ACDF. Cardiopericardial silhouette appears within normal limits.  Aortic arch atherosclerosis.  The right lung appears clear. Monitoring leads are projected over the chest. Follow-up to ensure radiographic clearing recommended.  Clearing is usually observed at 8 weeks.  IMPRESSION: Left lower lobe pneumonia.   Original Report Authenticated By: Andreas Newport, M.D.    CBC  Recent Labs Lab 09/19/12 1713 09/20/12 0430 09/21/12 0500  WBC 16.9* 13.7* 12.3*  HGB 9.2* 8.1* 8.3*  HCT 28.8* 25.7* 25.9*  PLT 356 314 359  MCV 87.3 87.4 87.5  MCH 27.9 27.6 28.0  MCHC 31.9 31.5 32.0  RDW 14.1 14.5 14.3    Chemistries   Recent Labs Lab 09/19/12 1713  09/20/12 0430 09/21/12 0500  NA 138 139 141  K 3.9 3.4* 4.3  CL 98 98 101  CO2 27 27 30   GLUCOSE 134* 121* 112*  BUN 24* 26* 28*  CREATININE 1.50* 1.61* 1.48*  CALCIUM 8.9 8.3* 8.3*   ------------------------------------------------------------------------------------------------------------------ estimated creatinine clearance is 39.3 ml/min (by C-G formula based on Cr of 1.48). ------------------------------------------------------------------------------------------------------------------ No results found for this basename: HGBA1C,  in the last 72 hours ------------------------------------------------------------------------------------------------------------------ No results found for this basename: CHOL, HDL, LDLCALC, TRIG, CHOLHDL, LDLDIRECT,  in the last 72 hours ------------------------------------------------------------------------------------------------------------------  Recent Labs  09/19/12 2120  TSH 1.307   ------------------------------------------------------------------------------------------------------------------ No results found for this basename: VITAMINB12, FOLATE, FERRITIN, TIBC, IRON, RETICCTPCT,  in the last 72 hours  Coagulation profile No results found for this basename: INR, PROTIME,  in the last 168 hours  No results found for this basename: DDIMER,  in the last 72 hours  Cardiac Enzymes No results found for this basename: CK,  CKMB, TROPONINI, MYOGLOBIN,  in the last 168 hours ------------------------------------------------------------------------------------------------------------------ No components found with this basename: POCBNP,

## 2012-09-21 NOTE — Progress Notes (Signed)
The patient is A&Ox4 this morning.  She ambulated with ease to the [facility] last night with a standby assist.  She does not have any complaints this morning and did not have any acute changes overnight.

## 2012-09-22 LAB — BASIC METABOLIC PANEL
BUN: 30 mg/dL — ABNORMAL HIGH (ref 6–23)
Calcium: 8.2 mg/dL — ABNORMAL LOW (ref 8.4–10.5)
Creatinine, Ser: 1.55 mg/dL — ABNORMAL HIGH (ref 0.50–1.10)
GFR calc Af Amer: 36 mL/min — ABNORMAL LOW (ref >90)
GFR calc non Af Amer: 31 mL/min — ABNORMAL LOW (ref >90)

## 2012-09-22 LAB — CBC
HCT: 25.6 % — ABNORMAL LOW (ref 36.0–46.0)
MCH: 27.3 pg (ref 26.0–34.0)
MCHC: 31.3 g/dL (ref 30.0–36.0)
MCV: 87.4 fL (ref 78.0–100.0)
RDW: 14.2 % (ref 11.5–15.5)

## 2012-09-22 MED ORDER — ALBUTEROL SULFATE (5 MG/ML) 0.5% IN NEBU: 2.5000 mg | INHALATION_SOLUTION | Freq: Two times a day (BID) | RESPIRATORY_TRACT | Status: DC

## 2012-09-22 MED ORDER — PREDNISONE 5 MG PO TABS: ORAL_TABLET | ORAL | Status: DC

## 2012-09-22 MED ORDER — POTASSIUM CHLORIDE ER 10 MEQ PO TBCR: 20.0000 meq | EXTENDED_RELEASE_TABLET | Freq: Every day | ORAL | Status: DC

## 2012-09-22 MED ORDER — FUROSEMIDE 20 MG PO TABS: 40.0000 mg | ORAL_TABLET | Freq: Every day | ORAL | Status: DC

## 2012-09-22 MED ORDER — LEVOFLOXACIN 500 MG PO TABS: 500.0000 mg | ORAL_TABLET | Freq: Every day | ORAL | Status: DC

## 2012-09-22 NOTE — Care Management Note (Signed)
    Page 1 of 1   09/22/2012     1:58:48 PM   CARE MANAGEMENT NOTE 09/22/2012  Patient:  Jordan Byrd, Jordan Byrd   Account Number:  [id number]  Date Initiated:  09/22/2012  Documentation initiated by:  Tera Mater  Subjective/Objective Assessment:   79yo female admitted with PNA.  Pt. lives alone, however pt. son lives nearby.     Action/Plan:   discharge planning   Anticipated DC Date:  09/22/2012   Anticipated DC Plan:  HOME/SELF CARE      DC Planning Services  CM consult      Choice offered to / List presented to:             Status of service:  Completed, signed off Medicare Important Message given?   (If response is "NO", the following Medicare IM given date fields will be blank) Date Medicare IM given:   Date Additional Medicare IM given:    Discharge Disposition:  HOME/SELF CARE  Per UR Regulation:  Reviewed for med. necessity/level of care/duration of stay  If discussed at Long Length of Stay Meetings, dates discussed:    Comments:  09/22/12 1300 Noted CM referral for home oxygen if qualifies.  Pt. did not qualify for home continuous oxygen, since his r/a sats were >88%.  Pt. dc home today. Tera Mater, RN, BSN NCM [phone]

## 2012-09-22 NOTE — Progress Notes (Signed)
Pt is alert and oriented x4. Pt is on RA. Pt has no complaints of pain at this time. Pt has been experiencing a new onset of diarrhea.

## 2012-09-22 NOTE — Progress Notes (Signed)
Physical Therapy Treatment Patient Details Name: Jordan Byrd MRN: [id number] DOB: Aug 19, 1933 Today's Date: 09/22/2012 Time: 0865-7846 PT Time Calculation (min): 23 min  PT Assessment / Plan / Recommendation  History of Present Illness     PT Comments   Pt making progress with mobility.  Sp02 remained >90% RA entire session.     Follow Up Recommendations  Home health PT;Supervision for mobility/OOB     Does the patient have the potential to tolerate intense rehabilitation     Barriers to Discharge        Equipment Recommendations  None recommended by PT    Recommendations for Other Services    Frequency Min 3X/week   Progress towards PT Goals Progress towards PT goals: Progressing toward goals  Plan Current plan remains appropriate    Precautions / Restrictions Precautions Precaution Comments: monitor sats       Mobility  Bed Mobility Bed Mobility: Not assessed Transfers Transfers: Sit to Stand;Stand to Sit Sit to Stand: With upper extremity assist;5: Supervision;From bed Stand to Sit: 5: Supervision;With upper extremity assist;To bed Ambulation/Gait Ambulation/Gait Assistance: 4: Min guard Ambulation Distance (Feet): 120 Feet Assistive device: Rolling walker Ambulation/Gait Assistance Details: Sp02 >90% RA entire distance Gait Pattern: Step-through pattern;Decreased stride length Gait velocity: decreased General Gait Details: +SOB but Sp02 >90% RA.  Cues for pursed lip breathing Stairs: No Wheelchair Mobility Wheelchair Mobility: No      PT Goals (current goals can now be found in the care plan section) Acute Rehab PT Goals PT Goal Formulation: With patient Time For Goal Achievement: 09/27/12 Potential to Achieve Goals: Good  Visit Information  Last PT Received On: 09/22/12 Assistance Needed: +1    Subjective Data      Cognition  Cognition Arousal/Alertness: Awake/alert Behavior During Therapy: WFL for tasks assessed/performed Overall  Cognitive Status: Within Functional Limits for tasks assessed    Balance     End of Session PT - End of Session Activity Tolerance: Patient tolerated treatment well Patient left: with call bell/phone within reach (sitting EOB) Nurse Communication: Mobility status   GP     Lara Mulch 09/22/2012, 3:10 PM   Verdell Face, PTA [phone] 09/22/2012

## 2012-09-22 NOTE — Progress Notes (Signed)
The patient stated that she was having diarrhea at the beginning of the night shift.  She did not receive her stool softener.  Her diarrhea seemed to subside overnight, but she had another loose stool this morning.  Otherwise, she did not have any complaints this morning.

## 2012-09-22 NOTE — Progress Notes (Signed)
.  SATURATION QUALIFICATIONS: (This note is used to comply with regulatory documentation for home oxygen)  Patient Saturations on Room Air at Rest = 97%  Patient Saturations on Room Air while Ambulating = 93%  Patient Saturations on 0 Liters of oxygen while Ambulating = 93%   Pt ambulated up and down the hallway with the physical therapist on RA and the patient's O2 sats never went below 91%. Pt was experiencing some SOB but O2 saturations remained above 90%.

## 2012-09-22 NOTE — Discharge Summary (Signed)
[facility]                                                                                   Jordan Byrd, is a 79 y.o. female  DOB 1933/02/27  MRN [id number].  Admission date:  09/19/2012  Discharge Date:  09/22/2012  Primary MD  Enrique Sack, MD  Admitting Physician  Houston Siren, MD  Admission Diagnosis  Anxiety [300.00] Paroxysmal atrial fibrillation [427.31] Chronic anticoagulation [V58.61] Acute asthma exacerbation [493.92] HCAP (healthcare-associated pneumonia) [486]  Discharge Diagnosis     Principal Problem:   HCAP (healthcare-associated pneumonia) Active Problems:   Anxiety   Hypertension   Paroxysmal atrial fibrillation   Acute asthma exacerbation   CKD (chronic kidney disease) stage 3, GFR 30-59 ml/min   Acute on chronic diastolic heart failure   Chronic anticoagulation    Past Medical History  Diagnosis Date  . Asthma   . Shortness of breath   . Arthritis   . Anxiety   . Hypertension   . Atrial fibrillation     a. 04/04/11  . Osteoarthritis     a. 03/28/11 - R Total Hip Arthroplasty  . Cataracts, both eyes   . Pneumonia   . Bronchitis   . Pleurisy   . Ankle swelling   . Kidney infection   . Frequent urination   . Excessive urination at night   . Chronic headaches   . Weight gain   . CHF (congestive heart failure)     Past Surgical History  Procedure Laterality Date  . Cardiac catheterization      1980's or 90's - reportedly normal  . Abdominal hysterectomy  1962  . Appendectomy  1954  . Cholecystectomy    . Back surgery  1990  . Cataract extraction bilateral w/ anterior vitrectomy  2007/2008  . Total hip arthroplasty  03/28/2011    Procedure: TOTAL HIP ARTHROPLASTY;  Surgeon: Thera Flake., MD;  Location: [facility] OR;  Service: Orthopedics;  Laterality: Right;  . Cardioversion  04/05/2011    Procedure: CARDIOVERSION;  Surgeon: Gardiner Rhyme, MD;  Location: [facility] OR;  Service: Cardiovascular;  Laterality: N/A;  . Tonsillectomy  1943  .  Adenoidectomy  1949  . Foot surgery  1947  . Thoracic disc surgery  2008  . Lumbar disc surgery  2010  . Knee surgery       both knees  . Shoulder surgery      left  . Total hip arthroplasty  03/28/2011    right  . Cardioversion N/A 06/19/2012    Procedure: CARDIOVERSION-bedside(3W36);  Surgeon: Lewayne Bunting, MD;  Location: [facility] OR;  Service: Cardiovascular;  Laterality: N/A;     Recommendations for primary care physician for things to follow:     Follow weight, diuretic dose and BMP closely, 2 view chest x-ray in a week   Discharge Diagnoses:   Principal Problem:   HCAP (healthcare-associated pneumonia) Active Problems:   Anxiety   Hypertension   Paroxysmal atrial fibrillation   Acute asthma exacerbation   CKD (chronic kidney disease) stage 3, GFR 30-59 ml/min   Acute on chronic diastolic heart failure   Chronic anticoagulation  Discharge Condition: stable   Diet recommendation: See Discharge Instructions below   Consults      History of present illness and  Hospital Course:     Kindly see H&P for history of present illness and admission details, please review complete Labs, Consult reports and Test reports for all details in brief [name], is a 79 y.o. female, patient with history of diastolic CHF, atrial fibrillation on anticoagulation, chronic kidney disease stage IV, hypertension was admitted to the hospital with cough and shortness of breath secondary to accommodation of community-acquired pneumonia along with acute on chronic diastolic CHF and mild COPD exacerbation.    1.SOB due to combination of HCAP + mild COPD and acute on chronic diastolic CHF exacerbation - she was treated with empiric IV antibiotics along with IV Lasix and steroids, she has responded very well and is now close to baseline, she stated the symptom free, she is down to 1 L nasal cannula oxygen and we will see if we can titrate her off of it, if she still requiring oxygen ablation  home oxygen will be prescribed, of note patient was on home oxygen in one month ago. She will be placed on oral Lasix along with low potassium supplementation.she has been advised on fluid and salt restriction. She will be transitioned to oral antibiotics along with oral steroid taper, will request primary care physician to repeat 2 view chest x-ray along with CBC BMP in a week.      2. Paroxysmal atrial fibrillation - on chronic xaralto . Goal will be rate controlled, patient is on Bystolic along with flecainide, recent echo gram from this year below. Home medications will be continued unchanged upon discharge.  Echogram  - Left ventricle: The cavity size was mildly dilated. Wall thickness was normal. Systolic function was normal. The estimated ejection fraction was in the range of 55% to 60%. - Mitral valve: Mild regurgitation. - Left atrium: The atrium was mildly dilated.     3. Chronic kidney disease stage IV - Baseline creatinine is around 1.6, currently around her baseline request primary care physician to closely monitor weight and BMP and adjust diuretic dose as needed.    4. Hypertension- stable on Norvasc and Bystolic will be continued.    5. GERD-continue PPI     6. UTI. Culture is noted oral Levaquin should suffice it    Today   Subjective:   Jordan Byrd today has no headache,no chest abdominal pain,no new weakness tingling or numbness, feels much better wants to go home today.    Objective:   Blood pressure 129/68, pulse 64, temperature 97.8 F (36.6 C), temperature source Oral, resp. rate 18, height 5\' 6"  (1.676 m), weight 104 kg (229 lb 4.5 oz), SpO2 95.00%.   Intake/Output Summary (Last 24 hours) at 09/22/12 1113 Last data filed at 09/22/12 0859  Gross per 24 hour  Intake   1750 ml  Output   1202 ml  Net    548 ml    Exam Awake Alert, Oriented *3, No new F.N deficits, Normal affect [place].AT,PERRAL Supple Neck,No JVD, No cervical lymphadenopathy  appriciated.  Symmetrical Chest wall movement, Good air movement bilaterally, CTAB RRR,No Gallops,Rubs or new Murmurs, No Parasternal Heave +ve B.Sounds, Abd Soft, Non tender, No organomegaly appriciated, No rebound -guarding or rigidity. No Cyanosis, Clubbing or edema, No new Rash or bruise  Data Review   Major procedures and Radiology Reports - PLEASE review detailed and final reports for all details, in brief -  Echogram  - Left ventricle: The cavity size was mildly dilated. Wall thickness was normal. Systolic function was normal. The estimated ejection fraction was in the range of 55% to 60%. - Mitral valve: Mild regurgitation. - Left atrium: The atrium was mildly dilated.   Dg Chest 2 View (if Patient Has Fever And/or Copd)  09/19/2012   *RADIOLOGY REPORT*  Clinical Data: Short of breath.  Fever.  CHEST - 2 VIEW  Comparison: 07/12/2012.  Findings: Small focus of airspace disease is present in the anterior left lower lobe, evident in the retrocardiac region. The appearance is compatible with pneumonia.  Lower cervical ACDF. Cardiopericardial silhouette appears within normal limits.  Aortic arch atherosclerosis.  The right lung appears clear. Monitoring leads are projected over the chest. Follow-up to ensure radiographic clearing recommended.  Clearing is usually observed at 8 weeks.  IMPRESSION: Left lower lobe pneumonia.   Original Report Authenticated By: Andreas Newport, M.D.    Micro Results      Recent Results (from the past 240 hour(s))  CULTURE, BLOOD (ROUTINE X 2)     Status: None   Collection Time    09/19/12  7:47 PM      Result Value Range Status   Specimen Description BLOOD RIGHT ARM   Final   Special Requests BOTTLES DRAWN AEROBIC AND ANAEROBIC 10CC EA   Final   Culture  Setup Time     Final   Value: 09/20/2012 09:45     Performed at [facility]   Culture     Final   Value:        BLOOD CULTURE RECEIVED NO GROWTH TO DATE CULTURE WILL BE HELD FOR 5 DAYS  BEFORE ISSUING A FINAL NEGATIVE REPORT     Performed at [facility]   Report Status PENDING   Incomplete  CULTURE, BLOOD (ROUTINE X 2)     Status: None   Collection Time    09/19/12  7:59 PM      Result Value Range Status   Specimen Description BLOOD RIGHT HAND   Final   Special Requests BOTTLES DRAWN AEROBIC ONLY 10CC   Final   Culture  Setup Time     Final   Value: 09/20/2012 09:41     Performed at [facility]   Culture     Final   Value:        BLOOD CULTURE RECEIVED NO GROWTH TO DATE CULTURE WILL BE HELD FOR 5 DAYS BEFORE ISSUING A FINAL NEGATIVE REPORT     Performed at [facility]   Report Status PENDING   Incomplete  URINE CULTURE     Status: None   Collection Time    09/19/12 11:02 PM      Result Value Range Status   Specimen Description URINE, CLEAN CATCH   Final   Special Requests Normal   Final   Culture  Setup Time     Final   Value: 09/19/2012 23:49     Performed at [facility] Count     Final   Value: >=100,000 COLONIES/ML     Performed at [facility]   Culture     Final   Value: ESCHERICHIA COLI     Performed at [facility]   Report Status 09/21/2012 FINAL   Final   Organism ID, Bacteria ESCHERICHIA COLI   Final     CBC w Diff: Lab Results  Component Value Date   WBC 11.8*  09/22/2012   HGB 8.0* 09/22/2012   HCT 25.6* 09/22/2012   PLT 358 09/22/2012   LYMPHOPCT 15.0 07/26/2012   MONOPCT 8.0 07/26/2012   EOSPCT 1.7 07/26/2012   BASOPCT 0.6 07/26/2012    CMP: Lab Results  Component Value Date   NA 142 09/22/2012   K 3.7 09/22/2012   CL 99 09/22/2012   CO2 30 09/22/2012   BUN 30* 09/22/2012   CREATININE 1.55* 09/22/2012   PROT 6.8 07/12/2012   ALBUMIN 3.3* 07/12/2012   BILITOT 0.5 07/12/2012   ALKPHOS 77 07/12/2012   AST 11 07/12/2012   ALT 16 07/12/2012  .   Discharge Instructions     Follow with Primary MD GREEN, Lorenda Ishihara, MD in 5 days   Get CBC, CMP, checked 5 days by Primary MD and again as  instructed by your Primary MD. Get a 2 view Chest X ray done next visit.  Get Medicines reviewed and adjusted.  Please request your Prim.MD to go over all Hospital Tests and Procedure/Radiological results at the follow up, please get all Hospital records sent to your Prim MD by signing hospital release before you go home.  Activity: As tolerated with Full fall precautions use walker/cane & assistance as needed   Diet:  Heart Healthy, Aspiration precautions.  Check your Weight same time everyday, if you gain over 2 pounds, or you develop in leg swelling, experience more shortness of breath or chest pain, call your Primary MD immediately. Follow Cardiac Low Salt Diet and 1.8 lit/day fluid restriction.  Disposition Home   If you experience worsening of your admission symptoms, develop shortness of breath, life threatening emergency, suicidal or homicidal thoughts you must seek medical attention immediately by calling 911 or calling your MD immediately  if symptoms less severe.  You Must read complete instructions/literature along with all the possible adverse reactions/side effects for all the Medicines you take and that have been prescribed to you. Take any new Medicines after you have completely understood and accpet all the possible adverse reactions/side effects.   Do not drive and provide baby sitting services if your were admitted for syncope or siezures until you have seen by Primary MD or a Neurologist and advised to do so again.  Do not drive when taking Pain medications.    Do not take more than prescribed Pain, Sleep and Anxiety Medications  Special Instructions: If you have smoked or chewed Tobacco  in the last 2 yrs please stop smoking, stop any regular Alcohol  and or any Recreational drug use.  Wear Seat belts while driving.   Please note  You were cared for by a hospitalist during your hospital stay. If you have any questions about your discharge medications or the care  you received while you were in the hospital after you are discharged, you can call the unit and asked to speak with the hospitalist on call if the hospitalist that took care of you is not available. Once you are discharged, your primary care physician will handle any further medical issues. Please note that NO REFILLS for any discharge medications will be authorized once you are discharged, as it is imperative that you return to your primary care physician (or establish a relationship with a primary care physician if you do not have one) for your aftercare needs so that they can reassess your need for medications and monitor your lab values.         Follow-up Information   Follow up with GREEN, EDWIN  JAY, MD. Schedule an appointment as soon as possible for a visit in 5 days.   Specialty:  Internal Medicine   Contact information:   [address] [place] [address] [phone]         Discharge Medications     Medication List         acetaminophen 325 MG tablet  Commonly known as:  TYLENOL  Take 650 mg by mouth 2 (two) times daily as needed (temp above 102).     albuterol 108 (90 BASE) MCG/ACT inhaler  Commonly known as:  PROVENTIL HFA;VENTOLIN HFA  Inhale 2 puffs into the lungs every 6 (six) hours as needed for wheezing.     amLODipine 10 MG tablet  Commonly known as:  NORVASC  Take 10 mg by mouth daily.     benzonatate 100 MG capsule  Commonly known as:  TESSALON  Take 1 capsule (100 mg total) by mouth 3 (three) times daily as needed for cough.     calcium carbonate 600 MG Tabs tablet  Commonly known as:  OS-CAL  Take 600 mg by mouth 2 (two) times daily with a meal.     chlorzoxazone 500 MG tablet  Commonly known as:  PARAFON  Take 250 mg by mouth 3 (three) times daily as needed for muscle spasms.     flecainide 100 MG tablet  Commonly known as:  TAMBOCOR  Take 1 tablet (100 mg total) by mouth every 12 (twelve) hours.     Fluticasone-Salmeterol  250-50 MCG/DOSE Aepb  Commonly known as:  ADVAIR  Inhale 1 puff into the lungs every 12 (twelve) hours.     furosemide 20 MG tablet  Commonly known as:  LASIX  Take 2 tablets (40 mg total) by mouth daily.     GLUCOSAMINE CHONDR 1500 COMPLX PO  Take 1 tablet by mouth daily.     ICAPS PO  Take 1 tablet by mouth 2 (two) times daily.     levofloxacin 500 MG tablet  Commonly known as:  LEVAQUIN  Take 1 tablet (500 mg total) by mouth daily.     Nebivolol HCl 20 MG Tabs  Take 1 tablet (20 mg total) by mouth daily.     omeprazole 20 MG capsule  Commonly known as:  PRILOSEC  Take 20 mg by mouth daily.     potassium chloride 10 MEQ tablet  Commonly known as:  K-DUR  Take 2 tablets (20 mEq total) by mouth daily.     predniSONE 5 MG tablet  Commonly known as:  DELTASONE  Label  & dispense according to the schedule below.  8 Pills PO for 3 days, 6 Pills PO for 3 days, 4 Pills PO for 3 days, 2 Pills PO for 3 days, 1 Pills PO for 3 days, 1/2 Pill  PO for 3 days then STOP. Total 65 pills.     XARELTO 20 MG Tabs tablet  Generic drug:  Rivaroxaban  Take 20 mg by mouth daily.           Total Time in preparing paper work, data evaluation and todays exam - 35 minutes  Leroy Sea M.D on 09/22/2012 at 11:13 AM  [facility] Office  [phone]

## 2012-09-22 NOTE — Progress Notes (Signed)
Pt has received discharge instructions. RN went over instructions with the patient and answered all questions the patient had. Pt is being transported by her son to her home.

## 2012-09-23 NOTE — ED Provider Notes (Signed)
Medical screening examination/treatment/procedure(s) were conducted as a shared visit with non-physician practitioner(s) and myself.  I personally evaluated the patient during the encounter Pt c/o cough and worsening sob. Lower lobe rales on exam. Cxr. Labs. Admit.   Suzi Roots, MD 09/23/12 [phone]

## 2012-09-26 LAB — CULTURE, BLOOD (ROUTINE X 2): Culture: NO GROWTH

## 2012-10-11 ENCOUNTER — Telehealth: Payer: Self-pay | Admitting: Internal Medicine

## 2012-10-11 NOTE — Telephone Encounter (Signed)
Patient's grandson called stating patient Jordan Byrd not feeling well.  This has been off and off since recent discharge, but worse today.  Today with generalized fatigue and shortness of breath.  No fevers or chills.  Advised that patient be seen in the ED for evaluation.

## 2012-10-26 ENCOUNTER — Telehealth: Payer: Self-pay

## 2012-10-26 NOTE — Telephone Encounter (Signed)
**Note De-Identified  Obfuscation** Received a DOD call from Dr Chilton Si concerning pt having chest tightness and CHF. Per Dr Jens Som pt needs to be seen within the next couple of days. Both the pt and Dr Chilton Si is advised that the pt has has been scheduled an appointment with Dr Johney Frame on 9/25 at 2 pm. They both verbalized understanding.

## 2012-10-28 ENCOUNTER — Ambulatory Visit (INDEPENDENT_AMBULATORY_CARE_PROVIDER_SITE_OTHER): Payer: Medicare Other | Admitting: Internal Medicine

## 2012-10-28 ENCOUNTER — Encounter: Payer: Self-pay | Admitting: Internal Medicine

## 2012-10-28 VITALS — BP 140/72 | HR 68 | Ht 72.0 in | Wt 232.0 lb

## 2012-10-28 DIAGNOSIS — N183 Chronic kidney disease, stage 3 unspecified: Secondary | ICD-10-CM

## 2012-10-28 DIAGNOSIS — I5033 Acute on chronic diastolic (congestive) heart failure: Secondary | ICD-10-CM

## 2012-10-28 DIAGNOSIS — I480 Paroxysmal atrial fibrillation: Secondary | ICD-10-CM

## 2012-10-28 DIAGNOSIS — J449 Chronic obstructive pulmonary disease, unspecified: Secondary | ICD-10-CM | POA: Insufficient documentation

## 2012-10-28 DIAGNOSIS — R0789 Other chest pain: Secondary | ICD-10-CM

## 2012-10-28 DIAGNOSIS — I4891 Unspecified atrial fibrillation: Secondary | ICD-10-CM

## 2012-10-28 DIAGNOSIS — R0602 Shortness of breath: Secondary | ICD-10-CM

## 2012-10-28 DIAGNOSIS — I10 Essential (primary) hypertension: Secondary | ICD-10-CM

## 2012-10-28 MED ORDER — RIVAROXABAN 15 MG PO TABS: 15.0000 mg | ORAL_TABLET | Freq: Every day | ORAL | Status: DC

## 2012-10-28 NOTE — Progress Notes (Signed)
PCP:  Enrique Sack, MD  The patient presents today for routine electrophysiology followup.  She is sent today by Dr Chilton Si.  She has had difficulty recently with exertional SOB and chest tightness.  She has had progressive renal failure for which she has been sent to nephrology.  There has been discussion of holding lisinopril and increasing lasix.  The patient has not done this.   Today, she denies symptoms of palpitations, orthopnea, PND, lower extremity edema (above baseline), dizziness, presyncope, syncope, or neurologic sequela.   The patient feels that she is tolerating medications without difficulties and is otherwise without complaint today.   Past Medical History  Diagnosis Date  . Asthma   . Shortness of breath   . Arthritis   . Anxiety   . Hypertension   . Atrial fibrillation     a. 04/04/11  . Osteoarthritis     a. 03/28/11 - R Total Hip Arthroplasty  . Cataracts, both eyes   . Pneumonia   . Bronchitis   . Pleurisy   . Ankle swelling   . Kidney infection   . Frequent urination   . Excessive urination at night   . Chronic headaches   . Weight gain   . CHF (congestive heart failure)    Past Surgical History  Procedure Laterality Date  . Cardiac catheterization      1980's or 90's - reportedly normal  . Abdominal hysterectomy  1962  . Appendectomy  1954  . Cholecystectomy    . Back surgery  1990  . Cataract extraction bilateral w/ anterior vitrectomy  2007/2008  . Total hip arthroplasty  03/28/2011    Procedure: TOTAL HIP ARTHROPLASTY;  Surgeon: Thera Flake., MD;  Location: [facility] OR;  Service: Orthopedics;  Laterality: Right;  . Cardioversion  04/05/2011    Procedure: CARDIOVERSION;  Surgeon: Gardiner Rhyme, MD;  Location: [facility] OR;  Service: Cardiovascular;  Laterality: N/A;  . Tonsillectomy  1943  . Adenoidectomy  1949  . Foot surgery  1947  . Thoracic disc surgery  2008  . Lumbar disc surgery  2010  . Knee surgery       both knees  . Shoulder surgery      left   . Total hip arthroplasty  03/28/2011    right  . Cardioversion N/A 06/19/2012    Procedure: CARDIOVERSION-bedside(3W36);  Surgeon: Lewayne Bunting, MD;  Location: [facility] OR;  Service: Cardiovascular;  Laterality: N/A;    Current Outpatient Prescriptions  Medication Sig Dispense Refill  . acetaminophen (TYLENOL) 325 MG tablet Take 650 mg by mouth 2 (two) times daily as needed (temp above 102).      Marland Kitchen albuterol (PROVENTIL HFA;VENTOLIN HFA) 108 (90 BASE) MCG/ACT inhaler Inhale 2 puffs into the lungs every 6 (six) hours as needed for wheezing.      Marland Kitchen amLODipine (NORVASC) 10 MG tablet Take 10 mg by mouth daily.      . benzonatate (TESSALON) 100 MG capsule Take 1 capsule (100 mg total) by mouth 3 (three) times daily as needed for cough.  20 capsule  0  . chlorzoxazone (PARAFON) 500 MG tablet Take 250 mg by mouth 3 (three) times daily as needed for muscle spasms.      . flecainide (TAMBOCOR) 100 MG tablet Take 1 tablet (100 mg total) by mouth every 12 (twelve) hours.  180 tablet  3  . Fluticasone-Salmeterol (ADVAIR) 250-50 MCG/DOSE AEPB Inhale 1 puff into the lungs every 12 (twelve) hours.      Marland Kitchen  furosemide (LASIX) 20 MG tablet Take 2 tablets (40 mg total) by mouth daily.  30 tablet  3  . Glucosamine-Chondroit-Vit C-Mn (GLUCOSAMINE CHONDR 1500 COMPLX PO) Take 1 tablet by mouth daily.      Marland Kitchen levofloxacin (LEVAQUIN) 500 MG tablet Take 1 tablet (500 mg total) by mouth daily.  5 tablet  0  . Multiple Vitamins-Minerals (ICAPS PO) Take 1 tablet by mouth 2 (two) times daily.      . Nebivolol HCl 20 MG TABS Take 1 tablet (20 mg total) by mouth daily.  90 tablet  3  . omeprazole (PRILOSEC) 20 MG capsule Take 20 mg by mouth daily.      . potassium chloride (K-DUR) 10 MEQ tablet Take 2 tablets (20 mEq total) by mouth daily.  30 tablet  0  . predniSONE (DELTASONE) 5 MG tablet Label  & dispense according to the schedule below.  8 Pills PO for 3 days, 6 Pills PO for 3 days, 4 Pills PO for 3 days, 2 Pills PO for 3  days, 1 Pills PO for 3 days, 1/2 Pill  PO for 3 days then STOP. Total 65 pills.  65 tablet  0  . Rivaroxaban (XARELTO) 20 MG TABS tablet Take 20 mg by mouth daily.       No current facility-administered medications for this visit.    Allergies  Allergen Reactions  . Aspirin Nausea And Vomiting  . Aspirin Buf(Alhyd-Mghyd-Cacar) Nausea And Vomiting  . Butazolidin [Phenylbutazone] Other (See Comments)    unknown  . Celebrex [Celecoxib] Other (See Comments)    unknown  . Codeine Nausea And Vomiting  . Doripenem Other (See Comments)    unknown  . Methocarbamol Hives  . Temazepam Other (See Comments)    unknown  . Vicodin [Hydrocodone-Acetaminophen]     Interacts with bp medication and drops blood pressure    History   Social History  . Marital Status: Widowed    Spouse Name: N/A    Number of Children: N/A  . Years of Education: N/A   Occupational History  . Retired    Social History Main Topics  . Smoking status: Former Smoker -- 21 years    Types: Cigarettes    Quit date: 03/17/1970  . Smokeless tobacco: Former Neurosurgeon  . Alcohol Use: No  . Drug Use: No  . Sexual Activity: Not Currently   Other Topics Concern  . Not on file   Social History Narrative   Lives alone in [place].  Widowed in 2012 (husband w/ dementia & parkinsons)..   Not active, does not drive due to vision problems.    Family History  Problem Relation Age of Onset  . Diabetes Father     Father, Mother, 4 sisters (2 living)  . Heart attack Father     (Deceased)  . Heart failure Father     (deceased 46)  . Stroke Mother     (deceased 61)  . Cancer Sister     (deceased)  . Hypertension Father   . Hypertension Mother   . Heart attack Sister   . Diabetes Sister    Physical Exam: Filed Vitals:   10/28/12 1433  BP: 140/72  Pulse: 68  Height: 6' (1.829 m)  Weight: 232 lb (105.235 kg)    GEN- The patient is overweight and chronically ill appearing, alert and oriented x 3 today.   Head-  normocephalic, atraumatic Eyes-  Sclera clear, conjunctiva pink Ears- hearing intact Oropharynx- clear Neck- supple,  JVP 10cm  Lungs- Clear to ausculation bilaterally, normal work of breathing Heart- Regular rate and rhythm, no murmurs, rubs or gallops, PMI not laterally displaced GI- soft, NT, ND, + BS Extremities- no clubbing, cyanosis, +1edema MS- no significant deformity or atrophy Skin- no rash or lesion Psych- euthymic mood, full affect Neuro- strength and sensation are intact  ekg today reveals sinus rhythm 68 bpm, PR 210, incomplete LBBB Labs from Dr Chilton Si are reviewed  Assessment and Plan:  1. afib Controlled CrCl was previously less than 50 and I had reduced xarelto to 15mg  daily.  It appears that she is now taking 20mg  daily.  Her CrCl is 47.  I will again encourage her to decrease to 15mg  daily  2. Chronic diastolic dysfunction.  Continue current lasix.  Therapy is limited by renal failure which I believe is the bigger issue.  3. CRI Stable Continue to follow with nephrology  4. HTN Stable No change required today  5. Exertional SOB and chest tightness Concerning for angina, though could also be due to her chronic lung disease Will obtain a lexiscan myoview to evaluate for ischemia  Return to see Lawson Fiscal as scheduled

## 2012-10-28 NOTE — Patient Instructions (Addendum)
Your physician recommends that you schedule a follow-up appointment as scheduled with Sunday Spillers  Your physician has requested that you have a lexiscan myoview. For further information please visit [email or web address] Please follow instruction sheet, as given.   Your physician has recommended you make the following change in your medication:  1) Decrease Xarelto to 15mg  daily

## 2012-11-09 ENCOUNTER — Other Ambulatory Visit: Payer: Self-pay

## 2012-11-09 MED ORDER — FLECAINIDE ACETATE 100 MG PO TABS: 100.0000 mg | ORAL_TABLET | Freq: Two times a day (BID) | ORAL | Status: DC

## 2012-11-10 ENCOUNTER — Encounter (HOSPITAL_COMMUNITY): Payer: Medicare Other

## 2012-11-11 ENCOUNTER — Telehealth: Payer: Self-pay | Admitting: Nurse Practitioner

## 2012-11-11 ENCOUNTER — Ambulatory Visit (HOSPITAL_COMMUNITY): Payer: Medicare Other | Attending: Cardiovascular Disease | Admitting: Radiology

## 2012-11-11 ENCOUNTER — Other Ambulatory Visit: Payer: Self-pay

## 2012-11-11 ENCOUNTER — Telehealth: Payer: Self-pay

## 2012-11-11 VITALS — BP 106/78 | Ht 72.0 in | Wt 234.0 lb

## 2012-11-11 DIAGNOSIS — I4891 Unspecified atrial fibrillation: Secondary | ICD-10-CM | POA: Insufficient documentation

## 2012-11-11 DIAGNOSIS — Z87891 Personal history of nicotine dependence: Secondary | ICD-10-CM | POA: Insufficient documentation

## 2012-11-11 DIAGNOSIS — R0989 Other specified symptoms and signs involving the circulatory and respiratory systems: Secondary | ICD-10-CM | POA: Insufficient documentation

## 2012-11-11 DIAGNOSIS — I509 Heart failure, unspecified: Secondary | ICD-10-CM | POA: Insufficient documentation

## 2012-11-11 DIAGNOSIS — R0602 Shortness of breath: Secondary | ICD-10-CM | POA: Insufficient documentation

## 2012-11-11 DIAGNOSIS — J45909 Unspecified asthma, uncomplicated: Secondary | ICD-10-CM | POA: Insufficient documentation

## 2012-11-11 DIAGNOSIS — Z8249 Family history of ischemic heart disease and other diseases of the circulatory system: Secondary | ICD-10-CM | POA: Insufficient documentation

## 2012-11-11 DIAGNOSIS — N183 Chronic kidney disease, stage 3 unspecified: Secondary | ICD-10-CM | POA: Insufficient documentation

## 2012-11-11 DIAGNOSIS — R0789 Other chest pain: Secondary | ICD-10-CM

## 2012-11-11 DIAGNOSIS — R0609 Other forms of dyspnea: Secondary | ICD-10-CM | POA: Insufficient documentation

## 2012-11-11 DIAGNOSIS — I129 Hypertensive chronic kidney disease with stage 1 through stage 4 chronic kidney disease, or unspecified chronic kidney disease: Secondary | ICD-10-CM | POA: Insufficient documentation

## 2012-11-11 MED ORDER — REGADENOSON 0.4 MG/5ML IV SOLN
0.4000 mg | Freq: Once | INTRAVENOUS | Status: AC
  Administered 2012-11-11: 0.4 mg via INTRAVENOUS

## 2012-11-11 MED ORDER — FLECAINIDE ACETATE 100 MG PO TABS: 100.0000 mg | ORAL_TABLET | Freq: Two times a day (BID) | ORAL | Status: DC

## 2012-11-11 MED ORDER — TECHNETIUM TC 99M SESTAMIBI GENERIC - CARDIOLITE
33.0000 | Freq: Once | INTRAVENOUS | Status: AC | PRN
  Administered 2012-11-11: 33 via INTRAVENOUS

## 2012-11-11 NOTE — Progress Notes (Signed)
[facility] SITE 3 NUCLEAR MED [address] [place], [place] [address] [phone]    Cardiology Nuclear Med Study  Jordan Byrd is a 79 y.o. female     MRN : [id number]     DOB: February 07, 1933  Procedure Date: 11/11/2012  Nuclear Med Background Indication for Stress Test:  Evaluation for Ischemia History:  Asthma and PAF, CHF, 80s or 90s:Heart Cath:NL, 1/3 1/4: Cardioversion, 6/14 ECHO: EF: 55-60%,MPS: in HP NL, CKD Stage III Cardiac Risk Factors: Family History - CAD, History of Smoking and Hypertension  Symptoms:  Chest Tightness (last date of chest discomfort 11/04/2012), DOE and SOB   Nuclear Pre-Procedure Caffeine/Decaff Intake:  None NPO After: 4:30 pm   Lungs:  clear O2 Sat: 95% on room air. IV 0.9% NS with Angio Cath:  22g  IV Site: R Antecubital  IV Started by:  Milana Na, EMT-P  Chest Size (in):  46 Cup Size: DDD  Height: 6' (1.829 m)  Weight:  234 lb (106.142 kg)  BMI:  Body mass index is 31.73 kg/(m^2). Tech Comments:  Rx with water, no Lasix    Nuclear Med Study 1 or 2 day study: 2 day  Stress Test Type:  Lexiscan  Reading MD: Tinnie Gens Joei Frangos,M.D. Order Authorizing Provider:  J.Allred MD  Resting Radionuclide: Technetium 2m Sestamibi  Resting Radionuclide Dose: 33.0 mCi  On       11-15-12   Stress Radionuclide:  Technetium 58m Sestamibi  Stress Radionuclide Dose: 33.0 mCi   On         11-11-12          Stress Protocol Rest HR: 56 Stress HR: 71  Rest BP: 106/78 Stress BP: 127/93  Exercise Time (min): n/a METS: n/a   Predicted Max HR: 143 bpm % Max HR: 49.65 bpm Rate Pressure Product: 9017   Dose of Adenosine (mg):  n/a Dose of Lexiscan: 0.4 mg  Dose of Atropine (mg): n/a Dose of Dobutamine: n/a mcg/kg/min (at max HR)  Stress Test Technologist: Milana Na, EMT-P  Nuclear Technologist:  Domenic Polite, CNMT     Rest Procedure:  Myocardial perfusion imaging was performed at rest 45 minutes following the intravenous administration of  Technetium 13m Sestamibi. Rest ECG: Mild sinus bradycardia with normal QRS  Stress Procedure:  The patient received IV Lexiscan 0.4 mg over 15-seconds.  Technetium 62m Sestamibi injected at 30-seconds. This patient had sob with the Lexiscan injection. Quantitative spect images were obtained after a 45 minute delay. Stress ECG: No significant change from baseline ECG  QPS Raw Data Images:  There is significant breast shadowing Stress Images:  Medium-sized area with moderate decreased activity in the mid anterior segment and apical anterior segment. This area is fixed. Rest Images:  Medium-sized area with moderate decreased activity in the mid anterior segment and apical anterior segment Subtraction (SDS):  No evidence of ischemia. Transient Ischemic Dilatation (Normal <1.22):NA Lung/Heart Ratio (Normal <0.45):  0.44  Quantitative Gated Spect Images QGS EDV:  134 ml QGS ESV:  55 ml  Impression Exercise Capacity:  Lexiscan with no exercise. BP Response:  Normal blood pressure response. Clinical Symptoms:  Shortness of breath ECG Impression:  No significant ST segment change suggestive of ischemia. Comparison with Prior Nuclear Study: No images to compare  Overall Impression:  This is a low risk scan. There is a moderate size area of decreased activity in the midanterior wall to the apex. This is fixed. Most likely this is artifact related to breast attenuation.  There is no definite ischemia.  LV Ejection Fraction: 59%.  LV Wall Motion:  Normal Wall Motion  Willa Rough, MD

## 2012-11-11 NOTE — Telephone Encounter (Signed)
son called about refill on patients flecainide she needed a refill until her mail order came in.I called pharm to see if they could fill her a 30 day and the price

## 2012-11-11 NOTE — Telephone Encounter (Signed)
Patient gets her Flecanide by mail but will not receive it for another 10 days.  She wants to know if she can have about 10 days worth called in to [facility]. Please call and let her Lucila Maine know if he can pick up the med.

## 2012-11-12 NOTE — Telephone Encounter (Signed)
This is already done.

## 2012-11-15 ENCOUNTER — Ambulatory Visit (HOSPITAL_COMMUNITY): Payer: Medicare Other | Attending: Cardiology

## 2012-11-15 DIAGNOSIS — R0989 Other specified symptoms and signs involving the circulatory and respiratory systems: Secondary | ICD-10-CM

## 2012-11-15 MED ORDER — TECHNETIUM TC 99M SESTAMIBI GENERIC - CARDIOLITE
33.0000 | Freq: Once | INTRAVENOUS | Status: AC | PRN
  Administered 2012-11-15: 33 via INTRAVENOUS

## 2012-11-17 ENCOUNTER — Ambulatory Visit (INDEPENDENT_AMBULATORY_CARE_PROVIDER_SITE_OTHER): Payer: Medicare Other | Admitting: Nurse Practitioner

## 2012-11-17 ENCOUNTER — Encounter: Payer: Self-pay | Admitting: Nurse Practitioner

## 2012-11-17 VITALS — BP 120/60 | HR 60 | Ht 66.0 in | Wt 232.1 lb

## 2012-11-17 DIAGNOSIS — I4891 Unspecified atrial fibrillation: Secondary | ICD-10-CM

## 2012-11-17 DIAGNOSIS — I5032 Chronic diastolic (congestive) heart failure: Secondary | ICD-10-CM

## 2012-11-17 LAB — BASIC METABOLIC PANEL
BUN: 30 mg/dL — ABNORMAL HIGH (ref 6–23)
CO2: 29 mEq/L (ref 19–32)
Calcium: 9.2 mg/dL (ref 8.4–10.5)
Chloride: 100 mEq/L (ref 96–112)
Creatinine, Ser: 1.4 mg/dL — ABNORMAL HIGH (ref 0.4–1.2)
GFR: 38.72 mL/min — ABNORMAL LOW (ref >60.00)
Glucose, Bld: 108 mg/dL — ABNORMAL HIGH (ref 70–99)
Potassium: 3.7 mEq/L (ref 3.5–5.1)
Sodium: 140 mEq/L (ref 135–145)

## 2012-11-17 LAB — CBC WITH DIFFERENTIAL/PLATELET
Basophils Absolute: 0 10*3/uL (ref 0.0–0.1)
Basophils Relative: 0.7 % (ref 0.0–3.0)
Eosinophils Absolute: 0.3 10*3/uL (ref 0.0–0.7)
Eosinophils Relative: 4.3 % (ref 0.0–5.0)
HCT: 29 % — ABNORMAL LOW (ref 36.0–46.0)
Hemoglobin: 9.4 g/dL — ABNORMAL LOW (ref 12.0–15.0)
Lymphocytes Relative: 16.4 % (ref 12.0–46.0)
Lymphs Abs: 1.1 10*3/uL (ref 0.7–4.0)
MCHC: 32.5 g/dL (ref 30.0–36.0)
MCV: 76 fl — ABNORMAL LOW (ref 78.0–100.0)
Monocytes Absolute: 0.7 10*3/uL (ref 0.1–1.0)
Monocytes Relative: 9.7 % (ref 3.0–12.0)
Neutro Abs: 4.6 10*3/uL (ref 1.4–7.7)
Neutrophils Relative %: 68.9 % (ref 43.0–77.0)
Platelets: 342 10*3/uL (ref 150.0–400.0)
RBC: 3.81 Mil/uL — ABNORMAL LOW (ref 3.87–5.11)
RDW: 17 % — ABNORMAL HIGH (ref 11.5–14.6)
WBC: 6.7 10*3/uL (ref 4.5–10.5)

## 2012-11-17 NOTE — Patient Instructions (Signed)
We will check labs today  I will send Dr. Chilton Si a note  Stay on your current medicines  See Dr. Johney Frame in February as planned  Call the [facility] office at [phone] if you have any questions, problems or concerns.

## 2012-11-17 NOTE — Progress Notes (Signed)
Jordan Byrd Date of Birth: 21-Nov-1933 Medical Record #[id number]  History of Present Illness: Jordan Byrd is seen back today for a 3 week visit. Seen for Dr. Johney Frame. She has PAF, HTN, CKD and diastolic heart failure. No know CAD. Was admitted towards the end of May of 2014 with asthma exacerbation. Has had prior cardioversions for her atrial fib and in the past had been "highly symptomatic". Remains on flecainide. Now on Xarelto for anticoagulation.   I saw her in early June. She was doing ok. Was back in atrial fib and did NOT seem to be aware nor symptomatic.   Admitted 6/9 to 6/13 with acute on chronic diastolic HF. Echo was updated. Normal EF. She was diuresed and had worsening renal function. Was in sinus rhythm during that admission. Did have worsening renal function - referred to nephrology. Now off of her ACE and on lower dose of diuretic. To see nephrology in the fall based on her labs.   Seen by Dr. Johney Frame at the end of September - for exertional SOB and chest tightness - apparently she had not stopped her ACE nor changed her dose of Lasix. Xarelto was cut back due to her CKD. Her HF was felt to be limited by her renal function. Lexiscan was ordered - this turned out to be low risk. She was in sinus rhythm at her last visit in September.  Comes back today. Here with her grandson. Says she is feeling good. No more chest pain. Has seen Dr. Hyman Hopes - says she is not going back - has no intentions to get IV iron or stop her calcium per his recommendations. Apparently has had blood in her stool - she is quite vague on this issue - says she is never having another colonoscopy. Not currently too short of breath. No swelling and no more chest pain.   Current Outpatient Prescriptions  Medication Sig Dispense Refill  . acetaminophen (TYLENOL) 325 MG tablet Take 650 mg by mouth 2 (two) times daily as needed (temp above 102).      Marland Kitchen albuterol (PROVENTIL HFA;VENTOLIN HFA) 108 (90 BASE) MCG/ACT  inhaler Inhale 2 puffs into the lungs every 6 (six) hours as needed for wheezing.      Marland Kitchen amLODipine (NORVASC) 10 MG tablet Take 10 mg by mouth daily.      . chlorzoxazone (PARAFON) 500 MG tablet Take 250 mg by mouth 3 (three) times daily as needed for muscle spasms.      . flecainide (TAMBOCOR) 100 MG tablet Take 1 tablet (100 mg total) by mouth every 12 (twelve) hours.  180 tablet  3  . furosemide (LASIX) 20 MG tablet Take 2 tablets (40 mg total) by mouth daily.  30 tablet  3  . Glucosamine-Chondroit-Vit C-Mn (GLUCOSAMINE CHONDR 1500 COMPLX PO) Take 1 tablet by mouth daily.      . Multiple Vitamins-Minerals (ICAPS PO) Take 1 tablet by mouth 2 (two) times daily.      . Nebivolol HCl 20 MG TABS Take 1 tablet (20 mg total) by mouth daily.  90 tablet  3  . omeprazole (PRILOSEC) 20 MG capsule Take 20 mg by mouth daily.      . Rivaroxaban (XARELTO) 15 MG TABS tablet Take 1 tablet (15 mg total) by mouth daily.  90 tablet  3   No current facility-administered medications for this visit.    Allergies  Allergen Reactions  . Aspirin Nausea And Vomiting  . Aspirin Buf(Alhyd-Mghyd-Cacar) Nausea And Vomiting  .  Butazolidin [Phenylbutazone] Other (See Comments)    unknown  . Celebrex [Celecoxib] Other (See Comments)    unknown  . Codeine Nausea And Vomiting  . Doripenem Other (See Comments)    unknown  . Methocarbamol Hives  . Temazepam Other (See Comments)    unknown  . Vicodin [Hydrocodone-Acetaminophen]     Interacts with bp medication and drops blood pressure    Past Medical History  Diagnosis Date  . Asthma   . Shortness of breath   . Arthritis   . Anxiety   . Hypertension   . Atrial fibrillation     a. 04/04/11  . Osteoarthritis     a. 03/28/11 - R Total Hip Arthroplasty  . Cataracts, both eyes   . Pneumonia   . Bronchitis   . Pleurisy   . Ankle swelling   . Kidney infection   . Frequent urination   . Excessive urination at night   . Chronic headaches   . Weight gain   . CHF  (congestive heart failure)     Past Surgical History  Procedure Laterality Date  . Cardiac catheterization      1980's or 90's - reportedly normal  . Abdominal hysterectomy  1962  . Appendectomy  1954  . Cholecystectomy    . Back surgery  1990  . Cataract extraction bilateral w/ anterior vitrectomy  2007/2008  . Total hip arthroplasty  03/28/2011    Procedure: TOTAL HIP ARTHROPLASTY;  Surgeon: Thera Flake., MD;  Location: [facility] OR;  Service: Orthopedics;  Laterality: Right;  . Cardioversion  04/05/2011    Procedure: CARDIOVERSION;  Surgeon: Gardiner Rhyme, MD;  Location: [facility] OR;  Service: Cardiovascular;  Laterality: N/A;  . Tonsillectomy  1943  . Adenoidectomy  1949  . Foot surgery  1947  . Thoracic disc surgery  2008  . Lumbar disc surgery  2010  . Knee surgery       both knees  . Shoulder surgery      left  . Total hip arthroplasty  03/28/2011    right  . Cardioversion N/A 06/19/2012    Procedure: CARDIOVERSION-bedside(3W36);  Surgeon: Lewayne Bunting, MD;  Location: [facility] OR;  Service: Cardiovascular;  Laterality: N/A;    History  Smoking status  . Former Smoker -- 21 years  . Types: Cigarettes  . Quit date: 03/17/1970  Smokeless tobacco  . Former User    History  Alcohol Use No    Family History  Problem Relation Age of Onset  . Diabetes Father     Father, Mother, 4 sisters (2 living)  . Heart attack Father     (Deceased)  . Heart failure Father     (deceased 67)  . Stroke Mother     (deceased 41)  . Cancer Sister     (deceased)  . Hypertension Father   . Hypertension Mother   . Heart attack Sister   . Diabetes Sister     Review of Systems: The review of systems is per the HPI.  All other systems were reviewed and are negative.  Physical Exam: BP 120/60  Pulse 60  Ht 5\' 6"  (1.676 m)  Wt 232 lb 1.9 oz (105.289 kg)  BMI 37.48 kg/m2 Patient is very pleasant and in no acute distress. Using a walker. Obese. Skin is warm and dry. Color is normal.  HEENT  is unremarkable. Normocephalic/atraumatic. PERRL. Sclera are nonicteric. Neck is supple. No masses. No JVD. Lungs are clear. Cardiac exam shows a  regular rate and rhythm.   Abdomen is soft. Extremities are without edema. Gait and ROM are intact. No gross neurologic deficits noted.  LABORATORY DATA: PENDING  Lab Results  Component Value Date   WBC 11.8* 09/22/2012   HGB 8.0* 09/22/2012   HCT 25.6* 09/22/2012   PLT 358 09/22/2012   GLUCOSE 88 09/22/2012   CHOL 204* 04/05/2011   TRIG 211* 04/05/2011   HDL 26* 04/05/2011   LDLCALC 136* 04/05/2011   ALT 16 07/12/2012   AST 11 07/12/2012   NA 142 09/22/2012   K 3.7 09/22/2012   CL 99 09/22/2012   CREATININE 1.55* 09/22/2012   BUN 30* 09/22/2012   CO2 30 09/22/2012   TSH 1.307 09/19/2012   INR 1.07 04/04/2011   Myoview Impression  Exercise Capacity: Lexiscan with no exercise.  BP Response: Normal blood pressure response.  Clinical Symptoms: Shortness of breath  ECG Impression: No significant ST segment change suggestive of ischemia.  Comparison with Prior Nuclear Study: No images to compare  Overall Impression: This is a low risk scan. There is a moderate size area of decreased activity in the midanterior wall to the apex. This is fixed. Most likely this is artifact related to breast attenuation. There is no definite ischemia.  LV Ejection Fraction: 59%. LV Wall Motion: Normal Wall Motion  Willa Rough, MD    Assessment / Plan: 1. Paroxysmal atrial fib - on Xarelto - remains on Flecainide as well - in sinus on physical exam today. I have left her on her current regimen.   2. Chronic diastolic HF - seems compensated at this time.   3. Chest pain - no known CAD - low risk Myoview - results given to her today.   4. Progressive CKD - hinders her HF therapy - she says she is not going back to see Dr. Hyman Hopes and does not want to follow his recommendations. I have encouraged her to keep seeing him.   5. ?blood in stool - she is not interested in any kind of  evaluation - she remains on Xarelto and has been anemic - I have tried to explain to her that this will need to be worked up. We may have to stop her Xarelto and her stroke risk will be elevated.   Not really sure we have much left to offer her with her resistance to follow our recommendations. Will defer management to PCP.   Patient is agreeable to this plan and will call if any problems develop in the interim.   Rosalio Macadamia, RN, ANP-C [facility] [address] [place], [place]  [address]

## 2012-11-18 ENCOUNTER — Telehealth: Payer: Self-pay | Admitting: Internal Medicine

## 2012-11-18 NOTE — Telephone Encounter (Signed)
New problem:  Pt states she is calling for her recent lab results. Pt states she has already gotten her stress results. Pt states she just needs her blood work results.

## 2012-12-07 ENCOUNTER — Telehealth: Payer: Self-pay

## 2012-12-07 NOTE — Telephone Encounter (Signed)
patient grandson called wanting samples of xarelto 15 mg , samples placed up front

## 2012-12-16 ENCOUNTER — Other Ambulatory Visit: Payer: Self-pay | Admitting: Gastroenterology

## 2012-12-16 DIAGNOSIS — D509 Iron deficiency anemia, unspecified: Secondary | ICD-10-CM

## 2012-12-24 ENCOUNTER — Telehealth: Payer: Self-pay | Admitting: Internal Medicine

## 2012-12-24 NOTE — Telephone Encounter (Signed)
Received request from Nurse fax box, documents faxed for surgical clearance. To: Jarold Song Fax number: [phone] Attention: 12/24/12/KM

## 2013-01-03 ENCOUNTER — Other Ambulatory Visit: Payer: Medicare Other

## 2013-02-09 ENCOUNTER — Other Ambulatory Visit: Payer: Self-pay

## 2013-02-09 DIAGNOSIS — Z1231 Encounter for screening mammogram for malignant neoplasm of breast: Secondary | ICD-10-CM

## 2013-02-10 ENCOUNTER — Telehealth: Payer: Self-pay | Admitting: Internal Medicine

## 2013-02-10 NOTE — Telephone Encounter (Signed)
Spoke with grandson and the medication is too expensive.  I am going to pit samples out front for the Xarelto 15mg  and that will get her through to her appointment. They can discuss with Dr Rayann Heman at her appointment

## 2013-02-10 NOTE — Telephone Encounter (Signed)
New message     Cant afford bystolic and zaralto.  Is there somethine else she can take that is cheaper?

## 2013-02-28 ENCOUNTER — Telehealth: Payer: Self-pay | Admitting: *Deleted

## 2013-02-28 NOTE — Telephone Encounter (Signed)
Patients grandson came into office for xarelto samples. Samples provided.

## 2013-03-07 ENCOUNTER — Encounter: Payer: Self-pay | Admitting: Internal Medicine

## 2013-03-07 ENCOUNTER — Ambulatory Visit (INDEPENDENT_AMBULATORY_CARE_PROVIDER_SITE_OTHER): Payer: Medicare Other | Admitting: Internal Medicine

## 2013-03-07 VITALS — BP 132/78 | HR 55 | Ht 66.0 in | Wt 235.7 lb

## 2013-03-07 DIAGNOSIS — R197 Diarrhea, unspecified: Secondary | ICD-10-CM

## 2013-03-07 DIAGNOSIS — I10 Essential (primary) hypertension: Secondary | ICD-10-CM

## 2013-03-07 DIAGNOSIS — Z7901 Long term (current) use of anticoagulants: Secondary | ICD-10-CM

## 2013-03-07 DIAGNOSIS — I5030 Unspecified diastolic (congestive) heart failure: Secondary | ICD-10-CM

## 2013-03-07 DIAGNOSIS — N183 Chronic kidney disease, stage 3 unspecified: Secondary | ICD-10-CM

## 2013-03-07 DIAGNOSIS — I480 Paroxysmal atrial fibrillation: Secondary | ICD-10-CM

## 2013-03-07 DIAGNOSIS — D649 Anemia, unspecified: Secondary | ICD-10-CM

## 2013-03-07 DIAGNOSIS — R0602 Shortness of breath: Secondary | ICD-10-CM

## 2013-03-07 DIAGNOSIS — I4891 Unspecified atrial fibrillation: Secondary | ICD-10-CM

## 2013-03-07 NOTE — Progress Notes (Signed)
PCP:  Jordan Peaches, MD  The patient presents today for routine electrophysiology followup.  She is doing very well presently.  Her SOB is much improved and fatigue is resolved.  She has been working with GI on GI bleeding and had colonoscopy revealing polyps as the source.  She has been placed on iron and has follow-up with Dr [name] later this week.  Her primary concern today is with L shoulder pain.  This is clearly musculoskeletal in nature. Today, she denies symptoms of palpitations, orthopnea, PND, lower extremity edema (above baseline), dizziness, presyncope, syncope, or neurologic sequela.   The patient feels that she is tolerating medications without difficulties and is otherwise without complaint today.   Past Medical History  Diagnosis Date  . Asthma   . Shortness of breath   . Arthritis   . Anxiety   . Hypertension   . Atrial fibrillation     a. 04/04/11  . Osteoarthritis     a. 03/28/11 - R Total Hip Arthroplasty  . Cataracts, both eyes   . Pneumonia   . Bronchitis   . Pleurisy   . Ankle swelling   . Kidney infection   . Frequent urination   . Excessive urination at night   . Chronic headaches   . Weight gain   . CHF (congestive heart failure)    Past Surgical History  Procedure Laterality Date  . Cardiac catheterization      1980's or 90's - reportedly normal  . Abdominal hysterectomy  1962  . Appendectomy  1954  . Cholecystectomy    . Back surgery  1990  . Cataract extraction bilateral w/ anterior vitrectomy  2007/2008  . Total hip arthroplasty  03/28/2011    Procedure: TOTAL HIP ARTHROPLASTY;  Surgeon: Yvette Rack., MD;  Location: [facility];  Service: Orthopedics;  Laterality: Right;  . Cardioversion  04/05/2011    Procedure: CARDIOVERSION;  Surgeon: Coralyn Mark, MD;  Location: [facility];  Service: Cardiovascular;  Laterality: N/A;  . Tonsillectomy  1943  . Adenoidectomy  1949  . Foot surgery  1947  . Thoracic disc surgery  2008  . Lumbar disc surgery  2010  .  Knee surgery       both knees  . Shoulder surgery      left  . Total hip arthroplasty  03/28/2011    right  . Cardioversion N/A 06/19/2012    Procedure: CARDIOVERSION-bedside(3W36);  Surgeon: Lelon Perla, MD;  Location: [facility] OR;  Service: Cardiovascular;  Laterality: N/A;    Current Outpatient Prescriptions  Medication Sig Dispense Refill  . acetaminophen (TYLENOL) 325 MG tablet Take 650 mg by mouth 2 (two) times daily as needed (temp above 102).      Marland Kitchen albuterol (PROVENTIL HFA;VENTOLIN HFA) 108 (90 BASE) MCG/ACT inhaler Inhale 2 puffs into the lungs every 6 (six) hours as needed for wheezing.      Marland Kitchen amLODipine (NORVASC) 10 MG tablet Take 10 mg by mouth daily.      . chlorzoxazone (PARAFON) 500 MG tablet Take 250 mg by mouth 3 (three) times daily as needed for muscle spasms.      . flecainide (TAMBOCOR) 100 MG tablet Take 1 tablet (100 mg total) by mouth every 12 (twelve) hours.  180 tablet  3  . furosemide (LASIX) 20 MG tablet Take 2 tablets (40 mg total) by mouth daily.  30 tablet  3  . Glucosamine-Chondroit-Vit C-Mn (GLUCOSAMINE CHONDR 1500 COMPLX PO) Take 1 tablet by mouth  daily.      . IRON PO Liquid Iron - Take 5 mg twice a day      . Multiple Vitamins-Minerals (ICAPS PO) Take 1 tablet by mouth 2 (two) times daily.      . Nebivolol HCl 20 MG TABS Take 1 tablet (20 mg total) by mouth daily.  90 tablet  3  . omeprazole (PRILOSEC) 20 MG capsule Take 20 mg by mouth daily.      . Rivaroxaban (XARELTO) 15 MG TABS tablet Take 1 tablet (15 mg total) by mouth daily.  90 tablet  3   No current facility-administered medications for this visit.    Allergies  Allergen Reactions  . Aspirin Nausea And Vomiting  . Aspirin Buf(Alhyd-Mghyd-Cacar) Nausea And Vomiting  . Butazolidin [Phenylbutazone] Other (See Comments)    unknown  . Celebrex [Celecoxib] Other (See Comments)    unknown  . Codeine Nausea And Vomiting  . Doripenem Other (See Comments)    unknown  . Methocarbamol Hives  .  Temazepam Other (See Comments)    unknown  . Vicodin [Hydrocodone-Acetaminophen]     Interacts with bp medication and drops blood pressure    History   Social History  . Marital Status: Widowed    Spouse Name: N/A    Number of Children: N/A  . Years of Education: N/A   Occupational History  . Retired    Social History Main Topics  . Smoking status: Former Smoker -- 21 years    Types: Cigarettes    Quit date: 03/17/1970  . Smokeless tobacco: Former Systems developer  . Alcohol Use: No  . Drug Use: No  . Sexual Activity: Not Currently   Other Topics Concern  . Not on file   Social History Narrative   Lives alone in [place].  Widowed in 2012 (husband w/ dementia & parkinsons)..   Not active, does not drive due to vision problems.    Family History  Problem Relation Age of Onset  . Diabetes Father     Father, Mother, 4 sisters (2 living)  . Heart attack Father     (Deceased)  . Heart failure Father     (deceased 82)  . Stroke Mother     (deceased 46)  . Cancer Sister     (deceased)  . Hypertension Father   . Hypertension Mother   . Heart attack Sister   . Diabetes Sister    Physical Exam: Filed Vitals:   03/07/13 1011  BP: 132/78  Pulse: 55  Height: 5\' 6"  (1.676 m)  Weight: 235 lb 10.4 oz (106.89 kg)    GEN- The patient is overweight and chronically ill appearing, alert and oriented x 3 today.   Head- normocephalic, atraumatic Eyes-  Sclera clear, conjunctiva pink Ears- hearing intact Oropharynx- clear Neck- supple,  JVP 6cm Lungs- Clear to ausculation bilaterally, normal work of breathing Heart- Regular rate and rhythm, no murmurs, rubs or gallops, PMI not laterally displaced GI- soft, NT, ND, + BS Extremities- no clubbing, cyanosis, +1edema MS- no significant deformity or atrophy Skin- no rash or lesion Psych- euthymic mood, full affect Neuro- strength and sensation are intact  ekg today reveals sinus rhythm 55 bpm, PR 224, incomplete LBBB  Assessment and  Plan:  1. afib Controlled She has been treated with xarelto 15mg  daily due to frequent CrCl < 50, though she does hover right around 50.  If she has further GI bleeding, we could consider eliquis as an alternative though I cannot be sure  that her risks of bleeding would actually be lower.  2. Chronic diastolic dysfunction.  Much improved.  No changes today  3. CRI Stable Continue to follow with nephrology She needs BMET with Dr [name] later this week  4. HTN Stable No change required today  5. Exertional SOB and chest tightness Resolved Prior myoview was low risk Medical management is advised  6. GI bleeding/ iron deficiency anemia As above Cbc upon follow-up with Dr [name]  Return to see Cecille Rubin in 4 months I will see in 8 months

## 2013-03-07 NOTE — Patient Instructions (Signed)
Your physician wants you to follow-up in: 4 months with Truitt Merle, NP and Dr Rayann Heman in 8 months You will receive a reminder letter in the mail two months in advance. If you don't receive a letter, please call our office to schedule the follow-up appointment.

## 2013-03-16 ENCOUNTER — Ambulatory Visit
Admission: RE | Admit: 2013-03-16 | Discharge: 2013-03-16 | Disposition: A | Discharge status: Home / Self Care | Payer: Medicare Other | Source: Ambulatory Visit

## 2013-03-16 ENCOUNTER — Telehealth: Payer: Self-pay

## 2013-03-16 DIAGNOSIS — Z1231 Encounter for screening mammogram for malignant neoplasm of breast: Secondary | ICD-10-CM

## 2013-03-16 NOTE — Telephone Encounter (Signed)
Grandson called for samples of bystolic 20mg . Left him message to call the office

## 2013-03-18 ENCOUNTER — Other Ambulatory Visit: Payer: Self-pay | Admitting: Internal Medicine

## 2013-03-18 DIAGNOSIS — R928 Other abnormal and inconclusive findings on diagnostic imaging of breast: Secondary | ICD-10-CM

## 2013-04-13 ENCOUNTER — Ambulatory Visit
Admission: RE | Admit: 2013-04-13 | Discharge: 2013-04-13 | Disposition: A | Discharge status: Home / Self Care | Payer: Medicare Other | Source: Ambulatory Visit | Attending: Internal Medicine | Admitting: Internal Medicine

## 2013-04-13 ENCOUNTER — Other Ambulatory Visit: Payer: Self-pay | Admitting: Internal Medicine

## 2013-04-13 DIAGNOSIS — R928 Other abnormal and inconclusive findings on diagnostic imaging of breast: Secondary | ICD-10-CM

## 2013-05-04 ENCOUNTER — Inpatient Hospital Stay: Admission: RE | Admit: 2013-05-04 | Payer: Medicare Other | Source: Ambulatory Visit

## 2013-05-11 ENCOUNTER — Other Ambulatory Visit: Payer: Self-pay | Admitting: Internal Medicine

## 2013-05-11 ENCOUNTER — Ambulatory Visit
Admission: RE | Admit: 2013-05-11 | Discharge: 2013-05-11 | Disposition: A | Discharge status: Home / Self Care | Payer: Self-pay | Source: Ambulatory Visit | Attending: Internal Medicine | Admitting: Internal Medicine

## 2013-05-11 ENCOUNTER — Telehealth: Payer: Self-pay | Admitting: *Deleted

## 2013-05-11 DIAGNOSIS — R928 Other abnormal and inconclusive findings on diagnostic imaging of breast: Secondary | ICD-10-CM

## 2013-05-11 NOTE — Telephone Encounter (Signed)
Truitt Merle, NP received phone call from Dr. Luberta Robertson at the [facility] regarding [name] - patient of Dr. Jackalyn Lombard. She has more calcifications on mammogram and will need to have stereotactic biopsy. He will have her hold her Xarelto a few days prior to this procedure. Cecille Rubin has said that this was ok.

## 2013-05-19 ENCOUNTER — Ambulatory Visit
Admission: RE | Admit: 2013-05-19 | Discharge: 2013-05-19 | Disposition: A | Discharge status: Home / Self Care | Payer: Medicare Other | Source: Ambulatory Visit | Attending: Internal Medicine | Admitting: Internal Medicine

## 2013-05-19 ENCOUNTER — Ambulatory Visit: Admission: RE | Admit: 2013-05-19 | Payer: Medicare Other | Source: Ambulatory Visit

## 2013-05-19 ENCOUNTER — Other Ambulatory Visit: Payer: Self-pay | Admitting: Internal Medicine

## 2013-05-19 DIAGNOSIS — R928 Other abnormal and inconclusive findings on diagnostic imaging of breast: Secondary | ICD-10-CM

## 2013-05-19 DIAGNOSIS — C50912 Malignant neoplasm of unspecified site of left female breast: Secondary | ICD-10-CM

## 2013-05-19 HISTORY — DX: Malignant neoplasm of unspecified site of left female breast: C50.912

## 2013-05-20 ENCOUNTER — Telehealth: Payer: Self-pay | Admitting: *Deleted

## 2013-05-20 ENCOUNTER — Other Ambulatory Visit: Payer: Self-pay | Admitting: Internal Medicine

## 2013-05-20 DIAGNOSIS — C50412 Malignant neoplasm of upper-outer quadrant of left female breast: Secondary | ICD-10-CM

## 2013-05-20 DIAGNOSIS — C50919 Malignant neoplasm of unspecified site of unspecified female breast: Secondary | ICD-10-CM

## 2013-05-20 NOTE — Telephone Encounter (Signed)
Confirmed [facility] for 06/01/13 at 8am .  Instructions and contact information given.

## 2013-05-23 ENCOUNTER — Other Ambulatory Visit (INDEPENDENT_AMBULATORY_CARE_PROVIDER_SITE_OTHER): Payer: Self-pay | Admitting: General Surgery

## 2013-05-23 DIAGNOSIS — C50919 Malignant neoplasm of unspecified site of unspecified female breast: Secondary | ICD-10-CM

## 2013-05-24 ENCOUNTER — Ambulatory Visit
Admission: RE | Admit: 2013-05-24 | Discharge: 2013-05-24 | Disposition: A | Discharge status: Home / Self Care | Payer: Medicare Other | Source: Ambulatory Visit | Attending: Internal Medicine | Admitting: Internal Medicine

## 2013-05-24 DIAGNOSIS — C50919 Malignant neoplasm of unspecified site of unspecified female breast: Secondary | ICD-10-CM

## 2013-05-24 MED ORDER — GADOBENATE DIMEGLUMINE 529 MG/ML IV SOLN
10.0000 mL | Freq: Once | INTRAVENOUS | Status: AC | PRN
  Administered 2013-05-24: 10 mL via INTRAVENOUS

## 2013-06-01 ENCOUNTER — Ambulatory Visit
Admission: RE | Admit: 2013-06-01 | Discharge: 2013-06-01 | Disposition: A | Discharge status: Home / Self Care | Payer: Medicare Other | Source: Ambulatory Visit | Attending: Radiation Oncology | Admitting: Radiation Oncology

## 2013-06-01 ENCOUNTER — Ambulatory Visit (HOSPITAL_BASED_OUTPATIENT_CLINIC_OR_DEPARTMENT_OTHER): Payer: Medicare Other | Admitting: Oncology

## 2013-06-01 ENCOUNTER — Telehealth: Payer: Self-pay | Admitting: Oncology

## 2013-06-01 ENCOUNTER — Ambulatory Visit (HOSPITAL_BASED_OUTPATIENT_CLINIC_OR_DEPARTMENT_OTHER): Payer: Medicare Other | Admitting: General Surgery

## 2013-06-01 ENCOUNTER — Other Ambulatory Visit (HOSPITAL_BASED_OUTPATIENT_CLINIC_OR_DEPARTMENT_OTHER): Payer: Medicare Other

## 2013-06-01 ENCOUNTER — Encounter: Payer: Self-pay | Admitting: *Deleted

## 2013-06-01 ENCOUNTER — Encounter (INDEPENDENT_AMBULATORY_CARE_PROVIDER_SITE_OTHER): Payer: Self-pay

## 2013-06-01 ENCOUNTER — Encounter: Payer: Self-pay | Admitting: Oncology

## 2013-06-01 ENCOUNTER — Ambulatory Visit: Payer: Medicare Other | Admitting: Physical Therapy

## 2013-06-01 ENCOUNTER — Ambulatory Visit: Payer: Medicare Other

## 2013-06-01 VITALS — BP 166/70 | HR 60 | Temp 98.0°F | Resp 20 | Ht 64.5 in | Wt 240.8 lb

## 2013-06-01 DIAGNOSIS — C50419 Malignant neoplasm of upper-outer quadrant of unspecified female breast: Secondary | ICD-10-CM

## 2013-06-01 DIAGNOSIS — Z170 Estrogen receptor positive status [ER+]: Secondary | ICD-10-CM

## 2013-06-01 DIAGNOSIS — C50412 Malignant neoplasm of upper-outer quadrant of left female breast: Secondary | ICD-10-CM

## 2013-06-01 DIAGNOSIS — M7989 Other specified soft tissue disorders: Secondary | ICD-10-CM

## 2013-06-01 LAB — CBC WITH DIFFERENTIAL/PLATELET
BASO%: 0.7 % (ref 0.0–2.0)
Basophils Absolute: 0.1 10*3/uL (ref 0.0–0.1)
EOS%: 2.4 % (ref 0.0–7.0)
Eosinophils Absolute: 0.2 10*3/uL (ref 0.0–0.5)
HCT: 40.2 % (ref 34.8–46.6)
HGB: 13.2 g/dL (ref 11.6–15.9)
LYMPH%: 16.9 % (ref 14.0–49.7)
MCH: 30.2 pg (ref 25.1–34.0)
MCHC: 32.8 g/dL (ref 31.5–36.0)
MCV: 92.1 fL (ref 79.5–101.0)
MONO#: 0.6 10*3/uL (ref 0.1–0.9)
MONO%: 8.3 % (ref 0.0–14.0)
NEUT#: 5.6 10*3/uL (ref 1.5–6.5)
NEUT%: 71.7 % (ref 38.4–76.8)
Platelets: 261 10*3/uL (ref 145–400)
RBC: 4.36 10*6/uL (ref 3.70–5.45)
RDW: 14.8 % — AB (ref 11.2–14.5)
WBC: 7.7 10*3/uL (ref 3.9–10.3)
lymph#: 1.3 10*3/uL (ref 0.9–3.3)

## 2013-06-01 LAB — COMPREHENSIVE METABOLIC PANEL (CC13)
ALK PHOS: 87 U/L (ref 40–150)
ALT: 12 U/L (ref 0–55)
AST: 15 U/L (ref 5–34)
Albumin: 4 g/dL (ref 3.5–5.0)
Anion Gap: 13 mEq/L — ABNORMAL HIGH (ref 3–11)
BUN: 36.1 mg/dL — ABNORMAL HIGH (ref 7.0–26.0)
CO2: 27 mEq/L (ref 22–29)
CREATININE: 1.4 mg/dL — AB (ref 0.6–1.1)
Calcium: 10.1 mg/dL (ref 8.4–10.4)
Chloride: 104 mEq/L (ref 98–109)
Glucose: 94 mg/dl (ref 70–140)
POTASSIUM: 3.7 meq/L (ref 3.5–5.1)
Sodium: 145 mEq/L (ref 136–145)
Total Bilirubin: 0.3 mg/dL (ref 0.20–1.20)
Total Protein: 6.9 g/dL (ref 6.4–8.3)

## 2013-06-01 NOTE — Assessment & Plan Note (Signed)
The patient has a new diagnosis of a T2 N0 left breast cancer.  She is a candidate for breast conservation. We discussed performing a radioactive seed/needle localized lumpectomy. Because of her age and overall health status, I would not perform a sentinel lymph node biopsy on her period so the risk outweighed the benefits of this would not affect our treatment algorithm. I discussed this with her and her grandson. I reviewed that I would need to have cardiac clearance from Dr. Rayann Heman prior to performing surgery. She is also relative and this will need to be held for around 4 days prior to surgery.  She also has heart failure and atrial fibrillation.    I discussed risks of surgery including bleeding, infection, damage to adjacent structures, pain, and possible risk of needing additional surgeries to clear margins.    She will need genetic counseling.  She will also get post op radiation and antihormone therapy.    Pt is eager to have surgery.

## 2013-06-01 NOTE — Telephone Encounter (Signed)
, °

## 2013-06-01 NOTE — Progress Notes (Signed)
Chief complaint:  Left breast cancer  HISTORY: Patient is a 80 year old female who presents with screening detected left breast calcifications.  She has not ever had an abnormal mammogram before. This area was approximately 2.1 cm. She subsequently underwent MRI which demonstrated post biopsy changes. Biopsy was positive for a grade 1 invasive ductal carcinoma that was ER and PR positive and HER-2 negative. Ki-67 was 6%.  She is a G0. She had a hysterectomy at age 58. She does have a sister had breast cancer at age 41. She is accompanied by her grandson. She does have a history of heart failure and atrial fibrillation. She is on Xarelto, flecainide, Lasix, amlodipine, bystolic, and Symbicort.  She has not had issues with her breasts in the past.  Past Medical History  Diagnosis Date  . Asthma   . Shortness of breath   . Arthritis   . Anxiety   . Hypertension   . Atrial fibrillation     a. 04/04/11  . Osteoarthritis     a. 03/28/11 - R Total Hip Arthroplasty  . Cataracts, both eyes   . Pneumonia   . Bronchitis   . Pleurisy   . Ankle swelling   . Kidney infection   . Frequent urination   . Excessive urination at night   . Chronic headaches   . Weight gain   . CHF (congestive heart failure)     Past Surgical History  Procedure Laterality Date  . Cardiac catheterization      1980's or 90's - reportedly normal  . Abdominal hysterectomy  1962  . Appendectomy  1954  . Cholecystectomy    . Back surgery  1990  . Cataract extraction bilateral w/ anterior vitrectomy  2007/2008  . Total hip arthroplasty  03/28/2011    Procedure: TOTAL HIP ARTHROPLASTY;  Surgeon: Yvette Rack., MD;  Location: [facility];  Service: Orthopedics;  Laterality: Right;  . Cardioversion  04/05/2011    Procedure: CARDIOVERSION;  Surgeon: Coralyn Mark, MD;  Location: [facility];  Service: Cardiovascular;  Laterality: N/A;  . Tonsillectomy  1943  . Adenoidectomy  1949  . Foot surgery  1947  . Thoracic disc surgery  2008   . Lumbar disc surgery  2010  . Knee surgery       both knees  . Shoulder surgery      left  . Total hip arthroplasty  03/28/2011    right  . Cardioversion N/A 06/19/2012    Procedure: CARDIOVERSION-bedside(3W36);  Surgeon: Lelon Perla, MD;  Location: [facility] OR;  Service: Cardiovascular;  Laterality: N/A;    Current Outpatient Prescriptions  Medication Sig Dispense Refill  . acetaminophen (TYLENOL) 325 MG tablet Take 650 mg by mouth 2 (two) times daily as needed (temp above 102).      Marland Kitchen albuterol (PROVENTIL HFA;VENTOLIN HFA) 108 (90 BASE) MCG/ACT inhaler Inhale 2 puffs into the lungs every 6 (six) hours as needed for wheezing.      Marland Kitchen amLODipine (NORVASC) 10 MG tablet Take 10 mg by mouth daily.      . chlorzoxazone (PARAFON) 500 MG tablet Take 250 mg by mouth 3 (three) times daily as needed for muscle spasms.      . flecainide (TAMBOCOR) 100 MG tablet Take 1 tablet (100 mg total) by mouth every 12 (twelve) hours.  180 tablet  3  . furosemide (LASIX) 20 MG tablet Take 2 tablets (40 mg total) by mouth daily.  30 tablet  3  . Glucosamine-Chondroit-Vit C-Mn (  GLUCOSAMINE CHONDR 1500 COMPLX PO) Take 1 tablet by mouth daily.      . IRON PO Liquid Iron - Take 5 mg twice a day      . Multiple Vitamins-Minerals (ICAPS PO) Take 1 tablet by mouth 2 (two) times daily.      . Nebivolol HCl 20 MG TABS Take 1 tablet (20 mg total) by mouth daily.  90 tablet  3  . omeprazole (PRILOSEC) 20 MG capsule Take 20 mg by mouth daily.      . Rivaroxaban (XARELTO) 15 MG TABS tablet Take 1 tablet (15 mg total) by mouth daily.  90 tablet  3   No current facility-administered medications for this visit.     Allergies  Allergen Reactions  . Methocarbamol Hives  . Vicodin [Hydrocodone-Acetaminophen] Other (See Comments)    Interacts with bp medication and drops blood pressure  . Aspirin Nausea And Vomiting  . Butazolidin [Phenylbutazone] Other (See Comments)    unknown  . Celebrex [Celecoxib] Nausea And  Vomiting    Nausea and vomiting  . Codeine Nausea And Vomiting  . Doripenem Other (See Comments)    unknown  . Aspirin Buf(Alhyd-Mghyd-Cacar) Nausea And Vomiting  . Temazepam Other (See Comments)    unknown     Family History  Problem Relation Age of Onset  . Diabetes Father     Father, Mother, 4 sisters (2 living)  . Heart attack Father     (Deceased)  . Heart failure Father     (deceased 52)  . Hypertension Father   . Stroke Mother     (deceased 30)  . Hypertension Mother   . Cancer Sister     (deceased)  . Breast cancer Sister   . Heart attack Sister   . Diabetes Sister      History   Social History  . Marital Status: Widowed    Spouse Name: N/A    Number of Children: N/A  . Years of Education: N/A   Occupational History  . Retired    Social History Main Topics  . Smoking status: Former Smoker -- 21 years    Types: Cigarettes    Quit date: 03/17/1970  . Smokeless tobacco: Former Systems developer  . Alcohol Use: No  . Drug Use: No  . Sexual Activity: Not Currently   Other Topics Concern  . Not on file   Social History Narrative   Lives alone in [place].  Widowed in 2012 (husband w/ dementia & parkinsons)..   Not active, does not drive due to vision problems.     REVIEW OF SYSTEMS - PERTINENT POSITIVES ONLY: 12 point review of systems negative other than HPI and PMH except for joint pain, shortness of breath, back pain, anxiety, and weakness.    EXAM: Wt Readings from Last 3 Encounters:  06/01/13 240 lb 12.8 oz (109.226 kg)  03/07/13 235 lb 10.4 oz (106.89 kg)  11/17/12 232 lb 1.9 oz (105.289 kg)   Temp Readings from Last 3 Encounters:  06/01/13 98 F (36.7 C) Oral  09/22/12 97.8 F (36.6 C) Oral  07/16/12 97.6 F (36.4 C) Oral   BP Readings from Last 3 Encounters:  06/01/13 166/70  03/07/13 132/78  11/17/12 120/60   Pulse Readings from Last 3 Encounters:  06/01/13 60  03/07/13 55  11/17/12 60     Gen:  No acute distress.  Well nourished and  well groomed.   Neurological: Alert and oriented to person, place, and time. Coordination sl impaired, uses walker.  Head: Normocephalic and atraumatic.  Eyes: Conjunctivae are normal. Pupils are equal, round, and reactive to light. No scleral icterus.  Neck: Normal range of motion. Neck supple. No tracheal deviation or thyromegaly present.  Cardiovascular: Normal rate, regular rhythm, normal heart sounds and intact distal pulses.  Exam reveals no gallop and no friction rub.  No murmur heard.   Breast: No palpable masses.  No skin dimpling.  No nipple retraction.  No lymphadenopathy.  She is not having any nipple retraction.   Respiratory: Effort normal.  No respiratory distress. No chest wall tenderness. Breath sounds normal.  No wheezes, rales or rhonchi.  GI: Soft. Bowel sounds are normal. The abdomen is soft and nontender.  There is no rebound and no guarding.  Musculoskeletal: Normal range of motion. Extremities are nontender.  Lymphadenopathy: No cervical, preauricular, postauricular or axillary adenopathy is present Skin: Skin is warm and dry. No rash noted. No diaphoresis. No erythema. No pallor. No clubbing, cyanosis, or edema.   Psychiatric: Normal mood and affect. Behavior is normal. Judgment and thought content normal.    LABORATORY RESULTS: Available labs are reviewed   Pathology Diagnosis Breast, left, needle core biopsy, upper outer - INVASIVE DUCTAL CARCINOMA. - FIBROCYSTIC CHANGES WITH CALCIFICATIONS. - SEE COMMENT.  Recent Results (from the past 2160 hour(s))  CBC WITH DIFFERENTIAL     Status: Abnormal   Collection Time    06/01/13  8:17 AM      Result Value Ref Range   WBC 7.7  3.9 - 10.3 10e3/uL   NEUT# 5.6  1.5 - 6.5 10e3/uL   HGB 13.2  11.6 - 15.9 g/dL   HCT 40.2  34.8 - 46.6 %   Platelets 261  145 - 400 10e3/uL   MCV 92.1  79.5 - 101.0 fL   MCH 30.2  25.1 - 34.0 pg   MCHC 32.8  31.5 - 36.0 g/dL   RBC 4.36  3.70 - 5.45 10e6/uL   RDW 14.8 (*) 11.2 - 14.5 %    lymph# 1.3  0.9 - 3.3 10e3/uL   MONO# 0.6  0.1 - 0.9 10e3/uL   Eosinophils Absolute 0.2  0.0 - 0.5 10e3/uL   Basophils Absolute 0.1  0.0 - 0.1 10e3/uL   NEUT% 71.7  38.4 - 76.8 %   LYMPH% 16.9  14.0 - 49.7 %   MONO% 8.3  0.0 - 14.0 %   EOS% 2.4  0.0 - 7.0 %   BASO% 0.7  0.0 - 2.0 %  COMPREHENSIVE METABOLIC PANEL (MV78)     Status: Abnormal   Collection Time    06/01/13  8:18 AM      Result Value Ref Range   Sodium 145  136 - 145 mEq/L   Potassium 3.7  3.5 - 5.1 mEq/L   Chloride 104  98 - 109 mEq/L   CO2 27  22 - 29 mEq/L   Glucose 94  70 - 140 mg/dl   BUN 36.1 (*) 7.0 - 26.0 mg/dL   Creatinine 1.4 (*) 0.6 - 1.1 mg/dL   Total Bilirubin 0.30  0.20 - 1.20 mg/dL   Alkaline Phosphatase 87  40 - 150 U/L   AST 15  5 - 34 U/L   ALT 12  0 - 55 U/L   Total Protein 6.9  6.4 - 8.3 g/dL   Albumin 4.0  3.5 - 5.0 g/dL   Calcium 10.1  8.4 - 10.4 mg/dL   Anion Gap 13 (*) 3 - 11 mEq/L  RADIOLOGY RESULTS: See E-Chart or I-Site for most recent results.  Images and reports are reviewed.  Mr Breast Bilateral W Wo Contrast  05/26/2013   CLINICAL DATA:  Biopsy left upper outer quadrant calcifications reveal invasive ductal carcinoma  LABS:  BUN and creatinine were obtained on site at [facility] at  [address]  Results:  BUN 32 mg/dL,  Creatinine 1.3 mg/dL.  EXAM: BILATERAL BREAST MRI WITH AND WITHOUT CONTRAST  TECHNIQUE: Multiplanar, multisequence MR images of both breasts were obtained prior to and following the intravenous administration of 39m of MultiHance.  THREE-DIMENSIONAL MR IMAGE RENDERING ON INDEPENDENT WORKSTATION:  Three-dimensional MR images were rendered by post-processing of the original MR data on an independent workstation. The three-dimensional MR images were interpreted, and findings are reported in the following complete MRI report for this study. Three dimensional images were evaluated at the independent DynaCad workstation  COMPARISON:  Previous exams   FINDINGS: Breast composition: c:  Heterogeneous fibroglandular tissue  Background parenchymal enhancement: Marked  Right breast: No mass or abnormal enhancement.  Left breast: No evidence of mass or abnormal enhancement. There is a marker clip from recent stereotactic biopsy identified at the middle third depth in the 2 o'clock position, with a thin tract of T2 hyperintensity and enhancement extending from the lateral dermis down to the biopsy cavity. There is no evidence of mass on MRI.  Lymph nodes: No abnormal appearing lymph nodes.  Ancillary findings: There is moderate left and mild right subareolar duct ectasia. There is a particularly dilated left subareolar duct demonstrating proteinaceous fluid contents. There is no evidence of enhancement within or around the dilated ducts, areola, or nipple on either side.  IMPRESSION: Post biopsy change corresponding to calcifications associated with invasive ductal carcinoma of the left breast. No other suspicious findings.  RECOMMENDATION: Proceed with treatment planning  BI-RADS CATEGORY  6: Known biopsy-proven malignancy.   Electronically Signed   By: RSkipper ClicheM.D.   On: 05/26/2013 11:08   Mm Lt Breast Bx W Loc Dev 1st Lesion Image Bx Spec Stereo Guide  05/20/2013   ADDENDUM REPORT: 05/20/2013 08:26  ADDENDUM: Final pathology demonstrates INVASIVE DUCTAL CARCINOMA- grade 1.  Histology correlates with imaging findings.  The patient was contacted by phone on 05/20/2013 and these results given to her which she understood. Her questions were answered.  The patient had no complaints with her biopsy site. .  Recommend surgery/ oncology consultation. [facility] and the patient informed.  Recommend bilateral breast MRI which will be scheduled by our office and the patient notified.   Electronically Signed   By: JHassan RowanM.D.   On: 05/20/2013 08:26   05/20/2013   CLINICAL DATA:   80year old female for tissue sampling of suspicious calcifications in the upper outer left breast.  EXAM: LEFT STEREOTACTIC CORE NEEDLE BIOPSY  COMPARISON:  Previous exams.  FINDINGS: The patient and I discussed the procedure of stereotactic-guided biopsy including benefits and alternatives. We discussed the high likelihood of a successful procedure. We discussed the risks of the procedure including infection, bleeding, tissue injury, clip migration, and inadequate sampling. Informed written consent was given. The usual time out protocol was performed immediately prior to the procedure.  Using sterile technique and 2% Lidocaine as local anesthetic, under stereotactic guidance, a 12 gauge vacuum assisted needle device was used to perform core needle biopsy of calcifications in the upper outer left breast using a superior  approach. Specimen radiograph was performed showing calcifications. Specimens with calcifications are identified for pathology.  At the conclusion of the procedure, a coil shaped tissue marker clip was deployed into the biopsy cavity. Follow-up 2-view mammogram confirmed clip clip placement to be satisfactory.  IMPRESSION: Stereotactic-guided biopsy of left breast calcifications. No apparent complications.  Pathology will be followed.  Electronically Signed: By: Hassan Rowan M.D. On: 05/19/2013 10:35      ASSESSMENT AND PLAN: Breast cancer of upper-outer quadrant of left female breast The patient has a new diagnosis of a T2 N0 left breast cancer.  She is a candidate for breast conservation. We discussed performing a radioactive seed/needle localized lumpectomy. Because of her age and overall health status, I would not perform a sentinel lymph node biopsy on her period so the risk outweighed the benefits of this would not affect our treatment algorithm. I discussed this with her and her grandson. I reviewed that I would need to have cardiac clearance from Dr. Rayann Heman prior to performing surgery.  She is also relative and this will need to be held for around 4 days prior to surgery.  She also has heart failure and atrial fibrillation.    I discussed risks of surgery including bleeding, infection, damage to adjacent structures, pain, and possible risk of needing additional surgeries to clear margins.    She will need genetic counseling.  She will also get post op radiation and antihormone therapy.    Pt is eager to have surgery.       Milus Height MD Surgical Oncology, General and [facility]      Visit Diagnoses: 1. Breast cancer of upper-outer quadrant of left female breast     Primary Care Physician: [name], MD   [place], [place], [place].

## 2013-06-01 NOTE — Progress Notes (Signed)
Checked in new pt with no financial concerns. °

## 2013-06-01 NOTE — Progress Notes (Signed)
Radiation Oncology         [phone]) [phone] ________________________________  Initial outpatient Consultation - Date: 06/01/2013   Name: Jordan Byrd MRN: [id number]   DOB: 25-Jul-1933  REFERRING PHYSICIAN: Stark Klein, MD  DIAGNOSIS: T2N0 Invasive Ductal Carcinoma of left breast  STAGE: Breast cancer of upper-outer quadrant of left female breast   Primary site: Breast (Left)   Staging method: AJCC 7th Edition   Clinical: Stage IIA (T2, N0, cM0)   Summary: Stage IIA (T2, N0, cM0)   Clinical comments: Staged at breast conference 06/01/13.   HISTORY OF PRESENT ILLNESS::Jordan Byrd is a 80 y.o. female  Coumadin for a screening mammogram. Calcifications are seen in the left breast. She had a biopsy which showed a grade 1 invasive ductal carcinoma which was ER/PR positive. Ki-67 was 6%. The calcifications measured about 2 cm. MRI just showed biopsy change. She presents for multidisciplinary clinic today. She has no history of breast cancer. No history of radiation. She is accompanied by her grandson. She lives near [place] then. She is G 0. She had her first menses at 29 and had hysterectomy at the age of 80. She was on hormone replacement until the age of 72.  PREVIOUS RADIATION THERAPY: No  PAST MEDICAL HISTORY:  has a past medical history of Asthma; Shortness of breath; Arthritis; Anxiety; Hypertension; Atrial fibrillation; Osteoarthritis; Cataracts, both eyes; Pneumonia; Bronchitis; Pleurisy; Ankle swelling; Kidney infection; Frequent urination; Excessive urination at night; Chronic headaches; Weight gain; and CHF (congestive heart failure).    PAST SURGICAL HISTORY: Past Surgical History  Procedure Laterality Date  . Cardiac catheterization      1980's or 90's - reportedly normal  . Abdominal hysterectomy  1962  . Appendectomy  1954  . Cholecystectomy    . Back surgery  1990  . Cataract extraction bilateral w/ anterior vitrectomy  2007/2008  . Total hip arthroplasty  03/28/2011    Procedure: TOTAL HIP ARTHROPLASTY;  Surgeon: Yvette Rack., MD;  Location: [facility];  Service: Orthopedics;  Laterality: Right;  . Cardioversion  04/05/2011    Procedure: CARDIOVERSION;  Surgeon: Coralyn Mark, MD;  Location: [facility];  Service: Cardiovascular;  Laterality: N/A;  . Tonsillectomy  1943  . Adenoidectomy  1949  . Foot surgery  1947  . Thoracic disc surgery  2008  . Lumbar disc surgery  2010  . Knee surgery       both knees  . Shoulder surgery      left  . Total hip arthroplasty  03/28/2011    right  . Cardioversion N/A 06/19/2012    Procedure: CARDIOVERSION-bedside(3W36);  Surgeon: Lelon Perla, MD;  Location: [facility] OR;  Service: Cardiovascular;  Laterality: N/A;    FAMILY HISTORY:  Family History  Problem Relation Age of Onset  . Diabetes Father     Father, Mother, 4 sisters (2 living)  . Heart attack Father     (Deceased)  . Heart failure Father     (deceased 66)  . Hypertension Father   . Stroke Mother     (deceased 33)  . Hypertension Mother   . Cancer Sister     (deceased)  . Breast cancer Sister   . Heart attack Sister   . Diabetes Sister     SOCIAL HISTORY:  History  Substance Use Topics  . Smoking status: Former Smoker -- 21 years    Types: Cigarettes    Quit date: 03/17/1970  . Smokeless tobacco: Former Systems developer  .  Alcohol Use: No    ALLERGIES: Methocarbamol; Vicodin; Aspirin; Butazolidin; Celebrex; Codeine; Doripenem; Aspirin buf(alhyd-mghyd-cacar); and Temazepam  MEDICATIONS:  Current Outpatient Prescriptions  Medication Sig Dispense Refill  . acetaminophen (TYLENOL) 325 MG tablet Take 650 mg by mouth 2 (two) times daily as needed (temp above 102).      Marland Kitchen albuterol (PROVENTIL HFA;VENTOLIN HFA) 108 (90 BASE) MCG/ACT inhaler Inhale 2 puffs into the lungs every 6 (six) hours as needed for wheezing.      Marland Kitchen amLODipine (NORVASC) 10 MG tablet Take 10 mg by mouth daily.      . chlorzoxazone (PARAFON) 500 MG tablet Take 250 mg by mouth 3 (three) times  daily as needed for muscle spasms.      . flecainide (TAMBOCOR) 100 MG tablet Take 1 tablet (100 mg total) by mouth every 12 (twelve) hours.  180 tablet  3  . furosemide (LASIX) 20 MG tablet Take 2 tablets (40 mg total) by mouth daily.  30 tablet  3  . Glucosamine-Chondroit-Vit C-Mn (GLUCOSAMINE CHONDR 1500 COMPLX PO) Take 1 tablet by mouth daily.      . IRON PO Liquid Iron - Take 5 mg twice a day      . Multiple Vitamins-Minerals (ICAPS PO) Take 1 tablet by mouth 2 (two) times daily.      . Nebivolol HCl 20 MG TABS Take 1 tablet (20 mg total) by mouth daily.  90 tablet  3  . omeprazole (PRILOSEC) 20 MG capsule Take 20 mg by mouth daily.      . Rivaroxaban (XARELTO) 15 MG TABS tablet Take 1 tablet (15 mg total) by mouth daily.  90 tablet  3   No current facility-administered medications for this encounter.    REVIEW OF SYSTEMS:  A 15 point review of systems is documented in the electronic medical record. This was obtained by the nursing staff. However, I reviewed this with the patient to discuss relevant findings and make appropriate changes.  Pertinent items are noted in HPI.  PHYSICAL EXAM:  Pleasant female with large pendulous breasts. She has a palpable mass in the outer quadrant at the 3 to 4:00 position. No palpable lymphadenopathy. Alert and oriented x3. 5 out of 5 strength bilaterally.  LABORATORY DATA:  Lab Results  Component Value Date   WBC 7.7 06/01/2013   HGB 13.2 06/01/2013   HCT 40.2 06/01/2013   MCV 92.1 06/01/2013   PLT 261 06/01/2013   Lab Results  Component Value Date   NA 145 06/01/2013   K 3.7 06/01/2013   CL 100 11/17/2012   CO2 27 06/01/2013   Lab Results  Component Value Date   ALT 12 06/01/2013   AST 15 06/01/2013   ALKPHOS 87 06/01/2013   BILITOT 0.30 06/01/2013     RADIOGRAPHY: Mr Breast Bilateral W Wo Contrast  05/26/2013   CLINICAL DATA:  Biopsy left upper outer quadrant calcifications reveal invasive ductal carcinoma  LABS:  BUN and creatinine were obtained  on site at [facility] at  [address]  Results:  BUN 32 mg/dL,  Creatinine 1.3 mg/dL.  EXAM: BILATERAL BREAST MRI WITH AND WITHOUT CONTRAST  TECHNIQUE: Multiplanar, multisequence MR images of both breasts were obtained prior to and following the intravenous administration of 24m of MultiHance.  THREE-DIMENSIONAL MR IMAGE RENDERING ON INDEPENDENT WORKSTATION:  Three-dimensional MR images were rendered by post-processing of the original MR data on an independent workstation. The three-dimensional MR images were interpreted, and findings are reported in the following  complete MRI report for this study. Three dimensional images were evaluated at the independent DynaCad workstation  COMPARISON:  Previous exams  FINDINGS: Breast composition: c:  Heterogeneous fibroglandular tissue  Background parenchymal enhancement: Marked  Right breast: No mass or abnormal enhancement.  Left breast: No evidence of mass or abnormal enhancement. There is a marker clip from recent stereotactic biopsy identified at the middle third depth in the 2 o'clock position, with a thin tract of T2 hyperintensity and enhancement extending from the lateral dermis down to the biopsy cavity. There is no evidence of mass on MRI.  Lymph nodes: No abnormal appearing lymph nodes.  Ancillary findings: There is moderate left and mild right subareolar duct ectasia. There is a particularly dilated left subareolar duct demonstrating proteinaceous fluid contents. There is no evidence of enhancement within or around the dilated ducts, areola, or nipple on either side.  IMPRESSION: Post biopsy change corresponding to calcifications associated with invasive ductal carcinoma of the left breast. No other suspicious findings.  RECOMMENDATION: Proceed with treatment planning  BI-RADS CATEGORY  6: Known biopsy-proven malignancy.   Electronically Signed   By: Skipper Cliche M.D.   On: 05/26/2013 11:08   Mm Lt Breast Bx W Loc Dev 1st Lesion Image Bx  Spec Stereo Guide  05/20/2013   ADDENDUM REPORT: 05/20/2013 08:26  ADDENDUM: Final pathology demonstrates INVASIVE DUCTAL CARCINOMA- grade 1.  Histology correlates with imaging findings.  The patient was contacted by phone on 05/20/2013 and these results given to her which she understood. Her questions were answered.  The patient had no complaints with her biopsy site. .  Recommend surgery/ oncology consultation. An appointment at the [facility] has been scheduled for 06/01/2013 and the patient informed.  Recommend bilateral breast MRI which will be scheduled by our office and the patient notified.   Electronically Signed   By: Hassan Rowan M.D.   On: 05/20/2013 08:26   05/20/2013   CLINICAL DATA:  80 year old female for tissue sampling of suspicious calcifications in the upper outer left breast.  EXAM: LEFT STEREOTACTIC CORE NEEDLE BIOPSY  COMPARISON:  Previous exams.  FINDINGS: The patient and I discussed the procedure of stereotactic-guided biopsy including benefits and alternatives. We discussed the high likelihood of a successful procedure. We discussed the risks of the procedure including infection, bleeding, tissue injury, clip migration, and inadequate sampling. Informed written consent was given. The usual time out protocol was performed immediately prior to the procedure.  Using sterile technique and 2% Lidocaine as local anesthetic, under stereotactic guidance, a 12 gauge vacuum assisted needle device was used to perform core needle biopsy of calcifications in the upper outer left breast using a superior approach. Specimen radiograph was performed showing calcifications. Specimens with calcifications are identified for pathology.  At the conclusion of the procedure, a coil shaped tissue marker clip was deployed into the biopsy cavity. Follow-up 2-view mammogram confirmed clip clip placement to be satisfactory.  IMPRESSION: Stereotactic-guided biopsy of left breast calcifications.  No apparent complications.  Pathology will be followed.  Electronically Signed: By: Hassan Rowan M.D. On: 05/19/2013 10:35      IMPRESSION: 80 year old with a T2 N0 invasive ductal carcinoma the left breast  PLAN: I spoke with the patient and her grandson regarding her diagnosis and options for treatment. We discussed that she has a large invasive cancer and would benefit from radiation in terms of local control. In addition Dr. Barry Dienes has discussed a lumpectomy without sentinel lymph node biopsy so the  use of high tangents to treat the axillary lymph nodes would be appropriate in her case. Because of her family history she's been referred to genetics. Dr. Jana Hakim has recommended aromatase inhibitors. We discussed the process of simulation and the placement tattoos. We discussed skin redness and fatigue as possible side effects. We discussed the low likelihood of symptomatic heart or lung damage. This is especially important given her cardiac history. I will plan on seeing her back after surgery.  I spent 60 minutes  face to face with the patient and more than 50% of that time was spent in counseling and/or coordination of care.   ------------------------------------------------  Thea Silversmith, MD

## 2013-06-01 NOTE — Progress Notes (Signed)
Jordan Byrd  [phone] [phone]     ID: [name] OB: 05/24/1944  MR#: [id number]  MWN#:[id number]  PCP: [name], MD GYN:   SU: Stark Klein OTHER MD: Thea Silversmith, Laurence Spates, Truitt Merle  CHIEF COMPLAINT: "My mammogram showed breast cancer".  BREAST CANCER HISTORY:  Jordan Byrd underwent routine screening mammography at the breast Center 03/16/2013. The breast densities category C. In the left breast there were calcifications which call for further evaluation and on 04/13/2013 the patient underwent left unilateral mammography this confirmed a small group of pleomorphic calcifications not associated with architectural distortion. They had shown some change as compared to 2011.  Biopsy of the area in question 05/19/2013 showed ([id number]) and invasive ductal carcinoma, grade 1, estrogen receptor 100% positive, progesterone receptor 100% positive on both with strong staining intensity, with an MIB-1 of 6% and no HER-2 amplification, the signals ratio being 1.10 and a copy number per cell 2.30.  On 05/24/2013 the patient underwent bilateral breast MRI. There were only post biopsy changes noted in the left breast. There was no abnormality noted in the right breast and there are no abnormal appearing lymph nodes.  The patient's subsequent history is as detailed below   INTERVAL HISTORY: Jordan Byrd was evaluated in the multidisciplinary breast cancer clinic 06/01/2048 accompanied by her grandson Jordan Byrd. Her case was discussed in conference this morning as well.   REVIEW OF SYSTEMS: There were no specific symptoms leading to the original mammogram, which was routinely scheduled. The patient denies unusual headaches, visual changes, nausea, vomiting, stiff neck, dizziness, or gait imbalance. There has been no cough, phlegm production, or pleurisy, no chest pain or pressure, and no change in bowel or bladder habits. The patient denies fever,  rash, bleeding, unexplained fatigue or unexplained weight loss. The patient does have a variety of chronic problems including lower extremity swelling, and is very limited in her functional status. "I sit all day and keep her legs out". None of these problems however have been more persistent or acute than usual for her, and she gives me no symptoms suggestive of systemic spread of her cancer. A detailed review of systems was otherwise negative.  PAST MEDICAL HISTORY: Past Medical History  Diagnosis Date  . Asthma   . Shortness of breath   . Arthritis   . Anxiety   . Hypertension   . Atrial fibrillation     a. 04/04/11  . Osteoarthritis     a. 03/28/11 - R Total Hip Arthroplasty  . Cataracts, both eyes   . Pneumonia   . Bronchitis   . Pleurisy   . Ankle swelling   . Kidney infection   . Frequent urination   . Excessive urination at night   . Chronic headaches   . Weight gain   . CHF (congestive heart failure)     PAST SURGICAL HISTORY: Past Surgical History  Procedure Laterality Date  . Cardiac catheterization      1980's or 90's - reportedly normal  . Abdominal hysterectomy  1962  . Appendectomy  1954  . Cholecystectomy    . Back surgery  1990  . Cataract extraction bilateral w/ anterior vitrectomy  2007/2008  . Total hip arthroplasty  03/28/2011    Procedure: TOTAL HIP ARTHROPLASTY;  Surgeon: Yvette Rack., MD;  Location: [place];  Service: Orthopedics;  Laterality: Right;  . Cardioversion  04/05/2011    Procedure: CARDIOVERSION;  Surgeon: Coralyn Mark, MD;  Location: [facility]  OR;  Service: Cardiovascular;  Laterality: N/A;  . Tonsillectomy  1943  . Adenoidectomy  1949  . Foot surgery  1947  . Thoracic disc surgery  2008  . Lumbar disc surgery  2010  . Knee surgery       both knees  . Shoulder surgery      left  . Total hip arthroplasty  03/28/2011    right  . Cardioversion N/A 06/19/2012    Procedure: CARDIOVERSION-bedside(3W36);  Surgeon: Lelon Perla, MD;  Location:  [facility] OR;  Service: Cardiovascular;  Laterality: N/A;    FAMILY HISTORY Family History  Problem Relation Age of Onset  . Diabetes Father     Father, Mother, 4 sisters (2 living)  . Heart attack Father     (Deceased)  . Heart failure Father     (deceased 77)  . Hypertension Father   . Stroke Mother     (deceased 34)  . Hypertension Mother   . Cancer Sister     (deceased)  . Breast cancer Sister   . Heart attack Sister   . Diabetes Sister   The patient's father died at the age of 14 with congestive heart failure. The patient's mother died at the age of 58 following a stroke. The patient had one brother or sisters. One of her sisters was diagnosed with breast cancer at the age of 42, and died 2 years later. A niece from a different sister was diagnosed with breast cancer in her early 63s. There is no history of ovarian cancer in the family.  GYNECOLOGIC HISTORY:  Menarche age 80. The patient underwent hysterectomy and bilateral salpingo-oophorectomy in her mid 25s. She took hormone replacement for more than 30 years, stopping around age 6. She is GX P0.   SOCIAL HISTORY:  Jordan Byrd used to work in a [employer]. She is a widow, and lives alone, with no pets. She has 3 stepchildren, Jordan Byrd, who lives in [place] and is a retired Therapist, music, [place] who lives in [place] and is retired from [employer] and Jordan Byrd lives in [place] and is a retired Engineer, structural the patient's grandson Jordan Byrd was present at the time of her intake visit. He is disabled secondary to spinal problems. He used to work in Architect    ADVANCED DIRECTIVES: The patient has a living will in place but not healthcare power of attorney. She was given the appropriate documents to complete a healthcare power of attorney and she intends to name her grandson Jordan Byrd as Special educational needs teacher of attorney. He can be reached at [place]: History  Substance Use Topics  . Smoking status: Former Smoker -- 21  years    Types: Cigarettes    Quit date: 03/17/1970  . Smokeless tobacco: Former Systems developer  . Alcohol Use: No     Colonoscopy:2015   OZD:GUYQIH post hysterectomy  Bone density:2008, normal  Lipid panel:  Allergies  Allergen Reactions  . Methocarbamol Hives  . Vicodin [Hydrocodone-Acetaminophen] Other (See Comments)    Interacts with bp medication and drops blood pressure  . Aspirin Nausea And Vomiting  . Butazolidin [Phenylbutazone] Other (See Comments)    unknown  . Celebrex [Celecoxib] Nausea And Vomiting    Nausea and vomiting  . Codeine Nausea And Vomiting  . Doripenem Other (See Comments)    unknown  . Aspirin Buf(Alhyd-Mghyd-Cacar) Nausea And Vomiting  . Temazepam Other (See Comments)    unknown    Current Outpatient Prescriptions  Medication Sig Dispense Refill  . acetaminophen (  TYLENOL) 325 MG tablet Take 650 mg by mouth 2 (two) times daily as needed (temp above 102).      Marland Kitchen albuterol (PROVENTIL HFA;VENTOLIN HFA) 108 (90 BASE) MCG/ACT inhaler Inhale 2 puffs into the lungs every 6 (six) hours as needed for wheezing.      Marland Kitchen amLODipine (NORVASC) 10 MG tablet Take 10 mg by mouth daily.      . chlorzoxazone (PARAFON) 500 MG tablet Take 250 mg by mouth 3 (three) times daily as needed for muscle spasms.      . flecainide (TAMBOCOR) 100 MG tablet Take 1 tablet (100 mg total) by mouth every 12 (twelve) hours.  180 tablet  3  . furosemide (LASIX) 20 MG tablet Take 2 tablets (40 mg total) by mouth daily.  30 tablet  3  . Glucosamine-Chondroit-Vit C-Mn (GLUCOSAMINE CHONDR 1500 COMPLX PO) Take 1 tablet by mouth daily.      . IRON PO Liquid Iron - Take 5 mg twice a day      . Multiple Vitamins-Minerals (ICAPS PO) Take 1 tablet by mouth 2 (two) times daily.      . Nebivolol HCl 20 MG TABS Take 1 tablet (20 mg total) by mouth daily.  90 tablet  3  . omeprazole (PRILOSEC) 20 MG capsule Take 20 mg by mouth daily.      . Rivaroxaban (XARELTO) 15 MG TABS tablet Take 1 tablet (15 mg total)  by mouth daily.  90 tablet  3   No current facility-administered medications for this visit.    OBJECTIVE: Elderly white woman who appears stated age  20 Vitals:   06/01/13 0859  BP: 166/70  Pulse: 60  Temp: 98 F (36.7 C)  Resp: 20     Body mass index is 40.71 kg/(m^2).    ECOG FS:2 - Symptomatic, <50% confined to bed  Ocular: Sclerae unicteric, EOMs intact  Ear-nose-throat: Oropharynx clear,and moist  Lymphatic: No cervical or supraclavicular adenopathy Lungs no rales or rhonchi Heart irregular rate and rhythm, no murmur appreciated Abd soft,  obese, nontender, positive bowel sounds MSKscoliosis but  no focal spinal tenderness,  bilateral lower extremity edema, chronic  Neuro: non-focal, well-oriented, appropriate affect Breasts: The right breast is unremarkable. The left breast is status post recent biopsy. I do not palpate a well-defined mass. There are no skin or nipple changes of concern. The left axilla is benign.   LAB RESULTS:  CMP     Component Value Date/Time   NA 145 06/01/2013 0818   NA 140 11/17/2012 0934   K 3.7 06/01/2013 0818   K 3.7 11/17/2012 0934   CL 100 11/17/2012 0934   CO2 27 06/01/2013 0818   CO2 29 11/17/2012 0934   GLUCOSE 94 06/01/2013 0818   GLUCOSE 108* 11/17/2012 0934   BUN 36.1* 06/01/2013 0818   BUN 30* 11/17/2012 0934   CREATININE 1.4* 06/01/2013 0818   CREATININE 1.4* 11/17/2012 0934   CALCIUM 10.1 06/01/2013 0818   CALCIUM 9.2 11/17/2012 0934   PROT 6.9 06/01/2013 0818   PROT 6.8 07/12/2012 1524   ALBUMIN 4.0 06/01/2013 0818   ALBUMIN 3.3* 07/12/2012 1524   AST 15 06/01/2013 0818   AST 11 07/12/2012 1524   ALT 12 06/01/2013 0818   ALT 16 07/12/2012 1524   ALKPHOS 87 06/01/2013 0818   ALKPHOS 77 07/12/2012 1524   BILITOT 0.30 06/01/2013 0818   BILITOT 0.5 07/12/2012 1524   GFRNONAA 31* 09/22/2012 0500   GFRAA 36* 09/22/2012 0500  I No results found for this basename: SPEP, UPEP,  kappa and lambda light chains    Lab Results  Component  Value Date   WBC 7.7 06/01/2013   NEUTROABS 5.6 06/01/2013   HGB 13.2 06/01/2013   HCT 40.2 06/01/2013   MCV 92.1 06/01/2013   PLT 261 06/01/2013      Chemistry      Component Value Date/Time   NA 145 06/01/2013 0818   NA 140 11/17/2012 0934   K 3.7 06/01/2013 0818   K 3.7 11/17/2012 0934   CL 100 11/17/2012 0934   CO2 27 06/01/2013 0818   CO2 29 11/17/2012 0934   BUN 36.1* 06/01/2013 0818   BUN 30* 11/17/2012 0934   CREATININE 1.4* 06/01/2013 0818   CREATININE 1.4* 11/17/2012 0934      Component Value Date/Time   CALCIUM 10.1 06/01/2013 0818   CALCIUM 9.2 11/17/2012 0934   ALKPHOS 87 06/01/2013 0818   ALKPHOS 77 07/12/2012 1524   AST 15 06/01/2013 0818   AST 11 07/12/2012 1524   ALT 12 06/01/2013 0818   ALT 16 07/12/2012 1524   BILITOT 0.30 06/01/2013 0818   BILITOT 0.5 07/12/2012 1524       No results found for this basename: LABCA2    No components found with this basename: LABCA125    No results found for this basename: INR,  in the last 168 hours  Urinalysis    Component Value Date/Time   COLORURINE YELLOW 09/19/2012 [place] 09/19/2012 2302   LABSPEC 1.024 09/19/2012 2302   PHURINE 5.5 09/19/2012 2302   GLUCOSEU NEGATIVE 09/19/2012 2302   HGBUR NEGATIVE 09/19/2012 2302   BILIRUBINUR NEGATIVE 09/19/2012 2302   KETONESUR NEGATIVE 09/19/2012 2302   PROTEINUR NEGATIVE 09/19/2012 2302   UROBILINOGEN 0.2 09/19/2012 2302   NITRITE POSITIVE* 09/19/2012 2302   LEUKOCYTESUR SMALL* 09/19/2012 2302    STUDIES: Mr Breast Bilateral W Wo Contrast  05/26/2013   CLINICAL DATA:  Biopsy left upper outer quadrant calcifications reveal invasive ductal carcinoma  LABS:  BUN and creatinine were obtained on site at [facility] at  [address]  Results:  BUN 32 mg/dL,  Creatinine 1.3 mg/dL.  EXAM: BILATERAL BREAST MRI WITH AND WITHOUT CONTRAST  TECHNIQUE: Multiplanar, multisequence MR images of both breasts were obtained prior to and following the intravenous  administration of 70m of MultiHance.  THREE-DIMENSIONAL MR IMAGE RENDERING ON INDEPENDENT WORKSTATION:  Three-dimensional MR images were rendered by post-processing of the original MR data on an independent workstation. The three-dimensional MR images were interpreted, and findings are reported in the following complete MRI report for this study. Three dimensional images were evaluated at the independent DynaCad workstation  COMPARISON:  Previous exams  FINDINGS: Breast composition: c:  Heterogeneous fibroglandular tissue  Background parenchymal enhancement: Marked  Right breast: No mass or abnormal enhancement.  Left breast: No evidence of mass or abnormal enhancement. There is a marker clip from recent stereotactic biopsy identified at the middle third depth in the 2 o'clock position, with a thin tract of T2 hyperintensity and enhancement extending from the lateral dermis down to the biopsy cavity. There is no evidence of mass on MRI.  Lymph nodes: No abnormal appearing lymph nodes.  Ancillary findings: There is moderate left and mild right subareolar duct ectasia. There is a particularly dilated left subareolar duct demonstrating proteinaceous fluid contents. There is no evidence of enhancement within or around the dilated ducts, areola, or nipple on either side.  IMPRESSION: Post biopsy change corresponding to calcifications associated with invasive ductal carcinoma of the left breast. No other suspicious findings.  RECOMMENDATION: Proceed with treatment planning  BI-RADS CATEGORY  6: Known biopsy-proven malignancy.   Electronically Signed   By: Skipper Cliche M.D.   On: 05/26/2013 11:08   Mm Lt Breast Bx W Loc Dev 1st Lesion Image Bx Spec Stereo Guide  05/20/2013   ADDENDUM REPORT: 05/20/2013 08:26  ADDENDUM: Final pathology demonstrates INVASIVE DUCTAL CARCINOMA- grade 1.  Histology correlates with imaging findings.  The patient was contacted by phone on 05/20/2013 and these results given to her which she  understood. Her questions were answered.  The patient had no complaints with her biopsy site. .  Recommend surgery/ oncology consultation. An appointment at the [facility] has been scheduled for 06/01/2013 and the patient informed.  Recommend bilateral breast MRI which will be scheduled by our office and the patient notified.   Electronically Signed   By: Jordan Byrd M.D.   On: 05/20/2013 08:26   05/20/2013   CLINICAL DATA:  80 year old female for tissue sampling of suspicious calcifications in the upper outer left breast.  EXAM: LEFT STEREOTACTIC CORE NEEDLE BIOPSY  COMPARISON:  Previous exams.  FINDINGS: The patient and I discussed the procedure of stereotactic-guided biopsy including benefits and alternatives. We discussed the high likelihood of a successful procedure. We discussed the risks of the procedure including infection, bleeding, tissue injury, clip migration, and inadequate sampling. Informed written consent was given. The usual time out protocol was performed immediately prior to the procedure.  Using sterile technique and 2% Lidocaine as local anesthetic, under stereotactic guidance, a 12 gauge vacuum assisted needle device was used to perform core needle biopsy of calcifications in the upper outer left breast using a superior approach. Specimen radiograph was performed showing calcifications. Specimens with calcifications are identified for pathology.  At the conclusion of the procedure, a coil shaped tissue marker clip was deployed into the biopsy cavity. Follow-up 2-view mammogram confirmed clip clip placement to be satisfactory.  IMPRESSION: Stereotactic-guided biopsy of left breast calcifications. No apparent complications.  Pathology will be followed.  Electronically Signed: By: Jordan Byrd M.D. On: 05/19/2013 10:35    ASSESSMENT: 80 y.o. [place] woman status post left breast biopsy 05/19/2013 for a clinical T2 N0, stage IIA invasive ductal carcinoma, grade 1,  estrogen and progesterone receptor both strongly positive, with an MIB-1 of 6%, and no HER-2 amplification  PLAN: We spent the better part of today's hour-long appointment discussing the biology of breast cancer in general, and the specifics of the patient's tumor in particular. Tinika understands her tumor is slow growing and nonaggressive looking. He types out as a luminal A. In general this indicates a good prognosis and it also indicates no significant benefit from chemotherapy.  Accordingly the first in treatment in her case will be surgery. She understands that lumpectomy plus radiation is equivalent to mastectomy and lumpectomy is what we recommend. After that she will undergo radiation likely 6-1/2 weeks given the size of her breast.  She will then return to see me. At that point we will likely start anastrozole. Note that the patient had a normal bone density March of 2008. She would not be a candidate for tamoxifen given her coagulation issues.  The patient has a living will but does not have a healthcare power of attorney. She was given the appropriate documents to complete today and she intends to name her grandson as healthcare  power of attorney. She has also been scheduled for genetics. We should have the genetics result by the time she returns to see me in approximately 3 months.  In short, the plan is for surgery followed by radiation followed by aromatase inhibitors. Arlyn has a good understanding of this plan. She agrees with it. She knows to call treatment in her case is cure. She will call with any problems that may develop before next visit here.   Chauncey Cruel, MD   06/01/2013 10:17 AM

## 2013-06-03 ENCOUNTER — Telehealth: Payer: Self-pay | Admitting: Internal Medicine

## 2013-06-03 NOTE — Telephone Encounter (Signed)
New problem   Pt want to know is she can stop her Xarelto 4-5 days before breast cancer sx. Please advise pt.

## 2013-06-03 NOTE — Telephone Encounter (Signed)
Per Dr Rayann Heman only stop 24 hours to the lumpectomy.  Patient aware

## 2013-06-06 ENCOUNTER — Telehealth: Payer: Self-pay | Admitting: *Deleted

## 2013-06-06 NOTE — Telephone Encounter (Signed)
Called and spoke with patient from [facility] 06/01/13.  She is anxious to get her surgery date.  She is awaiting cardiac clearance.  Encouraged patient to call with any needs or concerns.

## 2013-06-07 ENCOUNTER — Encounter: Payer: Self-pay | Admitting: *Deleted

## 2013-06-07 NOTE — Progress Notes (Signed)
[place] Psychosocial Distress Screening Clinical Social Work  Patient completed distress screening protocol, and scored a 2 on the Psychosocial Distress Thermometer which indicates mild distress. Clinical Social Worker met with pt in [facility] to assess for distress and other psychosocial needs.  Pt stated she was doing "ok" and felt comfortable in her treatment plan.  CSW informed pt of the support team and support services at [facility].  CSW provided contact information and encouraged pt to call with any questions or concerns.     Johnnye Lana, MSW, [place] Worker [facility] [phone]

## 2013-06-10 ENCOUNTER — Encounter (INDEPENDENT_AMBULATORY_CARE_PROVIDER_SITE_OTHER): Payer: Self-pay

## 2013-06-13 ENCOUNTER — Other Ambulatory Visit (INDEPENDENT_AMBULATORY_CARE_PROVIDER_SITE_OTHER): Payer: Self-pay | Admitting: General Surgery

## 2013-06-14 ENCOUNTER — Telehealth: Payer: Self-pay | Admitting: Internal Medicine

## 2013-06-14 NOTE — Telephone Encounter (Signed)
Samples out front.  Patient aware

## 2013-06-14 NOTE — Telephone Encounter (Signed)
Needs samples of Bystolic.  She takes 20mg  daily (2 10mg  tablets)

## 2013-06-14 NOTE — Telephone Encounter (Signed)
New problem   Pt need to talk to you concerning some of her medications.

## 2013-06-15 ENCOUNTER — Other Ambulatory Visit (INDEPENDENT_AMBULATORY_CARE_PROVIDER_SITE_OTHER): Payer: Self-pay | Admitting: General Surgery

## 2013-06-15 ENCOUNTER — Telehealth (INDEPENDENT_AMBULATORY_CARE_PROVIDER_SITE_OTHER): Payer: Self-pay

## 2013-06-15 DIAGNOSIS — C50412 Malignant neoplasm of upper-outer quadrant of left female breast: Secondary | ICD-10-CM

## 2013-06-15 NOTE — Telephone Encounter (Signed)
Pt called asking about her needing to stop her blood thinner for her seed placement. Her cardiologist only wants her off blood thinner for 3 days. Her seed placement is scheduled 3 days prior to surgery. She would not be off blood thinner prior to seed being placed. Called breast center and spoke to [place] who states she does need to be off blood thinner prior to seed placement. Gave info to [place] in scheduling to see if seed can be rescheduled so pt can be off blood thinner to accommodate surgery and seed placement.

## 2013-06-16 ENCOUNTER — Telehealth: Payer: Self-pay | Admitting: Internal Medicine

## 2013-06-16 NOTE — Telephone Encounter (Signed)
She is going to hold for 3 days and restart the day of surgery which is the 11th

## 2013-06-16 NOTE — Telephone Encounter (Signed)
Information'    Pt called to report that she will be off her XARELTO for 4 days and not 3days.  Pt wants to know if that is OK?  Please give pt a call.

## 2013-07-06 ENCOUNTER — Other Ambulatory Visit: Payer: Medicare Other

## 2013-07-06 ENCOUNTER — Ambulatory Visit (HOSPITAL_BASED_OUTPATIENT_CLINIC_OR_DEPARTMENT_OTHER): Payer: Medicare Other | Admitting: Genetic Counselor

## 2013-07-06 DIAGNOSIS — Z803 Family history of malignant neoplasm of breast: Secondary | ICD-10-CM | POA: Insufficient documentation

## 2013-07-06 DIAGNOSIS — C50919 Malignant neoplasm of unspecified site of unspecified female breast: Secondary | ICD-10-CM | POA: Insufficient documentation

## 2013-07-06 DIAGNOSIS — Z170 Estrogen receptor positive status [ER+]: Secondary | ICD-10-CM

## 2013-07-06 DIAGNOSIS — C50419 Malignant neoplasm of upper-outer quadrant of unspecified female breast: Secondary | ICD-10-CM

## 2013-07-06 NOTE — Progress Notes (Signed)
HISTORY OF PRESENT ILLNESS: Dr. [name] requested a cancer genetics consultation for Jordan Byrd, a 80 y.o. female, due to a personal and family history of breast cancer.  Jordan Byrd presents to clinic today to discuss the possibility of a hereditary predisposition to cancer, genetic testing, and to further clarify her future cancer risks, as well as potential cancer risk for family members.  Jordan Byrd was diagnosed with left, IDC breast cancer at the age of 48.  The breast tumor is ER positive, PR positive, Her2Neu negative. She is planning lumpectomy followed by radiation. Jordan Byrd has no personal history of other types of cancer. She had a TAH-BSO at age 80 due to non-cancerous reasons.  Past Medical History  Diagnosis Date   Asthma    Shortness of breath    Arthritis    Anxiety    Hypertension    Atrial fibrillation     a. 04/04/11   Osteoarthritis     a. 03/28/11 - R Total Hip Arthroplasty   Cataracts, both eyes    Pneumonia    Bronchitis    Pleurisy    Ankle swelling    Kidney infection    Frequent urination    Excessive urination at night    Chronic headaches    Weight gain    CHF (congestive heart failure)     Past Surgical History  Procedure Laterality Date   Cardiac catheterization      1980's or 90's - reportedly normal   Abdominal hysterectomy  1962   Appendectomy  1954   Cholecystectomy     Back surgery  1990   Cataract extraction bilateral w/ anterior vitrectomy  2007/2008   Total hip arthroplasty  03/28/2011    Procedure: TOTAL HIP ARTHROPLASTY;  Surgeon: Yvette Rack., MD;  Location: [facility];  Service: Orthopedics;  Laterality: Right;   Cardioversion  04/05/2011    Procedure: CARDIOVERSION;  Surgeon: Coralyn Mark, MD;  Location: [facility] OR;  Service: Cardiovascular;  Laterality: N/A;   Tonsillectomy  1943   Adenoidectomy  1949   Foot surgery  1947   Thoracic disc surgery  2008   Lumbar disc surgery  2010   Knee surgery       both  knees   Shoulder surgery      left   Total hip arthroplasty  03/28/2011    right   Cardioversion N/A 06/19/2012    Procedure: CARDIOVERSION-bedside(3W36);  Surgeon: Lelon Perla, MD;  Location: [facility] OR;  Service: Cardiovascular;  Laterality: N/A;   History   Social History   Marital Status: Widowed    Spouse Name: N/A    Number of Children: N/A   Years of Education: N/A   Occupational History   Retired    Social History Main Topics   Smoking status: Former Smoker -- 21 years    Types: Cigarettes    Quit date: 03/17/1970   Smokeless tobacco: Former Systems developer   Alcohol Use: No   Drug Use: No   Sexual Activity: Not Currently   Other Topics Concern   Not on file   Social History Narrative   Lives alone in [place].  Widowed in 2012 (husband w/ dementia & parkinsons)..   Not active, does not drive due to vision problems.     FAMILY HISTORY:  During the visit, a 4-generation pedigree was obtained. Significant diagnoses include the following:  Family History  Problem Relation Age of Onset   Diabetes Father     Father,  Mother, 4 sisters (2 living)   Heart attack Father     (Deceased)   Heart failure Father     (deceased 5)   Hypertension Father    Stroke Mother     (deceased 97)   Hypertension Mother    Cancer Sister 54    breast cancer   Heart attack Sister    Diabetes Sister    Cancer Other 38    niece with breast cancer    Jordan Byrd ancestry is of Caucasian descent. There is no known Jewish ancestry or consanguinity.  GENETIC COUNSELING ASSESSMENT: Jordan Byrd is a 80 y.o. female with a personal and family history of cancer suggestive of a hereditary predisposition to cancer. We, therefore, discussed and recommended the following at today's visit.   DISCUSSION: We reviewed the characteristics, features and inheritance patterns of hereditary cancer syndromes. We also discussed genetic testing, including the appropriate family members to test,  the process of testing, insurance coverage and turn-around-time for results. We discussed the implications of a negative, positive and/or variant of uncertain significant result. We recommended Jordan Byrd pursue genetic testing for the BreastNext gene panel.   PLAN: Based on our above recommendation, Jordan Byrd wished to pursue genetic testing and the blood sample was drawn and will be sent to [facility] for analysis. Results should be available within approximately 6 weeks time, at which point they will be disclosed by telephone to Jordan Byrd, as will any additional recommendations warranted by these results. We also encouraged Jordan Byrd to remain in contact with cancer genetics annually so that we can continuously update the family history and inform her of any changes in cancer genetics and testing that may be of benefit for this family. Ms.  Byrd questions were answered to her satisfaction today. Our contact information was provided should additional questions or concerns arise.   Thank you for the referral and allowing [place] to share in the care of your patient.   The patient was seen for a total of 45 minutes in face-to-face genetic counseling.  This patient was discussed with Magrinat who agrees with the above.    _______________________________________________________________________ For Office Staff:  Number of people involved in session: 3 Was an Intern/ student involved with case: no

## 2013-07-08 ENCOUNTER — Telehealth: Payer: Self-pay | Admitting: Nurse Practitioner

## 2013-07-08 ENCOUNTER — Encounter (HOSPITAL_BASED_OUTPATIENT_CLINIC_OR_DEPARTMENT_OTHER): Payer: Self-pay | Admitting: *Deleted

## 2013-07-08 NOTE — Telephone Encounter (Signed)
New Prob   Pt is having lumpectomy of 6/11 by Dr. Barry Dienes who has requested her to stay overnight for her heart. Calling to see if pt needs telemetry. Please call back.

## 2013-07-08 NOTE — Telephone Encounter (Signed)
Lorraine from Day surgery calling wanting to know if pt needed telemetry.  Per Dr. Rayann Heman if she has general anesth will need telemetry.

## 2013-07-08 NOTE — Progress Notes (Signed)
Pt has htn,chf,asthma,copd-3 hospital adm 2014 for chf-sob-was on 02 at night -not now-cardiac clearance given,dr Barry Dienes has pt staying overnight post lumpectomy  for her heart per pt.-we do not do telemetry here-pt seeing Truitt Merle NP cardiology Monday am-left message with Cecille Rubin if pt needs telemetry post op-pt will need to have surgery at main OR-not [facility]. Pt will need bmet preop

## 2013-07-08 NOTE — Progress Notes (Signed)
07/08/13 1138  OBSTRUCTIVE SLEEP APNEA  Have you ever been diagnosed with sleep apnea through a sleep study? No  Do you snore loudly (loud enough to be heard through closed doors)?  0  Do you often feel tired, fatigued, or sleepy during the daytime? 0  Has anyone observed you stop breathing during your sleep? 0  Do you have, or are you being treated for high blood pressure? 1  BMI more than 35 kg/m2? 1  Age over 78 years old? 1  Neck circumference greater than 40 cm/16 inches? 1  Gender: 0  Obstructive Sleep Apnea Score 4  Score 4 or greater  Results sent to PCP

## 2013-07-11 ENCOUNTER — Encounter: Payer: Self-pay | Admitting: Nurse Practitioner

## 2013-07-11 ENCOUNTER — Ambulatory Visit (INDEPENDENT_AMBULATORY_CARE_PROVIDER_SITE_OTHER): Payer: Medicare Other | Admitting: Nurse Practitioner

## 2013-07-11 VITALS — BP 110/80 | HR 55 | Ht 66.0 in | Wt 240.1 lb

## 2013-07-11 DIAGNOSIS — Z7901 Long term (current) use of anticoagulants: Secondary | ICD-10-CM

## 2013-07-11 DIAGNOSIS — I10 Essential (primary) hypertension: Secondary | ICD-10-CM

## 2013-07-11 DIAGNOSIS — I4891 Unspecified atrial fibrillation: Secondary | ICD-10-CM

## 2013-07-11 DIAGNOSIS — Z01818 Encounter for other preprocedural examination: Secondary | ICD-10-CM

## 2013-07-11 DIAGNOSIS — I480 Paroxysmal atrial fibrillation: Secondary | ICD-10-CM

## 2013-07-11 DIAGNOSIS — I5032 Chronic diastolic (congestive) heart failure: Secondary | ICD-10-CM

## 2013-07-11 NOTE — Patient Instructions (Signed)
I think you are doing well  Good luck with your surgery later this week  Stay on same medicines  See Dr. Rayann Heman in September  Call the [place] office at [phone] if you have any questions, problems or concerns.

## 2013-07-11 NOTE — Progress Notes (Signed)
[name] Date of Birth: 05/22/1933 Medical Record #[id number]  History of Present Illness: [name] is seen back today for a 4 month visit. Seen for Dr. Rayann Heman. She has PAF, HTN, CKD and diastolic heart failure. No know CAD. Has had prior cardioversions for her atrial fib and in the past had been "highly symptomatic". Remains on flecainide. Now on Xarelto for anticoagulation.   I saw her in early June of 2014. She was doing ok. Was back in atrial fib and did NOT seem to be aware nor symptomatic.   Admitted last June of 2014 with acute on chronic diastolic HF. Echo was updated. Normal EF. She was diuresed and had worsening renal function. Was in sinus rhythm during that admission. Did have worsening renal function - referred to nephrology. Now off of her ACE and on lower dose of diuretic. Sees Dr. Justin Mend for renal.  Seen by Dr. Rayann Heman at the end of September last year - for exertional SOB and chest tightness - apparently she had not stopped her ACE nor changed her dose of Lasix. Xarelto was cut back due to her CKD. Her HF was felt to be limited by her renal function. Lexiscan was ordered - this turned out to be low risk. She was in sinus rhythm at her last visit in September.   I last saw her in October of 2014 - was doing ok. Did tell me she was not going to go back and see Renal nor have IV iron.   Saw Dr. Rayann Heman in February of this year - was doing ok. Did end up getting a colonoscopy - found to have polys - on iron by PCP. Has since been diagnosed with breast cancer -   Comes back today. Here with her grandson. Says she is feeling "wonderful". Doing ok. No chest pain. Breathing ok does get short winded if she does too much. On higher dose of Lasix now by PCP. For surgery later this week - says someone is to be calling me about where to have her surgery - sounds like the surgeon wants her done in the main OR at [facility] due to her multiple issues. Rhythm ok she thinks. Not dizzy or  lightheaded. Has had recent labs.    Current Outpatient Prescriptions  Medication Sig Dispense Refill  . acetaminophen (TYLENOL) 325 MG tablet Take 650 mg by mouth every 6 (six) hours as needed (temp above 102).       Marland Kitchen albuterol (PROVENTIL HFA;VENTOLIN HFA) 108 (90 BASE) MCG/ACT inhaler Inhale 1 puff into the lungs every 6 (six) hours as needed for wheezing.       Marland Kitchen amLODipine (NORVASC) 10 MG tablet Take 10 mg by mouth daily.      . budesonide-formoterol (SYMBICORT) 160-4.5 MCG/ACT inhaler Inhale 2 puffs into the lungs 2 (two) times daily.      . chlorzoxazone (PARAFON) 500 MG tablet Take 250 mg by mouth 3 (three) times daily as needed for muscle spasms.      . flecainide (TAMBOCOR) 100 MG tablet Take 1 tablet (100 mg total) by mouth every 12 (twelve) hours.  180 tablet  3  . furosemide (LASIX) 20 MG tablet Take 60 mg by mouth daily.      . Glucosamine-Chondroit-Vit C-Mn (GLUCOSAMINE CHONDR 1500 COMPLX PO) Take 1 tablet by mouth daily.      . IRON PO Liquid Iron - Take 5 mg twice a day      . Multiple Vitamins-Minerals (ICAPS PO) Take 1  tablet by mouth 2 (two) times daily.      . nebivolol (BYSTOLIC) 10 MG tablet Take 20 mg by mouth daily.      Marland Kitchen omeprazole (PRILOSEC) 20 MG capsule Take 20 mg by mouth daily.      . Rivaroxaban (XARELTO) 15 MG TABS tablet Take 1 tablet (15 mg total) by mouth daily.  90 tablet  3   No current facility-administered medications for this visit.    Allergies  Allergen Reactions  . Methocarbamol Hives  . Vicodin [Hydrocodone-Acetaminophen] Other (See Comments)    Interacts with bp medication and drops blood pressure  . Aspirin Nausea And Vomiting  . Butazolidin [Phenylbutazone] Other (See Comments)    unknown  . Celebrex [Celecoxib] Nausea And Vomiting    Nausea and vomiting  . Codeine Nausea And Vomiting  . Doripenem Other (See Comments)    unknown  . Aspirin Buf(Alhyd-Mghyd-Cacar) Nausea And Vomiting  . Temazepam Other (See Comments)    unknown     Past Medical History  Diagnosis Date  . Asthma   . Arthritis   . Anxiety   . Hypertension   . Atrial fibrillation     a. 04/04/11  . Osteoarthritis     a. 03/28/11 - R Total Hip Arthroplasty  . Cataracts, both eyes   . Pneumonia   . Bronchitis   . Pleurisy   . Ankle swelling   . Kidney infection   . Frequent urination   . Excessive urination at night   . Chronic headaches   . Weight gain   . CHF (congestive heart failure)   . Shortness of breath     doe-  . Anemia     Past Surgical History  Procedure Laterality Date  . Cardiac catheterization      1980's or 90's - reportedly normal  . Abdominal hysterectomy  1962  . Appendectomy  1954  . Cholecystectomy    . Back surgery  1990  . Cataract extraction bilateral w/ anterior vitrectomy  2007/2008  . Total hip arthroplasty  03/28/2011    Procedure: TOTAL HIP ARTHROPLASTY;  Surgeon: Yvette Rack., MD;  Location: [facility];  Service: Orthopedics;  Laterality: Right;  . Cardioversion  04/05/2011    Procedure: CARDIOVERSION;  Surgeon: Coralyn Mark, MD;  Location: [facility];  Service: Cardiovascular;  Laterality: N/A;  . Tonsillectomy  1943  . Adenoidectomy  1949  . Foot surgery  1947  . Thoracic disc surgery  2008  . Lumbar disc surgery  2010  . Knee surgery       both knees  . Shoulder surgery      left  . Total hip arthroplasty  03/28/2011    right  . Cardioversion N/A 06/19/2012    Procedure: CARDIOVERSION-bedside(3W36);  Surgeon: Lelon Perla, MD;  Location: [facility] OR;  Service: Cardiovascular;  Laterality: N/A;  . Colonoscopy      History  Smoking status  . Former Smoker -- 21 years  . Types: Cigarettes  . Quit date: 03/17/1970  Smokeless tobacco  . Former User    History  Alcohol Use No    Family History  Problem Relation Age of Onset  . Diabetes Father     Father, Mother, 4 sisters (2 living)  . Heart attack Father     (Deceased)  . Heart failure Father     (deceased 8)  . Hypertension Father   .  Stroke Mother     (deceased 73)  . Hypertension  Mother   . Cancer Sister 51    breast cancer  . Heart attack Sister   . Diabetes Sister   . Cancer Other 64    niece with breast cancer    Review of Systems: The review of systems is per the HPI.  All other systems were reviewed and are negative.  Physical Exam: BP 110/80  Pulse 55  Ht 5\' 6"  (1.676 m)  Wt 240 lb 1.9 oz (108.918 kg)  BMI 38.77 kg/m2  SpO2 93% Patient is very pleasant and in no acute distress. Skin is warm and dry. Color is normal.  HEENT is unremarkable. Normocephalic/atraumatic. PERRL. Sclera are nonicteric. Neck is supple. No masses. No JVD. Lungs are clear. Cardiac exam shows a regular rate and rhythm. Abdomen is soft. Extremities are without edema. Gait and ROM are intact. No gross neurologic deficits noted.  Lab Results  Component Value Date   WBC 7.7 06/01/2013   HGB 13.2 06/01/2013   HCT 40.2 06/01/2013   PLT 261 06/01/2013   GLUCOSE 94 06/01/2013   CHOL 204* 04/05/2011   TRIG 211* 04/05/2011   HDL 26* 04/05/2011   LDLCALC 136* 04/05/2011   ALT 12 06/01/2013   AST 15 06/01/2013   NA 145 06/01/2013   K 3.7 06/01/2013   CL 100 11/17/2012   CREATININE 1.4* 06/01/2013   BUN 36.1* 06/01/2013   CO2 27 06/01/2013   TSH 1.307 09/19/2012   INR 1.07 04/04/2011    Wt Readings from Last 3 Encounters:  07/11/13 240 lb 1.9 oz (108.918 kg)  06/01/13 240 lb 12.8 oz (109.226 kg)  03/07/13 235 lb 10.4 oz (106.89 kg)     LABORATORY DATA: Lab Results  Component Value Date   WBC 7.7 06/01/2013   HGB 13.2 06/01/2013   HCT 40.2 06/01/2013   PLT 261 06/01/2013   GLUCOSE 94 06/01/2013   CHOL 204* 04/05/2011   TRIG 211* 04/05/2011   HDL 26* 04/05/2011   LDLCALC 136* 04/05/2011   ALT 12 06/01/2013   AST 15 06/01/2013   NA 145 06/01/2013   K 3.7 06/01/2013   CL 100 11/17/2012   CREATININE 1.4* 06/01/2013   BUN 36.1* 06/01/2013   CO2 27 06/01/2013   TSH 1.307 09/19/2012   INR 1.07 04/04/2011     MYOVIEW FROM October 2014  Overall Impression:  This is a low risk scan. There is a moderate size area of decreased activity in the midanterior wall to the apex. This is fixed. Most likely this is artifact related to breast attenuation. There is no definite ischemia.  LV Ejection Fraction: 59%. LV Wall Motion: Normal Wall Motion  Dola Argyle, MD   ECHO Study Conclusions from June 2014  - Left ventricle: The cavity size was mildly dilated. Wall thickness was normal. Systolic function was normal. The estimated ejection fraction was in the range of 55% to 60%. - Mitral valve: Mild regurgitation. - Left atrium: The atrium was mildly dilated.    Assessment / Plan:  1. Paroxysmal atrial fib - on Xarelto - remains on Flecainide as well - in sinus on physical exam today and by EKG today. I have left her on her current regimen.   2. Chronic diastolic HF - seems compensated at this time.   3. Chest pain - no known CAD - low risk Myoview last year.   4. Progressive CKD   5. Breast cancer - for surgery later this week - holding her Xarelto 3 days prior. Would agree with having  her surgery in the main OR - we will be available as needed - she is felt to be stable from our standpoint for her surgery.  See her back in September a planned.   Patient is agreeable to this plan and will call if any problems develop in the interim.   Burtis Junes, RN, [place]  [address] [place]  [place], [place] [address]

## 2013-07-12 ENCOUNTER — Encounter (HOSPITAL_COMMUNITY): Payer: Self-pay | Admitting: Pharmacy Technician

## 2013-07-13 ENCOUNTER — Encounter (HOSPITAL_BASED_OUTPATIENT_CLINIC_OR_DEPARTMENT_OTHER): Payer: Self-pay | Admitting: *Deleted

## 2013-07-13 MED ORDER — CIPROFLOXACIN IN D5W 400 MG/200ML IV SOLN
400.0000 mg | INTRAVENOUS | Status: AC
  Administered 2013-07-14: 400 mg via INTRAVENOUS
  Filled 2013-07-13: qty 200

## 2013-07-13 NOTE — Progress Notes (Signed)
Cardiac clearance in EPIC. Pt stopped Xarelto prior to surgery per order of L. Servando Snare, NP. Last dose was Sunday, 07/10/13.

## 2013-07-13 NOTE — Progress Notes (Signed)
Pt going to [place] prior to arrival tomorrow morning. Pt aware that she needs to come directly to the Admitting office after leaving the [place].

## 2013-07-14 ENCOUNTER — Ambulatory Visit (HOSPITAL_COMMUNITY): Payer: Medicare Other

## 2013-07-14 ENCOUNTER — Ambulatory Visit (HOSPITAL_COMMUNITY): Payer: Medicare Other | Admitting: Certified Registered"

## 2013-07-14 ENCOUNTER — Ambulatory Visit
Admission: RE | Admit: 2013-07-14 | Discharge: 2013-07-14 | Disposition: A | Discharge status: Home / Self Care | Payer: Medicare Other | Source: Ambulatory Visit | Attending: General Surgery | Admitting: General Surgery

## 2013-07-14 ENCOUNTER — Encounter (HOSPITAL_COMMUNITY)
Admission: RE | Disposition: A | Discharge status: Home / Self Care | Payer: Self-pay | Source: Ambulatory Visit | Attending: General Surgery

## 2013-07-14 ENCOUNTER — Ambulatory Visit (HOSPITAL_BASED_OUTPATIENT_CLINIC_OR_DEPARTMENT_OTHER)
Admission: RE | Admit: 2013-07-14 | Discharge: 2013-07-14 | Disposition: A | Discharge status: Home / Self Care | Payer: Medicare Other | Source: Ambulatory Visit | Attending: General Surgery | Admitting: General Surgery

## 2013-07-14 ENCOUNTER — Encounter (HOSPITAL_COMMUNITY): Payer: Self-pay | Admitting: *Deleted

## 2013-07-14 ENCOUNTER — Encounter (HOSPITAL_COMMUNITY): Payer: Medicare Other | Admitting: Certified Registered"

## 2013-07-14 DIAGNOSIS — Z886 Allergy status to analgesic agent status: Secondary | ICD-10-CM | POA: Insufficient documentation

## 2013-07-14 DIAGNOSIS — Z885 Allergy status to narcotic agent status: Secondary | ICD-10-CM | POA: Insufficient documentation

## 2013-07-14 DIAGNOSIS — J45909 Unspecified asthma, uncomplicated: Secondary | ICD-10-CM | POA: Insufficient documentation

## 2013-07-14 DIAGNOSIS — C50412 Malignant neoplasm of upper-outer quadrant of left female breast: Secondary | ICD-10-CM

## 2013-07-14 DIAGNOSIS — Z79899 Other long term (current) drug therapy: Secondary | ICD-10-CM | POA: Insufficient documentation

## 2013-07-14 DIAGNOSIS — Z87891 Personal history of nicotine dependence: Secondary | ICD-10-CM | POA: Insufficient documentation

## 2013-07-14 DIAGNOSIS — H269 Unspecified cataract: Secondary | ICD-10-CM | POA: Insufficient documentation

## 2013-07-14 DIAGNOSIS — K219 Gastro-esophageal reflux disease without esophagitis: Secondary | ICD-10-CM | POA: Insufficient documentation

## 2013-07-14 DIAGNOSIS — M1990 Unspecified osteoarthritis, unspecified site: Secondary | ICD-10-CM | POA: Insufficient documentation

## 2013-07-14 DIAGNOSIS — Z888 Allergy status to other drugs, medicaments and biological substances status: Secondary | ICD-10-CM | POA: Insufficient documentation

## 2013-07-14 DIAGNOSIS — I509 Heart failure, unspecified: Secondary | ICD-10-CM | POA: Insufficient documentation

## 2013-07-14 DIAGNOSIS — C50919 Malignant neoplasm of unspecified site of unspecified female breast: Secondary | ICD-10-CM | POA: Insufficient documentation

## 2013-07-14 DIAGNOSIS — F411 Generalized anxiety disorder: Secondary | ICD-10-CM | POA: Insufficient documentation

## 2013-07-14 DIAGNOSIS — I10 Essential (primary) hypertension: Secondary | ICD-10-CM | POA: Insufficient documentation

## 2013-07-14 DIAGNOSIS — D0590 Unspecified type of carcinoma in situ of unspecified breast: Secondary | ICD-10-CM

## 2013-07-14 DIAGNOSIS — I4891 Unspecified atrial fibrillation: Secondary | ICD-10-CM | POA: Insufficient documentation

## 2013-07-14 HISTORY — DX: Gastro-esophageal reflux disease without esophagitis: K21.9

## 2013-07-14 HISTORY — PX: BREAST LUMPECTOMY WITH NEEDLE LOCALIZATION: SHX5759

## 2013-07-14 HISTORY — DX: Anemia, unspecified: D64.9

## 2013-07-14 HISTORY — DX: Unspecified macular degeneration: H35.30

## 2013-07-14 LAB — BASIC METABOLIC PANEL
BUN: 29 mg/dL — AB (ref 6–23)
CO2: 26 mEq/L (ref 19–32)
Calcium: 9.4 mg/dL (ref 8.4–10.5)
Chloride: 102 mEq/L (ref 96–112)
Creatinine, Ser: 1.25 mg/dL — ABNORMAL HIGH (ref 0.50–1.10)
GFR calc Af Amer: 47 mL/min — ABNORMAL LOW (ref >90)
GFR calc non Af Amer: 40 mL/min — ABNORMAL LOW (ref >90)
Glucose, Bld: 105 mg/dL — ABNORMAL HIGH (ref 70–99)
Potassium: 4.3 mEq/L (ref 3.7–5.3)
SODIUM: 143 meq/L (ref 137–147)

## 2013-07-14 LAB — CBC
HEMATOCRIT: 42.2 % (ref 36.0–46.0)
Hemoglobin: 13.3 g/dL (ref 12.0–15.0)
MCH: 29.4 pg (ref 26.0–34.0)
MCHC: 31.5 g/dL (ref 30.0–36.0)
MCV: 93.2 fL (ref 78.0–100.0)
Platelets: 255 10*3/uL (ref 150–400)
RBC: 4.53 MIL/uL (ref 3.87–5.11)
RDW: 13.9 % (ref 11.5–15.5)
WBC: 8.2 10*3/uL (ref 4.0–10.5)

## 2013-07-14 SURGERY — RADIOACTIVE SEED GUIDED BREAST BIOPSY
Anesthesia: General | Site: Breast | Laterality: Left

## 2013-07-14 SURGERY — BREAST LUMPECTOMY WITH NEEDLE LOCALIZATION
Anesthesia: General | Site: Breast | Laterality: Left

## 2013-07-14 MED ORDER — LACTATED RINGERS IV SOLN
INTRAVENOUS | Status: DC
  Administered 2013-07-14: 09:00:00 via INTRAVENOUS

## 2013-07-14 MED ORDER — PROPOFOL 10 MG/ML IV BOLUS
INTRAVENOUS | Status: AC
  Filled 2013-07-14: qty 20

## 2013-07-14 MED ORDER — FENTANYL CITRATE 0.05 MG/ML IJ SOLN
INTRAMUSCULAR | Status: DC | PRN
  Administered 2013-07-14: 25 ug via INTRAVENOUS
  Administered 2013-07-14: 75 ug via INTRAVENOUS

## 2013-07-14 MED ORDER — SUCCINYLCHOLINE CHLORIDE 20 MG/ML IJ SOLN
INTRAMUSCULAR | Status: DC | PRN
  Administered 2013-07-14: 100 mg via INTRAVENOUS

## 2013-07-14 MED ORDER — MIDAZOLAM HCL 2 MG/2ML IJ SOLN
INTRAMUSCULAR | Status: AC
  Filled 2013-07-14: qty 2

## 2013-07-14 MED ORDER — TRAMADOL HCL 50 MG PO TABS: 50.0000 mg | ORAL_TABLET | Freq: Four times a day (QID) | ORAL | Status: DC | PRN

## 2013-07-14 MED ORDER — HYDROMORPHONE HCL PF 1 MG/ML IJ SOLN: 0.2500 mg | INTRAMUSCULAR | Status: DC | PRN

## 2013-07-14 MED ORDER — MIDAZOLAM HCL 5 MG/5ML IJ SOLN
INTRAMUSCULAR | Status: DC | PRN
  Administered 2013-07-14: 1 mg via INTRAVENOUS

## 2013-07-14 MED ORDER — EPHEDRINE SULFATE 50 MG/ML IJ SOLN
INTRAMUSCULAR | Status: DC | PRN
  Administered 2013-07-14 (×2): 10 mg via INTRAVENOUS

## 2013-07-14 MED ORDER — GLYCOPYRROLATE 0.2 MG/ML IJ SOLN
INTRAMUSCULAR | Status: DC | PRN
  Administered 2013-07-14: 0.2 mg via INTRAVENOUS

## 2013-07-14 MED ORDER — LIDOCAINE HCL (CARDIAC) 20 MG/ML IV SOLN
INTRAVENOUS | Status: DC | PRN
  Administered 2013-07-14: 80 mg via INTRAVENOUS

## 2013-07-14 MED ORDER — FENTANYL CITRATE 0.05 MG/ML IJ SOLN
INTRAMUSCULAR | Status: AC
  Filled 2013-07-14: qty 5

## 2013-07-14 MED ORDER — PHENYLEPHRINE HCL 10 MG/ML IJ SOLN
INTRAMUSCULAR | Status: DC | PRN
  Administered 2013-07-14: 80 ug via INTRAVENOUS

## 2013-07-14 MED ORDER — ONDANSETRON HCL 4 MG/2ML IJ SOLN
INTRAMUSCULAR | Status: DC | PRN
  Administered 2013-07-14: 4 mg via INTRAVENOUS

## 2013-07-14 MED ORDER — PROPOFOL 10 MG/ML IV BOLUS
INTRAVENOUS | Status: DC | PRN
  Administered 2013-07-14: 170 mg via INTRAVENOUS
  Administered 2013-07-14: 30 mg via INTRAVENOUS

## 2013-07-14 MED ORDER — 0.9 % SODIUM CHLORIDE (POUR BTL) OPTIME
TOPICAL | Status: DC | PRN
  Administered 2013-07-14 (×2): 1000 mL

## 2013-07-14 MED ORDER — OXYCODONE HCL 5 MG PO TABS: 5.0000 mg | ORAL_TABLET | ORAL | Status: DC | PRN

## 2013-07-14 MED ORDER — PROMETHAZINE HCL 25 MG/ML IJ SOLN: 6.2500 mg | INTRAMUSCULAR | Status: DC | PRN

## 2013-07-14 MED ORDER — LIDOCAINE HCL 1 % IJ SOLN
INTRAMUSCULAR | Status: DC | PRN
  Administered 2013-07-14: 12:00:00

## 2013-07-14 SURGICAL SUPPLY — 54 items
BINDER BREAST LRG (GAUZE/BANDAGES/DRESSINGS) IMPLANT
BINDER BREAST XLRG (GAUZE/BANDAGES/DRESSINGS) IMPLANT
BINDER BREAST XXLRG (GAUZE/BANDAGES/DRESSINGS) ×3 IMPLANT
BLADE SURG 10 STRL SS (BLADE) ×3 IMPLANT
BLADE SURG 15 STRL LF DISP TIS (BLADE) ×1 IMPLANT
BLADE SURG 15 STRL SS (BLADE) ×2
CANISTER SUCTION 2500CC (MISCELLANEOUS) ×3 IMPLANT
CHLORAPREP W/TINT 26ML (MISCELLANEOUS) ×3 IMPLANT
CLIP TI LARGE 6 (CLIP) ×3 IMPLANT
CLOSURE WOUND 1/2 X4 (GAUZE/BANDAGES/DRESSINGS) ×1
CONT SPEC 4OZ CLIKSEAL STRL BL (MISCELLANEOUS) ×6 IMPLANT
COVER SURGICAL LIGHT HANDLE (MISCELLANEOUS) ×3 IMPLANT
DEVICE DUBIN SPECIMEN MAMMOGRA (MISCELLANEOUS) ×3 IMPLANT
DRAPE CHEST BREAST 15X10 FENES (DRAPES) ×3 IMPLANT
DRAPE SURG 17X23 STRL (DRAPES) ×3 IMPLANT
DRAPE UTILITY 15X26 W/TAPE STR (DRAPE) ×6 IMPLANT
ELECT BLADE 4.0 EZ CLEAN MEGAD (MISCELLANEOUS) ×3
ELECT CAUTERY BLADE 6.4 (BLADE) ×3 IMPLANT
ELECT REM PT RETURN 9FT ADLT (ELECTROSURGICAL) ×3
ELECTRODE BLDE 4.0 EZ CLN MEGD (MISCELLANEOUS) ×1 IMPLANT
ELECTRODE REM PT RTRN 9FT ADLT (ELECTROSURGICAL) ×1 IMPLANT
GLOVE BIO SURGEON STRL SZ 6 (GLOVE) ×3 IMPLANT
GLOVE BIO SURGEON STRL SZ7 (GLOVE) ×6 IMPLANT
GLOVE BIOGEL PI IND STRL 6.5 (GLOVE) ×1 IMPLANT
GLOVE BIOGEL PI IND STRL 7.0 (GLOVE) ×4 IMPLANT
GLOVE BIOGEL PI INDICATOR 6.5 (GLOVE) ×2
GLOVE BIOGEL PI INDICATOR 7.0 (GLOVE) ×8
GLOVE ECLIPSE 7.5 STRL STRAW (GLOVE) ×6 IMPLANT
GOWN STRL REUS W/ TWL LRG LVL3 (GOWN DISPOSABLE) ×3 IMPLANT
GOWN STRL REUS W/TWL 2XL LVL3 (GOWN DISPOSABLE) ×3 IMPLANT
GOWN STRL REUS W/TWL LRG LVL3 (GOWN DISPOSABLE) ×6
KIT BASIN OR (CUSTOM PROCEDURE TRAY) ×3 IMPLANT
KIT MARKER MARGIN INK (KITS) ×3 IMPLANT
KIT ROOM TURNOVER OR (KITS) ×3 IMPLANT
NEEDLE HYPO 25GX1X1/2 BEV (NEEDLE) ×3 IMPLANT
NS IRRIG 1000ML POUR BTL (IV SOLUTION) ×6 IMPLANT
PACK SURGICAL SETUP 50X90 (CUSTOM PROCEDURE TRAY) ×3 IMPLANT
PAD ARMBOARD 7.5X6 YLW CONV (MISCELLANEOUS) ×3 IMPLANT
PENCIL BUTTON HOLSTER BLD 10FT (ELECTRODE) ×3 IMPLANT
SPONGE GAUZE 4X4 12PLY (GAUZE/BANDAGES/DRESSINGS) ×3 IMPLANT
SPONGE LAP 18X18 X RAY DECT (DISPOSABLE) ×6 IMPLANT
STRIP CLOSURE SKIN 1/2X4 (GAUZE/BANDAGES/DRESSINGS) ×2 IMPLANT
SUT MNCRL AB 4-0 PS2 18 (SUTURE) ×3 IMPLANT
SUT SILK 2 0 SH (SUTURE) ×3 IMPLANT
SUT VIC AB 2-0 SH 27 (SUTURE) ×2
SUT VIC AB 2-0 SH 27XBRD (SUTURE) ×1 IMPLANT
SUT VIC AB 3-0 SH 27 (SUTURE) ×2
SUT VIC AB 3-0 SH 27X BRD (SUTURE) ×1 IMPLANT
SYR CONTROL 10ML LL (SYRINGE) ×3 IMPLANT
TOWEL OR 17X24 6PK STRL BLUE (TOWEL DISPOSABLE) IMPLANT
TOWEL OR 17X26 10 PK STRL BLUE (TOWEL DISPOSABLE) ×3 IMPLANT
TUBE CONNECTING 12'X1/4 (SUCTIONS) ×1
TUBE CONNECTING 12X1/4 (SUCTIONS) ×2 IMPLANT
YANKAUER SUCT BULB TIP NO VENT (SUCTIONS) ×3 IMPLANT

## 2013-07-14 NOTE — Op Note (Signed)
Left Breast Needle Localized Lumpectomy Procedure Note  Indications: This patient presents with history of a left breast cancer.   Pre-operative Diagnosis: left breast cancer  Post-operative Diagnosis: Same  Surgeon: Stark Klein   Assistants: Judyann Munson, RNFA  Anesthesia: General endotracheal anesthesia  ASA Class: 3  Procedure Details  The patient was seen in the Holding Room. The risks, benefits, complications, treatment options, and expected outcomes were discussed with the patient. The possibilities of reaction to medication, pulmonary aspiration, bleeding, infection, the need for additional procedures, failure to diagnose a condition, and creating a complication requiring transfusion or operation were discussed with the patient. The patient concurred with the proposed plan, giving informed consent. The site of surgery properly noted/marked. The patient was taken to Operating Room, identified as Jordan Byrd, and the procedure verified as lumpectomy. A Time Out was held and the above information confirmed.  After induction of anesthesia, the left breast and chest were prepped and draped in standard fashion. The lumpectomy was performed by creating an oblique incision over the upper outer quadrant of the breast. Orientation sutures were placed, long lateral and short superior, and hemostasis was achieved with cautery .  Additional tissue was taken along the medial and inferior margin and submitted separately to pathology after providing orientation for the pathology. The wound was irrigated and closed with a 3-0 Vicryl deep dermal and 4-0 monocryl subcuticular closure in layers.    Sterile dressings were applied. At the end of the operation, all sponge, instrument, and needle counts were correct.  Findings: grossly clear surgical margins, posterior margin is pectoralis muscle  Estimated Blood Loss:  Minimal         Specimens: left breast cancer            Complications:   None; patient tolerated the procedure well.         Disposition: PACU - hemodynamically stable.         Condition: Stable

## 2013-07-14 NOTE — Anesthesia Preprocedure Evaluation (Addendum)
Anesthesia Evaluation  Patient identified by MRN, date of birth, ID band Patient awake    Reviewed: Allergy & Precautions, H&P , NPO status , Patient's Chart, lab work & pertinent test results  Airway Mallampati: I TM Distance: >3 FB Neck ROM: Full    Dental  (+) Caps, Edentulous Upper, Dental Advisory Given   Pulmonary shortness of breath, asthma , COPDformer smoker,  breath sounds clear to auscultation        Cardiovascular hypertension, Pt. on medications +CHF + dysrhythmias Atrial Fibrillation Rhythm:Regular Rate:Normal     Neuro/Psych  Headaches, Anxiety    GI/Hepatic GERD-  Medicated and Controlled,  Endo/Other  Morbid obesity  Renal/GU      Musculoskeletal   Abdominal (+) + obese,   Peds  Hematology   Anesthesia Other Findings   Reproductive/Obstetrics                          Anesthesia Physical Anesthesia Plan  ASA: III  Anesthesia Plan: General   Post-op Pain Management:    Induction: Intravenous  Airway Management Planned: Oral ETT  Additional Equipment:   Intra-op Plan:   Post-operative Plan: Extubation in OR  Informed Consent: I have reviewed the patients History and Physical, chart, labs and discussed the procedure including the risks, benefits and alternatives for the proposed anesthesia with the patient or authorized representative who has indicated his/her understanding and acceptance.   Dental advisory given  Plan Discussed with: CRNA and Surgeon  Anesthesia Plan Comments:         Anesthesia Quick Evaluation

## 2013-07-14 NOTE — H&P (Signed)
Chief complaint: Left breast cancer  HISTORY:  Patient is a 80 year old female who presents with screening detected left breast calcifications. She has not ever had an abnormal mammogram before. This area was approximately 2.1 cm. She subsequently underwent MRI which demonstrated post biopsy changes. Biopsy was positive for a grade 1 invasive ductal carcinoma that was ER and PR positive and HER-2 negative. Ki-67 was 6%. She is a G0. She had a hysterectomy at age 57. She does have a sister had breast cancer at age 2. She is accompanied by her grandson. She does have a history of heart failure and atrial fibrillation. She is on Xarelto, flecainide, Lasix, amlodipine, bystolic, and Symbicort. She has not had issues with her breasts in the past. She had cardiac clearance from Dr. Rayann Heman, but he and anesthesia felt she should be done at [facility] main OR   Past Medical History   Diagnosis  Date   .  Asthma    .  Shortness of breath    .  Arthritis    .  Anxiety    .  Hypertension    .  Atrial fibrillation      a. 04/04/11   .  Osteoarthritis      a. 03/28/11 - R Total Hip Arthroplasty   .  Cataracts, both eyes    .  Pneumonia    .  Bronchitis    .  Pleurisy    .  Ankle swelling    .  Kidney infection    .  Frequent urination    .  Excessive urination at night    .  Chronic headaches    .  Weight gain    .  CHF (congestive heart failure)     Past Surgical History   Procedure  Laterality  Date   .  Cardiac catheterization       1980's or 90's - reportedly normal   .  Abdominal hysterectomy   1962   .  Appendectomy   1954   .  Cholecystectomy     .  Back surgery   1990   .  Cataract extraction bilateral w/ anterior vitrectomy   2007/2008   .  Total hip arthroplasty   03/28/2011     Procedure: TOTAL HIP ARTHROPLASTY; Surgeon: Yvette Rack., MD; Location: [place]; Service: Orthopedics; Laterality: Right;   .  Cardioversion   04/05/2011     Procedure: CARDIOVERSION; Surgeon: Coralyn Mark, MD;  Location: [place]; Service: Cardiovascular; Laterality: N/A;   .  Tonsillectomy   1943   .  Adenoidectomy   1949   .  Foot surgery   1947   .  Thoracic disc surgery   2008   .  Lumbar disc surgery   2010   .  Knee surgery       both knees   .  Shoulder surgery       left   .  Total hip arthroplasty   03/28/2011     right   .  Cardioversion  N/A  06/19/2012     Procedure: CARDIOVERSION-bedside(3W36); Surgeon: Lelon Perla, MD; Location: [facility] OR; Service: Cardiovascular; Laterality: N/A;    Current Outpatient Prescriptions   Medication  Sig  Dispense  Refill   .  acetaminophen (TYLENOL) 325 MG tablet  Take 650 mg by mouth 2 (two) times daily as needed (temp above 102).     Marland Kitchen  albuterol (PROVENTIL HFA;VENTOLIN HFA) 108 (90 BASE) MCG/ACT  inhaler  Inhale 2 puffs into the lungs every 6 (six) hours as needed for wheezing.     Marland Kitchen  amLODipine (NORVASC) 10 MG tablet  Take 10 mg by mouth daily.     .  chlorzoxazone (PARAFON) 500 MG tablet  Take 250 mg by mouth 3 (three) times daily as needed for muscle spasms.     .  flecainide (TAMBOCOR) 100 MG tablet  Take 1 tablet (100 mg total) by mouth every 12 (twelve) hours.  180 tablet  3   .  furosemide (LASIX) 20 MG tablet  Take 2 tablets (40 mg total) by mouth daily.  30 tablet  3   .  Glucosamine-Chondroit-Vit C-Mn (GLUCOSAMINE CHONDR 1500 COMPLX PO)  Take 1 tablet by mouth daily.     .  IRON PO  Liquid Iron - Take 5 mg twice a day     .  Multiple Vitamins-Minerals (ICAPS PO)  Take 1 tablet by mouth 2 (two) times daily.     .  Nebivolol HCl 20 MG TABS  Take 1 tablet (20 mg total) by mouth daily.  90 tablet  3   .  omeprazole (PRILOSEC) 20 MG capsule  Take 20 mg by mouth daily.     .  Rivaroxaban (XARELTO) 15 MG TABS tablet  Take 1 tablet (15 mg total) by mouth daily.  90 tablet  3    No current facility-administered medications for this visit.    Allergies   Allergen  Reactions   .  Methocarbamol  Hives   .  Vicodin [Hydrocodone-Acetaminophen]   Other (See Comments)     Interacts with bp medication and drops blood pressure   .  Aspirin  Nausea And Vomiting   .  Butazolidin [Phenylbutazone]  Other (See Comments)     unknown   .  Celebrex [Celecoxib]  Nausea And Vomiting     Nausea and vomiting   .  Codeine  Nausea And Vomiting   .  Doripenem  Other (See Comments)     unknown   .  Aspirin Buf(Alhyd-Mghyd-Cacar)  Nausea And Vomiting   .  Temazepam  Other (See Comments)     unknown    Family History   Problem  Relation  Age of Onset   .  Diabetes  Father      Father, Mother, 4 sisters (2 living)   .  Heart attack  Father      (Deceased)   .  Heart failure  Father      (deceased 57)   .  Hypertension  Father    .  Stroke  Mother      (deceased 24)   .  Hypertension  Mother    .  Cancer  Sister      (deceased)   .  Breast cancer  Sister    .  Heart attack  Sister    .  Diabetes  Sister     History    Social History   .  Marital Status:  Widowed     Spouse Name:  N/A     Number of Children:  N/A   .  Years of Education:  N/A    Occupational History   .  Retired     Social History Main Topics   .  Smoking status:  Former Smoker -- 21 years     Types:  Cigarettes     Quit date:  03/17/1970   .  Smokeless tobacco:  Former Systems developer   .  Alcohol Use:  No   .  Drug Use:  No   .  Sexual Activity:  Not Currently    Other Topics  Concern   .  Not on file    Social History Narrative    Lives alone in [place]. Widowed in 2012 (husband w/ dementia & parkinsons)..    Not active, does not drive due to vision problems.     REVIEW OF SYSTEMS - PERTINENT POSITIVES ONLY:  12 point review of systems negative other than HPI and PMH except for joint pain, shortness of breath, back pain, anxiety, and weakness.   EXAM:  Wt Readings from Last 3 Encounters:  07/14/13 240 lb (108.863 kg)  07/14/13 240 lb (108.863 kg)  07/14/13 240 lb (108.863 kg)   Temp Readings from Last 3 Encounters:  07/14/13 97.8 F (36.6 C) Oral  07/14/13  97.8 F (36.6 C) Oral  07/14/13 97.8 F (36.6 C) Oral   BP Readings from Last 3 Encounters:  07/14/13 190/71  07/14/13 190/71  07/14/13 190/71   Pulse Readings from Last 3 Encounters:  07/14/13 63  07/14/13 63  07/14/13 63    Gen: No acute distress. Well nourished and well groomed.  Neurological: Alert and oriented to person, place, and time. Coordination sl impaired, uses walker.  Head: Normocephalic and atraumatic.  Eyes: Conjunctivae are normal. Pupils are equal, round, and reactive to light. No scleral icterus. .  Cardiovascular: irreg Breast: No palpable masses. No skin dimpling. No nipple retraction. No lymphadenopathy. She is not having any nipple retraction.  Respiratory: Effort normal. No respiratory distress.  Skin: Skin is warm and dry. No rash noted. No diaphoresis. No erythema. No pallor. No clubbing, cyanosis, or edema.  Psychiatric: Normal mood and affect. Behavior is normal. Judgment and thought content normal.    LABORATORY RESULTS:  Available labs are reviewed  Pathology  Diagnosis  Breast, left, needle core biopsy, upper outer  - INVASIVE DUCTAL CARCINOMA.  - FIBROCYSTIC CHANGES WITH CALCIFICATIONS.  - SEE COMMENT.   Recent Results (from the past 2160 hour(s))   CBC WITH DIFFERENTIAL Status: Abnormal    Collection Time    06/01/13 8:17 AM   Result  Value  Ref Range    WBC  7.7  3.9 - 10.3 10e3/uL    NEUT#  5.6  1.5 - 6.5 10e3/uL    HGB  13.2  11.6 - 15.9 g/dL    HCT  40.2  34.8 - 46.6 %    Platelets  261  145 - 400 10e3/uL    MCV  92.1  79.5 - 101.0 fL    MCH  30.2  25.1 - 34.0 pg    MCHC  32.8  31.5 - 36.0 g/dL    RBC  4.36  3.70 - 5.45 10e6/uL    RDW  14.8 (*)  11.2 - 14.5 %    lymph#  1.3  0.9 - 3.3 10e3/uL    MONO#  0.6  0.1 - 0.9 10e3/uL    Eosinophils Absolute  0.2  0.0 - 0.5 10e3/uL    Basophils Absolute  0.1  0.0 - 0.1 10e3/uL    NEUT%  71.7  38.4 - 76.8 %    LYMPH%  16.9  14.0 - 49.7 %    MONO%  8.3  0.0 - 14.0 %    EOS%  2.4  0.0  - 7.0 %    BASO%  0.7  0.0 - 2.0 %  COMPREHENSIVE METABOLIC PANEL (TK24) Status: Abnormal    Collection Time    06/01/13 8:18 AM   Result  Value  Ref Range    Sodium  145  136 - 145 mEq/L    Potassium  3.7  3.5 - 5.1 mEq/L    Chloride  104  98 - 109 mEq/L    CO2  27  22 - 29 mEq/L    Glucose  94  70 - 140 mg/dl    BUN  36.1 (*)  7.0 - 26.0 mg/dL    Creatinine  1.4 (*)  0.6 - 1.1 mg/dL    Total Bilirubin  0.30  0.20 - 1.20 mg/dL    Alkaline Phosphatase  87  40 - 150 U/L    AST  15  5 - 34 U/L    ALT  12  0 - 55 U/L    Total Protein  6.9  6.4 - 8.3 g/dL    Albumin  4.0  3.5 - 5.0 g/dL    Calcium  10.1  8.4 - 10.4 mg/dL    Anion Gap  13 (*)  3 - 11 mEq/L    RADIOLOGY RESULTS:  See E-Chart or I-Site for most recent results. Images and reports are reviewed.  Mr Breast Bilateral W Wo Contrast  05/26/2013 CLINICAL DATA: Biopsy left upper outer quadrant calcifications reveal invasive ductal carcinoma LABS: BUN and creatinine were obtained on site at [place] at [address] Results: BUN 32 mg/dL, Creatinine 1.3 mg/dL. EXAM: BILATERAL BREAST MRI WITH AND WITHOUT CONTRAST TECHNIQUE: Multiplanar, multisequence MR images of both breasts were obtained prior to and following the intravenous administration of 65m of MultiHance. THREE-DIMENSIONAL MR IMAGE RENDERING ON INDEPENDENT WORKSTATION: Three-dimensional MR images were rendered by post-processing of the original MR data on an independent workstation. The three-dimensional MR images were interpreted, and findings are reported in the following complete MRI report for this study. Three dimensional images were evaluated at the independent DynaCad workstation COMPARISON: Previous exams FINDINGS: Breast composition: c: Heterogeneous fibroglandular tissue Background parenchymal enhancement: Marked Right breast: No mass or abnormal enhancement. Left breast: No evidence of mass or abnormal enhancement. There is a marker clip from recent  stereotactic biopsy identified at the middle third depth in the 2 o'clock position, with a thin tract of T2 hyperintensity and enhancement extending from the lateral dermis down to the biopsy cavity. There is no evidence of mass on MRI. Lymph nodes: No abnormal appearing lymph nodes. Ancillary findings: There is moderate left and mild right subareolar duct ectasia. There is a particularly dilated left subareolar duct demonstrating proteinaceous fluid contents. There is no evidence of enhancement within or around the dilated ducts, areola, or nipple on either side. IMPRESSION: Post biopsy change corresponding to calcifications associated with invasive ductal carcinoma of the left breast. No other suspicious findings. RECOMMENDATION: Proceed with treatment planning BI-RADS CATEGORY 6: Known biopsy-proven malignancy. Electronically Signed By: RSkipper ClicheM.D. On: 05/26/2013 11:08  Mm Lt Breast Bx W Loc Dev 1st Lesion Image Bx Spec Stereo Guide  05/20/2013 ADDENDUM REPORT: 05/20/2013 08:26 ADDENDUM: Final pathology demonstrates INVASIVE DUCTAL CARCINOMA- grade 1. Histology correlates with imaging findings. The patient was contacted by phone on 05/20/2013 and these results given to her which she understood. Her questions were answered. The patient had no complaints with her biopsy site. . Recommend surgery/ oncology consultation. [facility] and the patient informed. Recommend bilateral breast MRI which  will be scheduled by our office and the patient notified. Electronically Signed By: Hassan Rowan M.D. On: 05/20/2013 08:26  05/20/2013 CLINICAL DATA: 80 year old female for tissue sampling of suspicious calcifications in the upper outer left breast. EXAM: LEFT STEREOTACTIC CORE NEEDLE BIOPSY COMPARISON: Previous exams. FINDINGS: The patient and I discussed the procedure of stereotactic-guided biopsy including benefits and alternatives. We  discussed the high likelihood of a successful procedure. We discussed the risks of the procedure including infection, bleeding, tissue injury, clip migration, and inadequate sampling. Informed written consent was given. The usual time out protocol was performed immediately prior to the procedure. Using sterile technique and 2% Lidocaine as local anesthetic, under stereotactic guidance, a 12 gauge vacuum assisted needle device was used to perform core needle biopsy of calcifications in the upper outer left breast using a superior approach. Specimen radiograph was performed showing calcifications. Specimens with calcifications are identified for pathology. At the conclusion of the procedure, a coil shaped tissue marker clip was deployed into the biopsy cavity. Follow-up 2-view mammogram confirmed clip clip placement to be satisfactory. IMPRESSION: Stereotactic-guided biopsy of left breast calcifications. No apparent complications. Pathology will be followed. Electronically Signed: By: Hassan Rowan M.D. On: 05/19/2013 10:35    ASSESSMENT AND PLAN:  Breast cancer of upper-outer quadrant of left female breast  The patient has a new diagnosis of a T2 N0 left breast cancer.  We will do needle localized lumpectomy, no SLN due to health status. I discussed risks of surgery including bleeding, infection, damage to adjacent structures, pain, and possible risk of needing additional surgeries to clear margins.    Milus Height MD  Surgical Oncology, [facility]     Visit Diagnoses:  1.  Breast cancer of upper-outer quadrant of left female breast     Primary Care Physician:  [name], MD  [place], [place], [place].

## 2013-07-14 NOTE — Anesthesia Procedure Notes (Signed)
Procedure Name: Intubation Date/Time: 07/14/2013 10:40 AM Performed by: Manuela Schwartz B Pre-anesthesia Checklist: Patient identified, Emergency Drugs available, Suction available, Patient being monitored and Timeout performed Patient Re-evaluated:Patient Re-evaluated prior to inductionOxygen Delivery Method: Circle system utilized Preoxygenation: Pre-oxygenation with 100% oxygen Intubation Type: IV induction Laryngoscope Size: Mac and 3 Grade View: Grade I Tube type: Oral Number of attempts: 1 Airway Equipment and Method: Stylet Placement Confirmation: ETT inserted through vocal cords under direct vision,  positive ETCO2 and breath sounds checked- equal and bilateral Secured at: 21 cm Tube secured with: Tape Dental Injury: Teeth and Oropharynx as per pre-operative assessment

## 2013-07-14 NOTE — Discharge Instructions (Signed)
Restart Xarelto tomorrow.  [place] Office Phone Number [phone]  BREAST BIOPSY/ PARTIAL MASTECTOMY: POST OP INSTRUCTIONS  Always review your discharge instruction sheet given to you by the facility where your surgery was performed.  IF YOU HAVE DISABILITY OR FAMILY LEAVE FORMS, YOU MUST BRING THEM TO THE OFFICE FOR PROCESSING.  DO NOT GIVE THEM TO YOUR DOCTOR.  1. A prescription for pain medication may be given to you upon discharge.  Take your pain medication as prescribed, if needed.  If narcotic pain medicine is not needed, then you may take acetaminophen (Tylenol) or ibuprofen (Advil) as needed. 2. Take your usually prescribed medications unless otherwise directed 3. If you need a refill on your pain medication, please contact your pharmacy.  They will contact our office to request authorization.  Prescriptions will not be filled after 5pm or on week-ends. 4. You should eat very light the first 24 hours after surgery, such as soup, crackers, pudding, etc.  Resume your normal diet the day after surgery. 5. Most patients will experience some swelling and bruising in the breast.  Ice packs and a good support bra will help.  Swelling and bruising can take several days to resolve.  6. It is common to experience some constipation if taking pain medication after surgery.  Increasing fluid intake and taking a stool softener will usually help or prevent this problem from occurring.  A mild laxative (Milk of Magnesia or Miralax) should be taken according to package directions if there are no bowel movements after 48 hours. 7. Unless discharge instructions indicate otherwise, you may remove your bandages 48 hours after surgery, and you may shower at that time.  You may have steri-strips (small skin tapes) in place directly over the incision.  These strips should be left on the skin for 7-10 days.   Any sutures or staples will be removed at the office during your follow-up  visit. 8. ACTIVITIES:  You may resume regular daily activities (gradually increasing) beginning the next day.  Wearing a good support bra or sports bra (or the breast binder) minimizes pain and swelling.  You may have sexual intercourse when it is comfortable. a. You may drive when you no longer are taking prescription pain medication, you can comfortably wear a seatbelt, and you can safely maneuver your car and apply brakes. b. RETURN TO WORK:  __________1 week_______________ 9. You should see your doctor in the office for a follow-up appointment approximately two weeks after your surgery.  Your doctors nurse will typically make your follow-up appointment when she calls you with your pathology report.  Expect your pathology report 2-3 business days after your surgery.  You may call to check if you do not hear from [place] after three days.   WHEN TO CALL YOUR DOCTOR: 1. Fever over 101.0 2. Nausea and/or vomiting. 3. Extreme swelling or bruising. 4. Continued bleeding from incision. 5. Increased pain, redness, or drainage from the incision.  The clinic staff is available to answer your questions during regular business hours.  Please dont hesitate to call and ask to speak to one of the nurses for clinical concerns.  If you have a medical emergency, go to the nearest emergency room or call 911.  A surgeon from [facility] is always on call at the hospital.  For further questions, please visit [email or web address]

## 2013-07-14 NOTE — Transfer of Care (Signed)
Immediate Anesthesia Transfer of Care Note  Patient: Jordan Byrd  Procedure(s) Performed: Procedure(s): BREAST LUMPECTOMY WITH NEEDLE LOCALIZATION (Left)  Patient Location: PACU  Anesthesia Type:General  Level of Consciousness: awake, alert  and oriented  Airway & Oxygen Therapy: Patient Spontanous Breathing  Post-op Assessment: Report given to PACU RN and Post -op Vital signs reviewed and stable  Post vital signs: Reviewed and stable  Complications: No apparent anesthesia complications

## 2013-07-18 ENCOUNTER — Encounter (HOSPITAL_COMMUNITY): Payer: Self-pay | Admitting: General Surgery

## 2013-07-19 ENCOUNTER — Telehealth (INDEPENDENT_AMBULATORY_CARE_PROVIDER_SITE_OTHER): Payer: Self-pay

## 2013-07-19 ENCOUNTER — Telehealth: Payer: Self-pay | Admitting: *Deleted

## 2013-07-19 NOTE — Telephone Encounter (Signed)
Pathology shows DCIS - no invasive cancer.  Margins are clear.

## 2013-07-19 NOTE — Progress Notes (Signed)
Quick Note:  Please let patient know that path showed only DCIS (NO invasive cancer) and margins are negative. ______

## 2013-07-19 NOTE — Telephone Encounter (Signed)
Spoke with patient and rescheduled her appointment to after she finishes radiation.  Confirmed new appointment for 10/06/13 at 10am with Dr. Jana Hakim.

## 2013-07-25 NOTE — Anesthesia Postprocedure Evaluation (Signed)
  Anesthesia Post-op Note  Patient: Jordan Byrd  Procedure(s) Performed: Procedure(s): BREAST LUMPECTOMY WITH NEEDLE LOCALIZATION (Left)  Patient Location: PACU  Anesthesia Type:General  Level of Consciousness: awake and alert   Airway and Oxygen Therapy: Patient Spontanous Breathing  Post-op Pain: mild  Post-op Assessment: Post-op Vital signs reviewed  Post-op Vital Signs: stable  Last Vitals:  Filed Vitals:   07/14/13 1300  BP: 146/77  Pulse:   Temp:   Resp: 18    Complications: No apparent anesthesia complications

## 2013-07-26 ENCOUNTER — Encounter: Payer: Self-pay | Admitting: *Deleted

## 2013-07-26 NOTE — Progress Notes (Unsigned)
Location of Breast Cancer: Left Breast upper-outer quadrant   Histology per Pathology Report: Diagnosis 4/216/15: Breast, left, needle core biopsy, upper outer- INVASIVE DUCTAL CARCINOMA.- FIBROCYSTIC CHANGES WITH CALCIFICATIONS.  Receptor Status: ER(  +  ), PR ( +   ), Her2-neu ( NEG KI 67 6%   )  Did patient present with symptoms (if so, please note symptoms) or was this found on screening mammography?: routine screening mammogram 03/16/13, 04/13/13 had left unilateral mammogram    Past/Anticipated interventions by surgeon, if POE:UMPNTIRWE 07/14/13: 1. Breast, lumpectomy, left- DUCTAL CARCINOMA IN SITU WITH CALCIFICATIONS, LOW TO INTERMEDIATE GRADE,SPANNING 0.9 CM- THE SURGICAL RESECTION MARGINS ARE NEGATIVE FOR CARCINOMA.- SEE ONCOLOGY TABLE BELOW.2. Breast, excision, additional new medial margin- FIBROCYSTIC CHANGES WITH CALCIFICATIONS.- THERE IS NO EVIDENCE OF MALIGNANCY.- SEE COMMENT.3. Breast, excision, additional new inferior margin- FIBROCYSTIC CHANGES WITH CALCIFICATIONS.- THERE IS NO EVIDENCE OF MALIGNANCY.- Dr. Stark Klein, MD follow up s/p op 07/29/13  Past/Anticipated interventions by medical oncology, if any: Chemotherapy seen in Breast Clinic 06/01/13 Dr.Magrinat, scheduled for genetics,  Referral for radiation tx first then ,f/u appt 10/06/13, likely then  start anastrozle  Lymphedema issues, if any:  No  Pain issues, if any:  NO  SAFETY ISSUES:  Prior radiation? No  Pacemaker/ICD? No  Possible current pregnancy? No  Is the patient on methotrexate? No  Current Complaints / other details:  Widow, G)PO,3 stepchildren, menarche age 71, complete hysterectomy 73( age 50 ,) , took HRT >30 years, Ronald,grandson is her healthcare POA,smoked cigarettes 21 years,quit 03/17/70, former user smokeless tobacco, no alcohol or illicit drugs, anxiety,A-trial fib, Cardioversion 3/ 2/13,&  Niece with breast cancer, sister breast cancer deceased , onset age 70    Roslynn Amble, Felicita Gage,  RN 07/26/2013,2:34 PM

## 2013-07-28 ENCOUNTER — Ambulatory Visit
Admission: RE | Admit: 2013-07-28 | Discharge: 2013-07-28 | Disposition: A | Discharge status: Home / Self Care | Payer: Medicare Other | Source: Ambulatory Visit | Attending: Radiation Oncology | Admitting: Radiation Oncology

## 2013-07-28 ENCOUNTER — Telehealth: Payer: Self-pay | Admitting: *Deleted

## 2013-07-28 ENCOUNTER — Encounter: Payer: Self-pay | Admitting: Radiation Oncology

## 2013-07-28 VITALS — BP 147/59 | HR 56 | Temp 97.8°F | Wt 246.6 lb

## 2013-07-28 DIAGNOSIS — Z7901 Long term (current) use of anticoagulants: Secondary | ICD-10-CM | POA: Insufficient documentation

## 2013-07-28 DIAGNOSIS — Z510 Encounter for antineoplastic radiation therapy: Secondary | ICD-10-CM | POA: Diagnosis present

## 2013-07-28 DIAGNOSIS — Z79899 Other long term (current) drug therapy: Secondary | ICD-10-CM | POA: Diagnosis not present

## 2013-07-28 DIAGNOSIS — C50412 Malignant neoplasm of upper-outer quadrant of left female breast: Secondary | ICD-10-CM

## 2013-07-28 DIAGNOSIS — Z170 Estrogen receptor positive status [ER+]: Secondary | ICD-10-CM | POA: Diagnosis not present

## 2013-07-28 DIAGNOSIS — D0590 Unspecified type of carcinoma in situ of unspecified breast: Secondary | ICD-10-CM | POA: Diagnosis not present

## 2013-07-28 NOTE — Telephone Encounter (Signed)
Patient dizzy at elevator, escorted her to chair and obtained w/c for her,patient has yellow band on right wrist

## 2013-07-28 NOTE — Progress Notes (Signed)
Please see the Nurse Progress Note in the MD Initial Consult Encounter for this patient. 

## 2013-07-29 ENCOUNTER — Encounter: Payer: Self-pay | Admitting: Genetic Counselor

## 2013-07-29 ENCOUNTER — Encounter (INDEPENDENT_AMBULATORY_CARE_PROVIDER_SITE_OTHER): Payer: Self-pay | Admitting: General Surgery

## 2013-07-29 ENCOUNTER — Ambulatory Visit (INDEPENDENT_AMBULATORY_CARE_PROVIDER_SITE_OTHER): Payer: Medicare Other | Admitting: General Surgery

## 2013-07-29 ENCOUNTER — Ambulatory Visit
Admission: RE | Admit: 2013-07-29 | Discharge: 2013-07-29 | Disposition: A | Discharge status: Home / Self Care | Payer: Medicare Other | Source: Ambulatory Visit | Attending: Radiation Oncology | Admitting: Radiation Oncology

## 2013-07-29 VITALS — BP 136/56 | HR 64 | Temp 97.2°F | Resp 20 | Ht 66.0 in | Wt 243.6 lb

## 2013-07-29 DIAGNOSIS — C50419 Malignant neoplasm of upper-outer quadrant of unspecified female breast: Secondary | ICD-10-CM

## 2013-07-29 DIAGNOSIS — C50412 Malignant neoplasm of upper-outer quadrant of left female breast: Secondary | ICD-10-CM

## 2013-07-29 DIAGNOSIS — C50919 Malignant neoplasm of unspecified site of unspecified female breast: Secondary | ICD-10-CM

## 2013-07-29 DIAGNOSIS — Z803 Family history of malignant neoplasm of breast: Secondary | ICD-10-CM

## 2013-07-29 NOTE — Assessment & Plan Note (Signed)
No evidence of surgical complications.    Follow up in 3 months.  She has appt with oncology to discuss whether there is a role for antiestrogens.

## 2013-07-29 NOTE — Progress Notes (Signed)
HPI:  Jordan Byrd was previously seen in the [place] clinic due to a personal and family history of cancer and concerns regarding a hereditary predisposition to cancer. Please refer to our prior cancer genetics clinic note for more information regarding Jordan Byrd's medical, social and family histories, and our assessment and recommendations, at the time. Jordan Byrd recent genetic test results were disclosed to her, as were recommendations warranted by these results. These results and recommendations are discussed in more detail below.  GENETIC TEST RESULTS: At the time of Jordan Byrd's visit, we recommended she pursue genetic testing of the BreastNext gene panel. This test, which included sequencing and deletion/duplication analysis of the genes listed on the test report, was performed at [facility]. Genetic testing was normal, and did not reveal a mutation in any of these genes. A complete list of all genes tested is located on the test report which will be scanned into EP[employer].    We discussed with Jordan Byrd that since the current genetic testing is not perfect, it is possible there may be a gene mutation in one of these genes that current testing cannot detect, but that chance is small.  We also discussed, that it is possible that another gene that has not yet been discovered, or that we have not yet tested, is responsible for the cancer diagnoses in the family, and it is, therefore, important to remain in touch with cancer genetics in the future so that we can continue to offer Jordan Byrd the most up to date genetic testing.   CANCER SCREENING RECOMMENDATIONS: This result is reassuring and suggests that Jordan Byrd's cancer was most likely not due to an inherited predisposition associated with one of these genes.  Most cancers happen by chance and this negative test suggests that her cancer falls into this category.  We, therefore, recommended she continue to follow the  cancer management and screening guidelines provided by her primary providers.   RECOMMENDATIONS FOR FAMILY MEMBERS:  Women in this family might be at some increased risk of developing cancer, over the general population risk, simply due to the family history of cancer.  We recommended women in this family have a yearly mammogram beginning at age 78, an an annual clinical breast exam, and perform monthly breast self-exams. Women in this family should also have a gynecological exam as recommended by their primary provider. All family members should have a colonoscopy by age 23.   FOLLOW-UP: Lastly, we discussed with Jordan Byrd that cancer genetics is a rapidly advancing field and it is possible that new genetic tests will be appropriate for her and/or her family members in the future. We encouraged her to remain in contact with cancer genetics on an annual basis so we can update her personal and family histories and let her know of advances in cancer genetics that may benefit this family.   Our contact number was provided. Jordan Byrd questions were answered to her satisfaction, and she knows she is welcome to call us at anytime with additional questions or concerns. This patient was discussed with Dr. Jana Hakim who agrees with the above.   Catherine A. Fine, MS, CGC Certified Genetic Counseor phone: [phone] [email or web address]

## 2013-07-29 NOTE — Addendum Note (Signed)
Encounter addended by: Thea Silversmith, MD on: 07/29/2013  9:14 AM<BR>     Documentation filed: Clinical Notes

## 2013-07-29 NOTE — Progress Notes (Signed)
HISTORY: Patient is 80 years old and is around 2 weeks status post needle localized lumpectomy for left breast cancer. She is doing well. She states that she only needed a few pain pills. She is not having any discomfort now. She denies fevers and chills. She is doing well from a heart and lung standpoint. She continues to get around with her walker.    EXAM: General:  Alert and oriented Incision:  Healing well. Small seroma. No erythema.   PATHOLOGY: Diagnosis 1. Breast, lumpectomy, left - DUCTAL CARCINOMA IN SITU WITH CALCIFICATIONS, LOW TO INTERMEDIATE GRADE, SPANNING 0.9 CM - THE SURGICAL RESECTION MARGINS ARE NEGATIVE FOR CARCINOMA. - SEE ONCOLOGY TABLE BELOW. 2. Breast, excision, additional new medial margin - FIBROCYSTIC CHANGES WITH CALCIFICATIONS. - THERE IS NO EVIDENCE OF MALIGNANCY. - SEE COMMENT. 3. Breast, excision, additional new inferior margin - FIBROCYSTIC CHANGES WITH CALCIFICATIONS. - THERE IS NO EVIDENCE OF MALIGNANCY. - SEE COMMENT.   ASSESSMENT AND PLAN:   Breast cancer of upper-outer quadrant of left female breast No evidence of surgical complications.    Follow up in 3 months.  She has appt with oncology to discuss whether there is a role for antiestrogens.        Milus Height, MD Surgical Oncology, [facility]  GREEN, Keenan Bachelor, MD No ref. Aashish Hamm found

## 2013-07-29 NOTE — Patient Instructions (Signed)
Call if you need anything.  Follow up with oncology.  Come back to see me in 3-4 months.

## 2013-07-29 NOTE — Progress Notes (Signed)
Department of Radiation Oncology  Phone:  [phone] Fax:        [phone]   Name: KENEDI CILIA MRN: [id number]  DOB: 1933/07/25  Date: 07/28/2013  Follow Up Visit Note  Diagnosis: DCIS of the left breast  Interval History: Verdene presents today for routine followup.  She had her lumpectomy on 6/11. She had an area of intermedicate grade DCIS measuring 9 mm. Margins were negative. ER and PR were both 100%. She has healed up well. She is accompanied by her grandson. She is leaning towards not having radiation. She has an appointment with medical oncology in September and with surgery on Friday.   Allergies:  Allergies  Allergen Reactions  . Methocarbamol Hives  . Vicodin [Hydrocodone-Acetaminophen] Other (See Comments)    Interacts with bp medication and drops blood pressure  . Aspirin Nausea And Vomiting  . Butazolidin [Phenylbutazone] Other (See Comments)    unknown  . Celebrex [Celecoxib] Nausea And Vomiting    Nausea and vomiting  . Codeine Nausea And Vomiting  . Doripenem Other (See Comments)    unknown  . Aspirin Buf(Alhyd-Mghyd-Cacar) Nausea And Vomiting  . Temazepam Other (See Comments)    unknown    Medications:  Current Outpatient Prescriptions  Medication Sig Dispense Refill  . acetaminophen (TYLENOL) 325 MG tablet Take 650 mg by mouth 2 (two) times daily as needed (temp above 102).       Marland Kitchen albuterol (PROVENTIL HFA;VENTOLIN HFA) 108 (90 BASE) MCG/ACT inhaler Inhale 1 puff into the lungs every 6 (six) hours as needed for wheezing.       Marland Kitchen amLODipine (NORVASC) 10 MG tablet Take 10 mg by mouth daily.      . budesonide-formoterol (SYMBICORT) 160-4.5 MCG/ACT inhaler Inhale 1 puff into the lungs 2 (two) times daily.       . chlorzoxazone (PARAFON) 500 MG tablet Take 250 mg by mouth 3 (three) times daily as needed for muscle spasms.      . flecainide (TAMBOCOR) 100 MG tablet Take 1 tablet (100 mg total) by mouth every 12 (twelve) hours.  180 tablet  3  .  furosemide (LASIX) 20 MG tablet Take 60 mg by mouth daily.      . Glucosamine-Chondroit-Vit C-Mn (GLUCOSAMINE CHONDR 1500 COMPLX PO) Take 1 tablet by mouth daily.      . IRON PO Liquid Iron - Take 5 mg twice a day      . Multiple Vitamin (MULTIVITAMINS PO) Take 1 tablet by mouth daily.      . Multiple Vitamins-Minerals (ICAPS PO) Take 1 tablet by mouth 2 (two) times daily.      . nebivolol (BYSTOLIC) 10 MG tablet Take 20 mg by mouth daily.      Marland Kitchen omeprazole (PRILOSEC) 20 MG capsule Take 20 mg by mouth daily.      . Rivaroxaban (XARELTO) 15 MG TABS tablet Take 1 tablet (15 mg total) by mouth daily.  90 tablet  3  . traMADol (ULTRAM) 50 MG tablet Take 1-2 tablets (50-100 mg total) by mouth every 6 (six) hours as needed.  10 tablet  1   No current facility-administered medications for this encounter.    Physical Exam:  Filed Vitals:   07/28/13 1017  BP: 147/59  Pulse: 56  Temp: 97.8 F (36.6 C)  Weight: 246 lb 9.6 oz (111.857 kg)  SpO2: 96%   Pleasant female no distress. In a wheelchair. Left breast incision healing well.   IMPRESSION: Demiana is a 80 y.o. female  s/p lumpectomy for DCIS  PLAN:   I spoke to the patient today regarding her diagnosis and options for treatment. We discussed the role of radiation in decreasing local failures in patients who undergo lumpectomy. We discussed that she had several low risk features of DCIS including intermediate grade, age, negative margins and size. We discussed the difference between local and systemic treatment.  We discussed the process of simulation and the placement tattoos. We discussed 5 weeks of treatment as an outpatient. We discussed the possibility of asymptomatic lung damage and the use of breath hold technique for cardiac sparing. We discussed the low likelihood of secondary malignancies. We discussed the possible side effects including but not limited to skin redness, fatigue, permanent skin darkening, and breast swelling. I asked her to  give me a call if she would like to proceed with radiation. She is leaning toward no treatment although she understands that she may have recurrence in the future that has an equal chance of being invasive or DCIS and that she must have yearly mammograms. I also recommended a post op mammogram given that her calcifications are much smaller than what was seen on mammogram and I have ordered that. She has follow up with Dr. Barry Dienes on Friday.     Thea Silversmith, MD

## 2013-08-15 ENCOUNTER — Ambulatory Visit
Admission: RE | Admit: 2013-08-15 | Discharge: 2013-08-15 | Disposition: A | Discharge status: Home / Self Care | Payer: Medicare Other | Source: Ambulatory Visit | Attending: Internal Medicine | Admitting: Internal Medicine

## 2013-08-15 ENCOUNTER — Other Ambulatory Visit: Payer: Self-pay | Admitting: Internal Medicine

## 2013-08-15 DIAGNOSIS — R0781 Pleurodynia: Secondary | ICD-10-CM

## 2013-08-15 DIAGNOSIS — Z853 Personal history of malignant neoplasm of breast: Secondary | ICD-10-CM

## 2013-08-30 ENCOUNTER — Ambulatory Visit: Payer: Medicare Other | Admitting: Oncology

## 2013-09-01 ENCOUNTER — Telehealth: Payer: Self-pay | Admitting: *Deleted

## 2013-09-01 ENCOUNTER — Ambulatory Visit: Payer: Medicare Other | Admitting: Oncology

## 2013-09-01 NOTE — Telephone Encounter (Signed)
Xarelto samples placed at front desk for pick up.

## 2013-09-22 ENCOUNTER — Encounter (INDEPENDENT_AMBULATORY_CARE_PROVIDER_SITE_OTHER): Payer: Self-pay | Admitting: General Surgery

## 2013-10-06 ENCOUNTER — Ambulatory Visit (HOSPITAL_BASED_OUTPATIENT_CLINIC_OR_DEPARTMENT_OTHER): Payer: Medicare Other | Admitting: Oncology

## 2013-10-06 ENCOUNTER — Telehealth: Payer: Self-pay | Admitting: Oncology

## 2013-10-06 VITALS — BP 167/60 | HR 62 | Temp 97.8°F | Resp 18 | Ht 66.0 in | Wt 247.1 lb

## 2013-10-06 DIAGNOSIS — I480 Paroxysmal atrial fibrillation: Secondary | ICD-10-CM

## 2013-10-06 DIAGNOSIS — C50419 Malignant neoplasm of upper-outer quadrant of unspecified female breast: Secondary | ICD-10-CM

## 2013-10-06 DIAGNOSIS — C50412 Malignant neoplasm of upper-outer quadrant of left female breast: Secondary | ICD-10-CM

## 2013-10-06 DIAGNOSIS — M1990 Unspecified osteoarthritis, unspecified site: Secondary | ICD-10-CM

## 2013-10-06 DIAGNOSIS — N183 Chronic kidney disease, stage 3 unspecified: Secondary | ICD-10-CM

## 2013-10-06 DIAGNOSIS — N179 Acute kidney failure, unspecified: Secondary | ICD-10-CM

## 2013-10-06 DIAGNOSIS — Z170 Estrogen receptor positive status [ER+]: Secondary | ICD-10-CM

## 2013-10-06 NOTE — Progress Notes (Signed)
Jordan Byrd  [phone] Fax:(336) [phone]     ID: [name] OB: Aug 19, 1933  MR#: [id number]  OJJ#:[id number]  PCP: [name], MD GYN:   SU: Stark Klein OTHER MD: Thea Silversmith, Laurence Spates, Truitt Merle  CHIEF COMPLAINT: Estrogen receptor positive invasive breast cancer CURRENT TREATMENT observation   BREAST CANCER HISTORY: From the original intake note:   [name] underwent routine screening mammography at the breast Center 03/16/2013. The breast densities category C. In the left breast there were calcifications which call for further evaluation and on 04/13/2013 the patient underwent left unilateral mammography this confirmed a small group of pleomorphic calcifications not associated with architectural distortion. They had shown some change as compared to 2011.  Biopsy of the area in question 05/19/2013 showed ([id number]) and invasive ductal carcinoma, grade 1, estrogen receptor 100% positive, progesterone receptor 100% positive on both with strong staining intensity, with an MIB-1 of 6% and no HER-2 amplification, the signals ratio being 1.10 and a copy number per cell 2.30.  On 05/24/2013 the patient underwent bilateral breast MRI. There were only post biopsy changes noted in the left breast. There was no abnormality noted in the right breast and there are no abnormal appearing lymph nodes.  The patient's subsequent history is as detailed below   INTERVAL HISTORY: Jordan Byrd returns today for followup of her breast cancer accompanied by her grandson. Since her last visit here she underwent a left lumpectomy (07/14/2013), which showed only ductal carcinoma in situ, low-grade, spanning 0.9 cm, with negative margins. The breast prognostic profile was not repeated ([id number]). She also met with Dr. Pablo Ledger, who discussed adjuvant radiation with the patient. The patient has opted against radiation treatments.  REVIEW OF SYSTEMS: Eller tells me  she is doing "terrific". She does have pain from her arthritis, which she describes as achy, stabbing, and throbbing. This is intermittent. She has an irregular heartbeat and a heart murmur. Her feet swell. She is short of breath with walking any distance and she spends most of the day planning computer games and watching TV. She tolerated her June surgery well, with no unusual pain, fever, or bleeding. There have been no unusual headaches . Her vision is very limited. She denies diarrhea or constipation problems. A detailed review of systems today was otherwise stable  PAST MEDICAL HISTORY: Past Medical History  Diagnosis Date  . Asthma   . Arthritis   . Hypertension   . Atrial fibrillation     a. 04/04/11  . Osteoarthritis     a. 03/28/11 - R Total Hip Arthroplasty  . Cataracts, both eyes   . Pneumonia   . Bronchitis   . Pleurisy   . Ankle swelling   . Kidney infection   . Frequent urination   . Excessive urination at night   . Weight gain   . CHF (congestive heart failure)   . Shortness of breath     doe-  . Anemia   . Anxiety     pt denies this  . GERD (gastroesophageal reflux disease)     [name] Prilosec  . Chronic headaches     no longer having headaches  . Macular degeneration   . Breast cancer 05/19/13    left breast stage IIA     PAST SURGICAL HISTORY: Past Surgical History  Procedure Laterality Date  . Cardiac catheterization      1980's or 90's - reportedly normal  . Abdominal hysterectomy  1962  . Appendectomy  1954  . Cholecystectomy    . Back surgery  1990  . Cataract extraction bilateral w/ anterior vitrectomy  2007/2008  . Total hip arthroplasty  03/28/2011    Procedure: TOTAL HIP ARTHROPLASTY;  Surgeon: Yvette Rack., MD;  Location: [facility];  Service: Orthopedics;  Laterality: Right;  . Cardioversion  04/05/2011    Procedure: CARDIOVERSION;  Surgeon: Coralyn Mark, MD;  Location: [facility];  Service: Cardiovascular;  Laterality: N/A;  . Tonsillectomy  1943  .  Adenoidectomy  1949  . Foot surgery  1947  . Thoracic disc surgery  2008  . Lumbar disc surgery  2010  . Knee surgery       both knees  . Shoulder surgery      left  . Total hip arthroplasty  03/28/2011    right  . Cardioversion N/A 06/19/2012    Procedure: CARDIOVERSION-bedside(3W36);  Surgeon: Lelon Perla, MD;  Location: [facility];  Service: Cardiovascular;  Laterality: N/A;  . Colonoscopy    . Breast lumpectomy with needle localization Left 07/14/2013    Procedure: BREAST LUMPECTOMY WITH NEEDLE LOCALIZATION;  Surgeon: Stark Klein, MD;  Location: [facility];  Service: General;  Laterality: Left;    FAMILY HISTORY Family History  Problem Relation Age of Onset  . Diabetes Father     Father, Mother, 4 sisters (2 living)  . Heart attack Father     (Deceased)  . Heart failure Father     (deceased 68)  . Hypertension Father   . Stroke Mother     (deceased 77)  . Hypertension Mother   . Cancer Sister 55    breast cancer  . Heart attack Sister   . Diabetes Sister   . Cancer Other 64    niece with breast cancer  The patient's father died at the age of 19 with congestive heart failure. The patient's mother died at the age of 44 following a stroke. The patient had one brother or sisters. One of her sisters was diagnosed with breast cancer at the age of 49, and died 2 years later. A niece from a different sister was diagnosed with breast cancer in her early 77s. There is no history of ovarian cancer in the family.  GYNECOLOGIC HISTORY:  Menarche age 68. The patient underwent hysterectomy and bilateral salpingo-oophorectomy in her mid 49s. She took hormone replacement for more than 30 years, stopping around age 61. She is GX P0.   SOCIAL HISTORY:  Jordan Byrd used to work in a [employer]. She is a widow, and lives alone, with no pets. She has 3 stepchildren, Jordan Byrd, who lives in [place] and is a retired Therapist, music, Jordan Byrd who lives in [place] and is retired from [employer] and Jordan Byrd lives in  [place] and is a retired Engineer, structural. The patient's grandson Jordan Byrd was present at the time of her intake visit. He is disabled secondary to spinal problems. He used to work in Architect    ADVANCED DIRECTIVES: The patient has a living will in place but not healthcare power of attorney. She was given the appropriate documents to complete a healthcare power of attorney and she intends to name her grandson [name] as Special educational needs teacher of attorney. He can be reached at Tina: History  Substance Use Topics  . Smoking status: Former Smoker -- 21 years    Types: Cigarettes    Quit date: 03/17/1970  . Smokeless tobacco: Never Used  . Alcohol Use: No  Colonoscopy:2015   GYF:[facility] post hysterectomy  Bone density:2008, normal  Lipid panel:  Allergies  Allergen Reactions  . Methocarbamol Hives  . Vicodin [Hydrocodone-Acetaminophen] Other (See Comments)    Interacts with bp medication and drops blood pressure  . Aspirin Nausea And Vomiting  . Butazolidin [Phenylbutazone] Other (See Comments)    unknown  . Celebrex [Celecoxib] Nausea And Vomiting    Nausea and vomiting  . Codeine Nausea And Vomiting  . Doripenem Other (See Comments)    unknown  . Aspirin Buf(Alhyd-Mghyd-Cacar) Nausea And Vomiting  . Temazepam Other (See Comments)    unknown    Current Outpatient Prescriptions  Medication Sig Dispense Refill  . acetaminophen (TYLENOL) 325 MG tablet Take 650 mg by mouth 2 (two) times daily as needed (temp above 102).       Marland Kitchen albuterol (PROVENTIL HFA;VENTOLIN HFA) 108 (90 BASE) MCG/ACT inhaler Inhale 1 puff into the lungs every 6 (six) hours as needed for wheezing.       Marland Kitchen amLODipine (NORVASC) 10 MG tablet Take 10 mg by mouth daily.      . budesonide-formoterol (SYMBICORT) 160-4.5 MCG/ACT inhaler Inhale 1 puff into the lungs 2 (two) times daily.       . chlorzoxazone (PARAFON) 500 MG tablet Take 250 mg by mouth 3 (three) times daily as needed for  muscle spasms.      . flecainide (TAMBOCOR) 100 MG tablet Take 1 tablet (100 mg total) by mouth every 12 (twelve) hours.  180 tablet  3  . furosemide (LASIX) 20 MG tablet Take 60 mg by mouth.       . Glucosamine-Chondroit-Vit C-Mn (GLUCOSAMINE CHONDR 1500 COMPLX PO) Take 1 tablet by mouth daily.      . IRON PO Liquid Iron - Take 5 mg twice a day      . Multiple Vitamin (MULTIVITAMINS PO) Take 1 tablet by mouth daily.      . Multiple Vitamins-Minerals (ICAPS PO) Take 1 tablet by mouth 2 (two) times daily.      . nebivolol (BYSTOLIC) 10 MG tablet Take 20 mg by mouth daily.      Marland Kitchen omeprazole (PRILOSEC) 20 MG capsule Take 20 mg by mouth daily.      . Rivaroxaban (XARELTO) 15 MG TABS tablet Take 1 tablet (15 mg total) by mouth daily.  90 tablet  3  . traMADol (ULTRAM) 50 MG tablet Take 1-2 tablets (50-100 mg total) by mouth every 6 (six) hours as needed.  10 tablet  1   No current facility-administered medications for this visit.    OBJECTIVE: Elderly white woman examined in the chair (could not get to the examination table without difficulty) Filed Vitals:   10/06/13 1034  BP: 167/60  Pulse: 62  Temp: 97.8 F (36.6 C)  Resp: 18     Body mass index is 39.9 kg/(m^2).    ECOG FS:2 - Symptomatic, <50% confined to bed  Ocular: Sclerae unicteric, EOMs intact  Ear-nose-throat: Oropharynx clear and moist Lymphatic: No cervical or supraclavicular adenopathy Lungs no rales or rhonchi Heart irregular rate and rhythm Abd soft,  obese, nontender, positive bowel sounds MSK scoliosis but  no focal spinal tenderness,  bilateral lower extremity edema, chronic  Neuro: non-focal, well-oriented, positive affect Breasts: The right breast is unremarkable. The left breast is status post lumpectomy. The incisions are healing nicely.. There are no skin or nipple changes of concern. The left axilla is benign.   LAB RESULTS:  CMP     Component Value  Date/Time   NA 143 07/14/2013 0840   NA 145 06/01/2013  0818   K 4.3 07/14/2013 0840   K 3.7 06/01/2013 0818   CL 102 07/14/2013 0840   CO2 26 07/14/2013 0840   CO2 27 06/01/2013 0818   GLUCOSE 105* 07/14/2013 0840   GLUCOSE 94 06/01/2013 0818   BUN 29* 07/14/2013 0840   BUN 36.1* 06/01/2013 0818   CREATININE 1.25* 07/14/2013 0840   CREATININE 1.4* 06/01/2013 0818   CALCIUM 9.4 07/14/2013 0840   CALCIUM 10.1 06/01/2013 0818   PROT 6.9 06/01/2013 0818   PROT 6.8 07/12/2012 1524   ALBUMIN 4.0 06/01/2013 0818   ALBUMIN 3.3* 07/12/2012 1524   AST 15 06/01/2013 0818   AST 11 07/12/2012 1524   ALT 12 06/01/2013 0818   ALT 16 07/12/2012 1524   ALKPHOS 87 06/01/2013 0818   ALKPHOS 77 07/12/2012 1524   BILITOT 0.30 06/01/2013 0818   BILITOT 0.5 07/12/2012 1524   GFRNONAA 40* 07/14/2013 0840   GFRAA 47* 07/14/2013 0840    I No results found for this basename: SPEP,  UPEP,   kappa and lambda light chains    Lab Results  Component Value Date   WBC 8.2 07/14/2013   NEUTROABS 5.6 06/01/2013   HGB 13.3 07/14/2013   HCT 42.2 07/14/2013   MCV 93.2 07/14/2013   PLT 255 07/14/2013      Chemistry      Component Value Date/Time   NA 143 07/14/2013 0840   NA 145 06/01/2013 0818   K 4.3 07/14/2013 0840   K 3.7 06/01/2013 0818   CL 102 07/14/2013 0840   CO2 26 07/14/2013 0840   CO2 27 06/01/2013 0818   BUN 29* 07/14/2013 0840   BUN 36.1* 06/01/2013 0818   CREATININE 1.25* 07/14/2013 0840   CREATININE 1.4* 06/01/2013 0818      Component Value Date/Time   CALCIUM 9.4 07/14/2013 0840   CALCIUM 10.1 06/01/2013 0818   ALKPHOS 87 06/01/2013 0818   ALKPHOS 77 07/12/2012 1524   AST 15 06/01/2013 0818   AST 11 07/12/2012 1524   ALT 12 06/01/2013 0818   ALT 16 07/12/2012 1524   BILITOT 0.30 06/01/2013 0818   BILITOT 0.5 07/12/2012 1524       No results found for this basename: LABCA2    No components found with this basename: [id number]    No results found for this basename: INR,  in the last 168 hours  Urinalysis    Component Value Date/Time   COLORURINE YELLOW 09/19/2012 [place] 09/19/2012 2302   LABSPEC 1.024 09/19/2012 2302   PHURINE 5.5 09/19/2012 [place] 09/19/2012 2302   HGBUR NEGATIVE 09/19/2012 2302   BILIRUBINUR NEGATIVE 09/19/2012 2302   KETONESUR NEGATIVE 09/19/2012 2302   PROTEINUR NEGATIVE 09/19/2012 2302   UROBILINOGEN 0.2 09/19/2012 2302   NITRITE POSITIVE* 09/19/2012 2302   LEUKOCYTESUR SMALL* 09/19/2012 2302    STUDIES: CLINICAL DATA: Status post lumpectomy for invasive ductal carcinoma  and ductal carcinoma in situ left breast. Margins were negative.  EXAM:  DIGITAL DIAGNOSTIC LEFT MAMMOGRAM WITH CAD  COMPARISON: 07/14/2013 and earlier  ACR Breast Density Category c: The breast tissue is heterogeneously  dense, which may obscure small masses.  FINDINGS:  Postoperative seroma/ hematoma identified in the upper-outer  quadrant of the left breast. Multiple benign appearing  calcifications are identified throughout the breast. The group of  linear calcifications previously noted in the upper-outer quadrant  of the  left breast is no longer apparent.  Mammographic images were processed with CAD.  IMPRESSION:  Benign postoperative changes. No mammographic evidence for  malignancy.  RECOMMENDATION:  Diagnostic mammogram is recommended in February 2016.  I have discussed the findings and recommendations with the patient.  Results were also provided in writing at the conclusion of the  visit. If applicable, a reminder letter will be sent to the patient  regarding the next appointment.  BI-RADS CATEGORY 2: Benign.  Electronically Signed  By: Shon Hale M.D.  On: 07/29/2013 08:54    ASSESSMENT: 80 y.o. BRCA negative [place] woman status post left breast biopsy 05/19/2013 for a clinical T2 N0, stage IIA invasive ductal carcinoma, grade 1, estrogen and progesterone receptor both strongly positive, with an MIB-1 of 6%, and no HER-2 amplification  (1) status post left lumpectomy 07/14/2013 for ductal carcinoma in situ,  grade 1, with negative margins  (2) the patient was offered adjuvant radiation June 2015, declined.  (3) aromatase inhibitors discussed with the patient September 2015; declined  PLAN: I reviewed the patient's pathology with her and her grandson. She understands her final pathology showed only noninvasive disease, but the original pathology did show invasive disease. When I put the information into the adjuvant! Program he tells me that she has a 12% risk of relapse and a 4% risk of dying from this cancer. She can reduce that risk with aromatase inhibitors, the risk of dying by about 1%, the risk of recurrence by about 5%.  We then discussed the possible toxicities side effects and complications of anastrozole. She understands the cost of this medication is generally very low and that many patients tolerated without any significant side effects.  After much discussion she decided she would prefer not to take "and other pill". Given her overall comorbidities I think this is not unreasonable. Accordingly we will follow her off treatment, with observation alone.  Her next mammogram will be in February. Accordingly she will see me again in March. We will start seeing her on a once a year schedule thereafter until she completes her 5 years of followup.  The patient and her grandson have a good understanding of this plan. They agree with it.. They will call with any problems that may develop before the next visit here.  Chauncey Cruel, MD   10/06/2013 11:17 AM

## 2013-10-06 NOTE — Telephone Encounter (Signed)
per pof to sch appt-sch & gave pt copy of sch

## 2013-10-07 NOTE — Addendum Note (Signed)
Addended by: Laureen Abrahams on: 10/07/2013 05:54 PM   Modules accepted: Medications

## 2013-10-24 ENCOUNTER — Encounter: Payer: Self-pay | Admitting: Internal Medicine

## 2013-10-24 ENCOUNTER — Ambulatory Visit (INDEPENDENT_AMBULATORY_CARE_PROVIDER_SITE_OTHER): Payer: Medicare Other | Admitting: Internal Medicine

## 2013-10-24 VITALS — BP 138/72 | HR 54 | Ht 66.0 in | Wt 246.4 lb

## 2013-10-24 DIAGNOSIS — I10 Essential (primary) hypertension: Secondary | ICD-10-CM

## 2013-10-24 DIAGNOSIS — N183 Chronic kidney disease, stage 3 unspecified: Secondary | ICD-10-CM

## 2013-10-24 DIAGNOSIS — I5032 Chronic diastolic (congestive) heart failure: Secondary | ICD-10-CM

## 2013-10-24 DIAGNOSIS — I480 Paroxysmal atrial fibrillation: Secondary | ICD-10-CM

## 2013-10-24 DIAGNOSIS — R0602 Shortness of breath: Secondary | ICD-10-CM

## 2013-10-24 DIAGNOSIS — I4891 Unspecified atrial fibrillation: Secondary | ICD-10-CM

## 2013-10-24 MED ORDER — RIVAROXABAN 20 MG PO TABS: 20.0000 mg | ORAL_TABLET | Freq: Every day | ORAL | Status: DC

## 2013-10-24 NOTE — Progress Notes (Signed)
PCP:  [name], MD  The patient presents today for routine electrophysiology followup.  She is doing very well presently.  Her afib appears to be controlled.  She has stable exertional SOB.  She has made very little progress with weight reduction.  Today, she denies symptoms of palpitations, orthopnea, PND, lower extremity edema (above baseline), dizziness, presyncope, syncope, or neurologic sequela.   The patient feels that she is tolerating medications without difficulties and is otherwise without complaint today.   Past Medical History  Diagnosis Date  . Asthma   . Arthritis   . Hypertension   . Atrial fibrillation     a. 04/04/11  . Osteoarthritis     a. 03/28/11 - R Total Hip Arthroplasty  . Cataracts, both eyes   . Pneumonia   . Bronchitis   . Pleurisy   . Ankle swelling   . Kidney infection   . Frequent urination   . Excessive urination at night   . Weight gain   . CHF (congestive heart failure)   . Shortness of breath     doe-  . Anemia   . Anxiety     pt denies this  . GERD (gastroesophageal reflux disease)     [name] Prilosec  . Chronic headaches     no longer having headaches  . Macular degeneration   . Breast cancer 05/19/13    left breast stage IIA    Past Surgical History  Procedure Laterality Date  . Cardiac catheterization      1980's or 90's - reportedly normal  . Abdominal hysterectomy  1962  . Appendectomy  1954  . Cholecystectomy    . Back surgery  1990  . Cataract extraction bilateral w/ anterior vitrectomy  2007/2008  . Total hip arthroplasty  03/28/2011    Procedure: TOTAL HIP ARTHROPLASTY;  Surgeon: Yvette Rack., MD;  Location: [facility];  Service: Orthopedics;  Laterality: Right;  . Cardioversion  04/05/2011    Procedure: CARDIOVERSION;  Surgeon: Coralyn Mark, MD;  Location: [facility];  Service: Cardiovascular;  Laterality: N/A;  . Tonsillectomy  1943  . Adenoidectomy  1949  . Foot surgery  1947  . Thoracic disc surgery  2008  . Lumbar disc  surgery  2010  . Knee surgery       both knees  . Shoulder surgery      left  . Total hip arthroplasty  03/28/2011    right  . Cardioversion N/A 06/19/2012    Procedure: CARDIOVERSION-bedside(3W36);  Surgeon: Lelon Perla, MD;  Location: [facility];  Service: Cardiovascular;  Laterality: N/A;  . Colonoscopy    . Breast lumpectomy with needle localization Left 07/14/2013    Procedure: BREAST LUMPECTOMY WITH NEEDLE LOCALIZATION;  Surgeon: Stark Klein, MD;  Location: [facility];  Service: General;  Laterality: Left;    Current Outpatient Prescriptions  Medication Sig Dispense Refill  . acetaminophen (TYLENOL) 500 MG tablet Take 1,000 mg by mouth 2 (two) times daily.      Marland Kitchen albuterol (PROVENTIL HFA;VENTOLIN HFA) 108 (90 BASE) MCG/ACT inhaler Inhale 1 puff into the lungs every 6 (six) hours as needed for wheezing.       Marland Kitchen amLODipine (NORVASC) 10 MG tablet Take 10 mg by mouth daily.      . budesonide-formoterol (SYMBICORT) 160-4.5 MCG/ACT inhaler Inhale 1 puff into the lungs 2 (two) times daily.       . chlorzoxazone (PARAFON) 500 MG tablet Take 250 mg by mouth 3 (three)  times daily as needed for muscle spasms.      . flecainide (TAMBOCOR) 100 MG tablet Take 1 tablet (100 mg total) by mouth every 12 (twelve) hours.  180 tablet  3  . furosemide (LASIX) 20 MG tablet Take 20 mg by mouth daily.       . Glucosamine-Chondroit-Vit C-Mn (GLUCOSAMINE CHONDR 1500 COMPLX PO) Take 1 tablet by mouth daily.      . IRON PO Liquid Iron - Take 1 teaspoon a day in the mornings      . losartan (COZAAR) 50 MG tablet Take 50 mg by mouth.       . Multiple Vitamin (MULTIVITAMINS PO) Take 1 tablet by mouth daily.      . Multiple Vitamins-Minerals (ICAPS PO) Take 1 tablet by mouth 2 (two) times daily.      . nebivolol (BYSTOLIC) 10 MG tablet Take 20 mg by mouth daily.      Marland Kitchen omeprazole (PRILOSEC) 20 MG capsule Take 20 mg by mouth daily.      . Rivaroxaban (XARELTO) 20 MG TABS tablet Take 1 tablet (20 mg total) by mouth  daily.  90 tablet  3   No current facility-administered medications for this visit.   ROS- all systems are reviewed and negative except as per HPI above  Allergies  Allergen Reactions  . Methocarbamol Hives  . Vicodin [Hydrocodone-Acetaminophen] Other (See Comments)    Interacts with bp medication and drops blood pressure  . Aspirin Nausea And Vomiting  . Butazolidin [Phenylbutazone] Other (See Comments)    unknown  . Celebrex [Celecoxib] Nausea And Vomiting    Nausea and vomiting  . Codeine Nausea And Vomiting  . Doripenem Other (See Comments)    unknown  . Aspirin Buf(Alhyd-Mghyd-Cacar) Nausea And Vomiting  . Temazepam Other (See Comments)    unknown    History   Social History  . Marital Status: Widowed    Spouse Name: N/A    Number of Children: N/A  . Years of Education: N/A   Occupational History  . Retired    Social History Main Topics  . Smoking status: Former Smoker -- 21 years    Types: Cigarettes    Quit date: 03/17/1970  . Smokeless tobacco: Never Used  . Alcohol Use: No  . Drug Use: No  . Sexual Activity: Not Currently   Other Topics Concern  . Not on file   Social History Narrative   Lives alone in [place].  Widowed in 2012 (husband w/ dementia & parkinsons)..   Not active, does not drive due to vision problems.    Family History  Problem Relation Age of Onset  . Diabetes Father     Father, Mother, 4 sisters (2 living)  . Heart attack Father     (Deceased)  . Heart failure Father     (deceased 13)  . Hypertension Father   . Stroke Mother     (deceased 19)  . Hypertension Mother   . Cancer Sister 67    breast cancer  . Heart attack Sister   . Diabetes Sister   . Cancer Other 52    niece with breast cancer   Physical Exam: Filed Vitals:   10/24/13 0948  BP: 138/72  Pulse: 54  Height: 5\' 6"  (1.676 m)  Weight: 246 lb 6.4 oz (111.766 kg)    GEN- The patient is overweight and chronically ill appearing, alert and oriented x 3 today.    Head- normocephalic, atraumatic Eyes-  Sclera clear, conjunctiva  pink Ears- hearing intact Oropharynx- clear Neck- supple,  JVP 6cm Lungs- Clear to ausculation bilaterally, normal work of breathing Heart- Regular rate and rhythm, no murmurs, rubs or gallops, PMI not laterally displaced GI- soft, NT, ND, + BS Extremities- no clubbing, cyanosis, +1edema MS- no significant deformity or atrophy Skin- no rash or lesion Psych- euthymic mood, full affect Neuro- strength and sensation are intact  ekg today reveals sinus rhythm 54 bpm, PR 228, incomplete LBBB  Assessment and Plan:  1. afib Controlled CrCl >50, will therefore increase xarelto to 20mg  daily  2. Chronic diastolic dysfunction.  Much improved.  No changes today  3. CRI Stable Continue to follow with nephrology  4. HTN Stable No change required today  5. Exertional SOB  Prior myoview was low risk and echo reveals preserved EF Will obtain PFTs to consider pulmonary cause Weight reduction and regular exercise are encouraged   Return to see Cecille Rubin in 6 months I will see in 12 months

## 2013-10-24 NOTE — Patient Instructions (Signed)
Your physician wants you to follow-up in: 6 months with Truitt Merle, NP and 12 months with Dr. Vallery Ridge will receive a reminder letter in the mail two months in advance. If you don't receive a letter, please call our office to schedule the follow-up appointment.  Your physician has recommended that you have a pulmonary function test. Pulmonary Function Tests are a group of tests that measure how well air moves in and out of your lungs.

## 2013-10-26 ENCOUNTER — Ambulatory Visit (INDEPENDENT_AMBULATORY_CARE_PROVIDER_SITE_OTHER): Payer: Medicare Other | Admitting: Internal Medicine

## 2013-10-26 DIAGNOSIS — I4891 Unspecified atrial fibrillation: Secondary | ICD-10-CM

## 2013-10-26 DIAGNOSIS — R0602 Shortness of breath: Secondary | ICD-10-CM

## 2013-10-26 DIAGNOSIS — I480 Paroxysmal atrial fibrillation: Secondary | ICD-10-CM

## 2013-10-26 LAB — PULMONARY FUNCTION TEST
DL/VA % pred: 93 %
DL/VA: 4.7 ml/min/mmHg/L
DLCO unc % pred: 71 %
DLCO unc: 19.08 ml/min/mmHg
FEF 25-75 Post: 0.65 L/sec
FEF 25-75 Pre: 0.53 L/sec
FEF2575-%CHANGE-POST: 22 %
FEF2575-%Pred-Post: 40 %
FEF2575-%Pred-Pre: 33 %
FEV1-%CHANGE-POST: 1 %
FEV1-%Pred-Post: 59 %
FEV1-%Pred-Pre: 58 %
FEV1-POST: 1.3 L
FEV1-Pre: 1.28 L
FEV1FVC-%CHANGE-POST: 1 %
FEV1FVC-%Pred-Pre: 79 %
FEV6-%Change-Post: 1 %
FEV6-%PRED-POST: 78 %
FEV6-%Pred-Pre: 77 %
FEV6-Post: 2.17 L
FEV6-Pre: 2.14 L
FEV6FVC-%Change-Post: 1 %
FEV6FVC-%PRED-PRE: 103 %
FEV6FVC-%Pred-Post: 104 %
FVC-%Change-Post: 0 %
FVC-%Pred-Post: 75 %
FVC-%Pred-Pre: 75 %
FVC-POST: 2.18 L
FVC-Pre: 2.18 L
POST FEV1/FVC RATIO: 60 %
POST FEV6/FVC RATIO: 100 %
Pre FEV1/FVC ratio: 59 %
Pre FEV6/FVC Ratio: 98 %
RV % pred: 122 %
RV: 2.98 L
TLC % pred: 99 %
TLC: 5.28 L

## 2013-10-26 NOTE — Progress Notes (Signed)
PFT done today. 

## 2013-11-18 ENCOUNTER — Ambulatory Visit (INDEPENDENT_AMBULATORY_CARE_PROVIDER_SITE_OTHER): Payer: Medicare Other | Admitting: General Surgery

## 2013-12-01 ENCOUNTER — Other Ambulatory Visit: Payer: Self-pay | Admitting: *Deleted

## 2013-12-01 DIAGNOSIS — G4730 Sleep apnea, unspecified: Secondary | ICD-10-CM

## 2013-12-05 ENCOUNTER — Encounter: Payer: Self-pay | Admitting: Internal Medicine

## 2013-12-25 ENCOUNTER — Telehealth: Payer: Self-pay | Admitting: Nurse Practitioner

## 2013-12-25 NOTE — Telephone Encounter (Signed)
Patient's grandson called this afternoon to report that after church Ms. Littrell c/o not feeling well - c/p and dyspnea.  BP 170's.  HR 61. No palpitations.  I advised that if she is feeling poorly, she will need evaluation today at either urgent care or ER.  If she is having active c/p, she will need to call 911 for transport to the ED.  Grandson verbalized understanding.

## 2014-02-02 ENCOUNTER — Other Ambulatory Visit: Payer: Self-pay | Admitting: Cardiology

## 2014-02-02 ENCOUNTER — Telehealth: Payer: Self-pay | Admitting: Cardiology

## 2014-02-02 NOTE — Telephone Encounter (Signed)
Pt out of xarelto, has been receiving samples from the office, I called in 10 tabs of 20 mg to [facility] [address], but hey are only allowed to dispense 30 tabs.  So I ordered 30 tabs.

## 2014-02-07 ENCOUNTER — Institutional Professional Consult (permissible substitution): Payer: Medicare Other | Admitting: Internal Medicine

## 2014-02-23 ENCOUNTER — Other Ambulatory Visit: Payer: Self-pay | Admitting: General Surgery

## 2014-02-23 ENCOUNTER — Telehealth: Payer: Self-pay | Admitting: Internal Medicine

## 2014-02-23 ENCOUNTER — Other Ambulatory Visit: Payer: Self-pay | Admitting: Oncology

## 2014-02-23 DIAGNOSIS — Z853 Personal history of malignant neoplasm of breast: Secondary | ICD-10-CM

## 2014-02-23 DIAGNOSIS — I480 Paroxysmal atrial fibrillation: Secondary | ICD-10-CM

## 2014-02-23 MED ORDER — RIVAROXABAN 20 MG PO TABS: 20.0000 mg | ORAL_TABLET | Freq: Every day | ORAL | Status: DC

## 2014-02-23 NOTE — Telephone Encounter (Signed)
New Msg       Pt c/o medication issue:  1. Name of Medication: Xarelto 20 mg   2. How are you currently taking this medication (dosage and times per day)? 20 mg 1 day  3. Are you having a reaction (difficulty breathing--STAT)? No   4. What is your medication issue? Pt would like a new prescription written for 90 days instead of 30 days  Pt uses  [facility].

## 2014-02-23 NOTE — Telephone Encounter (Signed)
Left detailed message on the pts confirmed voicemail for her to call back with any additional questions regarding wanting her Xarelto sent to [facility] for a 90 day supply.  Sent pts request for the Xarelto, 90 day supply to [facility].

## 2014-03-07 ENCOUNTER — Encounter: Payer: Self-pay | Admitting: Internal Medicine

## 2014-03-07 ENCOUNTER — Ambulatory Visit (INDEPENDENT_AMBULATORY_CARE_PROVIDER_SITE_OTHER): Payer: PPO | Admitting: Internal Medicine

## 2014-03-07 VITALS — BP 140/68 | HR 73 | Ht 66.0 in | Wt 226.0 lb

## 2014-03-07 DIAGNOSIS — J449 Chronic obstructive pulmonary disease, unspecified: Secondary | ICD-10-CM

## 2014-03-07 DIAGNOSIS — G478 Other sleep disorders: Secondary | ICD-10-CM

## 2014-03-07 DIAGNOSIS — G4730 Sleep apnea, unspecified: Secondary | ICD-10-CM

## 2014-03-07 DIAGNOSIS — R0602 Shortness of breath: Secondary | ICD-10-CM

## 2014-03-07 MED ORDER — TIOTROPIUM BROMIDE MONOHYDRATE 1.25 MCG/ACT IN AERS
2.0000 | INHALATION_SPRAY | Freq: Every day | RESPIRATORY_TRACT | Status: DC
Start: 2014-03-07 — End: 2014-05-13

## 2014-03-07 NOTE — Patient Instructions (Addendum)
Order- Walk test on room air for oximetry       Dx dyspnea on exertion, COPD/E             ONOX room air      Sample trial Spiriva Respimat   2 puffs, once daily

## 2014-03-07 NOTE — Progress Notes (Signed)
03/07/14- 81 yoF former smoker referred courtesy of Dr Rayann Heman for sleep medicine evaluation. Son here She says she sleeps "fine" except for nocturia every 2 hours. Widowed, not aware of snoring. Admits some daytime sleepiness. Smoked for 21 years, quitting at age 81. History of intermittent asthma without rhinitis. Had pneumonia last year. Sometimes wheezes but denies coughing. Using Symbicort 2 puffs, once daily and rare need for rescue inhaler.  Dyspnea on exertion has increased in the past year-walking one or 2 rooms, making bed. Weight increased.. Feet swell if she sits a lot. Cardiology follows for atrial fibrillation/Xarelto She had worked in a [place] with incidental dust. PFT 10/26/13- Moderately severe obstructive disease, no response, possible restriction, mild Diffusion reduction.FVC 2.18/ 75%, FEV1 1.30/ 59%, FEV1/FVC 0.60,  DLCO 71%, TLC 99% CXR 08/15/13- IMPRESSION: No acute cardiopulmonary disease. No explanation for left-sided chest pain. Electronically Signed  By: Abigail Miyamoto M.D.  On: 08/15/2013  Prior to Admission medications   Medication Sig Start Date End Date Taking? Authorizing Provider  acetaminophen (TYLENOL) 500 MG tablet Take 1,000 mg by mouth 2 (two) times daily.   Yes Historical Provider, MD  albuterol (PROVENTIL HFA;VENTOLIN HFA) 108 (90 BASE) MCG/ACT inhaler Inhale 1 puff into the lungs every 6 (six) hours as needed for wheezing.    Yes Historical Provider, MD  amLODipine (NORVASC) 10 MG tablet Take 10 mg by mouth daily.   Yes Historical Provider, MD  budesonide-formoterol (SYMBICORT) 160-4.5 MCG/ACT inhaler Inhale 1 puff into the lungs 2 (two) times daily.    Yes Historical Provider, MD  chlorzoxazone (PARAFON) 500 MG tablet Take 250 mg by mouth 3 (three) times daily as needed for muscle spasms.   Yes Historical Provider, MD  flecainide (TAMBOCOR) 100 MG tablet Take 1 tablet (100 mg total) by mouth every 12 (twelve) hours. 11/11/12  Yes Thompson Grayer, MD   furosemide (LASIX) 20 MG tablet Take 20 mg by mouth daily.  09/22/12  Yes Thurnell Lose, MD  IRON PO Liquid Iron - Take 1 teaspoon a day in the mornings   Yes Historical Provider, MD  losartan (COZAAR) 50 MG tablet Take 50 mg by mouth.  10/04/13  Yes Historical Provider, MD  Multiple Vitamin (MULTIVITAMINS PO) Take 1 tablet by mouth daily.   Yes Historical Provider, MD  Multiple Vitamins-Minerals (ICAPS PO) Take 1 tablet by mouth 2 (two) times daily.   Yes Historical Provider, MD  nebivolol (BYSTOLIC) 10 MG tablet Take 20 mg by mouth daily.   Yes Historical Provider, MD  omeprazole (PRILOSEC) 20 MG capsule Take 20 mg by mouth daily.   Yes Historical Provider, MD  rivaroxaban (XARELTO) 20 MG TABS tablet Take 1 tablet (20 mg total) by mouth daily. 02/23/14  Yes Thompson Grayer, MD  Tiotropium Bromide Monohydrate (SPIRIVA RESPIMAT) 1.25 MCG/ACT AERS Inhale 2 puffs into the lungs daily. 03/07/14   Deneise Lever, MD   Past Medical History  Diagnosis Date  . Asthma   . Arthritis   . Hypertension   . Atrial fibrillation     a. 04/04/11  . Osteoarthritis     a. 03/28/11 - R Total Hip Arthroplasty  . Cataracts, both eyes   . Pneumonia   . Bronchitis   . Pleurisy   . Ankle swelling   . Kidney infection   . Frequent urination   . Excessive urination at night   . Weight gain   . CHF (congestive heart failure)   . Shortness of breath  doe-  . Anemia   . Anxiety     pt denies this  . GERD (gastroesophageal reflux disease)     [name] Prilosec  . Chronic headaches     no longer having headaches  . Macular degeneration   . Breast cancer 05/19/13    left breast stage IIA    Past Surgical History  Procedure Laterality Date  . Cardiac catheterization      1980's or 90's - reportedly normal  . Abdominal hysterectomy  1962  . Appendectomy  1954  . Cholecystectomy    . Back surgery  1990  . Cataract extraction bilateral w/ anterior vitrectomy  2007/2008  . Total hip arthroplasty  03/28/2011     Procedure: TOTAL HIP ARTHROPLASTY;  Surgeon: Yvette Rack., MD;  Location: [facility];  Service: Orthopedics;  Laterality: Right;  . Cardioversion  04/05/2011    Procedure: CARDIOVERSION;  Surgeon: Coralyn Mark, MD;  Location: [facility];  Service: Cardiovascular;  Laterality: N/A;  . Tonsillectomy  1943  . Adenoidectomy  1949  . Foot surgery  1947  . Thoracic disc surgery  2008  . Lumbar disc surgery  2010  . Knee surgery       both knees  . Shoulder surgery      left  . Total hip arthroplasty  03/28/2011    right  . Cardioversion N/A 06/19/2012    Procedure: CARDIOVERSION-bedside(3W36);  Surgeon: Lelon Perla, MD;  Location: [facility];  Service: Cardiovascular;  Laterality: N/A;  . Colonoscopy    . Breast lumpectomy with needle localization Left 07/14/2013    Procedure: BREAST LUMPECTOMY WITH NEEDLE LOCALIZATION;  Surgeon: Stark Klein, MD;  Location: [facility];  Service: General;  Laterality: Left;   Family History  Problem Relation Age of Onset  . Diabetes Father     Father, Mother, 4 sisters (2 living)  . Heart attack Father     (Deceased)  . Heart failure Father     (deceased 43)  . Hypertension Father   . Stroke Mother     (deceased 22)  . Hypertension Mother   . Cancer Sister 53    breast cancer  . Heart attack Sister   . Diabetes Sister   . Cancer Other 12    niece with breast cancer   History   Social History  . Marital Status: Widowed    Spouse Name: N/A    Number of Children: N/A  . Years of Education: N/A   Occupational History  . Retired    Social History Main Topics  . Smoking status: Former Smoker -- 21 years    Types: Cigarettes    Quit date: 03/17/1970  . Smokeless tobacco: Never Used  . Alcohol Use: No  . Drug Use: No  . Sexual Activity: Not Currently   Other Topics Concern  . Not on file   Social History Narrative   Lives alone in [place].  Widowed in 2012 (husband w/ dementia & parkinsons)..   Not active, does not drive due to vision problems.    ROS-see HPI Constitutional:   No-   weight loss, night sweats, fevers, chills, fatigue, lassitude. HEENT:   No-  headaches, difficulty swallowing, tooth/dental problems, sore throat,       No-  sneezing, itching, ear ache, nasal congestion, post nasal drip,  CV:  No-   chest pain, orthopnea, PND, swelling in lower extremities, anasarca,  dizziness, +palpitations Resp: + shortness of breath with exertion or at rest.              No-   productive cough,  No non-productive cough,  No- coughing up of blood.              No-   change in color of mucus.  + wheezing.   Skin: No-   rash or lesions. GI:  No-   heartburn, indigestion, abdominal pain, nausea, vomiting, diarrhea,                 change in bowel habits, loss of appetite GU: No-   dysuria, change in color of urine, no urgency or frequency.  No- flank pain. MS:  + joint pain or swelling.  No- decreased range of motion.  No- back pain. Neuro-     nothing unusual Psych:  No- change in mood or affect. No depression or anxiety.  No memory loss.  OBJ- Physical Exam + obese General- Alert, Oriented, Affect-appropriate, Distress- none acute Skin- rash-none, lesions- none, excoriation- none Lymphadenopathy- none Head- atraumatic            Eyes- Gross vision intact, PERRLA, conjunctivae and secretions clear            Ears- Hearing, canals-normal            Nose- Clear, no-Septal dev, mucus, polyps, erosion, perforation             Throat- Mallampati II , mucosa clear , drainage- none, tonsils- atrophic Neck- flexible , trachea midline, no stridor , thyroid nl, carotid no bruit Chest - symmetrical excursion , unlabored           Heart/CV- RRR , no murmur , no gallop  , no rub, nl s1 s2                           - JVD- none , edema+1, stasis changes- none, varices- none           Lung- clear to P&A, wheeze- none, cough- none , dullness-none, rub- none           Chest wall-  Abd- tender-no, distended-no,  bowel sounds-present, HSM- no Br/ Gen/ Rectal- Not done, not indicated Extrem- cyanosis- none, clubbing, none, atrophy- none, strength- nl Neuro- grossly intact to observation

## 2014-03-11 ENCOUNTER — Emergency Department (HOSPITAL_COMMUNITY)
Admission: EM | Admit: 2014-03-11 | Discharge: 2014-03-12 | Disposition: A | Discharge status: Home / Self Care | Payer: PPO | Attending: Emergency Medicine | Admitting: Emergency Medicine

## 2014-03-11 ENCOUNTER — Emergency Department (HOSPITAL_COMMUNITY): Payer: PPO

## 2014-03-11 ENCOUNTER — Encounter (HOSPITAL_COMMUNITY): Payer: Self-pay | Admitting: *Deleted

## 2014-03-11 ENCOUNTER — Telehealth: Payer: Self-pay | Admitting: Pulmonary Disease

## 2014-03-11 DIAGNOSIS — Z7901 Long term (current) use of anticoagulants: Secondary | ICD-10-CM | POA: Insufficient documentation

## 2014-03-11 DIAGNOSIS — Z862 Personal history of diseases of the blood and blood-forming organs and certain disorders involving the immune mechanism: Secondary | ICD-10-CM | POA: Diagnosis not present

## 2014-03-11 DIAGNOSIS — Z853 Personal history of malignant neoplasm of breast: Secondary | ICD-10-CM | POA: Insufficient documentation

## 2014-03-11 DIAGNOSIS — Z79899 Other long term (current) drug therapy: Secondary | ICD-10-CM | POA: Diagnosis not present

## 2014-03-11 DIAGNOSIS — I509 Heart failure, unspecified: Secondary | ICD-10-CM | POA: Diagnosis not present

## 2014-03-11 DIAGNOSIS — R05 Cough: Secondary | ICD-10-CM | POA: Diagnosis present

## 2014-03-11 DIAGNOSIS — Z8701 Personal history of pneumonia (recurrent): Secondary | ICD-10-CM | POA: Diagnosis not present

## 2014-03-11 DIAGNOSIS — Z87448 Personal history of other diseases of urinary system: Secondary | ICD-10-CM | POA: Diagnosis not present

## 2014-03-11 DIAGNOSIS — G4730 Sleep apnea, unspecified: Secondary | ICD-10-CM | POA: Insufficient documentation

## 2014-03-11 DIAGNOSIS — I10 Essential (primary) hypertension: Secondary | ICD-10-CM | POA: Insufficient documentation

## 2014-03-11 DIAGNOSIS — J45901 Unspecified asthma with (acute) exacerbation: Secondary | ICD-10-CM | POA: Insufficient documentation

## 2014-03-11 DIAGNOSIS — G8929 Other chronic pain: Secondary | ICD-10-CM | POA: Diagnosis not present

## 2014-03-11 DIAGNOSIS — I4891 Unspecified atrial fibrillation: Secondary | ICD-10-CM | POA: Insufficient documentation

## 2014-03-11 DIAGNOSIS — R059 Cough, unspecified: Secondary | ICD-10-CM

## 2014-03-11 DIAGNOSIS — Z87891 Personal history of nicotine dependence: Secondary | ICD-10-CM | POA: Insufficient documentation

## 2014-03-11 DIAGNOSIS — M1990 Unspecified osteoarthritis, unspecified site: Secondary | ICD-10-CM | POA: Insufficient documentation

## 2014-03-11 DIAGNOSIS — R062 Wheezing: Secondary | ICD-10-CM

## 2014-03-11 DIAGNOSIS — F419 Anxiety disorder, unspecified: Secondary | ICD-10-CM | POA: Diagnosis not present

## 2014-03-11 LAB — CBC WITH DIFFERENTIAL/PLATELET
Basophils Absolute: 0 K/uL (ref 0.0–0.1)
Basophils Relative: 0 % (ref 0–1)
Eosinophils Absolute: 0.3 K/uL (ref 0.0–0.7)
Eosinophils Relative: 3 % (ref 0–5)
HCT: 40 % (ref 36.0–46.0)
Hemoglobin: 13 g/dL (ref 12.0–15.0)
Lymphocytes Relative: 21 % (ref 12–46)
Lymphs Abs: 1.6 K/uL (ref 0.7–4.0)
MCH: 30.1 pg (ref 26.0–34.0)
MCHC: 32.5 g/dL (ref 30.0–36.0)
MCV: 92.6 fL (ref 78.0–100.0)
Monocytes Absolute: 0.6 K/uL (ref 0.1–1.0)
Monocytes Relative: 8 % (ref 3–12)
Neutro Abs: 5.2 K/uL (ref 1.7–7.7)
Neutrophils Relative %: 68 % (ref 43–77)
Platelets: 253 K/uL (ref 150–400)
RBC: 4.32 MIL/uL (ref 3.87–5.11)
RDW: 14 % (ref 11.5–15.5)
WBC: 7.7 K/uL (ref 4.0–10.5)

## 2014-03-11 LAB — BRAIN NATRIURETIC PEPTIDE: B Natriuretic Peptide: 94.7 pg/mL (ref 0.0–100.0)

## 2014-03-11 LAB — COMPREHENSIVE METABOLIC PANEL WITH GFR
ALT: 18 U/L (ref 0–35)
AST: 18 U/L (ref 0–37)
Albumin: 3.8 g/dL (ref 3.5–5.2)
Alkaline Phosphatase: 84 U/L (ref 39–117)
Anion gap: 10 (ref 5–15)
BUN: 24 mg/dL — ABNORMAL HIGH (ref 6–23)
CO2: 29 mmol/L (ref 19–32)
Calcium: 9 mg/dL (ref 8.4–10.5)
Chloride: 102 mmol/L (ref 96–112)
Creatinine, Ser: 1.19 mg/dL — ABNORMAL HIGH (ref 0.50–1.10)
GFR calc Af Amer: 49 mL/min — ABNORMAL LOW
GFR calc non Af Amer: 43 mL/min — ABNORMAL LOW
Glucose, Bld: 93 mg/dL (ref 70–99)
Potassium: 4.3 mmol/L (ref 3.5–5.1)
Sodium: 141 mmol/L (ref 135–145)
Total Bilirubin: 0.3 mg/dL (ref 0.3–1.2)
Total Protein: 6.6 g/dL (ref 6.0–8.3)

## 2014-03-11 LAB — TROPONIN I: Troponin I: <0.03 ng/mL

## 2014-03-11 MED ORDER — IPRATROPIUM-ALBUTEROL 0.5-2.5 (3) MG/3ML IN SOLN
3.0000 mL | Freq: Once | RESPIRATORY_TRACT | Status: AC
  Administered 2014-03-11: 3 mL via RESPIRATORY_TRACT
  Filled 2014-03-11: qty 33

## 2014-03-11 MED ORDER — IPRATROPIUM-ALBUTEROL 0.5-2.5 (3) MG/3ML IN SOLN
3.0000 mL | Freq: Once | RESPIRATORY_TRACT | Status: AC
  Administered 2014-03-11: 3 mL via RESPIRATORY_TRACT
  Filled 2014-03-11: qty 3

## 2014-03-11 MED ORDER — ALBUTEROL SULFATE (2.5 MG/3ML) 0.083% IN NEBU
5.0000 mg | INHALATION_SOLUTION | Freq: Once | RESPIRATORY_TRACT | Status: AC
  Administered 2014-03-11: 5 mg via RESPIRATORY_TRACT
  Filled 2014-03-11: qty 6

## 2014-03-11 MED ORDER — IPRATROPIUM BROMIDE 0.02 % IN SOLN
0.5000 mg | Freq: Once | RESPIRATORY_TRACT | Status: AC
  Administered 2014-03-11: 0.5 mg via RESPIRATORY_TRACT
  Filled 2014-03-11: qty 2.5

## 2014-03-11 NOTE — Assessment & Plan Note (Signed)
She doesn't think she has a sleep problem other than frequent nocturia. Before we talk more about a sleep study, we will check overnight oximetry

## 2014-03-11 NOTE — Assessment & Plan Note (Signed)
Moderately severe obstructive airways disease. She is using Symbicort regularly, but only once daily. PFTs don't suggest significant response to bronchodilator. Not clear how much of her dyspnea is attributable to her heart, deconditioning and other factors. Plan-walk test on room air for oximetry, overnight oximetry, sample Spiriva for trial

## 2014-03-11 NOTE — ED Provider Notes (Signed)
CSN: [id number]     Arrival date & time 03/11/14  2143 History   First MD Initiated Contact with Patient 03/11/14 2209     Chief Complaint  Patient presents with  . Cough     (Consider location/radiation/quality/duration/timing/severity/associated sxs/prior Treatment) HPI Comments: Patient with past medical history of asthma, hypertension, A. fib, CHF, chronic bronchitis, presents emergency department with chief complaint of shortness of breath and chest tightness. Patient states the symptoms started early this morning. She has tried using her inhaler with no relief. She is followed by Dr. Annamaria Boots of pulmonology, Dr. Zenia Resides from cardiology, and her PCP is Dr. [name]. She states that she has somewhat frequent exacerbations. She denies any fevers or chills. Denies productive cough. Denies any nausea or vomiting. She does not wear home O2. She states that she has noticed some swelling in her legs.  The history is provided by the patient. No language interpreter was used.    Past Medical History  Diagnosis Date  . Asthma   . Arthritis   . Hypertension   . Atrial fibrillation     a. 04/04/11  . Osteoarthritis     a. 03/28/11 - R Total Hip Arthroplasty  . Cataracts, both eyes   . Pneumonia   . Bronchitis   . Pleurisy   . Ankle swelling   . Kidney infection   . Frequent urination   . Excessive urination at night   . Weight gain   . CHF (congestive heart failure)   . Shortness of breath     doe-  . Anemia   . Anxiety     pt denies this  . GERD (gastroesophageal reflux disease)     [name] Prilosec  . Chronic headaches     no longer having headaches  . Macular degeneration   . Breast cancer 05/19/13    left breast stage IIA    Past Surgical History  Procedure Laterality Date  . Cardiac catheterization      1980's or 90's - reportedly normal  . Abdominal hysterectomy  1962  . Appendectomy  1954  . Cholecystectomy    . Back surgery  1990  . Cataract extraction bilateral w/ anterior  vitrectomy  2007/2008  . Total hip arthroplasty  03/28/2011    Procedure: TOTAL HIP ARTHROPLASTY;  Surgeon: Yvette Rack., MD;  Location: [facility];  Service: Orthopedics;  Laterality: Right;  . Cardioversion  04/05/2011    Procedure: CARDIOVERSION;  Surgeon: Coralyn Mark, MD;  Location: [facility];  Service: Cardiovascular;  Laterality: N/A;  . Tonsillectomy  1943  . Adenoidectomy  1949  . Foot surgery  1947  . Thoracic disc surgery  2008  . Lumbar disc surgery  2010  . Knee surgery       both knees  . Shoulder surgery      left  . Total hip arthroplasty  03/28/2011    right  . Cardioversion N/A 06/19/2012    Procedure: CARDIOVERSION-bedside(3W36);  Surgeon: Lelon Perla, MD;  Location: [facility];  Service: Cardiovascular;  Laterality: N/A;  . Colonoscopy    . Breast lumpectomy with needle localization Left 07/14/2013    Procedure: BREAST LUMPECTOMY WITH NEEDLE LOCALIZATION;  Surgeon: Stark Klein, MD;  [facility];  Service: General;  Laterality: Left;   Family History  Problem Relation Age of Onset  . Diabetes Father     Father, Mother, 4 sisters (2 living)  . Heart attack Father     (Deceased)  .  Heart failure Father     (deceased 37)  . Hypertension Father   . Stroke Mother     (deceased 65)  . Hypertension Mother   . Cancer Sister 26    breast cancer  . Heart attack Sister   . Diabetes Sister   . Cancer Other 68    niece with breast cancer   History  Substance Use Topics  . Smoking status: Former Smoker -- 21 years    Types: Cigarettes    Quit date: 03/17/1970  . Smokeless tobacco: Never Used  . Alcohol Use: No   OB History    Obstetric Comments   Menses age 82 No pregnancies 3 step-children     Review of Systems  Constitutional: Negative for fever and chills.  Respiratory: Positive for cough and shortness of breath.   Cardiovascular: Negative for chest pain.  Gastrointestinal: Negative for nausea, vomiting, diarrhea and constipation.  Genitourinary:  Negative for dysuria.  All other systems reviewed and are negative.     Allergies  Methocarbamol; Vicodin; Aspirin; Butazolidin; Celebrex; Codeine; Doripenem; Aspirin buf(alhyd-mghyd-cacar); and Temazepam  Home Medications   Prior to Admission medications   Medication Sig Start Date End Date Taking? Authorizing Provider  acetaminophen (TYLENOL) 500 MG tablet Take 1,000 mg by mouth 2 (two) times daily.    Historical Provider, MD  albuterol (PROVENTIL HFA;VENTOLIN HFA) 108 (90 BASE) MCG/ACT inhaler Inhale 1 puff into the lungs every 6 (six) hours as needed for wheezing.     Historical Provider, MD  amLODipine (NORVASC) 10 MG tablet Take 10 mg by mouth daily.    Historical Provider, MD  budesonide-formoterol (SYMBICORT) 160-4.5 MCG/ACT inhaler Inhale 1 puff into the lungs 2 (two) times daily.     Historical Provider, MD  chlorzoxazone (PARAFON) 500 MG tablet Take 250 mg by mouth 3 (three) times daily as needed for muscle spasms.    Historical Provider, MD  flecainide (TAMBOCOR) 100 MG tablet Take 1 tablet (100 mg total) by mouth every 12 (twelve) hours. 11/11/12   Thompson Grayer, MD  furosemide (LASIX) 20 MG tablet Take 20 mg by mouth daily.  09/22/12   Thurnell Lose, MD  IRON PO Liquid Iron - Take 1 teaspoon a day in the mornings    Historical Provider, MD  losartan (COZAAR) 50 MG tablet Take 50 mg by mouth.  10/04/13   Historical Provider, MD  Multiple Vitamin (MULTIVITAMINS PO) Take 1 tablet by mouth daily.    Historical Provider, MD  Multiple Vitamins-Minerals (ICAPS PO) Take 1 tablet by mouth 2 (two) times daily.    Historical Provider, MD  nebivolol (BYSTOLIC) 10 MG tablet Take 20 mg by mouth daily.    Historical Provider, MD  omeprazole (PRILOSEC) 20 MG capsule Take 20 mg by mouth daily.    Historical Provider, MD  rivaroxaban (XARELTO) 20 MG TABS tablet Take 1 tablet (20 mg total) by mouth daily. 02/23/14   Thompson Grayer, MD  Tiotropium Bromide Monohydrate (SPIRIVA RESPIMAT) 1.25 MCG/ACT  AERS Inhale 2 puffs into the lungs daily. 03/07/14   Deneise Lever, MD   There were no vitals taken for this visit. Physical Exam  Constitutional: She is oriented to person, place, and time. She appears well-developed and well-nourished.  HENT:  Head: Normocephalic and atraumatic.  Eyes: Conjunctivae and EOM are normal. Pupils are equal, round, and reactive to light.  Neck: Normal range of motion. Neck supple.  Cardiovascular: Normal rate and regular rhythm.  Exam reveals no gallop and no friction  rub.   No murmur heard. Pulmonary/Chest: Effort normal. No respiratory distress. She has wheezes. She has no rales. She exhibits no tenderness.  Bilateral wheezes  Abdominal: Soft. Bowel sounds are normal. She exhibits no distension and no mass. There is no tenderness. There is no rebound and no guarding.  Musculoskeletal: Normal range of motion. She exhibits no edema or tenderness.  Mild lower extremity edema  Neurological: She is alert and oriented to person, place, and time.  Skin: Skin is warm and dry.  Psychiatric: She has a normal mood and affect. Her behavior is normal. Judgment and thought content normal.  Nursing note and vitals reviewed.   ED Course  Procedures (including critical care time) Results for orders placed or performed during the hospital encounter of 03/11/14  CBC with Differential  Result Value Ref Range   WBC 7.7 4.0 - 10.5 K/uL   RBC 4.32 3.87 - 5.11 MIL/uL   Hemoglobin 13.0 12.0 - 15.0 g/dL   HCT 40.0 36.0 - 46.0 %   MCV 92.6 78.0 - 100.0 fL   MCH 30.1 26.0 - 34.0 pg   MCHC 32.5 30.0 - 36.0 g/dL   RDW 14.0 11.5 - 15.5 %   Platelets 253 150 - 400 K/uL   Neutrophils Relative % 68 43 - 77 %   Neutro Abs 5.2 1.7 - 7.7 K/uL   Lymphocytes Relative 21 12 - 46 %   Lymphs Abs 1.6 0.7 - 4.0 K/uL   Monocytes Relative 8 3 - 12 %   Monocytes Absolute 0.6 0.1 - 1.0 K/uL   Eosinophils Relative 3 0 - 5 %   Eosinophils Absolute 0.3 0.0 - 0.7 K/uL   Basophils Relative 0 0  - 1 %   Basophils Absolute 0.0 0.0 - 0.1 K/uL  Comprehensive metabolic panel  Result Value Ref Range   Sodium 141 135 - 145 mmol/L   Potassium 4.3 3.5 - 5.1 mmol/L   Chloride 102 96 - 112 mmol/L   CO2 29 19 - 32 mmol/L   Glucose, Bld 93 70 - 99 mg/dL   BUN 24 (H) 6 - 23 mg/dL   Creatinine, Ser 1.19 (H) 0.50 - 1.10 mg/dL   Calcium 9.0 8.4 - 10.5 mg/dL   Total Protein 6.6 6.0 - 8.3 g/dL   Albumin 3.8 3.5 - 5.2 g/dL   AST 18 0 - 37 U/L   ALT 18 0 - 35 U/L   Alkaline Phosphatase 84 39 - 117 U/L   Total Bilirubin 0.3 0.3 - 1.2 mg/dL   GFR calc non Af Amer 43 (L) >90 mL/min   GFR calc Af Amer 49 (L) >90 mL/min   Anion gap 10 5 - 15  Troponin I  Result Value Ref Range   Troponin I <0.03 <0.031 ng/mL  Brain natriuretic peptide  Result Value Ref Range   B Natriuretic Peptide 94.7 0.0 - 100.0 pg/mL   Dg Chest 2 View  03/11/2014   CLINICAL DATA:  One-day history of cough.  EXAM: CHEST  2 VIEW  COMPARISON:  08/15/2013  FINDINGS: The cardiac silhouette, mediastinal and hilar contours are within normal limits and stable. There is tortuosity and calcification of the thoracic aorta. There are chronic bronchitic type lung changes but no acute pulmonary findings. No pleural effusion. The bony thorax is intact. Stable surgical changes involving the left breast.  IMPRESSION: Chronic bronchitic type lung changes and basilar scarring but no infiltrates or effusions.   Electronically Signed   By: Kalman Jewels  M.D.   On: 03/11/2014 22:21     Imaging Review Dg Chest 2 View  03/11/2014   CLINICAL DATA:  One-day history of cough.  EXAM: CHEST  2 VIEW  COMPARISON:  08/15/2013  FINDINGS: The cardiac silhouette, mediastinal and hilar contours are within normal limits and stable. There is tortuosity and calcification of the thoracic aorta. There are chronic bronchitic type lung changes but no acute pulmonary findings. No pleural effusion. The bony thorax is intact. Stable surgical changes involving the left  breast.  IMPRESSION: Chronic bronchitic type lung changes and basilar scarring but no infiltrates or effusions.   Electronically Signed   By: Kalman Jewels M.D.   On: 03/11/2014 22:21     EKG Interpretation   Date/Time:  Saturday March 11 2014 22:49:54 EST Ventricular Rate:  54 PR Interval:  180 QRS Duration: 120 QT Interval:  452 QTC Calculation: 428 R Axis:   29 Text Interpretation:  Sinus rhythm Nonspecific intraventricular conduction  delay Baseline wander in lead(s) V2 Similar to prior Confirmed by Mingo Amber   MD, [place] ([id number]) on 03/11/2014 10:53:23 PM      MDM   Final diagnoses:  Cough  Wheezing    Patient with cough and wheezing.  Extensive pulmonary disease.  Advised to be seen in the ED because of worsening cough.  Followed by pulmonology.  Will check labs, CXR, and reassess.  CXR negative for infiltrate, no productive cough, no fevers.  Labs are reassuring.  Negative troponin, BNP not elevated.  No ischemic EKG changes.  Patient seen by and discussed with Dr. Mingo Amber.  Patient is feeling better and wants to go home.  She adamantly refuses to stay.  Discussed with Dr. Mingo Amber, will give return precautions.    Montine Circle, PA-C 03/12/14 0014  Evelina Bucy, MD 03/12/14 [id number]

## 2014-03-11 NOTE — ED Notes (Signed)
The pt has had a cough since this am.  She was seen by dr young and placed on spiriva Wednesday.  Since then she has beeh using the mew med.  She has a dry cough.  She thinks the spiruva is causing the cough. .  No real pain just  A heaviness in her chest.  She reports that she has had bronchitis  frequently

## 2014-03-11 NOTE — ED Notes (Signed)
MD at bedside. 

## 2014-03-11 NOTE — Telephone Encounter (Signed)
Called back patient's son who reports his mom is having chest heavyness and tightness with some increased cough.  Generally not feeling well.  Patient with severe obstructive lung disease.  I feel she should be evaluated tonight.  I recommended she report to the ED for evaluation.

## 2014-03-12 NOTE — Discharge Instructions (Signed)
Cough, Adult  A cough is a reflex that helps clear your throat and airways. It can help heal the body or may be a reaction to an irritated airway. A cough may only last 2 or 3 weeks (acute) or may last more than 8 weeks (chronic).  CAUSES Acute cough:  Viral or bacterial infections. Chronic cough:  Infections.  Allergies.  Asthma.  Post-nasal drip.  Smoking.  Heartburn or acid reflux.  Some medicines.  Chronic lung problems (COPD).  Cancer. SYMPTOMS   Cough.  Fever.  Chest pain.  Increased breathing rate.  High-pitched whistling sound when breathing (wheezing).  Colored mucus that you cough up (sputum). TREATMENT   A bacterial cough may be treated with antibiotic medicine.  A viral cough must run its course and will not respond to antibiotics.  Your caregiver may recommend other treatments if you have a chronic cough. HOME CARE INSTRUCTIONS   Only take over-the-counter or prescription medicines for pain, discomfort, or fever as directed by your caregiver. Use cough suppressants only as directed by your caregiver.  Use a cold steam vaporizer or humidifier in your bedroom or home to help loosen secretions.  Sleep in a semi-upright position if your cough is worse at night.  Rest as needed.  Stop smoking if you smoke. SEEK IMMEDIATE MEDICAL CARE IF:   You have pus in your sputum.  Your cough starts to worsen.  You cannot control your cough with suppressants and are losing sleep.  You begin coughing up blood.  You have difficulty breathing.  You develop pain which is getting worse or is uncontrolled with medicine.  You have a fever. MAKE SURE YOU:   Understand these instructions.  Will watch your condition.  Will get help right away if you are not doing well or get worse. Document Released: 07/19/2010 Document Revised: 04/14/2011 Document Reviewed: 07/19/2010 [employer] Patient Information 2015 [employer]. This information is not intended  to replace advice given to you by your health care provider. Make sure you discuss any questions you have with your health care provider.  

## 2014-03-21 ENCOUNTER — Ambulatory Visit
Admission: RE | Admit: 2014-03-21 | Discharge: 2014-03-21 | Disposition: A | Discharge status: Home / Self Care | Payer: PPO | Source: Ambulatory Visit | Attending: General Surgery | Admitting: General Surgery

## 2014-03-21 DIAGNOSIS — Z853 Personal history of malignant neoplasm of breast: Secondary | ICD-10-CM

## 2014-04-04 ENCOUNTER — Other Ambulatory Visit (HOSPITAL_BASED_OUTPATIENT_CLINIC_OR_DEPARTMENT_OTHER): Payer: PPO

## 2014-04-04 ENCOUNTER — Ambulatory Visit (HOSPITAL_BASED_OUTPATIENT_CLINIC_OR_DEPARTMENT_OTHER): Payer: PPO | Admitting: Oncology

## 2014-04-04 ENCOUNTER — Telehealth: Payer: Self-pay | Admitting: Oncology

## 2014-04-04 VITALS — BP 157/52 | HR 57 | Temp 97.8°F | Resp 18 | Ht 66.0 in | Wt 259.0 lb

## 2014-04-04 DIAGNOSIS — I5033 Acute on chronic diastolic (congestive) heart failure: Secondary | ICD-10-CM

## 2014-04-04 DIAGNOSIS — D0512 Intraductal carcinoma in situ of left breast: Secondary | ICD-10-CM

## 2014-04-04 DIAGNOSIS — J449 Chronic obstructive pulmonary disease, unspecified: Secondary | ICD-10-CM

## 2014-04-04 DIAGNOSIS — J189 Pneumonia, unspecified organism: Secondary | ICD-10-CM

## 2014-04-04 DIAGNOSIS — J4531 Mild persistent asthma with (acute) exacerbation: Secondary | ICD-10-CM

## 2014-04-04 DIAGNOSIS — C50412 Malignant neoplasm of upper-outer quadrant of left female breast: Secondary | ICD-10-CM

## 2014-04-04 DIAGNOSIS — N179 Acute kidney failure, unspecified: Secondary | ICD-10-CM

## 2014-04-04 LAB — CBC WITH DIFFERENTIAL/PLATELET
BASO%: 0.7 % (ref 0.0–2.0)
Basophils Absolute: 0.1 10*3/uL (ref 0.0–0.1)
EOS%: 2 % (ref 0.0–7.0)
Eosinophils Absolute: 0.2 10*3/uL (ref 0.0–0.5)
HCT: 38.3 % (ref 34.8–46.6)
HGB: 12.4 g/dL (ref 11.6–15.9)
LYMPH#: 1.4 10*3/uL (ref 0.9–3.3)
LYMPH%: 16.3 % (ref 14.0–49.7)
MCH: 30.3 pg (ref 25.1–34.0)
MCHC: 32.3 g/dL (ref 31.5–36.0)
MCV: 93.8 fL (ref 79.5–101.0)
MONO#: 0.7 10*3/uL (ref 0.1–0.9)
MONO%: 7.9 % (ref 0.0–14.0)
NEUT#: 6.1 10*3/uL (ref 1.5–6.5)
NEUT%: 73.1 % (ref 38.4–76.8)
Platelets: 284 10*3/uL (ref 145–400)
RBC: 4.09 10*6/uL (ref 3.70–5.45)
RDW: 14.2 % (ref 11.2–14.5)
WBC: 8.3 10*3/uL (ref 3.9–10.3)

## 2014-04-04 NOTE — Telephone Encounter (Signed)
appts made and avs printed for pt  Jordan Byrd

## 2014-04-04 NOTE — Progress Notes (Signed)
[place]  Telephone:([phone]) [phone] [phone]) [phone]     ID: [name] OB: [birth year]  MR#: [id number]  YTK#:[id number]  PCP: [name], MD GYN:   SU: Stark Klein OTHER MD: Thea Silversmith, Laurence Spates, Truitt Merle, Clint Young  CHIEF COMPLAINT: Estrogen receptor positive invasive breast cancer CURRENT TREATMENT observation   BREAST CANCER HISTORY: From the original intake note:   [name] underwent routine screening mammography at the breast Center 03/16/2013. The breast densities category C. In the left breast there were calcifications which call for further evaluation and on 04/13/2013 the patient underwent left unilateral mammography this confirmed a small group of pleomorphic calcifications not associated with architectural distortion. They had shown some change as compared to 2011.  Biopsy of the area in question 05/19/2013 showed ([id number]) and invasive ductal carcinoma, grade 1, estrogen receptor 100% positive, progesterone receptor 100% positive on both with strong staining intensity, with an MIB-1 of 6% and no HER-2 amplification, the signals ratio being 1.10 and a copy number per cell 2.30.  On 05/24/2013 the patient underwent bilateral breast MRI. There were only post biopsy changes noted in the left breast. There was no abnormality noted in the right breast and there are no abnormal appearing lymph nodes.  The patient's subsequent history is as detailed below   INTERVAL HISTORY: Takhia returns today for followup of her breast cancer. Her grandson drove her to the visit, but did not, into the room for the evaluation. Nialah says she is doing "wonderful". When asked what is her chief problems or what is bothering her the most she says "I have no problems". She does admit to not being able to walk because of significant shortness of breath and having had a "rhonchi to [place]" problem which took her to the hospital recently.  REVIEW OF  SYSTEMS: Mildreth denies unusual headaches, visual changes, nausea, vomiting, dizziness, or falls. She tells me she is seeing Dr. Annamaria Boots and he is taking care of her lung problems. She has "no heart problems". Bowel movements are normal. She denies pain, fever, rash, or bleeding. A detailed review of systems today was otherwise stable.  PAST MEDICAL HISTORY: Past Medical History  Diagnosis Date  . Asthma   . Arthritis   . Hypertension   . Atrial fibrillation     a. 04/04/11  . Osteoarthritis     a. 03/28/11 - R Total Hip Arthroplasty  . Cataracts, both eyes   . Pneumonia   . Bronchitis   . Pleurisy   . Ankle swelling   . Kidney infection   . Frequent urination   . Excessive urination at night   . Weight gain   . CHF (congestive heart failure)   . Shortness of breath     doe-  . Anemia   . Anxiety     pt denies this  . GERD (gastroesophageal reflux disease)     [name] Prilosec  . Chronic headaches     no longer having headaches  . Macular degeneration   . Breast cancer 05/19/13    left breast stage IIA     PAST SURGICAL HISTORY: Past Surgical History  Procedure Laterality Date  . Cardiac catheterization      1980's or 90's - reportedly normal  . Abdominal hysterectomy  1962  . Appendectomy  1954  . Cholecystectomy    . Back surgery  1990  . Cataract extraction bilateral w/ anterior vitrectomy  2007/2008  . Total hip arthroplasty  03/28/2011  Procedure: TOTAL HIP ARTHROPLASTY;  Surgeon: Yvette Rack., MD;  Location: [facility];  Service: Orthopedics;  Laterality: Right;  . Cardioversion  04/05/2011    Procedure: CARDIOVERSION;  Surgeon: Coralyn Mark, MD;  Location: [facility];  Service: Cardiovascular;  Laterality: N/A;  . Tonsillectomy  1943  . Adenoidectomy  1949  . Foot surgery  1947  . Thoracic disc surgery  2008  . Lumbar disc surgery  2010  . Knee surgery       both knees  . Shoulder surgery      left  . Total hip arthroplasty  03/28/2011    right  . Cardioversion  N/A 06/19/2012    Procedure: CARDIOVERSION-bedside(3W36);  Surgeon: Lelon Perla, MD;  Location: [facility];  Service: Cardiovascular;  Laterality: N/A;  . Colonoscopy    . Breast lumpectomy with needle localization Left 07/14/2013    Procedure: BREAST LUMPECTOMY WITH NEEDLE LOCALIZATION;  Surgeon: Stark Klein, MD;  Location: [facility];  Service: General;  Laterality: Left;    FAMILY HISTORY Family History  Problem Relation Age of Onset  . Diabetes Father     Father, Mother, 4 sisters (2 living)  . Heart attack Father     (Deceased)  . Heart failure Father     (deceased 65)  . Hypertension Father   . Stroke Mother     (deceased 12)  . Hypertension Mother   . Cancer Sister 21    breast cancer  . Heart attack Sister   . Diabetes Sister   . Cancer Other 55    niece with breast cancer  The patient's father died at the age of 11 with congestive heart failure. The patient's mother died at the age of 63 following a stroke. The patient had one brother or sisters. One of her sisters was diagnosed with breast cancer at the age of 2, and died 2 years later. A niece from a different sister was diagnosed with breast cancer in her early 12s. There is no history of ovarian cancer in the family.  GYNECOLOGIC HISTORY:  Menarche age 51. The patient underwent hysterectomy and bilateral salpingo-oophorectomy in her mid 5s. She took hormone replacement for more than 30 years, stopping around age 10. She is GX P0.   SOCIAL HISTORY:  Judia used to work in a [employer]. She is a widow, and lives alone, with no pets. She has 3 stepchildren, Cleo, who lives in [place] and is a retired Therapist, music, Esc[place]ia who lives in [place] and is retired from [employer] and Hassan Rowan lives in [place] and is a retired Engineer, structural. The patient's grandson [name] was present at the time of her intake visit. He is disabled secondary to spinal problems. He used to work in Architect    ADVANCED DIRECTIVES: The patient has  a living will in place but not healthcare power of attorney. She was given the appropriate documents to complete a healthcare power of attorney and she intends to name her grandson [name] as Special educational needs teacher of attorney. He can be reached at [place]: History  Substance Use Topics  . Smoking status: Former Smoker -- 21 years    Types: Cigarettes    Quit date: 03/17/1970  . Smokeless tobacco: Never Used  . Alcohol Use: No     Colonoscopy:2015   NAT:FTDDUK post hysterectomy  Bone density:2008, normal  Lipid panel:  Allergies  Allergen Reactions  . Methocarbamol Hives  . Vicodin [Hydrocodone-Acetaminophen] Other (See Comments)  Interacts with bp medication and drops blood pressure  . Aspirin Nausea And Vomiting  . Butazolidin [Phenylbutazone] Other (See Comments)    unknown  . Celebrex [Celecoxib] Nausea And Vomiting    Nausea and vomiting  . Codeine Nausea And Vomiting  . Doripenem Other (See Comments)    unknown  . Aspirin Buf(Alhyd-Mghyd-Cacar) Nausea And Vomiting  . Temazepam Other (See Comments)    unknown    Current Outpatient Prescriptions  Medication Sig Dispense Refill  . acetaminophen (TYLENOL) 500 MG tablet Take 1,000 mg by mouth 2 (two) times daily.    Marland Kitchen albuterol (PROVENTIL HFA;VENTOLIN HFA) 108 (90 BASE) MCG/ACT inhaler Inhale 1 puff into the lungs every 6 (six) hours as needed for wheezing.     Marland Kitchen amLODipine (NORVASC) 10 MG tablet Take 10 mg by mouth daily.    . budesonide-formoterol (SYMBICORT) 160-4.5 MCG/ACT inhaler Inhale 1 puff into the lungs 2 (two) times daily.     . chlorzoxazone (PARAFON) 500 MG tablet Take 250 mg by mouth 3 (three) times daily as needed for muscle spasms.    . flecainide (TAMBOCOR) 100 MG tablet Take 1 tablet (100 mg total) by mouth every 12 (twelve) hours. 180 tablet 3  . furosemide (LASIX) 20 MG tablet Take 20 mg by mouth daily.     . IRON PO Liquid Iron - Take 1 teaspoon a day in the mornings    . losartan  (COZAAR) 50 MG tablet Take 50 mg by mouth.     . Multiple Vitamin (MULTIVITAMINS PO) Take 1 tablet by mouth daily.    . Multiple Vitamins-Minerals (ICAPS PO) Take 1 tablet by mouth 2 (two) times daily.    . nebivolol (BYSTOLIC) 10 MG tablet Take 20 mg by mouth daily.    Marland Kitchen omeprazole (PRILOSEC) 20 MG capsule Take 20 mg by mouth daily.    . rivaroxaban (XARELTO) 20 MG TABS tablet Take 1 tablet (20 mg total) by mouth daily. 90 tablet 3  . Tiotropium Bromide Monohydrate (SPIRIVA RESPIMAT) 1.25 MCG/ACT AERS Inhale 2 puffs into the lungs daily. 1 Inhaler 0   No current facility-administered medications for this visit.    OBJECTIVE: Elderly white woman examined in the chair  Filed Vitals:   04/04/14 1320  BP: 157/52  Pulse: 57  Temp: 97.8 F (36.6 C)  Resp: 18     Body mass index is 41.82 kg/(m^2).    ECOG FS:2 - Symptomatic, <50% confined to bed  Sclerae unicteric, pupils round and equal Oropharynx clear and moist-- no thrush or other lesions No cervical or supraclavicular adenopathy Lungs no rales or rhonchi noted Heart regular rate and rhythm Abd soft, obese, nontender, positive bowel sounds MSK scoliosis but no focal spinal tenderness; patient uses a walker Neuro: nonfocal, well oriented, abrupt positive affect Breasts: The right breast is unremarkable. The left breast is status post lumpectomy. There is mild nipple retraction, which is not new. I do not palpate any well-defined mass and I do not see any skin change of concern. The left axilla is benign.    LAB RESULTS:  CMP     Component Value Date/Time   NA 141 03/11/2014 2233   NA 145 06/01/2013 0818   K 4.3 03/11/2014 2233   K 3.7 06/01/2013 0818   CL 102 03/11/2014 2233   CO2 29 03/11/2014 2233   CO2 27 06/01/2013 0818   GLUCOSE 93 03/11/2014 2233   GLUCOSE 94 06/01/2013 0818   BUN 24* 03/11/2014 2233   BUN 36.1*  06/01/2013 0818   CREATININE 1.19* 03/11/2014 2233   CREATININE 1.4* 06/01/2013 0818   CALCIUM 9.0  03/11/2014 2233   CALCIUM 10.1 06/01/2013 0818   PROT 6.6 03/11/2014 2233   PROT 6.9 06/01/2013 0818   ALBUMIN 3.8 03/11/2014 2233   ALBUMIN 4.0 06/01/2013 0818   AST 18 03/11/2014 2233   AST 15 06/01/2013 0818   ALT 18 03/11/2014 2233   ALT 12 06/01/2013 0818   ALKPHOS 84 03/11/2014 2233   ALKPHOS 87 06/01/2013 0818   BILITOT 0.3 03/11/2014 2233   BILITOT 0.30 06/01/2013 0818   GFRNONAA 43* 03/11/2014 2233   GFRAA 49* 03/11/2014 2233    I No results found for: SPEP  Lab Results  Component Value Date   WBC 8.3 04/04/2014   NEUTROABS 6.1 04/04/2014   HGB 12.4 04/04/2014   HCT 38.3 04/04/2014   MCV 93.8 04/04/2014   PLT 284 04/04/2014      Chemistry      Component Value Date/Time   NA 141 03/11/2014 2233   NA 145 06/01/2013 0818   K 4.3 03/11/2014 2233   K 3.7 06/01/2013 0818   CL 102 03/11/2014 2233   CO2 29 03/11/2014 2233   CO2 27 06/01/2013 0818   BUN 24* 03/11/2014 2233   BUN 36.1* 06/01/2013 0818   CREATININE 1.19* 03/11/2014 2233   CREATININE 1.4* 06/01/2013 0818      Component Value Date/Time   CALCIUM 9.0 03/11/2014 2233   CALCIUM 10.1 06/01/2013 0818   ALKPHOS 84 03/11/2014 2233   ALKPHOS 87 06/01/2013 0818   AST 18 03/11/2014 2233   AST 15 06/01/2013 0818   ALT 18 03/11/2014 2233   ALT 12 06/01/2013 0818   BILITOT 0.3 03/11/2014 2233   BILITOT 0.30 06/01/2013 0818       No results found for: LABCA2  No components found for: LABCA125  No results for input(s): INR in the last 168 hours.  Urinalysis    Component Value Date/Time   COLORURINE YELLOW 09/19/2012 2302   APPEARANCEUR CLEAR 09/19/2012 2302   LABSPEC 1.024 09/19/2012 2302   PHURINE 5.5 09/19/2012 2302   GLUCOSEU NEGATIVE 09/19/2012 2302   HGBUR NEGATIVE 09/19/2012 2302   BILIRUBINUR NEGATIVE 09/19/2012 2302   KETONESUR NEGATIVE 09/19/2012 2302   PROTEINUR NEGATIVE 09/19/2012 2302   UROBILINOGEN 0.2 09/19/2012 2302   NITRITE POSITIVE* 09/19/2012 2302   LEUKOCYTESUR SMALL*  09/19/2012 2302    STUDIES: Dg Chest 2 View  03/11/2014   CLINICAL DATA:  One-day history of cough.  EXAM: CHEST  2 VIEW  COMPARISON:  08/15/2013  FINDINGS: The cardiac silhouette, mediastinal and hilar contours are within normal limits and stable. There is tortuosity and calcification of the thoracic aorta. There are chronic bronchitic type lung changes but no acute pulmonary findings. No pleural effusion. The bony thorax is intact. Stable surgical changes involving the left breast.  IMPRESSION: Chronic bronchitic type lung changes and basilar scarring but no infiltrates or effusions.   Electronically Signed   By: Kalman Jewels M.D.   On: 03/11/2014 22:21   Mm Digital Diagnostic Bilat  03/21/2014   CLINICAL DATA:  81 year old female for annual bilateral mammograms. History of left breast cancer and left lumpectomy in 2015.  EXAM: DIGITAL DIAGNOSTIC BILATERAL MAMMOGRAM WITH CAD  COMPARISON:  07/14/2013  ACR Breast Density Category b: There are scattered areas of fibroglandular density.  FINDINGS: A magnification view of the lumpectomy site and routine views of both breasts demonstrate no suspicious mass, nonsurgical distortion  or worrisome calcifications.  Scarring in the upper outer left breast identified.  Mammographic images were processed with CAD.  IMPRESSION: No evidence of breast malignancy.  Left breast scarring.  RECOMMENDATION: Bilateral diagnostic mammograms in 1 year.  I have discussed the findings and recommendations with the patient. Results were also provided in writing at the conclusion of the visit. If applicable, a reminder letter will be sent to the patient regarding the next appointment.  BI-RADS CATEGORY  2: Benign Finding(s)   Electronically Signed   By: Margarette Canada M.D.   On: 03/21/2014 10:37     ASSESSMENT: 81 y.o. BRCA negative [place] woman status post left breast biopsy 05/19/2013 for a clinical T2 N0, stage IIA invasive ductal carcinoma, grade 1, estrogen and progesterone  receptor both strongly positive, with an MIB-1 of 6%, and no HER-2 amplification  (1) status post left lumpectomy 07/14/2013 for ductal carcinoma in situ, grade 1, with negative margins  (2) the patient was offered adjuvant radiation June 2015, declined.  (3) aromatase inhibitors discussed with the patient September 2015; declined  PLAN: Lakita was relentlessly positive today, denying all symptoms, or problems. I think what she really is telling me is that she is seeing too many doctors and doesn't know exactly why she needs to see [place]. We discussed the fact that she has had no adjuvant treatment for her breast cancer and therefore her risk of both local and systemic recurrence is moderate to high. The good news is that her physical exam and lab work today so far and her mammography a couple of weeks ago all showed no evidence of disease recurrence.  We're going to start seeing her on a once a year basis. I am not going to draw labs since she has frequent lab work from her other physicians. I have urged her however to let us know if she develops any change in the left breast are really in either breast or any symptom that is new and that she cannot explain that seems to her to be getting worse after a few days instead of getting better. In that case of course we would see her sooner than a year from now.  Loran has a good understanding of this plan. She agrees with it.Burnis Medin call with any problems that may develop before the next visit here.  Chauncey Cruel, MD   04/04/2014 1:30 PM

## 2014-04-24 ENCOUNTER — Ambulatory Visit: Payer: Medicare Other | Admitting: Nurse Practitioner

## 2014-05-10 ENCOUNTER — Telehealth: Payer: Self-pay | Admitting: Internal Medicine

## 2014-05-10 ENCOUNTER — Encounter: Payer: Self-pay | Admitting: Internal Medicine

## 2014-05-10 ENCOUNTER — Ambulatory Visit (INDEPENDENT_AMBULATORY_CARE_PROVIDER_SITE_OTHER): Payer: PPO | Admitting: Internal Medicine

## 2014-05-10 VITALS — BP 128/60 | HR 59 | Ht 66.0 in | Wt 258.0 lb

## 2014-05-10 DIAGNOSIS — R197 Diarrhea, unspecified: Secondary | ICD-10-CM | POA: Diagnosis not present

## 2014-05-10 DIAGNOSIS — J101 Influenza due to other identified influenza virus with other respiratory manifestations: Secondary | ICD-10-CM | POA: Diagnosis not present

## 2014-05-10 DIAGNOSIS — J432 Centrilobular emphysema: Secondary | ICD-10-CM

## 2014-05-10 DIAGNOSIS — J449 Chronic obstructive pulmonary disease, unspecified: Secondary | ICD-10-CM | POA: Diagnosis not present

## 2014-05-10 DIAGNOSIS — G4730 Sleep apnea, unspecified: Secondary | ICD-10-CM

## 2014-05-10 NOTE — Progress Notes (Signed)
03/07/14- 45 yoF former smoker referred courtesy of Dr Rayann Heman for sleep medicine evaluation. Son here Complicated by hx breast CaL She says she sleeps "fine" except for nocturia every 2 hours. Widowed, not aware of snoring. Admits some daytime sleepiness. Smoked for 21 years, quitting at age 81. History of intermittent asthma without rhinitis. Had pneumonia last year. Sometimes wheezes but denies coughing. Using Symbicort 2 puffs, once daily and rare need for rescue inhaler.  Dyspnea on exertion has increased in the past year-walking one or 2 rooms, making bed. Weight increased.. Feet swell if she sits a lot. Cardiology follows for atrial fibrillation/Xarelto She had worked in a [employer] with incidental dust. PFT 10/26/13- Moderately severe obstructive disease, no response, possible restriction, mild Diffusion reduction.FVC 2.18/ 75%, FEV1 1.30/ 59%, FEV1/FVC 0.60,  DLCO 71%, TLC 99% CXR 08/15/13- IMPRESSION: No acute cardiopulmonary disease. No explanation for left-sided chest pain. Electronically Signed  By: Abigail Miyamoto M.D.  On: 08/15/2013  05/10/14- 80 yoF former smoker followed for COPD evaluation. Son here SOB w/activity;chest tightness; wheezing; Complicated by hx L breast Ca, dCHF, PAF Overnight oximetry on 03/10/2014 qualified for oxygen at 2 L during sleep but she is reluctant. Says she used oxygen in the past and "couldn't keep it on". Continues Symbicort. Did not like Spiriva. Unwilling to consider a sleep study.  ROS-see HPI Constitutional:   No-   weight loss, night sweats, fevers, chills, fatigue, lassitude. HEENT:   No-  headaches, difficulty swallowing, tooth/dental problems, sore throat,       No-  sneezing, itching, ear ache, nasal congestion, post nasal drip,  CV:  No-   chest pain, orthopnea, PND, swelling in lower extremities, anasarca,                                  dizziness, +palpitations Resp: + shortness of breath with exertion or at rest.              No-    productive cough,  No non-productive cough,  No- coughing up of blood.             No-   change in color of mucus.  + wheezing.   Skin: No-   rash or lesions. GI:  No-   heartburn, indigestion, abdominal pain, nausea, vomiting, GU:  MS:  + joint pain or swelling.  No- decreased range of motion.  No- back pain. Neuro-     nothing unusual Psych:  No- change in mood or affect. No depression or anxiety.  No memory loss.  OBJ- Physical Exam + obese General- Alert, Oriented, Affect-appropriate, Distress- none acute Skin- rash-none, lesions- none, excoriation- none Lymphadenopathy- none Head- atraumatic            Eyes- Gross vision intact, PERRLA, conjunctivae and secretions clear            Ears- Hearing, canals-normal            Nose- Clear, no-Septal dev, mucus, polyps, erosion, perforation             Throat- Mallampati II , mucosa clear , drainage- none, tonsils- atrophic, + dentures Neck- flexible , trachea midline, no stridor , thyroid nl, carotid no bruit Chest - symmetrical excursion , unlabored           Heart/CV- RRR , no murmur , no gallop  , no rub, nl s1 s2                           -  JVD- none , edema+1, stasis changes- none, varices- none           Lung- clear to P&A, wheeze- none, cough- none , dullness-none, rub- none           Chest wall-  Abd-  Br/ Gen/ Rectal- Not done, not indicated Extrem- cyanosis- none, clubbing, none, atrophy- none, strength- nl, + walker Neuro- grossly intact to observation

## 2014-05-10 NOTE — Telephone Encounter (Signed)
lmtcb X1 for Melissa.  

## 2014-05-10 NOTE — Patient Instructions (Signed)
Order- DME home O2 2L for sleep      Dx centrilobular emphysema, COPD  Please call as needed

## 2014-05-10 NOTE — Telephone Encounter (Signed)
Order was placed for nocturnal O2, but was not placed using the O2 template. Order has been corrected. Will send to [facility] as FYI.

## 2014-05-11 ENCOUNTER — Telehealth: Payer: Self-pay | Admitting: Internal Medicine

## 2014-05-11 DIAGNOSIS — J449 Chronic obstructive pulmonary disease, unspecified: Secondary | ICD-10-CM

## 2014-05-11 NOTE — Telephone Encounter (Signed)
Jordan Byrd is aware that order has been placed. Nothing further was needed.

## 2014-05-11 NOTE — Telephone Encounter (Signed)
Ok order [employer] air for recheck   Dx COPD

## 2014-05-11 NOTE — Telephone Encounter (Signed)
Spoke with [facility] states that ONO was completed back in 03/2014 Nocturnal O2 order placed 05/10/14 cannot be processed until they have an ONO within the 30-day period of order being placed. ONO order was placed on 03/07/2014  Need another ONO order placed to be repeated.  Please advise Dr Annamaria Boots. Thanks.

## 2014-05-12 ENCOUNTER — Telehealth: Payer: Self-pay | Admitting: Internal Medicine

## 2014-05-12 NOTE — Telephone Encounter (Signed)
Will route to CY to let him know.

## 2014-05-12 NOTE — Telephone Encounter (Signed)
Noted  

## 2014-05-13 ENCOUNTER — Inpatient Hospital Stay (HOSPITAL_COMMUNITY)
Admission: EM | Admit: 2014-05-13 | Discharge: 2014-05-15 | DRG: 190 | Disposition: A | Discharge status: Home / Self Care | Payer: PPO | Attending: Internal Medicine | Admitting: Internal Medicine

## 2014-05-13 ENCOUNTER — Emergency Department (HOSPITAL_COMMUNITY): Payer: PPO

## 2014-05-13 ENCOUNTER — Encounter (HOSPITAL_COMMUNITY): Payer: Self-pay | Admitting: Emergency Medicine

## 2014-05-13 DIAGNOSIS — K219 Gastro-esophageal reflux disease without esophagitis: Secondary | ICD-10-CM | POA: Diagnosis present

## 2014-05-13 DIAGNOSIS — N183 Chronic kidney disease, stage 3 unspecified: Secondary | ICD-10-CM | POA: Diagnosis present

## 2014-05-13 DIAGNOSIS — I5032 Chronic diastolic (congestive) heart failure: Secondary | ICD-10-CM | POA: Diagnosis present

## 2014-05-13 DIAGNOSIS — J441 Chronic obstructive pulmonary disease with (acute) exacerbation: Principal | ICD-10-CM | POA: Diagnosis present

## 2014-05-13 DIAGNOSIS — J101 Influenza due to other identified influenza virus with other respiratory manifestations: Secondary | ICD-10-CM | POA: Diagnosis present

## 2014-05-13 DIAGNOSIS — J09X2 Influenza due to identified novel influenza A virus with other respiratory manifestations: Secondary | ICD-10-CM | POA: Diagnosis present

## 2014-05-13 DIAGNOSIS — Z853 Personal history of malignant neoplasm of breast: Secondary | ICD-10-CM | POA: Diagnosis not present

## 2014-05-13 DIAGNOSIS — Z96641 Presence of right artificial hip joint: Secondary | ICD-10-CM | POA: Diagnosis present

## 2014-05-13 DIAGNOSIS — J9601 Acute respiratory failure with hypoxia: Secondary | ICD-10-CM | POA: Diagnosis present

## 2014-05-13 DIAGNOSIS — J45909 Unspecified asthma, uncomplicated: Secondary | ICD-10-CM | POA: Diagnosis present

## 2014-05-13 DIAGNOSIS — I4891 Unspecified atrial fibrillation: Secondary | ICD-10-CM | POA: Diagnosis not present

## 2014-05-13 DIAGNOSIS — Z66 Do not resuscitate: Secondary | ICD-10-CM | POA: Diagnosis present

## 2014-05-13 DIAGNOSIS — Z7901 Long term (current) use of anticoagulants: Secondary | ICD-10-CM

## 2014-05-13 DIAGNOSIS — I129 Hypertensive chronic kidney disease with stage 1 through stage 4 chronic kidney disease, or unspecified chronic kidney disease: Secondary | ICD-10-CM | POA: Diagnosis present

## 2014-05-13 DIAGNOSIS — M1990 Unspecified osteoarthritis, unspecified site: Secondary | ICD-10-CM | POA: Diagnosis present

## 2014-05-13 DIAGNOSIS — I480 Paroxysmal atrial fibrillation: Secondary | ICD-10-CM | POA: Diagnosis present

## 2014-05-13 DIAGNOSIS — Z803 Family history of malignant neoplasm of breast: Secondary | ICD-10-CM

## 2014-05-13 DIAGNOSIS — Z87891 Personal history of nicotine dependence: Secondary | ICD-10-CM | POA: Diagnosis not present

## 2014-05-13 DIAGNOSIS — I10 Essential (primary) hypertension: Secondary | ICD-10-CM | POA: Diagnosis present

## 2014-05-13 DIAGNOSIS — R0602 Shortness of breath: Secondary | ICD-10-CM | POA: Diagnosis present

## 2014-05-13 LAB — BASIC METABOLIC PANEL
Anion gap: 12 (ref 5–15)
BUN: 27 mg/dL — AB (ref 6–23)
CO2: 25 mmol/L (ref 19–32)
Calcium: 9.3 mg/dL (ref 8.4–10.5)
Chloride: 101 mmol/L (ref 96–112)
Creatinine, Ser: 1.32 mg/dL — ABNORMAL HIGH (ref 0.50–1.10)
GFR calc Af Amer: 44 mL/min — ABNORMAL LOW (ref >90)
GFR calc non Af Amer: 38 mL/min — ABNORMAL LOW (ref >90)
GLUCOSE: 113 mg/dL — AB (ref 70–99)
Potassium: 4.2 mmol/L (ref 3.5–5.1)
SODIUM: 138 mmol/L (ref 135–145)

## 2014-05-13 LAB — CBC
HCT: 38.9 % (ref 36.0–46.0)
Hemoglobin: 12.5 g/dL (ref 12.0–15.0)
MCH: 30.3 pg (ref 26.0–34.0)
MCHC: 32.1 g/dL (ref 30.0–36.0)
MCV: 94.4 fL (ref 78.0–100.0)
PLATELETS: 223 10*3/uL (ref 150–400)
RBC: 4.12 MIL/uL (ref 3.87–5.11)
RDW: 14.2 % (ref 11.5–15.5)
WBC: 6.2 10*3/uL (ref 4.0–10.5)

## 2014-05-13 LAB — INFLUENZA PANEL BY PCR (TYPE A & B)
H1N1 flu by pcr: DETECTED — AB
INFLBPCR: NEGATIVE
Influenza A By PCR: POSITIVE — AB

## 2014-05-13 LAB — I-STAT TROPONIN, ED: Troponin i, poc: 0 ng/mL (ref 0.00–0.08)

## 2014-05-13 LAB — BRAIN NATRIURETIC PEPTIDE: B NATRIURETIC PEPTIDE 5: 168.6 pg/mL — AB (ref 0.0–100.0)

## 2014-05-13 MED ORDER — PANTOPRAZOLE SODIUM 40 MG PO TBEC
40.0000 mg | DELAYED_RELEASE_TABLET | Freq: Every day | ORAL | Status: DC
Start: 2014-05-14 — End: 2014-05-15
  Administered 2014-05-14 – 2014-05-15 (×2): 40 mg via ORAL
  Filled 2014-05-13 (×2): qty 1

## 2014-05-13 MED ORDER — ACETAMINOPHEN 325 MG PO TABS
650.0000 mg | ORAL_TABLET | Freq: Once | ORAL | Status: AC
  Administered 2014-05-13: 650 mg via ORAL
  Filled 2014-05-13: qty 2

## 2014-05-13 MED ORDER — IPRATROPIUM-ALBUTEROL 0.5-2.5 (3) MG/3ML IN SOLN
3.0000 mL | Freq: Once | RESPIRATORY_TRACT | Status: AC
  Administered 2014-05-13: 3 mL via RESPIRATORY_TRACT
  Filled 2014-05-13: qty 3

## 2014-05-13 MED ORDER — LEVALBUTEROL HCL 0.63 MG/3ML IN NEBU
0.6300 mg | INHALATION_SOLUTION | RESPIRATORY_TRACT | Status: DC
  Administered 2014-05-13 – 2014-05-14 (×4): 0.63 mg via RESPIRATORY_TRACT
  Filled 2014-05-13 (×10): qty 3

## 2014-05-13 MED ORDER — AMLODIPINE BESYLATE 10 MG PO TABS
10.0000 mg | ORAL_TABLET | Freq: Every day | ORAL | Status: DC
Start: 2014-05-14 — End: 2014-05-15
  Administered 2014-05-14 – 2014-05-15 (×2): 10 mg via ORAL
  Filled 2014-05-13 (×2): qty 1

## 2014-05-13 MED ORDER — IPRATROPIUM-ALBUTEROL 0.5-2.5 (3) MG/3ML IN SOLN: 3.0000 mL | Freq: Once | RESPIRATORY_TRACT | Status: DC

## 2014-05-13 MED ORDER — METHYLPREDNISOLONE SODIUM SUCC 125 MG IJ SOLR
60.0000 mg | Freq: Three times a day (TID) | INTRAMUSCULAR | Status: DC
  Administered 2014-05-13 – 2014-05-15 (×5): 60 mg via INTRAVENOUS
  Filled 2014-05-13 (×2): qty 0.96
  Filled 2014-05-13: qty 2
  Filled 2014-05-13 (×3): qty 0.96
  Filled 2014-05-13: qty 2
  Filled 2014-05-13 (×2): qty 0.96

## 2014-05-13 MED ORDER — CETYLPYRIDINIUM CHLORIDE 0.05 % MT LIQD
7.0000 mL | Freq: Two times a day (BID) | OROMUCOSAL | Status: DC
  Administered 2014-05-13 – 2014-05-15 (×4): 7 mL via OROMUCOSAL

## 2014-05-13 MED ORDER — LOSARTAN POTASSIUM 50 MG PO TABS
50.0000 mg | ORAL_TABLET | Freq: Every day | ORAL | Status: DC
  Administered 2014-05-14 – 2014-05-15 (×2): 50 mg via ORAL
  Filled 2014-05-13 (×2): qty 1

## 2014-05-13 MED ORDER — ALBUTEROL SULFATE (2.5 MG/3ML) 0.083% IN NEBU
5.0000 mg | INHALATION_SOLUTION | Freq: Once | RESPIRATORY_TRACT | Status: AC
  Administered 2014-05-13: 5 mg via RESPIRATORY_TRACT
  Filled 2014-05-13: qty 6

## 2014-05-13 MED ORDER — LEVOFLOXACIN 250 MG PO TABS
250.0000 mg | ORAL_TABLET | Freq: Every day | ORAL | Status: DC
  Administered 2014-05-14 – 2014-05-15 (×2): 250 mg via ORAL
  Filled 2014-05-13 (×2): qty 1

## 2014-05-13 MED ORDER — FLECAINIDE ACETATE 100 MG PO TABS
100.0000 mg | ORAL_TABLET | Freq: Two times a day (BID) | ORAL | Status: DC
  Administered 2014-05-13 – 2014-05-15 (×4): 100 mg via ORAL
  Filled 2014-05-13 (×5): qty 1

## 2014-05-13 MED ORDER — IPRATROPIUM BROMIDE 0.02 % IN SOLN
0.5000 mg | RESPIRATORY_TRACT | Status: DC
  Administered 2014-05-13 – 2014-05-14 (×4): 0.5 mg via RESPIRATORY_TRACT
  Filled 2014-05-13 (×4): qty 2.5

## 2014-05-13 MED ORDER — NEBIVOLOL HCL 10 MG PO TABS
20.0000 mg | ORAL_TABLET | Freq: Every day | ORAL | Status: DC
Start: 2014-05-14 — End: 2014-05-15
  Administered 2014-05-14 – 2014-05-15 (×2): 20 mg via ORAL
  Filled 2014-05-13 (×3): qty 2

## 2014-05-13 MED ORDER — METHYLPREDNISOLONE SODIUM SUCC 125 MG IJ SOLR
125.0000 mg | Freq: Once | INTRAMUSCULAR | Status: AC
  Administered 2014-05-13: 125 mg via INTRAVENOUS
  Filled 2014-05-13: qty 2

## 2014-05-13 MED ORDER — SODIUM CHLORIDE 0.9 % IJ SOLN
3.0000 mL | Freq: Two times a day (BID) | INTRAMUSCULAR | Status: DC
  Administered 2014-05-13 – 2014-05-15 (×5): 3 mL via INTRAVENOUS

## 2014-05-13 MED ORDER — RIVAROXABAN 20 MG PO TABS
20.0000 mg | ORAL_TABLET | Freq: Every day | ORAL | Status: DC
  Administered 2014-05-13 – 2014-05-14 (×2): 20 mg via ORAL
  Filled 2014-05-13 (×3): qty 1

## 2014-05-13 MED ORDER — FUROSEMIDE 20 MG PO TABS
20.0000 mg | ORAL_TABLET | Freq: Every day | ORAL | Status: DC
  Administered 2014-05-14 – 2014-05-15 (×2): 20 mg via ORAL
  Filled 2014-05-13 (×2): qty 1

## 2014-05-13 MED ORDER — BUDESONIDE 0.25 MG/2ML IN SUSP
0.2500 mg | Freq: Two times a day (BID) | RESPIRATORY_TRACT | Status: DC
  Filled 2014-05-13: qty 2

## 2014-05-13 MED ORDER — LEVOFLOXACIN 500 MG PO TABS
500.0000 mg | ORAL_TABLET | Freq: Once | ORAL | Status: AC
  Administered 2014-05-13: 500 mg via ORAL
  Filled 2014-05-13: qty 1

## 2014-05-13 MED ORDER — BUDESONIDE 0.25 MG/2ML IN SUSP
0.2500 mg | Freq: Two times a day (BID) | RESPIRATORY_TRACT | Status: DC
  Administered 2014-05-13 – 2014-05-15 (×4): 0.25 mg via RESPIRATORY_TRACT
  Filled 2014-05-13 (×7): qty 2

## 2014-05-13 NOTE — ED Notes (Signed)
Patient transported to X-ray 

## 2014-05-13 NOTE — Progress Notes (Addendum)
ANTIBIOTIC CONSULT NOTE - INITIAL  Pharmacy Consult for levaquin Indication: COPD exacerbation, bronchitis, CAP  Allergies  Allergen Reactions  . Methocarbamol Hives  . Vicodin [Hydrocodone-Acetaminophen] Other (See Comments)    Interacts with bp medication and drops blood pressure  . Aspirin Nausea And Vomiting  . Butazolidin [Phenylbutazone] Other (See Comments)    unknown  . Celebrex [Celecoxib] Nausea And Vomiting    Nausea and vomiting  . Codeine Nausea And Vomiting  . Doripenem Other (See Comments)    unknown  . Aspirin Buf(Alhyd-Mghyd-Cacar) Nausea And Vomiting  . Temazepam Other (See Comments)    unknown    Patient Measurements: Height: 5\' 6"  (167.6 cm) Weight: 253 lb 9.6 oz (115.032 kg) IBW/kg (Calculated) : 59.3   Vital Signs: Temp: 99.1 F (37.3 C) (04/09 1000) Temp Source: Oral (04/09 1000) BP: 148/62 mmHg (04/09 1230) Pulse Rate: 73 (04/09 1244) Intake/Output from previous day:   Intake/Output from this shift:    Labs:  Recent Labs  05/13/14 0955  WBC 6.2  HGB 12.5  PLT 223  CREATININE 1.32*   Estimated Creatinine Clearance: 45.2 mL/min (by C-G formula based on Cr of 1.32). No results for input(s): VANCOTROUGH, VANCOPEAK, VANCORANDOM, GENTTROUGH, GENTPEAK, GENTRANDOM, TOBRATROUGH, TOBRAPEAK, TOBRARND, AMIKACINPEAK, AMIKACINTROU, AMIKACIN in the last 72 hours.   Microbiology: No results found for this or any previous visit (from the past 720 hour(s)).  Medical History: Past Medical History  Diagnosis Date  . Asthma   . Arthritis   . Hypertension   . Atrial fibrillation     a. 04/04/11  . Osteoarthritis     a. 03/28/11 - R Total Hip Arthroplasty  . Cataracts, both eyes   . Pneumonia   . Bronchitis   . Pleurisy   . Ankle swelling   . Kidney infection   . Frequent urination   . Excessive urination at night   . Weight gain   . CHF (congestive heart failure)   . Shortness of breath     doe-  . Anemia   . Anxiety     pt denies this   . GERD (gastroesophageal reflux disease)     [name] Prilosec  . Chronic headaches     no longer having headaches  . Macular degeneration   . Breast cancer 05/19/13    left breast stage IIA     Assessment: 81 yo F in ED with SOB and cough.  Pharmacy consulted to dose levaquin for COPD exacerbation, bronchitis, CAP.   Wt 115 kg, WBC 6.3, creat 1.32, Temp 99.1, CXR negative.  Creat cl ~ 45 ml/min.    Plan:  -levaquin 500 mg po x 1 dose followed by levaquin 250 mg po qday x 7 - 14 days -pharmacy to sign off   Thanks Eudelia Bunch, Pharm.D. [phone] 05/13/2014 1:48 PM   Addendum: Pharmacy consulted to dose xarelto for afib.  Pt on xarelto PTA for afib.  Hom dose is 20 mg daily. Her creatinine is mildly elevated today at 1.32.  Per MD note she has CKD stage 2 with baseline creat 1.2- 1.4.   Her creatinine clearance calculated by C&C with her TBW is 64 ml/min.  Plan: continue home dose of xarelto 20 mg with supper  Thanks  Eudelia Bunch, Pharm.D. [phone] 05/13/2014 2:44 PM

## 2014-05-13 NOTE — ED Notes (Signed)
Pt c/o non productive cough, wheeze and shortness of breath onset last night. Pt with shortness of breath at rest while talking and with movement.

## 2014-05-13 NOTE — H&P (Signed)
History and Physical    SHIREEN RAYBURN XTG:[id number] DOB: [birth year] DOA: 05/13/2014  Referring physician: Dr. Wyvonnia Dusky PCP: [name], MD  Specialists: none  Chief Complaint: shortness of breath   HPI: Jordan Byrd is a 81 y.o. female has a past medical history significant for COPD, prior tobacco abuse, hypertension, atrial fibrillation on chronic anticoagulation, diastolic heart failure, history of breast cancer status post lumpectomy 2015, presents to the emergency room with a chief complaint of shortness of breath. Patient tells me that last night she started feeling congested, she started coughing without any sputum production, noticed more wheezing and shortness of breath. She denies any fever, however has noticed a low-grade temperature last night of 99.1. She endorses chills. She endorses chest tightness every time she coughs, however denies any chest pain. She denies any abdominal pain, nausea, vomiting or diarrhea. She endorses sick contacts, her son who is in the room has been having similar symptoms that started last night as well. She denies any lightheadedness or dizziness. She endorses numbness in her feet, which is chronic. She was told she was supposed to wear oxygen at home and one in the past. In the emergency room, patient's oxygen saturation is stable on 2 L nasal cannula, Blood work reveals mildly elevated creatinine, chest x-ray with chronic changes without any acute abnormalities. [facility] asked for admission for COPD exacerbation.  Review of Systems: As per history of present illness, otherwise 10 point review of system negative  Past Medical History  Diagnosis Date  . Asthma   . Arthritis   . Hypertension   . Atrial fibrillation     a. 04/04/11  . Osteoarthritis     a. 03/28/11 - R Total Hip Arthroplasty  . Cataracts, both eyes   . Pneumonia   . Bronchitis   . Pleurisy   . Ankle swelling   . Kidney infection   . Frequent urination   . Excessive urination at  night   . Weight gain   . CHF (congestive heart failure)   . Shortness of breath     doe-  . Anemia   . Anxiety     pt denies this  . GERD (gastroesophageal reflux disease)     [name] Prilosec  . Chronic headaches     no longer having headaches  . Macular degeneration   . Breast cancer 05/19/13    left breast stage IIA    Past Surgical History  Procedure Laterality Date  . Cardiac catheterization      1980's or 90's - reportedly normal  . Abdominal hysterectomy  1962  . Appendectomy  1954  . Cholecystectomy    . Back surgery  1990  . Cataract extraction bilateral w/ anterior vitrectomy  2007/2008  . Total hip arthroplasty  03/28/2011    Procedure: TOTAL HIP ARTHROPLASTY;  Surgeon: Yvette Rack., MD;  Location: [facility];  Service: Orthopedics;  Laterality: Right;  . Cardioversion  04/05/2011    Procedure: CARDIOVERSION;  Surgeon: Coralyn Mark, MD;  Location: [facility];  Service: Cardiovascular;  Laterality: N/A;  . Tonsillectomy  1943  . Adenoidectomy  1949  . Foot surgery  1947  . Thoracic disc surgery  2008  . Lumbar disc surgery  2010  . Knee surgery       both knees  . Shoulder surgery      left  . Total hip arthroplasty  03/28/2011    right  . Cardioversion N/A 06/19/2012  Procedure: CARDIOVERSION-bedside(3W36);  Surgeon: Lelon Perla, MD;  Location: [place];  Service: Cardiovascular;  Laterality: N/A;  . Colonoscopy    . Breast lumpectomy with needle localization Left 07/14/2013    Procedure: BREAST LUMPECTOMY WITH NEEDLE LOCALIZATION;  Surgeon: Stark Klein, MD;  Location: [facility];  Service: General;  Laterality: Left;   Social History:  reports that she quit smoking about 44 years ago. Her smoking use included Cigarettes. She quit after 21 years of use. She has never used smokeless tobacco. She reports that she does not drink alcohol or use illicit drugs.  Allergies  Allergen Reactions  . Methocarbamol Hives  . Vicodin [Hydrocodone-Acetaminophen] Other (See Comments)     Interacts with bp medication and drops blood pressure  . Aspirin Nausea And Vomiting  . Butazolidin [Phenylbutazone] Other (See Comments)    unknown  . Celebrex [Celecoxib] Nausea And Vomiting    Nausea and vomiting  . Codeine Nausea And Vomiting  . Doripenem Other (See Comments)    unknown  . Aspirin Buf(Alhyd-Mghyd-Cacar) Nausea And Vomiting  . Temazepam Other (See Comments)    unknown    Family History  Problem Relation Age of Onset  . Diabetes Father     Father, Mother, 4 sisters (2 living)  . Heart attack Father     (Deceased)  . Heart failure Father     (deceased 33)  . Hypertension Father   . Stroke Mother     (deceased 74)  . Hypertension Mother   . Cancer Sister 15    breast cancer  . Heart attack Sister   . Diabetes Sister   . Cancer Other 19    niece with breast cancer    Prior to Admission medications   Medication Sig Start Date End Date Taking? Authorizing Provider  acetaminophen (TYLENOL) 500 MG tablet Take 1,000 mg by mouth 2 (two) times daily.    Historical Provider, MD  albuterol (PROVENTIL HFA;VENTOLIN HFA) 108 (90 BASE) MCG/ACT inhaler Inhale 1 puff into the lungs every 6 (six) hours as needed for wheezing.     Historical Provider, MD  amLODipine (NORVASC) 10 MG tablet Take 10 mg by mouth daily.    Historical Provider, MD  budesonide-formoterol (SYMBICORT) 160-4.5 MCG/ACT inhaler Inhale 1 puff into the lungs 2 (two) times daily.     Historical Provider, MD  chlorzoxazone (PARAFON) 500 MG tablet Take 250 mg by mouth 3 (three) times daily as needed for muscle spasms.    Historical Provider, MD  flecainide (TAMBOCOR) 100 MG tablet Take 1 tablet (100 mg total) by mouth every 12 (twelve) hours. 11/11/12   Thompson Grayer, MD  furosemide (LASIX) 20 MG tablet Take 20 mg by mouth daily.  09/22/12   Thurnell Lose, MD  IRON PO Liquid Iron - Take 1 teaspoon a day in the mornings    Historical Provider, MD  losartan (COZAAR) 50 MG tablet Take 50 mg by mouth.   10/04/13   Historical Provider, MD  Multiple Vitamin (MULTIVITAMINS PO) Take 1 tablet by mouth daily.    Historical Provider, MD  Multiple Vitamins-Minerals (ICAPS PO) Take 1 tablet by mouth 2 (two) times daily.    Historical Provider, MD  nebivolol (BYSTOLIC) 10 MG tablet Take 20 mg by mouth daily.    Historical Provider, MD  omeprazole (PRILOSEC) 20 MG capsule Take 20 mg by mouth daily.    Historical Provider, MD  rivaroxaban (XARELTO) 20 MG TABS tablet Take 1 tablet (20 mg total) by mouth daily. 02/23/14  Thompson Grayer, MD  Tiotropium Bromide Monohydrate (SPIRIVA RESPIMAT) 1.25 MCG/ACT AERS Inhale 2 puffs into the lungs daily. Patient not taking: Reported on 05/10/2014 03/07/14   Deneise Lever, MD   Physical Exam: Filed Vitals:   05/13/14 1200 05/13/14 1230 05/13/14 1244 05/13/14 1356  BP: 146/56 148/62    Pulse: 71 75 73 77  Temp:      TempSrc:      Resp: 23 22 21 16   Height:      Weight:      SpO2: 92% 93% 97% 98%     General:  In mild distress, getting a breathing treatment right now  Eyes: no scleral icterus  ENT: moist oropharynx, no erythema/exudates  Neck: supple, no lymphadenopathy  Cardiovascular: regular rate without MRG; 2+ peripheral pulses, no JVD, trace peripheral edema  Respiratory: Moves air well bilaterally, diffuse wheezing throughout bilateral lung fields  Abdomen: soft, non tender to palpation, positive bowel sounds, no guarding, no rebound  Skin: no rashes  Musculoskeletal: normal bulk and tone, no joint swelling  Psychiatric: normal mood and affect  Neurologic: CN 2-12 grossly intact, MS 5/5 in all 4  Labs on Admission:  Basic Metabolic Panel:  Recent Labs Lab 05/13/14 0955  NA 138  K 4.2  CL 101  CO2 25  GLUCOSE 113*  BUN 27*  CREATININE 1.32*  CALCIUM 9.3   Liver Function Tests: No results for input(s): AST, ALT, ALKPHOS, BILITOT, PROT, ALBUMIN in the last 168 hours. No results for input(s): LIPASE, AMYLASE in the last 168 hours. No  results for input(s): AMMONIA in the last 168 hours. CBC:  Recent Labs Lab 05/13/14 0955  WBC 6.2  HGB 12.5  HCT 38.9  MCV 94.4  PLT 223   Cardiac Enzymes: No results for input(s): CKTOTAL, CKMB, CKMBINDEX, TROPONINI in the last 168 hours.  BNP (last 3 results)  Recent Labs  03/11/14 2233 05/13/14 0955  BNP 94.7 168.6*    ProBNP (last 3 results) No results for input(s): PROBNP in the last 8760 hours.  CBG: No results for input(s): GLUCAP in the last 168 hours.  Radiological Exams on Admission: Dg Chest 2 View (if Patient Has Fever And/or Copd)  05/13/2014   CLINICAL DATA:  Wheezing, productive cough, shortness of breath. History of asthma and breast cancer.  EXAM: CHEST  2 VIEW  COMPARISON:  03/11/2014; 08/15/2013; 07/14/2013  FINDINGS: Grossly unchanged enlarged cardiac silhouette and mediastinal contours with atherosclerotic plaque within the thoracic aorta. The lungs appear mildly hyperexpanded with mild diffuse thickening of the pulmonary interstitium. There is persistent pleural parenchymal thickening about the bilateral major fissures. No pleural effusion or pneumothorax. No discrete focal airspace opacities. No evidence of edema. Unchanged bones including lower cervical ACDF, incompletely evaluated. Surgical clips overlying the left breast and right upper abdominal quadrant.  IMPRESSION: Cardiomegaly and lung hyperexpansion/bronchitic change without acute cardiopulmonary disease.   Electronically Signed   By: Sandi Mariscal M.D.   On: 05/13/2014 10:57    EKG: Independently reviewed. Sinus rhythm  Assessment/Plan Active Problems:   Hypertension   Paroxysmal atrial fibrillation   CKD (chronic kidney disease) stage 3, GFR 50-56 ml/min   Diastolic heart failure   Chronic anticoagulation   COPD exacerbation   COPD with exacerbation   COPD exacerbation - patient presenting with respiratory distress,  with cough, chest congestion, more wheezing that started last night,  progressively getting worse. She will be admitted to telemetry, start levofloxacin, IV steroids, as well as breathing treatments scheduled. Closely  monitor respiratory status. Provide oxygen as needed. - Given sick family contacts, will rule out influenza  Acute hypoxemic respiratory failure -in the setting of #1, oxygen support as needed.   Chronic diastolic heart failure - she has mild elevation of her BNP, however clinically she does not look fluid overloaded. She has no lower extremity edema and no crackles on exam. Resume her Lasix tomorrow morning.  Chronic kidney disease, stage III - patient's creatinine is close to baseline, she is usually 1.2 - 1.4. Her creatinine is 1.3 on admission.  Atrial fibrillation - she is currently in sinus rhythm, continue Bystolic, Xarelto, Flecainide.   Diet: Heart healthy Fluids: none DVT Prophylaxis: On Xarelto   Code Status: DNR  Family Communication: d/w family bedside  Disposition Plan: admit to telemetry   Costin M. Cruzita Lederer, MD [facility] Pager [phone]  If 7PM-7AM, please contact night-coverage [email or web address] Password [email or web address] 05/13/2014, 2:10 PM

## 2014-05-13 NOTE — ED Provider Notes (Signed)
CSN: [id number]     Arrival date & time 05/13/14  8338 History   First MD Initiated Contact with Patient 05/13/14 [phone]     Chief Complaint  Patient presents with  . Cough  . Shortness of Breath     (Consider location/radiation/quality/duration/timing/severity/associated sxs/prior Treatment) HPI Comments: Patient is a 81 yo F PMHx significant for Asthma, A fib, HTN, COPD, CHF, GERD presenting to the for evaluation of shortness of breath, non-productive cough, wheezing, posttussive chest tightness that began last night. She has not tried her at home albuterol or other rescue medications today. Denies any fevers, chills, nausea, vomiting, diarrhea, abdominal pain, chest pain. Patient does not wear home O2 but states her doctor has recommended she go on home O2.    Past Medical History  Diagnosis Date  . Asthma   . Arthritis   . Hypertension   . Atrial fibrillation     a. 04/04/11  . Osteoarthritis     a. 03/28/11 - R Total Hip Arthroplasty  . Cataracts, both eyes   . Pneumonia   . Bronchitis   . Pleurisy   . Ankle swelling   . Kidney infection   . Frequent urination   . Excessive urination at night   . Weight gain   . CHF (congestive heart failure)   . Shortness of breath     doe-  . Anemia   . Anxiety     pt denies this  . GERD (gastroesophageal reflux disease)     [name] Prilosec  . Chronic headaches     no longer having headaches  . Macular degeneration   . Breast cancer 05/19/13    left breast stage IIA    Past Surgical History  Procedure Laterality Date  . Cardiac catheterization      1980's or 90's - reportedly normal  . Abdominal hysterectomy  1962  . Appendectomy  1954  . Cholecystectomy    . Back surgery  1990  . Cataract extraction bilateral w/ anterior vitrectomy  2007/2008  . Total hip arthroplasty  03/28/2011    Procedure: TOTAL HIP ARTHROPLASTY;  Surgeon: Yvette Rack., MD;  Location: [facility];  Service: Orthopedics;  Laterality: Right;  . Cardioversion   04/05/2011    Procedure: CARDIOVERSION;  Surgeon: Coralyn Mark, MD;  Location: [facility];  Service: Cardiovascular;  Laterality: N/A;  . Tonsillectomy  1943  . Adenoidectomy  1949  . Foot surgery  1947  . Thoracic disc surgery  2008  . Lumbar disc surgery  2010  . Knee surgery       both knees  . Shoulder surgery      left  . Total hip arthroplasty  03/28/2011    right  . Cardioversion N/A 06/19/2012    Procedure: CARDIOVERSION-bedside(3W36);  Surgeon: Lelon Perla, MD;  Location: [facility];  Service: Cardiovascular;  Laterality: N/A;  . Colonoscopy    . Breast lumpectomy with needle localization Left 07/14/2013    Procedure: BREAST LUMPECTOMY WITH NEEDLE LOCALIZATION;  Surgeon: Stark Klein, MD;  Location[facility];  Service: General;  Laterality: Left;   Family History  Problem Relation Age of Onset  . Diabetes Father     Father, Mother, 4 sisters (2 living)  . Heart attack Father     (Deceased)  . Heart failure Father     (deceased 62)  . Hypertension Father   . Stroke Mother     (deceased 45)  . Hypertension Mother   . Cancer  Sister 40    breast cancer  . Heart attack Sister   . Diabetes Sister   . Cancer Other 48    niece with breast cancer   History  Substance Use Topics  . Smoking status: Former Smoker -- 21 years    Types: Cigarettes    Quit date: 03/17/1970  . Smokeless tobacco: Never Used  . Alcohol Use: No   OB History    Obstetric Comments   Menses age 24 No pregnancies 3 step-children     Review of Systems  Respiratory: Positive for cough, chest tightness, shortness of breath and wheezing.   All other systems reviewed and are negative.     Allergies  Methocarbamol; Vicodin; Aspirin; Butazolidin; Celebrex; Codeine; Doripenem; Aspirin buf(alhyd-mghyd-cacar); and Temazepam  Home Medications   Prior to Admission medications   Medication Sig Start Date End Date Taking? Authorizing Provider  acetaminophen (TYLENOL) 500 MG tablet Take 1,000 mg by mouth  2 (two) times daily.   Yes Historical Provider, MD  albuterol (PROVENTIL HFA;VENTOLIN HFA) 108 (90 BASE) MCG/ACT inhaler Inhale 1 puff into the lungs every 6 (six) hours as needed for wheezing.    Yes Historical Provider, MD  amLODipine (NORVASC) 10 MG tablet Take 10 mg by mouth daily.   Yes Historical Provider, MD  budesonide-formoterol (SYMBICORT) 160-4.5 MCG/ACT inhaler Inhale 1 puff into the lungs 2 (two) times daily.    Yes Historical Provider, MD  flecainide (TAMBOCOR) 100 MG tablet Take 1 tablet (100 mg total) by mouth every 12 (twelve) hours. 11/11/12  Yes Thompson Grayer, MD  furosemide (LASIX) 20 MG tablet Take 20 mg by mouth daily.  09/22/12  Yes Thurnell Lose, MD  IRON PO Liquid Iron - Take 1 teaspoon a day in the mornings   Yes Historical Provider, MD  losartan (COZAAR) 100 MG tablet Take 100 mg by mouth daily.   Yes Historical Provider, MD  Multiple Vitamin (MULTIVITAMINS PO) Take 1 tablet by mouth daily.   Yes Historical Provider, MD  Multiple Vitamins-Minerals (ICAPS PO) Take 1 tablet by mouth 2 (two) times daily.   Yes Historical Provider, MD  nebivolol (BYSTOLIC) 10 MG tablet Take 20 mg by mouth daily.   Yes Historical Provider, MD  omeprazole (PRILOSEC) 20 MG capsule Take 20 mg by mouth daily.   Yes Historical Provider, MD  rivaroxaban (XARELTO) 20 MG TABS tablet Take 1 tablet (20 mg total) by mouth daily. 02/23/14  Yes Thompson Grayer, MD  chlorzoxazone (PARAFON) 500 MG tablet Take 250 mg by mouth 3 (three) times daily as needed for muscle spasms.    Historical Provider, MD   BP 147/49 mmHg  Pulse 84  Temp(Src) 99.1 F (37.3 C) (Oral)  Resp 14  Ht 5\' 6"  (1.676 m)  Wt 253 lb 9.6 oz (115.032 kg)  BMI 40.95 kg/m2  SpO2 91% Physical Exam  Constitutional: She is oriented to person, place, and time. She appears well-developed and well-nourished. Nasal cannula in place.  HENT:  Head: Normocephalic and atraumatic.  Right Ear: External ear normal.  Left Ear: External ear normal.   Nose: Nose normal.  Mouth/Throat: No oropharyngeal exudate.  Eyes: Conjunctivae are normal.  Neck: Neck supple.  Cardiovascular: Normal rate, regular rhythm and normal heart sounds.   Pulmonary/Chest: Effort normal. No tachypnea. She has decreased breath sounds (bases bilaterally). She has wheezes (inspiratory and expiratory upper lung fields).  Abdominal: Soft. There is no tenderness.  Musculoskeletal: Normal range of motion. She exhibits no edema.  Neurological: She is  alert and oriented to person, place, and time.  Skin: Skin is warm and dry.  Nursing note and vitals reviewed.   ED Course  Procedures (including critical care time) Medications  levofloxacin (LEVAQUIN) tablet 500 mg (500 mg Oral Given 05/13/14 1446)    Followed by  levofloxacin (LEVAQUIN) tablet 250 mg (not administered)  sodium chloride 0.9 % injection 3 mL (not administered)  methylPREDNISolone sodium succinate (SOLU-MEDROL) 125 mg/2 mL injection 60 mg (not administered)  levalbuterol (XOPENEX) nebulizer solution 0.63 mg (not administered)  ipratropium (ATROVENT) nebulizer solution 0.5 mg (not administered)  budesonide (PULMICORT) nebulizer solution 0.25 mg (not administered)  rivaroxaban (XARELTO) tablet 20 mg (not administered)  ipratropium-albuterol (DUONEB) 0.5-2.5 (3) MG/3ML nebulizer solution 3 mL (3 mLs Nebulization Given 05/13/14 1047)  methylPREDNISolone sodium succinate (SOLU-MEDROL) 125 mg/2 mL injection 125 mg (125 mg Intravenous Given 05/13/14 1101)  ipratropium-albuterol (DUONEB) 0.5-2.5 (3) MG/3ML nebulizer solution 3 mL (3 mLs Nebulization Given 05/13/14 1131)  ipratropium-albuterol (DUONEB) 0.5-2.5 (3) MG/3ML nebulizer solution 3 mL (3 mLs Nebulization Given 05/13/14 1242)  albuterol (PROVENTIL) (2.5 MG/3ML) 0.083% nebulizer solution 5 mg (5 mg Nebulization Given 05/13/14 1354)    Labs Review Labs Reviewed  BASIC METABOLIC PANEL - Abnormal; Notable for the following:    Glucose, Bld 113 (*)    BUN 27 (*)     Creatinine, Ser 1.32 (*)    GFR calc non Af Amer 38 (*)    GFR calc Af Amer 44 (*)    All other components within normal limits  BRAIN NATRIURETIC PEPTIDE - Abnormal; Notable for the following:    B Natriuretic Peptide 168.6 (*)    All other components within normal limits  CBC  INFLUENZA PANEL BY PCR (TYPE A & B, H1N1)  I-STAT TROPOININ, ED    Imaging Review Dg Chest 2 View (if Patient Has Fever And/or Copd)  05/13/2014   CLINICAL DATA:  Wheezing, productive cough, shortness of breath. History of asthma and breast cancer.  EXAM: CHEST  2 VIEW  COMPARISON:  03/11/2014; 08/15/2013; 07/14/2013  FINDINGS: Grossly unchanged enlarged cardiac silhouette and mediastinal contours with atherosclerotic plaque within the thoracic aorta. The lungs appear mildly hyperexpanded with mild diffuse thickening of the pulmonary interstitium. There is persistent pleural parenchymal thickening about the bilateral major fissures. No pleural effusion or pneumothorax. No discrete focal airspace opacities. No evidence of edema. Unchanged bones including lower cervical ACDF, incompletely evaluated. Surgical clips overlying the left breast and right upper abdominal quadrant.  IMPRESSION: Cardiomegaly and lung hyperexpansion/bronchitic change without acute cardiopulmonary disease.   Electronically Signed   By: Sandi Mariscal M.D.   On: 05/13/2014 10:57     EKG Interpretation   Date/Time:  Saturday May 13 2014 09:51:51 EDT Ventricular Rate:  64 PR Interval:  175 QRS Duration: 112 QT Interval:  401 QTC Calculation: 414 R Axis:   60 Text Interpretation:  Sinus rhythm Borderline intraventricular conduction  delay No significant change was found Confirmed by Wyvonnia Dusky  MD, STEPHEN  [phone]) on 05/13/2014 10:24:30 AM      Patient unable to keep O2 saturations above 90% while ambulating, dropped to 85% with increased work of breathing.   Wheezing has improved with duoneb administration.   MDM   Final diagnoses:   COPD exacerbation    Filed Vitals:   05/13/14 1445  BP: 147/49  Pulse: 84  Temp:   Resp: 14   Afebrile, NAD, non-toxic appearing, AAOx4.  I have reviewed nursing notes, vital signs, and all appropriate lab  and imaging results for this patient. Patient presented to the emergency department for shortness breath, chest tightness, wheezing since last evening. Upon arrival decreased breath sounds at bases bilaterally. Patient improved after duoneb administration, continues to wheeze with hypoxia upon ambulation, despite 3 DuoNeb administration and Solu-Medrol. Will admit patient to the hospital for further management and evaluation. Patient d/w with Dr. Wyvonnia Dusky, agrees with plan.      Baron Sane, PA-C 05/13/14 [place], MD 05/13/14 [phone]

## 2014-05-13 NOTE — ED Notes (Signed)
Pt O2 levels dropped to 85% while ambulating in the hallway.

## 2014-05-14 DIAGNOSIS — I480 Paroxysmal atrial fibrillation: Secondary | ICD-10-CM

## 2014-05-14 DIAGNOSIS — I4891 Unspecified atrial fibrillation: Secondary | ICD-10-CM

## 2014-05-14 DIAGNOSIS — J101 Influenza due to other identified influenza virus with other respiratory manifestations: Secondary | ICD-10-CM

## 2014-05-14 LAB — BASIC METABOLIC PANEL
Anion gap: 15 (ref 5–15)
BUN: 32 mg/dL — ABNORMAL HIGH (ref 6–23)
CALCIUM: 8.6 mg/dL (ref 8.4–10.5)
CHLORIDE: 101 mmol/L (ref 96–112)
CO2: 21 mmol/L (ref 19–32)
CREATININE: 1.32 mg/dL — AB (ref 0.50–1.10)
GFR calc Af Amer: 44 mL/min — ABNORMAL LOW (ref >90)
GFR calc non Af Amer: 38 mL/min — ABNORMAL LOW (ref >90)
Glucose, Bld: 147 mg/dL — ABNORMAL HIGH (ref 70–99)
Potassium: 4.1 mmol/L (ref 3.5–5.1)
Sodium: 137 mmol/L (ref 135–145)

## 2014-05-14 LAB — CBC
HEMATOCRIT: 37.6 % (ref 36.0–46.0)
Hemoglobin: 11.9 g/dL — ABNORMAL LOW (ref 12.0–15.0)
MCH: 30.1 pg (ref 26.0–34.0)
MCHC: 31.6 g/dL (ref 30.0–36.0)
MCV: 94.9 fL (ref 78.0–100.0)
Platelets: 222 10*3/uL (ref 150–400)
RBC: 3.96 MIL/uL (ref 3.87–5.11)
RDW: 14.2 % (ref 11.5–15.5)
WBC: 5.9 10*3/uL (ref 4.0–10.5)

## 2014-05-14 MED ORDER — IPRATROPIUM-ALBUTEROL 0.5-2.5 (3) MG/3ML IN SOLN
3.0000 mL | RESPIRATORY_TRACT | Status: DC
  Administered 2014-05-14 – 2014-05-15 (×6): 3 mL via RESPIRATORY_TRACT
  Filled 2014-05-14 (×7): qty 3

## 2014-05-14 MED ORDER — GUAIFENESIN ER 600 MG PO TB12
600.0000 mg | ORAL_TABLET | Freq: Two times a day (BID) | ORAL | Status: DC
  Administered 2014-05-14 – 2014-05-15 (×3): 600 mg via ORAL
  Filled 2014-05-14 (×4): qty 1

## 2014-05-14 MED ORDER — OSELTAMIVIR PHOSPHATE 30 MG PO CAPS
30.0000 mg | ORAL_CAPSULE | Freq: Two times a day (BID) | ORAL | Status: DC
  Administered 2014-05-14 – 2014-05-15 (×4): 30 mg via ORAL
  Filled 2014-05-14 (×5): qty 1

## 2014-05-14 MED ORDER — ACETAMINOPHEN 500 MG PO TABS
1000.0000 mg | ORAL_TABLET | Freq: Two times a day (BID) | ORAL | Status: DC
  Administered 2014-05-14 – 2014-05-15 (×3): 1000 mg via ORAL
  Filled 2014-05-14 (×4): qty 2

## 2014-05-14 MED ORDER — ALBUTEROL SULFATE (2.5 MG/3ML) 0.083% IN NEBU: 2.5000 mg | INHALATION_SOLUTION | RESPIRATORY_TRACT | Status: DC | PRN

## 2014-05-14 NOTE — Progress Notes (Signed)
[facility] PROGRESS NOTE  Jordan Byrd RSW:[id number] DOB: 12/03/1933 DOA: 05/13/2014 PCP: Jordan Peaches, MD  Brief narrative 81 year old female with history of COPD (not on home O2, sees Dr. Annamaria Boots), hypertension, A. fib on Xarelto, diastolic CHF and history of breast cancer status post lumpectomy presented with shortness of breath with productive cough and wheezing. She also reported sick contact with her son having similar symptoms recently. Patient admitted for COPD exacerbation and found to have influenza A.  Assessment/Plan: Acute hypoxic respiratory failure secondary to COPD exacerbation Likely triggered by underlying flu. Clinically improving. Continue with IV Solu-Medrol. Will switch to duonebs and when necessary albuterol neb. Continue Pulmicort nebulizer twice a day. Continue empiric Levaquin. Supportive care with Tylenol and antitussives. Monitor need for home O2 prior to discharge.  Influenza A Added empiric Tamiflu.  Chronic diastolic CHF Has trace bilateral edema. Resume home dose Lasix. BNP mildly elevated. Monitor I/O and daily weight.  Chronic kidney disease stage III Renal function at baseline.  A. fib Stable. Continue by bystolic, Xarelto and flecainide  DVT prophylaxis: On Xarelto Diet: Heart healthy   Code Status: Full code Family Communication: None at bedside Disposition Plan: Continue inpatient monitoring. Home likely in 1-2 days if clinically improving   Consultants:  None  Procedures:  None  Antibiotics:  Levaquin since 4/9  HPI/Subjective: Patient seen and examined. Reports her dyspnea to be better. Complains of nonproductive cough. Started on Tamiflu as flu positive.  Objective: Filed Vitals:   05/14/14 0500  BP: 167/66  Pulse: 80  Temp:   Resp: 20    Intake/Output Summary (Last 24 hours) at 05/14/14 1034 Last data filed at 05/14/14 0851  Gross per 24 hour  Intake    480 ml  Output    600 ml  Net   -120 ml   Filed  Weights   05/13/14 1006 05/13/14 1629 05/14/14 0500  Weight: 115.032 kg (253 lb 9.6 oz) 115.8 kg (255 lb 4.7 oz) 115.849 kg (255 lb 6.4 oz)    Exam:   General:  Elderly female in no acute distress  HEENT: No pallor, moist oral mucosa, no JVD, supple neck  Chest: Scattered expiratory wheezing bilaterally, no crackles  Cardiovascular: S1 and S2 irregular, no murmurs rub or gallop  GI: Soft, nondistended, nontender, bowel sounds present  Extremities: Warm, trace edema bilaterally  CNS: Alert and oriented   Data Reviewed: Basic Metabolic Panel:  Recent Labs Lab 05/13/14 0955 05/14/14 0550  NA 138 137  K 4.2 4.1  CL 101 101  CO2 25 21  GLUCOSE 113* 147*  BUN 27* 32*  CREATININE 1.32* 1.32*  CALCIUM 9.3 8.6   Liver Function Tests: No results for input(s): AST, ALT, ALKPHOS, BILITOT, PROT, ALBUMIN in the last 168 hours. No results for input(s): LIPASE, AMYLASE in the last 168 hours. No results for input(s): AMMONIA in the last 168 hours. CBC:  Recent Labs Lab 05/13/14 0955 05/14/14 0550  WBC 6.2 5.9  HGB 12.5 11.9*  HCT 38.9 37.6  MCV 94.4 94.9  PLT 223 222   Cardiac Enzymes: No results for input(s): CKTOTAL, CKMB, CKMBINDEX, TROPONINI in the last 168 hours. BNP (last 3 results)  Recent Labs  03/11/14 2233 05/13/14 0955  BNP 94.7 168.6*    ProBNP (last 3 results) No results for input(s): PROBNP in the last 8760 hours.  CBG: No results for input(s): GLUCAP in the last 168 hours.  No results found for this or any previous visit (from the past  240 hour(s)).   Studies: Dg Chest 2 View (if Patient Has Fever And/or Copd)  05/13/2014   CLINICAL DATA:  Wheezing, productive cough, shortness of breath. History of asthma and breast cancer.  EXAM: CHEST  2 VIEW  COMPARISON:  03/11/2014; 08/15/2013; 07/14/2013  FINDINGS: Grossly unchanged enlarged cardiac silhouette and mediastinal contours with atherosclerotic plaque within the thoracic aorta. The lungs appear  mildly hyperexpanded with mild diffuse thickening of the pulmonary interstitium. There is persistent pleural parenchymal thickening about the bilateral major fissures. No pleural effusion or pneumothorax. No discrete focal airspace opacities. No evidence of edema. Unchanged bones including lower cervical ACDF, incompletely evaluated. Surgical clips overlying the left breast and right upper abdominal quadrant.  IMPRESSION: Cardiomegaly and lung hyperexpansion/bronchitic change without acute cardiopulmonary disease.   Electronically Signed   By: Sandi Mariscal M.D.   On: 05/13/2014 10:57    Scheduled Meds: . acetaminophen  1,000 mg Oral BID  . amLODipine  10 mg Oral Daily  . antiseptic oral rinse  7 mL Mouth Rinse BID  . budesonide (PULMICORT) nebulizer solution  0.25 mg Nebulization BID  . flecainide  100 mg Oral Q12H  . furosemide  20 mg Oral Daily  . guaiFENesin  600 mg Oral BID  . ipratropium-albuterol  3 mL Nebulization Q4H  . levofloxacin  250 mg Oral Daily  . losartan  50 mg Oral Daily  . methylPREDNISolone (SOLU-MEDROL) injection  60 mg Intravenous 3 times per day  . nebivolol  20 mg Oral Daily  . oseltamivir  30 mg Oral BID  . pantoprazole  40 mg Oral Daily  . rivaroxaban  20 mg Oral Q supper  . sodium chloride  3 mL Intravenous Q12H   Continuous Infusions:     Time spent: 25 minutes    Tiffanie Blassingame, [place]  [facility] Pager [phone]. If 7PM-7AM, please contact night-coverage at [email or web address], password [facility] 05/14/2014, 10:34 AM  LOS: 1 day

## 2014-05-14 NOTE — Progress Notes (Signed)
[facility] notified that pt is Positive for Influenza type A and H1N1. New orders received. Arthor Captain LPN

## 2014-05-15 MED ORDER — GUAIFENESIN ER 600 MG PO TB12: 600.0000 mg | ORAL_TABLET | Freq: Two times a day (BID) | ORAL | Status: DC

## 2014-05-15 MED ORDER — OSELTAMIVIR PHOSPHATE 30 MG PO CAPS: 30.0000 mg | ORAL_CAPSULE | Freq: Two times a day (BID) | ORAL | Status: AC

## 2014-05-15 MED ORDER — ALBUTEROL SULFATE (2.5 MG/3ML) 0.083% IN NEBU: 2.5000 mg | INHALATION_SOLUTION | Freq: Four times a day (QID) | RESPIRATORY_TRACT | Status: DC | PRN

## 2014-05-15 MED ORDER — PREDNISONE 50 MG PO TABS: ORAL_TABLET | ORAL | Status: AC

## 2014-05-15 MED ORDER — LEVOFLOXACIN 500 MG PO TABS: 500.0000 mg | ORAL_TABLET | Freq: Every day | ORAL | Status: AC

## 2014-05-15 NOTE — Discharge Summary (Addendum)
Physician Discharge Summary  Jordan Byrd:[id number] DOB: [birth year] DOA: 05/13/2014  PCP: Jordan Peaches, MD   Pulmonologist: Dr Baird Lyons  Admit date: 05/13/2014 Discharge date: 05/15/2014  Time spent: 30 minutes  Recommendations for Outpatient Follow-up:  1. Discharge home with PCP follow up in 1 wee  Discharge Diagnoses:  Principal Problem:   Acute hypoxic respiratory failure secondary to COPD exacerbation  Active Problems:   Influenza A H1N1 infection   Hypertension, essential    Paroxysmal atrial fibrillation   CKD (chronic kidney disease) stage 3, GFR 18-84 ml/min   Diastolic heart failure     Discharge Condition: fair  Diet recommendation: heart healthy  Filed Weights   05/13/14 1629 05/14/14 0500 05/15/14 0341  Weight: 115.8 kg (255 lb 4.7 oz) 115.849 kg (255 lb 6.4 oz) 116.3 kg (256 lb 6.3 oz)    History of present illness:  Please refer to admission H&P for details, in brief, 81 year old female with history of COPD (not on home O2, sees Dr. Annamaria Byrd), hypertension, A. fib on Xarelto, diastolic CHF and history of breast cancer status post lumpectomy presented with shortness of breath with productive cough and wheezing. She also reported sick contact with her son having similar symptoms recently. Patient admitted for COPD exacerbation and found to have influenza A.  Hospital Course:  Acute hypoxic respiratory failure secondary to COPD exacerbation Likely triggered by underlying flu. Clinically improving. Switch to 5 day course of oral prednisone  Placed on  nebs and levaquin. Supportive care with Tylenol and antitussives. sats stable on RA. doesnot need home o2. continue Symbicort inhaler bid. Refuses to take spiriva as it makes her very sick. continue prn albuterol inhaler. Will prescribe albuterol nebulizer for prn use.   Influenza A Treating with a 5 day course of tamiflu  Chronic diastolic CHF Resumed home dose Lasix. BNP mildly elevated on  admission. Leg edema improved. Continue bystolic, ARB and lasix.  Chronic kidney disease stage III Renal function at baseline.  A. fib Stable. Continue by bystolic, Xarelto and flecainide  HTN  stable. continue home meds  Pt clinically stable to discharge home with outpt follow up   Code Status: Full code Family Communication: None at bedside  Disposition Plan: d/c home with PCP follow up   Consultants:  None  Procedures:  None  Antibiotics:  Levaquin for 5 day course until 4/14  tamiflu until 4/14  Discharge Exam: Filed Vitals:   05/15/14 0933  BP: 140/61  Pulse: 75  Temp:   Resp:      General: Elderly female in no acute distress  HEENT: No pallor, moist oral mucosa, no JVD, supple neck  Chest: clear  bilaterally, no crackles, rhonchi or wheeze  Cardiovascular: S1 and S2 irregular, no murmurs rub or gallop  GI: Soft, nondistended, nontender, bowel sounds present  Extremities: bilateral edema resolved  CNS: Alert and oriented  Discharge Instructions   Discharge Instructions    DME Nebulizer machine    Complete by:  As directed           Current Discharge Medication List    START taking these medications   Details  albuterol (PROVENTIL) (2.5 MG/3ML) 0.083% nebulizer solution Take 3 mLs (2.5 mg total) by nebulization every 6 (six) hours as needed for wheezing or shortness of breath. Qty: 75 mL, Refills: 5    guaiFENesin (MUCINEX) 600 MG 12 hr tablet Take 1 tablet (600 mg total) by mouth 2 (two) times daily. Qty: 10 tablet, Refills: 0  levofloxacin (LEVAQUIN) 500 MG tablet Take 1 tablet (500 mg total) by mouth daily. Qty: 3 tablet, Refills: 0    oseltamivir (TAMIFLU) 30 MG capsule Take 1 capsule (30 mg total) by mouth 2 (two) times daily. Qty: 8 capsule, Refills: 0  Prednisone 50 mg tablet                       Take 1 tablet ( 50 mg) by mouth daily for 5 days                                                                  QTy: 5  tablets, no refills.    CONTINUE these medications which have NOT CHANGED   Details  acetaminophen (TYLENOL) 500 MG tablet Take 1,000 mg by mouth 2 (two) times daily.    albuterol (PROVENTIL HFA;VENTOLIN HFA) 108 (90 BASE) MCG/ACT inhaler Inhale 1 puff into the lungs every 6 (six) hours as needed for wheezing.     amLODipine (NORVASC) 10 MG tablet Take 10 mg by mouth daily.    budesonide-formoterol (SYMBICORT) 160-4.5 MCG/ACT inhaler Inhale 1 puff into the lungs 2 (two) times daily.     flecainide (TAMBOCOR) 100 MG tablet Take 1 tablet (100 mg total) by mouth every 12 (twelve) hours. Qty: 180 tablet, Refills: 3    furosemide (LASIX) 20 MG tablet Take 20 mg by mouth daily.     IRON PO Liquid Iron - Take 1 teaspoon a day in the mornings    losartan (COZAAR) 100 MG tablet Take 100 mg by mouth daily.    Multiple Vitamin (MULTIVITAMINS PO) Take 1 tablet by mouth daily.    Multiple Vitamins-Minerals (ICAPS PO) Take 1 tablet by mouth 2 (two) times daily.    nebivolol (BYSTOLIC) 10 MG tablet Take 20 mg by mouth daily.    omeprazole (PRILOSEC) 20 MG capsule Take 20 mg by mouth daily.    rivaroxaban (XARELTO) 20 MG TABS tablet Take 1 tablet (20 mg total) by mouth daily. Qty: 90 tablet, Refills: 3   Associated Diagnoses: PAF (paroxysmal atrial fibrillation)    chlorzoxazone (PARAFON) 500 MG tablet Take 250 mg by mouth 3 (three) times daily as needed for muscle spasms.       Allergies  Allergen Reactions  . Methocarbamol Hives  . Vicodin [Hydrocodone-Acetaminophen] Other (See Comments)    Interacts with bp medication and drops blood pressure  . Aspirin Nausea And Vomiting  . Butazolidin [Phenylbutazone] Other (See Comments)    unknown  . Celebrex [Celecoxib] Nausea And Vomiting    Nausea and vomiting  . Codeine Nausea And Vomiting  . Doripenem Other (See Comments)    unknown  . Aspirin Buf(Alhyd-Mghyd-Cacar) Nausea And Vomiting  . Temazepam Other (See Comments)    unknown    Follow-up Information    Follow up with GREEN, EDWIN JAY, MD. Schedule an appointment as soon as possible for a visit in 1 week.   Specialty:  Internal Medicine   Contact information:   [address] [place] [address] [phone]        The results of significant diagnostics from this hospitalization (including imaging, microbiology, ancillary and laboratory) are listed below for reference.    Significant Diagnostic Studies: Dg  Chest 2 View (if Patient Has Fever And/or Copd)  05/13/2014   CLINICAL DATA:  Wheezing, productive cough, shortness of breath. History of asthma and breast cancer.  EXAM: CHEST  2 VIEW  COMPARISON:  03/11/2014; 08/15/2013; 07/14/2013  FINDINGS: Grossly unchanged enlarged cardiac silhouette and mediastinal contours with atherosclerotic plaque within the thoracic aorta. The lungs appear mildly hyperexpanded with mild diffuse thickening of the pulmonary interstitium. There is persistent pleural parenchymal thickening about the bilateral major fissures. No pleural effusion or pneumothorax. No discrete focal airspace opacities. No evidence of edema. Unchanged bones including lower cervical ACDF, incompletely evaluated. Surgical clips overlying the left breast and right upper abdominal quadrant.  IMPRESSION: Cardiomegaly and lung hyperexpansion/bronchitic change without acute cardiopulmonary disease.   Electronically Signed   By: Sandi Mariscal M.D.   On: 05/13/2014 10:57    Microbiology: No results found for this or any previous visit (from the past 240 hour(s)).   Labs: Basic Metabolic Panel:  Recent Labs Lab 05/13/14 0955 05/14/14 0550  NA 138 137  K 4.2 4.1  CL 101 101  CO2 25 21  GLUCOSE 113* 147*  BUN 27* 32*  CREATININE 1.32* 1.32*  CALCIUM 9.3 8.6   Liver Function Tests: No results for input(s): AST, ALT, ALKPHOS, BILITOT, PROT, ALBUMIN in the last 168 hours. No results for input(s): LIPASE, AMYLASE in the last 168 hours. No  results for input(s): AMMONIA in the last 168 hours. CBC:  Recent Labs Lab 05/13/14 0955 05/14/14 0550  WBC 6.2 5.9  HGB 12.5 11.9*  HCT 38.9 37.6  MCV 94.4 94.9  PLT 223 222   Cardiac Enzymes: No results for input(s): CKTOTAL, CKMB, CKMBINDEX, TROPONINI in the last 168 hours. BNP: BNP (last 3 results)  Recent Labs  03/11/14 2233 05/13/14 0955  BNP 94.7 168.6*    ProBNP (last 3 results) No results for input(s): PROBNP in the last 8760 hours.  CBG: No results for input(s): GLUCAP in the last 168 hours.     SignedLouellen Molder  [facility] 05/15/2014, 10:33 AM

## 2014-05-15 NOTE — Progress Notes (Signed)
SATURATION QUALIFICATIONS: (This note is used to comply with regulatory documentation for home oxygen)  Patient Saturations on Room Air at Rest = 96%  Patient Saturations on Room Air while Ambulating = 91%  Patient Saturations on 3 Liters of oxygen while Ambulating = 98%  Please briefly explain why patient needs home oxygen:  While ambulating pt became extremely SOB and audible wheezing. Pt needed to sit down and return to bed because she could not tolerate ambulation any further. Pt O2 sats only rose when on 3 L [place] after 3 min of resting in bed.

## 2014-05-15 NOTE — Discharge Instructions (Signed)

## 2014-05-15 NOTE — Progress Notes (Signed)
Pt has orders to be discharged. Discharge instructions given and pt has no additional questions at this time. IV removed and site in good condition. Pt stable and waiting to be wheeled out.  Maurene Capes RN

## 2014-05-15 NOTE — Care Management Note (Signed)
    Page 1 of 1   05/15/2014     1:49:23 PM CARE MANAGEMENT NOTE 05/15/2014  Patient:  Jordan Byrd, Jordan Byrd   Account Number:  [id number]  Date Initiated:  05/15/2014  Documentation initiated by:  Saya Mccoll  Subjective/Objective Assessment:   Pt adm on 05/13/14 with COPD exacerbation.  PTA, pt independent, lives at home.     Action/Plan:   Pt for dc home today; no dc needs identified.  Pt does not qualify for home oxygen.   Anticipated DC Date:  05/15/2014   Anticipated DC Plan:  [place]  CM consult      Choice offered to / List presented to:             Status of service:  Completed, signed off Medicare Important Message given?  NA - LOS <3 / Initial given by admissions (If response is "NO", the following Medicare IM given date fields will be blank) Date Medicare IM given:   Medicare IM given by:   Date Additional Medicare IM given:   Additional Medicare IM given by:    Discharge Disposition:  HOME/SELF CARE  Per UR Regulation:  Reviewed for med. necessity/level of care/duration of stay  If discussed at [place] of Stay Meetings, dates discussed:    Comments:

## 2014-05-18 ENCOUNTER — Telehealth: Payer: Self-pay | Admitting: Internal Medicine

## 2014-05-18 NOTE — Telephone Encounter (Signed)
Ok to order compressor nebulizer, # 1, through DME  [facility] for her lung diagnosis

## 2014-05-18 NOTE — Telephone Encounter (Signed)
Sheryn Bison [facility] mgmt) calling about getting neb machine ordered for patient, as well, I informed her that this was already address by patients son and that a message has been sent to CY. She is requesting we follow up with her as well, cb number is [phone]

## 2014-05-18 NOTE — Telephone Encounter (Signed)
Spoke with pt's EC Ronnie, states a neb was already ordered and will be delivered between 5-9 today.  This was ordered by a hospitalist doc.   Left detailed message on Ann Coley's confidential vm to let her know.  Nothing further needed.

## 2014-05-18 NOTE — Telephone Encounter (Signed)
Called spoke with Edd Arbour (pt son). Pt was given an albuterol neb for home but has no machine. He is wanting an order sent to [facility] for this. He called pt PCP and was told this would need to come from [place]. We did not RX the albuterol but the hospital did. Please advise CDY thanks

## 2014-05-23 ENCOUNTER — Encounter: Payer: Self-pay | Admitting: Nurse Practitioner

## 2014-05-23 ENCOUNTER — Inpatient Hospital Stay (HOSPITAL_COMMUNITY)
Admission: EM | Admit: 2014-05-23 | Discharge: 2014-05-27 | DRG: 291 | Disposition: A | Discharge status: Home / Self Care | Payer: PPO | Attending: Internal Medicine | Admitting: Internal Medicine

## 2014-05-23 ENCOUNTER — Encounter (HOSPITAL_COMMUNITY): Payer: Self-pay | Admitting: *Deleted

## 2014-05-23 ENCOUNTER — Emergency Department (HOSPITAL_COMMUNITY): Payer: PPO

## 2014-05-23 ENCOUNTER — Ambulatory Visit (INDEPENDENT_AMBULATORY_CARE_PROVIDER_SITE_OTHER): Payer: PPO | Admitting: Nurse Practitioner

## 2014-05-23 VITALS — BP 124/62 | HR 62 | Ht 66.0 in | Wt 244.4 lb

## 2014-05-23 DIAGNOSIS — I480 Paroxysmal atrial fibrillation: Secondary | ICD-10-CM | POA: Diagnosis not present

## 2014-05-23 DIAGNOSIS — N183 Chronic kidney disease, stage 3 unspecified: Secondary | ICD-10-CM | POA: Diagnosis present

## 2014-05-23 DIAGNOSIS — Z853 Personal history of malignant neoplasm of breast: Secondary | ICD-10-CM | POA: Diagnosis not present

## 2014-05-23 DIAGNOSIS — Z9842 Cataract extraction status, left eye: Secondary | ICD-10-CM

## 2014-05-23 DIAGNOSIS — J9601 Acute respiratory failure with hypoxia: Secondary | ICD-10-CM

## 2014-05-23 DIAGNOSIS — H3530 Unspecified macular degeneration: Secondary | ICD-10-CM | POA: Diagnosis present

## 2014-05-23 DIAGNOSIS — C50912 Malignant neoplasm of unspecified site of left female breast: Secondary | ICD-10-CM

## 2014-05-23 DIAGNOSIS — Z885 Allergy status to narcotic agent status: Secondary | ICD-10-CM | POA: Diagnosis not present

## 2014-05-23 DIAGNOSIS — Z9841 Cataract extraction status, right eye: Secondary | ICD-10-CM

## 2014-05-23 DIAGNOSIS — T380X5A Adverse effect of glucocorticoids and synthetic analogues, initial encounter: Secondary | ICD-10-CM | POA: Diagnosis present

## 2014-05-23 DIAGNOSIS — Z886 Allergy status to analgesic agent status: Secondary | ICD-10-CM | POA: Diagnosis not present

## 2014-05-23 DIAGNOSIS — I5032 Chronic diastolic (congestive) heart failure: Secondary | ICD-10-CM

## 2014-05-23 DIAGNOSIS — D72829 Elevated white blood cell count, unspecified: Secondary | ICD-10-CM | POA: Diagnosis present

## 2014-05-23 DIAGNOSIS — F419 Anxiety disorder, unspecified: Secondary | ICD-10-CM | POA: Diagnosis present

## 2014-05-23 DIAGNOSIS — R0602 Shortness of breath: Secondary | ICD-10-CM

## 2014-05-23 DIAGNOSIS — I5033 Acute on chronic diastolic (congestive) heart failure: Secondary | ICD-10-CM | POA: Diagnosis not present

## 2014-05-23 DIAGNOSIS — Z170 Estrogen receptor positive status [ER+]: Secondary | ICD-10-CM | POA: Diagnosis not present

## 2014-05-23 DIAGNOSIS — Z87891 Personal history of nicotine dependence: Secondary | ICD-10-CM

## 2014-05-23 DIAGNOSIS — Z961 Presence of intraocular lens: Secondary | ICD-10-CM | POA: Diagnosis present

## 2014-05-23 DIAGNOSIS — Z7952 Long term (current) use of systemic steroids: Secondary | ICD-10-CM

## 2014-05-23 DIAGNOSIS — E669 Obesity, unspecified: Secondary | ICD-10-CM | POA: Diagnosis present

## 2014-05-23 DIAGNOSIS — M1611 Unilateral primary osteoarthritis, right hip: Secondary | ICD-10-CM | POA: Diagnosis present

## 2014-05-23 DIAGNOSIS — J189 Pneumonia, unspecified organism: Secondary | ICD-10-CM | POA: Diagnosis present

## 2014-05-23 DIAGNOSIS — H269 Unspecified cataract: Secondary | ICD-10-CM | POA: Diagnosis present

## 2014-05-23 DIAGNOSIS — Z888 Allergy status to other drugs, medicaments and biological substances status: Secondary | ICD-10-CM | POA: Diagnosis not present

## 2014-05-23 DIAGNOSIS — Z96641 Presence of right artificial hip joint: Secondary | ICD-10-CM | POA: Diagnosis present

## 2014-05-23 DIAGNOSIS — I129 Hypertensive chronic kidney disease with stage 1 through stage 4 chronic kidney disease, or unspecified chronic kidney disease: Secondary | ICD-10-CM | POA: Diagnosis present

## 2014-05-23 DIAGNOSIS — J45909 Unspecified asthma, uncomplicated: Secondary | ICD-10-CM | POA: Diagnosis present

## 2014-05-23 DIAGNOSIS — Z9049 Acquired absence of other specified parts of digestive tract: Secondary | ICD-10-CM | POA: Diagnosis present

## 2014-05-23 DIAGNOSIS — I10 Essential (primary) hypertension: Secondary | ICD-10-CM | POA: Diagnosis present

## 2014-05-23 DIAGNOSIS — J441 Chronic obstructive pulmonary disease with (acute) exacerbation: Secondary | ICD-10-CM | POA: Diagnosis not present

## 2014-05-23 DIAGNOSIS — Z6841 Body Mass Index (BMI) 40.0 and over, adult: Secondary | ICD-10-CM

## 2014-05-23 DIAGNOSIS — Z8701 Personal history of pneumonia (recurrent): Secondary | ICD-10-CM | POA: Diagnosis not present

## 2014-05-23 DIAGNOSIS — J449 Chronic obstructive pulmonary disease, unspecified: Secondary | ICD-10-CM | POA: Insufficient documentation

## 2014-05-23 DIAGNOSIS — Z90710 Acquired absence of both cervix and uterus: Secondary | ICD-10-CM

## 2014-05-23 LAB — CBC WITH DIFFERENTIAL/PLATELET
BASOS ABS: 0 10*3/uL (ref 0.0–0.1)
BASOS PCT: 0 % (ref 0–1)
Eosinophils Absolute: 0.1 10*3/uL (ref 0.0–0.7)
Eosinophils Relative: 1 % (ref 0–5)
HCT: 39.1 % (ref 36.0–46.0)
HEMOGLOBIN: 12.3 g/dL (ref 12.0–15.0)
Lymphocytes Relative: 12 % (ref 12–46)
Lymphs Abs: 1.3 10*3/uL (ref 0.7–4.0)
MCH: 29.9 pg (ref 26.0–34.0)
MCHC: 31.5 g/dL (ref 30.0–36.0)
MCV: 94.9 fL (ref 78.0–100.0)
MONO ABS: 0.2 10*3/uL (ref 0.1–1.0)
MONOS PCT: 2 % — AB (ref 3–12)
NEUTROS ABS: 9.2 10*3/uL — AB (ref 1.7–7.7)
NEUTROS PCT: 85 % — AB (ref 43–77)
Platelets: 283 10*3/uL (ref 150–400)
RBC: 4.12 MIL/uL (ref 3.87–5.11)
RDW: 14.5 % (ref 11.5–15.5)
WBC: 10.8 10*3/uL — ABNORMAL HIGH (ref 4.0–10.5)

## 2014-05-23 LAB — COMPREHENSIVE METABOLIC PANEL
ALT: 25 U/L (ref 0–35)
ANION GAP: 10 (ref 5–15)
AST: 23 U/L (ref 0–37)
Albumin: 3.8 g/dL (ref 3.5–5.2)
Alkaline Phosphatase: 66 U/L (ref 39–117)
BILIRUBIN TOTAL: 0.8 mg/dL (ref 0.3–1.2)
BUN: 37 mg/dL — ABNORMAL HIGH (ref 6–23)
CO2: 30 mmol/L (ref 19–32)
Calcium: 9.7 mg/dL (ref 8.4–10.5)
Chloride: 100 mmol/L (ref 96–112)
Creatinine, Ser: 1.45 mg/dL — ABNORMAL HIGH (ref 0.50–1.10)
GFR calc Af Amer: 39 mL/min — ABNORMAL LOW (ref >90)
GFR calc non Af Amer: 34 mL/min — ABNORMAL LOW (ref >90)
Glucose, Bld: 130 mg/dL — ABNORMAL HIGH (ref 70–99)
Potassium: 4.2 mmol/L (ref 3.5–5.1)
SODIUM: 140 mmol/L (ref 135–145)
TOTAL PROTEIN: 6 g/dL (ref 6.0–8.3)

## 2014-05-23 LAB — BRAIN NATRIURETIC PEPTIDE: B NATRIURETIC PEPTIDE 5: 40 pg/mL (ref 0.0–100.0)

## 2014-05-23 LAB — I-STAT TROPONIN, ED: Troponin i, poc: 0.01 ng/mL (ref 0.00–0.08)

## 2014-05-23 LAB — INFLUENZA PANEL BY PCR (TYPE A & B)
H1N1 flu by pcr: NOT DETECTED
INFLBPCR: NEGATIVE
Influenza A By PCR: NEGATIVE

## 2014-05-23 MED ORDER — METHYLPREDNISOLONE SODIUM SUCC 125 MG IJ SOLR
60.0000 mg | Freq: Three times a day (TID) | INTRAMUSCULAR | Status: DC
  Administered 2014-05-23 – 2014-05-25 (×7): 60 mg via INTRAVENOUS
  Filled 2014-05-23 (×5): qty 0.96
  Filled 2014-05-23: qty 2
  Filled 2014-05-23: qty 0.96

## 2014-05-23 MED ORDER — IPRATROPIUM-ALBUTEROL 0.5-2.5 (3) MG/3ML IN SOLN
3.0000 mL | RESPIRATORY_TRACT | Status: DC
  Administered 2014-05-23 (×2): 3 mL via RESPIRATORY_TRACT
  Filled 2014-05-23 (×2): qty 3

## 2014-05-23 MED ORDER — LEVOFLOXACIN IN D5W 750 MG/150ML IV SOLN: 750.0000 mg | INTRAVENOUS | Status: DC

## 2014-05-23 MED ORDER — METHYLPREDNISOLONE SODIUM SUCC 125 MG IJ SOLR
125.0000 mg | Freq: Once | INTRAMUSCULAR | Status: AC
  Administered 2014-05-23: 125 mg via INTRAVENOUS
  Filled 2014-05-23: qty 2

## 2014-05-23 MED ORDER — IPRATROPIUM-ALBUTEROL 0.5-2.5 (3) MG/3ML IN SOLN
3.0000 mL | Freq: Four times a day (QID) | RESPIRATORY_TRACT | Status: DC
  Administered 2014-05-24: 3 mL via RESPIRATORY_TRACT
  Filled 2014-05-23: qty 3

## 2014-05-23 MED ORDER — LEVOFLOXACIN IN D5W 750 MG/150ML IV SOLN
750.0000 mg | INTRAVENOUS | Status: DC
  Administered 2014-05-23 – 2014-05-25 (×2): 750 mg via INTRAVENOUS
  Filled 2014-05-23 (×3): qty 150

## 2014-05-23 MED ORDER — VANCOMYCIN HCL 10 G IV SOLR
2000.0000 mg | Freq: Once | INTRAVENOUS | Status: AC
  Administered 2014-05-23: 2000 mg via INTRAVENOUS
  Filled 2014-05-23: qty 2000

## 2014-05-23 MED ORDER — ENOXAPARIN SODIUM 60 MG/0.6ML ~~LOC~~ SOLN
60.0000 mg | SUBCUTANEOUS | Status: DC
  Administered 2014-05-23: 60 mg via SUBCUTANEOUS
  Filled 2014-05-23 (×2): qty 0.6

## 2014-05-23 MED ORDER — ENOXAPARIN SODIUM 30 MG/0.3ML ~~LOC~~ SOLN: 30.0000 mg | SUBCUTANEOUS | Status: DC

## 2014-05-23 MED ORDER — IPRATROPIUM BROMIDE 0.02 % IN SOLN
0.5000 mg | Freq: Once | RESPIRATORY_TRACT | Status: AC
  Administered 2014-05-23: 0.5 mg via RESPIRATORY_TRACT
  Filled 2014-05-23: qty 2.5

## 2014-05-23 MED ORDER — SODIUM CHLORIDE 0.9 % IJ SOLN
3.0000 mL | Freq: Two times a day (BID) | INTRAMUSCULAR | Status: DC
  Administered 2014-05-23 – 2014-05-27 (×8): 3 mL via INTRAVENOUS

## 2014-05-23 MED ORDER — ALBUTEROL (5 MG/ML) CONTINUOUS INHALATION SOLN
15.0000 mg/h | INHALATION_SOLUTION | Freq: Once | RESPIRATORY_TRACT | Status: AC
  Administered 2014-05-23: 15 mg/h via RESPIRATORY_TRACT
  Filled 2014-05-23: qty 20

## 2014-05-23 MED ORDER — VANCOMYCIN HCL 10 G IV SOLR
1250.0000 mg | INTRAVENOUS | Status: DC
  Filled 2014-05-23: qty 1250

## 2014-05-23 NOTE — H&P (Signed)
History and Physical  [name]:[id number] DOB: 1933-11-17 DOA: 05/23/2014  Referring physician: EDP PCP: Jordan Peaches, MD   Chief Complaint: wheezing/sob  HPI: Jordan Byrd is a 81 y.o. female  has a past medical history significant for COPD, prior smoker, hypertension, proxysimal atrial fibrillation on flecainide/nebivolol/xarelto, diastolic heart failure, history of breast cancer status post lumpectomy 2015 (no chemo or XRT), was sent from cardiology's office due to concerning of COPD exacerbation. Patient was recently treated for flu and copd exacerbation and was discharged home. She stated over the weekend, she became more sob, was seen by pmd and started back of steroids, she was seen at cardiology's office for regular check up, was found diffuse wheezing, and was sent to Ed for further evaluation. Patient reported nonproductive cough, denies fever, no chest pain, no edema, baseline limited mobility due to chronic DOE, reported has been on and off home oxygen in the past.  ED course: initial on presentation she is tachypneic with RR of 30, o2 sats on room air 92%, she was found diffuse wheezing, was started on nebulizer, iv steroids, cxr no acute findings, lab work with mild leukocytosis, mild worsening of cr comparing to baseline. TRH asked for admission for COPD exacerbation.   Review of Systems:  Detail per HPI, Review of systems are otherwise negative  Past Medical History  Diagnosis Date  . Asthma   . Arthritis   . Hypertension   . Atrial fibrillation     a. 04/04/11  . Osteoarthritis     a. 03/28/11 - R Total Hip Arthroplasty  . Cataracts, both eyes   . Pneumonia   . Bronchitis   . Pleurisy   . Ankle swelling   . Kidney infection   . Frequent urination   . Excessive urination at night   . Weight gain   . CHF (congestive heart failure)   . Shortness of breath     doe-  . Anemia   . Anxiety     pt denies this  . GERD (gastroesophageal reflux disease)     [name] Prilosec  . Chronic headaches     no longer having headaches  . Macular degeneration   . Breast cancer 05/19/13    left breast stage IIA    Past Surgical History  Procedure Laterality Date  . Cardiac catheterization      1980's or 90's - reportedly normal  . Abdominal hysterectomy  1962  . Appendectomy  1954  . Cholecystectomy    . Back surgery  1990  . Cataract extraction bilateral w/ anterior vitrectomy  2007/2008  . Total hip arthroplasty  03/28/2011    Procedure: TOTAL HIP ARTHROPLASTY;  Surgeon: Jordan Byrd., MD;  Location: [place];  Service: Orthopedics;  Laterality: Right;  . Cardioversion  04/05/2011    Procedure: CARDIOVERSION;  Surgeon: Jordan Mark, MD;  Location: [place];  Service: Cardiovascular;  Laterality: N/A;  . Tonsillectomy  1943  . Adenoidectomy  1949  . Foot surgery  1947  . Thoracic disc surgery  2008  . Lumbar disc surgery  2010  . Knee surgery       both knees  . Shoulder surgery      left  . Total hip arthroplasty  03/28/2011    right  . Cardioversion N/A 06/19/2012    Procedure: CARDIOVERSION-bedside(3W36);  Surgeon: Jordan Perla, MD;  Location: [place];  Service: Cardiovascular;  Laterality: N/A;  . Colonoscopy    . Breast  lumpectomy with needle localization Left 07/14/2013    Procedure: BREAST LUMPECTOMY WITH NEEDLE LOCALIZATION;  Surgeon: Jordan Klein, MD;  Location: [place];  Service: General;  Laterality: Left;   Social History:  reports that she quit smoking about 44 years ago. Her smoking use included Cigarettes. She quit after 21 years of use. She has never used smokeless tobacco. She reports that she does not drink alcohol or use illicit drugs. Patient lives at home & is able to participate in activities of daily living, but limited due to baseline DOE.  Allergies  Allergen Reactions  . Methocarbamol Hives  . Vicodin [Hydrocodone-Acetaminophen] Other (See Comments)    Interacts with bp medication and drops blood pressure  . Aspirin  Nausea And Vomiting  . Butazolidin [Phenylbutazone] Other (See Comments)    unknown  . Celebrex [Celecoxib] Nausea And Vomiting    Nausea and vomiting  . Codeine Nausea And Vomiting  . Doripenem Other (See Comments)    unknown  . Aspirin Buf(Alhyd-Mghyd-Cacar) Nausea And Vomiting  . Mucinex [Guaifenesin Er] Itching  . Temazepam Other (See Comments)    unknown    Family History  Problem Relation Age of Onset  . Diabetes Father     Father, Mother, 4 sisters (2 living)  . Heart attack Father     (Deceased)  . Heart failure Father     (deceased 57)  . Hypertension Father   . Stroke Mother     (deceased 12)  . Hypertension Mother   . Cancer Sister 108    breast cancer  . Heart attack Sister   . Diabetes Sister   . Cancer Other 74    niece with breast cancer     Prior to Admission medications   Medication Sig Start Date End Date Taking? Authorizing Provider  acetaminophen (TYLENOL) 500 MG tablet Take 1,000 mg by mouth 2 (two) times daily.   Yes Historical Provider, MD  albuterol (PROVENTIL HFA;VENTOLIN HFA) 108 (90 BASE) MCG/ACT inhaler Inhale 1 puff into the lungs every 6 (six) hours as needed for wheezing.    Yes Historical Provider, MD  albuterol (PROVENTIL) (2.5 MG/3ML) 0.083% nebulizer solution Take 3 mLs (2.5 mg total) by nebulization every 6 (six) hours as needed for wheezing or shortness of breath. 05/15/14  Yes Jordan Dhungel, MD  amLODipine (NORVASC) 10 MG tablet Take 10 mg by mouth daily.   Yes Historical Provider, MD  budesonide-formoterol (SYMBICORT) 160-4.5 MCG/ACT inhaler Inhale 2 puffs into the lungs 2 (two) times daily.    Yes Historical Provider, MD  chlorzoxazone (PARAFON) 500 MG tablet Take 250 mg by mouth 3 (three) times daily as needed for muscle spasms.   Yes Historical Provider, MD  flecainide (TAMBOCOR) 100 MG tablet Take 1 tablet (100 mg total) by mouth every 12 (twelve) hours. 11/11/12  Yes Jordan Grayer, MD  furosemide (LASIX) 20 MG tablet Take 20 mg by  mouth daily.  09/22/12  Yes Jordan Lose, MD  IRON PO Liquid Iron - Take 1 teaspoon a day in the mornings   Yes Historical Provider, MD  losartan (COZAAR) 100 MG tablet Take 100 mg by mouth daily.   Yes Historical Provider, MD  Multiple Vitamin (MULTIVITAMINS PO) Take 1 tablet by mouth daily.   Yes Historical Provider, MD  Multiple Vitamins-Minerals (ICAPS PO) Take 1 tablet by mouth 2 (two) times daily.   Yes Historical Provider, MD  nebivolol (BYSTOLIC) 10 MG tablet Take 20 mg by mouth daily.   Yes Historical Provider, MD  omeprazole (PRILOSEC) 20 MG capsule Take 20 mg by mouth daily.   Yes Historical Provider, MD  predniSONE (DELTASONE) 20 MG tablet Take 20 mg by mouth 2 (two) times daily with a meal.   Yes Historical Provider, MD  rivaroxaban (XARELTO) 20 MG TABS tablet Take 1 tablet (20 mg total) by mouth daily. 02/23/14  Yes Jordan Grayer, MD    Physical Exam: BP 133/43 mmHg  Pulse 75  Temp(Src) 98 F (36.7 C) (Oral)  Resp 23  SpO2 96%  General:  AAOx3 Eyes: PERRL ENT: unremarkable Neck: supple, no JVD Cardiovascular: RRR Respiratory: diffuse wheezing bilateral  Abdomen: soft/ND/ND, positive bowel sounds Skin: no rash Musculoskeletal:  No edema Psychiatric: calm/cooperative Neurologic:           Labs on Admission:  Basic Metabolic Panel:  Recent Labs Lab 05/23/14 1115  NA 140  K 4.2  CL 100  CO2 30  GLUCOSE 130*  BUN 37*  CREATININE 1.45*  CALCIUM 9.7   Liver Function Tests:  Recent Labs Lab 05/23/14 1115  AST 23  ALT 25  ALKPHOS 66  BILITOT 0.8  PROT 6.0  ALBUMIN 3.8   No results for input(s): LIPASE, AMYLASE in the last 168 hours. No results for input(s): AMMONIA in the last 168 hours. CBC:  Recent Labs Lab 05/23/14 1115  WBC 10.8*  NEUTROABS 9.2*  HGB 12.3  HCT 39.1  MCV 94.9  PLT 283   Cardiac Enzymes: No results for input(s): CKTOTAL, CKMB, CKMBINDEX, TROPONINI in the last 168 hours.  BNP (last 3 results)  Recent Labs   03/11/14 2233 05/13/14 0955 05/23/14 1252  BNP 94.7 168.6* 40.0    ProBNP (last 3 results) No results for input(s): PROBNP in the last 8760 hours.  CBG: No results for input(s): GLUCAP in the last 168 hours.  Radiological Exams on Admission: Dg Chest Port 1 View  05/23/2014   CLINICAL DATA:  Short of breath  EXAM: PORTABLE CHEST - 1 VIEW  COMPARISON:  05/13/2014  FINDINGS: The heart size and mediastinal contours are within normal limits. Both lungs are clear. The visualized skeletal structures are unremarkable.  IMPRESSION: No active disease.   Electronically Signed   By: Franchot Gallo M.D.   On: 05/23/2014 11:46    EKG: Independently reviewed. Sinus rhythm, no acute ST/T changes, nonspecific interventricular conduction delay  Assessment/Plan Present on Admission:  . COPD (chronic obstructive pulmonary disease)  COPD exacerbation: supplemental oxygen, abx ( vanc/levaquin due to recently hospitalization), nebs/ iv steroids, patient reported allergic to mucinex.  paroxysmal AFib, sinus rhythm, continue home meds. On tele.  chronic dCHF, currently euvolemic. Continue home lasix.  HTN: continue home meds.  H/o breast cancer (left) s/p lumpectomy (07/2013), per chart review, cancer was ER/PR positive, but patient declined adjuvant radiation, decline aromatase inhibitor.  DVt prophylaxis: lovenox 30mg  subQ  Consultants:  none  Code Status: full  Family Communication:  patient  Disposition Plan: admit to med tele  Time spent:  61mins  Sheldon Sem MD, PhD [facility] Pager [phone] If 7PM-7AM, please contact night-coverage at [email or web address], password [facility]

## 2014-05-23 NOTE — Progress Notes (Signed)
ANTIBIOTIC CONSULT NOTE - INITIAL  Pharmacy Consult for vancomycin and levaquin Indication: pneumonia  Allergies  Allergen Reactions  . Methocarbamol Hives  . Vicodin [Hydrocodone-Acetaminophen] Other (See Comments)    Interacts with bp medication and drops blood pressure  . Aspirin Nausea And Vomiting  . Butazolidin [Phenylbutazone] Other (See Comments)    unknown  . Celebrex [Celecoxib] Nausea And Vomiting    Nausea and vomiting  . Codeine Nausea And Vomiting  . Doripenem Other (See Comments)    unknown  . Aspirin Buf(Alhyd-Mghyd-Cacar) Nausea And Vomiting  . Temazepam Other (See Comments)    unknown    Patient Measurements:    Vital Signs: Temp: 98 F (36.7 C) (04/19 1042) Temp Source: Oral (04/19 1042) BP: 136/39 mmHg (04/19 1408) Pulse Rate: 78 (04/19 1408) Intake/Output from previous day:   Intake/Output from this shift:    Labs:  Recent Labs  05/23/14 1115  WBC 10.8*  HGB 12.3  PLT 283  CREATININE 1.45*   Estimated Creatinine Clearance: 40.3 mL/min (by C-G formula based on Cr of 1.45). No results for input(s): VANCOTROUGH, VANCOPEAK, VANCORANDOM, GENTTROUGH, GENTPEAK, GENTRANDOM, TOBRATROUGH, TOBRAPEAK, TOBRARND, AMIKACINPEAK, AMIKACINTROU, AMIKACIN in the last 72 hours.   Microbiology: No results found for this or any previous visit (from the past 720 hour(s)).  Medical History: Past Medical History  Diagnosis Date  . Asthma   . Arthritis   . Hypertension   . Atrial fibrillation     a. 04/04/11  . Osteoarthritis     a. 03/28/11 - R Total Hip Arthroplasty  . Cataracts, both eyes   . Pneumonia   . Bronchitis   . Pleurisy   . Ankle swelling   . Kidney infection   . Frequent urination   . Excessive urination at night   . Weight gain   . CHF (congestive heart failure)   . Shortness of breath     doe-  . Anemia   . Anxiety     pt denies this  . GERD (gastroesophageal reflux disease)     [name] Prilosec  . Chronic headaches     no longer  having headaches  . Macular degeneration   . Breast cancer 05/19/13    left breast stage IIA     Medications:  Anti-infectives    None     Assessment: 48 yoF with COPD and CKD presents via EMS from MD office with worsening SOB and wheezing since her recent discharge for COPD exacerbation. Pharmacy consulted to dose Levaquin and vancomycin for HCAP. Afebrile, WBC 10.8, SCr 1.45 (BL ~1.2), normalized CrCl ~35-40 ml/min)  4/19 sputum cx >>  Goal of Therapy:  Vancomycin trough level 15-20 mcg/ml  Plan:  Vanc 2 g IV load, then 1250 mg IV q24h Levaquin 750 mg IV q48h VTss if clinically indicated Monitor C&S, renal function, clinical progress  Jeanelle Malling 05/23/2014,4:16 PM

## 2014-05-23 NOTE — Progress Notes (Signed)
CARDIOLOGY OFFICE NOTE  Date:  05/23/2014    [name] Date of Birth: 09/12/1933 Medical Record #[id number]  PCP:  [name], MD  Cardiologist:  [facility]   Chief Complaint  Patient presents with  . Shortness of Breath    Follow up visit - seen for Dr. Rayann Heman  . Atrial Fibrillation     History of Present Illness: Jordan Byrd is a 81 y.o. female who presents today for a follow up visit. Seen for Dr. Rayann Heman. She has PAF, HTN, CKD and diastolic heart failure. No know CAD. Has had prior cardioversions for her atrial fib and in the past had been "highly symptomatic" but at other times has not been aware. Remains on flecainide. Now on Xarelto for anticoagulation.   Admitted last June of 2014 with acute on chronic diastolic HF. Echo was updated. Normal EF. She was diuresed and had worsening renal function. Was in sinus rhythm during that admission. Did have worsening renal function - referred to nephrology and taken off of her ACE and on lower dose of diuretic. Sees Dr. Justin Mend for renal.  Seen by Dr. Rayann Heman at the end of September of 2014 - for exertional SOB and chest tightness - apparently she had not stopped her ACE nor changed her dose of Lasix. Xarelto was cut back due to her CKD. Her HF was felt to be limited by her renal function. Lexiscan was ordered - this turned out to be low risk.   When I saw her in October of 2014 - was doing ok. Did tell me she was not going to go back and see Renal nor having IV iron.   Saw Dr. Rayann Heman in February of 2015 - was doing ok. Did end up getting a colonoscopy - found to have polys - on iron by PCP. Has since been diagnosed with breast cancer and has had lumpectomy.   Last saw Dr. Rayann Heman in September. Last saw me in June of 2015 - felt to be doing ok.  Comes back today. Here with her daughter today. She has recently been discharged with a COPD/flu/bronchitis illness. Saw PCP yesterday - started on steroid therapy. Daughter notes that  she will not do her nebs at home. Short of breath with very minimal activity. Cough but not productive. Says she does not qualify for oxygen. O2 sat here 93%. With walking it does not drop. She is very short of breath. Feels like she will pass out. She has no idea how her heart is doing.  Past Medical History  Diagnosis Date  . Asthma   . Arthritis   . Hypertension   . Atrial fibrillation     a. 04/04/11  . Osteoarthritis     a. 03/28/11 - R Total Hip Arthroplasty  . Cataracts, both eyes   . Pneumonia   . Bronchitis   . Pleurisy   . Ankle swelling   . Kidney infection   . Frequent urination   . Excessive urination at night   . Weight gain   . CHF (congestive heart failure)   . Shortness of breath     doe-  . Anemia   . Anxiety     pt denies this  . GERD (gastroesophageal reflux disease)     [name] Prilosec  . Chronic headaches     no longer having headaches  . Macular degeneration   . Breast cancer 05/19/13    left breast stage IIA     Past  Surgical History  Procedure Laterality Date  . Cardiac catheterization      1980's or 90's - reportedly normal  . Abdominal hysterectomy  1962  . Appendectomy  1954  . Cholecystectomy    . Back surgery  1990  . Cataract extraction bilateral w/ anterior vitrectomy  2007/2008  . Total hip arthroplasty  03/28/2011    Procedure: TOTAL HIP ARTHROPLASTY;  Surgeon: Yvette Rack., MD;  Location: [place];  Service: Orthopedics;  Laterality: Right;  . Cardioversion  04/05/2011    Procedure: CARDIOVERSION;  Surgeon: Coralyn Mark, MD;  Location: [place];  Service: Cardiovascular;  Laterality: N/A;  . Tonsillectomy  1943  . Adenoidectomy  1949  . Foot surgery  1947  . Thoracic disc surgery  2008  . Lumbar disc surgery  2010  . Knee surgery       both knees  . Shoulder surgery      left  . Total hip arthroplasty  03/28/2011    right  . Cardioversion N/A 06/19/2012    Procedure: CARDIOVERSION-bedside(3W36);  Surgeon: Lelon Perla, MD;   Location: [place];  Service: Cardiovascular;  Laterality: N/A;  . Colonoscopy    . Breast lumpectomy with needle localization Left 07/14/2013    Procedure: BREAST LUMPECTOMY WITH NEEDLE LOCALIZATION;  Surgeon: Stark Klein, MD;  Location: [place] [facility];  Service: General;  Laterality: Left;     Medications: Current Outpatient Prescriptions  Medication Sig Dispense Refill  . acetaminophen (TYLENOL) 500 MG tablet Take 1,000 mg by mouth 2 (two) times daily.    Marland Kitchen albuterol (PROVENTIL HFA;VENTOLIN HFA) 108 (90 BASE) MCG/ACT inhaler Inhale 1 puff into the lungs every 6 (six) hours as needed for wheezing.     Marland Kitchen albuterol (PROVENTIL) (2.5 MG/3ML) 0.083% nebulizer solution Take 3 mLs (2.5 mg total) by nebulization every 6 (six) hours as needed for wheezing or shortness of breath. 75 mL 5  . amLODipine (NORVASC) 10 MG tablet Take 10 mg by mouth daily.    . budesonide-formoterol (SYMBICORT) 160-4.5 MCG/ACT inhaler Inhale 2 puffs into the lungs 2 (two) times daily.     . chlorzoxazone (PARAFON) 500 MG tablet Take 250 mg by mouth 3 (three) times daily as needed for muscle spasms.    . flecainide (TAMBOCOR) 100 MG tablet Take 1 tablet (100 mg total) by mouth every 12 (twelve) hours. 180 tablet 3  . furosemide (LASIX) 20 MG tablet Take 20 mg by mouth daily.     . IRON PO Liquid Iron - Take 1 teaspoon a day in the mornings    . losartan (COZAAR) 100 MG tablet Take 100 mg by mouth daily.    . Multiple Vitamin (MULTIVITAMINS PO) Take 1 tablet by mouth daily.    . Multiple Vitamins-Minerals (ICAPS PO) Take 1 tablet by mouth 2 (two) times daily.    . nebivolol (BYSTOLIC) 10 MG tablet Take 20 mg by mouth daily.    Marland Kitchen omeprazole (PRILOSEC) 20 MG capsule Take 20 mg by mouth daily.    . predniSONE (DELTASONE) 20 MG tablet Take 20 mg by mouth 2 (two) times daily with a meal.    . rivaroxaban (XARELTO) 20 MG TABS tablet Take 1 tablet (20 mg total) by mouth daily. 90 tablet 3   No current facility-administered medications for  this visit.    Allergies: Allergies  Allergen Reactions  . Methocarbamol Hives  . Vicodin [Hydrocodone-Acetaminophen] Other (See Comments)    Interacts with bp medication and drops blood pressure  .  Aspirin Nausea And Vomiting  . Butazolidin [Phenylbutazone] Other (See Comments)    unknown  . Celebrex [Celecoxib] Nausea And Vomiting    Nausea and vomiting  . Codeine Nausea And Vomiting  . Doripenem Other (See Comments)    unknown  . Aspirin Buf(Alhyd-Mghyd-Cacar) Nausea And Vomiting  . Temazepam Other (See Comments)    unknown    Social History: The patient  reports that she quit smoking about 44 years ago. Her smoking use included Cigarettes. She quit after 21 years of use. She has never used smokeless tobacco. She reports that she does not drink alcohol or use illicit drugs.   Family History: The patient's family history includes Cancer (age of onset: 44) in her sister; Cancer (age of onset: 90) in her other; Diabetes in her father and sister; Heart attack in her father and sister; Heart failure in her father; Hypertension in her father and mother; Stroke in her mother.   Review of Systems: Please see the history of present illness.   Otherwise, the review of systems is positive for dyspnea.   All other systems are reviewed and negative.   Physical Exam: VS:  BP 124/62 mmHg  Pulse 62  Ht 5\' 6"  (1.676 m)  Wt 244 lb 6.4 oz (110.859 kg)  BMI 39.47 kg/m2  SpO2 93% .  BMI Body mass index is 39.47 kg/(m^2).  Wt Readings from Last 3 Encounters:  05/23/14 244 lb 6.4 oz (110.859 kg)  05/15/14 256 lb 6.3 oz (116.3 kg)  05/10/14 258 lb (117.028 kg)    General: She is in mild distress. Very short of breath with walking. Refused to be put in a wheelchair. Her weight is down 12 pounds. HEENT: Normal. Neck: Supple, no JVD, carotid bruits, or masses noted.  Cardiac: Regular rate and rhythm. Heart tones are distant.  Respiratory:  Lungs with diffuse wheezing with increased work of  breathing.  GI: Soft and nontender.  MS: No deformity or atrophy. Gait and ROM intact. Skin: Warm and dry. Color is normal.  Neuro:  Strength and sensation are intact and no gross focal deficits noted.  Psych: Alert, appropriate and with normal affect.   LABORATORY DATA:  EKG:  EKG is not ordered today.   Lab Results  Component Value Date   WBC 5.9 05/14/2014   HGB 11.9* 05/14/2014   HCT 37.6 05/14/2014   PLT 222 05/14/2014   GLUCOSE 147* 05/14/2014   CHOL 204* 04/05/2011   TRIG 211* 04/05/2011   HDL 26* 04/05/2011   LDLCALC 136* 04/05/2011   ALT 18 03/11/2014   AST 18 03/11/2014   NA 137 05/14/2014   K 4.1 05/14/2014   CL 101 05/14/2014   CREATININE 1.32* 05/14/2014   BUN 32* 05/14/2014   CO2 21 05/14/2014   TSH 1.307 09/19/2012   INR 1.07 04/04/2011    BNP (last 3 results)  Recent Labs  03/11/14 2233 05/13/14 0955  BNP 94.7 168.6*    ProBNP (last 3 results) No results for input(s): PROBNP in the last 8760 hours.   Other Studies Reviewed Today: MYOVIEW FROM October 2014  Overall Impression: This is a low risk scan. There is a moderate size area of decreased activity in the midanterior wall to the apex. This is fixed. Most likely this is artifact related to breast attenuation. There is no definite ischemia.  LV Ejection Fraction: 59%. LV Wall Motion: Normal Wall Motion  Dola Argyle, MD   ECHO Study Conclusions from June 2014  - Left ventricle:  The cavity size was mildly dilated. Wall thickness was normal. Systolic function was normal. The estimated ejection fraction was in the range of 55% to 60%. - Mitral valve: Mild regurgitation. - Left atrium: The atrium was mildly dilated.    Assessment / Plan:  1. Recent COPD/H1N47flu/bronchitis - seen by PCP yesterday and started on steroids. Not doing her nebs at home. Very short of breath. Very tight on exam. Audible wheezing at times. Sending to ER for further disposition. Not felt that she can be  safely sent back home.  2. Paroxysmal atrial fib - on Xarelto - remains on Flecainide as well - in sinus on physical exam today and have left her on her current regimen.   3. Chronic diastolic HF - seems compensated at this time.  4. Chest pain - no known CAD - low risk Myoview last year.   5. Progressive CKD   6.  Breast cancer -has had lumpectomy  Current medicines are reviewed with the patient today.  The patient does not have concerns regarding medicines other than what has been noted above.  The following changes have been made:  See above.  Labs/ tests ordered today include:   No orders of the defined types were placed in this encounter.     Disposition:   Sending to ER for further evaluation. EMS is called for transport. Update given to [facility] Triage.  Patient is agreeable to this plan and will call if any problems develop in the interim.   Signed: Burtis Junes, RN, ANP-C 05/23/2014 9:49 AM  [place] [address] [place], [place]  [address] Phone: [phone] Fax: [phone]

## 2014-05-23 NOTE — Progress Notes (Signed)
Received patient from ED, report called from [facility] [facility].  Patient oriented to room, call bell placed within reach.  Patient instructed not to get OOB unless calling for assistance.  BP 153/47 mmHg  Pulse 76  Temp(Src) 97.2 F (36.2 C) (Oral)  Resp 22  Ht 5\' 6"  (1.676 m)  Wt 115.123 kg (253 lb 12.8 oz)  BMI 40.98 kg/m2  SpO2 100% See full assessment in Epic.

## 2014-05-23 NOTE — ED Provider Notes (Signed)
CSN: [id number]     Arrival date & time 05/23/14  1037 History   First MD Initiated Contact with Patient 05/23/14 1038     Chief Complaint  Patient presents with  . Shortness of Breath     (Consider location/radiation/quality/duration/timing/severity/associated sxs/prior Treatment) The history is provided by the patient.  ALISSIA LORY is a 81 y.o. female hx of afib on xarelto, hypertension, COPD here presenting with shortness of breath. She was recently admitted for COPD exacerbation and positive flu A. she was discharged last week. However since discharge she has persistent shortness of breath. For the last 3 days that shows breath got worse and she has been having worsening shortness of breath on exertion. She has trouble walking to the bathroom because of her shortness of breath. Denies any fevers and still has some productive cough. Patient was given oral steroids from PMD yesterday with no relief. Has routine follow-up with cardiology today and was sent here for worsening shortness of breath.    Past Medical History  Diagnosis Date  . Asthma   . Arthritis   . Hypertension   . Atrial fibrillation     a. 04/04/11  . Osteoarthritis     a. 03/28/11 - R Total Hip Arthroplasty  . Cataracts, both eyes   . Pneumonia   . Bronchitis   . Pleurisy   . Ankle swelling   . Kidney infection   . Frequent urination   . Excessive urination at night   . Weight gain   . CHF (congestive heart failure)   . Shortness of breath     doe-  . Anemia   . Anxiety     pt denies this  . GERD (gastroesophageal reflux disease)     [name] Prilosec  . Chronic headaches     no longer having headaches  . Macular degeneration   . Breast cancer 05/19/13    left breast stage IIA    Past Surgical History  Procedure Laterality Date  . Cardiac catheterization      1980's or 90's - reportedly normal  . Abdominal hysterectomy  1962  . Appendectomy  1954  . Cholecystectomy    . Back surgery  1990  .  Cataract extraction bilateral w/ anterior vitrectomy  2007/2008  . Total hip arthroplasty  03/28/2011    Procedure: TOTAL HIP ARTHROPLASTY;  Surgeon: Yvette Rack., MD;  Location: [facility];  Service: Orthopedics;  Laterality: Right;  . Cardioversion  04/05/2011    Procedure: CARDIOVERSION;  Surgeon: Coralyn Mark, MD;  Location: [facility];  Service: Cardiovascular;  Laterality: N/A;  . Tonsillectomy  1943  . Adenoidectomy  1949  . Foot surgery  1947  . Thoracic disc surgery  2008  . Lumbar disc surgery  2010  . Knee surgery       both knees  . Shoulder surgery      left  . Total hip arthroplasty  03/28/2011    right  . Cardioversion N/A 06/19/2012    Procedure: CARDIOVERSION-bedside([id number]);  Surgeon: Lelon Perla, MD;  Location: [facility];  Service: Cardiovascular;  Laterality: N/A;  . Colonoscopy    . Breast lumpectomy with needle localization Left 07/14/2013    Procedure: BREAST LUMPECTOMY WITH NEEDLE LOCALIZATION;  Surgeon: Stark Klein, MD;  Location: [facility];  Service: General;  Laterality: Left;   Family History  Problem Relation Age of Onset  . Diabetes Father     Father, Mother, 4 sisters (2 living)  .  Heart attack Father     (Deceased)  . Heart failure Father     (deceased 86)  . Hypertension Father   . Stroke Mother     (deceased 88)  . Hypertension Mother   . Cancer Sister 30    breast cancer  . Heart attack Sister   . Diabetes Sister   . Cancer Other 50    niece with breast cancer   History  Substance Use Topics  . Smoking status: Former Smoker -- 21 years    Types: Cigarettes    Quit date: 03/17/1970  . Smokeless tobacco: Never Used  . Alcohol Use: No   OB History    Obstetric Comments   Menses age 72 No pregnancies 3 step-children     Review of Systems  Respiratory: Positive for shortness of breath.   All other systems reviewed and are negative.     Allergies  Methocarbamol; Vicodin; Aspirin; Butazolidin; Celebrex; Codeine; Doripenem; Aspirin  buf(alhyd-mghyd-cacar); and Temazepam  Home Medications   Prior to Admission medications   Medication Sig Start Date End Date Taking? Authorizing Provider  acetaminophen (TYLENOL) 500 MG tablet Take 1,000 mg by mouth 2 (two) times daily.    Historical Provider, MD  albuterol (PROVENTIL HFA;VENTOLIN HFA) 108 (90 BASE) MCG/ACT inhaler Inhale 1 puff into the lungs every 6 (six) hours as needed for wheezing.     Historical Provider, MD  albuterol (PROVENTIL) (2.5 MG/3ML) 0.083% nebulizer solution Take 3 mLs (2.5 mg total) by nebulization every 6 (six) hours as needed for wheezing or shortness of breath. 05/15/14   Nishant Dhungel, MD  amLODipine (NORVASC) 10 MG tablet Take 10 mg by mouth daily.    Historical Provider, MD  budesonide-formoterol (SYMBICORT) 160-4.5 MCG/ACT inhaler Inhale 2 puffs into the lungs 2 (two) times daily.     Historical Provider, MD  chlorzoxazone (PARAFON) 500 MG tablet Take 250 mg by mouth 3 (three) times daily as needed for muscle spasms.    Historical Provider, MD  flecainide (TAMBOCOR) 100 MG tablet Take 1 tablet (100 mg total) by mouth every 12 (twelve) hours. 11/11/12   [place] Grayer, MD  furosemide (LASIX) 20 MG tablet Take 20 mg by mouth daily.  09/22/12   Thurnell Lose, MD  IRON PO Liquid Iron - Take 1 teaspoon a day in the mornings    Historical Provider, MD  losartan (COZAAR) 100 MG tablet Take 100 mg by mouth daily.    Historical Provider, MD  Multiple Vitamin (MULTIVITAMINS PO) Take 1 tablet by mouth daily.    Historical Provider, MD  Multiple Vitamins-Minerals (ICAPS PO) Take 1 tablet by mouth 2 (two) times daily.    Historical Provider, MD  nebivolol (BYSTOLIC) 10 MG tablet Take 20 mg by mouth daily.    Historical Provider, MD  omeprazole (PRILOSEC) 20 MG capsule Take 20 mg by mouth daily.    Historical Provider, MD  predniSONE (DELTASONE) 20 MG tablet Take 20 mg by mouth 2 (two) times daily with a meal.    Historical Provider, MD  rivaroxaban (XARELTO) 20 MG  TABS tablet Take 1 tablet (20 mg total) by mouth daily. 02/23/14   [place] Grayer, MD   BP 137/42 mmHg  Pulse 72  Temp(Src) 98 F (36.7 C) (Oral)  Resp 28  SpO2 100% Physical Exam  Constitutional: She is oriented to person, place, and time.  Chronically ill, tachypneic   HENT:  Head: Normocephalic.  Mouth/Throat: Oropharynx is clear and moist.  Eyes: Conjunctivae are normal. Pupils  are equal, round, and reactive to light.  Neck: Normal range of motion. Neck supple.  Cardiovascular: Normal rate, regular rhythm and normal heart sounds.   Pulmonary/Chest:  Tachypneic, audible wheezing. + retractions   Abdominal: Soft. Bowel sounds are normal. She exhibits no distension. There is no tenderness. There is no rebound.  Musculoskeletal: Normal range of motion.  Neurological: She is alert and oriented to person, place, and time.  Skin: Skin is warm and dry.  Psychiatric: She has a normal mood and affect. Her behavior is normal. Judgment and thought content normal.  Nursing note and vitals reviewed.   ED Course  Procedures (including critical care time) Labs Review Labs Reviewed  CBC WITH DIFFERENTIAL/PLATELET - Abnormal; Notable for the following:    WBC 10.8 (*)    Neutrophils Relative % 85 (*)    Neutro Abs 9.2 (*)    Monocytes Relative 2 (*)    All other components within normal limits  COMPREHENSIVE METABOLIC PANEL - Abnormal; Notable for the following:    Glucose, Bld 130 (*)    BUN 37 (*)    Creatinine, Ser 1.45 (*)    GFR calc non Af Amer 34 (*)    GFR calc Af Amer 39 (*)    All other components within normal limits  BRAIN NATRIURETIC PEPTIDE  I-STAT TROPOININ, ED    Imaging Review Dg Chest Port 1 View  05/23/2014   CLINICAL DATA:  Short of breath  EXAM: PORTABLE CHEST - 1 VIEW  COMPARISON:  05/13/2014  FINDINGS: The heart size and mediastinal contours are within normal limits. Both lungs are clear. The visualized skeletal structures are unremarkable.  IMPRESSION: No  active disease.   Electronically Signed   By: Franchot Gallo M.D.   On: 05/23/2014 11:46     EKG Interpretation   Date/Time:  Tuesday May 23 2014 10:42:01 EDT Ventricular Rate:  66 PR Interval:  211 QRS Duration: 116 QT Interval:  396 QTC Calculation: 415 R Axis:   8 Text Interpretation:  Sinus rhythm Nonspecific intraventricular conduction  delay No significant change since last tracing Confirmed by YAO  MD, DAVID  ([id number]) on 05/23/2014 10:43:37 AM      MDM   Final diagnoses:  Shortness of breath    KANDA DELUNA is a 81 y.o. female here with SOB, wheezing. Likely worsening COPD. Not improving with PO steroids. Will check labs, CXR. Will give continuous neb and IV steroids. Will likely need admission.  12:25 PM Labs at baseline. CXR showed no pneumonia. Pulse ox 92% on RA and still tachypneic and unable to ambulate. Will admit for COPD exacerbation.      Wandra Arthurs, MD 05/23/14 1225

## 2014-05-23 NOTE — ED Notes (Signed)
Per EMS-pt comes from MD office c/o SOB and wheezing.  Pt hx COPD.  Pt 93% RA per report.  Pt receiving 5 mg albuterol on arrival.  VSS per EMS.  Pt a x4, NAD on arrival.

## 2014-05-23 NOTE — ED Notes (Signed)
Heart healthy lunch meal given.

## 2014-05-23 NOTE — ED Notes (Signed)
Report attempted 

## 2014-05-23 NOTE — ED Notes (Signed)
Daughter, Cleo, cell # [phone].

## 2014-05-23 NOTE — ED Notes (Signed)
Attempted to swab for influenza panel, pt declined, reports "I was treated for 2 types of flu last week, I don;'t want that up my nose again, it hurt".

## 2014-05-24 ENCOUNTER — Encounter (HOSPITAL_COMMUNITY): Payer: Self-pay | Admitting: General Practice

## 2014-05-24 DIAGNOSIS — J441 Chronic obstructive pulmonary disease with (acute) exacerbation: Secondary | ICD-10-CM | POA: Insufficient documentation

## 2014-05-24 DIAGNOSIS — J9601 Acute respiratory failure with hypoxia: Secondary | ICD-10-CM

## 2014-05-24 LAB — TROPONIN I
Troponin I: <0.03 ng/mL (ref <0.031)
Troponin I: <0.03 ng/mL (ref <0.031)

## 2014-05-24 LAB — CBC
HEMATOCRIT: 37.2 % (ref 36.0–46.0)
HEMOGLOBIN: 12.2 g/dL (ref 12.0–15.0)
MCH: 30.7 pg (ref 26.0–34.0)
MCHC: 32.8 g/dL (ref 30.0–36.0)
MCV: 93.7 fL (ref 78.0–100.0)
Platelets: 272 10*3/uL (ref 150–400)
RBC: 3.97 MIL/uL (ref 3.87–5.11)
RDW: 14.7 % (ref 11.5–15.5)
WBC: 12.6 10*3/uL — ABNORMAL HIGH (ref 4.0–10.5)

## 2014-05-24 LAB — BASIC METABOLIC PANEL
Anion gap: 16 — ABNORMAL HIGH (ref 5–15)
BUN: 42 mg/dL — AB (ref 6–23)
CO2: 21 mmol/L (ref 19–32)
Calcium: 8.9 mg/dL (ref 8.4–10.5)
Chloride: 101 mmol/L (ref 96–112)
Creatinine, Ser: 1.65 mg/dL — ABNORMAL HIGH (ref 0.50–1.10)
GFR calc Af Amer: 33 mL/min — ABNORMAL LOW (ref >90)
GFR calc non Af Amer: 29 mL/min — ABNORMAL LOW (ref >90)
GLUCOSE: 137 mg/dL — AB (ref 70–99)
Potassium: 4.6 mmol/L (ref 3.5–5.1)
SODIUM: 138 mmol/L (ref 135–145)

## 2014-05-24 LAB — HEMOGLOBIN A1C
Hgb A1c MFr Bld: 6 % — ABNORMAL HIGH (ref 4.8–5.6)
MEAN PLASMA GLUCOSE: 126 mg/dL

## 2014-05-24 MED ORDER — RIVAROXABAN 15 MG PO TABS
15.0000 mg | ORAL_TABLET | Freq: Every day | ORAL | Status: DC
  Administered 2014-05-24 – 2014-05-26 (×3): 15 mg via ORAL
  Filled 2014-05-24 (×4): qty 1

## 2014-05-24 MED ORDER — PANTOPRAZOLE SODIUM 40 MG PO TBEC
40.0000 mg | DELAYED_RELEASE_TABLET | Freq: Every day | ORAL | Status: DC
  Administered 2014-05-24 – 2014-05-27 (×4): 40 mg via ORAL
  Filled 2014-05-24 (×4): qty 1

## 2014-05-24 MED ORDER — NITROGLYCERIN 0.4 MG SL SUBL
SUBLINGUAL_TABLET | SUBLINGUAL | Status: AC
  Administered 2014-05-24: 0.4 mg via SUBLINGUAL
  Filled 2014-05-24: qty 1

## 2014-05-24 MED ORDER — IPRATROPIUM-ALBUTEROL 0.5-2.5 (3) MG/3ML IN SOLN
3.0000 mL | Freq: Four times a day (QID) | RESPIRATORY_TRACT | Status: DC
  Administered 2014-05-25 – 2014-05-27 (×6): 3 mL via RESPIRATORY_TRACT
  Filled 2014-05-24 (×10): qty 3

## 2014-05-24 MED ORDER — ALBUTEROL SULFATE (2.5 MG/3ML) 0.083% IN NEBU: 2.5000 mg | INHALATION_SOLUTION | RESPIRATORY_TRACT | Status: DC | PRN

## 2014-05-24 MED ORDER — IPRATROPIUM-ALBUTEROL 0.5-2.5 (3) MG/3ML IN SOLN: 3.0000 mL | Freq: Four times a day (QID) | RESPIRATORY_TRACT | Status: DC

## 2014-05-24 MED ORDER — NEBIVOLOL HCL 10 MG PO TABS
20.0000 mg | ORAL_TABLET | Freq: Every day | ORAL | Status: DC
  Administered 2014-05-25 – 2014-05-27 (×3): 20 mg via ORAL
  Filled 2014-05-24 (×3): qty 2

## 2014-05-24 MED ORDER — IPRATROPIUM-ALBUTEROL 0.5-2.5 (3) MG/3ML IN SOLN: 3.0000 mL | RESPIRATORY_TRACT | Status: DC | PRN

## 2014-05-24 MED ORDER — CETYLPYRIDINIUM CHLORIDE 0.05 % MT LIQD
7.0000 mL | Freq: Two times a day (BID) | OROMUCOSAL | Status: DC
  Administered 2014-05-24 – 2014-05-27 (×5): 7 mL via OROMUCOSAL

## 2014-05-24 MED ORDER — MORPHINE SULFATE 2 MG/ML IJ SOLN: 2.0000 mg | Freq: Once | INTRAMUSCULAR | Status: DC

## 2014-05-24 MED ORDER — FUROSEMIDE 20 MG PO TABS
20.0000 mg | ORAL_TABLET | Freq: Every day | ORAL | Status: DC
  Administered 2014-05-25: 20 mg via ORAL
  Filled 2014-05-24: qty 1

## 2014-05-24 MED ORDER — AMLODIPINE BESYLATE 10 MG PO TABS
10.0000 mg | ORAL_TABLET | Freq: Every day | ORAL | Status: DC
  Administered 2014-05-24 – 2014-05-27 (×4): 10 mg via ORAL
  Filled 2014-05-24 (×4): qty 1

## 2014-05-24 MED ORDER — IPRATROPIUM-ALBUTEROL 0.5-2.5 (3) MG/3ML IN SOLN: 3.0000 mL | Freq: Three times a day (TID) | RESPIRATORY_TRACT | Status: DC

## 2014-05-24 MED ORDER — FLECAINIDE ACETATE 100 MG PO TABS
100.0000 mg | ORAL_TABLET | Freq: Two times a day (BID) | ORAL | Status: DC
  Administered 2014-05-24 – 2014-05-27 (×7): 100 mg via ORAL
  Filled 2014-05-24 (×8): qty 1

## 2014-05-24 MED ORDER — NITROGLYCERIN 0.4 MG SL SUBL
0.4000 mg | SUBLINGUAL_TABLET | SUBLINGUAL | Status: DC | PRN
  Administered 2014-05-24: 0.4 mg via SUBLINGUAL

## 2014-05-24 MED ORDER — BUDESONIDE 0.5 MG/2ML IN SUSP
0.5000 mg | Freq: Two times a day (BID) | RESPIRATORY_TRACT | Status: DC
  Administered 2014-05-24 – 2014-05-27 (×6): 0.5 mg via RESPIRATORY_TRACT
  Filled 2014-05-24 (×8): qty 2

## 2014-05-24 MED ORDER — ARFORMOTEROL TARTRATE 15 MCG/2ML IN NEBU
15.0000 ug | INHALATION_SOLUTION | Freq: Two times a day (BID) | RESPIRATORY_TRACT | Status: DC
  Administered 2014-05-24 – 2014-05-27 (×6): 15 ug via RESPIRATORY_TRACT
  Filled 2014-05-24 (×8): qty 2

## 2014-05-24 NOTE — Progress Notes (Signed)
[facility] PROGRESS NOTE  Jordan Byrd UKG:[id number] DOB: [birth year] DOA: 05/23/2014 PCP: [name], MD  Brief Summary  Jordan Byrd is a 81 y.o. female with history of COPD, prior smoker, hypertension, proxysimal atrial fibrillation on flecainide/nebivolol/xarelto, diastolic heart failure, history of breast cancer status post lumpectomy 2015 (no chemo or XRT), was sent from cardiology's office due to concerning of COPD exacerbation. Patient was recently treated for flu and copd exacerbation and was discharged home but became more sob.  She was seen by pmd and started back on steroids but while at her cardiology appointment, she was wheezing and sent by EMS to the ER for evaluation.  Patient reported nonproductive cough, no fever, no chest pain, no edema, baseline limited mobility due to chronic DOE, reported has been on and off home oxygen in the past.  She was admitted for COPD exacerbation.    Assessment/Plan   acute hypoxic respiratory failure secondary to acute COPD exacerbation -   Continue IV steroids, nebulizer treatments -   Budesonide and Brovana -   Resume PPI -   Continue antibiotics but discontinue vancomycin -   I emailed her pulmonologist regarding her admission to the hospital -   Wean oxygen as tolerated -   She will need an ambulatory pulse ox prior to discharge -   Anticipate that she may need oxygen at discharge   paroxysmal atrial fibrillation, in normal sinus rhythm in the emergency department -   chads 2 vascular 3 , patient should be on anticoagulation -   Continue Xarelto -   Continue flecainide and nebivolol   Hypertension, blood pressure mildly elevated, likely secondary to steroids -   Continue Norvasc and beta blocker -   Hold ARB   Chronic diastolic heart failure, currently appears euvolemic -   Resume Lasix  H/o breast cancer (left) s/p lumpectomy (07/2013), per chart review, cancer was ER/PR positive, but patient declined adjuvant  radiation, decline aromatase inhibitor.   mild leukocytosis likely secondary to steroids, repeat as outaptient   Chronic kidney disease stage III , creatinine near baseline of 1.4-1.6 -   Renally dose medications and minimize nephrotoxins  Diet:   Low sodium Access:   PIV IVF:   off Proph:   Xarelto  Code Status:  full Family Communication:  Patient alone Disposition Plan:  Pending improvement in breathing, PT/OT   Consultants:   none  Procedures:   chest x-ray  Antibiotics:   vancomycin 4/19 -4/20   Levofloxacin 4/19 >>  HPI/Subjective:   improved shortness of breath and able to get around her room a little bit better today. Still wheezing and Jordan Byrd of breath at rest at the time of my exam. Is awaiting her nebulizer treatment. Denies current chest pain.    Objective: Filed Vitals:   05/24/14 0912 05/24/14 1015 05/24/14 1526 05/24/14 1626  BP:  118/54 165/63 160/71  Pulse:  89 80 77  Temp:   98.7 F (37.1 C)   TempSrc:   Oral   Resp:   18   Height:      Weight:      SpO2: 96% 96% 98%     Intake/Output Summary (Last 24 hours) at 05/24/14 1847 Last data filed at 05/24/14 1803  Gross per 24 hour  Intake   1160 ml  Output   1000 ml  Net    160 ml   Filed Weights   05/23/14 1845 05/24/14 0630  Weight: 115.123 kg (253 lb 12.8 oz) 115.35  kg (254 lb 4.8 oz)    Exam:   General:    Obese female  With mild respiratory distress with accessory muscle use including SCM retractions at rest  HEENT:  NCAT, MMM  Cardiovascular:  RRR, nl S1, S2 no mrg, 2+ pulses, warm extremities  Respiratory:   Diminished bilateral breath sounds , full expiratory wheeze with prolonged expiratory phase , no focal rales or rhonchi  Abdomen:   NABS, soft, NT/ND  MSK:   Normal tone and bulk, no LEE  Neuro:  Grossly intact  Data Reviewed: Basic Metabolic Panel:  Recent Labs Lab 05/23/14 1115 05/24/14 0413  NA 140 138  K 4.2 4.6  CL 100 101  CO2 30 21  GLUCOSE 130*  137*  BUN 37* 42*  CREATININE 1.45* 1.65*  CALCIUM 9.7 8.9   Liver Function Tests:  Recent Labs Lab 05/23/14 1115  AST 23  ALT 25  ALKPHOS 66  BILITOT 0.8  PROT 6.0  ALBUMIN 3.8   No results for input(s): LIPASE, AMYLASE in the last 168 hours. No results for input(s): AMMONIA in the last 168 hours. CBC:  Recent Labs Lab 05/23/14 1115 05/24/14 0413  WBC 10.8* 12.6*  NEUTROABS 9.2*  --   HGB 12.3 12.2  HCT 39.1 37.2  MCV 94.9 93.7  PLT 283 272   Cardiac Enzymes:  Recent Labs Lab 05/24/14 1135 05/24/14 1545  TROPONINI <0.03 <0.03   BNP (last 3 results)  Recent Labs  03/11/14 2233 05/13/14 0955 05/23/14 1252  BNP 94.7 168.6* 40.0    ProBNP (last 3 results) No results for input(s): PROBNP in the last 8760 hours.  CBG: No results for input(s): GLUCAP in the last 168 hours.  No results found for this or any previous visit (from the past 240 hour(s)).   Studies: Dg Chest Port 1 View  05/23/2014   CLINICAL DATA:  Jordan Byrd of breath  EXAM: PORTABLE CHEST - 1 VIEW  COMPARISON:  05/13/2014  FINDINGS: The heart size and mediastinal contours are within normal limits. Both lungs are clear. The visualized skeletal structures are unremarkable.  IMPRESSION: No active disease.   Electronically Signed   By: Franchot Gallo M.D.   On: 05/23/2014 11:46    Scheduled Meds: . amLODipine  10 mg Oral Daily  . antiseptic oral rinse  7 mL Mouth Rinse BID  . arformoterol  15 mcg Nebulization BID  . budesonide (PULMICORT) nebulizer solution  0.5 mg Nebulization BID  . flecainide  100 mg Oral BID  . [START ON 05/25/2014] furosemide  20 mg Oral Daily  . ipratropium-albuterol  3 mL Nebulization QID  . levofloxacin (LEVAQUIN) IV  750 mg Intravenous Q48H  . methylPREDNISolone (SOLU-MEDROL) injection  60 mg Intravenous 3 times per day  . pantoprazole  40 mg Oral Daily  . rivaroxaban  15 mg Oral Q supper  . sodium chloride  3 mL Intravenous Q12H   Continuous Infusions:   Active  Problems:   COPD (chronic obstructive pulmonary disease)    Time spent: 30 min    [name], [facility] Pager [phone]. If 7PM-7AM, please contact night-coverage at [email or web address], password [facility] 05/24/2014, 6:47 PM  LOS: 1 day

## 2014-05-24 NOTE — Progress Notes (Signed)
RN in room to round on patient. Patient c/o "indigestion" feeling in mid sternal area. States she normally takes sublingual nitroglycerin when she has this feeling. Dr Sheran Fava notified. Nitro 0.4mg  ordered and given. BP 118/54, P 89, O2 96% on 2L.  Prlim EKG NSR with LBBB. Results shown to Dr Sheran Fava. Patient on tele. Will continue to monitor.

## 2014-05-24 NOTE — Progress Notes (Signed)
I spoke to this patent regarding the use of her nebulizes and MDI's. She is concerned that the medication we are giving her her keeps her awake

## 2014-05-24 NOTE — Progress Notes (Signed)
Introduced self to pt as Teacher, music.  Call bell at reach and instructed to call for assistance.  Verbalized understanding.  Will continue to monitor.  Salah Nakamura, Therapist, sports.

## 2014-05-24 NOTE — Progress Notes (Signed)
Pt states she feels neb tx's kept her awake last night. Pt changed to Duoneb QID per RT protocol assessment score of 8, and will continue to Albuterol 2.5mg  Q2PRN. RT will continue to monitor.

## 2014-05-24 NOTE — Care Management Note (Unsigned)
    Page 1 of 1   05/26/2014     5:02:53 PM CARE MANAGEMENT NOTE 05/26/2014  Patient:  Jordan Byrd, Jordan Byrd   Account Number:  [id number]  Date Initiated:  05/24/2014  Documentation initiated by:  Detra Bores  Subjective/Objective Assessment:   Pt adm on 05/23/14 with COPD exacerbation.  PTA, pt resides at home alone.     Action/Plan:   Will follow for dc planning as pt progresses.  May need home oxygen.   Anticipated DC Date:  05/26/2014   Anticipated DC Plan:  [place]  CM consult      Choice offered to / List presented to:             Status of service:  In process, will continue to follow Medicare Important Message given?  YES (If response is "NO", the following Medicare IM given date fields will be blank) Date Medicare IM given:  05/26/2014 Medicare IM given by:  Remee Charley Date Additional Medicare IM given:   Additional Medicare IM given by:    Discharge Disposition:    Per UR Regulation:  Reviewed for med. necessity/level of care/duration of stay  If discussed at [place] of Stay Meetings, dates discussed:    Comments:

## 2014-05-24 NOTE — Progress Notes (Signed)
ANTICOAGULATION CONSULT NOTE - Initial Consult  Pharmacy Consult for xarelto Indication: atrial fibrillation  Allergies  Allergen Reactions  . Methocarbamol Hives  . Vicodin [Hydrocodone-Acetaminophen] Other (See Comments)    Interacts with bp medication and drops blood pressure  . Aspirin Nausea And Vomiting  . Butazolidin [Phenylbutazone] Other (See Comments)    unknown  . Celebrex [Celecoxib] Nausea And Vomiting    Nausea and vomiting  . Codeine Nausea And Vomiting  . Doripenem Other (See Comments)    unknown  . Aspirin Buf(Alhyd-Mghyd-Cacar) Nausea And Vomiting  . Mucinex [Guaifenesin Er] Itching  . Temazepam Other (See Comments)    unknown    Patient Measurements: Height: 5\' 6"  (167.6 cm) Weight: 254 lb 4.8 oz (115.35 kg) IBW/kg (Calculated) : 59.3  Vital Signs: Temp: 97.7 F (36.5 C) (04/20 0630) Temp Source: Oral (04/20 0630) BP: 118/54 mmHg (04/20 1015) Pulse Rate: 89 (04/20 1015)  Labs:  Recent Labs  05/23/14 1115 05/24/14 0413 05/24/14 1135  HGB 12.3 12.2  --   HCT 39.1 37.2  --   PLT 283 272  --   CREATININE 1.45* 1.65*  --   TROPONINI  --   --  <0.03    Estimated Creatinine Clearance: 36.2 mL/min (by C-G formula based on Cr of 1.65).   Medical History: Past Medical History  Diagnosis Date  . Asthma   . Arthritis   . Hypertension   . Atrial fibrillation     a. 04/04/11  . Osteoarthritis     a. 03/28/11 - R Total Hip Arthroplasty  . Cataracts, both eyes   . Pneumonia   . Bronchitis   . Pleurisy   . Ankle swelling   . Kidney infection   . Frequent urination   . Excessive urination at night   . Weight gain   . CHF (congestive heart failure)   . Shortness of breath     doe-  . Anemia   . Anxiety     pt denies this  . GERD (gastroesophageal reflux disease)     [name] Prilosec  . Chronic headaches     no longer having headaches  . Macular degeneration   . Breast cancer 05/19/13    left breast stage IIA     Medications:   Prescriptions prior to admission  Medication Sig Dispense Refill Last Dose  . acetaminophen (TYLENOL) 500 MG tablet Take 1,000 mg by mouth 2 (two) times daily.   05/22/2014 at Unknown time  . albuterol (PROVENTIL HFA;VENTOLIN HFA) 108 (90 BASE) MCG/ACT inhaler Inhale 1 puff into the lungs every 6 (six) hours as needed for wheezing.    05/23/2014 at Unknown time  . albuterol (PROVENTIL) (2.5 MG/3ML) 0.083% nebulizer solution Take 3 mLs (2.5 mg total) by nebulization every 6 (six) hours as needed for wheezing or shortness of breath. 75 mL 5 05/23/2014 at Unknown time  . amLODipine (NORVASC) 10 MG tablet Take 10 mg by mouth daily.   05/23/2014 at Unknown time  . budesonide-formoterol (SYMBICORT) 160-4.5 MCG/ACT inhaler Inhale 2 puffs into the lungs 2 (two) times daily.    05/23/2014 at Unknown time  . chlorzoxazone (PARAFON) 500 MG tablet Take 250 mg by mouth 3 (three) times daily as needed for muscle spasms.   05/23/2014 at Unknown time  . flecainide (TAMBOCOR) 100 MG tablet Take 1 tablet (100 mg total) by mouth every 12 (twelve) hours. 180 tablet 3 05/22/2014 at Unknown time  . furosemide (LASIX) 20 MG tablet Take 20 mg by mouth  daily.    05/23/2014 at Unknown time  . IRON PO Liquid Iron - Take 1 teaspoon a day in the mornings   05/23/2014 at Unknown time  . losartan (COZAAR) 100 MG tablet Take 100 mg by mouth daily.   05/23/2014 at Unknown time  . Multiple Vitamin (MULTIVITAMINS PO) Take 1 tablet by mouth daily.   05/23/2014 at Unknown time  . Multiple Vitamins-Minerals (ICAPS PO) Take 1 tablet by mouth 2 (two) times daily.   05/22/2014 at Unknown time  . nebivolol (BYSTOLIC) 10 MG tablet Take 20 mg by mouth daily.   05/23/2014 at Unknown time  . omeprazole (PRILOSEC) 20 MG capsule Take 20 mg by mouth daily.   05/23/2014 at Unknown time  . predniSONE (DELTASONE) 20 MG tablet Take 20 mg by mouth 2 (two) times daily with a meal.   05/23/2014 at Unknown time  . rivaroxaban (XARELTO) 20 MG TABS tablet Take 1 tablet  (20 mg total) by mouth daily. 90 tablet 3 05/23/2014 at Unknown time    Assessment: 81 yo lady admitted with COPD exacerbation to resume xarelto.  Her CrCl is 36 ml/min.   Goal of Therapy:  Adequate anticoagulation Monitor platelets by anticoagulation protocol: Yes   Plan:  Resume xarelto 15 mg po daily Monitor renal function Monitor for bleeding complications  Reiley Keisler [place] 05/24/2014,2:37 PM

## 2014-05-25 DIAGNOSIS — J9601 Acute respiratory failure with hypoxia: Secondary | ICD-10-CM

## 2014-05-25 DIAGNOSIS — N183 Chronic kidney disease, stage 3 (moderate): Secondary | ICD-10-CM

## 2014-05-25 MED ORDER — PREDNISONE 50 MG PO TABS
60.0000 mg | ORAL_TABLET | Freq: Every day | ORAL | Status: DC
  Administered 2014-05-26 – 2014-05-27 (×2): 60 mg via ORAL
  Filled 2014-05-25 (×3): qty 1

## 2014-05-25 MED ORDER — ENSURE ENLIVE PO LIQD
237.0000 mL | ORAL | Status: DC
  Administered 2014-05-25 – 2014-05-27 (×3): 237 mL via ORAL

## 2014-05-25 MED ORDER — FUROSEMIDE 10 MG/ML IJ SOLN
40.0000 mg | Freq: Two times a day (BID) | INTRAMUSCULAR | Status: DC
  Administered 2014-05-25 – 2014-05-27 (×4): 40 mg via INTRAVENOUS
  Filled 2014-05-25 (×5): qty 4

## 2014-05-25 MED ORDER — ADULT MULTIVITAMIN W/MINERALS CH
1.0000 | ORAL_TABLET | Freq: Every day | ORAL | Status: DC
  Administered 2014-05-25 – 2014-05-27 (×3): 1 via ORAL
  Filled 2014-05-25 (×3): qty 1

## 2014-05-25 MED ORDER — LOSARTAN POTASSIUM 50 MG PO TABS
100.0000 mg | ORAL_TABLET | Freq: Every day | ORAL | Status: DC
  Administered 2014-05-26 – 2014-05-27 (×2): 100 mg via ORAL
  Filled 2014-05-25 (×2): qty 2

## 2014-05-25 MED ORDER — ACETAMINOPHEN 325 MG PO TABS
650.0000 mg | ORAL_TABLET | Freq: Four times a day (QID) | ORAL | Status: DC | PRN
  Administered 2014-05-25 – 2014-05-27 (×4): 650 mg via ORAL
  Filled 2014-05-25 (×4): qty 2

## 2014-05-25 NOTE — Progress Notes (Signed)
[facility] PROGRESS NOTE  Jordan Byrd FIE:[id number] DOB: Jun 03, 1933 DOA: 05/23/2014 PCP: Jordan Peaches, MD  Brief Summary  Jordan Byrd is a 81 y.o. female with history of COPD, prior smoker, hypertension, proxysimal atrial fibrillation on flecainide/nebivolol/xarelto, diastolic heart failure, history of breast cancer status post lumpectomy 2015 (no chemo or XRT), was sent from cardiology's office due to concerning of COPD exacerbation. Patient was recently treated for flu and copd exacerbation and was discharged home but became more sob.  She was seen by pmd and started back on steroids but while at her cardiology appointment, she was wheezing and sent by EMS to the ER for evaluation.  Patient reported nonproductive cough, no fever, no chest pain, no edema, baseline limited mobility due to chronic DOE, reported has been on and off home oxygen in the past.  She was admitted for COPD exacerbation.    Assessment/Plan   acute hypoxic respiratory failure secondary to acute COPD exacerbation -    Discontinued IV steroids -   Start oral prednisone tomorrow -   continue nebulizer treatments -   Budesonide and Brovana -   Resume PPI -   Continue antibiotics but discontinue vancomycin -   I emailed her pulmonologist regarding her admission to the hospital -   Wean oxygen as tolerated -    Continuous pulse oximetry overnight   paroxysmal atrial fibrillation, in normal sinus rhythm in the emergency department -   chads 2 vascular 3 , patient should be on anticoagulation -   Continue Xarelto -   Continue flecainide and nebivolol   Hypertension, blood pressure mildly elevated, likely secondary to steroids -   Continue Norvasc and beta blocker -   Resume ARB   Chronic diastolic heart failure, now with some rales and trace LEE -    Start daily weights and strict ins and outs -   Discontinue oral Lasix and start Lasix 40 mg IV twice a day -   Trend creatinine carefully  H/o breast  cancer (left) s/p lumpectomy (07/2013), per chart review, cancer was ER/PR positive, but patient declined adjuvant radiation, decline aromatase inhibitor.   mild leukocytosis likely secondary to steroids, repeat as outaptient   Chronic kidney disease stage III, creatinine near baseline of 1.4-1.6 -   Renally dose medications and minimize nephrotoxins  Diet:   Low sodium Access:   PIV IVF:   off Proph:   Xarelto  Code Status:  full Family Communication:  Patient alone Disposition Plan:  Pending improvement in breathing, PT/OT   Consultants:   none  Procedures:   chest x-ray  Antibiotics:   vancomycin 4/19 -4/20   Levofloxacin 4/19 >>  HPI/Subjective:  Improved shortness of breath at rest, but still very SOB with minimal exertion.   Objective: Filed Vitals:   05/25/14 1022 05/25/14 1024 05/25/14 1047 05/25/14 1340  BP:   132/40 144/52  Pulse:   78 74  Temp:   98.1 F (36.7 C) 98 F (36.7 C)  TempSrc:   Oral Oral  Resp:    20  Height:      Weight:      SpO2: 94% 95% 93% 94%    Intake/Output Summary (Last 24 hours) at 05/25/14 1808 Last data filed at 05/25/14 1700  Gross per 24 hour  Intake   2363 ml  Output   1250 ml  Net   1113 ml   Filed Weights   05/23/14 1845 05/24/14 0630 05/25/14 0535  Weight: 115.123 kg (253 lb  12.8 oz) 115.35 kg (254 lb 4.8 oz) 114.715 kg (252 lb 14.4 oz)    Exam:   General:    Obese female, able to speak in complete sentences   HEENT:  NCAT, MMM  Cardiovascular:  RRR, nl S1, S2 no mrg, 2+ pulses, warm extremities  Respiratory:   Diminished bilateral breath sounds, full expiratory wheeze with prolonged expiratory phase, rales at bilateral bases, no rhonchi  Abdomen:   NABS, soft, NT/ND  MSK:   Normal tone and bulk, trace LEE  Neuro:  Grossly intact  Data Reviewed: Basic Metabolic Panel:  Recent Labs Lab 05/23/14 1115 05/24/14 0413  NA 140 138  K 4.2 4.6  CL 100 101  CO2 30 21  GLUCOSE 130* 137*  BUN 37*  42*  CREATININE 1.45* 1.65*  CALCIUM 9.7 8.9   Liver Function Tests:  Recent Labs Lab 05/23/14 1115  AST 23  ALT 25  ALKPHOS 66  BILITOT 0.8  PROT 6.0  ALBUMIN 3.8   No results for input(s): LIPASE, AMYLASE in the last 168 hours. No results for input(s): AMMONIA in the last 168 hours. CBC:  Recent Labs Lab 05/23/14 1115 05/24/14 0413  WBC 10.8* 12.6*  NEUTROABS 9.2*  --   HGB 12.3 12.2  HCT 39.1 37.2  MCV 94.9 93.7  PLT 283 272   Cardiac Enzymes:  Recent Labs Lab 05/24/14 1135 05/24/14 1545  TROPONINI <0.03 <0.03   BNP (last 3 results)  Recent Labs  03/11/14 2233 05/13/14 0955 05/23/14 1252  BNP 94.7 168.6* 40.0    ProBNP (last 3 results) No results for input(s): PROBNP in the last 8760 hours.  CBG: No results for input(s): GLUCAP in the last 168 hours.  No results found for this or any previous visit (from the past 240 hour(s)).   Studies: No results found.  Scheduled Meds: . amLODipine  10 mg Oral Daily  . antiseptic oral rinse  7 mL Mouth Rinse BID  . arformoterol  15 mcg Nebulization BID  . budesonide (PULMICORT) nebulizer solution  0.5 mg Nebulization BID  . feeding supplement (ENSURE ENLIVE)  237 mL Oral Q24H  . flecainide  100 mg Oral BID  . furosemide  20 mg Oral Daily  . ipratropium-albuterol  3 mL Nebulization QID  . levofloxacin (LEVAQUIN) IV  750 mg Intravenous Q48H  . multivitamin with minerals  1 tablet Oral Daily  . nebivolol  20 mg Oral Daily  . pantoprazole  40 mg Oral Daily  . [START ON 05/26/2014] predniSONE  60 mg Oral Q breakfast  . rivaroxaban  15 mg Oral Q supper  . sodium chloride  3 mL Intravenous Q12H   Continuous Infusions:   Active Problems:   Hypertension   Paroxysmal atrial fibrillation   CKD (chronic kidney disease) stage 3, GFR 30-59 ml/min   Chronic diastolic heart failure   COPD exacerbation   COPD (chronic obstructive pulmonary disease)   Acute respiratory failure with hypoxia   Chronic  obstructive pulmonary disease with acute exacerbation    Time spent: 30 min    Montavious Wierzba, [facility] Pager [phone]. If 7PM-7AM, please contact night-coverage at [email or web address], password [facility] 05/25/2014, 6:08 PM  LOS: 2 days

## 2014-05-25 NOTE — Clinical Social Work Note (Signed)
Clinical Social Work Assessment  Patient Details  Name: Jordan Byrd MRN: [id number] Date of Birth: [birth year]  Date of referral:  05/25/14               Reason for consult:  Other (Comment Required) (COPD Protocol)                  Housing/Transportation Living arrangements for the past 2 months:  [place] of Information:  Patient Patient Interpreter Needed:  None Criminal Activity/Legal Involvement Pertinent to Current Situation/Hospitalization:  No - Comment as needed Significant Relationships:  Adult Children Lives with:  Self Do you feel safe going back to the place where you live?  Yes Need for family participation in patient care:  No (Coment)  Care giving concerns:  None noted. Has large supportive family.  Social Worker assessment / plan:  Patient will return home with possible home health needs. She states that her family is very involved and supportive.  No further CSW needs identified and will sign off. Will be available if needed in the future.    Employment status:  Retired Nurse, adult PT Recommendations:  Not assessed at this time Information / Referral to community resources:  Other (Comment Required) (May benefit from [place] at d/c)  Patient/Family's Response to care:  Patient was very welcoming to [place] - feels that she will be fine at home and denies any needs.  Patient/Family's Understanding of and Emotional Response to Diagnosis, Current Treatment, and Prognosis:  Patient was noted to be very relaxed and happy during today's visit. Stated she is feeling better and hopes to return hom soon. She verbalizes a strong understanding of her COPD diagnosis and treatment options.   Emotional Assessment Appearance:  Appears stated age Attitude/Demeanor/Rapport:    Affect (typically observed):  Accepting, Happy, Calm Orientation:  Oriented to Self Alcohol / Substance use:  Never Used Psych involvement (Current  and /or in the community):  No (Comment)  Discharge Needs  Concerns to be addressed:  No discharge needs identified Readmission within the last 30 days:  Yes Current discharge risk:  None Barriers to Discharge:  Continued Medical Work up   [name] 05/25/2014, 4:28 PM  [phone]

## 2014-05-25 NOTE — Progress Notes (Addendum)
INITIAL NUTRITION ASSESSMENT  DOCUMENTATION CODES Per approved criteria  -Morbid Obesity   INTERVENTION: Ensure Enlive po once daily, each supplement provides 350 kcal and 20 grams of protein Provide Multivitamin with minerals daily Encourage intake of protein-rich foods   NUTRITION DIAGNOSIS: Inadequate protein intake related to dietary preferences as evidenced by pt's report/dietary recall.   Goal: Pt to meet >/= 90% of their estimated nutrition needs   Monitor:  PO intake, weight trend, labs  Reason for Assessment: Consult for Assessment of Nutrition Statues/Requirements  81 y.o. female  Admitting Dx: <principal problem not specified>  ASSESSMENT: 81 y.o. female with history of COPD, prior smoker, hypertension, proxysimal atrial fibrillation on flecainide/nebivolol/xarelto, diastolic heart failure, history of breast cancer status post lumpectomy 2015 (no chemo or XRT), was sent from cardiology's office due to concerning of COPD exacerbation.   Pt reports that she has a good appetite and eating 100% most of meals but, doesn't always eat the meat as she doesn't care for it much. She reports eating well PTA; reports feeling weak/fatigues often. Assessed pt's intake of protein-rich foods by dietary recall. Pt reports following a NAS (No Added Salt) diet and buys low sodium foods, she loves fruit, eats some vegetables and eggs; dislikes beans. RD emphasized the importance of getting adequate protein daily. Discussed protein-rich foods and encouraged intake; encouraged intake of fruits and vegetables as well.   Labs: elevated BUN, decreased GFR, elevated hgbA1C  Nutrition Focused Physical Exam:  Subcutaneous Fat:  Orbital Region: mild wasting Upper Arm Region: mild wasting Thoracic and Lumbar Region: NA  Muscle:  Temple Region: wnl Clavicle Bone Region: wnl Clavicle and Acromion Bone Region: wnl Scapular Bone Region: NA Dorsal Hand: wnl Patellar Region: wnl Anterior Thigh  Region: wnl Posterior Calf Region: wnl  Edema: +1 RLE and LLE edema  Height: Ht Readings from Last 1 Encounters:  05/23/14 5\' 6"  (1.676 m)    Weight: Wt Readings from Last 1 Encounters:  05/25/14 252 lb 14.4 oz (114.715 kg)    Ideal Body Weight: 130 lbs  % Ideal Body Weight: 194%  Wt Readings from Last 10 Encounters:  05/25/14 252 lb 14.4 oz (114.715 kg)  05/23/14 244 lb 6.4 oz (110.859 kg)  05/15/14 256 lb 6.3 oz (116.3 kg)  05/10/14 258 lb (117.028 kg)  04/04/14 259 lb (117.482 kg)  03/07/14 226 lb (102.513 kg)  10/24/13 246 lb 6.4 oz (111.766 kg)  10/06/13 247 lb 1.6 oz (112.084 kg)  07/29/13 243 lb 9.6 oz (110.496 kg)  07/28/13 246 lb 9.6 oz (111.857 kg)    Usual Body Weight: 240-260 lbs (fluctuates per pt due to fluid)  % Usual Body Weight: 100%  BMI:  Body mass index is 40.84 kg/(m^2).  Estimated Nutritional Needs: Kcal: 2000-2200 Protein: 110-120 grams Fluid: 2-2.2 L/day  Skin: intact  Diet Order:    EDUCATION NEEDS: -No education needs identified at this time   Intake/Output Summary (Last 24 hours) at 05/25/14 1552 Last data filed at 05/25/14 1337  Gross per 24 hour  Intake   1963 ml  Output   1250 ml  Net    713 ml    Last BM: 4/20   Labs:   Recent Labs Lab 05/23/14 1115 05/24/14 0413  NA 140 138  K 4.2 4.6  CL 100 101  CO2 30 21  BUN 37* 42*  CREATININE 1.45* 1.65*  CALCIUM 9.7 8.9  GLUCOSE 130* 137*    CBG (last 3)  No results for input(s): GLUCAP in  the last 72 hours.  Scheduled Meds: . amLODipine  10 mg Oral Daily  . antiseptic oral rinse  7 mL Mouth Rinse BID  . arformoterol  15 mcg Nebulization BID  . budesonide (PULMICORT) nebulizer solution  0.5 mg Nebulization BID  . flecainide  100 mg Oral BID  . furosemide  20 mg Oral Daily  . ipratropium-albuterol  3 mL Nebulization QID  . levofloxacin (LEVAQUIN) IV  750 mg Intravenous Q48H  . nebivolol  20 mg Oral Daily  . pantoprazole  40 mg Oral Daily  . [START ON  05/26/2014] predniSONE  60 mg Oral Q breakfast  . rivaroxaban  15 mg Oral Q supper  . sodium chloride  3 mL Intravenous Q12H    Continuous Infusions:   Past Medical History  Diagnosis Date  . Asthma   . Arthritis   . Hypertension   . Atrial fibrillation     a. 04/04/11  . Osteoarthritis     a. 03/28/11 - R Total Hip Arthroplasty  . Cataracts, both eyes   . Pneumonia   . Bronchitis   . Pleurisy   . Ankle swelling   . Kidney infection   . Frequent urination   . Excessive urination at night   . Weight gain   . CHF (congestive heart failure)   . Shortness of breath     doe-  . Anemia   . Anxiety     pt denies this  . GERD (gastroesophageal reflux disease)     [name] Prilosec  . Chronic headaches     no longer having headaches  . Macular degeneration   . Breast cancer 05/19/13    left breast stage IIA     Past Surgical History  Procedure Laterality Date  . Cardiac catheterization      1980's or 90's - reportedly normal  . Abdominal hysterectomy  1962  . Appendectomy  1954  . Cholecystectomy    . Back surgery  1990  . Cataract extraction bilateral w/ anterior vitrectomy  2007/2008  . Total hip arthroplasty  03/28/2011    Procedure: TOTAL HIP ARTHROPLASTY;  Surgeon: Yvette Rack., MD;  Location: [place];  Service: Orthopedics;  Laterality: Right;  . Cardioversion  04/05/2011    Procedure: CARDIOVERSION;  Surgeon: Coralyn Mark, MD;  Location: [place];  Service: Cardiovascular;  Laterality: N/A;  . Tonsillectomy  1943  . Adenoidectomy  1949  . Foot surgery  1947  . Thoracic disc surgery  2008  . Lumbar disc surgery  2010  . Knee surgery       both knees  . Shoulder surgery      left  . Total hip arthroplasty  03/28/2011    right  . Cardioversion N/A 06/19/2012    Procedure: CARDIOVERSION-bedside(3W36);  Surgeon: Lelon Perla, MD;  Location: [place];  Service: Cardiovascular;  Laterality: N/A;  . Colonoscopy    . Breast lumpectomy with needle localization Left  07/14/2013    Procedure: BREAST LUMPECTOMY WITH NEEDLE LOCALIZATION;  Surgeon: Stark Klein, MD;  Location: [place];  Service: General;  Laterality: Left;    Pryor Ochoa RD, LDN Inpatient Clinical Dietitian Pager: [phone] After Hours Pager: [phone]

## 2014-05-25 NOTE — Progress Notes (Signed)
OT Cancellation Note  Patient Details Name: Jordan Byrd MRN: [id number] DOB: 1933-04-19   Cancelled Treatment:    Reason Eval/Treat Not Completed: OT screened, no needs identified, will sign off. Pt reports she is at baseline and has no OT needs. Per PT is at Mod I level for mobility. Acute OT to sign off.   Villa Herb M 05/25/2014, 11:34 AM

## 2014-05-25 NOTE — Evaluation (Signed)
Physical Therapy Evaluation/ Discharge Patient Details Name: Jordan Byrd MRN: [id number] DOB: 21-Jun-1933 Today's Date: 05/25/2014   History of Present Illness  64 syo female with COPD, prior smoker, hypertension, proxysimal atrial fibrillation on flecainide/nebivolol/xarelto, diastolic heart failure, history of breast cancer status post lumpectomy 2015, was sent from cardiology's office due to concerning of COPD exacerbation  Clinical Impression  Pt pleasant and moving well but reports difficulty standing at home for long periods for cooking, housework and other activities. Family will get meals if she is unable to prepare for the day and pt states she cannot walk any further at baseline due to back, knee and foot pain. Pt educated for pursed lip breathing, encouraged to sit for bathing but pt states BSC won't fit and denies bench with cool water, encouraged walking program to increase function and activity. Pt at baseline functional level with all education completed. Pt maintained sats 90-94% on RA with gait with brief drop to 89% but no lower and increased with standing rest. Pt agreeable to no further needs, recommend RW use at all times and continued gait with staff.     Follow Up Recommendations No PT follow up    Equipment Recommendations  None recommended by PT    Recommendations for Other Services       Precautions / Restrictions Precautions Precautions: Fall Restrictions Weight Bearing Restrictions: No      Mobility  Bed Mobility Overal bed mobility: Modified Independent                Transfers Overall transfer level: Modified independent                  Ambulation/Gait Ambulation/Gait assistance: Modified independent (Device/Increase time) Ambulation Distance (Feet): 200 Feet Assistive device: Rolling walker (2 wheeled) Gait Pattern/deviations: Step-through pattern;Decreased stride length;Trunk flexed   Gait velocity interpretation: Below normal  speed for age/gender General Gait Details: standing rest half way due to wheezing sats 90% on RA, cues for posture and position in RW  Stairs            Wheelchair Mobility    Modified Rankin (Stroke Patients Only)       Balance Overall balance assessment: History of Falls;Needs assistance   Sitting balance-Leahy Scale: Good       Standing balance-Leahy Scale: Poor                               Pertinent Vitals/Pain Pain Assessment: No/denies pain    Home Living Family/patient expects to be discharged to:: Private residence Living Arrangements: Alone Available Help at Discharge: Family;Available PRN/intermittently Type of Home: House Home Access: Ramped entrance     Home Layout: One level Home Equipment: Walker - 2 wheels;Walker - 4 wheels;Cane - single point;Bedside commode Additional Comments: RW at times, furniture at times    Prior Function Level of Independence: Independent with assistive device(s);Needs assistance   Gait / Transfers Assistance Needed: performs gait and transfers on her own and at times with RW  ADL's / Homemaking Assistance Needed: bathes in shower. Family does the shopping and grandson drives her for appointments. Housekeeper once a month        Hand Dominance        Extremity/Trunk Assessment   Upper Extremity Assessment: Overall WFL for tasks assessed           Lower Extremity Assessment: Generalized weakness      Cervical / Trunk Assessment:  Kyphotic  Communication   Communication: No difficulties  Cognition Arousal/Alertness: Awake/alert Behavior During Therapy: WFL for tasks assessed/performed Overall Cognitive Status: Within Functional Limits for tasks assessed                      General Comments      Exercises        Assessment/Plan    PT Assessment [place] does not need any further PT services  PT Diagnosis Difficulty walking;Generalized weakness   PT Problem List    PT  Treatment Interventions     PT Goals (Current goals can be found in the Care Plan section) Acute Rehab PT Goals PT Goal Formulation: All assessment and education complete, DC therapy    Frequency     Barriers to discharge        Co-evaluation               End of Session   Activity Tolerance: Patient tolerated treatment well Patient left: in chair;with call bell/phone within reach Nurse Communication: Mobility status         Time: 4098-1191 PT Time Calculation (min) (ACUTE ONLY): 26 min   Charges:   PT Evaluation $Initial PT Evaluation Tier I: 1 Procedure PT Treatments $Therapeutic Activity: 8-22 mins   PT G CodesMelford Aase 05/25/2014, 8:49 AM Elwyn Reach, [place]

## 2014-05-26 LAB — BASIC METABOLIC PANEL
Anion gap: 12 (ref 5–15)
BUN: 48 mg/dL — ABNORMAL HIGH (ref 6–23)
CO2: 30 mmol/L (ref 19–32)
CREATININE: 1.58 mg/dL — AB (ref 0.50–1.10)
Calcium: 8.9 mg/dL (ref 8.4–10.5)
Chloride: 96 mmol/L (ref 96–112)
GFR calc Af Amer: 35 mL/min — ABNORMAL LOW (ref >90)
GFR, EST NON AFRICAN AMERICAN: 30 mL/min — AB (ref >90)
Glucose, Bld: 104 mg/dL — ABNORMAL HIGH (ref 70–99)
Potassium: 4.2 mmol/L (ref 3.5–5.1)
Sodium: 138 mmol/L (ref 135–145)

## 2014-05-26 MED ORDER — BENZONATATE 100 MG PO CAPS
100.0000 mg | ORAL_CAPSULE | Freq: Three times a day (TID) | ORAL | Status: DC | PRN
  Administered 2014-05-26: 100 mg via ORAL
  Filled 2014-05-26 (×3): qty 1

## 2014-05-26 NOTE — Progress Notes (Signed)
[place] for levaquin Indication: pneumonia  Allergies  Allergen Reactions  . Methocarbamol Hives  . Vicodin [Hydrocodone-Acetaminophen] Other (See Comments)    Interacts with bp medication and drops blood pressure  . Aspirin Nausea And Vomiting  . Butazolidin [Phenylbutazone] Other (See Comments)    unknown  . Celebrex [Celecoxib] Nausea And Vomiting    Nausea and vomiting  . Codeine Nausea And Vomiting  . Doripenem Other (See Comments)    unknown  . Aspirin Buf(Alhyd-Mghyd-Cacar) Nausea And Vomiting  . Mucinex [Guaifenesin Er] Itching  . Temazepam Other (See Comments)    unknown    Patient Measurements: Height: 5\' 6"  (167.6 cm) Weight: 250 lb (113.399 kg) IBW/kg (Calculated) : 59.3  Vital Signs: Temp: 97.5 F (36.4 C) (04/22 0533) Temp Source: Oral (04/22 0533) BP: 154/59 mmHg (04/22 0533) Pulse Rate: 69 (04/22 0533) Intake/Output from previous day: 04/21 0701 - 04/22 0700 In: 2460 [P.O.:2460] Out: 3650 [Urine:3650] Intake/Output from this shift: Total I/O In: 120 [P.O.:120] Out: -   Labs:  Recent Labs  05/23/14 1115 05/24/14 0413 05/26/14 0610  WBC 10.8* 12.6*  --   HGB 12.3 12.2  --   PLT 283 272  --   CREATININE 1.45* 1.65* 1.58*   Estimated Creatinine Clearance: 37.5 mL/min (by C-G formula based on Cr of 1.58). No results for input(s): VANCOTROUGH, VANCOPEAK, VANCORANDOM, GENTTROUGH, GENTPEAK, GENTRANDOM, TOBRATROUGH, TOBRAPEAK, TOBRARND, AMIKACINPEAK, AMIKACINTROU, AMIKACIN in the last 72 hours.   Microbiology: No results found for this or any previous visit (from the past 720 hour(s)).  Medical History: Past Medical History  Diagnosis Date  . Asthma   . Arthritis   . Hypertension   . Atrial fibrillation     a. 04/04/11  . Osteoarthritis     a. 03/28/11 - R Total Hip Arthroplasty  . Cataracts, both eyes   . Pneumonia   . Bronchitis   . Pleurisy   . Ankle swelling   . Kidney infection   . Frequent  urination   . Excessive urination at night   . Weight gain   . CHF (congestive heart failure)   . Shortness of breath     doe-  . Anemia   . Anxiety     pt denies this  . GERD (gastroesophageal reflux disease)     [name] Prilosec  . Chronic headaches     no longer having headaches  . Macular degeneration   . Breast cancer 05/19/13    left breast stage IIA     Medications:  Anti-infectives    Start     Dose/Rate Route Frequency Ordered Stop   05/24/14 1700  vancomycin (VANCOCIN) 1,250 mg in sodium chloride 0.9 % 250 mL IVPB  Status:  Discontinued     1,250 mg 166.7 mL/hr over 90 Minutes Intravenous Every 24 hours 05/23/14 1622 05/24/14 1430   05/23/14 1700  levofloxacin (LEVAQUIN) IVPB 750 mg  Status:  Discontinued     750 mg 100 mL/hr over 90 Minutes Intravenous Every 24 hours 05/23/14 1622 05/23/14 1643   05/23/14 1700  levofloxacin (LEVAQUIN) IVPB 750 mg     750 mg 100 mL/hr over 90 Minutes Intravenous Every 48 hours 05/23/14 1643     05/23/14 1630  vancomycin (VANCOCIN) 2,000 mg in sodium chloride 0.9 % 500 mL IVPB     2,000 mg 250 mL/hr over 120 Minutes Intravenous  Once 05/23/14 1622 05/23/14 1842     Assessment: 72 yoF with COPD and CKD  presents via EMS from MD office with worsening SOB and wheezing since her recent discharge for COPD exacerbation. Pharmacy consulted to dose Levaquin  for HCAP. Afebrile, WBC 12.6, CrCl ~35-40 ml/min  4/19 sputum cx >>  Goal of Therapy:  Eradication of infection  Plan:  Cont Levaquin 750 mg IV q48h Monitor C&S, renal function, clinical progress  Jennye Runquist [place] 05/26/2014,10:28 AM

## 2014-05-26 NOTE — Progress Notes (Signed)
[facility] PROGRESS NOTE  Jordan Byrd NWG:[id number] DOB: 02-21-1933 DOA: 05/23/2014 PCP: [name], MD  Brief Summary  Jordan Byrd is a 81 y.o. female with history of COPD, prior smoker, hypertension, proxysimal atrial fibrillation on flecainide/nebivolol/xarelto, diastolic heart failure, history of breast cancer status post lumpectomy 2015 (no chemo or XRT), was sent from cardiology's office due to concerning of COPD exacerbation. Patient was recently treated for flu and copd exacerbation and was discharged home but became more sob.  She was seen by pmd and started back on steroids but while at her cardiology appointment, she was wheezing and sent by EMS to the ER for evaluation.  Patient reported nonproductive cough, no fever, no chest pain, no edema, baseline limited mobility due to chronic DOE, reported has been on and off home oxygen in the past.  She was admitted for COPD exacerbation.    Assessment/Plan  Acute hypoxic respiratory failure secondary to acute COPD exacerbation, still Jordan Byrd of breath at rest -   Continue oral prednisone  -   Continue nebulizer treatments -   Budesonide and Brovana -   Resumed PPI -   Continue antibiotics -  Pulmonologist will follow up as outpatient on 5/5 -   Wean oxygen as tolerated -   Continuous pulse oximetry overnight:  Qualifies for 2L  nightly   paroxysmal atrial fibrillation, in normal sinus rhythm in the emergency department -   chads 2 vascular 3, patient should be on anticoagulation -   Continued Xarelto -   Continued flecainide and nebivolol   Hypertension, blood pressure mildly elevated, likely secondary to steroids -   Continued Norvasc and beta blocker -   Resumed ARB  Chronic diastolic heart failure, now with some rales and 1+ LEE -   -1.2L, 1kg decrease over last 24 hours -   Continue Lasix 40 mg IV twice a day -   Creatinine stable  H/o breast cancer (left) s/p lumpectomy (07/2013), per chart review,  cancer was ER/PR positive, but patient declined adjuvant radiation, decline aromatase inhibitor.   mild leukocytosis likely secondary to steroids, repeat as outaptient   Chronic kidney disease stage III, creatinine near baseline of 1.4-1.6 -   Renally dose medications and minimize nephrotoxins  Diet:   Low sodium Access:   PIV IVF:   off Proph:   Xarelto  Code Status:  full Family Communication:  Patient alone Disposition Plan:  Pending improvement in breathing, PT/OT   Consultants:   none  Procedures:   chest x-ray  Antibiotics:   vancomycin 4/19 -4/20   Levofloxacin 4/19 >>  HPI/Subjective:  Did not sleep well last night because of shortness of breath. She continues to have significant dyspnea on exertion. Her oxygen levels dropped to the low 80s on room air while walking to the sink. She has severe cough which is not productive but sounds like she has junk in her chest.  Objective: Filed Vitals:   05/26/14 1030 05/26/14 1145 05/26/14 1417 05/26/14 1521  BP: 158/65  118/49   Pulse: 79  72   Temp:   98 F (36.7 C)   TempSrc:   Oral   Resp:      Height:      Weight:      SpO2:  90% 94% 93%    Intake/Output Summary (Last 24 hours) at 05/26/14 1608 Last data filed at 05/26/14 1400  Gross per 24 hour  Intake   1420 ml  Output   3350 ml  Net  -1930 ml   Filed Weights   05/24/14 0630 05/25/14 0535 05/26/14 0533  Weight: 115.35 kg (254 lb 4.8 oz) 114.715 kg (252 lb 14.4 oz) 113.399 kg (250 lb)    Exam:   General:    Obese female, able to speak in complete sentences   HEENT:  NCAT, MMM  Cardiovascular:  RRR, nl S1, S2 no mrg, 2+ pulses, warm extremities  Respiratory:   Diminished bilateral breath sounds, full expiratory wheeze with prolonged expiratory phase, rales at bilateral bases, no rhonchi  Abdomen:   NABS, soft, NT/ND  MSK:   Normal tone and bulk, 1+ LEE  Neuro:  Grossly intact  Data Reviewed: Basic Metabolic Panel:  Recent Labs Lab  05/23/14 1115 05/24/14 0413 05/26/14 0610  NA 140 138 138  K 4.2 4.6 4.2  CL 100 101 96  CO2 30 21 30   GLUCOSE 130* 137* 104*  BUN 37* 42* 48*  CREATININE 1.45* 1.65* 1.58*  CALCIUM 9.7 8.9 8.9   Liver Function Tests:  Recent Labs Lab 05/23/14 1115  AST 23  ALT 25  ALKPHOS 66  BILITOT 0.8  PROT 6.0  ALBUMIN 3.8   No results for input(s): LIPASE, AMYLASE in the last 168 hours. No results for input(s): AMMONIA in the last 168 hours. CBC:  Recent Labs Lab 05/23/14 1115 05/24/14 0413  WBC 10.8* 12.6*  NEUTROABS 9.2*  --   HGB 12.3 12.2  HCT 39.1 37.2  MCV 94.9 93.7  PLT 283 272   Cardiac Enzymes:  Recent Labs Lab 05/24/14 1135 05/24/14 1545  TROPONINI <0.03 <0.03   BNP (last 3 results)  Recent Labs  03/11/14 2233 05/13/14 0955 05/23/14 1252  BNP 94.7 168.6* 40.0    ProBNP (last 3 results) No results for input(s): PROBNP in the last 8760 hours.  CBG: No results for input(s): GLUCAP in the last 168 hours.  No results found for this or any previous visit (from the past 240 hour(s)).   Studies: No results found.  Scheduled Meds: . amLODipine  10 mg Oral Daily  . antiseptic oral rinse  7 mL Mouth Rinse BID  . arformoterol  15 mcg Nebulization BID  . budesonide (PULMICORT) nebulizer solution  0.5 mg Nebulization BID  . feeding supplement (ENSURE ENLIVE)  237 mL Oral Q24H  . flecainide  100 mg Oral BID  . furosemide  40 mg Intravenous BID  . ipratropium-albuterol  3 mL Nebulization QID  . levofloxacin (LEVAQUIN) IV  750 mg Intravenous Q48H  . losartan  100 mg Oral Daily  . multivitamin with minerals  1 tablet Oral Daily  . nebivolol  20 mg Oral Daily  . pantoprazole  40 mg Oral Daily  . predniSONE  60 mg Oral Q breakfast  . rivaroxaban  15 mg Oral Q supper  . sodium chloride  3 mL Intravenous Q12H   Continuous Infusions:   Active Problems:   Hypertension   Paroxysmal atrial fibrillation   CKD (chronic kidney disease) stage 3, GFR 30-59  ml/min   Chronic diastolic heart failure   COPD exacerbation   COPD (chronic obstructive pulmonary disease)   Acute respiratory failure with hypoxia   Chronic obstructive pulmonary disease with acute exacerbation    Time spent: 30 min    Erin Uecker, [place] [facility] Pager [phone]. If 7PM-7AM, please contact night-coverage at [email or web address], password [facility] 05/26/2014, 4:08 PM  LOS: 3 days

## 2014-05-26 NOTE — Progress Notes (Signed)
Pulse oximetry was ordered but has come late tonight as of now. Applied continuous pulse oximetry to patient and will monitor patient through out the night. Will continue to monitor patient to end of shift.

## 2014-05-26 NOTE — Progress Notes (Signed)
Patient's oxygen saturations drops when going to the bathroom to the mid 80's without any oxygen but once she goes back to bed, oxygen saturations rise up to 94%. Will continue to monitor patient to end of shift.

## 2014-05-26 NOTE — Progress Notes (Signed)
Please note for not noted in last progress note, patient's oxygen saturations drop when she is sleeping into the mid 80's. The lowest it went down to was 84%. Pt was awaken and then oxygen saturations went up to the 90's. Patient may benefit from wearing oxygen at night while sleeping to prevent further issues. Nurse signing off at this time. Report given to day nurse.

## 2014-05-27 DIAGNOSIS — I5033 Acute on chronic diastolic (congestive) heart failure: Principal | ICD-10-CM

## 2014-05-27 LAB — BASIC METABOLIC PANEL
ANION GAP: 13 (ref 5–15)
BUN: 57 mg/dL — AB (ref 6–23)
CALCIUM: 8.3 mg/dL — AB (ref 8.4–10.5)
CO2: 29 mmol/L (ref 19–32)
Chloride: 97 mmol/L (ref 96–112)
Creatinine, Ser: 1.59 mg/dL — ABNORMAL HIGH (ref 0.50–1.10)
GFR calc Af Amer: 35 mL/min — ABNORMAL LOW (ref >90)
GFR calc non Af Amer: 30 mL/min — ABNORMAL LOW (ref >90)
GLUCOSE: 82 mg/dL (ref 70–99)
POTASSIUM: 3.9 mmol/L (ref 3.5–5.1)
Sodium: 139 mmol/L (ref 135–145)

## 2014-05-27 MED ORDER — FUROSEMIDE 40 MG PO TABS: 40.0000 mg | ORAL_TABLET | Freq: Every day | ORAL | Status: DC

## 2014-05-27 MED ORDER — POTASSIUM CHLORIDE CRYS ER 10 MEQ PO TBCR: 10.0000 meq | EXTENDED_RELEASE_TABLET | Freq: Every day | ORAL | Status: DC

## 2014-05-27 MED ORDER — PREDNISONE 20 MG PO TABS: ORAL_TABLET | ORAL | Status: DC

## 2014-05-27 MED ORDER — LEVOFLOXACIN 750 MG PO TABS
750.0000 mg | ORAL_TABLET | Freq: Once | ORAL | Status: AC
  Administered 2014-05-27: 750 mg via ORAL
  Filled 2014-05-27: qty 1

## 2014-05-27 NOTE — Progress Notes (Signed)
Patient alert and oriented, denies pain, v/s stable, ambulate patient in hall way  See oxygen saturation qualification for home oxygen. Iv d/c, d/c information given, patient verbalize understanding. Patient d/c home with oxygen

## 2014-05-27 NOTE — Progress Notes (Signed)
SATURATION QUALIFICATIONS: (This note is used to comply with regulatory documentation for home oxygen)  Patient Saturations on Room Air at Rest =92%  Patient Saturations on Room Air while Ambulating = 88%  Patient Saturations on2 Liters of oxygen while Ambulating =92%  Please briefly explain why patient needs home oxygen: Ambulate patient in hall way, oxygen saturation 88% without O2, with shortness of breath, 92% on 2L oxygen and shortness of breath.

## 2014-05-27 NOTE — Progress Notes (Signed)
ANTICOAGULATION CONSULT NOTE - FOLLOW UP  Pharmacy Consult:  Xarelto Indication: atrial fibrillation  Allergies  Allergen Reactions  . Methocarbamol Hives  . Vicodin [Hydrocodone-Acetaminophen] Other (See Comments)    Interacts with bp medication and drops blood pressure  . Aspirin Nausea And Vomiting  . Butazolidin [Phenylbutazone] Other (See Comments)    unknown  . Celebrex [Celecoxib] Nausea And Vomiting    Nausea and vomiting  . Codeine Nausea And Vomiting  . Doripenem Other (See Comments)    unknown  . Aspirin Buf(Alhyd-Mghyd-Cacar) Nausea And Vomiting  . Mucinex [Guaifenesin Er] Itching  . Temazepam Other (See Comments)    unknown    Patient Measurements: Height: 5\' 6"  (167.6 cm) Weight: 249 lb (112.946 kg) (Scale B) IBW/kg (Calculated) : 59.3  Vital Signs: Temp: 97.5 F (36.4 C) (04/23 1014) Temp Source: Oral (04/23 1014) BP: 126/53 mmHg (04/23 1014) Pulse Rate: 65 (04/23 0558)  Labs:  Recent Labs  05/24/14 1135 05/24/14 1545 05/26/14 0610 05/27/14 0540  CREATININE  --   --  1.58* 1.59*  TROPONINI <0.03 <0.03  --   --     Estimated Creatinine Clearance: 37.1 mL/min (by C-G formula based on Cr of 1.59).        Assessment: 27 YOF with history of Afib to continue on Xarelto.  Patient has CKD and calculated CrCL remains < 50 ml/min.  Her blood count is stable and WNL; no bleeding reported.   Goal of Therapy:  Adequate anticoagulation Monitor platelets by anticoagulation protocol: Yes  Resolution of infection    Plan:  - Continue Xarelto 15mg  PO daily with supper - Continue LVQ 750mg  IV Q48H.  Consider establishing stop date for abx, today is D#4. - Pharmacy will sign off and follow peripherally.  Thank you for the consults!    Elasia Furnish D. Mina Marble, PharmD, BCPS Pager:  [phone] 05/27/2014, 10:48 AM

## 2014-05-27 NOTE — Discharge Summary (Signed)
Physician Discharge Summary  Jordan Byrd [name]:[id number] DOB: 12/10/1933 DOA: 05/23/2014  PCP: [name], MD  Admit date: 05/23/2014 Discharge date: 05/27/2014  Recommendations for Outpatient Follow-up:  1. PCP in 1 week to repeat BMP for potassium and creatinine and CBC 2. F/u with pulmonology at already scheduled appointment in 1-2 weeks  Discharge Diagnoses:  Principal Problem:   Acute on chronic diastolic heart failure Active Problems:   Hypertension   Paroxysmal atrial fibrillation   CKD (chronic kidney disease) stage 3, GFR 30-59 ml/min   COPD exacerbation   Acute respiratory failure with hypoxia   Chronic obstructive pulmonary disease with acute exacerbation   Discharge Condition: stable, improved  Diet recommendation: low sodium  Wt Readings from Last 3 Encounters:  05/27/14 112.946 kg (249 lb)  05/23/14 110.859 kg (244 lb 6.4 oz)  05/15/14 116.3 kg (256 lb 6.3 oz)    History of present illness:   Jordan Byrd is a 81 y.o. female with history of COPD, prior smoker, hypertension, proxysimal atrial fibrillation on flecainide/nebivolol/xarelto, diastolic heart failure, history of breast cancer status post lumpectomy 2015 (no chemo or XRT), was sent from cardiology's office due to concerning of COPD exacerbation. Patient was recently treated for flu and copd exacerbation and was discharged home but became more sob. She was seen by pmd and started back on steroids but while at her cardiology appointment, she was wheezing and sent by EMS to the ER for evaluation. Patient reported nonproductive cough, no fever, no chest pain, baseline edema, baseline limited mobility due to chronic DOE, reported has been on and off home oxygen in the past. She was admitted for COPD exacerbation. She was started on IV solumedrol and duonebs and had slow improvement.  She developed increased lower extremity edema and was diuresed with lasix 40mg  IV BID which improved her dyspnea.  She  will be discharged with home oxygen with follow up with pulmonology.    Hospital Course:   Acute hypoxic respiratory failure secondary to acute COPD exacerbation and acute on chronic diastolic heart failure - Continue oral prednisone  - Continue nebulizer treatments - Budesonide and Brovana - continued PPI - she completed 5 days of antibiotics in hospital -  Pulmonologist will follow up as outpatient on 5/5 - Qualifies for 2L [place] nightly  Paroxysmal atrial fibrillation, in normal sinus rhythm in the emergency department - chads 2 vascular 3, patient should be on anticoagulation - Continued Xarelto - Continued flecainide and nebivolol  Hypertension, blood pressure mildly elevated, likely secondary to steroids - Continued Norvasc and beta blocker - Resumed ARB  Acute on chronic diastolic heart failure, with rales and 1+ LEE which improved with diuresis - weight trended down to 249-lbs with some mild persistent edema  H/o breast cancer (left) s/p lumpectomy (07/2013), per chart review, cancer was ER/PR positive, but patient declined adjuvant radiation, decline aromatase inhibitor.  Mild leukocytosis likely secondary to steroids, repeat as outaptient  Chronic kidney disease stage III, creatinine remained near baseline of 1.4-1.6  Consultants:  none  Procedures:  chest x-ray  Antibiotics:  vancomycin 4/19 -4/20   Levofloxacin 4/19 >> 4/23  Discharge Exam: Filed Vitals:   05/27/14 1445  BP: 151/62  Pulse: 66  Temp: 97.3 F (36.3 C)  Resp: 20   Filed Vitals:   05/27/14 1014 05/27/14 1122 05/27/14 1132 05/27/14 1445  BP: 126/53  131/52 151/62  Pulse:   73 66  Temp: 97.5 F (36.4 C)  98.4 F (36.9 C) 97.3 F (  36.3 C)  TempSrc: Oral  Axillary Oral  Resp: 18  20 20   Height:      Weight:      SpO2:  95% 95% 94%     General: Obese female, able to speak in complete sentences   HEENT: NCAT, MMM  Cardiovascular: RRR, nl S1, S2  no mrg, 2+ pulses, warm extremities  Respiratory: Diminished bilateral breath sounds, end expiratory wheeze, no rales, no rhonchi  Abdomen: NABS, soft, NT/ND  MSK: Normal tone and bulk, 1+ LEE  Neuro: Grossly intact  Discharge Instructions      Discharge Instructions    (HEART FAILURE PATIENTS) Call MD:  Anytime you have any of the following symptoms: 1) 3 pound weight gain in 24 hours or 5 pounds in 1 week 2) shortness of breath, with or without a dry hacking cough 3) swelling in the hands, feet or stomach 4) if you have to sleep on extra pillows at night in order to breathe.    Complete by:  As directed      Call MD for:  difficulty breathing, headache or visual disturbances    Complete by:  As directed      Call MD for:  extreme fatigue    Complete by:  As directed      Call MD for:  hives    Complete by:  As directed      Call MD for:  persistant dizziness or light-headedness    Complete by:  As directed      Call MD for:  persistant nausea and vomiting    Complete by:  As directed      Call MD for:  severe uncontrolled pain    Complete by:  As directed      Call MD for:  temperature >100.4    Complete by:  As directed      Diet - low sodium heart healthy    Complete by:  As directed      Discharge instructions    Complete by:  As directed   You were hospitalized with difficulty breathing which was probably due to a combination of your COPD and your heart failure.  Since you have gotten much better after we started the IV lasix, I wonder if your symptoms were more from heart failure.  Please increase your lasix to 40mg  once a day and start taking a little daily potassium.  Weigh yourself every day and if you gain more than 3-lbs in one day or 5-lbs at all from the time of discharge, talk to your primary care doctor.  Please continue your symbicort and albuterol as before.  Take prednisone in a decreasing dose for the next 6 days.  Please have your primary care doctor  check your kidney function and potassium in 1 week.  You also have an appointment on May 5th with pulmonology.  If you develop worsening shortness of breath, please call 911 or seek immediate medical attention.     Increase activity slowly    Complete by:  As directed             Medication List    STOP taking these medications        chlorzoxazone 500 MG tablet  Commonly known as:  PARAFON     ICAPS PO      TAKE these medications        acetaminophen 500 MG tablet  Commonly known as:  TYLENOL  Take 1,000 mg by mouth 2 (two)  times daily.     albuterol (2.5 MG/3ML) 0.083% nebulizer solution  Commonly known as:  PROVENTIL  Take 3 mLs (2.5 mg total) by nebulization every 6 (six) hours as needed for wheezing or shortness of breath.     albuterol 108 (90 BASE) MCG/ACT inhaler  Commonly known as:  PROVENTIL HFA;VENTOLIN HFA  Inhale 1 puff into the lungs every 6 (six) hours as needed for wheezing.     amLODipine 10 MG tablet  Commonly known as:  NORVASC  Take 10 mg by mouth daily.     budesonide-formoterol 160-4.5 MCG/ACT inhaler  Commonly known as:  SYMBICORT  Inhale 2 puffs into the lungs 2 (two) times daily.     flecainide 100 MG tablet  Commonly known as:  TAMBOCOR  Take 1 tablet (100 mg total) by mouth every 12 (twelve) hours.     furosemide 40 MG tablet  Commonly known as:  LASIX  Take 1 tablet (40 mg total) by mouth daily.     IRON PO  Liquid Iron - Take 1 teaspoon a day in the mornings     losartan 100 MG tablet  Commonly known as:  COZAAR  Take 100 mg by mouth daily.     MULTIVITAMINS PO  Take 1 tablet by mouth daily.     nebivolol 10 MG tablet  Commonly known as:  BYSTOLIC  Take 20 mg by mouth daily.     omeprazole 20 MG capsule  Commonly known as:  PRILOSEC  Take 20 mg by mouth daily.     potassium chloride 10 MEQ tablet  Commonly known as:  K-DUR,KLOR-CON  Take 1 tablet (10 mEq total) by mouth daily.     predniSONE 20 MG tablet  Commonly  known as:  DELTASONE  Take 2 tabs one daily for 2 days, then 1 tab daily for 2 days, then 1/2 tab daily for 2 days, then stop.     rivaroxaban 20 MG Tabs tablet  Commonly known as:  XARELTO  Take 1 tablet (20 mg total) by mouth daily.       Follow-up Information    Follow up with GREEN, EDWIN JAY, MD. Schedule an appointment as soon as possible for a visit in 1 week.   Specialty:  Internal Medicine   Contact information:   [address] [place] [address] [phone]       Follow up with PARRETT,TAMMY, NP On 06/08/2014.   Specialty:  Nurse Practitioner   Why:  11:15AM   Contact information:   [address] [place] [address] [phone]        The results of significant diagnostics from this hospitalization (including imaging, microbiology, ancillary and laboratory) are listed below for reference.    Significant Diagnostic Studies: Dg Chest 2 View (if Patient Has Fever And/or Copd)  05/13/2014   CLINICAL DATA:  Wheezing, productive cough, shortness of breath. History of asthma and breast cancer.  EXAM: CHEST  2 VIEW  COMPARISON:  03/11/2014; 08/15/2013; 07/14/2013  FINDINGS: Grossly unchanged enlarged cardiac silhouette and mediastinal contours with atherosclerotic plaque within the thoracic aorta. The lungs appear mildly hyperexpanded with mild diffuse thickening of the pulmonary interstitium. There is persistent pleural parenchymal thickening about the bilateral major fissures. No pleural effusion or pneumothorax. No discrete focal airspace opacities. No evidence of edema. Unchanged bones including lower cervical ACDF, incompletely evaluated. Surgical clips overlying the left breast and right upper abdominal quadrant.  IMPRESSION: Cardiomegaly and lung hyperexpansion/bronchitic change without acute  cardiopulmonary disease.   Electronically Signed   By: Sandi Mariscal M.D.   On: 05/13/2014 10:57   Dg Chest Port 1 View  05/23/2014   CLINICAL DATA:  Jordan Byrd of  breath  EXAM: PORTABLE CHEST - 1 VIEW  COMPARISON:  05/13/2014  FINDINGS: The heart size and mediastinal contours are within normal limits. Both lungs are clear. The visualized skeletal structures are unremarkable.  IMPRESSION: No active disease.   Electronically Signed   By: Franchot Gallo M.D.   On: 05/23/2014 11:46    Microbiology: No results found for this or any previous visit (from the past 240 hour(s)).   Labs: Basic Metabolic Panel:  Recent Labs Lab 05/23/14 1115 05/24/14 0413 05/26/14 0610 05/27/14 0540  NA 140 138 138 139  K 4.2 4.6 4.2 3.9  CL 100 101 96 97  CO2 30 21 30 29   GLUCOSE 130* 137* 104* 82  BUN 37* 42* 48* 57*  CREATININE 1.45* 1.65* 1.58* 1.59*  CALCIUM 9.7 8.9 8.9 8.3*   Liver Function Tests:  Recent Labs Lab 05/23/14 1115  AST 23  ALT 25  ALKPHOS 66  BILITOT 0.8  PROT 6.0  ALBUMIN 3.8   No results for input(s): LIPASE, AMYLASE in the last 168 hours. No results for input(s): AMMONIA in the last 168 hours. CBC:  Recent Labs Lab 05/23/14 1115 05/24/14 0413  WBC 10.8* 12.6*  NEUTROABS 9.2*  --   HGB 12.3 12.2  HCT 39.1 37.2  MCV 94.9 93.7  PLT 283 272   Cardiac Enzymes:  Recent Labs Lab 05/24/14 1135 05/24/14 1545  TROPONINI <0.03 <0.03   BNP: BNP (last 3 results)  Recent Labs  03/11/14 2233 05/13/14 0955 05/23/14 1252  BNP 94.7 168.6* 40.0    ProBNP (last 3 results) No results for input(s): PROBNP in the last 8760 hours.  CBG: No results for input(s): GLUCAP in the last 168 hours.  Time coordinating discharge: 35 minutes  Signed:  [place]  [facility] 05/27/2014, 3:31 PM

## 2014-05-28 NOTE — Progress Notes (Signed)
CARE MANAGEMENT NOTE 05/28/2014  Patient:  Jordan Byrd, Jordan Byrd   Account Number:  [id number]  Date Initiated:  05/24/2014  Documentation initiated by:  AMERSON,JULIE  Subjective/Objective Assessment:   Pt adm on 05/23/14 with COPD exacerbation.  PTA, pt resides at home alone.     Action/Plan:   Will follow for dc planning as pt progresses.  May need home oxygen.   Anticipated DC Date:  05/26/2014   Anticipated DC Plan:  [place]  CM consult      Choice offered to / List presented to:     DME arranged  [facility]      DME agency  [facility].        Status of service:  Completed, signed off Medicare Important Message given?  YES (If response is "NO", the following Medicare IM given date fields will be blank) Date Medicare IM given:  05/26/2014 Medicare IM given by:  AMERSON,JULIE Date Additional Medicare IM given:   Additional Medicare IM given by:    Discharge Disposition:  HOME/SELF CARE  Per UR Regulation:  Reviewed for med. necessity/level of care/duration of stay  If discussed at [place] of Stay Meetings, dates discussed:    Comments:  05/27/14 17:00 CM received call from RN stting pt needs home O2.  [facility] DME rep, Jeneen Rinks to deliver to room prior to discharge.  No other CM needs were communicated.  Mariane Masters, BSN, [place].

## 2014-05-28 NOTE — Assessment & Plan Note (Signed)
Qualifies for oxygen during sleep but is not willing to consider it. Education effort continues.

## 2014-05-28 NOTE — Assessment & Plan Note (Signed)
Resolved. Educated on importance of continuing flu shots each fall.

## 2014-05-28 NOTE — Assessment & Plan Note (Signed)
Unwilling to consider sleep study

## 2014-06-08 ENCOUNTER — Inpatient Hospital Stay: Payer: PPO | Admitting: Adult Health

## 2014-06-14 ENCOUNTER — Encounter: Payer: Self-pay | Admitting: Adult Health

## 2014-06-14 ENCOUNTER — Ambulatory Visit (INDEPENDENT_AMBULATORY_CARE_PROVIDER_SITE_OTHER): Payer: PPO | Admitting: Adult Health

## 2014-06-14 VITALS — BP 116/64 | HR 64 | Temp 97.7°F | Ht 66.0 in | Wt 259.2 lb

## 2014-06-14 DIAGNOSIS — I5032 Chronic diastolic (congestive) heart failure: Secondary | ICD-10-CM | POA: Diagnosis not present

## 2014-06-14 DIAGNOSIS — J9611 Chronic respiratory failure with hypoxia: Secondary | ICD-10-CM

## 2014-06-14 DIAGNOSIS — J441 Chronic obstructive pulmonary disease with (acute) exacerbation: Secondary | ICD-10-CM | POA: Diagnosis not present

## 2014-06-14 NOTE — Progress Notes (Signed)
03/07/14- 42 yoF former smoker referred courtesy of Dr Rayann Heman for sleep medicine evaluation. Son here Complicated by hx breast CaL She says she sleeps "fine" except for nocturia every 2 hours. Widowed, not aware of snoring. Admits some daytime sleepiness. Smoked for 21 years, quitting at age 81. History of intermittent asthma without rhinitis. Had pneumonia last year. Sometimes wheezes but denies coughing. Using Symbicort 2 puffs, once daily and rare need for rescue inhaler.  Dyspnea on exertion has increased in the past year-walking one or 2 rooms, making bed. Weight increased.. Feet swell if she sits a lot. Cardiology follows for atrial fibrillation/Xarelto She had worked in a [place] with incidental dust. PFT 10/26/13- Moderately severe obstructive disease, no response, possible restriction, mild Diffusion reduction.FVC 2.18/ 75%, FEV1 1.30/ 59%, FEV1/FVC 0.60,  DLCO 71%, TLC 99% CXR 08/15/13- IMPRESSION: No acute cardiopulmonary disease. No explanation for left-sided chest pain. Electronically Signed  By: Abigail Miyamoto M.D.  On: 08/15/2013  05/10/14- 80 yoF former smoker followed for COPD evaluation. Son here SOB w/activity;chest tightness; wheezing; Complicated by hx L breast Ca, dCHF, PAF Overnight oximetry on 03/10/2014 qualified for oxygen at 2 L during sleep but she is reluctant. Says she used oxygen in the past and "couldn't keep it on". Continues Symbicort. Did not like Spiriva. Unwilling to consider a sleep study.   06/14/2014 post hospital follow-up Returns for a post hospital follow-up Patient was re- admitted April 19 through April 23 for acute on chronic diastolic heart failure exacerbation with acute respiratory failure and COPD exacerbation. She was treated with IV antibiotics, steroids, and nebulized bronchodilators. She did require aggressive diuresis. Discharged on a prednisone taper and antibiotics Started on oxygen with act and At bedtime   Remains on  Symbicort. Since discharge. Patient is feeling weak and run down . Has lingering cough and congestion . No fever, chest pain, orthopnea or increased edema.  On lasix 40mg  Twice daily  .  Prior to this admission. Patient had been in the hospital April 9 for COPD flare with influenza. Treated with antibiotics, steroids, and Tamiflu. She denies any hemoptysis, orthopnea, PND, or increased leg swelling.     ROS-see HPI Constitutional:   No-   weight loss, night sweats, fevers, chills, fatigue, lassitude. HEENT:   No-  headaches, difficulty swallowing, tooth/dental problems, sore throat,       No-  sneezing, itching, ear ache, nasal congestion, post nasal drip,  CV:  No-   chest pain, orthopnea, PND, swelling in lower extremities, anasarca,                                  dizziness,  Resp: + shortness of breath with exertion or at rest.              No-   productive cough,  No non-productive cough,  No- coughing up of blood.             No-   change in color of mucus.   Skin: No-   rash or lesions. GI:  No-   heartburn, indigestion, abdominal pain, nausea, vomiting, GU:  MS:  .  No- decreased range of motion.  No- back pain. Neuro-     nothing unusual Psych:  No- change in mood or affect. No depression or anxiety.  No memory loss.  OBJ- Physical Exam + obese General- Alert, Oriented, Affect-appropriate, Distress- none acute Skin- rash-none, lesions- none, excoriation- none  Lymphadenopathy- none Head- atraumatic            Eyes- Gross vision intact, PERRLA, conjunctivae and secretions clear            Ears- Hearing, canals-normal            Nose- Clear, no-Septal dev, mucus, polyps, erosion, perforation             Throat- Mallampati II , mucosa clear , drainage- none, tonsils- atrophic, + dentures Neck- flexible , trachea midline, no stridor , thyroid nl, carotid no bruit Chest - symmetrical excursion , unlabored           Heart/CV- RRR , no murmur , no gallop  , no rub, nl s1 s2                            - JVD- none , edema tr to +1, stasis changes- none, varices- none           Lung- clear to P&A, wheeze- none, cough- none , dullness-none, rub- none           Chest wall-  Abd-  Br/ Gen/ Rectal- Not done, not indicated Extrem- cyanosis- none, clubbing, none, atrophy- none, strength- nl, + walker Neuro- grossly intact to observation

## 2014-06-14 NOTE — Patient Instructions (Signed)
Continue on current regimen .  Wear oxygen with activity and At bedtime   Low salt diet  Legs elevated.  follow up Dr. Annamaria Boots  In 6-8 weeks and As needed   Please contact office for sooner follow up if symptoms do not improve or worsen or seek emergency care

## 2014-06-15 DIAGNOSIS — J9611 Chronic respiratory failure with hypoxia: Secondary | ICD-10-CM | POA: Insufficient documentation

## 2014-06-15 NOTE — Assessment & Plan Note (Signed)
Recent flare with AECOPD and flu  Now resolving  Cont on lasix dose  Low salt diet  Legs elevated O2 to keep sats >90%

## 2014-06-15 NOTE — Assessment & Plan Note (Signed)
Recent flare now resolving  Cont on curretn regimen  follow up in 6 weeks

## 2014-06-15 NOTE — Assessment & Plan Note (Signed)
O2 to keep sats >90%.  

## 2014-06-20 ENCOUNTER — Other Ambulatory Visit: Payer: Self-pay | Admitting: Internal Medicine

## 2014-06-20 DIAGNOSIS — R350 Frequency of micturition: Secondary | ICD-10-CM

## 2014-06-20 DIAGNOSIS — R3915 Urgency of urination: Secondary | ICD-10-CM

## 2014-06-26 ENCOUNTER — Other Ambulatory Visit: Payer: Self-pay | Admitting: Internal Medicine

## 2014-06-26 ENCOUNTER — Ambulatory Visit
Admission: RE | Admit: 2014-06-26 | Discharge: 2014-06-26 | Disposition: A | Discharge status: Home / Self Care | Payer: PPO | Source: Ambulatory Visit | Attending: Internal Medicine | Admitting: Internal Medicine

## 2014-06-26 DIAGNOSIS — R3915 Urgency of urination: Secondary | ICD-10-CM

## 2014-06-26 DIAGNOSIS — R350 Frequency of micturition: Secondary | ICD-10-CM

## 2014-06-29 ENCOUNTER — Encounter: Payer: Self-pay | Admitting: Internal Medicine

## 2014-07-10 ENCOUNTER — Other Ambulatory Visit (HOSPITAL_COMMUNITY): Payer: Self-pay | Admitting: Internal Medicine

## 2014-07-10 ENCOUNTER — Ambulatory Visit (HOSPITAL_COMMUNITY)
Admission: RE | Admit: 2014-07-10 | Discharge: 2014-07-10 | Disposition: A | Discharge status: Home / Self Care | Payer: PPO | Source: Ambulatory Visit | Attending: Internal Medicine | Admitting: Internal Medicine

## 2014-07-10 DIAGNOSIS — R609 Edema, unspecified: Secondary | ICD-10-CM

## 2014-07-10 DIAGNOSIS — R52 Pain, unspecified: Secondary | ICD-10-CM

## 2014-07-10 DIAGNOSIS — M7989 Other specified soft tissue disorders: Secondary | ICD-10-CM | POA: Diagnosis not present

## 2014-07-10 DIAGNOSIS — M79604 Pain in right leg: Secondary | ICD-10-CM | POA: Insufficient documentation

## 2014-07-10 NOTE — Progress Notes (Signed)
Right lower extremity venous duplex completed.  Right:  No evidence of DVT or superficial thrombosis.  There appears to be a Baker's cyst in the right popliteal fossa.  Left:  Negative for DVT in the common femoral vein.

## 2014-08-02 ENCOUNTER — Ambulatory Visit (INDEPENDENT_AMBULATORY_CARE_PROVIDER_SITE_OTHER): Payer: PPO | Admitting: Internal Medicine

## 2014-08-02 VITALS — BP 116/64 | HR 64

## 2014-08-02 DIAGNOSIS — J9611 Chronic respiratory failure with hypoxia: Secondary | ICD-10-CM | POA: Diagnosis not present

## 2014-08-02 DIAGNOSIS — G478 Other sleep disorders: Secondary | ICD-10-CM | POA: Diagnosis not present

## 2014-08-02 DIAGNOSIS — J441 Chronic obstructive pulmonary disease with (acute) exacerbation: Secondary | ICD-10-CM | POA: Diagnosis not present

## 2014-08-02 DIAGNOSIS — G4730 Sleep apnea, unspecified: Secondary | ICD-10-CM

## 2014-08-02 NOTE — Patient Instructions (Signed)
We can continue present meds and the O2 at 2L with activity and sleep  Please call if we can help

## 2014-08-02 NOTE — Progress Notes (Signed)
03/07/14- 35 yoF former smoker referred courtesy of Dr Rayann Heman for sleep medicine evaluation. Son here Complicated by hx breast CaL She says she sleeps "fine" except for nocturia every 2 hours. Widowed, not aware of snoring. Admits some daytime sleepiness. Smoked for 21 years, quitting at age 81. History of intermittent asthma without rhinitis. Had pneumonia last year. Sometimes wheezes but denies coughing. Using Symbicort 2 puffs, once daily and rare need for rescue inhaler.  Dyspnea on exertion has increased in the past year-walking one or 2 rooms, making bed. Weight increased.. Feet swell if she sits a lot. Cardiology follows for atrial fibrillation/Xarelto She had worked in a [place] with incidental dust. PFT 10/26/13- Moderately severe obstructive disease, no response, possible restriction, mild Diffusion reduction.FVC 2.18/ 75%, FEV1 1.30/ 59%, FEV1/FVC 0.60,  DLCO 71%, TLC 99% CXR 08/15/13- IMPRESSION: No acute cardiopulmonary disease. No explanation for left-sided chest pain. Electronically Signed  By: Abigail Miyamoto M.D.  On: 08/15/2013  05/10/14- 80 yoF former smoker followed for COPD/ FEV1 59%. Son here SOB w/activity;chest tightness; wheezing; Complicated by hx L breast Ca, dCHF, PAF Overnight oximetry on 03/10/2014 qualified for oxygen at 2 L during sleep but she is reluctant. Says she used oxygen in the past and "couldn't keep it on". Continues Symbicort. Did not like Spiriva. Unwilling to consider a sleep study.  06/14/2014 post hospital follow-up Returns for a post hospital follow-up Patient was re- admitted April 19 through April 23 for acute on chronic diastolic heart failure exacerbation with acute respiratory failure and COPD exacerbation. She was treated with IV antibiotics, steroids, and nebulized bronchodilators. She did require aggressive diuresis. Discharged on a prednisone taper and antibiotics Started on oxygen with act and At bedtime   Remains on  Symbicort. Since discharge. Patient is feeling weak and run down . Has lingering cough and congestion . No fever, chest pain, orthopnea or increased edema.  On lasix 40mg  Twice daily  .  Prior to this admission. Patient had been in the hospital April 9 for COPD flare with influenza. Treated with antibiotics, steroids, and Tamiflu. She denies any hemoptysis, orthopnea, PND, or increased leg swelling.  08/02/14- 48 yoF former smoker followed for COPD. Son here  Complicated by hx L breast Ca, dCHF, PAF FOLLOWS FOR:  Reports SOB. Denies chest tightness, wheezing or coughing. Has been to the hospital twice since her last OV- flu and chf/COPD. O2 2L activity and sleep/ Advanced Rarely needs rescue inhaler but continues Symbicort daily. Oxygen helps. CXR 1V 05/23/14 FINDINGS: The heart size and mediastinal contours are within normal limits. Both lungs are clear. The visualized skeletal structures are unremarkable. IMPRESSION: No active disease. Electronically Signed  By: Franchot Gallo M.D.  On: 05/23/2014 11:46   ROS-see HPI Constitutional:   No-   weight loss, night sweats, fevers, chills, fatigue, lassitude. HEENT:   No-  headaches, difficulty swallowing, tooth/dental problems, sore throat,       No-  sneezing, itching, ear ache, nasal congestion, post nasal drip,  CV:  No-   chest pain, orthopnea, PND, swelling in lower extremities, anasarca,                                  dizziness,  Resp: + shortness of breath with exertion or at rest.              No-   productive cough,  No non-productive cough,  No- coughing up of blood.  No-   change in color of mucus.   Skin: No-   rash or lesions. GI:  No-   heartburn, indigestion, abdominal pain, nausea, vomiting, GU:  MS:  .  No- decreased range of motion.  No- back pain. Neuro-     nothing unusual Psych:  No- change in mood or affect. No depression or anxiety.  No memory loss.  OBJ- Physical Exam + obese, + left her oxygen  in the truck, 96% RA General- Alert, Oriented, Affect-appropriate, Distress- none acute Skin- rash-none, lesions- none, excoriation- none Lymphadenopathy- none Head- atraumatic            Eyes- Gross vision intact, PERRLA, conjunctivae and secretions clear            Ears- Hearing, canals-normal            Nose- Clear, no-Septal dev, mucus, polyps, erosion, perforation             Throat- Mallampati II , mucosa clear , drainage- none, tonsils- atrophic, + dentures Neck- flexible , trachea midline, no stridor , thyroid nl, carotid no bruit Chest - symmetrical excursion , unlabored           Heart/CV- RRR , no murmur , no gallop  , no rub, nl s1 s2                           - JVD- none , edema+1, stasis changes- none, varices- none           Lung- clear to P&A, wheeze- none, cough- none , dullness-none, rub- none           Chest wall-  Abd-  Br/ Gen/ Rectal- Not done, not indicated Extrem- cyanosis- none, clubbing, none, atrophy- none, strength- nl, + walker Neuro- grossly intact to observation

## 2014-08-05 ENCOUNTER — Encounter: Payer: Self-pay | Admitting: Internal Medicine

## 2014-08-05 NOTE — Assessment & Plan Note (Signed)
Currently under better control but exacerbation requiring hospital stay the spring was apparently a mixture of COPD and diastolic heart failure

## 2014-08-05 NOTE — Assessment & Plan Note (Signed)
Continues oxygen for sleep with some encouragement

## 2014-08-05 NOTE — Assessment & Plan Note (Signed)
Better control today with room air oxygen saturation 96% at rest. She left her oxygen in her truck. We discussed oxygen for sleep, and goals.

## 2014-09-05 ENCOUNTER — Telehealth: Payer: Self-pay

## 2014-09-05 NOTE — Telephone Encounter (Signed)
Patient called in asking for Xarelto 20 mg samples.  Gave her 3 bottles (15 total -  tablets)

## 2014-10-03 NOTE — Patient Outreach (Signed)
[name]  10/03/2014  Jordan Byrd 13-Sep-1933 [id number]   Referral from HTA tier 4 list, assigned to Tomasa Rand, [facility] for patient outreach.  [name], [place] Care Management Assistant

## 2014-10-10 ENCOUNTER — Other Ambulatory Visit: Payer: Self-pay

## 2014-10-10 NOTE — Patient Outreach (Signed)
Telephone Screening: Referral from [place].  Reviewed EPIC notes. Placed call to patients home number. No answer. No machine. Placed call to patients mobile number. No answer. No voicemail set up.   PLAN: Will attempt again.  Tomasa Rand, RN, BSN, CEN [facility] [employer] [phone]

## 2014-10-17 ENCOUNTER — Telehealth: Payer: Self-pay

## 2014-10-17 NOTE — Telephone Encounter (Signed)
Grandson called in for samples of Xarelto for [name]. Samples placed up front. Xarelto 20 MG (3 bottles)  Burtis Junes, NP at 05/23/2014 9:42 AM  rivaroxaban (XARELTO) 20 MG TABS tabletTake 1 tablet (20 mg total) by mouth d 2. Paroxysmal atrial fib - on Xarelto - remains on Flecainide as well - in sinus on physical exam today and have left her on her current regimen.

## 2014-10-18 ENCOUNTER — Other Ambulatory Visit: Payer: Self-pay

## 2014-10-18 NOTE — Patient Outreach (Addendum)
2nd screening attempt: Purpose of call to offer [facility] services.  Placed call to home number no answer. No machine. Placed call to mobile number. No voicemail set up.   Plan: Will mail outreach letter and attempt telephone outreach again. Tomasa Rand, RN, BSN, CEN [facility] [employer] [phone]

## 2014-10-31 ENCOUNTER — Other Ambulatory Visit: Payer: Self-pay

## 2014-10-31 NOTE — Patient Outreach (Signed)
Telephone screening: 3rd attempt to reach patient. Unsuccessful.  No response to previously sent letter. Will close case as unable to reach patient after 3 attempts and letter.  PLAN: Will notify MD that I was unable to reach patient.  Will send in basket message to Case Management assistant to close case.  Tomasa Rand, RN, BSN, CEN [facility] [employer] [phone]

## 2014-11-01 ENCOUNTER — Telehealth: Payer: Self-pay | Admitting: Internal Medicine

## 2014-11-01 NOTE — Patient Outreach (Signed)
[facility]) Care Management  11/01/2014  DASHAUNA HEYMANN 1934-01-22 [id number]   Notification received from Tomasa Rand, RN to close case due to inability to contact patient.  Damita L. Rhodie, [place] Care Management Assistant

## 2014-11-01 NOTE — Telephone Encounter (Signed)
Patient aware samples will be placed at the front desk. 

## 2014-11-01 NOTE — Telephone Encounter (Signed)
New Message  Pt called for samples of Xarelto

## 2014-11-17 ENCOUNTER — Telehealth: Payer: Self-pay | Admitting: Internal Medicine

## 2014-11-17 NOTE — Telephone Encounter (Signed)
New message      Patient calling the office for samples of medication:   1.  What medication and dosage are you requesting samples for? xarelto 20mg   2.  Are you currently out of this medication? yes  3. Are you requesting samples to get you through until you receive your prescription? No, pt is in the donut hole--

## 2014-11-20 NOTE — Telephone Encounter (Signed)
Left a voicemail message for patient to call back.

## 2014-11-20 NOTE — Telephone Encounter (Signed)
Spoke with patient's son. Advised him we could give her a few weeks worth of Xarelto. They have tried for Patient Assist from pharm co, but were denied. i have given them a form for Medicare Low Income Subsidy Program.. 3 bottles of xarelto 20 mg provided.

## 2014-12-04 ENCOUNTER — Ambulatory Visit (INDEPENDENT_AMBULATORY_CARE_PROVIDER_SITE_OTHER): Payer: PPO | Admitting: Internal Medicine

## 2014-12-04 ENCOUNTER — Encounter: Payer: Self-pay | Admitting: Internal Medicine

## 2014-12-04 VITALS — BP 126/78 | HR 55 | Ht 66.0 in | Wt 255.0 lb

## 2014-12-04 DIAGNOSIS — J9611 Chronic respiratory failure with hypoxia: Secondary | ICD-10-CM | POA: Diagnosis not present

## 2014-12-04 DIAGNOSIS — J449 Chronic obstructive pulmonary disease, unspecified: Secondary | ICD-10-CM

## 2014-12-04 NOTE — Patient Instructions (Signed)
Glad you are doing well. We don't need to make any changes this visit. Please call if we can help

## 2014-12-04 NOTE — Progress Notes (Signed)
03/07/14- 81 yoF former smoker referred courtesy of Dr Rayann Heman for sleep medicine evaluation. Son here Complicated by hx breast CaL She says she sleeps "fine" except for nocturia every 2 hours. Widowed, not aware of snoring. Admits some daytime sleepiness. Smoked for 21 years, quitting at age 81. History of intermittent asthma without rhinitis. Had pneumonia last year. Sometimes wheezes but denies coughing. Using Symbicort 2 puffs, once daily and rare need for rescue inhaler.  Dyspnea on exertion has increased in the past year-walking one or 2 rooms, making bed. Weight increased.. Feet swell if she sits a lot. Cardiology follows for atrial fibrillation/Xarelto She had worked in a [place] with incidental dust. PFT 10/26/13- Moderately severe obstructive disease, no response, possible restriction, mild Diffusion reduction.FVC 2.18/ 75%, FEV1 1.30/ 59%, FEV1/FVC 0.60,  DLCO 71%, TLC 99% CXR 08/15/13- IMPRESSION: No acute cardiopulmonary disease. No explanation for left-sided chest pain. Electronically Signed  By: Abigail Miyamoto M.D.  On: 08/15/2013  05/10/14- 81 yoF former smoker followed for COPD/ FEV1 59%. Son here SOB w/activity;chest tightness; wheezing; Complicated by hx L breast Ca, dCHF, PAF Overnight oximetry on 03/10/2014 qualified for oxygen at 2 L during sleep but she is reluctant. Says she used oxygen in the past and "couldn't keep it on". Continues Symbicort. Did not like Spiriva. Unwilling to consider a sleep study.  06/14/2014 post hospital follow-up Returns for a post hospital follow-up Patient was re- admitted April 19 through April 23 for acute on chronic diastolic heart failure exacerbation with acute respiratory failure and COPD exacerbation. She was treated with IV antibiotics, steroids, and nebulized bronchodilators. She did require aggressive diuresis. Discharged on a prednisone taper and antibiotics Started on oxygen with act and At bedtime   Remains on  Symbicort. Since discharge. Patient is feeling weak and run down . Has lingering cough and congestion . No fever, chest pain, orthopnea or increased edema.  On lasix 40mg  Twice daily  .  Prior to this admission. Patient had been in the hospital April 9 for COPD flare with influenza. Treated with antibiotics, steroids, and Tamiflu. She denies any hemoptysis, orthopnea, PND, or increased leg swelling.  08/02/14- 81 yoF former smoker followed for COPD. Son here  Complicated by hx L breast Ca, dCHF, PAF FOLLOWS FOR:  Reports SOB. Denies chest tightness, wheezing or coughing. Has been to the hospital twice since her last OV- flu and chf/COPD. O2 2L activity and sleep/ Advanced Rarely needs rescue inhaler but continues Symbicort daily. Oxygen helps. CXR 1V 05/23/14 FINDINGS: The heart size and mediastinal contours are within normal limits. Both lungs are clear. The visualized skeletal structures are unremarkable. IMPRESSION: No active disease. Electronically Signed  By: Franchot Gallo M.D.  On: 05/23/2014 11:46  12/04/14- 81 yoF former smoker followed for COPD, chronic respiratory failure with hypoxia. Son here FOLLOWS FOR: uses O2 at night and daytime as needed(trips over cord during the night).  Pt states she has occasional SOB and wheezing with activity. Feels "wonderful". Continues oxygen 2 L/Advanced Meds okay. Had flu vaccine  ROS-see HPI Constitutional:   No-   weight loss, night sweats, fevers, chills, fatigue, lassitude. HEENT:   No-  headaches, difficulty swallowing, tooth/dental problems, sore throat,       No-  sneezing, itching, ear ache, nasal congestion, post nasal drip,  CV:  No-   chest pain, orthopnea, PND, swelling in lower extremities, anasarca,  dizziness,  Resp: + shortness of breath with exertion or at rest.              No-   productive cough,  No non-productive cough,  No- coughing up of blood.              No-   change in color of mucus.   Skin: No-   rash or lesions. GI:  No-   heartburn, indigestion, abdominal pain, nausea, vomiting, GU:  MS:  .  No- decreased range of motion.  No- back pain. Neuro-     nothing unusual Psych:  No- change in mood or affect. No depression or anxiety.  No memory loss.  OBJ- Physical Exam + obese, + left her oxygen in the truck, 95% RA General- Alert, Oriented, Affect-appropriate, Distress- none acute Skin- rash-none, lesions- none, excoriation- none Lymphadenopathy- none Head- atraumatic            Eyes- Gross vision intact, PERRLA, conjunctivae and secretions clear            Ears- Hearing, canals-normal            Nose- Clear, no-Septal dev, mucus, polyps, erosion, perforation             Throat- Mallampati II , mucosa clear , drainage- none, tonsils- atrophic, + dentures Neck- flexible , trachea midline, no stridor , thyroid nl, carotid no bruit Chest - symmetrical excursion , unlabored           Heart/CV- RRR , no murmur , no gallop  , no rub, nl s1 s2                           - JVD- none , edema+1, stasis changes- none, varices- none           Lung- clear to P&A, wheeze- none, cough- none , dullness-none, rub- none           Chest wall-  Abd-  Br/ Gen/ Rectal- Not done, not indicated Extrem- cyanosis- none, clubbing, none, atrophy- none, strength- nl, + walker Neuro- grossly intact to observation

## 2014-12-19 ENCOUNTER — Telehealth: Payer: Self-pay | Admitting: Internal Medicine

## 2014-12-19 NOTE — Telephone Encounter (Signed)
New message      Patient calling the office for samples of medication:   1.  What medication and dosage are you requesting samples for? xarelto 20mg   2.  Are you currently out of this medication? Have a few pills left

## 2014-12-19 NOTE — Assessment & Plan Note (Signed)
Adequately controlled without acute exacerbation at this visit

## 2014-12-19 NOTE — Telephone Encounter (Signed)
Called pt to inform her that we did not have any samples of Xarelto 20 mg tablets and that she could call back later in the week to see if we have gotten any samples in. I advised the pt that if she has any other problems, questions or concerns to call the office. Pt verbalized understanding.

## 2014-12-19 NOTE — Assessment & Plan Note (Signed)
Continues to use oxygen for sleep and as needed while awake. On good days she can be at rest without oxygen.

## 2014-12-21 ENCOUNTER — Telehealth: Payer: Self-pay | Admitting: Internal Medicine

## 2014-12-21 NOTE — Telephone Encounter (Signed)
New message      Patient calling the office for samples of medication:   1.  What medication and dosage are you requesting samples for? xarelto 20mg  2.  Are you currently out of this medication? No Please call when we get samples if we are out

## 2014-12-21 NOTE — Telephone Encounter (Signed)
Called pt and spoke to pt's son Jordan Byrd, informing him that we did not have any samples of Xarelto 20 mg tablets and that he could call back next week to see if we have gotten any samples in. I advised if the pt has any other problems, questions or concerns to call our office. Son verbalized understanding.

## 2014-12-25 ENCOUNTER — Other Ambulatory Visit: Payer: Self-pay

## 2014-12-25 NOTE — Patient Outreach (Signed)
[facility]) Care Management  12/25/2014  Jordan Byrd [birth year] [id number]  Screening call completed for patient referred fromHTA Tier 3 list.  Patient lives alone but has multiple family members  -sons, grandsons, nieces- who live very close to her and who are attentive to her needs.  Patient states she has CHF and lung disease and HTN, but that currently she is doing well.  States she does not weigh daily.  Education provided re managing CHF with daily recorded weights.  Patient is short of breath with exertion.  Uses oxygen prn and at night.  She states she is able to do ADLs independently with the use of assistive DME.  States she has not had any recent falls, but some "stumbles" where she was able to catch herself.  Fall prevention strategies were discussed. She takes about 12 medications, but denies any questions or problems with them.  Pharmacy services were offered and declined.  She sees her PCP, cardiologist, and respiratory specialist on a regular basis.  She reports 2 hospitalizations this year. States she had her flu shot in September.  Explanation of [facility] services provided.  She declined services for now, but agreed for me to send her a pamphlet with contact information for future needs.  Plan:  Send pamphlet to patient.           Send letter to MD re patient declining services.  Candie Mile, RN, MSN [place] [phone] Fax [phone]

## 2014-12-27 ENCOUNTER — Telehealth: Payer: Self-pay | Admitting: Internal Medicine

## 2014-12-27 NOTE — Telephone Encounter (Signed)
New message      Patient calling the office for samples of medication:   1.  What medication and dosage are you requesting samples for?  xarelto 20mg   2.  Are you currently out of this medication?  Have a few pills left

## 2014-12-27 NOTE — Telephone Encounter (Signed)
Called pt and left a message informing her that I have left samples of Xarelto 20 mg tablets, enough to last for a month and if she has any other problems, questions or concerns to call the office.

## 2015-02-09 ENCOUNTER — Inpatient Hospital Stay (HOSPITAL_COMMUNITY)
Admission: EM | Admit: 2015-02-09 | Discharge: 2015-02-15 | DRG: 190 | Disposition: A | Discharge status: Home Health | Payer: PPO | Attending: Internal Medicine | Admitting: Internal Medicine

## 2015-02-09 ENCOUNTER — Encounter (HOSPITAL_COMMUNITY): Payer: Self-pay | Admitting: Family Medicine

## 2015-02-09 ENCOUNTER — Emergency Department (HOSPITAL_COMMUNITY): Payer: PPO

## 2015-02-09 DIAGNOSIS — M1990 Unspecified osteoarthritis, unspecified site: Secondary | ICD-10-CM | POA: Diagnosis present

## 2015-02-09 DIAGNOSIS — I480 Paroxysmal atrial fibrillation: Secondary | ICD-10-CM | POA: Diagnosis not present

## 2015-02-09 DIAGNOSIS — Z833 Family history of diabetes mellitus: Secondary | ICD-10-CM | POA: Diagnosis not present

## 2015-02-09 DIAGNOSIS — R0602 Shortness of breath: Secondary | ICD-10-CM | POA: Diagnosis not present

## 2015-02-09 DIAGNOSIS — Z79899 Other long term (current) drug therapy: Secondary | ICD-10-CM

## 2015-02-09 DIAGNOSIS — Z8249 Family history of ischemic heart disease and other diseases of the circulatory system: Secondary | ICD-10-CM

## 2015-02-09 DIAGNOSIS — Z87891 Personal history of nicotine dependence: Secondary | ICD-10-CM

## 2015-02-09 DIAGNOSIS — I130 Hypertensive heart and chronic kidney disease with heart failure and stage 1 through stage 4 chronic kidney disease, or unspecified chronic kidney disease: Secondary | ICD-10-CM | POA: Diagnosis not present

## 2015-02-09 DIAGNOSIS — N183 Chronic kidney disease, stage 3 unspecified: Secondary | ICD-10-CM

## 2015-02-09 DIAGNOSIS — K219 Gastro-esophageal reflux disease without esophagitis: Secondary | ICD-10-CM | POA: Diagnosis not present

## 2015-02-09 DIAGNOSIS — R069 Unspecified abnormalities of breathing: Secondary | ICD-10-CM | POA: Diagnosis not present

## 2015-02-09 DIAGNOSIS — I5032 Chronic diastolic (congestive) heart failure: Secondary | ICD-10-CM | POA: Diagnosis present

## 2015-02-09 DIAGNOSIS — C50912 Malignant neoplasm of unspecified site of left female breast: Secondary | ICD-10-CM | POA: Diagnosis present

## 2015-02-09 DIAGNOSIS — Z7901 Long term (current) use of anticoagulants: Secondary | ICD-10-CM

## 2015-02-09 DIAGNOSIS — Z96641 Presence of right artificial hip joint: Secondary | ICD-10-CM | POA: Diagnosis present

## 2015-02-09 DIAGNOSIS — R739 Hyperglycemia, unspecified: Secondary | ICD-10-CM | POA: Diagnosis present

## 2015-02-09 DIAGNOSIS — Z803 Family history of malignant neoplasm of breast: Secondary | ICD-10-CM

## 2015-02-09 DIAGNOSIS — J441 Chronic obstructive pulmonary disease with (acute) exacerbation: Secondary | ICD-10-CM | POA: Diagnosis not present

## 2015-02-09 DIAGNOSIS — T380X5A Adverse effect of glucocorticoids and synthetic analogues, initial encounter: Secondary | ICD-10-CM | POA: Diagnosis present

## 2015-02-09 DIAGNOSIS — J45909 Unspecified asthma, uncomplicated: Secondary | ICD-10-CM | POA: Diagnosis present

## 2015-02-09 DIAGNOSIS — N179 Acute kidney failure, unspecified: Secondary | ICD-10-CM | POA: Diagnosis not present

## 2015-02-09 DIAGNOSIS — M25512 Pain in left shoulder: Secondary | ICD-10-CM | POA: Insufficient documentation

## 2015-02-09 DIAGNOSIS — Z888 Allergy status to other drugs, medicaments and biological substances status: Secondary | ICD-10-CM

## 2015-02-09 DIAGNOSIS — J9611 Chronic respiratory failure with hypoxia: Secondary | ICD-10-CM | POA: Diagnosis not present

## 2015-02-09 DIAGNOSIS — J9621 Acute and chronic respiratory failure with hypoxia: Secondary | ICD-10-CM | POA: Diagnosis not present

## 2015-02-09 DIAGNOSIS — Z885 Allergy status to narcotic agent status: Secondary | ICD-10-CM

## 2015-02-09 DIAGNOSIS — H3530 Unspecified macular degeneration: Secondary | ICD-10-CM | POA: Diagnosis not present

## 2015-02-09 DIAGNOSIS — J189 Pneumonia, unspecified organism: Secondary | ICD-10-CM

## 2015-02-09 DIAGNOSIS — Z9981 Dependence on supplemental oxygen: Secondary | ICD-10-CM | POA: Diagnosis not present

## 2015-02-09 DIAGNOSIS — Z7951 Long term (current) use of inhaled steroids: Secondary | ICD-10-CM

## 2015-02-09 DIAGNOSIS — Z886 Allergy status to analgesic agent status: Secondary | ICD-10-CM

## 2015-02-09 DIAGNOSIS — J4531 Mild persistent asthma with (acute) exacerbation: Secondary | ICD-10-CM

## 2015-02-09 DIAGNOSIS — I10 Essential (primary) hypertension: Secondary | ICD-10-CM | POA: Diagnosis present

## 2015-02-09 LAB — BASIC METABOLIC PANEL
ANION GAP: 14 (ref 5–15)
BUN: 31 mg/dL — ABNORMAL HIGH (ref 6–20)
CALCIUM: 9.6 mg/dL (ref 8.9–10.3)
CO2: 28 mmol/L (ref 22–32)
Chloride: 102 mmol/L (ref 101–111)
Creatinine, Ser: 1.45 mg/dL — ABNORMAL HIGH (ref 0.44–1.00)
GFR calc Af Amer: 39 mL/min — ABNORMAL LOW (ref >60)
GFR, EST NON AFRICAN AMERICAN: 33 mL/min — AB (ref >60)
GLUCOSE: 128 mg/dL — AB (ref 65–99)
Potassium: 4 mmol/L (ref 3.5–5.1)
SODIUM: 144 mmol/L (ref 135–145)

## 2015-02-09 LAB — GLUCOSE, CAPILLARY: Glucose-Capillary: 136 mg/dL — ABNORMAL HIGH (ref 65–99)

## 2015-02-09 LAB — CBC
HCT: 41.7 % (ref 36.0–46.0)
HEMOGLOBIN: 12.8 g/dL (ref 12.0–15.0)
MCH: 29.8 pg (ref 26.0–34.0)
MCHC: 30.7 g/dL (ref 30.0–36.0)
MCV: 97.2 fL (ref 78.0–100.0)
Platelets: 258 10*3/uL (ref 150–400)
RBC: 4.29 MIL/uL (ref 3.87–5.11)
RDW: 14.2 % (ref 11.5–15.5)
WBC: 7.8 10*3/uL (ref 4.0–10.5)

## 2015-02-09 LAB — I-STAT TROPONIN, ED
TROPONIN I, POC: 0 ng/mL (ref 0.00–0.08)
Troponin i, poc: 0.01 ng/mL (ref 0.00–0.08)

## 2015-02-09 LAB — BRAIN NATRIURETIC PEPTIDE: B Natriuretic Peptide: 41.2 pg/mL (ref 0.0–100.0)

## 2015-02-09 MED ORDER — NEBIVOLOL HCL 10 MG PO TABS
20.0000 mg | ORAL_TABLET | Freq: Every day | ORAL | Status: DC
  Administered 2015-02-10 – 2015-02-15 (×6): 20 mg via ORAL
  Filled 2015-02-09 (×6): qty 2

## 2015-02-09 MED ORDER — AMLODIPINE BESYLATE 10 MG PO TABS
10.0000 mg | ORAL_TABLET | Freq: Every day | ORAL | Status: DC
Start: 2015-02-10 — End: 2015-02-15
  Administered 2015-02-10 – 2015-02-15 (×6): 10 mg via ORAL
  Filled 2015-02-09 (×6): qty 1

## 2015-02-09 MED ORDER — PREDNISONE 20 MG PO TABS
40.0000 mg | ORAL_TABLET | Freq: Once | ORAL | Status: AC
  Administered 2015-02-09: 40 mg via ORAL
  Filled 2015-02-09: qty 2

## 2015-02-09 MED ORDER — BUDESONIDE-FORMOTEROL FUMARATE 160-4.5 MCG/ACT IN AERO
1.0000 | INHALATION_SPRAY | Freq: Every day | RESPIRATORY_TRACT | Status: DC
  Filled 2015-02-09: qty 6

## 2015-02-09 MED ORDER — INSULIN ASPART 100 UNIT/ML ~~LOC~~ SOLN
0.0000 [IU] | Freq: Three times a day (TID) | SUBCUTANEOUS | Status: DC
Start: 2015-02-10 — End: 2015-02-15
  Administered 2015-02-10: 2 [IU] via SUBCUTANEOUS
  Administered 2015-02-10 – 2015-02-13 (×10): 1 [IU] via SUBCUTANEOUS
  Administered 2015-02-13: 3 [IU] via SUBCUTANEOUS
  Administered 2015-02-14 (×3): 1 [IU] via SUBCUTANEOUS

## 2015-02-09 MED ORDER — FUROSEMIDE 20 MG PO TABS
20.0000 mg | ORAL_TABLET | Freq: Two times a day (BID) | ORAL | Status: DC
  Administered 2015-02-09 – 2015-02-10 (×3): 20 mg via ORAL
  Filled 2015-02-09 (×4): qty 1

## 2015-02-09 MED ORDER — ONDANSETRON HCL 4 MG/2ML IJ SOLN: 4.0000 mg | Freq: Four times a day (QID) | INTRAMUSCULAR | Status: DC | PRN

## 2015-02-09 MED ORDER — RIVAROXABAN 20 MG PO TABS
20.0000 mg | ORAL_TABLET | Freq: Every day | ORAL | Status: DC
  Administered 2015-02-10 – 2015-02-11 (×2): 20 mg via ORAL
  Filled 2015-02-09 (×2): qty 1

## 2015-02-09 MED ORDER — LOSARTAN POTASSIUM 50 MG PO TABS
100.0000 mg | ORAL_TABLET | Freq: Every day | ORAL | Status: DC
  Administered 2015-02-10 – 2015-02-11 (×2): 100 mg via ORAL
  Filled 2015-02-09 (×2): qty 2

## 2015-02-09 MED ORDER — SODIUM CHLORIDE 0.9 % IJ SOLN
3.0000 mL | Freq: Two times a day (BID) | INTRAMUSCULAR | Status: DC
  Administered 2015-02-09 – 2015-02-14 (×11): 3 mL via INTRAVENOUS

## 2015-02-09 MED ORDER — IPRATROPIUM-ALBUTEROL 0.5-2.5 (3) MG/3ML IN SOLN
3.0000 mL | Freq: Four times a day (QID) | RESPIRATORY_TRACT | Status: DC
  Administered 2015-02-09 – 2015-02-10 (×4): 3 mL via RESPIRATORY_TRACT
  Filled 2015-02-09 (×5): qty 3

## 2015-02-09 MED ORDER — ADULT MULTIVITAMIN W/MINERALS CH
1.0000 | ORAL_TABLET | Freq: Every day | ORAL | Status: DC
  Administered 2015-02-10 – 2015-02-15 (×6): 1 via ORAL
  Filled 2015-02-09 (×6): qty 1

## 2015-02-09 MED ORDER — SODIUM CHLORIDE 0.9 % IJ SOLN: 3.0000 mL | INTRAMUSCULAR | Status: DC | PRN

## 2015-02-09 MED ORDER — ACETAMINOPHEN 325 MG PO TABS
650.0000 mg | ORAL_TABLET | ORAL | Status: DC | PRN
  Administered 2015-02-10 – 2015-02-13 (×5): 650 mg via ORAL
  Filled 2015-02-09 (×8): qty 2

## 2015-02-09 MED ORDER — SODIUM CHLORIDE 0.9 % IV SOLN: 250.0000 mL | INTRAVENOUS | Status: DC | PRN

## 2015-02-09 MED ORDER — METHYLPREDNISOLONE SODIUM SUCC 125 MG IJ SOLR
60.0000 mg | Freq: Four times a day (QID) | INTRAMUSCULAR | Status: DC
  Administered 2015-02-09 – 2015-02-14 (×18): 60 mg via INTRAVENOUS
  Filled 2015-02-09 (×19): qty 2

## 2015-02-09 MED ORDER — IPRATROPIUM-ALBUTEROL 0.5-2.5 (3) MG/3ML IN SOLN: 3.0000 mL | RESPIRATORY_TRACT | Status: DC | PRN

## 2015-02-09 MED ORDER — IPRATROPIUM-ALBUTEROL 0.5-2.5 (3) MG/3ML IN SOLN
3.0000 mL | RESPIRATORY_TRACT | Status: AC
  Administered 2015-02-09: 3 mL via RESPIRATORY_TRACT
  Filled 2015-02-09: qty 3

## 2015-02-09 MED ORDER — FLECAINIDE ACETATE 100 MG PO TABS
100.0000 mg | ORAL_TABLET | Freq: Two times a day (BID) | ORAL | Status: DC
  Administered 2015-02-10 – 2015-02-15 (×12): 100 mg via ORAL
  Filled 2015-02-09 (×16): qty 1

## 2015-02-09 MED ORDER — PANTOPRAZOLE SODIUM 40 MG PO TBEC
40.0000 mg | DELAYED_RELEASE_TABLET | Freq: Every day | ORAL | Status: DC
  Administered 2015-02-10 – 2015-02-15 (×6): 40 mg via ORAL
  Filled 2015-02-09 (×6): qty 1

## 2015-02-09 NOTE — ED Provider Notes (Signed)
CSN: [id number]     Arrival date & time 02/09/15  1302 History   First MD Initiated Contact with Patient 02/09/15 1516     Chief Complaint  Patient presents with  . Shortness of Breath     (Consider location/radiation/quality/duration/timing/severity/associated sxs/prior Treatment) Patient is a 82 y.o. female presenting with shortness of breath. The history is provided by the patient.  Shortness of Breath Severity:  Moderate Onset quality:  Sudden Duration:  3 days Timing:  Constant Progression:  Worsening Chronicity:  New Context: activity   Relieved by:  Nothing Worsened by:  Nothing tried Ineffective treatments:  None tried Associated symptoms: no chest pain, no fever, no headaches, no vomiting and no wheezing   Risk factors: obesity   Risk factors: no hx of cancer, no hx of PE/DVT, no oral contraceptive use, no prolonged immobilization and no recent surgery     82 yo F with a chief complaint shortness of breath. This been going on for the past 3 days. Patient is called her primary physician every day for this. Was started on Lasix for likely fluid overload. Patient had improvement of her lower extreme edema however continues to be short of breath on exertion. Patient has had some pressure on her chest. Patient denies any radiation denies cough congestion fevers. Patient denies any abdominal pain. Denies history of PE. Denies recent surgery denies unilateral swelling denies hemoptysis.  Past Medical History  Diagnosis Date  . Asthma   . Arthritis   . Hypertension   . Atrial fibrillation ([place])     a. 04/04/11  . Osteoarthritis     a. 03/28/11 - R Total Hip Arthroplasty  . Cataracts, both eyes   . Pneumonia   . Bronchitis   . Pleurisy   . Ankle swelling   . Kidney infection   . Frequent urination   . Excessive urination at night   . Weight gain   . CHF (congestive heart failure) ([place])   . Shortness of breath     doe-  . Anemia   . Anxiety     pt denies this  . GERD  (gastroesophageal reflux disease)     [name] Prilosec  . Chronic headaches     no longer having headaches  . Macular degeneration   . Breast cancer ([place]) 05/19/13    left breast stage IIA    Past Surgical History  Procedure Laterality Date  . Cardiac catheterization      1980's or 90's - reportedly normal  . Abdominal hysterectomy  1962  . Appendectomy  1954  . Cholecystectomy    . Back surgery  1990  . Cataract extraction bilateral w/ anterior vitrectomy  2007/2008  . Total hip arthroplasty  03/28/2011    Procedure: TOTAL HIP ARTHROPLASTY;  Surgeon: Yvette Rack., MD;  Location: [facility];  Service: Orthopedics;  Laterality: Right;  . Cardioversion  04/05/2011    Procedure: CARDIOVERSION;  Surgeon: Coralyn Mark, MD;  Location: [facility];  Service: Cardiovascular;  Laterality: N/A;  . Tonsillectomy  1943  . Adenoidectomy  1949  . Foot surgery  1947  . Thoracic disc surgery  2008  . Lumbar disc surgery  2010  . Knee surgery       both knees  . Shoulder surgery      left  . Total hip arthroplasty  03/28/2011    right  . Cardioversion N/A 06/19/2012    Procedure: CARDIOVERSION-bedside(3W36);  Surgeon: Lelon Perla, MD;  Location: [facility]  OR;  Service: Cardiovascular;  Laterality: N/A;  . Colonoscopy    . Breast lumpectomy with needle localization Left 07/14/2013    Procedure: BREAST LUMPECTOMY WITH NEEDLE LOCALIZATION;  Surgeon: Stark Klein, MD;  Location: [facility] OR;  Service: General;  Laterality: Left;   Family History  Problem Relation Age of Onset  . Diabetes Father     Father, Mother, 4 sisters (2 living)  . Heart attack Father     (Deceased)  . Heart failure Father     (deceased 65)  . Hypertension Father   . Stroke Mother     (deceased 62)  . Hypertension Mother   . Cancer Sister 18    breast cancer  . Heart attack Sister   . Diabetes Sister   . Cancer Other 16    niece with breast cancer   Social History  Substance Use Topics  . Smoking status: Former Smoker -- 1.00  packs/day for 21 years    Types: Cigarettes    Quit date: 03/17/1970  . Smokeless tobacco: Never Used  . Alcohol Use: No   OB History    Obstetric Comments   Menses age 19 No pregnancies 3 step-children     Review of Systems  Constitutional: Negative for fever and chills.  HENT: Negative for congestion and rhinorrhea.   Eyes: Negative for redness and visual disturbance.  Respiratory: Positive for shortness of breath. Negative for wheezing.   Cardiovascular: Negative for chest pain and palpitations.  Gastrointestinal: Negative for nausea and vomiting.  Genitourinary: Negative for dysuria and urgency.  Musculoskeletal: Negative for myalgias and arthralgias.  Skin: Negative for pallor and wound.  Neurological: Negative for dizziness and headaches.      Allergies  Methocarbamol; Vicodin; Aspirin; Butazolidin; Celebrex; Codeine; Doripenem; Aspirin buf(alhyd-mghyd-cacar); Mucinex; and Temazepam  Home Medications   Prior to Admission medications   Medication Sig Start Date End Date Taking? Authorizing Provider  acetaminophen (TYLENOL) 500 MG tablet Take 1,000 mg by mouth 2 (two) times daily.   Yes Historical Provider, MD  albuterol (PROVENTIL HFA;VENTOLIN HFA) 108 (90 BASE) MCG/ACT inhaler Inhale 1 puff into the lungs every 6 (six) hours as needed for wheezing.    Yes Historical Provider, MD  albuterol (PROVENTIL) (2.5 MG/3ML) 0.083% nebulizer solution Take 3 mLs (2.5 mg total) by nebulization every 6 (six) hours as needed for wheezing or shortness of breath. 05/15/14  Yes Nishant Dhungel, MD  amLODipine (NORVASC) 10 MG tablet Take 10 mg by mouth daily.   Yes Historical Provider, MD  budesonide-formoterol (SYMBICORT) 160-4.5 MCG/ACT inhaler Inhale 1 puff into the lungs See admin instructions. Inhale 1 puff every morning, may inhale a 2nd puff in the evening if needed for wheezing   Yes Historical Provider, MD  flecainide (TAMBOCOR) 100 MG tablet Take 1 tablet (100 mg total) by mouth  every 12 (twelve) hours. 11/11/12  Yes Thompson Grayer, MD  furosemide (LASIX) 20 MG tablet Take 20-40 mg by mouth See admin instructions. Take 2 tablets (40 mg) by mouth every morning except take 1 tablet (20 mg) if going out and a 2nd tablet upon returning home   Yes Historical Provider, MD  IRON PO Take 5 mLs by mouth daily. Liquid Iron -   Yes Historical Provider, MD  losartan (COZAAR) 100 MG tablet Take 100 mg by mouth daily.   Yes Historical Provider, MD  Multiple Vitamin (MULTIVITAMIN WITH MINERALS) TABS tablet Take 1 tablet by mouth daily.   Yes Historical Provider, MD  Multiple Vitamins-Minerals (ICAPS PO)  Take 1 tablet by mouth daily.   Yes Historical Provider, MD  Nebivolol HCl (BYSTOLIC) 20 MG TABS Take 20 mg by mouth daily.    Yes Historical Provider, MD  omeprazole (PRILOSEC) 20 MG capsule Take 20 mg by mouth daily.   Yes Historical Provider, MD  rivaroxaban (XARELTO) 20 MG TABS tablet Take 1 tablet (20 mg total) by mouth daily. Patient taking differently: Take 20 mg by mouth daily with breakfast.  02/23/14  Yes Thompson Grayer, MD  potassium chloride (K-DUR,KLOR-CON) 10 MEQ tablet Take 1 tablet (10 mEq total) by mouth daily. Patient not taking: Reported on 02/09/2015 05/27/14   Janece Canterbury, MD   BP 144/75 mmHg  Pulse 69  Temp(Src) 97.6 F (36.4 C) (Oral)  Resp 18  Ht 5\' 6"  (1.676 m)  Wt 250 lb 8 oz (113.626 kg)  BMI 40.45 kg/m2  SpO2 98% Physical Exam  Constitutional: She is oriented to person, place, and time. She appears well-developed and well-nourished. No distress.  Obese   HENT:  Head: Normocephalic and atraumatic.  Eyes: EOM are normal. Pupils are equal, round, and reactive to light.  Neck: Normal range of motion. Neck supple.  Cardiovascular: Normal rate and regular rhythm.  Exam reveals no gallop and no friction rub.   No murmur heard. Pulmonary/Chest: Effort normal. She has no wheezes. She has no rales.  Diminished breath sounds with wheezes noted in all fields.   Abdominal: Soft. She exhibits no distension. There is no tenderness. There is no rebound and no guarding.  Musculoskeletal: She exhibits no edema or tenderness.  Neurological: She is alert and oriented to person, place, and time.  Skin: Skin is warm and dry. She is not diaphoretic.  Psychiatric: She has a normal mood and affect. Her behavior is normal.  Nursing note and vitals reviewed.   ED Course  Procedures (including critical care time) Labs Review Labs Reviewed  BASIC METABOLIC PANEL - Abnormal; Notable for the following:    Glucose, Bld 128 (*)    BUN 31 (*)    Creatinine, Ser 1.45 (*)    GFR calc non Af Amer 33 (*)    GFR calc Af Amer 39 (*)    All other components within normal limits  BASIC METABOLIC PANEL - Abnormal; Notable for the following:    Chloride 100 (*)    Glucose, Bld 136 (*)    BUN 35 (*)    Creatinine, Ser 1.51 (*)    GFR calc non Af Amer 32 (*)    GFR calc Af Amer 37 (*)    All other components within normal limits  GLUCOSE, CAPILLARY - Abnormal; Notable for the following:    Glucose-Capillary 136 (*)    All other components within normal limits  GLUCOSE, CAPILLARY - Abnormal; Notable for the following:    Glucose-Capillary 133 (*)    All other components within normal limits  GLUCOSE, CAPILLARY - Abnormal; Notable for the following:    Glucose-Capillary 150 (*)    All other components within normal limits  CBC  BRAIN NATRIURETIC PEPTIDE  CBC  HEMOGLOBIN A1C  I-STAT TROPOININ, ED  I-STAT TROPOININ, ED    Imaging Review Dg Chest 2 View  02/09/2015  CLINICAL DATA:  Shortness of breath and wheezing for 1 week. On diuresis for fluid on lungs, per physician. History of COPD, CHF, hypertension, atrial fibrillation. EXAM: CHEST  2 VIEW COMPARISON:  Chest x-rays dated 05/23/2014 and 08/15/2013. FINDINGS: Heart size is upper normal, stable. Overall cardiomediastinal silhouette  remains normal in size and configuration. Mild atherosclerotic changes again  noted at the aortic arch. Lungs are clear. No evidence of pneumonia. No pleural effusions seen. No evidence of active congestive heart failure or pulmonary edema. No pneumothorax seen. Mild degenerative changes noted at the shoulders. Anterior cervical fusion hardware again noted within the lower cervical spine. Surgical clips again noted over the left lower chest wall. No acute osseous or soft tissue abnormality seen. IMPRESSION: Lungs are clear and there is no evidence of acute cardiopulmonary abnormality. No evidence of pneumonia. No evidence of congestive heart failure. Electronically Signed   By: Franki Cabot M.D.   On: 02/09/2015 14:22   I have personally reviewed and evaluated these images and lab results as part of my medical decision-making.   EKG Interpretation   Date/Time:  Friday February 09 2015 13:09:28 EST Ventricular Rate:  76 PR Interval:  186 QRS Duration: 110 QT Interval:  394 QTC Calculation: 443 R Axis:   122 Text Interpretation:  Normal sinus rhythm Left posterior fascicular block  Cannot rule out Anterior infarct , age undetermined Abnormal ECG  morphology change to I and aVL compared to prior Otherwise no significant  change Confirmed by Kayleann Mccaffery MD, Quillian Quince [id number]) on 02/09/2015 3:23:59 PM      MDM   Final diagnoses:  COPD exacerbation ([place])    82 yo F with a chief complaint shortness of breath. Likely multifactorial with patient with multiple comorbidities. Initial troponin is negative. EKG unremarkable. Chest x-ray clear. BNP 40. Feel unlikely to be fluid overloaded with no JVD and no peripheral edema. Diffuse wheezes will treat this as a COPD exacerbation. Patient given 3 DuoNeb's and steroids with some improvement. On home O2 with normal saturations. Will ambulate.  Patient became hypoxic into the mid to low 80s on ambulation will admit.  The patients results and plan were reviewed and discussed.   Any x-rays performed were independently reviewed by myself.    Differential diagnosis were considered with the presenting HPI.  Medications  ipratropium-albuterol (DUONEB) 0.5-2.5 (3) MG/3ML nebulizer solution 3 mL (3 mLs Nebulization Given 02/09/15 1545)  multivitamin with minerals tablet 1 tablet (1 tablet Oral Given 02/10/15 1016)  furosemide (LASIX) tablet 20 mg (20 mg Oral Given 02/10/15 1016)  nebivolol (BYSTOLIC) tablet 20 mg (20 mg Oral Given 02/10/15 1016)  losartan (COZAAR) tablet 100 mg (100 mg Oral Given 02/10/15 1016)  rivaroxaban (XARELTO) tablet 20 mg (20 mg Oral Given 02/10/15 1016)  flecainide (TAMBOCOR) tablet 100 mg (100 mg Oral Given 02/10/15 1016)  amLODipine (NORVASC) tablet 10 mg (10 mg Oral Given 02/10/15 1016)  pantoprazole (PROTONIX) EC tablet 40 mg (40 mg Oral Given 02/10/15 1016)  acetaminophen (TYLENOL) tablet 650 mg (650 mg Oral Given 02/10/15 1028)  ondansetron (ZOFRAN) injection 4 mg (not administered)  insulin aspart (novoLOG) injection 0-9 Units (1 Units Subcutaneous Given 02/10/15 1309)  sodium chloride 0.9 % injection 3 mL (3 mLs Intravenous Given 02/10/15 1000)  sodium chloride 0.9 % injection 3 mL (not administered)  0.9 %  sodium chloride infusion (not administered)  ipratropium-albuterol (DUONEB) 0.5-2.5 (3) MG/3ML nebulizer solution 3 mL (not administered)  methylPREDNISolone sodium succinate (SOLU-MEDROL) 125 mg/2 mL injection 60 mg (60 mg Intravenous Given 02/10/15 1310)  ipratropium-albuterol (DUONEB) 0.5-2.5 (3) MG/3ML nebulizer solution 3 mL (3 mLs Nebulization Given 02/10/15 1508)  zolpidem (AMBIEN) tablet 5 mg (5 mg Oral Given 02/10/15 0100)  amoxicillin-clavulanate (AUGMENTIN) 875-125 MG per tablet 1 tablet (1 tablet Oral Given 02/10/15 1309)  predniSONE (  DELTASONE) tablet 40 mg (40 mg Oral Given 02/09/15 1544)    Filed Vitals:   02/10/15 0350 02/10/15 0847 02/10/15 1316 02/10/15 1510  BP: 156/88  144/75   Pulse: 91  69   Temp: 98 F (36.7 C)  97.6 F (36.4 C)   TempSrc: Oral  Oral   Resp: 20  18   Height:      Weight: 250  lb 8 oz (113.626 kg)     SpO2: 97% 98% 99% 98%    Final diagnoses:  COPD exacerbation ([place])    Admission/ observation were discussed with the admitting physician, patient and/or family and they are comfortable with the plan.    Deno Etienne, DO 02/10/15 1523

## 2015-02-09 NOTE — ED Notes (Addendum)
Pt here for SOB and low 02 sats. sts ankle swelling. sts on home 02 2L. sts that she has been on lasix the past 3 days.

## 2015-02-09 NOTE — H&P (Signed)
[facility] History and Physical  Jordan Byrd [id number] DOB: November 12, 1933 DOA: 02/09/2015  Referring physician: ED physician PCP: [name], MD  Specialists:   Chief Complaint: Dyspnea, ankle swelling   HPI: Jordan Byrd is a 82 y.o. female with PMH of COPD on home oxygen around the clock, chronic diastolic CHF, and paroxysmal atrial fibrillation on's her relative who presents to the ED for evaluation of progressively worsening dyspnea. Patient had been in her usual state of health until approximately 3 days ago when she developed difficulty catching her breath. Despite her home uses supplemental oxygen, she was desaturating down into the low 80s on her usual 2 L. There was an associated dry cough, and reported bilateral ankle swelling, but no orthopnea, chest pains, or palpitations. Patient denies any recent change in her diet or fluid intake. She discussed her condition over the phone with her PCP and it was recommended that she increase her Lasix. She has been doubling her daily Lasix dose, but with no appreciable effect on her respiratory status. Home neb treatments have helped only slightly. Patient continues her daily Symbicort with albuterol MDI or neb as needed. She is been using 3 L/m supplemental oxygen for the last couple days.  In ED, patient was found to be afebrile, saturating well on 3 L/m nasal cannula, and with vital signs stable. Portable chest x-ray demonstrated clear lungs without edema or focal infiltrate. EKG demonstrated normal sinus rhythm, troponin was undetectable, and BNP came back at 41. Additional blood work was notable for a slight lump in her serum creatinine and mild hyperglycemia, but CBC was normal. Patient was given a DuoNeb treatment in the emergency department and while she did enjoy some subjective improvement, she desaturated to the low 80s on oxygen when ambulating. Patient was admitted to the hospital for ongoing evaluation and management of  pain increasing supplemental O2 requirement with dyspnea and cough.  Where does patient live?   At home     Can patient participate in ADLs?  Yes      Review of Systems:   General: no fevers, chills, sweats, weight change, poor appetite, or fatigue HEENT: no blurry vision, hearing changes or sore throat Pulm: Dyspnea, non-productive cough, wheeze CV: no chest pain or palpitations Abd: no nausea, vomiting, abdominal pain, diarrhea, or constipation GU: no dysuria, hematuria, increased urinary frequency, or urgency  Ext: bilateral ankle edema  Neuro: no focal weakness, numbness, or tingling, no vision change or hearing loss Skin: no rash, no wounds MSK: No muscle spasm, no deformity, no red, hot, or swollen joint Heme: No easy bruising or bleeding Travel history: No recent long distant travel    Allergy:  Allergies  Allergen Reactions  . Methocarbamol Hives  . Vicodin [Hydrocodone-Acetaminophen] Other (See Comments)    Interacts with bp medication and drops blood pressure  . Aspirin Nausea And Vomiting  . Butazolidin [Phenylbutazone] Other (See Comments)    Unknown reaction  . Celebrex [Celecoxib] Nausea And Vomiting  . Codeine Nausea And Vomiting  . Doripenem Rash  . Aspirin Buf(Alhyd-Mghyd-Cacar) Nausea And Vomiting  . Mucinex [Guaifenesin Er] Itching  . Temazepam Other (See Comments)    Unknown reaction    Past Medical History  Diagnosis Date  . Asthma   . Arthritis   . Hypertension   . Atrial fibrillation ([place])     a. 04/04/11  . Osteoarthritis     a. 03/28/11 - R Total Hip Arthroplasty  . Cataracts, both eyes   .  Pneumonia   . Bronchitis   . Pleurisy   . Ankle swelling   . Kidney infection   . Frequent urination   . Excessive urination at night   . Weight gain   . CHF (congestive heart failure) ([place])   . Shortness of breath     doe-  . Anemia   . Anxiety     pt denies this  . GERD (gastroesophageal reflux disease)     [name] Prilosec  . Chronic headaches      no longer having headaches  . Macular degeneration   . Breast cancer ([place]) 05/19/13    left breast stage IIA     Past Surgical History  Procedure Laterality Date  . Cardiac catheterization      1980's or 90's - reportedly normal  . Abdominal hysterectomy  1962  . Appendectomy  1954  . Cholecystectomy    . Back surgery  1990  . Cataract extraction bilateral w/ anterior vitrectomy  2007/2008  . Total hip arthroplasty  03/28/2011    Procedure: TOTAL HIP ARTHROPLASTY;  Surgeon: Yvette Rack., MD;  Location: [facility];  Service: Orthopedics;  Laterality: Right;  . Cardioversion  04/05/2011    Procedure: CARDIOVERSION;  Surgeon: Coralyn Mark, MD;  Location: [facility];  Service: Cardiovascular;  Laterality: N/A;  . Tonsillectomy  1943  . Adenoidectomy  1949  . Foot surgery  1947  . Thoracic disc surgery  2008  . Lumbar disc surgery  2010  . Knee surgery       both knees  . Shoulder surgery      left  . Total hip arthroplasty  03/28/2011    right  . Cardioversion N/A 06/19/2012    Procedure: CARDIOVERSION-bedside(3W36);  Surgeon: Lelon Perla, MD;  Location: [facility];  Service: Cardiovascular;  Laterality: N/A;  . Colonoscopy    . Breast lumpectomy with needle localization Left 07/14/2013    Procedure: BREAST LUMPECTOMY WITH NEEDLE LOCALIZATION;  Surgeon: Stark Klein, MD;  Location: [facility];  Service: General;  Laterality: Left;    Social History:  reports that she quit smoking about 44 years ago. Her smoking use included Cigarettes. She has a 21 pack-year smoking history. She has never used smokeless tobacco. She reports that she does not drink alcohol or use illicit drugs.  Family History:  Family History  Problem Relation Age of Onset  . Diabetes Father     Father, Mother, 4 sisters (2 living)  . Heart attack Father     (Deceased)  . Heart failure Father     (deceased 4)  . Hypertension Father   . Stroke Mother     (deceased 106)  . Hypertension Mother   . Cancer Sister 78     breast cancer  . Heart attack Sister   . Diabetes Sister   . Cancer Other 64    niece with breast cancer     Prior to Admission medications   Medication Sig Start Date End Date Taking? Authorizing Provider  acetaminophen (TYLENOL) 500 MG tablet Take 1,000 mg by mouth 2 (two) times daily.   Yes Historical Provider, MD  albuterol (PROVENTIL HFA;VENTOLIN HFA) 108 (90 BASE) MCG/ACT inhaler Inhale 1 puff into the lungs every 6 (six) hours as needed for wheezing.    Yes Historical Provider, MD  albuterol (PROVENTIL) (2.5 MG/3ML) 0.083% nebulizer solution Take 3 mLs (2.5 mg total) by nebulization every 6 (six) hours as needed for wheezing or shortness of breath. 05/15/14  Yes Nishant Dhungel, MD  amLODipine (NORVASC) 10 MG tablet Take 10 mg by mouth daily.   Yes Historical Provider, MD  budesonide-formoterol (SYMBICORT) 160-4.5 MCG/ACT inhaler Inhale 1 puff into the lungs See admin instructions. Inhale 1 puff every morning, may inhale a 2nd puff in the evening if needed for wheezing   Yes Historical Provider, MD  flecainide (TAMBOCOR) 100 MG tablet Take 1 tablet (100 mg total) by mouth every 12 (twelve) hours. 11/11/12  Yes Thompson Grayer, MD  furosemide (LASIX) 20 MG tablet Take 20-40 mg by mouth See admin instructions. Take 2 tablets (40 mg) by mouth every morning except take 1 tablet (20 mg) if going out and a 2nd tablet upon returning home   Yes Historical Provider, MD  IRON PO Take 5 mLs by mouth daily. Liquid Iron -   Yes Historical Provider, MD  losartan (COZAAR) 100 MG tablet Take 100 mg by mouth daily.   Yes Historical Provider, MD  Multiple Vitamin (MULTIVITAMIN WITH MINERALS) TABS tablet Take 1 tablet by mouth daily.   Yes Historical Provider, MD  Multiple Vitamins-Minerals (ICAPS PO) Take 1 tablet by mouth daily.   Yes Historical Provider, MD  Nebivolol HCl (BYSTOLIC) 20 MG TABS Take 20 mg by mouth daily.    Yes Historical Provider, MD  omeprazole (PRILOSEC) 20 MG capsule Take 20 mg by mouth  daily.   Yes Historical Provider, MD  rivaroxaban (XARELTO) 20 MG TABS tablet Take 1 tablet (20 mg total) by mouth daily. Patient taking differently: Take 20 mg by mouth daily with breakfast.  02/23/14  Yes Thompson Grayer, MD  potassium chloride (K-DUR,KLOR-CON) 10 MEQ tablet Take 1 tablet (10 mEq total) by mouth daily. Patient not taking: Reported on 02/09/2015 05/27/14   Janece Canterbury, MD    Physical Exam: Filed Vitals:   02/09/15 1715 02/09/15 1745 02/09/15 1815 02/09/15 1914  BP: 151/66 132/58 153/72 145/73  Pulse: 68 77 74 75  Temp:    97.9 F (36.6 C)  TempSrc:    Oral  Resp: 16 23 25 22   Height:    5\' 6"  (1.676 m)  Weight:    113.445 kg (250 lb 1.6 oz)  SpO2: 99% 98% 97% 96%   General: Not in acute distress HEENT:       Eyes: PERRL, EOMI, no scleral icterus or conjunctival pallor.       ENT: No discharge from the ears or nose, no pharyngeal ulcers, petechiae or exudate, no tonsillar enlargement.        Neck: No JVD, no bruit, no appreciable mass Heme: No cervical adenopathy, no pallor Cardiac: Rate ~60 and regular, no significant murmur, No gallops or rubs. Pulm: Symmetric chest expansion, unlabored at rest, using accessory muscles and gasping after speaking, b/l air movement diminished, no wheezing. Abd: Soft, nondistended, nontender, no rebound pain or gaurding, no mass or organomegaly, BS present. Ext: Trace b/l LE edema. 2+DP/PT pulse bilaterally. Musculoskeletal: No gross deformity, no red, hot, swollen joints  Skin: No rashes or wounds on exposed surfaces  Neuro: Alert, oriented X3, cranial nerves II-XII grossly intact.  No focal findings Psych: Patient is not overtly psychotic, mood and affect appropriate .  Labs on Admission:  Basic Metabolic Panel:  Recent Labs Lab 02/09/15 1342  NA 144  K 4.0  CL 102  CO2 28  GLUCOSE 128*  BUN 31*  CREATININE 1.45*  CALCIUM 9.6   Liver Function Tests: No results for input(s): AST, ALT, ALKPHOS, BILITOT, PROT, ALBUMIN in  the  last 168 hours. No results for input(s): LIPASE, AMYLASE in the last 168 hours. No results for input(s): AMMONIA in the last 168 hours. CBC:  Recent Labs Lab 02/09/15 1342  WBC 7.8  HGB 12.8  HCT 41.7  MCV 97.2  PLT 258   Cardiac Enzymes: No results for input(s): CKTOTAL, CKMB, CKMBINDEX, TROPONINI in the last 168 hours.  BNP (last 3 results)  Recent Labs  05/13/14 0955 05/23/14 1252 02/09/15 1343  BNP 168.6* 40.0 41.2    ProBNP (last 3 results) No results for input(s): PROBNP in the last 8760 hours.  CBG: No results for input(s): GLUCAP in the last 168 hours.  Radiological Exams on Admission: Dg Chest 2 View  02/09/2015  CLINICAL DATA:  Shortness of breath and wheezing for 1 week. On diuresis for fluid on lungs, per physician. History of COPD, CHF, hypertension, atrial fibrillation. EXAM: CHEST  2 VIEW COMPARISON:  Chest x-rays dated 05/23/2014 and 08/15/2013. FINDINGS: Heart size is upper normal, stable. Overall cardiomediastinal silhouette remains normal in size and configuration. Mild atherosclerotic changes again noted at the aortic arch. Lungs are clear. No evidence of pneumonia. No pleural effusions seen. No evidence of active congestive heart failure or pulmonary edema. No pneumothorax seen. Mild degenerative changes noted at the shoulders. Anterior cervical fusion hardware again noted within the lower cervical spine. Surgical clips again noted over the left lower chest wall. No acute osseous or soft tissue abnormality seen. IMPRESSION: Lungs are clear and there is no evidence of acute cardiopulmonary abnormality. No evidence of pneumonia. No evidence of congestive heart failure. Electronically Signed   By: Franki Cabot M.D.   On: 02/09/2015 14:22    EKG: Independently reviewed.  Abnormal findings:     NSR, new left posterior fascicular block not seen in April '16   Assessment/Plan  1. COPD with chronic respiratory failure and acute exacerbation  - Titrating  supplemental O2 to maintain sats 90-92% range  - DuoNebs scheduled q6h, available q2h prn  - Solu-Medrol 60 mg IV q6h, transition to oral as condition allows  - Continue Symbicort  - RT has been consulted  - Continuous pulse ox, telemetry   2. Chronic diastolic CHF  - TTE on 99991111 demonstrates a preserved EF  - Pt appears euvolemic with only trace pedal edema and clear lungs  - Will continue home-dose Lasix 20 mg BID  - SLIV, strict I/O, daily wts  - Continue nebivolol, losartan  - Monitor on telemetry   3. Hypertension  - At goal so far here  - Will continue home Lasix, nebivolol, losartan, Norvasc  - Monitor and adjust regimen prn   4. Paroxysmal a fib  - Has been in sinus here  - Continue Xarelto, flecanide  - Telemonitor, EKG for any arrhythmias on monitor    5. CKD III - SCr 1.45 on admission, up from apparent baseline of 1.2 or 1.3  - Small bump likely d/t recently increased Lasix dose as pt thought dyspnea was CHF-related  - Euvolemic currently, will SLIV and go back to prior home-dose Lasix of 20 mg BID  - Repeat chemistries tomorrow   - Avoid nephrotoxins as possible   6. Hyperglycemia  - No diagnosis of DM  - Mild elevation in serum glucose on admit  - Will check CBGs as she will be on systemic steroids for COPD  - Start with sensitive SSI if sugars persist high, titrate as needed    DVT ppx: On Xarelto   Code Status: Full  code Family Communication: None at bed side.         Disposition Plan: Admit to inpatient   Date of Service 02/09/2015    Vianne Bulls, MD [facility] Pager [phone]  If 7PM-7AM, please contact night-coverage [email or web address] Password [email or web address] 02/09/2015, 7:59 PM

## 2015-02-09 NOTE — Progress Notes (Signed)
If asleep at 0200, pt does not want treatment.  However, if the pt is up then she will take the treatment.

## 2015-02-09 NOTE — ED Notes (Signed)
Ambulated patient in hallway with moderate assistance while checking pulse oximetry. Patient has been short of breath prior to ambulation and is on home oxygen therapy of 2 liters via nasal cannula. During ambulation, patient states that she is felling very "short-winded" but denies any dizziness or lightheadedness. Oxygen levels did drop in the mid 80's and pulse rate increased to high 80's. No other complaints at this time, however breathing sounded very labored at this time

## 2015-02-10 DIAGNOSIS — J9611 Chronic respiratory failure with hypoxia: Secondary | ICD-10-CM | POA: Diagnosis not present

## 2015-02-10 DIAGNOSIS — N183 Chronic kidney disease, stage 3 (moderate): Secondary | ICD-10-CM | POA: Diagnosis not present

## 2015-02-10 DIAGNOSIS — J441 Chronic obstructive pulmonary disease with (acute) exacerbation: Secondary | ICD-10-CM | POA: Diagnosis not present

## 2015-02-10 DIAGNOSIS — I5032 Chronic diastolic (congestive) heart failure: Secondary | ICD-10-CM | POA: Diagnosis not present

## 2015-02-10 LAB — CBC
HCT: 40 % (ref 36.0–46.0)
Hemoglobin: 12.4 g/dL (ref 12.0–15.0)
MCH: 29.5 pg (ref 26.0–34.0)
MCHC: 31 g/dL (ref 30.0–36.0)
MCV: 95.2 fL (ref 78.0–100.0)
PLATELETS: 260 10*3/uL (ref 150–400)
RBC: 4.2 MIL/uL (ref 3.87–5.11)
RDW: 14.1 % (ref 11.5–15.5)
WBC: 7.7 10*3/uL (ref 4.0–10.5)

## 2015-02-10 LAB — GLUCOSE, CAPILLARY
GLUCOSE-CAPILLARY: 150 mg/dL — AB (ref 65–99)
GLUCOSE-CAPILLARY: 157 mg/dL — AB (ref 65–99)
Glucose-Capillary: 133 mg/dL — ABNORMAL HIGH (ref 65–99)
Glucose-Capillary: 139 mg/dL — ABNORMAL HIGH (ref 65–99)

## 2015-02-10 LAB — BASIC METABOLIC PANEL
Anion gap: 11 (ref 5–15)
BUN: 35 mg/dL — ABNORMAL HIGH (ref 6–20)
CHLORIDE: 100 mmol/L — AB (ref 101–111)
CO2: 29 mmol/L (ref 22–32)
CREATININE: 1.51 mg/dL — AB (ref 0.44–1.00)
Calcium: 9.3 mg/dL (ref 8.9–10.3)
GFR, EST AFRICAN AMERICAN: 37 mL/min — AB (ref >60)
GFR, EST NON AFRICAN AMERICAN: 32 mL/min — AB (ref >60)
Glucose, Bld: 136 mg/dL — ABNORMAL HIGH (ref 65–99)
POTASSIUM: 4.6 mmol/L (ref 3.5–5.1)
SODIUM: 140 mmol/L (ref 135–145)

## 2015-02-10 MED ORDER — ZOLPIDEM TARTRATE 5 MG PO TABS
5.0000 mg | ORAL_TABLET | Freq: Every evening | ORAL | Status: DC | PRN
  Administered 2015-02-10 – 2015-02-14 (×4): 5 mg via ORAL
  Filled 2015-02-10 (×5): qty 1

## 2015-02-10 MED ORDER — AZITHROMYCIN 500 MG PO TABS: 500.0000 mg | ORAL_TABLET | Freq: Every day | ORAL | Status: DC

## 2015-02-10 MED ORDER — IPRATROPIUM-ALBUTEROL 0.5-2.5 (3) MG/3ML IN SOLN
3.0000 mL | Freq: Three times a day (TID) | RESPIRATORY_TRACT | Status: DC
  Administered 2015-02-11 – 2015-02-12 (×4): 3 mL via RESPIRATORY_TRACT
  Filled 2015-02-10 (×4): qty 3

## 2015-02-10 MED ORDER — AMOXICILLIN-POT CLAVULANATE 875-125 MG PO TABS
1.0000 | ORAL_TABLET | Freq: Two times a day (BID) | ORAL | Status: DC
  Administered 2015-02-10 – 2015-02-11 (×4): 1 via ORAL
  Filled 2015-02-10 (×4): qty 1

## 2015-02-10 NOTE — Evaluation (Signed)
Physical Therapy Evaluation Patient Details Name: Jordan Byrd MRN: [id number] DOB: 07-21-1933 Today's Date: 02/10/2015   History of Present Illness  pt was admitted to the [facility] 02/09/2015 beacuse "i lost my air"  she had a lot of wheezing.  She has oxygen at home at 2L all the time   Clinical Impression  Jordan Byrd says that she is limited in all her activities by dyspnea and that she doesn't do much at home.  She is able to get to the edge of the bed independently and walk with a rolling walker and supplemental O2 but is limited by severy dyspnea after only 20 feet. O2 sats stayed at 97%.  We talked about her doing small amounts of activity many times throughout the day ( ankle pumps, glute sets, arm raises) to maintain her musclular strength.  Will not follow her here for skilled PT as she cannot tolerate much at one time.  Recommend that she intiate mobility often and ask nursing to get O2 tank so that she can walk more in the hallway.     Follow Up Recommendations No PT follow up    Equipment Recommendations  None recommended by PT    Recommendations for Other Services       Precautions / Restrictions Precautions Precautions: Fall Restrictions Weight Bearing Restrictions: No      Mobility  Bed Mobility Overal bed mobility: Independent                Transfers Overall transfer level: Independent Equipment used: Rolling walker (2 wheeled)                Ambulation/Gait Ambulation/Gait assistance: Supervision Ambulation Distance (Feet): 20 Feet Assistive device: Rolling walker (2 wheeled) (2L O2) Gait Pattern/deviations: Step-through pattern;Decreased step length - right;Decreased step length - left;Trunk flexed Gait velocity: slow   General Gait Details: pt very dyspneic with ambulation , wheezing audible. O2 sats 97% on 2L   Stairs            Wheelchair Mobility    Modified Rankin (Stroke Patients Only)       Balance   Sitting-balance  support: No upper extremity supported Sitting balance-Leahy Scale: Normal     Standing balance support: Single extremity supported Standing balance-Leahy Scale: Good Standing balance comment: limited by dyspnea                             Pertinent Vitals/Pain      Home Living Family/patient expects to be discharged to:: Private residence Living Arrangements: Alone Available Help at Discharge: Available PRN/intermittently (grandson lives 2 doors down) Type of Home: House Home Access: Ramped entrance     Home Layout: One level Home Equipment: Environmental consultant - 2 wheels;Walker - 4 wheels;Walker - standard      Prior Function Level of Independence: Independent               Hand Dominance        Extremity/Trunk Assessment               Lower Extremity Assessment: Generalized weakness      Cervical / Trunk Assessment:  (forward head, obese abdomen)  Communication   Communication: No difficulties  Cognition Arousal/Alertness: Awake/alert Behavior During Therapy: WFL for tasks assessed/performed                        General Comments General comments (skin integrity, edema,  etc.): pt reports she is limited in activity at home by her dyspnea     Exercises        Assessment/Plan    PT Assessment Patent does not need any further PT services  PT Diagnosis Other (comment) (cardiorespiratory status limiting mobility )   PT Problem List    PT Treatment Interventions     PT Goals (Current goals can be found in the Care Plan section) Acute Rehab PT Goals Patient Stated Goal: to go home.  She does not want PT follow up.  PT Goal Formulation: With patient    Frequency     Barriers to discharge        Co-evaluation               End of Session Equipment Utilized During Treatment: Oxygen Activity Tolerance: Other (comment) (limited by dyspnea) Patient left: with nursing/sitter in room Nurse Communication: Other (comment) (need to  watch for dyspnea )         Time: [id number] PT Time Calculation (min) (ACUTE ONLY): 25 min   Charges:   PT Evaluation $PT Eval Low Complexity: 1 Procedure     PT G Codes:       Shia Delaine K. Owens Shark, PT   Norwood Levo 02/10/2015, 11:23 AM

## 2015-02-10 NOTE — Progress Notes (Signed)
Nutrition Note  Received nutrition consult per the COPD Gold Protocol.   Wt Readings from Last 15 Encounters:  02/10/15 250 lb 8 oz (113.626 kg)  12/04/14 255 lb (115.667 kg)  06/14/14 259 lb 3.2 oz (117.572 kg)  05/27/14 249 lb (112.946 kg)  05/23/14 244 lb 6.4 oz (110.859 kg)  05/15/14 256 lb 6.3 oz (116.3 kg)  05/10/14 258 lb (117.028 kg)  04/04/14 259 lb (117.482 kg)  03/07/14 226 lb (102.513 kg)  10/24/13 246 lb 6.4 oz (111.766 kg)  10/06/13 247 lb 1.6 oz (112.084 kg)  07/29/13 243 lb 9.6 oz (110.496 kg)  07/28/13 246 lb 9.6 oz (111.857 kg)  07/14/13 240 lb (108.863 kg)  07/11/13 240 lb 1.9 oz (108.918 kg)    Body mass index is 40.45 kg/(m^2). Patient meets criteria for class 3, extreme/morbid obesity based on current BMI.   Patient with stable weight trend. Current diet order is heart healthy, patient is consuming approximately 100% of meals at this time. Labs and medications reviewed.   No nutrition interventions warranted at this time. If nutrition issues arise, please consult RD.   Molli Barrows, RD, LDN, [facility] Pager [phone] After Hours Pager [phone]

## 2015-02-10 NOTE — Progress Notes (Signed)
[facility]                                                                              Patient Demographics  Jordan Byrd, is a 82 y.o. female, DOB - 02/23/1933, [id number]  Admit date - 02/09/2015   Admitting Physician Vianne Bulls, MD  Outpatient Primary MD for the patient is GREEN, Keenan Bachelor, MD  LOS - 1   Chief Complaint  Patient presents with  . Shortness of Breath       Brief HPI   Jordan Byrd is a 82 y.o. female with PMH of COPD on home O2, chronic diastolic CHF, and paroxysmal Afib presented to ED with progressively worsening dyspnea for last 3 days. O2 sats below 80% on her usual 2 L at home, hence using 3 L at home. With associated dry cough, bilateral ankle swelling, no orthopnea chest pain or palpitations. She discussed her condition over the phone with her PCP and it was recommended that she increase her Lasix. She has been doubling her daily Lasix dose, but with no appreciable effect on her respiratory status. Home neb treatments have helped only slightly.  In ED, vital signs stable, chest x-ray showed clear lungs with no edema or focal infiltrates. BNP came back at 41. Additional blood work was notable for a slight lump in her serum creatinine and mild hyperglycemia, but CBC was normal.   Assessment & Plan     Acute on chronic respiratory failure with acute COPD exacerbation.  - Continue scheduled to an abscess, IV Solu-Medrol, Symbicort,  - Placed on Zithromax   Chronic diastolic CHF  - Appears euvolemic.  - TTE on 07/14/14 demonstrates a preserved EF, BNP low, trace edema, clear lungs,  - Will continue home-dose Lasix 20 mg BID , strict I's and O's and daily weights - Continue nebivolol, losartan    Hypertension  - Will continue home Lasix, nebivolol, losartan, Norvasc    Paroxysmal a fib  -Currently in normal sinus rhythm - Continue Xarelto, flecanide  - CHADS vasc 3-4  CKD III - SCr 1.45 on admission, baseline of  1.2 - 1.3  - Small bump likely due to recently increased Lasix dose as pt thought dyspnea was CHF-related  - Euvolemic currently, continue home-dose Lasix of 20 mg BID     Hyperglycemia  - No diagnosis of DM  - Follow CBGs, patient is on steroids  Code Status: Full code  Family Communication: Discussed in detail with the patient, all imaging results, lab results explained to the patient    Disposition Plan:   Time Spent in minutes   25 minutes  Procedures  None   Consults   none  DVT Prophylaxis  xarelto  Medications  Scheduled Meds: . amLODipine  10 mg Oral Daily  . flecainide  100 mg Oral Q12H  . furosemide  20 mg Oral BID  . insulin aspart  0-9 Units Subcutaneous TID WC  . ipratropium-albuterol  3 mL Nebulization Q6H  . losartan  100 mg Oral Daily  . methylPREDNISolone (SOLU-MEDROL) injection  60 mg Intravenous Q6H  . multivitamin with minerals  1 tablet Oral  Daily  . nebivolol  20 mg Oral Daily  . pantoprazole  40 mg Oral Daily  . rivaroxaban  20 mg Oral Daily  . sodium chloride  3 mL Intravenous Q12H   Continuous Infusions:  PRN Meds:.sodium chloride, acetaminophen, ipratropium-albuterol, ondansetron (ZOFRAN) IV, sodium chloride, zolpidem   Antibiotics   Anti-infectives    None        Subjective:   Jordan Byrd was seen and examined today. Feeling somewhat better today, shortness of breath is improving. No wheezing at the time of encounter. No fevers or chills. Patient denies dizziness, chest pain, abdominal pain, N/V/D/C, new weakness, numbess, tingling. No acute events overnight.    Objective:   Blood pressure 156/88, pulse 91, temperature 98 F (36.7 C), temperature source Oral, resp. rate 20, height 5\' 6"  (1.676 m), weight 113.626 kg (250 lb 8 oz), SpO2 98 %.  Wt Readings from Last 3 Encounters:  02/10/15 113.626 kg (250 lb 8 oz)  12/04/14 115.667 kg (255 lb)  06/14/14 117.572 kg (259 lb 3.2 oz)     Intake/Output Summary (Last 24  hours) at 02/10/15 1141 Last data filed at 02/10/15 1125  Gross per 24 hour  Intake    600 ml  Output   1050 ml  Net   -450 ml    Exam  General: Alert and oriented x 3, NAD  HEENT:  PERRLA, EOMI, Anicteric Sclera, mucous membranes moist.   Neck: Supple, no JVD, no masses  CVS: S1 S2 auscultated, no rubs, murmurs or gallops. Regular rate and rhythm.  Respiratory: Decreased breath sounds at the bases  Abdomen: Soft, nontender, nondistended, + bowel sounds  Ext: no cyanosis clubbing or edema  Neuro: AAOx3, Cr N's II- XII. Strength 5/5 upper and lower extremities bilaterally  Skin: No rashes  Psych: Normal affect and demeanor, alert and oriented x3    Data Review   Micro Results No results found for this or any previous visit (from the past 240 hour(s)).  Radiology Reports Dg Chest 2 View  02/09/2015  CLINICAL DATA:  Shortness of breath and wheezing for 1 week. On diuresis for fluid on lungs, per physician. History of COPD, CHF, hypertension, atrial fibrillation. EXAM: CHEST  2 VIEW COMPARISON:  Chest x-rays dated 05/23/2014 and 08/15/2013. FINDINGS: Heart size is upper normal, stable. Overall cardiomediastinal silhouette remains normal in size and configuration. Mild atherosclerotic changes again noted at the aortic arch. Lungs are clear. No evidence of pneumonia. No pleural effusions seen. No evidence of active congestive heart failure or pulmonary edema. No pneumothorax seen. Mild degenerative changes noted at the shoulders. Anterior cervical fusion hardware again noted within the lower cervical spine. Surgical clips again noted over the left lower chest wall. No acute osseous or soft tissue abnormality seen. IMPRESSION: Lungs are clear and there is no evidence of acute cardiopulmonary abnormality. No evidence of pneumonia. No evidence of congestive heart failure. Electronically Signed   By: Franki Cabot M.D.   On: 02/09/2015 14:22    CBC  Recent Labs Lab 02/09/15 1342  02/10/15 0347  WBC 7.8 7.7  HGB 12.8 12.4  HCT 41.7 40.0  PLT 258 260  MCV 97.2 95.2  MCH 29.8 29.5  MCHC 30.7 31.0  RDW 14.2 14.1    Chemistries   Recent Labs Lab 02/09/15 1342 02/10/15 0347  NA 144 140  K 4.0 4.6  CL 102 100*  CO2 28 29  GLUCOSE 128* 136*  BUN 31* 35*  CREATININE 1.45* 1.51*  CALCIUM 9.6  9.3   ------------------------------------------------------------------------------------------------------------------ estimated creatinine clearance is 38.6 mL/min (by C-G formula based on Cr of 1.51). ------------------------------------------------------------------------------------------------------------------ No results for input(s): HGBA1C in the last 72 hours. ------------------------------------------------------------------------------------------------------------------ No results for input(s): CHOL, HDL, LDLCALC, TRIG, CHOLHDL, LDLDIRECT in the last 72 hours. ------------------------------------------------------------------------------------------------------------------ No results for input(s): TSH, T4TOTAL, T3FREE, THYROIDAB in the last 72 hours.  Invalid input(s): FREET3 ------------------------------------------------------------------------------------------------------------------ No results for input(s): VITAMINB12, FOLATE, FERRITIN, TIBC, IRON, RETICCTPCT in the last 72 hours.  Coagulation profile No results for input(s): INR, PROTIME in the last 168 hours.  No results for input(s): DDIMER in the last 72 hours.  Cardiac Enzymes No results for input(s): CKMB, TROPONINI, MYOGLOBIN in the last 168 hours.  Invalid input(s): CK ------------------------------------------------------------------------------------------------------------------ Invalid input(s): [place]  02/09/15 2227 02/10/15 0632 02/10/15 1113  GLUCAP 136* 133* 150*     Burnham Trost M.D. [facility] 02/10/2015, 11:41 AM  Pager: [phone] Between 7am  to 7pm - call Pager - [phone]  After 7pm go to [email or web address] - password [email or web address]  Call night coverage person covering after 7pm

## 2015-02-10 NOTE — Progress Notes (Signed)
Patient requested sleep aid. Notified on-call hospitalist. Awaiting return page and or orders.

## 2015-02-11 ENCOUNTER — Inpatient Hospital Stay (HOSPITAL_COMMUNITY): Payer: PPO

## 2015-02-11 DIAGNOSIS — J441 Chronic obstructive pulmonary disease with (acute) exacerbation: Secondary | ICD-10-CM | POA: Diagnosis not present

## 2015-02-11 DIAGNOSIS — M25512 Pain in left shoulder: Secondary | ICD-10-CM | POA: Diagnosis not present

## 2015-02-11 DIAGNOSIS — N183 Chronic kidney disease, stage 3 (moderate): Secondary | ICD-10-CM | POA: Diagnosis not present

## 2015-02-11 DIAGNOSIS — J9611 Chronic respiratory failure with hypoxia: Secondary | ICD-10-CM | POA: Diagnosis not present

## 2015-02-11 DIAGNOSIS — I5032 Chronic diastolic (congestive) heart failure: Secondary | ICD-10-CM | POA: Diagnosis not present

## 2015-02-11 LAB — BASIC METABOLIC PANEL WITH GFR
Anion gap: 12 (ref 5–15)
BUN: 48 mg/dL — ABNORMAL HIGH (ref 6–20)
CO2: 26 mmol/L (ref 22–32)
Calcium: 9 mg/dL (ref 8.9–10.3)
Chloride: 99 mmol/L — ABNORMAL LOW (ref 101–111)
Creatinine, Ser: 1.66 mg/dL — ABNORMAL HIGH (ref 0.44–1.00)
GFR calc Af Amer: 33 mL/min — ABNORMAL LOW (ref >60)
GFR calc non Af Amer: 28 mL/min — ABNORMAL LOW (ref >60)
Glucose, Bld: 135 mg/dL — ABNORMAL HIGH (ref 65–99)
Potassium: 4.3 mmol/L (ref 3.5–5.1)
Sodium: 137 mmol/L (ref 135–145)

## 2015-02-11 LAB — CBC
HCT: 38.2 % (ref 36.0–46.0)
Hemoglobin: 12.1 g/dL (ref 12.0–15.0)
MCH: 30.3 pg (ref 26.0–34.0)
MCHC: 31.7 g/dL (ref 30.0–36.0)
MCV: 95.5 fL (ref 78.0–100.0)
Platelets: 260 K/uL (ref 150–400)
RBC: 4 MIL/uL (ref 3.87–5.11)
RDW: 14.2 % (ref 11.5–15.5)
WBC: 8.4 K/uL (ref 4.0–10.5)

## 2015-02-11 LAB — GLUCOSE, CAPILLARY
GLUCOSE-CAPILLARY: 124 mg/dL — AB (ref 65–99)
Glucose-Capillary: 125 mg/dL — ABNORMAL HIGH (ref 65–99)
Glucose-Capillary: 144 mg/dL — ABNORMAL HIGH (ref 65–99)
Glucose-Capillary: 145 mg/dL — ABNORMAL HIGH (ref 65–99)

## 2015-02-11 MED ORDER — BENZONATATE 100 MG PO CAPS
200.0000 mg | ORAL_CAPSULE | Freq: Three times a day (TID) | ORAL | Status: DC
  Administered 2015-02-11 – 2015-02-15 (×13): 200 mg via ORAL
  Filled 2015-02-11 (×13): qty 2

## 2015-02-11 MED ORDER — MENTHOL 3 MG MT LOZG
1.0000 | LOZENGE | OROMUCOSAL | Status: DC | PRN
  Filled 2015-02-11: qty 9

## 2015-02-11 NOTE — Progress Notes (Signed)
[facility]                                                                              Patient Demographics  Jordan Byrd, is a 82 y.o. female, DOB - 01/09/1934, [id number]  Admit date - 02/09/2015   Admitting Physician Vianne Bulls, MD  Outpatient Primary MD for the patient is GREEN, Keenan Bachelor, MD  LOS - 2   Chief Complaint  Patient presents with  . Shortness of Breath       Brief HPI   Jordan Byrd is a 82 y.o. female with PMH of COPD on home O2, chronic diastolic CHF, and paroxysmal Afib presented to ED with progressively worsening dyspnea for last 3 days. O2 sats below 80% on her usual 2 L at home, hence using 3 L at home. With associated dry cough, bilateral ankle swelling, no orthopnea chest pain or palpitations. She discussed her condition over the phone with her PCP and it was recommended that she increase her Lasix. She has been doubling her daily Lasix dose, but with no appreciable effect on her respiratory status. Home neb treatments have helped only slightly.  In ED, vital signs stable, chest x-ray showed clear lungs with no edema or focal infiltrates. BNP came back at 41. Additional blood work was notable for a slight lump in her serum creatinine and mild hyperglycemia, but CBC was normal.   Assessment & Plan     Acute on chronic respiratory failure with acute COPD exacerbation.  - Continue scheduled Nebs,  - Continue IV Solu-Medrol, will taper tomorrow - Continue augmentin - added tesselon, cepacol   Chronic diastolic CHF  - Appears euvolemic.  - TTE on 07/14/14 demonstrates a preserved EF, BNP low, trace edema, clear lungs,  -hold home-dose Lasix 20 mg BID  - continue strict I's and O's and daily weights - Continue nebivolol, hold losartan  - Follow cr in am, trending up today   Hypertension  - Will continue home nebivolol, Norvasc    Paroxysmal a fib  -Currently in normal sinus rhythm - Continue Xarelto, flecanide  -  CHADS vasc 3-4  Mild AKI on CKD III - SCr 1.45 on admission, baseline of 1.2 - 1.3  - Small bump likely due to recently increased Lasix dose as pt thought dyspnea was CHF-related  - Euvolemic currently, hold lasix     Hyperglycemia  - No diagnosis of DM  - Follow CBGs, patient is on steroids  Acute on Chronic left shoulder pain: has history of rotator cuff tear, has surgeries in the past. - Left shoulder xray  Code Status: Full code  Family Communication: Discussed in detail with the patient, all imaging results, lab results explained to the patient    Disposition Plan:   Time Spent in minutes   25 minutes  Procedures  None   Consults   none  DVT Prophylaxis  xarelto  Medications  Scheduled Meds: . amLODipine  10 mg Oral Daily  . amoxicillin-clavulanate  1 tablet Oral Q12H  . benzonatate  200 mg Oral TID  . flecainide  100 mg Oral Q12H  . insulin aspart  0-9 Units Subcutaneous TID WC  . ipratropium-albuterol  3 mL Nebulization TID  . losartan  100 mg Oral Daily  . methylPREDNISolone (SOLU-MEDROL) injection  60 mg Intravenous Q6H  . multivitamin with minerals  1 tablet Oral Daily  . nebivolol  20 mg Oral Daily  . pantoprazole  40 mg Oral Daily  . rivaroxaban  20 mg Oral Daily  . sodium chloride  3 mL Intravenous Q12H   Continuous Infusions:  PRN Meds:.sodium chloride, acetaminophen, ipratropium-albuterol, menthol-cetylpyridinium, ondansetron (ZOFRAN) IV, sodium chloride, zolpidem   Antibiotics   Anti-infectives    Start     Dose/Rate Route Frequency Ordered Stop   02/10/15 1200  azithromycin (ZITHROMAX) tablet 500 mg  Status:  Discontinued     500 mg Oral Daily 02/10/15 1146 02/10/15 1155   02/10/15 1200  amoxicillin-clavulanate (AUGMENTIN) 875-125 MG per tablet 1 tablet     1 tablet Oral Every 12 hours 02/10/15 1158          Subjective:   Jordan Byrd was seen and examined today. Breathing better but pain in left shoulder bothering today.  Shortness of breath is improving, slight wheezing. No fevers or chills. Patient denies dizziness, chest pain, abdominal pain, N/V/D/C, new weakness, numbess, tingling. No acute events overnight.    Objective:   Blood pressure 145/88, pulse 78, temperature 97.6 F (36.4 C), temperature source Oral, resp. rate 18, height 5\' 6"  (1.676 m), weight 113.762 kg (250 lb 12.8 oz), SpO2 97 %.  Wt Readings from Last 3 Encounters:  02/11/15 113.762 kg (250 lb 12.8 oz)  12/04/14 115.667 kg (255 lb)  06/14/14 117.572 kg (259 lb 3.2 oz)     Intake/Output Summary (Last 24 hours) at 02/11/15 1328 Last data filed at 02/11/15 1049  Gross per 24 hour  Intake   1060 ml  Output   1225 ml  Net   -165 ml    Exam  General: Alert and oriented x 3, NAD  HEENT:  PERRLA, EOMI, Anicteric Sclera, mucous membranes moist.   Neck: Supple, no JVD, no masses  CVS: S1 S2 auscultated, no rubs, murmurs or gallops. Regular rate and rhythm.  Respiratory: Decreased breath sounds at the bases  Abdomen: Soft, nontender, nondistended, + bowel sounds  Ext: no cyanosis clubbing or edema, left shoulder chronic pain and weakness  Neuro: no new deficits  Skin: No rashes  Psych: Normal affect and demeanor, alert and oriented x3    Data Review   Micro Results No results found for this or any previous visit (from the past 240 hour(s)).  Radiology Reports Dg Chest 2 View  02/09/2015  CLINICAL DATA:  Shortness of breath and wheezing for 1 week. On diuresis for fluid on lungs, per physician. History of COPD, CHF, hypertension, atrial fibrillation. EXAM: CHEST  2 VIEW COMPARISON:  Chest x-rays dated 05/23/2014 and 08/15/2013. FINDINGS: Heart size is upper normal, stable. Overall cardiomediastinal silhouette remains normal in size and configuration. Mild atherosclerotic changes again noted at the aortic arch. Lungs are clear. No evidence of pneumonia. No pleural effusions seen. No evidence of active congestive heart failure  or pulmonary edema. No pneumothorax seen. Mild degenerative changes noted at the shoulders. Anterior cervical fusion hardware again noted within the lower cervical spine. Surgical clips again noted over the left lower chest wall. No acute osseous or soft tissue abnormality seen. IMPRESSION: Lungs are clear and there is no evidence of acute cardiopulmonary abnormality. No evidence of pneumonia. No evidence of congestive heart failure. Electronically Signed  By: Franki Cabot M.D.   On: 02/09/2015 14:22    CBC  Recent Labs Lab 02/09/15 1342 02/10/15 0347 02/11/15 0230  WBC 7.8 7.7 8.4  HGB 12.8 12.4 12.1  HCT 41.7 40.0 38.2  PLT 258 260 260  MCV 97.2 95.2 95.5  MCH 29.8 29.5 30.3  MCHC 30.7 31.0 31.7  RDW 14.2 14.1 14.2    Chemistries   Recent Labs Lab 02/09/15 1342 02/10/15 0347 02/11/15 0230  NA 144 140 137  K 4.0 4.6 4.3  CL 102 100* 99*  CO2 28 29 26   GLUCOSE 128* 136* 135*  BUN 31* 35* 48*  CREATININE 1.45* 1.51* 1.66*  CALCIUM 9.6 9.3 9.0   ------------------------------------------------------------------------------------------------------------------ estimated creatinine clearance is 35.2 mL/min (by C-G formula based on Cr of 1.66). ------------------------------------------------------------------------------------------------------------------ No results for input(s): HGBA1C in the last 72 hours. ------------------------------------------------------------------------------------------------------------------ No results for input(s): CHOL, HDL, LDLCALC, TRIG, CHOLHDL, LDLDIRECT in the last 72 hours. ------------------------------------------------------------------------------------------------------------------ No results for input(s): TSH, T4TOTAL, T3FREE, THYROIDAB in the last 72 hours.  Invalid input(s): FREET3 ------------------------------------------------------------------------------------------------------------------ No results for input(s):  VITAMINB12, FOLATE, FERRITIN, TIBC, IRON, RETICCTPCT in the last 72 hours.  Coagulation profile No results for input(s): INR, PROTIME in the last 168 hours.  No results for input(s): DDIMER in the last 72 hours.  Cardiac Enzymes No results for input(s): CKMB, TROPONINI, MYOGLOBIN in the last 168 hours.  Invalid input(s): CK ------------------------------------------------------------------------------------------------------------------ Invalid input(s): [place]  02/10/15 0632 02/10/15 1113 02/10/15 1605 02/10/15 2048 02/11/15 0637 02/11/15 1137  GLUCAP 133* 150* 157* 139* 125* 57*     RAI,RIPUDEEP M.D. [facility] 02/11/2015, 1:28 PM  Pager: [phone] Between 7am to 7pm - call Pager - [phone]  After 7pm go to [email or web address] - password [email or web address]  Call night coverage person covering after 7pm

## 2015-02-12 ENCOUNTER — Inpatient Hospital Stay (HOSPITAL_COMMUNITY): Payer: PPO

## 2015-02-12 DIAGNOSIS — M75112 Incomplete rotator cuff tear or rupture of left shoulder, not specified as traumatic: Secondary | ICD-10-CM | POA: Diagnosis not present

## 2015-02-12 DIAGNOSIS — J441 Chronic obstructive pulmonary disease with (acute) exacerbation: Secondary | ICD-10-CM | POA: Diagnosis not present

## 2015-02-12 DIAGNOSIS — J9611 Chronic respiratory failure with hypoxia: Secondary | ICD-10-CM | POA: Diagnosis not present

## 2015-02-12 DIAGNOSIS — N183 Chronic kidney disease, stage 3 (moderate): Secondary | ICD-10-CM | POA: Diagnosis not present

## 2015-02-12 DIAGNOSIS — I5032 Chronic diastolic (congestive) heart failure: Secondary | ICD-10-CM | POA: Diagnosis not present

## 2015-02-12 LAB — HEMOGLOBIN A1C
HEMOGLOBIN A1C: 5.7 % — AB (ref 4.8–5.6)
Mean Plasma Glucose: 117 mg/dL

## 2015-02-12 LAB — CBC
HCT: 38.9 % (ref 36.0–46.0)
Hemoglobin: 12.2 g/dL (ref 12.0–15.0)
MCH: 29.8 pg (ref 26.0–34.0)
MCHC: 31.4 g/dL (ref 30.0–36.0)
MCV: 94.9 fL (ref 78.0–100.0)
PLATELETS: 280 10*3/uL (ref 150–400)
RBC: 4.1 MIL/uL (ref 3.87–5.11)
RDW: 14.2 % (ref 11.5–15.5)
WBC: 8.5 10*3/uL (ref 4.0–10.5)

## 2015-02-12 LAB — GLUCOSE, CAPILLARY
GLUCOSE-CAPILLARY: 121 mg/dL — AB (ref 65–99)
GLUCOSE-CAPILLARY: 136 mg/dL — AB (ref 65–99)
GLUCOSE-CAPILLARY: 169 mg/dL — AB (ref 65–99)
Glucose-Capillary: 128 mg/dL — ABNORMAL HIGH (ref 65–99)

## 2015-02-12 LAB — BASIC METABOLIC PANEL
ANION GAP: 13 (ref 5–15)
BUN: 57 mg/dL — ABNORMAL HIGH (ref 6–20)
CALCIUM: 9 mg/dL (ref 8.9–10.3)
CO2: 29 mmol/L (ref 22–32)
CREATININE: 1.75 mg/dL — AB (ref 0.44–1.00)
Chloride: 96 mmol/L — ABNORMAL LOW (ref 101–111)
GFR, EST AFRICAN AMERICAN: 31 mL/min — AB (ref >60)
GFR, EST NON AFRICAN AMERICAN: 27 mL/min — AB (ref >60)
GLUCOSE: 140 mg/dL — AB (ref 65–99)
Potassium: 4.6 mmol/L (ref 3.5–5.1)
Sodium: 138 mmol/L (ref 135–145)

## 2015-02-12 MED ORDER — DEXTROSE 5 % IV SOLN
500.0000 mg | INTRAVENOUS | Status: DC
  Administered 2015-02-12 – 2015-02-13 (×2): 500 mg via INTRAVENOUS
  Filled 2015-02-12 (×3): qty 500

## 2015-02-12 MED ORDER — BUDESONIDE-FORMOTEROL FUMARATE 80-4.5 MCG/ACT IN AERO
2.0000 | INHALATION_SPRAY | Freq: Two times a day (BID) | RESPIRATORY_TRACT | Status: DC
  Administered 2015-02-12 – 2015-02-15 (×6): 2 via RESPIRATORY_TRACT
  Filled 2015-02-12: qty 6.9

## 2015-02-12 MED ORDER — IPRATROPIUM-ALBUTEROL 0.5-2.5 (3) MG/3ML IN SOLN
3.0000 mL | RESPIRATORY_TRACT | Status: DC
  Administered 2015-02-12 – 2015-02-14 (×10): 3 mL via RESPIRATORY_TRACT
  Filled 2015-02-12 (×12): qty 3

## 2015-02-12 MED ORDER — DEXTROSE 5 % IV SOLN
1.0000 g | INTRAVENOUS | Status: DC
  Administered 2015-02-12 – 2015-02-14 (×3): 1 g via INTRAVENOUS
  Filled 2015-02-12 (×4): qty 10

## 2015-02-12 MED ORDER — RIVAROXABAN 15 MG PO TABS
15.0000 mg | ORAL_TABLET | Freq: Every day | ORAL | Status: DC
  Administered 2015-02-12 – 2015-02-14 (×3): 15 mg via ORAL
  Filled 2015-02-12 (×3): qty 1

## 2015-02-12 NOTE — Evaluation (Addendum)
Occupational Therapy Evaluation Patient Details Name: Jordan Byrd MRN: [id number] DOB: 1933-09-03 Today's Date: 02/12/2015    History of Present Illness 82 y.o. female with PMH of arthritis, anemia, Macular degeneration, osteoarthritis, GERD, hypertension, COPD on home oxygen, chronic diastolic CHF, and paroxysmal atrial fibrillation, back surgery, foot surgery, and bilateral knee surgery, THA, tear in left shoulder per MRI, who presents to the ED for evaluation of progressively worsening dyspnea.    Clinical Impression   Pt admitted with above. Pt independent with ADLs, PTA. Feel pt will benefit from acute OT to increase independence, reinforce energy conservation, and address LUE prior to d/c. Recommending HHOT.    Follow Up Recommendations  Home health OT;Supervision/Assistance - 24 hour    Equipment Recommendations  Other (comment) (TBD)    Recommendations for Other Services       Precautions / Restrictions Precautions Precautions: Fall Restrictions Weight Bearing Restrictions: No      Mobility Bed Mobility               General bed mobility comments: not assessed  Transfers Overall transfer level: Needs assistance   Transfers: Sit to/from Stand Sit to Stand: Min guard              Balance    Unsteady with short distance ambulation-assist given.                                        ADL Overall ADL's : Needs assistance/impaired                     Lower Body Dressing: Sit to/from stand;Moderate assistance   Toilet Transfer: Minimal assistance;Ambulation (sit to stand from chair-Min guard)           Functional mobility during ADLs: Minimal assistance General ADL Comments: Educated on energy conservation techniques. Educated on options for shower chair.  Educated on UB dressing technique and clothing that may be easier to manage. Educated on how she could wash under left armpit and also apply deodorant under left arm.      Vision  history of macular degeneration.   Perception     Praxis      Pertinent Vitals/Pain Pain Assessment: 0-10 Pain Score: 5  Pain Location: chest, IV site in right arm, LLE, LUE  Pain Intervention(s): Monitored during session;Limited activity within patient's tolerance     Hand Dominance     Extremity/Trunk Assessment Upper Extremity Assessment Upper Extremity Assessment: RUE deficits/detail;LUE deficits/detail RUE Deficits / Details: generalized weakness LUE Deficits / Details: tear in left shoulder per MRI-less than 90 degrees AROM shoulder flexion   Lower Extremity Assessment Lower Extremity Assessment: Defer to PT evaluation       Communication Communication Communication: No difficulties   Cognition Arousal/Alertness: Awake/alert Behavior During Therapy: WFL for tasks assessed/performed Overall Cognitive Status: No family/caregiver present to determine baseline cognitive functioning (decreased safety awareness)                     General Comments       Exercises       Shoulder Instructions      Home Living Family/patient expects to be discharged to:: Private residence Living Arrangements: Alone Available Help at Discharge: Available PRN/intermittently (grandson) Type of Home: House Home Access: Ramped entrance     Home Layout: One level     Bathroom Shower/Tub: Tub/shower unit;Door  Home Equipment: [place] - 2 wheels;Walker - 4 wheels;Walker - standard;Bedside commode;Adaptive equipment Adaptive Equipment: Sock aid        Prior Functioning/Environment Level of Independence: Independent             OT Diagnosis: Generalized weakness   OT Problem List: Decreased range of motion;Impaired balance (sitting and/or standing);Decreased activity tolerance;Decreased strength;Decreased safety awareness;Decreased knowledge of use of DME or AE;Impaired UE functional use;Pain;Increased edema;Decreased knowledge of  precautions   OT Treatment/Interventions: Self-care/ADL training;Therapeutic exercise;Energy conservation;DME and/or AE instruction;Therapeutic activities;Patient/family education;Balance training;Cognitive remediation/compensation    OT Goals(Current goals can be found in the care plan section) Acute Rehab OT Goals Patient Stated Goal: not stated OT Goal Formulation: With patient Time For Goal Achievement: 02/19/15 Potential to Achieve Goals: Good ADL Goals Pt Will Perform Lower Body Dressing: with set-up;with supervision;sit to/from stand Pt Will Transfer to Toilet: with supervision;with set-up;ambulating;bedside commode Pt Will Perform Toileting - Clothing Manipulation and hygiene: with set-up;with supervision;sit to/from stand Pt Will Perform Tub/Shower Transfer: Tub transfer;with min guard assist;ambulating (shower DME TBD) Additional ADL Goal #1: Pt will perform bilateral UE exercises to increase strength. Additional ADL Goal #2: Pt will independently verbalize 3 energy conservation techniques and utilize in session as needed.  OT Frequency: Min 2X/week   Barriers to D/C:            Co-evaluation              End of Session Equipment Utilized During Treatment: Gait belt;Oxygen Nurse Communication: Mobility status;Other (comment) (needs chair alarm)  Activity Tolerance: Patient limited by fatigue Patient left: in chair;with call bell/phone within reach   Time: [id number] OT Time Calculation (min): 19 min Charges:  OT General Charges $OT Visit: 1 Procedure OT Evaluation $OT Eval Moderate Complexity: 1 Procedure G-CodesBenito Mccreedy OTR/L [id number] 02/12/2015, 4:34 PM

## 2015-02-12 NOTE — Progress Notes (Signed)
OT Cancellation Note  Patient Details Name: Jordan Byrd MRN: [id number] DOB: [birth year]   Cancelled Treatment:    Reason Eval/Treat Not Completed: Patient at procedure or test/ unavailable when OT went to her room earlier today.   Benito Mccreedy OTR/L [id number] 02/12/2015, 1:27 PM

## 2015-02-12 NOTE — Procedures (Signed)
Pt does not want her 00:00 treatment or 04:00 treatment if she is asleep.

## 2015-02-12 NOTE — Care Management Important Message (Signed)
Important Message  Patient Details  Name: Jordan Byrd MRN: [id number] Date of Birth: 10-11-1933   Medicare Important Message Given:  Yes    Louanne Belton 02/12/2015, 1:19 PMImportant Message  Patient Details  Name: Jordan Byrd MRN: [id number] Date of Birth: 02/28/1933   Medicare Important Message Given:  Yes    Keva Darty G 02/12/2015, 1:19 PM

## 2015-02-12 NOTE — Progress Notes (Signed)
[facility]                                                                              Patient Demographics  Jordan Byrd, is a 82 y.o. female, DOB - [birth year], [id number]  Admit date - 02/09/2015   Admitting Physician Vianne Bulls, MD  Outpatient Primary MD for the patient is GREEN, Keenan Bachelor, MD  LOS - 3   Chief Complaint  Patient presents with  . Shortness of Breath       Brief HPI   Jordan Byrd is a 82 y.o. female with PMH of COPD on home O2, chronic diastolic CHF, and paroxysmal Afib presented to ED with progressively worsening dyspnea for last 3 days. O2 sats below 80% on her usual 2 L at home, hence using 3 L at home. With associated dry cough, bilateral ankle swelling, no orthopnea chest pain or palpitations. She discussed her condition over the phone with her PCP and it was recommended that she increase her Lasix. She has been doubling her daily Lasix dose, but with no appreciable effect on her respiratory status. Home neb treatments have helped only slightly.  In ED, vital signs stable, chest x-ray showed clear lungs with no edema or focal infiltrates. BNP came back at 41. Additional blood work was notable for a slight lump in her serum creatinine and mild hyperglycemia, but CBC was normal.   Assessment & Plan     Acute on chronic respiratory failure with acute COPD exacerbation: coughing and wheezing today - Continue scheduled Nebs, increase to every 4 hours, continue IV Solu-Medrol every 6 hours  - DC Augmentin, placed on IV Zithromax and Rocephin, Symbicort flutter valve   -  added tesselon, cepacol, patient declines cough syrup   Chronic diastolic CHF  - Appears euvolemic.  TTE on 07/14/14 demonstrates a preserved EF, BNP low, trace edema - Creatinine slowly trending up, 1.7, continue to hold Lasix, losartan.  - continue strict I's and O's and daily weights - Continue nebivolol,   Hypertension  - Will continue home nebivolol, Norvasc     Paroxysmal a fib  -Currently in normal sinus rhythm - Continue Xarelto, flecanide  - CHADS vasc 3-4  Mild AKI on CKD III - SCr 1.45 on admission, baseline of 1.2 - 1.3  - Small bump likely due to recently increased Lasix dose as pt thought dyspnea was CHF-related  - Euvolemic currently, hold lasix  and losartan     Hyperglycemia  - No diagnosis of DM  - Follow CBGs, patient is on steroids  Acute on Chronic left shoulder pain: has history of rotator cuff tear, has surgeries in the past. - Left shoulder xray negative, discussed in detail with the patient, she wants the MRI of the left shoulder  - MRI showed moderate tendinosis of the supraspinatus tendon with small partial thickness tear of the anterior supraspinatus tendon. - Patient will need physical therapy. She declined left shoulder sling and will follow up with her orthopedics after she is discharged.   Code Status: Full code  Family Communication: Discussed in detail with the patient, all imaging results, lab results explained  to the patient    Disposition Plan:   Time Spent in minutes   25 minutes  Procedures  X-ray left shoulder MRI of the left shoulder    Consults   none  DVT Prophylaxis  xarelto  Medications  Scheduled Meds: . amLODipine  10 mg Oral Daily  . azithromycin  500 mg Intravenous Q24H  . benzonatate  200 mg Oral TID  . budesonide-formoterol  2 puff Inhalation BID  . cefTRIAXone (ROCEPHIN)  IV  1 g Intravenous Q24H  . flecainide  100 mg Oral Q12H  . insulin aspart  0-9 Units Subcutaneous TID WC  . ipratropium-albuterol  3 mL Nebulization Q4H  . methylPREDNISolone (SOLU-MEDROL) injection  60 mg Intravenous Q6H  . multivitamin with minerals  1 tablet Oral Daily  . nebivolol  20 mg Oral Daily  . pantoprazole  40 mg Oral Daily  . rivaroxaban  15 mg Oral Daily  . sodium chloride  3 mL Intravenous Q12H   Continuous Infusions:  PRN Meds:.sodium chloride, acetaminophen,  ipratropium-albuterol, menthol-cetylpyridinium, ondansetron (ZOFRAN) IV, sodium chloride, zolpidem   Antibiotics   Anti-infectives    Start     Dose/Rate Route Frequency Ordered Stop   02/12/15 1000  azithromycin (ZITHROMAX) 500 mg in dextrose 5 % 250 mL IVPB     500 mg 250 mL/hr over 60 Minutes Intravenous Every 24 hours 02/12/15 0905     02/12/15 1000  cefTRIAXone (ROCEPHIN) 1 g in dextrose 5 % 50 mL IVPB     1 g 100 mL/hr over 30 Minutes Intravenous Every 24 hours 02/12/15 0905     02/10/15 1200  azithromycin (ZITHROMAX) tablet 500 mg  Status:  Discontinued     500 mg Oral Daily 02/10/15 1146 02/10/15 1155   02/10/15 1200  amoxicillin-clavulanate (AUGMENTIN) 875-125 MG per tablet 1 tablet  Status:  Discontinued     1 tablet Oral Every 12 hours 02/10/15 1158 02/12/15 [id number]        Subjective:   Jordan Byrd was seen and examined today. somewhat coughing more and wheezing today.  No fevers or chills. Patient denies dizziness, chest pain, abdominal pain, N/V/D/C, new weakness, numbess, tingling. No acute events overnight.    Objective:   Blood pressure 140/55, pulse 74, temperature 98 F (36.7 C), temperature source Oral, resp. rate 18, height 5\' 6"  (1.676 m), weight 114.352 kg (252 lb 1.6 oz), SpO2 98 %.  Wt Readings from Last 3 Encounters:  02/12/15 114.352 kg (252 lb 1.6 oz)  12/04/14 115.667 kg (255 lb)  06/14/14 117.572 kg (259 lb 3.2 oz)     Intake/Output Summary (Last 24 hours) at 02/12/15 1332 Last data filed at 02/12/15 1156  Gross per 24 hour  Intake    460 ml  Output   1350 ml  Net   -890 ml    Exam  General: Alert and oriented x 3, NAD  HEENT:  PERRLA, EOMI, Anicteric Sclera, mucous membranes moist.   Neck: Supple, no JVD, no masses  CVS: S1 S2 auscultated, no rubs, murmurs or gallops. Regular rate and rhythm.  Respiratory: bilateral expiratory wheezing  Abdomen: Soft,NT, ND, NBS  Ext: no cyanosis clubbing or edema, left shoulder chronic pain and  weakness  Neuro: no new deficits  Skin: No rashes  Psych: Normal affect and demeanor, alert and oriented x3    Data Review   Micro Results No results found for this or any previous visit (from the past 240 hour(s)).  Radiology Reports Dg Chest 2 View  02/09/2015  CLINICAL DATA:  Shortness of breath and wheezing for 1 week. On diuresis for fluid on lungs, per physician. History of COPD, CHF, hypertension, atrial fibrillation. EXAM: CHEST  2 VIEW COMPARISON:  Chest x-rays dated 05/23/2014 and 08/15/2013. FINDINGS: Heart size is upper normal, stable. Overall cardiomediastinal silhouette remains normal in size and configuration. Mild atherosclerotic changes again noted at the aortic arch. Lungs are clear. No evidence of pneumonia. No pleural effusions seen. No evidence of active congestive heart failure or pulmonary edema. No pneumothorax seen. Mild degenerative changes noted at the shoulders. Anterior cervical fusion hardware again noted within the lower cervical spine. Surgical clips again noted over the left lower chest wall. No acute osseous or soft tissue abnormality seen. IMPRESSION: Lungs are clear and there is no evidence of acute cardiopulmonary abnormality. No evidence of pneumonia. No evidence of congestive heart failure. Electronically Signed   By: Franki Cabot M.D.   On: 02/09/2015 14:22   Mr Shoulder Left Wo Contrast  02/12/2015  CLINICAL DATA:  Shoulder. History of rotator cuff tear. Prior surgery. EXAM: MRI OF THE LEFT SHOULDER WITHOUT CONTRAST TECHNIQUE: Multiplanar, multisequence MR imaging of the shoulder was performed. No intravenous contrast was administered. COMPARISON:  None. FINDINGS: Rotator cuff: Moderate tendinosis of the supraspinatus tendon with a partial thickness bursal surface tear 1.4 cm from the peripheral insertion. Small partial thickness articular surface tear of the anterior supraspinatus tendon. Small interstitial tear at the musculotendinous junction of the  infraspinatus. Teres minor tendon is intact. Subscapularis tendon is intact. Muscles: No atrophy or fatty replacement of nor abnormal signal within, the muscles of the rotator cuff. Biceps long head: Severe tendinosis of the intraarticular portion of the long head of the biceps tendon. Acromioclavicular Joint: Moderate degenerative changes of the acromioclavicular joint. Small amount of subacromial/subdeltoid bursal fluid. Type I acromion. Glenohumeral Joint: No joint effusion.  No chondral defect. Labrum: Grossly intact, but evaluation is limited by lack of intraarticular fluid. Bones:  No marrow signal abnormality.  No fracture or dislocation. IMPRESSION: 1. Moderate tendinosis of the supraspinatus tendon with a partial thickness bursal surface tear 1.4 cm from the peripheral insertion. Small partial thickness articular surface tear of the anterior supraspinatus tendon. 2. Small interstitial tear at the musculotendinous junction of the infraspinatus. 3. Severe tendinosis of the intraarticular portion of the long head of the biceps tendon. Electronically Signed   By: Kathreen Devoid   On: 02/12/2015 11:28   Dg Shoulder Left Port  02/11/2015  CLINICAL DATA:  Left shoulder pain.  No history of trauma. EXAM: LEFT SHOULDER - 1 VIEW COMPARISON:  None. FINDINGS: There is no evidence of fracture or dislocation. There is no evidence of arthropathy or other focal bone abnormality. Osseous mineralization is normal. Surgical clips are seen overlying the left lateral chest wall. Soft tissues are otherwise unremarkable. IMPRESSION: Negative. Electronically Signed   By: Fidela Salisbury M.D.   On: 02/11/2015 16:12    CBC  Recent Labs Lab 02/09/15 1342 02/10/15 0347 02/11/15 0230 02/12/15 0335  WBC 7.8 7.7 8.4 8.5  HGB 12.8 12.4 12.1 12.2  HCT 41.7 40.0 38.2 38.9  PLT 258 260 260 280  MCV 97.2 95.2 95.5 94.9  MCH 29.8 29.5 30.3 29.8  MCHC 30.7 31.0 31.7 31.4  RDW 14.2 14.1 14.2 14.2    Chemistries    Recent Labs Lab 02/09/15 1342 02/10/15 0347 02/11/15 0230 02/12/15 0335  NA 144 140 137 138  K 4.0 4.6 4.3 4.6  CL 102 100*  99* 96*  CO2 28 29 26 29   GLUCOSE 128* 136* 135* 140*  BUN 31* 35* 48* 57*  CREATININE 1.45* 1.51* 1.66* 1.75*  CALCIUM 9.6 9.3 9.0 9.0   ------------------------------------------------------------------------------------------------------------------ estimated creatinine clearance is 33.5 mL/min (by C-G formula based on Cr of 1.75). ------------------------------------------------------------------------------------------------------------------  Recent Labs  02/09/15 2027  HGBA1C 5.7*   ------------------------------------------------------------------------------------------------------------------ No results for input(s): CHOL, HDL, LDLCALC, TRIG, CHOLHDL, LDLDIRECT in the last 72 hours. ------------------------------------------------------------------------------------------------------------------ No results for input(s): TSH, T4TOTAL, T3FREE, THYROIDAB in the last 72 hours.  Invalid input(s): FREET3 ------------------------------------------------------------------------------------------------------------------ No results for input(s): VITAMINB12, FOLATE, FERRITIN, TIBC, IRON, RETICCTPCT in the last 72 hours.  Coagulation profile No results for input(s): INR, PROTIME in the last 168 hours.  No results for input(s): DDIMER in the last 72 hours.  Cardiac Enzymes No results for input(s): CKMB, TROPONINI, MYOGLOBIN in the last 168 hours.  Invalid input(s): CK ------------------------------------------------------------------------------------------------------------------ Invalid input(s): [place]  02/11/15 0637 02/11/15 1137 02/11/15 1701 02/11/15 2110 02/12/15 0541 02/12/15 [place] M.D. [facility] 02/12/2015, 1:32 PM  Pager: [phone] Between 7am to 7pm  - call Pager - [phone]  After 7pm go to [email or web address] - password [email or web address]  Call night coverage person covering after 7pm

## 2015-02-12 NOTE — Progress Notes (Signed)
Pr refused bed alarm tonight

## 2015-02-12 NOTE — Progress Notes (Signed)
Utilization Review Completed.Jordan Byrd T1/10/2015  

## 2015-02-13 ENCOUNTER — Other Ambulatory Visit: Payer: Self-pay

## 2015-02-13 DIAGNOSIS — J9611 Chronic respiratory failure with hypoxia: Secondary | ICD-10-CM | POA: Diagnosis not present

## 2015-02-13 DIAGNOSIS — J441 Chronic obstructive pulmonary disease with (acute) exacerbation: Secondary | ICD-10-CM | POA: Diagnosis not present

## 2015-02-13 DIAGNOSIS — N183 Chronic kidney disease, stage 3 (moderate): Secondary | ICD-10-CM | POA: Diagnosis not present

## 2015-02-13 DIAGNOSIS — I5032 Chronic diastolic (congestive) heart failure: Secondary | ICD-10-CM | POA: Diagnosis not present

## 2015-02-13 LAB — BASIC METABOLIC PANEL
Anion gap: 13 (ref 5–15)
BUN: 59 mg/dL — AB (ref 6–20)
CALCIUM: 8.3 mg/dL — AB (ref 8.9–10.3)
CHLORIDE: 98 mmol/L — AB (ref 101–111)
CO2: 27 mmol/L (ref 22–32)
CREATININE: 1.49 mg/dL — AB (ref 0.44–1.00)
GFR, EST AFRICAN AMERICAN: 37 mL/min — AB (ref >60)
GFR, EST NON AFRICAN AMERICAN: 32 mL/min — AB (ref >60)
Glucose, Bld: 140 mg/dL — ABNORMAL HIGH (ref 65–99)
Potassium: 4.3 mmol/L (ref 3.5–5.1)
Sodium: 138 mmol/L (ref 135–145)

## 2015-02-13 LAB — CBC
HCT: 36.9 % (ref 36.0–46.0)
Hemoglobin: 11.9 g/dL — ABNORMAL LOW (ref 12.0–15.0)
MCH: 30.7 pg (ref 26.0–34.0)
MCHC: 32.2 g/dL (ref 30.0–36.0)
MCV: 95.1 fL (ref 78.0–100.0)
PLATELETS: 245 10*3/uL (ref 150–400)
RBC: 3.88 MIL/uL (ref 3.87–5.11)
RDW: 14.1 % (ref 11.5–15.5)
WBC: 6.9 10*3/uL (ref 4.0–10.5)

## 2015-02-13 LAB — GLUCOSE, CAPILLARY
GLUCOSE-CAPILLARY: 121 mg/dL — AB (ref 65–99)
GLUCOSE-CAPILLARY: 203 mg/dL — AB (ref 65–99)
Glucose-Capillary: 135 mg/dL — ABNORMAL HIGH (ref 65–99)
Glucose-Capillary: 135 mg/dL — ABNORMAL HIGH (ref 65–99)
Glucose-Capillary: 142 mg/dL — ABNORMAL HIGH (ref 65–99)

## 2015-02-13 MED ORDER — DOCUSATE SODIUM 100 MG PO CAPS
100.0000 mg | ORAL_CAPSULE | Freq: Two times a day (BID) | ORAL | Status: DC
  Administered 2015-02-13 – 2015-02-14 (×3): 100 mg via ORAL
  Filled 2015-02-13 (×5): qty 1

## 2015-02-13 MED ORDER — CETYLPYRIDINIUM CHLORIDE 0.05 % MT LIQD
7.0000 mL | Freq: Two times a day (BID) | OROMUCOSAL | Status: DC
  Administered 2015-02-13 – 2015-02-14 (×3): 7 mL via OROMUCOSAL

## 2015-02-13 MED ORDER — MAGNESIUM CITRATE PO SOLN
1.0000 | Freq: Once | ORAL | Status: AC
  Administered 2015-02-13: 1 via ORAL
  Filled 2015-02-13: qty 296

## 2015-02-13 MED ORDER — POLYETHYLENE GLYCOL 3350 17 G PO PACK: 17.0000 g | PACK | Freq: Every day | ORAL | Status: DC

## 2015-02-13 NOTE — Evaluation (Signed)
Physical Therapy Re-Evaluation Patient Details Name: Jordan Byrd MRN: [id number] DOB: 1933/09/14 Today's Date: 02/13/2015   History of Present Illness  81 y.o. female with PMH of arthritis, anemia, Macular degeneration, osteoarthritis, GERD, hypertension, COPD on home oxygen, chronic diastolic CHF, and paroxysmal atrial fibrillation, back surgery, foot surgery, and bilateral knee surgery, THA, who presents to the ED for evaluation of progressively worsening dyspnea.   Clinical Impression   Reorder received and PT Re-evaluation complete; Pt does present with decr functional mobility and endurance compared to baseline PTA; Will benefit from PT services to address deficits and facilitate return to prior level of function; OT to address shoulder injury    Follow Up Recommendations Home health PT [facility] as well for chronic disease management)    Equipment Recommendations  None recommended by PT    Recommendations for Other Services OT consult     Precautions / Restrictions Precautions Precautions: Fall Restrictions Weight Bearing Restrictions: No      Mobility  Bed Mobility Overal bed mobility: Needs Assistance Bed Mobility: Supine to Sit;Sit to Supine     Supine to sit: Min assist Sit to supine: Modified independent (Device/Increase time)   General bed mobility comments: assist with trunk to come to sitting position.  Transfers Overall transfer level: Needs assistance Equipment used: Rolling walker (2 wheeled) Transfers: Sit to/from Stand Sit to Stand: Supervision         General transfer comment: Stood somewhat impulsively  Ambulation/Gait Ambulation/Gait assistance: Supervision (with monitor of O2 sats) Ambulation Distance (Feet): 40 Feet Assistive device: Rolling walker (2 wheeled) (2L O2) Gait Pattern/deviations: Step-through pattern;Decreased step length - right;Decreased step length - left;Decreased stride length Gait velocity: slow   General Gait Details:  Cues to self-monitor for activity tolerance; Amb on 2L O2 and sats ranged 86% to 93%  Stairs            Wheelchair Mobility    Modified Rankin (Stroke Patients Only)       Balance                                             Pertinent Vitals/Pain Pain Assessment: Faces Pain Score: 5  Faces Pain Scale: Hurts little more Pain Location: headache Pain Descriptors / Indicators: Aching Pain Intervention(s): Monitored during session    Home Living Family/patient expects to be discharged to:: Private residence Living Arrangements: Alone Available Help at Discharge: Available PRN/intermittently (grandson) Type of Home: House Home Access: Ramped entrance     Home Layout: One level Home Equipment: Environmental consultant - 2 wheels;Walker - 4 wheels;Walker - standard;Bedside commode;Adaptive equipment Additional Comments: RW at times, furniture at times    Prior Function Level of Independence: Independent         Comments: Reports she goes out to eat twice a week with grandson; uses RW then and is usually able to walk all the way from car to table without needing a rest break     Hand Dominance        Extremity/Trunk Assessment   Upper Extremity Assessment: Defer to OT evaluation (OT to address shoulder injury) RUE Deficits / Details: generalized weakness     LUE Deficits / Details: tear in right shoulder-less than 90 degrees AROM shoulder flexion   Lower Extremity Assessment: Generalized weakness         Communication   Communication: No difficulties  Cognition Arousal/Alertness:  Awake/alert Behavior During Therapy: WFL for tasks assessed/performed Overall Cognitive Status: Within Functional Limits for tasks assessed                      General Comments      Exercises        Assessment/Plan    PT Assessment Patient needs continued PT services  PT Diagnosis Difficulty walking (Cardiorespiratory status limiting mobility)   PT  Problem List Decreased strength;Decreased range of motion;Decreased activity tolerance;Decreased balance;Decreased mobility;Decreased knowledge of use of DME;Decreased knowledge of precautions;Cardiopulmonary status limiting activity  PT Treatment Interventions DME instruction;Gait training;Functional mobility training;Therapeutic activities;Therapeutic exercise;Balance training;Patient/family education   PT Goals (Current goals can be found in the Care Plan section) Acute Rehab PT Goals Patient Stated Goal: would like to get back to going out to dinner PT Goal Formulation: With patient Time For Goal Achievement: 02/13/15 Potential to Achieve Goals: Good    Frequency Min 3X/week   Barriers to discharge        Co-evaluation               End of Session Equipment Utilized During Treatment: Oxygen Activity Tolerance: Patient tolerated treatment well (limited by dyspnea) Patient left: in chair;with call bell/phone within reach;with family/visitor present           Time: 1040-1104 PT Time Calculation (min) (ACUTE ONLY): 24 min   Charges:   PT Evaluation $PT Re-evaluation: 1 Procedure PT Treatments $Gait Training: 8-22 mins   PT G Codes:        Quin Hoop 02/13/2015, 11:17 AM  Roney Marion, [place] Pager [phone] Office [phone]

## 2015-02-13 NOTE — Progress Notes (Signed)
[facility]                                                                              Patient Demographics  Jordan Byrd, is a 82 y.o. female, DOB - Jan 18, 1934, [id number]  Admit date - 02/09/2015   Admitting Physician Vianne Bulls, MD  Outpatient Primary MD for the patient is GREEN, Keenan Bachelor, MD  LOS - 4   Chief Complaint  Patient presents with  . Shortness of Breath       Brief HPI   Jordan Byrd is a 82 y.o. female with PMH of COPD on home O2, chronic diastolic CHF, and paroxysmal Afib presented to ED with progressively worsening dyspnea for last 3 days. O2 sats below 80% on her usual 2 L at home, hence using 3 L at home. With associated dry cough, bilateral ankle swelling, no orthopnea chest pain or palpitations. She discussed her condition over the phone with her PCP and it was recommended that she increase her Lasix. She has been doubling her daily Lasix dose, but with no appreciable effect on her respiratory status. Home neb treatments have helped only slightly.  In ED, vital signs stable, chest x-ray showed clear lungs with no edema or focal infiltrates. BNP came back at 41. Additional blood work was notable for a slight lump in her serum creatinine and mild hyperglycemia, but CBC was normal.   Assessment & Plan     Acute on chronic respiratory failure with acute COPD exacerbation: coughing and wheezing today - Continue scheduled Nebs, every 4 hours, continue IV Solu-Medrol every 6 hours , taper Solu-Medrol in a.m. if improving - on IV Zithromax and Rocephin, Symbicort flutter valve   - Continue tesselon, cepacol, patient declines cough syrup   Chronic diastolic CHF  - Appears euvolemic.  TTE on 07/14/14 demonstrates a preserved EF, BNP low, trace edema - Creatinine now improving, holding Lasix, losartan.  - continue strict I's and O's and daily weights, currently net negative balance - Continue nebivolol   Hypertension  - Will continue  home nebivolol, Norvasc    Paroxysmal a fib  -Currently in normal sinus rhythm - Continue Xarelto, flecanide  - CHADS vasc 3-4  Mild AKI on CKD III - SCr 1.45 on admission, baseline of 1.2 - 1.3  - Small bump likely due to recently increased Lasix dose as pt thought dyspnea was CHF-related  - Creatinine improving   Hyperglycemia  - No diagnosis of DM  - Follow CBGs, patient is on steroids  Acute on Chronic left shoulder pain: has history of rotator cuff tear, has surgeries in the past. - Left shoulder xray negative, discussed in detail with the patient, she wants the MRI of the left shoulder  - MRI showed moderate tendinosis of the supraspinatus tendon with small partial thickness tear of the anterior supraspinatus tendon. - Patient will need physical therapy. She declined left shoulder sling and will follow up with her orthopedics after she is discharged.   Code Status: Full code  Family Communication: Discussed in detail with the patient, all imaging results, lab results explained to the patient    Disposition  Plan: Hopefully DC in 24- 48 hrs. if improving  Time Spent in minutes   25 minutes  Procedures  X-ray left shoulder MRI of the left shoulder    Consults   none  DVT Prophylaxis  xarelto  Medications  Scheduled Meds: . amLODipine  10 mg Oral Daily  . azithromycin  500 mg Intravenous Q24H  . benzonatate  200 mg Oral TID  . budesonide-formoterol  2 puff Inhalation BID  . cefTRIAXone (ROCEPHIN)  IV  1 g Intravenous Q24H  . docusate sodium  100 mg Oral BID  . flecainide  100 mg Oral Q12H  . insulin aspart  0-9 Units Subcutaneous TID WC  . ipratropium-albuterol  3 mL Nebulization Q4H  . methylPREDNISolone (SOLU-MEDROL) injection  60 mg Intravenous Q6H  . multivitamin with minerals  1 tablet Oral Daily  . nebivolol  20 mg Oral Daily  . pantoprazole  40 mg Oral Daily  . rivaroxaban  15 mg Oral Daily  . sodium chloride  3 mL Intravenous Q12H    Continuous Infusions:  PRN Meds:.sodium chloride, acetaminophen, ipratropium-albuterol, menthol-cetylpyridinium, ondansetron (ZOFRAN) IV, sodium chloride, zolpidem   Antibiotics   Anti-infectives    Start     Dose/Rate Route Frequency Ordered Stop   02/12/15 1000  azithromycin (ZITHROMAX) 500 mg in dextrose 5 % 250 mL IVPB     500 mg 250 mL/hr over 60 Minutes Intravenous Every 24 hours 02/12/15 0905     02/12/15 1000  cefTRIAXone (ROCEPHIN) 1 g in dextrose 5 % 50 mL IVPB     1 g 100 mL/hr over 30 Minutes Intravenous Every 24 hours 02/12/15 0905     02/10/15 1200  azithromycin (ZITHROMAX) tablet 500 mg  Status:  Discontinued     500 mg Oral Daily 02/10/15 1146 02/10/15 1155   02/10/15 1200  amoxicillin-clavulanate (AUGMENTIN) 875-125 MG per tablet 1 tablet  Status:  Discontinued     1 tablet Oral Every 12 hours 02/10/15 1158 02/12/15 [id number]        Subjective:   Jordan Byrd was seen and examined today. Healing somewhat better today except having headache. Shortness of breath and wheezing is improving.  No fevers or chills. Patient denies dizziness, chest pain, abdominal pain, N/V/D/C, new weakness, numbess, tingling. No acute events overnight.    Objective:   Blood pressure 160/63, pulse 68, temperature 97.6 F (36.4 C), temperature source Oral, resp. rate 20, height 5\' 6"  (1.676 m), weight 115.6 kg (254 lb 13.6 oz), SpO2 99 %.  Wt Readings from Last 3 Encounters:  02/13/15 115.6 kg (254 lb 13.6 oz)  12/04/14 115.667 kg (255 lb)  06/14/14 117.572 kg (259 lb 3.2 oz)     Intake/Output Summary (Last 24 hours) at 02/13/15 1201 Last data filed at 02/13/15 1018  Gross per 24 hour  Intake   2140 ml  Output   1050 ml  Net   1090 ml    Exam  General: Alert and oriented x 3, NAD  HEENT:  PERRLA, EOMI  Neck: Supple, no JVD, no masses  CVS: S1 S2 clear, RRR  Respiratory: bilateral expiratory wheezing improving from yesterday  Abdomen: Soft,NT, ND, NBS  Ext: no  cyanosis clubbing or edema, left shoulder chronic pain and weakness  Neuro: no new deficits  Skin: No rashes  Psych: Normal affect and demeanor, alert and oriented x3    Data Review   Micro Results No results found for this or any previous visit (from the past 240 hour(s)).  Radiology  Reports Dg Chest 2 View  02/09/2015  CLINICAL DATA:  Shortness of breath and wheezing for 1 week. On diuresis for fluid on lungs, per physician. History of COPD, CHF, hypertension, atrial fibrillation. EXAM: CHEST  2 VIEW COMPARISON:  Chest x-rays dated 05/23/2014 and 08/15/2013. FINDINGS: Heart size is upper normal, stable. Overall cardiomediastinal silhouette remains normal in size and configuration. Mild atherosclerotic changes again noted at the aortic arch. Lungs are clear. No evidence of pneumonia. No pleural effusions seen. No evidence of active congestive heart failure or pulmonary edema. No pneumothorax seen. Mild degenerative changes noted at the shoulders. Anterior cervical fusion hardware again noted within the lower cervical spine. Surgical clips again noted over the left lower chest wall. No acute osseous or soft tissue abnormality seen. IMPRESSION: Lungs are clear and there is no evidence of acute cardiopulmonary abnormality. No evidence of pneumonia. No evidence of congestive heart failure. Electronically Signed   By: Franki Cabot M.D.   On: 02/09/2015 14:22   Mr Shoulder Left Wo Contrast  02/12/2015  CLINICAL DATA:  Shoulder. History of rotator cuff tear. Prior surgery. EXAM: MRI OF THE LEFT SHOULDER WITHOUT CONTRAST TECHNIQUE: Multiplanar, multisequence MR imaging of the shoulder was performed. No intravenous contrast was administered. COMPARISON:  None. FINDINGS: Rotator cuff: Moderate tendinosis of the supraspinatus tendon with a partial thickness bursal surface tear 1.4 cm from the peripheral insertion. Small partial thickness articular surface tear of the anterior supraspinatus tendon. Small  interstitial tear at the musculotendinous junction of the infraspinatus. Teres minor tendon is intact. Subscapularis tendon is intact. Muscles: No atrophy or fatty replacement of nor abnormal signal within, the muscles of the rotator cuff. Biceps long head: Severe tendinosis of the intraarticular portion of the long head of the biceps tendon. Acromioclavicular Joint: Moderate degenerative changes of the acromioclavicular joint. Small amount of subacromial/subdeltoid bursal fluid. Type I acromion. Glenohumeral Joint: No joint effusion.  No chondral defect. Labrum: Grossly intact, but evaluation is limited by lack of intraarticular fluid. Bones:  No marrow signal abnormality.  No fracture or dislocation. IMPRESSION: 1. Moderate tendinosis of the supraspinatus tendon with a partial thickness bursal surface tear 1.4 cm from the peripheral insertion. Small partial thickness articular surface tear of the anterior supraspinatus tendon. 2. Small interstitial tear at the musculotendinous junction of the infraspinatus. 3. Severe tendinosis of the intraarticular portion of the long head of the biceps tendon. Electronically Signed   By: Kathreen Devoid   On: 02/12/2015 11:28   Dg Shoulder Left Port  02/11/2015  CLINICAL DATA:  Left shoulder pain.  No history of trauma. EXAM: LEFT SHOULDER - 1 VIEW COMPARISON:  None. FINDINGS: There is no evidence of fracture or dislocation. There is no evidence of arthropathy or other focal bone abnormality. Osseous mineralization is normal. Surgical clips are seen overlying the left lateral chest wall. Soft tissues are otherwise unremarkable. IMPRESSION: Negative. Electronically Signed   By: Fidela Salisbury M.D.   On: 02/11/2015 16:12    CBC  Recent Labs Lab 02/09/15 1342 02/10/15 0347 02/11/15 0230 02/12/15 0335 02/13/15 0404  WBC 7.8 7.7 8.4 8.5 6.9  HGB 12.8 12.4 12.1 12.2 11.9*  HCT 41.7 40.0 38.2 38.9 36.9  PLT 258 260 260 280 245  MCV 97.2 95.2 95.5 94.9 95.1  MCH  29.8 29.5 30.3 29.8 30.7  MCHC 30.7 31.0 31.7 31.4 32.2  RDW 14.2 14.1 14.2 14.2 14.1    Chemistries   Recent Labs Lab 02/09/15 1342 02/10/15 0347 02/11/15 0230 02/12/15 0335  02/13/15 0404  NA 144 140 137 138 138  K 4.0 4.6 4.3 4.6 4.3  CL 102 100* 99* 96* 98*  CO2 28 29 26 29 27   GLUCOSE 128* 136* 135* 140* 140*  BUN 31* 35* 48* 57* 59*  CREATININE 1.45* 1.51* 1.66* 1.75* 1.49*  CALCIUM 9.6 9.3 9.0 9.0 8.3*   ------------------------------------------------------------------------------------------------------------------ estimated creatinine clearance is 39.5 mL/min (by C-G formula based on Cr of 1.49). ------------------------------------------------------------------------------------------------------------------ No results for input(s): HGBA1C in the last 72 hours. ------------------------------------------------------------------------------------------------------------------ No results for input(s): CHOL, HDL, LDLCALC, TRIG, CHOLHDL, LDLDIRECT in the last 72 hours. ------------------------------------------------------------------------------------------------------------------ No results for input(s): TSH, T4TOTAL, T3FREE, THYROIDAB in the last 72 hours.  Invalid input(s): FREET3 ------------------------------------------------------------------------------------------------------------------ No results for input(s): VITAMINB12, FOLATE, FERRITIN, TIBC, IRON, RETICCTPCT in the last 72 hours.  Coagulation profile No results for input(s): INR, PROTIME in the last 168 hours.  No results for input(s): DDIMER in the last 72 hours.  Cardiac Enzymes No results for input(s): CKMB, TROPONINI, MYOGLOBIN in the last 168 hours.  Invalid input(s): CK ------------------------------------------------------------------------------------------------------------------ Invalid input(s): [place]  02/12/15 1153 02/12/15 1625 02/12/15 2112 02/13/15 0546  02/13/15 0729 02/13/15 1107  GLUCAP 136* 128* 169* 135* 121* 135*     RAI,RIPUDEEP M.D. [facility] 02/13/2015, 12:01 PM  Pager: [phone] Between 7am to 7pm - call Pager - [phone]  After 7pm go to [email or web address] - password [email or web address]  Call night coverage person covering after 7pm

## 2015-02-13 NOTE — Consult Note (Signed)
   [facility] CM Inpatient Consult   02/13/2015  LUDORA SAAB 02-Sep-1933 [id number] Patient evaluated for community based chronic disease management services with [facility] as a benefit of patient's [employer] Medicare Insurance. She endorses that Dr. Levin Erp to be her primary care provider and eligible under her [employer] plan.  Services to the  patient at bedside were explained [place] [facility] services. Consent form signed.   Patient will receive post hospital discharge call and will be evaluated for monthly home visits for assessments and disease process education.  Left contact information and [facility] literature at bedside. Made Inpatient Case Manager aware that [place] [facility] following. Of note, [facility] Management services does not replace or interfere with any services that are arranged by inpatient case management or social work.  For additional questions or referrals please contact:   Natividad Brood, RN BSN [facility] Liaison  [phone] business mobile phone

## 2015-02-13 NOTE — Progress Notes (Addendum)
Occupational Therapy Treatment Patient Details Name: Jordan Byrd MRN: [id number] DOB: 07/16/1933 Today's Date: 02/13/2015    History of present illness 82 y.o. female with PMH of arthritis, anemia, Macular degeneration, osteoarthritis, GERD, hypertension, COPD on home oxygen, chronic diastolic CHF, and paroxysmal atrial fibrillation, back surgery, foot surgery, and bilateral knee surgery, THA, who presents to the ED for evaluation of progressively worsening dyspnea.    OT comments  Educated on LUE exercises. Will continue to follow acutely. Suggested pt maybe following up with an orthopedic MD.  Follow Up Recommendations  Home health OT;Supervision/Assistance - 24 hour (Outpatient OT after HHOT)    Equipment Recommendations  Other (comment) (TBD)    Recommendations for Other Services      Precautions / Restrictions Precautions Precautions: Fall Restrictions Weight Bearing Restrictions: No       Mobility Bed Mobility Overal bed mobility: Needs Assistance Bed Mobility: Supine to Sit;Sit to Supine     Supine to sit: Min assist Sit to supine: Modified independent (Device/Increase time)   General bed mobility comments: assist with trunk to come to sitting position.  Transfers                      Balance          No LOB in session.                         ADL Overall ADL's : Needs assistance/impaired        Eating/Feeding: Pt drank liquid sitting-Independently                               General ADL Comments: Educated on energy conservation. Discussed OT d/c recommendation.      Vision                     Perception     Praxis      Cognition  Awake/Alert Behavior During Therapy: WFL for tasks assessed/performed Overall Cognitive Status: Within Functional Limits for tasks assessed                       Extremity/Trunk Assessment               Exercises  Pt performed LUE exercises- Performed  the following in supine: Lt shoulder flexion, Lt shoulder abduction, Lt shoulder internal and external rotation. Performed Lt shoulder exercises also in sitting position-shoulder flexion, shoulder abduction to 45 degrees and shoulder abduction to 90 degrees, shoulder internal and external rotation. Performed lap slides. Educated on isometric exercise for LUE.   Shoulder Instructions       General Comments      Pertinent Vitals/ Pain       Pain Assessment: 0-10 Pain Score: 5  Pain Location: head Pain Descriptors / Indicators: Headache Pain Intervention(s): Monitored during session; returned to bed at end of session  [place] expects to be discharged to:: Private residence Living Arrangements: Alone                                      Prior Functioning/Environment              Frequency Min 2X/week     Progress Toward Goals  OT Goals(current goals can now be found in the  care plan section)  Progress towards OT goals: Progressing toward goals  Acute Rehab OT Goals Patient Stated Goal: not stated OT Goal Formulation: With patient Time For Goal Achievement: 02/19/15 Potential to Achieve Goals: Good ADL Goals Pt Will Perform Lower Body Dressing: with set-up;with supervision;sit to/from stand Pt Will Transfer to Toilet: with supervision;with set-up;ambulating;bedside commode Pt Will Perform Toileting - Clothing Manipulation and hygiene: with set-up;with supervision;sit to/from stand Pt Will Perform Tub/Shower Transfer: Tub transfer;with min guard assist;ambulating (shower DME TBD) Additional ADL Goal #1: Pt will perform bilateral UE exercises to increase strength. Additional ADL Goal #2: Pt will independently verbalize 3 energy conservation techniques and utilize in session as needed.  Plan Discharge plan remains appropriate    Co-evaluation                 End of Session Equipment Utilized During Treatment: Oxygen   Activity  Tolerance Patient tolerated treatment well   Patient Left in bed;with call bell/phone within reach;with bed alarm set;Other (comment) (nurse coming in room as OT exited room)   Nurse Communication Other (comment) (worked on UE)        Time: [phone] OT Time Calculation (min): 21 min  Charges: OT General Charges $OT Visit: 1 Procedure OT Treatments $Therapeutic Exercise: 8-22 mins  Benito Mccreedy OTR/L [id number] 02/13/2015, 10:55 AM

## 2015-02-14 DIAGNOSIS — I10 Essential (primary) hypertension: Secondary | ICD-10-CM

## 2015-02-14 DIAGNOSIS — J441 Chronic obstructive pulmonary disease with (acute) exacerbation: Principal | ICD-10-CM

## 2015-02-14 DIAGNOSIS — N183 Chronic kidney disease, stage 3 (moderate): Secondary | ICD-10-CM

## 2015-02-14 DIAGNOSIS — I480 Paroxysmal atrial fibrillation: Secondary | ICD-10-CM

## 2015-02-14 DIAGNOSIS — I5032 Chronic diastolic (congestive) heart failure: Secondary | ICD-10-CM

## 2015-02-14 DIAGNOSIS — J9611 Chronic respiratory failure with hypoxia: Secondary | ICD-10-CM | POA: Diagnosis not present

## 2015-02-14 LAB — BASIC METABOLIC PANEL
Anion gap: 12 (ref 5–15)
BUN: 54 mg/dL — ABNORMAL HIGH (ref 6–20)
CHLORIDE: 99 mmol/L — AB (ref 101–111)
CO2: 27 mmol/L (ref 22–32)
Calcium: 8.4 mg/dL — ABNORMAL LOW (ref 8.9–10.3)
Creatinine, Ser: 1.28 mg/dL — ABNORMAL HIGH (ref 0.44–1.00)
GFR calc non Af Amer: 39 mL/min — ABNORMAL LOW (ref >60)
GFR, EST AFRICAN AMERICAN: 45 mL/min — AB (ref >60)
Glucose, Bld: 141 mg/dL — ABNORMAL HIGH (ref 65–99)
POTASSIUM: 5.2 mmol/L — AB (ref 3.5–5.1)
SODIUM: 138 mmol/L (ref 135–145)

## 2015-02-14 LAB — GLUCOSE, CAPILLARY
GLUCOSE-CAPILLARY: 125 mg/dL — AB (ref 65–99)
GLUCOSE-CAPILLARY: 129 mg/dL — AB (ref 65–99)
Glucose-Capillary: 124 mg/dL — ABNORMAL HIGH (ref 65–99)
Glucose-Capillary: 123 mg/dL — ABNORMAL HIGH (ref 65–99)

## 2015-02-14 LAB — CBC
HEMATOCRIT: 37.5 % (ref 36.0–46.0)
Hemoglobin: 11.9 g/dL — ABNORMAL LOW (ref 12.0–15.0)
MCH: 29.9 pg (ref 26.0–34.0)
MCHC: 31.7 g/dL (ref 30.0–36.0)
MCV: 94.2 fL (ref 78.0–100.0)
Platelets: 246 10*3/uL (ref 150–400)
RBC: 3.98 MIL/uL (ref 3.87–5.11)
RDW: 13.9 % (ref 11.5–15.5)
WBC: 6.5 10*3/uL (ref 4.0–10.5)

## 2015-02-14 MED ORDER — RIVAROXABAN 20 MG PO TABS
20.0000 mg | ORAL_TABLET | Freq: Every day | ORAL | Status: DC
  Administered 2015-02-15: 20 mg via ORAL
  Filled 2015-02-14: qty 1

## 2015-02-14 MED ORDER — PREDNISONE 50 MG PO TABS
60.0000 mg | ORAL_TABLET | Freq: Every day | ORAL | Status: DC
  Administered 2015-02-15: 60 mg via ORAL
  Filled 2015-02-14: qty 1

## 2015-02-14 MED ORDER — FUROSEMIDE 20 MG PO TABS
20.0000 mg | ORAL_TABLET | Freq: Every day | ORAL | Status: DC
  Administered 2015-02-14 – 2015-02-15 (×2): 20 mg via ORAL
  Filled 2015-02-14 (×2): qty 1

## 2015-02-14 MED ORDER — AZITHROMYCIN 250 MG PO TABS
250.0000 mg | ORAL_TABLET | Freq: Every day | ORAL | Status: DC
  Administered 2015-02-14 – 2015-02-15 (×2): 250 mg via ORAL
  Filled 2015-02-14 (×2): qty 1

## 2015-02-14 NOTE — Progress Notes (Signed)
Occupational Therapy Treatment Patient Details Name: Jordan Byrd MRN: [id number] DOB: 12/27/1933 Today's Date: 02/14/2015    History of present illness 82 y.o. female with PMH of arthritis, anemia, Macular degeneration, osteoarthritis, GERD, hypertension, COPD on home oxygen, chronic diastolic CHF, and paroxysmal atrial fibrillation, back surgery, foot surgery, and bilateral knee surgery, THA, who presents to the ED for evaluation of progressively worsening dyspnea.    OT comments  Patient progressing towards OT goals, continue plan of care for now. See below for more information regarding this OT session, main focus on continued education for LUE HEP.    Follow Up Recommendations  Home health OT;Supervision/Assistance - 24 hour    Equipment Recommendations  None recommended by OT (at this time)    Recommendations for Other Services  None at this time   Precautions / Restrictions Precautions Precautions: Fall Restrictions Weight Bearing Restrictions: No    Mobility Bed Mobility General bed mobility comments: Pt found seated in recliner  Transfers General transfer comment: Did not occur        ADL Overall ADL's : Needs assistance/impaired General ADL Comments: Continued education on LUE HEP.      Cognition   Behavior During Therapy: WFL for tasks assessed/performed Overall Cognitive Status: No family/caregiver present to determine baseline cognitive functioning (Pt with decreased safety awareness and decreased STM)      Exercises Shoulder Exercises Shoulder Flexion: AAROM;Self ROM;Left;20 reps;Seated (2 sets of 10 reps) Shoulder Extension: AAROM;Left;20 reps;Seated;Self ROM (2 sets of 10 reps) Shoulder ABduction: AAROM;Self ROM;20 reps;Seated;Left (2 sets of 10 reps) Shoulder External Rotation: AROM;Left;20 reps;Seated (2 sets of 10 reps) Elbow Flexion: AROM;Left;20 reps;Seated (2 sets of 10 reps) Elbow Extension: AROM;Left;20 reps;Seated (2 sets of 10 reps) Wrist  Flexion: AROM;Left;20 reps;Seated (2 sets of 10 reps) Wrist Extension: AROM;Left;20 reps;Seated (2 sets of 10 reps)           Pertinent Vitals/ Pain       Pain Assessment: No/denies pain ("after my shoulder popped I have no pain")         Frequency Min 2X/week     Progress Toward Goals  OT Goals(current goals can now be found in the care plan section)  Progress towards OT goals: Progressing toward goals  Acute Rehab OT Goals Patient Stated Goal: go home soon OT Goal Formulation: With patient Time For Goal Achievement: 02/19/15 Potential to Achieve Goals: Good  Plan Discharge plan remains appropriate    End of Session Equipment Utilized During Treatment: Oxygen   Activity Tolerance Patient tolerated treatment well   Patient Left in chair;with call bell/phone within reach;with chair alarm set    Time: 1439-1455 OT Time Calculation (min): 16 min  Charges: OT General Charges $OT Visit: 1 Procedure OT Treatments $Therapeutic Exercise: 8-22 mins  Chrys Racer , MS, OTR/L, CLT Pager: [phone]  02/14/2015, 3:04 PM

## 2015-02-14 NOTE — Progress Notes (Signed)
[facility]                                                                              Patient Demographics  Jordan Byrd, is a 82 y.o. female, DOB - 1933/03/31, SN:[id number]  Admit date - 02/09/2015   Admitting Physician Vianne Bulls, MD  Outpatient Primary MD for the patient is GREEN, Keenan Bachelor, MD  LOS - 5   Chief Complaint  Patient presents with  . Shortness of Breath      HPI on 02/09/2015 by Dr. Christia Reading Opyd Jordan Byrd is a 82 y.o. female with PMH of COPD on home oxygen around the clock, chronic diastolic CHF, and paroxysmal atrial fibrillation on's her relative who presents to the ED for evaluation of progressively worsening dyspnea. Patient had been in her usual state of health until approximately 3 days ago when she developed difficulty catching her breath. Despite her home uses supplemental oxygen, she was desaturating down into the low 80s on her usual 2 L. There was an associated dry cough, and reported bilateral ankle swelling, but no orthopnea, chest pains, or palpitations. Patient denies any recent change in her diet or fluid intake. She discussed her condition over the phone with her PCP and it was recommended that she increase her Lasix. She has been doubling her daily Lasix dose, but with no appreciable effect on her respiratory status. Home neb treatments have helped only slightly. Patient continues her daily Symbicort with albuterol MDI or neb as needed. She is been using 3 L/m supplemental oxygen for the last couple days.  In ED, patient was found to be afebrile, saturating well on 3 L/m nasal cannula, and with vital signs stable. Portable chest x-ray demonstrated clear lungs without edema or focal infiltrate. EKG demonstrated normal sinus rhythm, troponin was undetectable, and BNP came back at 41. Additional blood work was notable for a slight lump in her serum creatinine and mild hyperglycemia, but CBC was normal. Patient was given a DuoNeb treatment in the  emergency department and while she did enjoy some subjective improvement, she desaturated to the low 80s on oxygen when ambulating. Patient was admitted to the hospital for ongoing evaluation and management of pain increasing supplemental O2 requirement with dyspnea and cough.  Assessment & Plan   Acute on chronic respiratory failure with acute COPD exacerbation -Patient continues to have cough but states this improved. Her wheezing has improved as well. However patient become short of breath with minimal exertion -Continue nebs, steroids, antibiotics, Symbicort, flutter valve, antitussives -Will transition patient to oral prednisone  Chronic diastolic congestive heart failure -Currently euvolemic and compensated -Echocardiogram 07/13/2012 EF 55-60%, left atrium mildly dilated -Continue to monitor intake and output, daily weights -Lasix and losartan held due to AKI -Continue nebivolol  Acute on chronic kidney disease, stage III -Baseline creatinine 1.2-1.3, was 1.45 upon admission -Creatinine currently 1.28 -Continue to monitor BMP  Hypertension -Continue nebivolol, amlodipine -Lasix and losartan held  Paroxysmal atrial fibrillation -Currently rate and rhythm controlled -Continue flecainide and Xarelto -CHADSVASC 3-4  Hyperglycemia -Secondary to steroids, no history of diabetes mellitus -Continue insulin sliding scale CBG monitoring  Acute on chronic left shoulder pain -Patient has had  rotator cuff tear with surgeries in the past -Left shoulder x-ray negative -MRI showed moderate tendinosis of the supraspinatus tendon with small partial-thickness tear of the anterior supraspinatus tendon -Physical therapy consulted and recommended home health -Patient declined shoulder sling, she will need to follow-up with orthopedics at discharge  Code Status: Full  Family Communication: None at bedside  Disposition Plan: Admitted, will transition to oral prednisone today and continue  to monitor respiratory status  Time Spent in minutes   30 minutes  Procedures  None  Consults   None  DVT Prophylaxis  Xarelto  Lab Results  Component Value Date   PLT 246 02/14/2015    Medications  Scheduled Meds: . amLODipine  10 mg Oral Daily  . antiseptic oral rinse  7 mL Mouth Rinse BID  . azithromycin  250 mg Oral Daily  . benzonatate  200 mg Oral TID  . budesonide-formoterol  2 puff Inhalation BID  . cefTRIAXone (ROCEPHIN)  IV  1 g Intravenous Q24H  . docusate sodium  100 mg Oral BID  . flecainide  100 mg Oral Q12H  . insulin aspart  0-9 Units Subcutaneous TID WC  . ipratropium-albuterol  3 mL Nebulization Q4H  . multivitamin with minerals  1 tablet Oral Daily  . nebivolol  20 mg Oral Daily  . pantoprazole  40 mg Oral Daily  . [START ON 02/15/2015] predniSONE  60 mg Oral Q breakfast  . [START ON 02/15/2015] rivaroxaban  20 mg Oral Daily  . sodium chloride  3 mL Intravenous Q12H   Continuous Infusions:  PRN Meds:.sodium chloride, acetaminophen, ipratropium-albuterol, menthol-cetylpyridinium, ondansetron (ZOFRAN) IV, sodium chloride, zolpidem  Antibiotics  Anti-infectives    Start     Dose/Rate Route Frequency Ordered Stop   02/14/15 1045  azithromycin (ZITHROMAX) tablet 250 mg     250 mg Oral Daily 02/14/15 1036     02/12/15 1000  azithromycin (ZITHROMAX) 500 mg in dextrose 5 % 250 mL IVPB  Status:  Discontinued     500 mg 250 mL/hr over 60 Minutes Intravenous Every 24 hours 02/12/15 0905 02/14/15 1036   02/12/15 1000  cefTRIAXone (ROCEPHIN) 1 g in dextrose 5 % 50 mL IVPB     1 g 100 mL/hr over 30 Minutes Intravenous Every 24 hours 02/12/15 0905     02/10/15 1200  azithromycin (ZITHROMAX) tablet 500 mg  Status:  Discontinued     500 mg Oral Daily 02/10/15 1146 02/10/15 1155   02/10/15 1200  amoxicillin-clavulanate (AUGMENTIN) 875-125 MG per tablet 1 tablet  Status:  Discontinued     1 tablet Oral Every 12 hours 02/10/15 1158 02/12/15 [id number]       Subjective:   Jordan Byrd seen and examined today.  Patient states she is feeling much better than previous days. Today's complain of some shoulder pain. Denies any chest pain, abdominal pain, nausea or vomiting. Does complain of some shortness of breath especially with minimal exertion.    Objective:   Filed Vitals:   02/14/15 0611 02/14/15 0927 02/14/15 0958 02/14/15 1145  BP: 158/72 168/93  143/61  Pulse: 73   69  Temp: 97.7 F (36.5 C)   98.1 F (36.7 C)  TempSrc: Oral   Oral  Resp: 20   18  Height:      Weight: 116.075 kg (255 lb 14.4 oz)     SpO2: 95%  98% 97%    Wt Readings from Last 3 Encounters:  02/14/15 116.075 kg (255 lb 14.4 oz)  12/04/14 115.667  kg (255 lb)  06/14/14 117.572 kg (259 lb 3.2 oz)     Intake/Output Summary (Last 24 hours) at 02/14/15 1250 Last data filed at 02/14/15 1145  Gross per 24 hour  Intake   1080 ml  Output   1226 ml  Net   -146 ml    Exam  General: Well developed, well nourished, NAD, appears stated age  25: NCAT,  mucous membranes moist.   Cardiovascular: S1 S2 auscultated, no rubs, murmurs or gallops. Regular rate and rhythm.  Respiratory: Diffuse expiratory wheezing  Abdomen: Soft, nontender, nondistended, + bowel sounds  Extremities: warm dry without cyanosis clubbing or edema  Neuro: AAOx3, nonfocal  Psych: Normal affect and demeanor with intact judgement and insight  Data Review   Micro Results No results found for this or any previous visit (from the past 240 hour(s)).  Radiology Reports Dg Chest 2 View  02/09/2015  CLINICAL DATA:  Shortness of breath and wheezing for 1 week. On diuresis for fluid on lungs, per physician. History of COPD, CHF, hypertension, atrial fibrillation. EXAM: CHEST  2 VIEW COMPARISON:  Chest x-rays dated 05/23/2014 and 08/15/2013. FINDINGS: Heart size is upper normal, stable. Overall cardiomediastinal silhouette remains normal in size and configuration. Mild atherosclerotic changes  again noted at the aortic arch. Lungs are clear. No evidence of pneumonia. No pleural effusions seen. No evidence of active congestive heart failure or pulmonary edema. No pneumothorax seen. Mild degenerative changes noted at the shoulders. Anterior cervical fusion hardware again noted within the lower cervical spine. Surgical clips again noted over the left lower chest wall. No acute osseous or soft tissue abnormality seen. IMPRESSION: Lungs are clear and there is no evidence of acute cardiopulmonary abnormality. No evidence of pneumonia. No evidence of congestive heart failure. Electronically Signed   By: Franki Cabot M.D.   On: 02/09/2015 14:22   Mr Shoulder Left Wo Contrast  02/12/2015  CLINICAL DATA:  Shoulder. History of rotator cuff tear. Prior surgery. EXAM: MRI OF THE LEFT SHOULDER WITHOUT CONTRAST TECHNIQUE: Multiplanar, multisequence MR imaging of the shoulder was performed. No intravenous contrast was administered. COMPARISON:  None. FINDINGS: Rotator cuff: Moderate tendinosis of the supraspinatus tendon with a partial thickness bursal surface tear 1.4 cm from the peripheral insertion. Small partial thickness articular surface tear of the anterior supraspinatus tendon. Small interstitial tear at the musculotendinous junction of the infraspinatus. Teres minor tendon is intact. Subscapularis tendon is intact. Muscles: No atrophy or fatty replacement of nor abnormal signal within, the muscles of the rotator cuff. Biceps long head: Severe tendinosis of the intraarticular portion of the long head of the biceps tendon. Acromioclavicular Joint: Moderate degenerative changes of the acromioclavicular joint. Small amount of subacromial/subdeltoid bursal fluid. Type I acromion. Glenohumeral Joint: No joint effusion.  No chondral defect. Labrum: Grossly intact, but evaluation is limited by lack of intraarticular fluid. Bones:  No marrow signal abnormality.  No fracture or dislocation. IMPRESSION: 1. Moderate  tendinosis of the supraspinatus tendon with a partial thickness bursal surface tear 1.4 cm from the peripheral insertion. Small partial thickness articular surface tear of the anterior supraspinatus tendon. 2. Small interstitial tear at the musculotendinous junction of the infraspinatus. 3. Severe tendinosis of the intraarticular portion of the long head of the biceps tendon. Electronically Signed   By: Kathreen Devoid   On: 02/12/2015 11:28   Dg Shoulder Left Port  02/11/2015  CLINICAL DATA:  Left shoulder pain.  No history of trauma. EXAM: LEFT SHOULDER - 1 VIEW COMPARISON:  None. FINDINGS: There is no evidence of fracture or dislocation. There is no evidence of arthropathy or other focal bone abnormality. Osseous mineralization is normal. Surgical clips are seen overlying the left lateral chest wall. Soft tissues are otherwise unremarkable. IMPRESSION: Negative. Electronically Signed   By: Fidela Salisbury M.D.   On: 02/11/2015 16:12    CBC  Recent Labs Lab 02/10/15 0347 02/11/15 0230 02/12/15 0335 02/13/15 0404 02/14/15 0416  WBC 7.7 8.4 8.5 6.9 6.5  HGB 12.4 12.1 12.2 11.9* 11.9*  HCT 40.0 38.2 38.9 36.9 37.5  PLT 260 260 280 245 246  MCV 95.2 95.5 94.9 95.1 94.2  MCH 29.5 30.3 29.8 30.7 29.9  MCHC 31.0 31.7 31.4 32.2 31.7  RDW 14.1 14.2 14.2 14.1 13.9    Chemistries   Recent Labs Lab 02/10/15 0347 02/11/15 0230 02/12/15 0335 02/13/15 0404 02/14/15 0416  NA 140 137 138 138 138  K 4.6 4.3 4.6 4.3 5.2*  CL 100* 99* 96* 98* 99*  CO2 29 26 29 27 27   GLUCOSE 136* 135* 140* 140* 141*  BUN 35* 48* 57* 59* 54*  CREATININE 1.51* 1.66* 1.75* 1.49* 1.28*  CALCIUM 9.3 9.0 9.0 8.3* 8.4*   ------------------------------------------------------------------------------------------------------------------ estimated creatinine clearance is 46.1 mL/min (by C-G formula based on Cr of  1.28). ------------------------------------------------------------------------------------------------------------------ No results for input(s): HGBA1C in the last 72 hours. ------------------------------------------------------------------------------------------------------------------ No results for input(s): CHOL, HDL, LDLCALC, TRIG, CHOLHDL, LDLDIRECT in the last 72 hours. ------------------------------------------------------------------------------------------------------------------ No results for input(s): TSH, T4TOTAL, T3FREE, THYROIDAB in the last 72 hours.  Invalid input(s): FREET3 ------------------------------------------------------------------------------------------------------------------ No results for input(s): VITAMINB12, FOLATE, FERRITIN, TIBC, IRON, RETICCTPCT in the last 72 hours.  Coagulation profile No results for input(s): INR, PROTIME in the last 168 hours.  No results for input(s): DDIMER in the last 72 hours.  Cardiac Enzymes No results for input(s): CKMB, TROPONINI, MYOGLOBIN in the last 168 hours.  Invalid input(s): CK ------------------------------------------------------------------------------------------------------------------ Invalid input(s): POCBNP    Jabri Blancett D.O. on 02/14/2015 at 12:50 PM  Between 7am to 7pm - Pager - [phone]  After 7pm go to [email or web address] - password [email or web address]  And look for the night coverage person covering for me after hours  [facility] Office  [phone]

## 2015-02-15 DIAGNOSIS — J9611 Chronic respiratory failure with hypoxia: Secondary | ICD-10-CM | POA: Diagnosis not present

## 2015-02-15 DIAGNOSIS — I5032 Chronic diastolic (congestive) heart failure: Secondary | ICD-10-CM | POA: Diagnosis not present

## 2015-02-15 DIAGNOSIS — J441 Chronic obstructive pulmonary disease with (acute) exacerbation: Secondary | ICD-10-CM | POA: Diagnosis not present

## 2015-02-15 DIAGNOSIS — N183 Chronic kidney disease, stage 3 (moderate): Secondary | ICD-10-CM | POA: Diagnosis not present

## 2015-02-15 DIAGNOSIS — M25512 Pain in left shoulder: Secondary | ICD-10-CM

## 2015-02-15 LAB — CBC
HCT: 35.7 % — ABNORMAL LOW (ref 36.0–46.0)
Hemoglobin: 11.5 g/dL — ABNORMAL LOW (ref 12.0–15.0)
MCH: 30.7 pg (ref 26.0–34.0)
MCHC: 32.2 g/dL (ref 30.0–36.0)
MCV: 95.2 fL (ref 78.0–100.0)
PLATELETS: 222 10*3/uL (ref 150–400)
RBC: 3.75 MIL/uL — AB (ref 3.87–5.11)
RDW: 14 % (ref 11.5–15.5)
WBC: 7.5 10*3/uL (ref 4.0–10.5)

## 2015-02-15 LAB — BASIC METABOLIC PANEL
Anion gap: 8 (ref 5–15)
BUN: 53 mg/dL — ABNORMAL HIGH (ref 6–20)
CALCIUM: 8.1 mg/dL — AB (ref 8.9–10.3)
CO2: 30 mmol/L (ref 22–32)
CREATININE: 1.29 mg/dL — AB (ref 0.44–1.00)
Chloride: 100 mmol/L — ABNORMAL LOW (ref 101–111)
GFR calc non Af Amer: 38 mL/min — ABNORMAL LOW (ref >60)
GFR, EST AFRICAN AMERICAN: 44 mL/min — AB (ref >60)
Glucose, Bld: 85 mg/dL (ref 65–99)
Potassium: 4.8 mmol/L (ref 3.5–5.1)
SODIUM: 138 mmol/L (ref 135–145)

## 2015-02-15 LAB — GLUCOSE, CAPILLARY
GLUCOSE-CAPILLARY: 94 mg/dL (ref 65–99)
Glucose-Capillary: 133 mg/dL — ABNORMAL HIGH (ref 65–99)

## 2015-02-15 MED ORDER — IPRATROPIUM-ALBUTEROL 0.5-2.5 (3) MG/3ML IN SOLN
3.0000 mL | Freq: Two times a day (BID) | RESPIRATORY_TRACT | Status: DC
  Administered 2015-02-15: 3 mL via RESPIRATORY_TRACT
  Filled 2015-02-15: qty 3

## 2015-02-15 MED ORDER — BENZONATATE 200 MG PO CAPS: 200.0000 mg | ORAL_CAPSULE | Freq: Three times a day (TID) | ORAL | Status: DC

## 2015-02-15 MED ORDER — FUROSEMIDE 20 MG PO TABS: 20.0000 mg | ORAL_TABLET | Freq: Every day | ORAL | Status: DC

## 2015-02-15 MED ORDER — CEFUROXIME AXETIL 500 MG PO TABS: 500.0000 mg | ORAL_TABLET | Freq: Two times a day (BID) | ORAL | Status: DC

## 2015-02-15 MED ORDER — PREDNISONE 10 MG PO TABS: ORAL_TABLET | ORAL | Status: DC

## 2015-02-15 NOTE — Care Management Important Message (Signed)
Important Message  Patient Details  Name: Jordan Byrd MRN: [id number] Date of Birth: 05-13-1933   Medicare Important Message Given:  Yes    [name] P Marnie Fazzino 02/15/2015, 1:30 PM

## 2015-02-15 NOTE — Discharge Instructions (Signed)
Respiratory failure is when your lungs are not working well and your breathing (respiratory) system fails. When respiratory failure occurs, it is difficult for your lungs to get enough oxygen, get rid of carbon dioxide, or both. Respiratory failure can be life threatening.  °Respiratory failure can be acute or chronic. Acute respiratory failure is sudden, severe, and requires emergency medical treatment. Chronic respiratory failure is less severe, happens over time, and requires ongoing treatment.  °WHAT ARE THE CAUSES OF ACUTE RESPIRATORY FAILURE?  °Any problem affecting the heart or lungs can cause acute respiratory failure. Some of these causes include the following: °· Chronic bronchitis and emphysema (COPD).   °· Blood clot going to a lung (pulmonary embolism).   °· Having water in the lungs caused by heart failure, lung injury, or infection (pulmonary edema).   °· Collapsed lung (pneumothorax).   °· Pneumonia.   °· Pulmonary fibrosis.   °· Obesity.   °· Asthma.   °· Heart failure.   °· Any type of trauma to the chest that can make breathing difficult.   °· Nerve or muscle diseases making chest movements difficult. °HOW WILL MY ACUTE RESPIRATORY FAILURE BE TREATED?  °Treatment of acute respiratory failure depends on the cause of the respiratory failure. Usually, you will stay in the intensive care unit so your breathing can be watched closely. Treatment can include the following: °· Oxygen. Oxygen can be delivered through the following: °· Nasal cannula. This is small tubing that goes in your nose to give you oxygen. °· Face mask. A face mask covers your nose and mouth to give you oxygen. °· Medicine. Different medicines can be given to help with breathing. These can include: °· Nebulizers. Nebulizers deliver medicines to open the air passages (bronchodilators). These medicines help to open or relax the airways in the lungs so you can breathe better. They can also help loosen mucus from your  lungs. °· Diuretics. Diuretic medicines can help you breathe better by getting rid of extra water in your body. °· Steroids. Steroid medicines can help decrease swelling (inflammation) in your lungs. °· Antibiotics. °· Chest tube. If you have a collapsed lung (pneumothorax), a chest tube is placed to help reinflate the lung. °· Noninvasive positive pressure ventilation (NPPV). This is a tight-fitting mask that goes over your nose and mouth. The mask has tubing that is attached to a machine. The machine blows air into the tubing, which helps to keep the tiny air sacs (alveoli) in your lungs open. This machine allows you to breathe on your own. °· Ventilator. A ventilator is a breathing machine. When on a ventilator, a breathing tube is put into the lungs. A ventilator is used when you can no longer breathe well enough on your own. You may have low oxygen levels or high carbon dioxide (CO2) levels in your blood. When you are on a ventilator, sedation and pain medicines are given to make you sleep so your lungs can heal. °SEEK IMMEDIATE MEDICAL CARE IF: °· You have shortness of breath (dyspnea) with or without activity. °· You have rapid breathing (tachypnea). °· You are wheezing. °· You are unable to say more than a few words without having to catch your breath. °· You find it very difficult to function normally. °· You have a fast heart rate. °· You have a bluish color to your finger or toe nail beds. °· You have confusion or drowsiness or both. °  °This information is not intended to replace advice given to you by your health care provider. Make sure you discuss   any questions you have with your health care provider. °  °Document Released: 01/25/2013 Document Revised: 10/11/2014 Document Reviewed: 01/25/2013 °Elsevier Interactive Patient Education ©2016 [employer] ° °Chronic Obstructive Pulmonary Disease °Chronic obstructive pulmonary disease (COPD) is a common lung condition in which airflow from the lungs is  limited. COPD is a general term that can be used to describe many different lung problems that limit airflow, including both chronic bronchitis and emphysema. If you have COPD, your lung function will probably never return to normal, but there are measures you can take to improve lung function and make yourself feel better. °CAUSES  °· Smoking (common). °· Exposure to secondhand smoke. °· Genetic problems. °· Chronic inflammatory lung diseases or recurrent infections. °SYMPTOMS °· Shortness of breath, especially with physical activity. °· Deep, persistent (chronic) cough with a large amount of thick mucus. °· Wheezing. °· Rapid breaths (tachypnea). °· Gray or bluish discoloration (cyanosis) of the skin, especially in your fingers, toes, or lips. °· Fatigue. °· Weight loss. °· Frequent infections or episodes when breathing symptoms become much worse (exacerbations). °· Chest tightness. °DIAGNOSIS °Your health care provider will take a medical history and perform a physical examination to diagnose COPD. Additional tests for COPD may include: °· Lung (pulmonary) function tests. °· Chest X-ray. °· CT scan. °· Blood tests. °TREATMENT  °Treatment for COPD may include: °· Inhaler and nebulizer medicines. These help manage the symptoms of COPD and make your breathing more comfortable. °· Supplemental oxygen. Supplemental oxygen is only helpful if you have a low oxygen level in your blood. °· Exercise and physical activity. These are beneficial for nearly all people with COPD. °· Lung surgery or transplant. °· Nutrition therapy to gain weight, if you are underweight. °· Pulmonary rehabilitation. This may involve working with a team of health care providers and specialists, such as respiratory, occupational, and physical therapists. °HOME CARE INSTRUCTIONS °· Take all medicines (inhaled or pills) as directed by your health care provider. °· Avoid over-the-counter medicines or cough syrups that dry up your airway (such as  antihistamines) and slow down the elimination of secretions unless instructed otherwise by your health care provider. °· If you are a smoker, the most important thing that you can do is stop smoking. Continuing to smoke will cause further lung damage and breathing trouble. Ask your health care provider for help with quitting smoking. He or she can direct you to community resources or hospitals that provide support. °· Avoid exposure to irritants such as smoke, chemicals, and fumes that aggravate your breathing. °· Use oxygen therapy and pulmonary rehabilitation if directed by your health care provider. If you require home oxygen therapy, ask your health care provider whether you should purchase a pulse oximeter to measure your oxygen level at home. °· Avoid contact with individuals who have a contagious illness. °· Avoid extreme temperature and humidity changes. °· Eat healthy foods. Eating smaller, more frequent meals and resting before meals may help you maintain your strength. °· Stay active, but balance activity with periods of rest. Exercise and physical activity will help you maintain your ability to do things you want to do. °· Preventing infection and hospitalization is very important when you have COPD. Make sure to receive all the vaccines your health care provider recommends, especially the pneumococcal and influenza vaccines. Ask your health care provider whether you need a pneumonia vaccine. °· Learn and use relaxation techniques to manage stress. °· Learn and use controlled breathing techniques as directed by your health   care provider. Controlled breathing techniques include: °¨ Pursed lip breathing. Start by breathing in (inhaling) through your nose for 1 second. Then, purse your lips as if you were going to whistle and breathe out (exhale) through the pursed lips for 2 seconds. °¨ Diaphragmatic breathing. Start by putting one hand on your abdomen just above your waist. Inhale slowly through your  nose. The hand on your abdomen should move out. Then purse your lips and exhale slowly. You should be able to feel the hand on your abdomen moving in as you exhale. °· Learn and use controlled coughing to clear mucus from your lungs. Controlled coughing is a series of short, progressive coughs. The steps of controlled coughing are: °1. Lean your head slightly forward. °2. Breathe in deeply using diaphragmatic breathing. °3. Try to hold your breath for 3 seconds. °4. Keep your mouth slightly open while coughing twice. °5. Spit any mucus out into a tissue. °6. Rest and repeat the steps once or twice as needed. °SEEK MEDICAL CARE IF: °· You are coughing up more mucus than usual. °· There is a change in the color or thickness of your mucus. °· Your breathing is more labored than usual. °· Your breathing is faster than usual. °SEEK IMMEDIATE MEDICAL CARE IF: °· You have shortness of breath while you are resting. °· You have shortness of breath that prevents you from: °¨ Being able to talk. °¨ Performing your usual physical activities. °· You have chest pain lasting longer than 5 minutes. °· Your skin color is more cyanotic than usual. °· You measure low oxygen saturations for longer than 5 minutes with a pulse oximeter. °MAKE SURE YOU: °· Understand these instructions. °· Will watch your condition. °· Will get help right away if you are not doing well or get worse. °  °This information is not intended to replace advice given to you by your health care provider. Make sure you discuss any questions you have with your health care provider. °  °Document Released: 10/30/2004 Document Revised: 02/10/2014 Document Reviewed: 09/16/2012 °Elsevier Interactive Patient Education ©2016 [employer] Inc. ° °

## 2015-02-15 NOTE — Care Management Note (Signed)
Case Management Note  Patient Details  Name: DRAKE CRANMER MRN: [id number] Date of Birth: [birth year]  Subjective/Objective:       COPD exacerbation, CHF             Action/Plan: NCM spoke to pt and states she is active with [facility]. Contacted [facility] to make aware of scheduled dc home with HH. Pt states she has oxygen at home. Grand-son brought portable tank from home. Contacted [facility] to make aware pt will need humidification for oxygen concentrator at home.   Expected Discharge Date:  02/15/2015               Expected Discharge Plan:  [place]  In-House Referral:  NA  Discharge planning Services  CM Consult  Post Acute Care Choice:  Home Health, Resumption of Svcs/PTA Provider Choice offered to:  Patient  DME Arranged:  N/A DME Agency:  NA  HH Arranged:  RN, PT, OT [place] Agency:  [facility]  Status of Service:  Completed, signed off  Medicare Important Message Given:  Yes Date Medicare IM Given:    Medicare IM give by:    Date Additional Medicare IM Given:    Additional Medicare Important Message give by:     If discussed at [employer] of Stay Meetings, dates discussed:    Additional Comments:  Erenest Rasher, RN 02/15/2015, 4:12 PM

## 2015-02-15 NOTE — Discharge Summary (Signed)
Physician Discharge Summary  Jordan Byrd [id number] DOB: [birth year] DOA: 02/09/2015  PCP: [name], MD  Admit date: 02/09/2015 Discharge date: 02/15/2015  Time spent: 45 minutes  Recommendations for Outpatient Follow-up:  Patient will be discharged to home with home health services, PT, OT, RN.  Patient will need to follow up with primary care provider within one week of discharge, repeat BMP.  Patient should continue medications as prescribed.  Patient should follow a heart healthy diet.   Discharge Diagnoses:  Acute on chronic respiratory failure with acute COPD exacerbation Chronic diastolic congestive heart failure Acute on chronic kidney disease, stage III Hypertension Paroxysmal atrial fibrillation Hyperglycemia Acute on chronic left shoulder pain  Discharge Condition: Stable  Diet recommendation: heart healthy  Filed Weights   02/13/15 0341 02/14/15 0611 02/15/15 0703  Weight: 115.6 kg (254 lb 13.6 oz) 116.075 kg (255 lb 14.4 oz) 116.1 kg (255 lb 15.3 oz)    History of present illness:  on 02/09/2015 by Dr. Christia Reading Opyd Jordan Byrd is a 82 y.o. female with PMH of COPD on home oxygen around the clock, chronic diastolic CHF, and paroxysmal atrial fibrillation on's her relative who presents to the ED for evaluation of progressively worsening dyspnea. Patient had been in her usual state of health until approximately 3 days ago when she developed difficulty catching her breath. Despite her home uses supplemental oxygen, she was desaturating down into the low 80s on her usual 2 L. There was an associated dry cough, and reported bilateral ankle swelling, but no orthopnea, chest pains, or palpitations. Patient denies any recent change in her diet or fluid intake. She discussed her condition over the phone with her PCP and it was recommended that she increase her Lasix. She has been doubling her daily Lasix dose, but with no appreciable effect on her respiratory status. Home  neb treatments have helped only slightly. Patient continues her daily Symbicort with albuterol MDI or neb as needed. She is been using 3 L/m supplemental oxygen for the last couple days.  In ED, patient was found to be afebrile, saturating well on 3 L/m nasal cannula, and with vital signs stable. Portable chest x-ray demonstrated clear lungs without edema or focal infiltrate. EKG demonstrated normal sinus rhythm, troponin was undetectable, and BNP came back at 41. Additional blood work was notable for a slight lump in her serum creatinine and mild hyperglycemia, but CBC was normal. Patient was given a DuoNeb treatment in the emergency department and while she did enjoy some subjective improvement, she desaturated to the low 80s on oxygen when ambulating. Patient was admitted to the hospital for ongoing evaluation and management of pain increasing supplemental O2 requirement with dyspnea and cough.  Hospital Course:  Acute on chronic respiratory failure with acute COPD exacerbation -Patient continues to have cough but states this improved. Her wheezing has improved as well.  -Patient feels she is close to her baseline -Continue nebs, steroids, antibiotics, Symbicort, flutter valve, antitussives -Will discharge patient with Ceftin and prednisone taper  Chronic diastolic congestive heart failure -Currently euvolemic and compensated -Echocardiogram 07/13/2012 EF 55-60%, left atrium mildly dilated -Continue to monitor intake and output, daily weights -Lasix and losartan held due to AKI- may resume at discharge -Continue nebivolol  Acute on chronic kidney disease, stage III -Resolved -Baseline creatinine 1.2-1.3, was 1.45 upon admission -Creatinine currently 1.29  Hypertension -Continue nebivolol, amlodipine -Lasix and losartan held, may resume at discharge (were held due to AKI)  Paroxysmal atrial fibrillation -Currently rate  and rhythm controlled -Continue flecainide and Xarelto -CHADSVASC  3-4  Hyperglycemia -Secondary to steroids, no history of diabetes mellitus  Acute on chronic left shoulder pain -Patient has had rotator cuff tear with surgeries in the past -Left shoulder x-ray negative -MRI showed moderate tendinosis of the supraspinatus tendon with small partial-thickness tear of the anterior supraspinatus tendon -Physical therapy consulted and recommended home health -Patient declined shoulder sling, she will need to follow-up with orthopedics at discharge  Procedures  None  Consults  None  Discharge Exam: Filed Vitals:   02/15/15 0703 02/15/15 0957  BP: 166/76 147/57  Pulse: 72   Temp: 97.6 F (36.4 C)   Resp: 18    Exam  General: Well developed, well nourished, NAD  HEENT: NCAT, mucous membranes moist.   Cardiovascular: S1 S2 auscultated, RRR, no murmurs  Respiratory: Diffuse expiratory wheezing  Abdomen: Soft, obese, nontender, nondistended, + bowel sounds  Extremities: warm dry without cyanosis clubbing or edema  Neuro: AAOx3, nonfocal  Psych: Normal affect and demeanor, pleasant  Discharge Instructions      Discharge Instructions    AMB Referral to [facility]    Complete by:  As directed   Reason for consult:  Post hospital follow up for monitoring and education - care management  Diagnoses of:   COPD/ Pneumonia Heart Failure    Expected date of contact:  1-3 days (reserved for hospital discharges)  Please assign to community nurse for transition of care calls and assess for home visits after hospital discharge. Consent form signed.  Her grandson, Jordan Byrd is her POA and can be reached at [phone].   Questions please call:  Natividad Brood, RN BSN [facility] Liaison  [phone] business mobile phone     Discharge instructions    Complete by:  As directed   Patient will be discharged to home with home health services, PT, OT, RN.  Patient will need to follow up with primary care provider  within one week of discharge, repeat BMP.  Patient should continue medications as prescribed. Patient should follow a heart healthy diet.            Medication List    TAKE these medications        acetaminophen 500 MG tablet  Commonly known as:  TYLENOL  Take 1,000 mg by mouth 2 (two) times daily.     albuterol (2.5 MG/3ML) 0.083% nebulizer solution  Commonly known as:  PROVENTIL  Take 3 mLs (2.5 mg total) by nebulization every 6 (six) hours as needed for wheezing or shortness of breath.     albuterol 108 (90 Base) MCG/ACT inhaler  Commonly known as:  PROVENTIL HFA;VENTOLIN HFA  Inhale 1 puff into the lungs every 6 (six) hours as needed for wheezing.     amLODipine 10 MG tablet  Commonly known as:  NORVASC  Take 10 mg by mouth daily.     benzonatate 200 MG capsule  Commonly known as:  TESSALON  Take 1 capsule (200 mg total) by mouth 3 (three) times daily.     budesonide-formoterol 160-4.5 MCG/ACT inhaler  Commonly known as:  SYMBICORT  Inhale 1 puff into the lungs See admin instructions. Inhale 1 puff every morning, may inhale a 2nd puff in the evening if needed for wheezing     BYSTOLIC 20 MG Tabs  Generic drug:  Nebivolol HCl  Take 20 mg by mouth daily.     cefUROXime 500 MG tablet  Commonly known as:  CEFTIN  Take 1 tablet (500 mg total) by mouth 2 (two) times daily with a meal.     flecainide 100 MG tablet  Commonly known as:  TAMBOCOR  Take 1 tablet (100 mg total) by mouth every 12 (twelve) hours.     furosemide 20 MG tablet  Commonly known as:  LASIX  Take 1 tablet (20 mg total) by mouth daily.     ICAPS PO  Take 1 tablet by mouth daily.     IRON PO  Take 5 mLs by mouth daily. Liquid Iron -     losartan 100 MG tablet  Commonly known as:  COZAAR  Take 100 mg by mouth daily.     multivitamin with minerals Tabs tablet  Take 1 tablet by mouth daily.     omeprazole 20 MG capsule  Commonly known as:  PRILOSEC  Take 20 mg by mouth daily.      potassium chloride 10 MEQ tablet  Commonly known as:  K-DUR,KLOR-CON  Take 1 tablet (10 mEq total) by mouth daily.     predniSONE 10 MG tablet  Commonly known as:  DELTASONE  Take 60mg  (6 tabs) x 2 days, then 50mg  (5 tabs) x 2 days, then 40mg  (4 tabs) x 2 days, then 30mg  (3 tabs) x 2days, then 20mg  (2 tabs) x2 days, then 10mg  (1 tab) x 2days.     rivaroxaban 20 MG Tabs tablet  Commonly known as:  XARELTO  Take 1 tablet (20 mg total) by mouth daily.       Allergies  Allergen Reactions  . Methocarbamol Hives  . Vicodin [Hydrocodone-Acetaminophen] Other (See Comments)    Interacts with bp medication and drops blood pressure  . Aspirin Nausea And Vomiting  . Butazolidin [Phenylbutazone] Other (See Comments)    Unknown reaction  . Celebrex [Celecoxib] Nausea And Vomiting  . Codeine Nausea And Vomiting  . Doripenem Rash  . Aspirin Buf(Alhyd-Mghyd-Cacar) Nausea And Vomiting  . Mucinex [Guaifenesin Er] Itching  . Temazepam Other (See Comments)    Unknown reaction   Follow-up Information    Follow up with GREEN, EDWIN JAY, MD. Schedule an appointment as soon as possible for a visit in 1 week.   Specialty:  Internal Medicine   Why:  Hospital follow up   Contact information:   [address] [place] [address] [phone]        The results of significant diagnostics from this hospitalization (including imaging, microbiology, ancillary and laboratory) are listed below for reference.    Significant Diagnostic Studies: Dg Chest 2 View  02/09/2015  CLINICAL DATA:  Shortness of breath and wheezing for 1 week. On diuresis for fluid on lungs, per physician. History of COPD, CHF, hypertension, atrial fibrillation. EXAM: CHEST  2 VIEW COMPARISON:  Chest x-rays dated 05/23/2014 and 08/15/2013. FINDINGS: Heart size is upper normal, stable. Overall cardiomediastinal silhouette remains normal in size and configuration. Mild atherosclerotic changes again noted at the aortic  arch. Lungs are clear. No evidence of pneumonia. No pleural effusions seen. No evidence of active congestive heart failure or pulmonary edema. No pneumothorax seen. Mild degenerative changes noted at the shoulders. Anterior cervical fusion hardware again noted within the lower cervical spine. Surgical clips again noted over the left lower chest wall. No acute osseous or soft tissue abnormality seen. IMPRESSION: Lungs are clear and there is no evidence of acute cardiopulmonary abnormality. No evidence of pneumonia. No evidence of congestive heart failure. Electronically Signed   By: Cherlynn Kaiser  Enriqueta Shutter M.D.   On: 02/09/2015 14:22   Mr Shoulder Left Wo Contrast  02/12/2015  CLINICAL DATA:  Shoulder. History of rotator cuff tear. Prior surgery. EXAM: MRI OF THE LEFT SHOULDER WITHOUT CONTRAST TECHNIQUE: Multiplanar, multisequence MR imaging of the shoulder was performed. No intravenous contrast was administered. COMPARISON:  None. FINDINGS: Rotator cuff: Moderate tendinosis of the supraspinatus tendon with a partial thickness bursal surface tear 1.4 cm from the peripheral insertion. Small partial thickness articular surface tear of the anterior supraspinatus tendon. Small interstitial tear at the musculotendinous junction of the infraspinatus. Teres minor tendon is intact. Subscapularis tendon is intact. Muscles: No atrophy or fatty replacement of nor abnormal signal within, the muscles of the rotator cuff. Biceps long head: Severe tendinosis of the intraarticular portion of the long head of the biceps tendon. Acromioclavicular Joint: Moderate degenerative changes of the acromioclavicular joint. Small amount of subacromial/subdeltoid bursal fluid. Type I acromion. Glenohumeral Joint: No joint effusion.  No chondral defect. Labrum: Grossly intact, but evaluation is limited by lack of intraarticular fluid. Bones:  No marrow signal abnormality.  No fracture or dislocation. IMPRESSION: 1. Moderate tendinosis of the  supraspinatus tendon with a partial thickness bursal surface tear 1.4 cm from the peripheral insertion. Small partial thickness articular surface tear of the anterior supraspinatus tendon. 2. Small interstitial tear at the musculotendinous junction of the infraspinatus. 3. Severe tendinosis of the intraarticular portion of the long head of the biceps tendon. Electronically Signed   By: Kathreen Devoid   On: 02/12/2015 11:28   Dg Shoulder Left Port  02/11/2015  CLINICAL DATA:  Left shoulder pain.  No history of trauma. EXAM: LEFT SHOULDER - 1 VIEW COMPARISON:  None. FINDINGS: There is no evidence of fracture or dislocation. There is no evidence of arthropathy or other focal bone abnormality. Osseous mineralization is normal. Surgical clips are seen overlying the left lateral chest wall. Soft tissues are otherwise unremarkable. IMPRESSION: Negative. Electronically Signed   By: Fidela Salisbury M.D.   On: 02/11/2015 16:12    Microbiology: No results found for this or any previous visit (from the past 240 hour(s)).   Labs: Basic Metabolic Panel:  Recent Labs Lab 02/11/15 0230 02/12/15 0335 02/13/15 0404 02/14/15 0416 02/15/15 0307  NA 137 138 138 138 138  K 4.3 4.6 4.3 5.2* 4.8  CL 99* 96* 98* 99* 100*  CO2 26 29 27 27 30   GLUCOSE 135* 140* 140* 141* 85  BUN 48* 57* 59* 54* 53*  CREATININE 1.66* 1.75* 1.49* 1.28* 1.29*  CALCIUM 9.0 9.0 8.3* 8.4* 8.1*   Liver Function Tests: No results for input(s): AST, ALT, ALKPHOS, BILITOT, PROT, ALBUMIN in the last 168 hours. No results for input(s): LIPASE, AMYLASE in the last 168 hours. No results for input(s): AMMONIA in the last 168 hours. CBC:  Recent Labs Lab 02/11/15 0230 02/12/15 0335 02/13/15 0404 02/14/15 0416 02/15/15 0307  WBC 8.4 8.5 6.9 6.5 7.5  HGB 12.1 12.2 11.9* 11.9* 11.5*  HCT 38.2 38.9 36.9 37.5 35.7*  MCV 95.5 94.9 95.1 94.2 95.2  PLT 260 280 245 246 222   Cardiac Enzymes: No results for input(s): CKTOTAL, CKMB,  CKMBINDEX, TROPONINI in the last 168 hours. BNP: BNP (last 3 results)  Recent Labs  05/13/14 0955 05/23/14 1252 02/09/15 1343  BNP 168.6* 40.0 41.2    ProBNP (last 3 results) No results for input(s): PROBNP in the last 8760 hours.  CBG:  Recent Labs Lab 02/14/15 [phone] 02/14/15 1142 02/14/15 1647 02/14/15 2158  02/15/15 0706  GLUCAP 124* 123* 125* 129* 94       Signed:  Cortni Tays  [facility] 02/15/2015, 10:21 AM

## 2015-02-16 ENCOUNTER — Other Ambulatory Visit: Payer: Self-pay

## 2015-02-16 NOTE — Patient Outreach (Signed)
Initial transition of care call: Placed call to patient and explained reason for call.  Patient reports that she had a hard time sleeping last night. Thinks that it is her albuterol. Reports that she has had a cough. Worse after laying down. ( thinks it is sinus drainage) Reports taking cough drops and cough medicine.   Reports that she her breathing is the same as when in hospital. On home oxygen at 2 liters nasal canula.  Reports interest in humidification for oxygen.  Patient reports that she has not yet heard from home health.  She was active with [facility] prior to admission.   Patient reports follow up with primary MD on 02/22/2015 at 8:30. Denies transportation concerns.  Reports grandson lives 2 houses down from patient.   Patient reports feeling weak but feels like she is able to care for herself without problems.  PLAN:  (1)Will notify MD of [facility] active with patient. Patient in transition of care program and will be outreach weekly and as needed. (2)Offered home visit and patient accepted for 02/27/2015 at 10:00. Confirmed address and provided my contact information. (3) Placed call to Dahlia Client with home health to confirm orders received. Awaiting call back.  THN CM Care Plan Problem One        Most Recent Value   Care Plan Problem One  Recent admission related to COPD/HF   Role Documenting the Problem One  Care Management [place] for Problem One  Active   THN Long Term Goal (31-90 days)  Patient will be able to verbalize no readmissions in the next 31 days.   THN Long Term Goal Start Date  02/16/15   Interventions for Problem One Long Term Goal  Reviewed discharge plan and medications. Offered and scheduled home visit. provided my contact information to patient.   THN CM Short Term Goal #1 (0-30 days)  Patient will verbalize seeing Primary MD as scheduled on 02/22/2015.   THN CM Short Term Goal #1 Start Date  02/16/15   Interventions for Short Term  Goal #1  Encouraged patient to ask any questions, Encouraged patient to discuss humdified oxygen with MD.   [facility] CM Short Term Goal #2 (0-30 days)  Patient will verbalize being active with [facility] in the nexr 7 days.   THN CM Short Term Goal #2 Start Date  02/16/15   Interventions for Short Term Goal #2  Confirmed order in Discharge plan. Placed call to [facility] and left message.     Jordan Rand, RN, BSN, CEN [facility] [employer] [phone]

## 2015-02-17 DIAGNOSIS — J45901 Unspecified asthma with (acute) exacerbation: Secondary | ICD-10-CM | POA: Diagnosis not present

## 2015-02-17 DIAGNOSIS — J449 Chronic obstructive pulmonary disease, unspecified: Secondary | ICD-10-CM | POA: Diagnosis not present

## 2015-02-17 DIAGNOSIS — J432 Centrilobular emphysema: Secondary | ICD-10-CM | POA: Diagnosis not present

## 2015-02-22 DIAGNOSIS — J449 Chronic obstructive pulmonary disease, unspecified: Secondary | ICD-10-CM | POA: Diagnosis not present

## 2015-02-23 ENCOUNTER — Other Ambulatory Visit: Payer: Self-pay

## 2015-02-23 NOTE — Patient Outreach (Addendum)
Transition of care call: Placed call to patient who reports that she is doing much better than last week. She reports that she breathing breathing and sleeping better.  No new problems to report.   Patient states that she decided not to let home health come see her because she did not want to pay the copay. Patient inquired if [facility] had a copay.  I reminded patient that [facility] services are free.   Plan: Confirmed home visit with patient for January 24th.  THN CM Care Plan Problem One        Most Recent Value   Care Plan Problem One  Recent admission related to COPD/HF   Role Documenting the Problem One  Care Management [place] for Problem One  Active   THN Long Term Goal (31-90 days)  Patient will be able to verbalize no readmissions in the next 31 days.   THN Long Term Goal Start Date  02/16/15   Interventions for Problem One Long Term Goal  confirmed home visit,    THN CM Short Term Goal #1 (0-30 days)  Patient will verbalize seeing Primary MD as scheduled on 02/22/2015.   THN CM Short Term Goal #1 Start Date  02/16/15   Interventions for Short Term Goal #1  Encouraged patient to ask any questions, Encouraged patient to discuss humdified oxygen with MD.   [facility] CM Short Term Goal #2 (0-30 days)  Patient will verbalize being active with [facility] in the nexr 7 days.   THN CM Short Term Goal #2 Start Date  02/16/15   [facility] CM Short Term Goal #2 Met Date  02/23/15 [goal met. [facility] contacted patient. she refused]   Interventions for Short Term Goal #2  Confirmed order in Discharge plan. Placed call to [facility] and left message.      Tomasa Rand, RN, BSN, CEN [facility] [employer] [phone]

## 2015-02-26 DIAGNOSIS — J449 Chronic obstructive pulmonary disease, unspecified: Secondary | ICD-10-CM | POA: Diagnosis not present

## 2015-02-26 DIAGNOSIS — J45901 Unspecified asthma with (acute) exacerbation: Secondary | ICD-10-CM | POA: Diagnosis not present

## 2015-02-26 DIAGNOSIS — J432 Centrilobular emphysema: Secondary | ICD-10-CM | POA: Diagnosis not present

## 2015-02-26 DIAGNOSIS — I5020 Unspecified systolic (congestive) heart failure: Secondary | ICD-10-CM | POA: Diagnosis not present

## 2015-02-27 ENCOUNTER — Other Ambulatory Visit: Payer: Self-pay

## 2015-02-27 VITALS — BP 120/62 | HR 64 | Resp 20 | Ht 66.0 in | Wt 248.0 lb

## 2015-02-27 DIAGNOSIS — J441 Chronic obstructive pulmonary disease with (acute) exacerbation: Secondary | ICD-10-CM

## 2015-02-27 NOTE — Patient Outreach (Signed)
[facility]) Care Management   02/27/2015  Jordan Byrd Dec 04, 1933 [id number]  Jordan Byrd is an 82 y.o. female 10:00 Arrived for scheduled home visit.  Patient answered door without oxygen.  Very winded when returning to her chair.  Subjective: Patient reports that she is doing much better since hospital discharge. Patient does report decreased energy levels.  Reports finished antibiotic and prednisone.  Patient reports that she is easily winded whenever she does " anything".  Reports walking to bathroom makes her short of breath.  Patient reports frequent rest period needed when making her bed or taking a shower. Patient reports that she takes her time. Patient takes off her oxygen when she moves about the house because of fear of falling over oxygen tubing.  Patient reports that she does not wear her oxygen at night because she is afraid she will fall if she gets up at night to go to bathroom.  Patient reports that she uses her albuterol neb only once a day because she can not sleep if she uses if more often. Does not use her inhaler. States that the inhaler is in the drawer in the kitchen.  Patient admits that she refused advanced home health care nursing because of the 25.00 co pay per visit. States that she can see her primary MD for 10.00 per visit.  Patient reports that she struggles with the cost of her medication when she hits the doughnut hole.( usually in June)  Reports that xarelto and symbicort at the most expensive. Patient currently lives alone. Grandson lives next door and assist patient with food, errands, and MD appointments.  Patient reports that she goes out to lunch 2 days a week and goes to the beauty shop once a week other wise patient is at home. Reports that she does not weigh daily but that is her choice. She reports that she use to weigh daily but weight was stable and chose not to weigh anymore.   Patient reports long term plan is to stay at her home with  care provided by family members.    Objective:   Awake and alert. Appears happy with current home status. Very winded when walking in home without oxygen. Home is well kept and clean. No tripping hazards noted except oxygen tubing. Patient holds onto wall when walking in home due to pain in the left knee.  Filed Vitals:   02/27/15 1008  BP: 120/62  Pulse: 64  Resp: 20  Height: 1.676 m ('5\' 6"'$ )  Weight: 248 lb (112.492 kg)  SpO2: 98%   Review of Systems  Respiratory: Positive for cough, shortness of breath and wheezing.   Cardiovascular: Positive for leg swelling.  Gastrointestinal: Negative.   Genitourinary: Negative.   Musculoskeletal:       Fears falling  Neurological: Positive for weakness.  Endo/Heme/Allergies: Negative.   Psychiatric/Behavioral: Negative.     Physical Exam  Constitutional: She is oriented to person, place, and time. She appears well-developed and well-nourished.  Cardiovascular: Normal rate.   Respiratory: Effort normal. She has wheezes.  Expiratory wheezing noted billaterally  GI: Soft. Bowel sounds are normal.  Musculoskeletal: She exhibits edema.  Trace edema to both lower exteremities. Wearing knee high hose.  Neurological: She is alert and oriented to person, place, and time.  Skin: Skin is warm and dry.  Feet without sores or lesions. No broken skin  Psychiatric: She has a normal mood and affect. Her behavior is normal. Judgment and thought content normal.  Current Medications:   Current Outpatient Prescriptions  Medication Sig Dispense Refill  . acetaminophen (TYLENOL) 500 MG tablet Take 1,000 mg by mouth 2 (two) times daily.    Marland Kitchen albuterol (PROVENTIL) (2.5 MG/3ML) 0.083% nebulizer solution Take 3 mLs (2.5 mg total) by nebulization every 6 (six) hours as needed for wheezing or shortness of breath. 75 mL 5  . amLODipine (NORVASC) 10 MG tablet Take 10 mg by mouth daily.    . benzonatate (TESSALON) 200 MG capsule Take 1 capsule (200 mg total) by  mouth 3 (three) times daily. 20 capsule 0  . budesonide-formoterol (SYMBICORT) 160-4.5 MCG/ACT inhaler Inhale 1 puff into the lungs See admin instructions. Inhale 1 puff every morning, may inhale a 2nd puff in the evening if needed for wheezing    . flecainide (TAMBOCOR) 100 MG tablet Take 1 tablet (100 mg total) by mouth every 12 (twelve) hours. 180 tablet 3  . furosemide (LASIX) 20 MG tablet Take 1 tablet (20 mg total) by mouth daily. 30 tablet 0  . IRON PO Take 5 mLs by mouth daily. Reported on 02/16/2015    . losartan (COZAAR) 100 MG tablet Take 100 mg by mouth daily.    . Multiple Vitamin (MULTIVITAMIN WITH MINERALS) TABS tablet Take 1 tablet by mouth daily.    . Multiple Vitamins-Minerals (ICAPS PO) Take 1 tablet by mouth daily. Reported on 02/16/2015    . Nebivolol HCl (BYSTOLIC) 20 MG TABS Take 20 mg by mouth daily.     Marland Kitchen omeprazole (PRILOSEC) 20 MG capsule Take 20 mg by mouth daily.    . potassium chloride (K-DUR,KLOR-CON) 10 MEQ tablet Take 1 tablet (10 mEq total) by mouth daily. 30 tablet 0  . rivaroxaban (XARELTO) 20 MG TABS tablet Take 1 tablet (20 mg total) by mouth daily. (Patient taking differently: Take 20 mg by mouth daily with breakfast. ) 90 tablet 3  . albuterol (PROVENTIL HFA;VENTOLIN HFA) 108 (90 BASE) MCG/ACT inhaler Inhale 1 puff into the lungs every 6 (six) hours as needed for wheezing. Reported on 02/27/2015    . cefUROXime (CEFTIN) 500 MG tablet Take 1 tablet (500 mg total) by mouth 2 (two) times daily with a meal. (Patient not taking: Reported on 02/27/2015) 4 tablet 0  . predniSONE (DELTASONE) 10 MG tablet Take 48m (6 tabs) x 2 days, then 522m(5 tabs) x 2 days, then 4078m4 tabs) x 2 days, then 74m28m tabs) x 2days, then 20mg72mtabs) x2 days, then 10mg 48mab) x 2days. (Patient not taking: Reported on 02/27/2015) 42 tablet 0   No current facility-administered medications for this visit.    Functional Status:   In your present state of health, do you have any  difficulty performing the following activities: 02/27/2015 02/13/2015  Hearing? N N  Vision? Y N  Difficulty concentrating or making decisions? N N  Walking or climbing stairs? Y Y  Dressing or bathing? Y N  Doing errands, shopping? Y N  Preparing Food and eating ? N -  Using the Toilet? N -  In the past six months, have you accidently leaked urine? Y -  Do you have problems with loss of bowel control? N -  Managing your Medications? N -  Managing your Finances? N -  Housekeeping or managing your Housekeeping? N -    Fall/Depression Screening:    PHQ 2/9 Scores 02/27/2015 12/25/2014  PHQ - 2 Score 0 0   Fall Risk  02/27/2015  Falls in the past year?  No   Assessment:   (1) reviewed purpose of THN. Reviewed consent obtained in the hospital. Patient denies any needed changes.  Patient does not recall getting new patient folder therefore folder provided at this home visit including non discrimination handout. Provided call a nurse magnet and my contact information. (2) shortness of breath with any activity. Not wearing oxygen when moving from room to room. (3) fear of falling (4) not weighing daily. (5) has completed advanced directives. (6) Medication cost concerns and medication side effect concern ( albuterol)  Plan:  (1) Consent valid and in chart. (2) Encouraged patient to do a trial of wearing oxygen throughout home when moving from room to room. Patient has agreed to try. Reviewed COPD zones and provide COPD zone magnet for refrigerator, Provided COPD EMMI education. Encouraged patient to call MD for any new symptoms or worsening shortness of breath (3) Reviewed safety precautions including wearing non slip socks. Encouraged patient to use assistive devices if needed.  (4) Encouraged patient to weigh twice a week and record. Reviewed heart failure zones. Reviewed low salt diet and provided low salt diet tear off.  (5) Encouraged patient to continue to update family with her  wishes. (6) referral to [place] for medication assistance and side effect of albuterol.  Collaborative goal setting and care planning during home visit. Primary goal is to avoid readmission. Patient is active in the transition of care program Next outreach planned by phone on 1/31. Next home visit planned for 03/26/2015  [facility] CM Care Plan Problem One        Most Recent Value   Care Plan Problem One  Recent admission related to COPD/HF   Role Documenting the Problem One  Care Management [place] for Problem One  Active   THN Long Term Goal (31-90 days)  Patient will be able to verbalize no readmissions in the next 31 days.   THN Long Term Goal Start Date  02/16/15   Interventions for Problem One Long Term Goal  home visit completed. Provided 24 hour call a nurse line. Provided my contact information.   THN CM Short Term Goal #1 (0-30 days)  Patient will verbalize seeing Primary MD as scheduled on 02/22/2015.   THN CM Short Term Goal #1 Start Date  02/16/15   [facility] CM Short Term Goal #1 Met Date  02/27/15 Barrie Folk met]   Interventions for Short Term Goal #1  Encouraged patient to ask any questions, Encouraged patient to discuss humdified oxygen with MD.   [facility] CM Short Term Goal #2 (0-30 days)  Patient will verbalize being active with [place] in the nexr 7 days.   THN CM Short Term Goal #2 Start Date  02/16/15   [facility] CM Short Term Goal #2 Met Date  02/23/15 [goal met. Advanced contacted patient. she refused]   Interventions for Short Term Goal #2  Confirmed order in Discharge plan. Placed call to [facility] and left message.    THN CM Care Plan Problem Two        Most Recent Value   Care Plan Problem Two  Knowledge deficit related to daily COPD managment.   Role Documenting the Problem Two  Care Management Coordinator   Care Plan for Problem Two  Active   Interventions for Problem Two Long Term Goal   Encouarged patient to try rolling oxygen tubing in a circle  when she walks in the home. Encouraged patient to walk slowly. Encouraged use of  non slip socks   THN Long Term Goal (31-90) days  Patient will report no falls from tripping over oxygen cord in the next 31 days.   THN Long Term Goal Start Date  02/27/15   THN CM Short Term Goal #1 (0-30 days)  Patient will weigh and record weights atleast 2 times per week for the next 30 days.   THN CM Short Term Goal #1 Start Date  02/27/15   Interventions for Short Term Goal #2   Reviewed COPD zones, reviewed CHF zones. Reviewed importance of weight and pararmenter to call MD to report weight gain.    Jordan Rand, RN, BSN, CEN [facility] [employer] [phone]

## 2015-03-05 ENCOUNTER — Other Ambulatory Visit: Payer: Self-pay | Admitting: Oncology

## 2015-03-05 DIAGNOSIS — Z853 Personal history of malignant neoplasm of breast: Secondary | ICD-10-CM

## 2015-03-06 ENCOUNTER — Other Ambulatory Visit: Payer: Self-pay

## 2015-03-06 ENCOUNTER — Encounter: Payer: Self-pay | Admitting: Pharmacist

## 2015-03-06 ENCOUNTER — Other Ambulatory Visit: Payer: Self-pay | Admitting: Pharmacist

## 2015-03-06 ENCOUNTER — Ambulatory Visit: Payer: Self-pay

## 2015-03-06 NOTE — Patient Outreach (Signed)
[facility]) Care Management  [place]   03/06/2015  JULIENNE GUNDY May 07, 1933 [id number]  Subjective: Jordan Byrd is a 82yo referred to [place] from [place] for medication review and assistance.  I made outreach call to patient to discuss medication assistance.  I was unable to reach patient.  There was no answer on the home phone and no option to leave a voicemail.  Patient's cell phone was not currently accepting calls.    Objective:   Current Medications: Current Outpatient Prescriptions  Medication Sig Dispense Refill  . acetaminophen (TYLENOL) 500 MG tablet Take 1,000 mg by mouth 2 (two) times daily.    Marland Kitchen albuterol (PROVENTIL HFA;VENTOLIN HFA) 108 (90 BASE) MCG/ACT inhaler Inhale 1 puff into the lungs every 6 (six) hours as needed for wheezing. Reported on 02/27/2015    . albuterol (PROVENTIL) (2.5 MG/3ML) 0.083% nebulizer solution Take 3 mLs (2.5 mg total) by nebulization every 6 (six) hours as needed for wheezing or shortness of breath. 75 mL 5  . amLODipine (NORVASC) 10 MG tablet Take 10 mg by mouth daily.    . benzonatate (TESSALON) 200 MG capsule Take 1 capsule (200 mg total) by mouth 3 (three) times daily. 20 capsule 0  . budesonide-formoterol (SYMBICORT) 160-4.5 MCG/ACT inhaler Inhale 1 puff into the lungs See admin instructions. Inhale 1 puff every morning, may inhale a 2nd puff in the evening if needed for wheezing    . cefUROXime (CEFTIN) 500 MG tablet Take 1 tablet (500 mg total) by mouth 2 (two) times daily with a meal. (Patient not taking: Reported on 02/27/2015) 4 tablet 0  . flecainide (TAMBOCOR) 100 MG tablet Take 1 tablet (100 mg total) by mouth every 12 (twelve) hours. 180 tablet 3  . furosemide (LASIX) 20 MG tablet Take 1 tablet (20 mg total) by mouth daily. 30 tablet 0  . IRON PO Take 5 mLs by mouth daily. Reported on 02/16/2015    . losartan (COZAAR) 100 MG tablet Take 100 mg by mouth daily.    . Multiple Vitamin  (MULTIVITAMIN WITH MINERALS) TABS tablet Take 1 tablet by mouth daily.    . Multiple Vitamins-Minerals (ICAPS PO) Take 1 tablet by mouth daily. Reported on 02/16/2015    . Nebivolol HCl (BYSTOLIC) 20 MG TABS Take 20 mg by mouth daily.     Marland Kitchen omeprazole (PRILOSEC) 20 MG capsule Take 20 mg by mouth daily.    . potassium chloride (K-DUR,KLOR-CON) 10 MEQ tablet Take 1 tablet (10 mEq total) by mouth daily. 30 tablet 0  . predniSONE (DELTASONE) 10 MG tablet Take 60mg  (6 tabs) x 2 days, then 50mg  (5 tabs) x 2 days, then 40mg  (4 tabs) x 2 days, then 30mg  (3 tabs) x 2days, then 20mg  (2 tabs) x2 days, then 10mg  (1 tab) x 2days. (Patient not taking: Reported on 02/27/2015) 42 tablet 0  . rivaroxaban (XARELTO) 20 MG TABS tablet Take 1 tablet (20 mg total) by mouth daily. (Patient taking differently: Take 20 mg by mouth daily with breakfast. ) 90 tablet 3   No current facility-administered medications for this visit.   Functional Status: In your present state of health, do you have any difficulty performing the following activities: 02/27/2015 02/13/2015  Hearing? N N  Vision? Y N  Difficulty concentrating or making decisions? N N  Walking or climbing stairs? Y Y  Dressing or bathing? Y N  Doing errands, shopping? Y N  Preparing Food and eating ? N -  Using the Toilet? N -  In the past six months, have you accidently leaked urine? Y -  Do you have problems with loss of bowel control? N -  Managing your Medications? N -  Managing your Finances? N -  Housekeeping or managing your Housekeeping? N -   Fall/Depression Screening: PHQ 2/9 Scores 02/27/2015 12/25/2014  PHQ - 2 Score 0 0    Assessment: 1.  Medication Review:    Drugs sorted by system:  Neurologic/Psychologic: none  Cardiovascular: amlodipine, flecainide, furosemide, losartan, nebivolol, potassium, rivaroxaban  Pulmonary/Allergy: albuterol HFA and nebulizer, benzonatate, budesonide-formoterol (Symbicort)  Gastrointestinal:  omeprazole  Endocrine: none  Renal: none  Topical: none  Pain: acetaminophen  Vitamins/Minerals: ferrous sulfate, multivitamin  Infectious Diseases: cefuroxime  Miscellaneous: prednisone   Duplications in therapy: none noted Gaps in therapy: none noted Medications to avoid in the elderly: omeprazole (risk of Clostridium difficile infection and bone loss and fractures) Drug interactions: none noted Other issues noted: none - patient on medications that can elevate potassium and on a potassium supplement. Last potassium = 4.8 (02/15/15)  2.  Medication assistance:  I was unable to contact patient for assessment.    Plan: 1.  Medication review:  All medications appear appropriate based on patient's problem list.  Please use caution with medication on the beers list and continue to weigh risk versus benefit of use of proton pump inhibitor for greater than 8 weeks.  Will send a fax to patient's primary care provider with this information.  No other issues noted.   2.  Medication assistance:  I will attempt second outreach call within one week to assess medication assistance needs.    Elisabeth Most, Pharm.D. Pharmacy Resident [place] [phone]

## 2015-03-06 NOTE — Patient Outreach (Signed)
Transition of care call: Spoke with patient today who reports she is "about the same"  States she used her neb this morning. Reports her grandson is with her if she needs anything.  States that she is weighing twice a week with weight of 255 to 256 pounds. Denies weight gain. Denies any increase in swelling.  Plan: Will continue transition of care calls weekly. Encouraged patient to call MD if she has a change in symptoms. She agreed.  Tomasa Rand, RN, BSN, CEN [facility] [employer] [phone]

## 2015-03-09 ENCOUNTER — Other Ambulatory Visit: Payer: Self-pay | Admitting: Pharmacist

## 2015-03-09 ENCOUNTER — Encounter: Payer: Self-pay | Admitting: Pharmacist

## 2015-03-09 NOTE — Patient Outreach (Signed)
[facility]) Care Management  [place]   03/09/2015  BISAN BLIGHT 1933-04-23 [id number]  Subjective: Rheva Ventrice is a 82yo referred to [place] from [place] for medication review and assistance. I made outreach call to patient.  I reviewed all medications with patient and patient reports compliance with medications.  Discussed purpose, proper use, and adverse effects of medications.  I reviewed difference between maintenance and rescue inhalers and discussed importance of using maintenance inhalers every day as prescribed even if her breathing is good.  Patient reports having insomnia with albuterol and states she only uses it once daily in the morning due to this adverse effect.    Also discussed medication assistance with patient.  Patient denies any difficulty affording medications at this time.  She states she has difficulty affording Xarelto and Symbicort when she is in the donut hole.  She reports her provider gives her samples of Symbicort at times to help her when she is in the donut hole.    Objective:   Current Medications: Current Outpatient Prescriptions  Medication Sig Dispense Refill  . acetaminophen (TYLENOL) 500 MG tablet Take 1,000 mg by mouth 2 (two) times daily.    Marland Kitchen albuterol (PROVENTIL HFA;VENTOLIN HFA) 108 (90 BASE) MCG/ACT inhaler Inhale 1 puff into the lungs every 6 (six) hours as needed for wheezing. Reported on 02/27/2015    . albuterol (PROVENTIL) (2.5 MG/3ML) 0.083% nebulizer solution Take 3 mLs (2.5 mg total) by nebulization every 6 (six) hours as needed for wheezing or shortness of breath. 75 mL 5  . amLODipine (NORVASC) 10 MG tablet Take 10 mg by mouth daily.    . budesonide-formoterol (SYMBICORT) 160-4.5 MCG/ACT inhaler Inhale 1 puff into the lungs See admin instructions. Inhale 1 puff every morning, may inhale a 2nd puff in the evening if needed for wheezing    . flecainide (TAMBOCOR) 100 MG tablet Take 1 tablet  (100 mg total) by mouth every 12 (twelve) hours. 180 tablet 3  . furosemide (LASIX) 20 MG tablet Take 1 tablet (20 mg total) by mouth daily. 30 tablet 0  . IRON PO Take 5 mLs by mouth daily. Reported on 02/16/2015    . losartan (COZAAR) 100 MG tablet Take 100 mg by mouth daily.    . Multiple Vitamin (MULTIVITAMIN WITH MINERALS) TABS tablet Take 1 tablet by mouth daily.    . Nebivolol HCl (BYSTOLIC) 20 MG TABS Take 20 mg by mouth daily.     Marland Kitchen omeprazole (PRILOSEC) 20 MG capsule Take 20 mg by mouth daily.    . potassium chloride (K-DUR,KLOR-CON) 10 MEQ tablet Take 1 tablet (10 mEq total) by mouth daily. 30 tablet 0  . rivaroxaban (XARELTO) 20 MG TABS tablet Take 1 tablet (20 mg total) by mouth daily. (Patient taking differently: Take 20 mg by mouth daily with breakfast. ) 90 tablet 3  . benzonatate (TESSALON) 200 MG capsule Take 1 capsule (200 mg total) by mouth 3 (three) times daily. (Patient not taking: Reported on 03/09/2015) 20 capsule 0   No current facility-administered medications for this visit.    Functional Status: In your present state of health, do you have any difficulty performing the following activities: 02/27/2015 02/13/2015  Hearing? N N  Vision? Y N  Difficulty concentrating or making decisions? N N  Walking or climbing stairs? Y Y  Dressing or bathing? Y N  Doing errands, shopping? Y N  Preparing Food and eating ? N -  Using  the Toilet? N -  In the past six months, have you accidently leaked urine? Y -  Do you have problems with loss of bowel control? N -  Managing your Medications? N -  Managing your Finances? N -  Housekeeping or managing your Housekeeping? N -    Fall/Depression Screening: PHQ 2/9 Scores 02/27/2015 12/25/2014  PHQ - 2 Score 0 0    Assessment: 1.  Medication assistance:  Discussed Extra Help with patient and patient reports that she has applied in the past and has been denied Extra Help.  Also discussed patient assistance programs for Symbicort and  Xarelto.  Symbicort patient assistance program requires patients to spend 3% of their annual income on prescription medications prior to eligibility and Xarelto patient assistance programs also requires for out of pocket costs be submitted.  Patient reports she has not spent 3% out of pocket yet this year and therefore would not be eligible at this time.  Discussed applying for patient assistance program when she reaches the donut hole.       2.  Medication management:  Patient reports insomnia with albuterol use.  Patient reports she will not use the medication except in the morning because she does not want the medication to interfere with her sleep.  Reviewed purpose, proper use, and adverse effects of albuterol.  Discussed with patient the importance of using albuterol if she is short of breath.  Albuterol has a 1 to 3% incidence of insomnia.  Could consider trial of levalbuterol in place of albuterol.    Plan: 1.  Will send patient a letter with my contact information.  Counseled patient to reach out to me in the future if she reaches the donut hole and has difficulty affording her medications.  2.  Will send patient's provider, Dr. [name], a letter regarding patient's report of insomnia with albuterol and recommendation to consider trial of levalbuterol in place of albuterol.   3.  Patient assessed and patient denies any pharmacy needs at this time.  Will close pharmacy program.  Will alert [place].     Elisabeth Most, Pharm.D. Pharmacy Resident [place] [phone]

## 2015-03-13 ENCOUNTER — Other Ambulatory Visit: Payer: Self-pay

## 2015-03-13 NOTE — Patient Outreach (Signed)
Transition of care call: Placed call to patient who reports that she is doing " about the same" states that she has the normal amounts of shortness of breath. States that she is currently sitting in her chair.   Reports that she spoke with pharmacist and is now taking Symbicort twice a day instead of albuterol nebulizer once a day.  Reports this is day 2 of her new regimen of COPD meds.  Patient reports that she went into kitchen to sit down and missed the chair. States that she had to call grandson Edd Arbour to come get her out of the floor. Denies injury.  Plan: Will continue transition of care call for 1 more week and home visit planned for 03/26/2015 Reminded patient of pending home visit.  Tomasa Rand, RN, BSN, CEN [facility] [employer] [phone]

## 2015-03-20 ENCOUNTER — Other Ambulatory Visit: Payer: Self-pay

## 2015-03-20 ENCOUNTER — Ambulatory Visit: Payer: PPO

## 2015-03-20 DIAGNOSIS — J449 Chronic obstructive pulmonary disease, unspecified: Secondary | ICD-10-CM | POA: Diagnosis not present

## 2015-03-20 DIAGNOSIS — J45901 Unspecified asthma with (acute) exacerbation: Secondary | ICD-10-CM | POA: Diagnosis not present

## 2015-03-20 DIAGNOSIS — J432 Centrilobular emphysema: Secondary | ICD-10-CM | POA: Diagnosis not present

## 2015-03-20 NOTE — Patient Outreach (Signed)
Final transition of care call: Spoke with patient who reports that she is having a good day with her breathing. Reports that she has good days and bad.  No new problems to report.  PLAN:  Reminded patient of home visit planned for 03/26/2015.  Patient confirmed appointment. Patient has successfully completed transition of care program without a readmission.  Tomasa Rand, RN, BSN, CEN [facility] [employer] [phone]

## 2015-03-23 ENCOUNTER — Ambulatory Visit
Admission: RE | Admit: 2015-03-23 | Discharge: 2015-03-23 | Disposition: A | Discharge status: Home / Self Care | Payer: PPO | Source: Ambulatory Visit | Attending: Oncology | Admitting: Oncology

## 2015-03-23 DIAGNOSIS — Z853 Personal history of malignant neoplasm of breast: Secondary | ICD-10-CM

## 2015-03-23 DIAGNOSIS — R928 Other abnormal and inconclusive findings on diagnostic imaging of breast: Secondary | ICD-10-CM | POA: Diagnosis not present

## 2015-03-26 ENCOUNTER — Other Ambulatory Visit: Payer: Self-pay

## 2015-03-26 NOTE — Patient Outreach (Signed)
[facility]) Care Management   03/26/2015  Jordan Byrd 1934/01/12 [id number]  Jordan Byrd is an 82 y.o. female 0930 am  Arrived for home visit. Patient was washing dishes in kitchen. Subjective: Patient reports that she is doing much better with her breathing. Reports that she had one day last week that she felt the best that she has felt in a very long time. Reports she occasionally has these days. Patient reports that she has been having pain in her left ankle. States that she has "turned it over" several times in the last several months. Reports that she is going to see Dr. [name] about her ankle tomorrow. Patient reports that she has all of her medications and is taking her medications without problems. Reports that she only has problems with her medications when she is in the doughnut hole. Patient reports weighing occasionally. States that her weight does not change and she is no longer interested in working on recording daily weights as a goal. Reports that she has weighed about 2-3 times in a month.  Objective:  Holding onto furniture and walls when walking. Limping in pain to the left ankle. Filed Vitals:   03/26/15 0931  BP: 152/78  Pulse: 82  Resp: 20  Weight: 257 lb (116.574 kg)  SpO2: 98%   Review of Systems  HENT: Negative.   Eyes: Negative.   Respiratory: Positive for shortness of breath.        Shortness of breath with activity  Cardiovascular: Negative.   Gastrointestinal: Negative.   Genitourinary: Negative.   Musculoskeletal: Positive for joint pain.       Reports severe left ankle pain. No new injury  Skin: Negative.   Neurological: Positive for weakness.  Endo/Heme/Allergies: Negative.   Psychiatric/Behavioral: Negative.     Physical Exam  Constitutional: She is oriented to person, place, and time. She appears well-developed and well-nourished.  Cardiovascular: Normal rate.   Respiratory: Effort normal and breath sounds normal. No  respiratory distress. She has no wheezes. She has no rales. She exhibits no tenderness.  Lungs clear today. Talking in complete sentences. No distress noted.  GI: Soft. Bowel sounds are normal.  Musculoskeletal: She exhibits edema.  Right leg more than left. 1 plus, Left ankle tender with  Flexion and with rotation. Tender left ankle with palpation. No edema to ankle  Neurological: She is alert and oriented to person, place, and time.  Skin: Skin is warm and dry.  Psychiatric: She has a normal mood and affect. Her behavior is normal. Judgment and thought content normal.    Current Medications:   Current Outpatient Prescriptions  Medication Sig Dispense Refill  . acetaminophen (TYLENOL) 500 MG tablet Take 1,000 mg by mouth 2 (two) times daily.    Marland Kitchen albuterol (PROVENTIL HFA;VENTOLIN HFA) 108 (90 BASE) MCG/ACT inhaler Inhale 1 puff into the lungs every 6 (six) hours as needed for wheezing. Reported on 02/27/2015    . albuterol (PROVENTIL) (2.5 MG/3ML) 0.083% nebulizer solution Take 3 mLs (2.5 mg total) by nebulization every 6 (six) hours as needed for wheezing or shortness of breath. 75 mL 5  . amLODipine (NORVASC) 10 MG tablet Take 10 mg by mouth daily.    . benzonatate (TESSALON) 200 MG capsule Take 1 capsule (200 mg total) by mouth 3 (three) times daily. (Patient not taking: Reported on 03/09/2015) 20 capsule 0  . budesonide-formoterol (SYMBICORT) 160-4.5 MCG/ACT inhaler Inhale 1 puff into the lungs See admin instructions. Inhale 1 puff every  morning, may inhale a 2nd puff in the evening if needed for wheezing    . flecainide (TAMBOCOR) 100 MG tablet Take 1 tablet (100 mg total) by mouth every 12 (twelve) hours. 180 tablet 3  . furosemide (LASIX) 20 MG tablet Take 1 tablet (20 mg total) by mouth daily. 30 tablet 0  . IRON PO Take 5 mLs by mouth daily. Reported on 02/16/2015    . losartan (COZAAR) 100 MG tablet Take 100 mg by mouth daily.    . Multiple Vitamin (MULTIVITAMIN WITH MINERALS) TABS  tablet Take 1 tablet by mouth daily.    . Nebivolol HCl (BYSTOLIC) 20 MG TABS Take 20 mg by mouth daily.     Marland Kitchen omeprazole (PRILOSEC) 20 MG capsule Take 20 mg by mouth daily.    . potassium chloride (K-DUR,KLOR-CON) 10 MEQ tablet Take 1 tablet (10 mEq total) by mouth daily. 30 tablet 0  . rivaroxaban (XARELTO) 20 MG TABS tablet Take 1 tablet (20 mg total) by mouth daily. (Patient taking differently: Take 20 mg by mouth daily with breakfast. ) 90 tablet 3   No current facility-administered medications for this visit.    Functional Status:   In your present state of health, do you have any difficulty performing the following activities: 02/27/2015 02/13/2015  Hearing? N N  Vision? Y N  Difficulty concentrating or making decisions? N N  Walking or climbing stairs? Y Y  Dressing or bathing? Y N  Doing errands, shopping? Y N  Preparing Food and eating ? N -  Using the Toilet? N -  In the past six months, have you accidently leaked urine? Y -  Do you have problems with loss of bowel control? N -  Managing your Medications? N -  Managing your Finances? N -  Housekeeping or managing your Housekeeping? N -    Fall/Depression Screening:    PHQ 2/9 Scores 02/27/2015 12/25/2014  PHQ - 2 Score 0 0    Assessment:   (1) reports improved breathing. (2) Left ankle pain. No swelling (3) Has all meds and is taking them correctly.  Plan:  (1) encouraged patient to continue to use oxygen as prescribed. Reviewed importance of calling MD at onset of symptoms. (2)Patient to see MD tomorrow for evaluation. (3) Encouraged patient to call [facility] if she starts having problems affording medications.   Discussed case closure with patient and she is in agreement that she no longer needs a nurse to come to her home. Reports that she knows how to manage well and knows to call her MD for any problems.  Again encouraged patient no to run out of medications. She voiced understanding. Will proceed with case closure.  Will notify Md and send patient a case closure letter.   THN CM Care Plan Problem One        Most Recent Value   Care Plan Problem One  Recent admission related to COPD/HF   Role Documenting the Problem One  Care Management [place] for Problem One  Active   THN Long Term Goal (31-90 days)  Patient will be able to verbalize no readmissions in the next 31 days.   THN Long Term Goal Start Date  02/16/15   [facility] Long Term Goal Met Date  03/20/15 Barrie Folk met]   Interventions for Problem One Long Term Goal  Encouraged frequent rest periods and avoid people that are sick.  Encouraged patient to call MD for any change in symptoms.   THN  CM Short Term Goal #1 (0-30 days)  Patient will verbalize seeing Primary MD as scheduled on 02/22/2015.   THN CM Short Term Goal #1 Start Date  02/16/15   [facility] CM Short Term Goal #1 Met Date  02/27/15 Barrie Folk met]   Interventions for Short Term Goal #1  Encouraged patient to ask any questions, Encouraged patient to discuss humdified oxygen with MD.   [facility] CM Short Term Goal #2 (0-30 days)  Patient will verbalize being active with [employer] in the nexr 7 days.   THN CM Short Term Goal #2 Start Date  02/16/15   [facility] CM Short Term Goal #2 Met Date  02/23/15 [goal met. [facility] contacted patient. she refused]   Interventions for Short Term Goal #2  Confirmed order in Discharge plan. Placed call to [facility] and left message.    THN CM Care Plan Problem Two        Most Recent Value   Care Plan Problem Two  Knowledge deficit related to daily COPD managment.   Role Documenting the Problem Two  Care Management Coordinator   Care Plan for Problem Two  Active   Interventions for Problem Two Long Term Goal   Reminded patient of fall precautions   THN Long Term Goal (31-90) days  Patient will report no falls from tripping over oxygen cord in the next 31 days.   THN Long Term Goal Start Date  02/27/15   [facility] Long Term Goal Met Date  03/26/15 [facility] met.  Patient denies falls.]   THN CM Short Term Goal #1 (0-30 days)  Patient will weigh and record weights atleast 2 times per week for the next 30 days.   THN CM Short Term Goal #1 Start Date  02/27/15   [facility] CM Short Term Goal #1 Met Date   03/26/15 [goal not met.]   Interventions for Short Term Goal #2   Patient able to verbalize weights . Enocuraged patient to continue to weigh       Tomasa Rand, RN, BSN, CEN [place] Coordinator [phone]

## 2015-03-27 DIAGNOSIS — C50919 Malignant neoplasm of unspecified site of unspecified female breast: Secondary | ICD-10-CM | POA: Diagnosis not present

## 2015-03-27 DIAGNOSIS — J449 Chronic obstructive pulmonary disease, unspecified: Secondary | ICD-10-CM | POA: Diagnosis not present

## 2015-03-27 DIAGNOSIS — I10 Essential (primary) hypertension: Secondary | ICD-10-CM | POA: Diagnosis not present

## 2015-03-29 DIAGNOSIS — J432 Centrilobular emphysema: Secondary | ICD-10-CM | POA: Diagnosis not present

## 2015-03-29 DIAGNOSIS — J45901 Unspecified asthma with (acute) exacerbation: Secondary | ICD-10-CM | POA: Diagnosis not present

## 2015-03-29 DIAGNOSIS — J449 Chronic obstructive pulmonary disease, unspecified: Secondary | ICD-10-CM | POA: Diagnosis not present

## 2015-03-29 DIAGNOSIS — I5020 Unspecified systolic (congestive) heart failure: Secondary | ICD-10-CM | POA: Diagnosis not present

## 2015-04-10 ENCOUNTER — Other Ambulatory Visit: Payer: Self-pay | Admitting: *Deleted

## 2015-04-10 ENCOUNTER — Telehealth: Payer: Self-pay | Admitting: Nurse Practitioner

## 2015-04-10 ENCOUNTER — Ambulatory Visit (HOSPITAL_BASED_OUTPATIENT_CLINIC_OR_DEPARTMENT_OTHER): Payer: PPO | Admitting: Nurse Practitioner

## 2015-04-10 ENCOUNTER — Encounter: Payer: Self-pay | Admitting: Nurse Practitioner

## 2015-04-10 VITALS — BP 151/53 | HR 67 | Temp 98.1°F | Resp 19 | Ht 66.0 in | Wt 247.3 lb

## 2015-04-10 DIAGNOSIS — J449 Chronic obstructive pulmonary disease, unspecified: Secondary | ICD-10-CM | POA: Diagnosis not present

## 2015-04-10 DIAGNOSIS — C50412 Malignant neoplasm of upper-outer quadrant of left female breast: Secondary | ICD-10-CM

## 2015-04-10 DIAGNOSIS — R0602 Shortness of breath: Secondary | ICD-10-CM | POA: Diagnosis not present

## 2015-04-10 DIAGNOSIS — C50912 Malignant neoplasm of unspecified site of left female breast: Secondary | ICD-10-CM | POA: Diagnosis not present

## 2015-04-10 DIAGNOSIS — R609 Edema, unspecified: Secondary | ICD-10-CM | POA: Diagnosis not present

## 2015-04-10 MED ORDER — POTASSIUM CHLORIDE CRYS ER 10 MEQ PO TBCR: 10.0000 meq | EXTENDED_RELEASE_TABLET | Freq: Every day | ORAL | Status: DC

## 2015-04-10 NOTE — Progress Notes (Signed)
[place]  [phone] [phone]) [phone]     ID: [name] OB: 1933-09-12  MR#: [id number]  VQQ#:[id number]  PCP: Jordan Peaches, MD GYN:   SU: Stark Klein OTHER MD: Thea Silversmith, Jordan Byrd, Jordan Byrd, Jordan Byrd  CHIEF COMPLAINT: Estrogen receptor positive invasive breast cancer CURRENT TREATMENT observation   BREAST CANCER HISTORY: From the original intake note:   Jordan Byrd underwent routine screening mammography at the breast Center 03/16/2013. The breast densities category C. In the left breast there were calcifications which call for further evaluation and on 04/13/2013 the patient underwent left unilateral mammography this confirmed a small group of pleomorphic calcifications not associated with architectural distortion. They had shown some change as compared to 2011.  Biopsy of the area in question 05/19/2013 showed ([id number]) and invasive ductal carcinoma, grade 1, estrogen receptor 100% positive, progesterone receptor 100% positive on both with strong staining intensity, with an MIB-1 of 6% and no HER-2 amplification, the signals ratio being 1.10 and a copy number per cell 2.30.  On 05/24/2013 the patient underwent bilateral breast MRI. There were only post biopsy changes noted in the left breast. There was no abnormality noted in the right breast and there are no abnormal appearing lymph nodes.  The patient's subsequent history is as detailed below   INTERVAL HISTORY: Jordan Byrd returns today for followup of her breast cancer, accompanied by her daughter. The interval history is remarkable for 2 hospitalizations, one this January, and one last April, for exacerbations of her COPD. She is chronically short of breath and prone to bronchitis. She is on 2 different inhalers several times a day. Dr. Annamaria Boots is her pulmonologist.  REVIEW OF SYSTEMS: Besides her chronic shortness of breath, Jordan Byrd has multiple joints that give her problems.  Right now it is her left shoulder where she has a history of a rotator cuff tear and her left ankle that she is prone to rolling. She uses tylenol PRN for this pain and is in a rolling walker/wheelchair for today's exam. She has bilateral lower extremity swelling and takes lasix daily, but has not been taking the prescribed potassium along with this because she has run out. She denies chest pain, cough, or palpitations. She has no headaches, dizziness, or vision changes. Her energy level is low. A detailed review of systems is otherwise stable.  PAST MEDICAL HISTORY: Past Medical History  Diagnosis Date  . Asthma   . Arthritis   . Hypertension   . Atrial fibrillation ([place])     a. 04/04/11  . Osteoarthritis     a. 03/28/11 - R Total Hip Arthroplasty  . Cataracts, both eyes   . Pneumonia   . Bronchitis   . Pleurisy   . Ankle swelling   . Kidney infection   . Frequent urination   . Excessive urination at night   . Weight gain   . CHF (congestive heart failure) ([place])   . Shortness of breath     doe-  . Anemia   . Anxiety     pt denies this  . GERD (gastroesophageal reflux disease)     [name] Prilosec  . Chronic headaches     no longer having headaches  . Macular degeneration   . Breast cancer ([place]) 05/19/13    left breast stage IIA     PAST SURGICAL HISTORY: Past Surgical History  Procedure Laterality Date  . Cardiac catheterization      1980's or 90's - reportedly normal  .  Abdominal hysterectomy  1962  . Appendectomy  1954  . Cholecystectomy    . Back surgery  1990  . Cataract extraction bilateral w/ anterior vitrectomy  2007/2008  . Total hip arthroplasty  03/28/2011    Procedure: TOTAL HIP ARTHROPLASTY;  Surgeon: Yvette Rack., MD;  Location: [place];  Service: Orthopedics;  Laterality: Right;  . Cardioversion  04/05/2011    Procedure: CARDIOVERSION;  Surgeon: Coralyn Mark, MD;  Location: [place];  Service: Cardiovascular;  Laterality: N/A;  . Tonsillectomy  1943  .  Adenoidectomy  1949  . Foot surgery  1947  . Thoracic disc surgery  2008  . Lumbar disc surgery  2010  . Knee surgery       both knees  . Shoulder surgery      left  . Total hip arthroplasty  03/28/2011    right  . Cardioversion N/A 06/19/2012    Procedure: CARDIOVERSION-bedside(3W36);  Surgeon: Lelon Perla, MD;  Location: [place];  Service: Cardiovascular;  Laterality: N/A;  . Colonoscopy    . Breast lumpectomy with needle localization Left 07/14/2013    Procedure: BREAST LUMPECTOMY WITH NEEDLE LOCALIZATION;  Surgeon: Stark Klein, MD;  Location: [place] [facility];  Service: General;  Laterality: Left;    FAMILY HISTORY Family History  Problem Relation Age of Onset  . Diabetes Father     Father, Mother, 4 sisters (2 living)  . Heart attack Father     (Deceased)  . Heart failure Father     (deceased 80)  . Hypertension Father   . Stroke Mother     (deceased 67)  . Hypertension Mother   . Cancer Sister 72    breast cancer  . Heart attack Sister   . Diabetes Sister   . Cancer Other 40    niece with breast cancer  The patient's father died at the age of 79 with congestive heart failure. The patient's mother died at the age of 68 following a stroke. The patient had one brother or sisters. One of her sisters was diagnosed with breast cancer at the age of 53, and died 2 years later. A niece from a different sister was diagnosed with breast cancer in her early 95s. There is no history of ovarian cancer in the family.  GYNECOLOGIC HISTORY:  Menarche age 78. The patient underwent hysterectomy and bilateral salpingo-oophorectomy in her mid 61s. She took hormone replacement for more than 30 years, stopping around age 39. She is GX P0.   SOCIAL HISTORY:  [name] used to work in a [employer]. She is a widow, and lives alone, with no pets. She has 3 stepchildren, Jordan Byrd, who lives in [place] and is a retired Therapist, music, Jordan Byrd who lives in [place] and is retired from [employer] and Jordan Byrd lives in  [place] and is a retired Engineer, structural. The patient's grandson Jordan Byrd was present at the time of her intake visit. He is disabled secondary to spinal problems. He used to work in Architect    ADVANCED DIRECTIVES: The patient has a living will in place but not healthcare power of attorney. She was given the appropriate documents to complete a healthcare power of attorney and she intends to name her grandson [name] as Special educational needs teacher of attorney. He can be reached at [place]: Social History  Substance Use Topics  . Smoking status: Former Smoker -- 1.00 packs/day for 21 years    Types: Cigarettes    Quit date: 08/15/1970  . Smokeless  tobacco: Never Used  . Alcohol Use: No     Colonoscopy:2015   GLO:VFIEPP post hysterectomy  Bone density:2008, normal  Lipid panel:  Allergies  Allergen Reactions  . Methocarbamol Hives  . Vicodin [Hydrocodone-Acetaminophen] Other (See Comments)    Interacts with bp medication and drops blood pressure  . Aspirin Nausea And Vomiting  . Butazolidin [Phenylbutazone] Other (See Comments)    Unknown reaction  . Celebrex [Celecoxib] Nausea And Vomiting  . Codeine Nausea And Vomiting  . Doripenem Rash  . Aspirin Buf(Alhyd-Mghyd-Cacar) Nausea And Vomiting  . Mucinex [Guaifenesin Er] Itching  . Temazepam Other (See Comments)    Unknown reaction    Current Outpatient Prescriptions  Medication Sig Dispense Refill  . acetaminophen (TYLENOL) 500 MG tablet Take 1,000 mg by mouth 2 (two) times daily.    Marland Kitchen albuterol (PROVENTIL HFA;VENTOLIN HFA) 108 (90 BASE) MCG/ACT inhaler Inhale 1 puff into the lungs every 6 (six) hours as needed for wheezing. Reported on 04/10/2015    . albuterol (PROVENTIL) (2.5 MG/3ML) 0.083% nebulizer solution Take 3 mLs (2.5 mg total) by nebulization every 6 (six) hours as needed for wheezing or shortness of breath. (Patient not taking: Reported on 04/10/2015) 75 mL 5  . amLODipine (NORVASC) 10 MG tablet Take  10 mg by mouth daily.    . budesonide-formoterol (SYMBICORT) 160-4.5 MCG/ACT inhaler Inhale 1 puff into the lungs See admin instructions. Inhale 1 puff every morning, may inhale a 2nd puff in the evening if needed for wheezing    . flecainide (TAMBOCOR) 100 MG tablet Take 1 tablet (100 mg total) by mouth every 12 (twelve) hours. 180 tablet 3  . furosemide (LASIX) 20 MG tablet Take 1 tablet (20 mg total) by mouth daily. 30 tablet 0  . IRON PO Take 5 mLs by mouth daily. Reported on 02/16/2015    . losartan (COZAAR) 100 MG tablet Take 100 mg by mouth daily.    . Multiple Vitamin (MULTIVITAMIN WITH MINERALS) TABS tablet Take 1 tablet by mouth daily.    . Nebivolol HCl (BYSTOLIC) 20 MG TABS Take 20 mg by mouth daily.     Marland Kitchen omeprazole (PRILOSEC) 20 MG capsule Take 20 mg by mouth daily.    . potassium chloride (K-DUR,KLOR-CON) 10 MEQ tablet Take 1 tablet (10 mEq total) by mouth daily. 30 tablet 0  . rivaroxaban (XARELTO) 20 MG TABS tablet Take 1 tablet (20 mg total) by mouth daily. (Patient taking differently: Take 20 mg by mouth daily with breakfast. ) 90 tablet 3   No current facility-administered medications for this visit.    OBJECTIVE: Elderly white woman examined in the chair  Filed Vitals:   04/10/15 1120  BP: 151/53  Pulse: 67  Temp: 98.1 F (36.7 C)  Resp: 19     Body mass index is 39.93 kg/(m^2).    ECOG FS:2 - Symptomatic, <50% confined to bed   Skin: warm, dry  HEENT: sclerae anicteric, conjunctivae pink, oropharynx clear. No thrush or mucositis.  Lymph Nodes: No cervical or supraclavicular lymphadenopathy  Lungs: clear to auscultation bilaterally, no rales, wheezes, or rhonci  Heart: regular rate and rhythm  Abdomen: round, soft, non tender, positive bowel sounds  Musculoskeletal: No focal spinal tenderness, no peripheral edema  Neuro: non focal, well oriented, positive affect  Breasts: left breast status post lumpectomy. Nipple retraction again noted. Palpated masses or skin  changes. Left axilla benign. Right breast unremarkable.    LAB RESULTS:  CMP     Component Value Date/Time  NA 138 02/15/2015 0307   NA 145 06/01/2013 0818   K 4.8 02/15/2015 0307   K 3.7 06/01/2013 0818   CL 100* 02/15/2015 0307   CO2 30 02/15/2015 0307   CO2 27 06/01/2013 0818   GLUCOSE 85 02/15/2015 0307   GLUCOSE 94 06/01/2013 0818   BUN 53* 02/15/2015 0307   BUN 36.1* 06/01/2013 0818   CREATININE 1.29* 02/15/2015 0307   CREATININE 1.4* 06/01/2013 0818   CALCIUM 8.1* 02/15/2015 0307   CALCIUM 10.1 06/01/2013 0818   PROT 6.0 05/23/2014 1115   PROT 6.9 06/01/2013 0818   ALBUMIN 3.8 05/23/2014 1115   ALBUMIN 4.0 06/01/2013 0818   AST 23 05/23/2014 1115   AST 15 06/01/2013 0818   ALT 25 05/23/2014 1115   ALT 12 06/01/2013 0818   ALKPHOS 66 05/23/2014 1115   ALKPHOS 87 06/01/2013 0818   BILITOT 0.8 05/23/2014 1115   BILITOT 0.30 06/01/2013 0818   GFRNONAA 38* 02/15/2015 0307   GFRAA 44* 02/15/2015 0307    I No results found for: SPEP  Lab Results  Component Value Date   WBC 7.5 02/15/2015   NEUTROABS 9.2* 05/23/2014   HGB 11.5* 02/15/2015   HCT 35.7* 02/15/2015   MCV 95.2 02/15/2015   PLT 222 02/15/2015      Chemistry      Component Value Date/Time   NA 138 02/15/2015 0307   NA 145 06/01/2013 0818   K 4.8 02/15/2015 0307   K 3.7 06/01/2013 0818   CL 100* 02/15/2015 0307   CO2 30 02/15/2015 0307   CO2 27 06/01/2013 0818   BUN 53* 02/15/2015 0307   BUN 36.1* 06/01/2013 0818   CREATININE 1.29* 02/15/2015 0307   CREATININE 1.4* 06/01/2013 0818      Component Value Date/Time   CALCIUM 8.1* 02/15/2015 0307   CALCIUM 10.1 06/01/2013 0818   ALKPHOS 66 05/23/2014 1115   ALKPHOS 87 06/01/2013 0818   AST 23 05/23/2014 1115   AST 15 06/01/2013 0818   ALT 25 05/23/2014 1115   ALT 12 06/01/2013 0818   BILITOT 0.8 05/23/2014 1115   BILITOT 0.30 06/01/2013 0818       No results found for: LABCA2  No components found for: LABCA125  No results for  input(s): INR in the last 168 hours.  Urinalysis    Component Value Date/Time   COLORURINE YELLOW 09/19/2012 2302   APPEARANCEUR CLEAR 09/19/2012 2302   LABSPEC 1.024 09/19/2012 2302   PHURINE 5.5 09/19/2012 2302   GLUCOSEU NEGATIVE 09/19/2012 2302   HGBUR NEGATIVE 09/19/2012 2302   BILIRUBINUR NEGATIVE 09/19/2012 2302   KETONESUR NEGATIVE 09/19/2012 2302   PROTEINUR NEGATIVE 09/19/2012 2302   UROBILINOGEN 0.2 09/19/2012 2302   NITRITE POSITIVE* 09/19/2012 2302   LEUKOCYTESUR SMALL* 09/19/2012 2302    STUDIES: Mm Diag Breast Tomo Bilateral  03/23/2015  CLINICAL DATA:  82 year old female with history of left breast cancer post lumpectomy in 2015. EXAM: DIGITAL DIAGNOSTIC BILATERAL MAMMOGRAM WITH 3D TOMOSYNTHESIS AND CAD COMPARISON:  Previous exam(s). ACR Breast Density Category c: The breast tissue is heterogeneously dense, which may obscure small masses. FINDINGS: No suspicious masses or calcifications are seen in either breast. Postsurgical changes are present in the upper slightly outer posterior left breast related to prior lumpectomy. A spot compression magnification MLO view of the lumpectomy site in the left breast was performed. There is no mammographic evidence of locally recurrent malignancy. Mammographic images were processed with CAD. IMPRESSION: No mammographic evidence of malignancy in either breast. RECOMMENDATION:  Diagnostic mammogram is suggested in 1 year. (Code:DM-B-01Y) I have discussed the findings and recommendations with the patient. Results were also provided in writing at the conclusion of the visit. If applicable, a reminder letter will be sent to the patient regarding the next appointment. BI-RADS CATEGORY  2: Benign. Electronically Signed   By: Everlean Alstrom M.D.   On: 03/23/2015 08:33     ASSESSMENT: 82 y.o. BRCA negative [place] woman status post left breast biopsy 05/19/2013 for a clinical T2 N0, stage IIA invasive ductal carcinoma, grade 1, estrogen and  progesterone receptor both strongly positive, with an MIB-1 of 6%, and no HER-2 amplification  (1) status post left lumpectomy 07/14/2013 for ductal carcinoma in situ, grade 1, with negative margins  (2) the patient was offered adjuvant radiation June 2015, declined.  (3) aromatase inhibitors discussed with the patient September 2015; declined  PLAN: Jordan Byrd is doing well as far as her breast cancer, almost 2 years out from her definitive surgery. She had a mammogram 3 weeks ago that was negative for disease recurrence. Her breast exam today was benign and she has no complaints from this aspect.   She does however have other chronic illnesses that are well managed by other physicians. I have given her a courtesy refill of potassium, but she will follow up with her PCP Dr. [name] for further evaluation and refills.   Siara will return in 1 year for labs and a follow up visit. She understands and agrees with this plan. She has been encouraged to call with any issues that might arise before her next visit here.   Laurie Panda, NP   04/10/2015 12:08 PM

## 2015-04-10 NOTE — Telephone Encounter (Signed)
appt made and avs printed °

## 2015-04-17 DIAGNOSIS — J45901 Unspecified asthma with (acute) exacerbation: Secondary | ICD-10-CM | POA: Diagnosis not present

## 2015-04-17 DIAGNOSIS — J449 Chronic obstructive pulmonary disease, unspecified: Secondary | ICD-10-CM | POA: Diagnosis not present

## 2015-04-17 DIAGNOSIS — Z01 Encounter for other special examination without complaint, suspected or reported diagnosis: Secondary | ICD-10-CM | POA: Diagnosis not present

## 2015-04-17 DIAGNOSIS — J432 Centrilobular emphysema: Secondary | ICD-10-CM | POA: Diagnosis not present

## 2015-04-25 ENCOUNTER — Encounter: Payer: Self-pay | Admitting: Internal Medicine

## 2015-04-25 ENCOUNTER — Ambulatory Visit (INDEPENDENT_AMBULATORY_CARE_PROVIDER_SITE_OTHER): Payer: PPO | Admitting: Internal Medicine

## 2015-04-25 VITALS — BP 128/58 | HR 65 | Ht 66.0 in | Wt 246.0 lb

## 2015-04-25 DIAGNOSIS — Z79899 Other long term (current) drug therapy: Secondary | ICD-10-CM

## 2015-04-25 DIAGNOSIS — I10 Essential (primary) hypertension: Secondary | ICD-10-CM | POA: Diagnosis not present

## 2015-04-25 DIAGNOSIS — I480 Paroxysmal atrial fibrillation: Secondary | ICD-10-CM

## 2015-04-25 DIAGNOSIS — I5032 Chronic diastolic (congestive) heart failure: Secondary | ICD-10-CM | POA: Diagnosis not present

## 2015-04-25 LAB — CBC WITH DIFFERENTIAL/PLATELET
BASOS ABS: 0 10*3/uL (ref 0.0–0.1)
BASOS PCT: 0 % (ref 0–1)
EOS ABS: 0.3 10*3/uL (ref 0.0–0.7)
EOS PCT: 3 % (ref 0–5)
HCT: 39.5 % (ref 36.0–46.0)
Hemoglobin: 12.7 g/dL (ref 12.0–15.0)
LYMPHS ABS: 1.1 10*3/uL (ref 0.7–4.0)
Lymphocytes Relative: 13 % (ref 12–46)
MCH: 30.1 pg (ref 26.0–34.0)
MCHC: 32.2 g/dL (ref 30.0–36.0)
MCV: 93.6 fL (ref 78.0–100.0)
MPV: 11.1 fL (ref 8.6–12.4)
Monocytes Absolute: 0.5 10*3/uL (ref 0.1–1.0)
Monocytes Relative: 6 % (ref 3–12)
NEUTROS PCT: 78 % — AB (ref 43–77)
Neutro Abs: 6.6 10*3/uL (ref 1.7–7.7)
PLATELETS: 356 10*3/uL (ref 150–400)
RBC: 4.22 MIL/uL (ref 3.87–5.11)
RDW: 14.3 % (ref 11.5–15.5)
WBC: 8.5 10*3/uL (ref 4.0–10.5)

## 2015-04-25 LAB — BASIC METABOLIC PANEL
BUN: 29 mg/dL — ABNORMAL HIGH (ref 7–25)
CALCIUM: 9.3 mg/dL (ref 8.6–10.4)
CHLORIDE: 104 mmol/L (ref 98–110)
CO2: 28 mmol/L (ref 20–31)
CREATININE: 1.4 mg/dL — AB (ref 0.60–0.93)
Glucose, Bld: 95 mg/dL (ref 65–99)
Potassium: 4.6 mmol/L (ref 3.5–5.3)
SODIUM: 142 mmol/L (ref 135–146)

## 2015-04-25 MED ORDER — FLECAINIDE ACETATE 50 MG PO TABS: 50.0000 mg | ORAL_TABLET | Freq: Two times a day (BID) | ORAL | Status: DC

## 2015-04-25 NOTE — Progress Notes (Signed)
PCP:  [name], MD  The patient presents today for routine electrophysiology followup.  She is doing very well presently.  Her afib appears to be controlled (no afib since last visit with me 9/15).  She has stable exertional SOB.  She has made no progress with weight reduction.  She states "I told Dr [name] that I am not trying any more diets".  Was recently hospitalized with COPD exacerbation but is now improved. Today, she denies symptoms of palpitations, orthopnea, PND, lower extremity edema (above baseline), dizziness, presyncope, syncope, or neurologic sequela.   The patient feels that she is tolerating medications without difficulties and is otherwise without complaint today.   Past Medical History  Diagnosis Date  . Asthma   . Arthritis   . Hypertension   . Atrial fibrillation ([place])     a. 04/04/11  . Osteoarthritis     a. 03/28/11 - R Total Hip Arthroplasty  . Cataracts, both eyes   . Pneumonia   . Bronchitis   . Pleurisy   . Ankle swelling   . Kidney infection   . Frequent urination   . Excessive urination at night   . Weight gain   . CHF (congestive heart failure) ([place])   . Shortness of breath     doe-  . Anemia   . Anxiety     pt denies this  . GERD (gastroesophageal reflux disease)     [name] Prilosec  . Chronic headaches     no longer having headaches  . Macular degeneration   . Breast cancer ([place]) 05/19/13    left breast stage IIA    Past Surgical History  Procedure Laterality Date  . Cardiac catheterization      1980's or 90's - reportedly normal  . Abdominal hysterectomy  1962  . Appendectomy  1954  . Cholecystectomy    . Back surgery  1990  . Cataract extraction bilateral w/ anterior vitrectomy  2007/2008  . Total hip arthroplasty  03/28/2011    Procedure: TOTAL HIP ARTHROPLASTY;  Surgeon: Yvette Rack., MD;  Location: [facility];  Service: Orthopedics;  Laterality: Right;  . Cardioversion  04/05/2011    Procedure: CARDIOVERSION;  Surgeon: Coralyn Mark,  MD;  Location: [facility];  Service: Cardiovascular;  Laterality: N/A;  . Tonsillectomy  1943  . Adenoidectomy  1949  . Foot surgery  1947  . Thoracic disc surgery  2008  . Lumbar disc surgery  2010  . Knee surgery       both knees  . Shoulder surgery      left  . Total hip arthroplasty  03/28/2011    right  . Cardioversion N/A 06/19/2012    Procedure: CARDIOVERSION-bedside(3W36);  Surgeon: Lelon Perla, MD;  Location: [facility];  Service: Cardiovascular;  Laterality: N/A;  . Colonoscopy    . Breast lumpectomy with needle localization Left 07/14/2013    Procedure: BREAST LUMPECTOMY WITH NEEDLE LOCALIZATION;  Surgeon: Stark Klein, MD;  Location: [facility];  Service: General;  Laterality: Left;    Current Outpatient Prescriptions  Medication Sig Dispense Refill  . acetaminophen (TYLENOL) 500 MG tablet Take 1,000 mg by mouth 2 (two) times daily.    Marland Kitchen albuterol (PROVENTIL HFA;VENTOLIN HFA) 108 (90 BASE) MCG/ACT inhaler Inhale 1 puff into the lungs every 6 (six) hours as needed for wheezing. Reported on 04/10/2015    . amLODipine (NORVASC) 10 MG tablet Take 10 mg by mouth daily.    . budesonide-formoterol (SYMBICORT) 160-4.5  MCG/ACT inhaler Inhale 2 puffs into the lungs See admin instructions. Inhale 1 puff every morning, may inhale a 2nd puff in the evening if needed for wheezing    . flecainide (TAMBOCOR) 100 MG tablet Take 1 tablet (100 mg total) by mouth every 12 (twelve) hours. 180 tablet 3  . furosemide (LASIX) 20 MG tablet Take 1 tablet (20 mg total) by mouth daily. 30 tablet 0  . IRON PO Take 5 mLs by mouth daily. Reported on 02/16/2015    . losartan (COZAAR) 100 MG tablet Take 100 mg by mouth daily.    . Multiple Vitamin (MULTIVITAMIN WITH MINERALS) TABS tablet Take 1 tablet by mouth daily.    . Nebivolol HCl (BYSTOLIC) 20 MG TABS Take 20 mg by mouth daily.     Marland Kitchen omeprazole (PRILOSEC) 20 MG capsule Take 20 mg by mouth daily.    . potassium chloride (K-DUR) 10 MEQ tablet Take 10 tablets by  mouth daily.    . potassium chloride (K-DUR,KLOR-CON) 10 MEQ tablet Take 1 tablet (10 mEq total) by mouth daily. 30 tablet 0  . rivaroxaban (XARELTO) 20 MG TABS tablet Take 20 mg by mouth daily with supper.     No current facility-administered medications for this visit.   ROS- all systems are reviewed and negative except as per HPI above  Allergies  Allergen Reactions  . Methocarbamol Hives  . Vicodin [Hydrocodone-Acetaminophen] Other (See Comments)    Interacts with bp medication and drops blood pressure  . Aspirin Nausea And Vomiting  . Butazolidin [Phenylbutazone] Other (See Comments)    Unknown reaction  . Celebrex [Celecoxib] Nausea And Vomiting  . Codeine Nausea And Vomiting  . Doripenem Rash  . Aspirin Buf(Alhyd-Mghyd-Cacar) Nausea And Vomiting  . Mucinex [Guaifenesin Er] Itching  . Temazepam Other (See Comments)    Unknown reaction    Social History   Social History  . Marital Status: Widowed    Spouse Name: N/A  . Number of Children: N/A  . Years of Education: N/A   Occupational History  . Retired    Social History Main Topics  . Smoking status: Former Smoker -- 1.00 packs/day for 21 years    Types: Cigarettes    Quit date: 08/15/1970  . Smokeless tobacco: Never Used  . Alcohol Use: No  . Drug Use: No  . Sexual Activity: Not Currently   Other Topics Concern  . Not on file   Social History Narrative   Lives alone in [place].  Widowed in 2012 (husband w/ dementia & parkinsons)..   Not active, does not drive due to vision problems.    Family History  Problem Relation Age of Onset  . Diabetes Father     Father, Mother, 4 sisters (2 living)  . Heart attack Father     (Deceased)  . Heart failure Father     (deceased 70)  . Hypertension Father   . Stroke Mother     (deceased 60)  . Hypertension Mother   . Cancer Sister 28    breast cancer  . Heart attack Sister   . Diabetes Sister   . Cancer Other 44    niece with breast cancer   Physical  Exam: Filed Vitals:   04/25/15 0953  BP: 128/58  Pulse: 65  Height: 5\' 6"  (1.676 m)  Weight: 246 lb (111.585 kg)    GEN- The patient is overweight and chronically ill appearing, alert and oriented x 3 today.   Head- normocephalic, atraumatic Eyes-  Sclera  clear, conjunctiva pink Ears- hearing intact Oropharynx- clear Neck- supple,   Lungs- Clear to ausculation bilaterally, normal work of breathing Heart- Regular rate and rhythm, no murmurs, rubs or gallops, PMI not laterally displaced GI- soft, NT, ND, + BS Extremities- no clubbing, cyanosis, +1edema MS- no significant deformity or atrophy Skin- no rash or lesion Psych- euthymic mood, full affect Neuro- strength and sensation are intact  ekg from 1/17 reviewed myoview from 2014 reviewed Prior echo reviewed  Assessment and Plan:  1. afib Controlled Will reduce flecainide to 50mg  BID today Continue long term anticoagulation Check bmet, cbc today  2. Chronic diastolic dysfunction.  euvolemic  No changes today  3. CRI Stable Continue to follow with nephrology  4. HTN Stable No change required today  5. Exertional SOB  Prior myoview was low risk and echo reveals preserved EF Weight reduction and regular exercise are encouraged Follow-up with Dr [name] for COPD management   Return to see Cecille Rubin in 4 months I will see in 12 months  Thompson Grayer MD, [facility] 04/25/2015 10:23 AM

## 2015-04-25 NOTE — Patient Instructions (Signed)
Medication Instructions:  Your physician has recommended you make the following change in your medication: 1) DECREASE Flecainide to 50 mg twice a day   (the new prescription was sent in as 50 mg tablets)  Labwork: Medication management labs today: BMET, CBCD  Testing/Procedures: None ordered  Follow-Up: Your physician recommends that you schedule a follow-up appointment in: 4 months with Truitt Merle, NP.   Your physician wants you to follow-up in: 1 year with Dr. Rayann Heman.  You will receive a reminder letter in the mail two months in advance. If you don't receive a letter, please call our office to schedule the follow-up appointment.  If you need a refill on your cardiac medications before your next appointment, please call your pharmacy.  Thank you for choosing [facility]!!

## 2015-04-26 DIAGNOSIS — I5020 Unspecified systolic (congestive) heart failure: Secondary | ICD-10-CM | POA: Diagnosis not present

## 2015-04-26 DIAGNOSIS — J449 Chronic obstructive pulmonary disease, unspecified: Secondary | ICD-10-CM | POA: Diagnosis not present

## 2015-04-26 DIAGNOSIS — J432 Centrilobular emphysema: Secondary | ICD-10-CM | POA: Diagnosis not present

## 2015-04-26 DIAGNOSIS — J45901 Unspecified asthma with (acute) exacerbation: Secondary | ICD-10-CM | POA: Diagnosis not present

## 2015-04-30 ENCOUNTER — Encounter: Payer: Self-pay | Admitting: Internal Medicine

## 2015-04-30 ENCOUNTER — Ambulatory Visit (INDEPENDENT_AMBULATORY_CARE_PROVIDER_SITE_OTHER): Payer: PPO | Admitting: Internal Medicine

## 2015-04-30 VITALS — BP 126/80 | HR 59 | Ht 66.0 in | Wt 243.2 lb

## 2015-04-30 DIAGNOSIS — J9611 Chronic respiratory failure with hypoxia: Secondary | ICD-10-CM | POA: Diagnosis not present

## 2015-04-30 DIAGNOSIS — J449 Chronic obstructive pulmonary disease, unspecified: Secondary | ICD-10-CM

## 2015-04-30 NOTE — Patient Instructions (Addendum)
Ok to  continue current meds  Please call if we can help   

## 2015-04-30 NOTE — Progress Notes (Signed)
03/07/14- 84 yoF former smoker referred courtesy of Dr Rayann Heman for sleep medicine evaluation. Son here Complicated by hx breast CaL She says she sleeps "fine" except for nocturia every 2 hours. Widowed, not aware of snoring. Admits some daytime sleepiness. Smoked for 21 years, quitting at age 82. History of intermittent asthma without rhinitis. Had pneumonia last year. Sometimes wheezes but denies coughing. Using Symbicort 2 puffs, once daily and rare need for rescue inhaler.  Dyspnea on exertion has increased in the past year-walking one or 2 rooms, making bed. Weight increased.. Feet swell if she sits a lot. Cardiology follows for atrial fibrillation/Xarelto She had worked in a [place] with incidental dust. PFT 10/26/13- Moderately severe obstructive disease, no response, possible restriction, mild Diffusion reduction.FVC 2.18/ 75%, FEV1 1.30/ 59%, FEV1/FVC 0.60,  DLCO 71%, TLC 99% CXR 08/15/13- IMPRESSION: No acute cardiopulmonary disease. No explanation for left-sided chest pain. Electronically Signed  By: Abigail Miyamoto M.D.  On: 08/15/2013  05/10/14- 80 yoF former smoker followed for COPD/ FEV1 59%. Son here SOB w/activity;chest tightness; wheezing; Complicated by hx L breast Ca, dCHF, PAF Overnight oximetry on 03/10/2014 qualified for oxygen at 2 L during sleep but she is reluctant. Says she used oxygen in the past and "couldn't keep it on". Continues Symbicort. Did not like Spiriva. Unwilling to consider a sleep study.  06/14/2014 post hospital follow-up Returns for a post hospital follow-up Patient was re- admitted April 19 through April 23 for acute on chronic diastolic heart failure exacerbation with acute respiratory failure and COPD exacerbation. She was treated with IV antibiotics, steroids, and nebulized bronchodilators. She did require aggressive diuresis. Discharged on a prednisone taper and antibiotics Started on oxygen with act and At bedtime   Remains on  Symbicort. Since discharge. Patient is feeling weak and run down . Has lingering cough and congestion . No fever, chest pain, orthopnea or increased edema.  On lasix 40mg  Twice daily  .  Prior to this admission. Patient had been in the hospital April 9 for COPD flare with influenza. Treated with antibiotics, steroids, and Tamiflu. She denies any hemoptysis, orthopnea, PND, or increased leg swelling.  08/02/14- 73 yoF former smoker followed for COPD. Son here  Complicated by hx L breast Ca, dCHF, PAF FOLLOWS FOR:  Reports SOB. Denies chest tightness, wheezing or coughing. Has been to the hospital twice since her last OV- flu and chf/COPD. O2 2L activity and sleep/ Advanced Rarely needs rescue inhaler but continues Symbicort daily. Oxygen helps. CXR 1V 05/23/14 FINDINGS: The heart size and mediastinal contours are within normal limits. Both lungs are clear. The visualized skeletal structures are unremarkable. IMPRESSION: No active disease. Electronically Signed  By: Franchot Gallo M.D.  On: 05/23/2014 11:46  12/04/14- 58 yoF former smoker followed for COPD, chronic respiratory failure with hypoxia. Son here FOLLOWS FOR: uses O2 at night and daytime as needed(trips over cord during the night).  Pt states she has occasional SOB and wheezing with activity. Feels "wonderful". Continues oxygen 2 L/Advanced Meds okay. Had flu vaccine  04/30/2015-82 year old female former smoker followed for COPD, chronic respiratory failure with hypoxia, complicated by history left breast cancer, dCHF, PAF O2 2L sleep, exertion/ Advanced FOLLOWS FOR: Pt states her breathing has been doing well; no SOB, wheezing, cough, or congestion since being out of the hospital in January 2017. Hosp 1/6-1/12- Acute on Chronic Respiratory Failure with COPD exacerbation, chronic dCHF, CKD III Son brings her. She reports "feel fine" today and satisfied with medications. She uses a walker and  goes outside only rarely now. CXR  02/09/2015 IMPRESSION: Lungs are clear and there is no evidence of acute cardiopulmonary abnormality. No evidence of pneumonia. No evidence of congestive heart failure. Electronically Signed  By: Franki Cabot M.D.  On: 02/09/2015 14:22   ROS-see HPI Constitutional:   No-   weight loss, night sweats, fevers, chills, fatigue, lassitude. HEENT:   No-  headaches, difficulty swallowing, tooth/dental problems, sore throat,       No-  sneezing, itching, ear ache, nasal congestion, post nasal drip,  CV:  No-   chest pain, orthopnea, PND, swelling in lower extremities, anasarca,                                                          dizziness,  Resp: + shortness of breath with exertion or at rest.              No-   productive cough,  No non-productive cough,  No- coughing up of blood.             No-   change in color of mucus.   Skin: No-   rash or lesions. GI:  No-   heartburn, indigestion, abdominal pain, nausea, vomiting, GU:  MS:  .  No- decreased range of motion.  No- back pain. Neuro-     nothing unusual Psych:  No- change in mood or affect. No depression or anxiety.  No memory loss.  OBJ- Physical Exam     + obese, + left her oxygen in the truck, 94% at rest on room air General- Alert, Oriented, Affect-appropriate, Distress- none acute Skin- rash-none, lesions- none, excoriation- none Lymphadenopathy- none Head- atraumatic            Eyes- Gross vision intact, PERRLA, conjunctivae and secretions clear            Ears- Hearing, canals-normal            Nose- Clear, no-Septal dev, mucus, polyps, erosion, perforation             Throat- Mallampati II , mucosa clear , drainage- none, tonsils- atrophic, + dentures Neck- flexible , trachea midline, no stridor , thyroid nl, carotid no bruit Chest - symmetrical excursion , unlabored           Heart/CV- RRR , no murmur , no gallop  , no rub, nl s1 s2                           - JVD- none , edema+1, stasis changes- none, varices-  none           Lung- clear to P&A, wheeze- none, cough- none , dullness-none, rub- none           Chest wall-  Abd-  Br/ Gen/ Rectal- Not done, not indicated Extrem- cyanosis- none, clubbing, none, atrophy- none, strength- nl, + walker Neuro- grossly intact to observation

## 2015-05-01 NOTE — Assessment & Plan Note (Signed)
She is stable since discharge from last acute exacerbation. We discussed medications with no changes to offer.

## 2015-05-01 NOTE — Assessment & Plan Note (Signed)
She remains dependent on home oxygen for sleep and exertion but is comfortable at rest. We discussed options.

## 2015-05-03 DIAGNOSIS — M19012 Primary osteoarthritis, left shoulder: Secondary | ICD-10-CM | POA: Diagnosis not present

## 2015-05-03 DIAGNOSIS — S93492A Sprain of other ligament of left ankle, initial encounter: Secondary | ICD-10-CM | POA: Diagnosis not present

## 2015-05-10 DIAGNOSIS — I4891 Unspecified atrial fibrillation: Secondary | ICD-10-CM | POA: Diagnosis not present

## 2015-05-10 DIAGNOSIS — I119 Hypertensive heart disease without heart failure: Secondary | ICD-10-CM | POA: Diagnosis not present

## 2015-05-10 DIAGNOSIS — Z23 Encounter for immunization: Secondary | ICD-10-CM | POA: Diagnosis not present

## 2015-05-18 DIAGNOSIS — J432 Centrilobular emphysema: Secondary | ICD-10-CM | POA: Diagnosis not present

## 2015-05-18 DIAGNOSIS — J449 Chronic obstructive pulmonary disease, unspecified: Secondary | ICD-10-CM | POA: Diagnosis not present

## 2015-05-18 DIAGNOSIS — J45901 Unspecified asthma with (acute) exacerbation: Secondary | ICD-10-CM | POA: Diagnosis not present

## 2015-05-27 DIAGNOSIS — J432 Centrilobular emphysema: Secondary | ICD-10-CM | POA: Diagnosis not present

## 2015-05-27 DIAGNOSIS — J449 Chronic obstructive pulmonary disease, unspecified: Secondary | ICD-10-CM | POA: Diagnosis not present

## 2015-05-27 DIAGNOSIS — J45901 Unspecified asthma with (acute) exacerbation: Secondary | ICD-10-CM | POA: Diagnosis not present

## 2015-05-27 DIAGNOSIS — I5020 Unspecified systolic (congestive) heart failure: Secondary | ICD-10-CM | POA: Diagnosis not present

## 2015-05-28 DIAGNOSIS — H524 Presbyopia: Secondary | ICD-10-CM | POA: Diagnosis not present

## 2015-05-28 DIAGNOSIS — H353133 Nonexudative age-related macular degeneration, bilateral, advanced atrophic without subfoveal involvement: Secondary | ICD-10-CM | POA: Diagnosis not present

## 2015-05-30 ENCOUNTER — Telehealth: Payer: Self-pay | Admitting: Internal Medicine

## 2015-05-30 NOTE — Telephone Encounter (Signed)
Pt's Flecinide dosage was changed on 3-22 -started having numbness in hands legs and feet, thinks it made be med

## 2015-05-31 NOTE — Telephone Encounter (Signed)
I have tried to call patient back and the home number voicemail comes on then cuts off and unable to leave a message.  The cell phone is not working.  Hopefully she will call me back.  I will keep trying.

## 2015-06-05 DIAGNOSIS — J449 Chronic obstructive pulmonary disease, unspecified: Secondary | ICD-10-CM | POA: Diagnosis not present

## 2015-06-05 DIAGNOSIS — I119 Hypertensive heart disease without heart failure: Secondary | ICD-10-CM | POA: Diagnosis not present

## 2015-06-05 DIAGNOSIS — D559 Anemia due to enzyme disorder, unspecified: Secondary | ICD-10-CM | POA: Diagnosis not present

## 2015-06-05 DIAGNOSIS — E7800 Pure hypercholesterolemia, unspecified: Secondary | ICD-10-CM | POA: Diagnosis not present

## 2015-06-05 DIAGNOSIS — Z0000 Encounter for general adult medical examination without abnormal findings: Secondary | ICD-10-CM | POA: Diagnosis not present

## 2015-06-19 DIAGNOSIS — H35362 Drusen (degenerative) of macula, left eye: Secondary | ICD-10-CM | POA: Diagnosis not present

## 2015-06-19 DIAGNOSIS — H43813 Vitreous degeneration, bilateral: Secondary | ICD-10-CM | POA: Diagnosis not present

## 2015-06-19 DIAGNOSIS — H353122 Nonexudative age-related macular degeneration, left eye, intermediate dry stage: Secondary | ICD-10-CM | POA: Diagnosis not present

## 2015-06-19 DIAGNOSIS — H359 Unspecified retinal disorder: Secondary | ICD-10-CM | POA: Diagnosis not present

## 2015-06-19 DIAGNOSIS — H353114 Nonexudative age-related macular degeneration, right eye, advanced atrophic with subfoveal involvement: Secondary | ICD-10-CM | POA: Diagnosis not present

## 2015-06-26 DIAGNOSIS — I5020 Unspecified systolic (congestive) heart failure: Secondary | ICD-10-CM | POA: Diagnosis not present

## 2015-06-26 DIAGNOSIS — J45901 Unspecified asthma with (acute) exacerbation: Secondary | ICD-10-CM | POA: Diagnosis not present

## 2015-06-26 DIAGNOSIS — J449 Chronic obstructive pulmonary disease, unspecified: Secondary | ICD-10-CM | POA: Diagnosis not present

## 2015-06-26 DIAGNOSIS — J432 Centrilobular emphysema: Secondary | ICD-10-CM | POA: Diagnosis not present

## 2015-07-27 DIAGNOSIS — J432 Centrilobular emphysema: Secondary | ICD-10-CM | POA: Diagnosis not present

## 2015-07-27 DIAGNOSIS — I5020 Unspecified systolic (congestive) heart failure: Secondary | ICD-10-CM | POA: Diagnosis not present

## 2015-07-27 DIAGNOSIS — J45901 Unspecified asthma with (acute) exacerbation: Secondary | ICD-10-CM | POA: Diagnosis not present

## 2015-07-27 DIAGNOSIS — J449 Chronic obstructive pulmonary disease, unspecified: Secondary | ICD-10-CM | POA: Diagnosis not present

## 2015-08-08 DIAGNOSIS — I4891 Unspecified atrial fibrillation: Secondary | ICD-10-CM | POA: Diagnosis not present

## 2015-08-08 DIAGNOSIS — I5021 Acute systolic (congestive) heart failure: Secondary | ICD-10-CM | POA: Diagnosis not present

## 2015-08-08 DIAGNOSIS — L918 Other hypertrophic disorders of the skin: Secondary | ICD-10-CM | POA: Diagnosis not present

## 2015-08-08 DIAGNOSIS — J449 Chronic obstructive pulmonary disease, unspecified: Secondary | ICD-10-CM | POA: Diagnosis not present

## 2015-08-08 DIAGNOSIS — I10 Essential (primary) hypertension: Secondary | ICD-10-CM | POA: Diagnosis not present

## 2015-08-26 DIAGNOSIS — J432 Centrilobular emphysema: Secondary | ICD-10-CM | POA: Diagnosis not present

## 2015-08-26 DIAGNOSIS — I5020 Unspecified systolic (congestive) heart failure: Secondary | ICD-10-CM | POA: Diagnosis not present

## 2015-08-26 DIAGNOSIS — J45901 Unspecified asthma with (acute) exacerbation: Secondary | ICD-10-CM | POA: Diagnosis not present

## 2015-08-26 DIAGNOSIS — J449 Chronic obstructive pulmonary disease, unspecified: Secondary | ICD-10-CM | POA: Diagnosis not present

## 2015-08-28 ENCOUNTER — Ambulatory Visit (INDEPENDENT_AMBULATORY_CARE_PROVIDER_SITE_OTHER): Payer: PPO | Admitting: Nurse Practitioner

## 2015-08-28 ENCOUNTER — Encounter: Payer: Self-pay | Admitting: Nurse Practitioner

## 2015-08-28 VITALS — BP 132/62 | HR 64 | Ht 66.0 in | Wt 232.8 lb

## 2015-08-28 DIAGNOSIS — I10 Essential (primary) hypertension: Secondary | ICD-10-CM | POA: Diagnosis not present

## 2015-08-28 DIAGNOSIS — R0602 Shortness of breath: Secondary | ICD-10-CM

## 2015-08-28 DIAGNOSIS — Z79899 Other long term (current) drug therapy: Secondary | ICD-10-CM

## 2015-08-28 DIAGNOSIS — I480 Paroxysmal atrial fibrillation: Secondary | ICD-10-CM

## 2015-08-28 DIAGNOSIS — I5032 Chronic diastolic (congestive) heart failure: Secondary | ICD-10-CM | POA: Diagnosis not present

## 2015-08-28 NOTE — Patient Instructions (Addendum)
We will be checking the following labs today - NONE   Medication Instructions:    Continue with your current medicines.  We will check for samples for Xarelto and Bystolic     Testing/Procedures To Be Arranged:  N/A  Follow-Up:   See Dr. Rayann Heman as planned.    Other Special Instructions:   Keep up the good work    If you need a refill on your cardiac medications before your next appointment, please call your pharmacy.   Call the [place] office at [phone] if you have any questions, problems or concerns.

## 2015-08-28 NOTE — Progress Notes (Signed)
CARDIOLOGY OFFICE NOTE  Date:  08/28/2015    [name] Date of Birth: 08-07-1933 Medical Record [id number]  PCP:  [name], MD  Cardiologist:  [facility]    Chief Complaint  Patient presents with  . Atrial Fibrillation  . Congestive Heart Failure    Follow up visit - seen for Dr. Rayann Heman    History of Present Illness: NAKHIYA Byrd is a 82 y.o. female who presents today for a 4 month check. Seen for Dr. Rayann Heman. She has PAF, HTN, CKD and diastolic heart failure. She has had breast cancer. Remains obese. No know CAD. Has had prior cardioversions for her atrial fib and in the past had been "highly symptomatic" but at other times has not been aware. Remains on flecainide - dose was reduced back in march of 2017 by Dr. Rayann Heman. Now on Xarelto for anticoagulation.   Last seen back in March - no AF noted. Flecainide cut back. Not interested in weight loss. Looks like she called in about a month later with some hand numbness but we were not able to get back in contact with her.   Comes back today. Here with her grandson today. Using her walker. Slow moving. She has lost weight - not eating out as much and just eating less in general. No chest pain. Breathing is ok. No palpitations. No bleeding or bruising. Needs samples of Xarelto and Bystolic - has not qualified for assistance per her report.   Past Medical History:  Diagnosis Date  . Anemia   . Ankle swelling   . Anxiety    pt denies this  . Arthritis   . Asthma   . Atrial fibrillation ([place])    a. 04/04/11  . Breast cancer ([place]) 05/19/13   left breast stage IIA   . Bronchitis   . Cataracts, both eyes   . CHF (congestive heart failure) ([place])   . Chronic headaches    no longer having headaches  . Excessive urination at night   . Frequent urination   . GERD (gastroesophageal reflux disease)    [name] Prilosec  . Hypertension   . Kidney infection   . Macular degeneration   . Osteoarthritis    a. 03/28/11 - R Total  Hip Arthroplasty  . Pleurisy   . Pneumonia   . Shortness of breath    doe-  . Weight gain     Past Surgical History:  Procedure Laterality Date  . ABDOMINAL HYSTERECTOMY  1962  . ADENOIDECTOMY  1949  . APPENDECTOMY  1954  . BACK SURGERY  1990  . BREAST LUMPECTOMY WITH NEEDLE LOCALIZATION Left 07/14/2013   Procedure: BREAST LUMPECTOMY WITH NEEDLE LOCALIZATION;  Surgeon: Stark Klein, MD;  Location: [facility];  Service: General;  Laterality: Left;  . CARDIAC CATHETERIZATION     1980's or 90's - reportedly normal  . CARDIOVERSION  04/05/2011   Procedure: CARDIOVERSION;  Surgeon: Coralyn Mark, MD;  Location: [facility];  Service: Cardiovascular;  Laterality: N/A;  . CARDIOVERSION N/A 06/19/2012   Procedure: CARDIOVERSION-bedside([id number]);  Surgeon: Lelon Perla, MD;  Location: [facility] OR;  Service: Cardiovascular;  Laterality: N/A;  . CATARACT EXTRACTION BILATERAL W/ ANTERIOR VITRECTOMY  2007/2008  . CHOLECYSTECTOMY    . COLONOSCOPY    . FOOT SURGERY  1947  . KNEE SURGERY      both knees  . [place] SURGERY  2010  . SHOULDER SURGERY     left  . THORACIC DISC SURGERY  2008  . TONSILLECTOMY  1943  . TOTAL HIP ARTHROPLASTY  03/28/2011   Procedure: TOTAL HIP ARTHROPLASTY;  Surgeon: Yvette Rack., MD;  Location: [facility];  Service: Orthopedics;  Laterality: Right;  . TOTAL HIP ARTHROPLASTY  03/28/2011   right     Medications: Current Outpatient Prescriptions  Medication Sig Dispense Refill  . acetaminophen (TYLENOL) 500 MG tablet Take 1,000 mg by mouth 2 (two) times daily.    Marland Kitchen albuterol (PROVENTIL HFA;VENTOLIN HFA) 108 (90 BASE) MCG/ACT inhaler Inhale 1 puff into the lungs every 6 (six) hours as needed for wheezing. Reported on 04/10/2015    . amLODipine (NORVASC) 10 MG tablet Take 10 mg by mouth daily.    . budesonide-formoterol (SYMBICORT) 160-4.5 MCG/ACT inhaler Inhale 2 puffs into the lungs See admin instructions. Inhale 1 puff every morning, may inhale a 2nd puff in the evening if needed  for wheezing    . flecainide (TAMBOCOR) 50 MG tablet Take 1 tablet (50 mg total) by mouth every 12 (twelve) hours. 60 tablet 11  . furosemide (LASIX) 20 MG tablet Take 1 tablet (20 mg total) by mouth daily. 30 tablet 0  . IRON PO Take 5 mLs by mouth daily. Reported on 04/30/2015    . losartan (COZAAR) 100 MG tablet Take 100 mg by mouth daily.    . Multiple Vitamin (MULTIVITAMIN WITH MINERALS) TABS tablet Take 1 tablet by mouth daily.    . Multiple Vitamins-Minerals (ICAPS) CAPS Take 1 capsule by mouth daily.    . Nebivolol HCl (BYSTOLIC) 20 MG TABS Take 20 mg by mouth daily.     Marland Kitchen omeprazole (PRILOSEC) 20 MG capsule Take 20 mg by mouth daily.    . potassium chloride (K-DUR) 10 MEQ tablet Take 10 tablets by mouth daily.    . rivaroxaban (XARELTO) 20 MG TABS tablet Take 20 mg by mouth daily with supper.     No current facility-administered medications for this visit.     Allergies: Allergies  Allergen Reactions  . Methocarbamol Hives  . Vicodin [Hydrocodone-Acetaminophen] Other (See Comments)    Interacts with bp medication and drops blood pressure  . Aspirin Nausea And Vomiting  . Butazolidin [Phenylbutazone] Other (See Comments)    Unknown reaction  . Celebrex [Celecoxib] Nausea And Vomiting  . Codeine Nausea And Vomiting  . Doripenem Rash  . Aspirin Buf(Alhyd-Mghyd-Cacar) Nausea And Vomiting  . Mucinex [Guaifenesin Er] Itching  . Temazepam Other (See Comments)    Unknown reaction    Social History: The patient  reports that she quit smoking about 45 years ago. Her smoking use included Cigarettes. She has a 21.00 pack-year smoking history. She has never used smokeless tobacco. She reports that she does not drink alcohol or use drugs.   Family History: The patient's family history includes Cancer (age of onset: 55) in her sister; Cancer (age of onset: 46) in her other; Diabetes in her father and sister; Heart attack in her father and sister; Heart failure in her father;  Hypertension in her father and mother; Stroke in her mother.   Review of Systems: Please see the history of present illness.   Otherwise, the review of systems is positive for none.   All other systems are reviewed and negative.   Physical Exam: VS:  BP 132/62 (BP Location: Right Arm, Patient Position: Sitting, Cuff Size: Large)   Pulse 64   Ht 5\' 6"  (1.676 m)   Wt 232 lb 12.8 oz (105.6 kg)   SpO2 95% Comment:  at rest  BMI 37.57 kg/m  .  BMI Body mass index is 37.57 kg/m.  Wt Readings from Last 3 Encounters:  08/28/15 232 lb 12.8 oz (105.6 kg)  04/30/15 243 lb 3.2 oz (110.3 kg)  04/25/15 246 lb (111.6 kg)    General: Pleasant. Elderly female. She is alert and in no acute distress. Her weight is down 14 pounds.  She is using a walker.  HEENT: Normal.  Neck: Supple, no JVD, carotid bruits, or masses noted.  Cardiac: Regular rate and rhythm. No murmurs, rubs, or gallops. No edema.  Respiratory:  Lungs are fairly clear with some soft wheezing but with normal work of breathing.  GI: Soft and nontender.  MS: No deformity or atrophy. Gait and ROM intact.  Skin: Warm and dry. Color is normal.  Neuro:  Strength and sensation are intact and no gross focal deficits noted.  Psych: Alert, appropriate and with normal affect.   LABORATORY DATA:  EKG:  EKG is not ordered today.   Lab Results  Component Value Date   WBC 8.5 04/25/2015   HGB 12.7 04/25/2015   HCT 39.5 04/25/2015   PLT 356 04/25/2015   GLUCOSE 95 04/25/2015   CHOL 204 (H) 04/05/2011   TRIG 211 (H) 04/05/2011   HDL 26 (L) 04/05/2011   LDLCALC 136 (H) 04/05/2011   ALT 25 05/23/2014   AST 23 05/23/2014   NA 142 04/25/2015   K 4.6 04/25/2015   CL 104 04/25/2015   CREATININE 1.40 (H) 04/25/2015   BUN 29 (H) 04/25/2015   CO2 28 04/25/2015   TSH 1.307 09/19/2012   INR 1.07 04/04/2011   HGBA1C 5.7 (H) 02/09/2015    BNP (last 3 results)  Recent Labs  02/09/15 1343  BNP 41.2    ProBNP (last 3 results) No  results for input(s): PROBNP in the last 8760 hours.   Other Studies Reviewed Today:  MYOVIEW FROM October 2014  Overall Impression: This is a low risk scan. There is a moderate size area of decreased activity in the midanterior wall to the apex. This is fixed. Most likely this is artifact related to breast attenuation. There is no definite ischemia.  LV Ejection Fraction: 59%. LV Wall Motion: Normal Wall Motion  Dola Argyle, MD   ECHO Study Conclusions from June 2014  - Left ventricle: The cavity size was mildly dilated. Wall thickness was normal. Systolic function was normal. The estimated ejection fraction was in the range of 55% to 60%. - Mitral valve: Mild regurgitation. - Left atrium: The atrium was mildly dilated.    Assessment / Plan:  1. COPD - followed by pulmonary.   2. Paroxysmal atrial fib - on Xarelto - remains on Flecainide as well - dose cut back at last visit. Remains in sinus.   3. Chronic diastolic HF - seems compensated at this time.  4. Chest pain - no known CAD - low risk Myoview from 2014.   5. Progressive CKD - she has a creatinine clearance of 53.24 - will continue with current dose of her Xarelto.   6. Obesity - she has lost 14 pounds - says this is from just eating less.   Current medicines are reviewed with the patient today.  The patient does not have concerns regarding medicines other than what has been noted above.  The following changes have been made:  See above.  Labs/ tests ordered today include:   No orders of the defined types were placed in this encounter.  Disposition:   FU with Dr. Rayann Heman as planned.   Patient is agreeable to this plan and will call if any problems develop in the interim.   Signed: Burtis Junes, RN, ANP-C 08/28/2015 8:48 AM  [facility] [address] [place], [place]  [address] Phone: [phone] Fax: [phone]

## 2015-09-26 DIAGNOSIS — I5020 Unspecified systolic (congestive) heart failure: Secondary | ICD-10-CM | POA: Diagnosis not present

## 2015-09-26 DIAGNOSIS — J432 Centrilobular emphysema: Secondary | ICD-10-CM | POA: Diagnosis not present

## 2015-09-26 DIAGNOSIS — J449 Chronic obstructive pulmonary disease, unspecified: Secondary | ICD-10-CM | POA: Diagnosis not present

## 2015-09-26 DIAGNOSIS — J45901 Unspecified asthma with (acute) exacerbation: Secondary | ICD-10-CM | POA: Diagnosis not present

## 2015-10-01 ENCOUNTER — Other Ambulatory Visit: Payer: Self-pay

## 2015-10-01 NOTE — Patient Outreach (Signed)
Care Coordination:  Patient called and request help with medication cost. States she can not afford xarelto, symbicort, and bystolic.   Reports she is otherwise managing well. PLAN:  Will place order for self referral for pharmacy assistance. Informed patient that someone who be in touch to help with medication cost and explore options.  Tomasa Rand, RN, BSN, CEN [facility] [employer] [phone]

## 2015-10-17 ENCOUNTER — Other Ambulatory Visit: Payer: Self-pay | Admitting: Pharmacist

## 2015-10-17 NOTE — Patient Outreach (Signed)
[facility]) Care Management  [place]   10/17/2015  Jordan Byrd [birth year] [id number]  Subjective:  Patient was referred to [place] by [place] Coordinator, Estill Bamberg C for medication assistance evaluation.    Called patient on 10/17/15, patient answered, verified her HIPAA details, and purpose of call was explained to her.   Patient reports she is interested in patient assistance for Symbicort, Xarelto, and Bystolic.  Patient had spoken with [place] in 03/2015 per review of notes and had discussed patient assistance and SSA Extra Help at that time.   She reports she is still not eligible for SSA Extra Help.  She reports she is not sure of her year-to-date out-of-pocket prescription drug spend yet.  She reports she has 7 days of medication left.    Objective:   Current Medications: Current Outpatient Prescriptions  Medication Sig Dispense Refill  . acetaminophen (TYLENOL) 500 MG tablet Take 1,000 mg by mouth 2 (two) times daily.    Marland Kitchen albuterol (PROVENTIL HFA;VENTOLIN HFA) 108 (90 BASE) MCG/ACT inhaler Inhale 1 puff into the lungs every 6 (six) hours as needed for wheezing. Reported on 04/10/2015    . amLODipine (NORVASC) 10 MG tablet Take 10 mg by mouth daily.    . budesonide-formoterol (SYMBICORT) 160-4.5 MCG/ACT inhaler Inhale 2 puffs into the lungs See admin instructions. Inhale 1 puff every morning, may inhale a 2nd puff in the evening if needed for wheezing    . flecainide (TAMBOCOR) 50 MG tablet Take 1 tablet (50 mg total) by mouth every 12 (twelve) hours. 60 tablet 11  . furosemide (LASIX) 20 MG tablet Take 1 tablet (20 mg total) by mouth daily. 30 tablet 0  . IRON PO Take 5 mLs by mouth daily. Reported on 04/30/2015    . losartan (COZAAR) 100 MG tablet Take 100 mg by mouth daily.    . Multiple Vitamin (MULTIVITAMIN WITH MINERALS) TABS tablet Take 1 tablet by mouth daily.    . Multiple Vitamins-Minerals (ICAPS) CAPS Take 1 capsule  by mouth daily.    . Nebivolol HCl (BYSTOLIC) 20 MG TABS Take 20 mg by mouth daily.     Marland Kitchen omeprazole (PRILOSEC) 20 MG capsule Take 20 mg by mouth daily.    . potassium chloride (K-DUR) 10 MEQ tablet Take 10 tablets by mouth daily.    . rivaroxaban (XARELTO) 20 MG TABS tablet Take 20 mg by mouth daily with supper.     No current facility-administered medications for this visit.     Functional Status: In your present state of health, do you have any difficulty performing the following activities: 02/27/2015 02/13/2015  Hearing? N N  Vision? Y N  Difficulty concentrating or making decisions? N N  Walking or climbing stairs? Y Y  Dressing or bathing? Y N  Doing errands, shopping? Y N  Preparing Food and eating ? N -  Using the Toilet? N -  In the past six months, have you accidently leaked urine? Y -  Do you have problems with loss of bowel control? N -  Managing your Medications? N -  Managing your Finances? N -  Housekeeping or managing your Housekeeping? N -  Some recent data might be hidden    Fall/Depression Screening: PHQ 2/9 Scores 02/27/2015 12/25/2014  PHQ - 2 Score 0 0    Assessment:  Medication assistance:  Reviewed patient assistance for [employer] and [employer] ([place]) and [place] patient assistance program (Symbicort)---both programs require  Part D Beneficiaries spend at least 3% out-of-pocket in current plan year to be considered for eligibility.   [employer] patient assistance program Scientist, water quality) does not list specific requirements, but notes that patient must not be eligible for SSA Extra help.   Plan:  Will contact patient's MA-PDP to try to ascertain her year to date out-of-pocket spend as patient provided verbal permission for [facility] Pharmacist to do this.   Once out-of-pocket spend is reviewed, will make another outreach attempt to patient to see if she is eligible/wanting to apply to manufacturer patient assistance.   Karrie Meres,  PharmD, [place] [phone]

## 2015-10-18 ENCOUNTER — Other Ambulatory Visit: Payer: Self-pay | Admitting: Pharmacist

## 2015-10-18 ENCOUNTER — Encounter: Payer: Self-pay | Admitting: Pharmacist

## 2015-10-18 NOTE — Patient Outreach (Signed)
[facility]) Care Management  [place]   10/18/2015  Jordan Byrd 12-12-1933 [id number]  Subjective:  Follow-up phone call to patient regarding patient assistance.  Patient answered and verified her HIPAA details.    Patient reports that she picked up a 30 day supply of Symbicort on 10/09/15 and that she presently has enough Xarelto and Bystolic through next week Wednesday.    Discussed with patient that it appears based on her reported income, she may have met the 3% out-of-pocket spend requirement for manufacturer patient assistance for Xarelto Jordan Byrd) and Symbicort ([place] Prescription Savings Program).    Patient is interested in applying to the manufacturer patient assistance programs to see if she is able to get help the remainder of the year.    Objective:   Current Medications: Current Outpatient Prescriptions  Medication Sig Dispense Refill  . acetaminophen (TYLENOL) 500 MG tablet Take 1,000 mg by mouth 2 (two) times daily.    Marland Kitchen albuterol (PROVENTIL HFA;VENTOLIN HFA) 108 (90 BASE) MCG/ACT inhaler Inhale 1 puff into the lungs every 6 (six) hours as needed for wheezing. Reported on 04/10/2015    . amLODipine (NORVASC) 10 MG tablet Take 10 mg by mouth daily.    . budesonide-formoterol (SYMBICORT) 160-4.5 MCG/ACT inhaler Inhale 2 puffs into the lungs See admin instructions. Inhale 1 puff every morning, may inhale a 2nd puff in the evening if needed for wheezing    . flecainide (TAMBOCOR) 50 MG tablet Take 1 tablet (50 mg total) by mouth every 12 (twelve) hours. 60 tablet 11  . furosemide (LASIX) 20 MG tablet Take 1 tablet (20 mg total) by mouth daily. 30 tablet 0  . IRON PO Take 5 mLs by mouth daily. Reported on 04/30/2015    . losartan (COZAAR) 100 MG tablet Take 100 mg by mouth daily.    . Multiple Vitamin (MULTIVITAMIN WITH MINERALS) TABS tablet Take 1 tablet by mouth daily.    . Multiple Vitamins-Minerals (ICAPS)  CAPS Take 1 capsule by mouth daily.    . Nebivolol HCl (BYSTOLIC) 20 MG TABS Take 20 mg by mouth daily.     Marland Kitchen omeprazole (PRILOSEC) 20 MG capsule Take 20 mg by mouth daily.    . potassium chloride (K-DUR) 10 MEQ tablet Take 10 tablets by mouth daily.    . rivaroxaban (XARELTO) 20 MG TABS tablet Take 20 mg by mouth daily with supper.     No current facility-administered medications for this visit.     Functional Status: In your present state of health, do you have any difficulty performing the following activities: 02/27/2015 02/13/2015  Hearing? N N  Vision? Y N  Difficulty concentrating or making decisions? N N  Walking or climbing stairs? Y Y  Dressing or bathing? Y N  Doing errands, shopping? Y N  Preparing Food and eating ? N -  Using the Toilet? N -  In the past six months, have you accidently leaked urine? Y -  Do you have problems with loss of bowel control? N -  Managing your Medications? N -  Managing your Finances? N -  Housekeeping or managing your Housekeeping? N -  Some recent data might be hidden    Fall/Depression Screening: PHQ 2/9 Scores 02/27/2015 12/25/2014  PHQ - 2 Score 0 0    Assessment:  Medication assistance:  [place] (Xarelto), [place] Prescription Savings Program (Symbicort) and [employer] Patient Assistance Program (Bystolic) applications will be mailed to  patient per her request.  She verified her address and was counseled she will need to complete all three applications, provide a copy of her proof of income, and return the applications, so prescriber can complete their portion.    Counseled patient that there is of course no guarantee she will be approved.    Plan:  Will mail out patient the three manufacturer patient assistance program applications as discussed above.    Will make outreach attempt to patient next week to see if she received patient assistance applications or if she has any questions about  completing patient assistance applications.    Will route note and barriers letter to patient's PCP.  Jordan Byrd, PharmD, [place] [phone]

## 2015-10-23 ENCOUNTER — Telehealth: Payer: Self-pay | Admitting: Internal Medicine

## 2015-10-23 ENCOUNTER — Other Ambulatory Visit: Payer: Self-pay | Admitting: Pharmacist

## 2015-10-23 NOTE — Patient Outreach (Signed)
[facility]) Care Management  10/23/2015  Jordan Byrd [birth year] [id number]  Successful phone outreach to patient.  She verified her HIPAA details.  Patient reports she has not received manufacturer patient assistance applications in the mail yet.  She reports she has her Bystolic and Xarelto doses through Thursday.    Discussed with patient that once her patient assistance applications are completed, they can be faxed to the respective manufacturer patient assistance programs.   Patient plans to contact her prescriber's office to see if prescriber has any samples of Bystolic or Xarelto.   Plan:  Patient agrees to call [facility] Pharmacist back to let [facility] know if she was able to get samples or not.   Will continue to follow-up with patient.   Karrie Meres, PharmD, [place] [phone]

## 2015-10-23 NOTE — Telephone Encounter (Signed)
Patient calling the office for samples of medication:   1.  What medication and dosage are you requesting samples for? bistolic [id number] & xarelto 20mg  2.  Are you currently out of this medication? 3days

## 2015-10-24 MED ORDER — NEBIVOLOL HCL 20 MG PO TABS
20.0000 mg | ORAL_TABLET | Freq: Every day | ORAL | 11 refills | Status: DC
Start: 2015-10-24 — End: 2018-02-23

## 2015-10-24 MED ORDER — RIVAROXABAN 20 MG PO TABS: 20.0000 mg | ORAL_TABLET | Freq: Every day | ORAL | 11 refills | Status: AC

## 2015-10-25 ENCOUNTER — Telehealth: Payer: Self-pay | Admitting: Internal Medicine

## 2015-10-25 ENCOUNTER — Other Ambulatory Visit: Payer: Self-pay | Admitting: Pharmacist

## 2015-10-25 NOTE — Telephone Encounter (Signed)
New Message  Patient calling the office for samples of medication:   1.  What medication and dosage are you requesting samples for? bistolic [id number] & xarelto 20mg   2.  Are you currently out of this medication? Yes

## 2015-10-25 NOTE — Telephone Encounter (Signed)
called and let pt know samples of Bystolic & Xarelto will be placed up front.

## 2015-10-25 NOTE — Patient Outreach (Signed)
[facility]) Care Management  10/25/2015  Jordan Byrd [birth year] [id number]  Incoming call from patient, she verified her HIPAA details.  Patient states that she found another bottle of Bystolic samples in her home and now has 1 more week left.  She states that her cardiologist office has samples of Xarelto for her.    Patient also reported that she received the patient assistance applications in the mail for [employer] Patient [place] ([place]), [employer] Patient Assistance Scientist, water quality) and AZ&ME Prescription Savings Program (Symbicort).  She reports applications are completed and she will have her grand-son drop off the applications at the [facility] office.    Provided patient with [place] address so she/her family member can drop off applications.  She prefers to drop off applications at the office versus mail them back.    Plan:   Will continue to work with patient to see if she is eligible for manufacturer patient assistance for Xarelto, Bystolic, or Symbicort.   Karrie Meres, PharmD, [place] [phone]

## 2015-10-25 NOTE — Telephone Encounter (Deleted)
New Message ° ° °Patient calling the office for samples of medication: ° ° °1.  What medication and dosage are you requesting samples for?  ° °2.  Are you currently out of this medication?  ° ° °

## 2015-10-27 DIAGNOSIS — I5020 Unspecified systolic (congestive) heart failure: Secondary | ICD-10-CM | POA: Diagnosis not present

## 2015-10-27 DIAGNOSIS — J45901 Unspecified asthma with (acute) exacerbation: Secondary | ICD-10-CM | POA: Diagnosis not present

## 2015-10-27 DIAGNOSIS — J432 Centrilobular emphysema: Secondary | ICD-10-CM | POA: Diagnosis not present

## 2015-10-27 DIAGNOSIS — J449 Chronic obstructive pulmonary disease, unspecified: Secondary | ICD-10-CM | POA: Diagnosis not present

## 2015-10-31 ENCOUNTER — Ambulatory Visit (INDEPENDENT_AMBULATORY_CARE_PROVIDER_SITE_OTHER): Payer: PPO | Admitting: Internal Medicine

## 2015-10-31 ENCOUNTER — Encounter: Payer: Self-pay | Admitting: Internal Medicine

## 2015-10-31 VITALS — BP 122/78 | HR 63 | Ht 66.0 in | Wt 232.0 lb

## 2015-10-31 DIAGNOSIS — J9611 Chronic respiratory failure with hypoxia: Secondary | ICD-10-CM

## 2015-10-31 DIAGNOSIS — I480 Paroxysmal atrial fibrillation: Secondary | ICD-10-CM

## 2015-10-31 DIAGNOSIS — J449 Chronic obstructive pulmonary disease, unspecified: Secondary | ICD-10-CM

## 2015-10-31 NOTE — Patient Instructions (Signed)
Ok to continue present meds  Continue the oxygen 2L sleep and exertion, as discussed  Don't forget your flu shot with Dr [name]  Please call if we can help

## 2015-10-31 NOTE — Assessment & Plan Note (Signed)
She is doing very well now after a quiet summer. Her biggest risk will be onset of respiratory infection during the fall/winter. We discussed oxygen and medications. Reminded to keep appointment for her flu shot.

## 2015-10-31 NOTE — Assessment & Plan Note (Signed)
She is not using daytime oxygen and has a lot of self-confidence. Otherwise she has an acute exacerbation she might eventually be able to stop home O2

## 2015-10-31 NOTE — Progress Notes (Signed)
03/07/14- 89 yoF former smoker referred courtesy of Dr Rayann Heman for sleep medicine evaluation. Son here Complicated by hx breast Ca   12/04/14- 81 yoF former smoker followed for COPD, chronic respiratory failure with hypoxia. Son here FOLLOWS FOR: uses O2 at night and daytime as needed(trips over cord during the night).  Pt states she has occasional SOB and wheezing with activity. Feels "wonderful". Continues oxygen 2 L/Advanced Meds okay. Had flu vaccine  04/30/2015-81 year old female former smoker followed for COPD, chronic respiratory failure with hypoxia, complicated by history left breast cancer, dCHF, PAF O2 2L sleep, exertion/ Advanced FOLLOWS FOR: Pt states her breathing has been doing well; no SOB, wheezing, cough, or congestion since being out of the hospital in January 2017. Hosp 1/6-1/12- Acute on Chronic Respiratory Failure with COPD exacerbation, chronic dCHF, CKD III Son brings her. She reports "feel fine" today and satisfied with medications. She uses a walker and goes outside only rarely now. CXR 02/09/2015 IMPRESSION: Lungs are clear and there is no evidence of acute cardiopulmonary abnormality. No evidence of pneumonia. No evidence of congestive heart failure. Electronically Signed  By: Franki Cabot M.D.  On: 02/09/2015 14:22  11/01/2015-82 year old female former smoker followed for COPD, chronic respiratory failure with hypoxia, complicated by history left breast cancer, dCHF, PAF/ xarelto O2 2 L sleep, exertion/Advanced FOLLOWS FOR: Pt states her breathing is wonderful; has not had to use O2 in past 3 months. Plans to get flu vaccine at primary office. Has felt very well through the summer with no recent respiratory infection. Not needing rescue inhaler but continues Symbicort twice daily.  ROS-see HPI Constitutional:   No-   weight loss, night sweats, fevers, chills, fatigue, lassitude. HEENT:   No-  headaches, difficulty swallowing, tooth/dental problems, sore  throat,       No-  sneezing, itching, ear ache, nasal congestion, post nasal drip,  CV:  No-   chest pain, orthopnea, PND, swelling in lower extremities, anasarca,                                                          dizziness,  Resp: + shortness of breath with exertion or at rest.              No-   productive cough,  No non-productive cough,  No- coughing up of blood.             No-   change in color of mucus.   Skin: No-   rash or lesions. GI:  No-   heartburn, indigestion, abdominal pain, nausea, vomiting, GU:  MS:  .  No- decreased range of motion.  No- back pain. Neuro-     nothing unusual Psych:  No- change in mood or affect. No depression or anxiety.  No memory loss.  OBJ- Physical Exam     + obese,  General- Alert, Oriented, Affect-appropriate, Distress- none acute, + room air resting O2 saturation 95% Skin- rash-none, lesions- none, excoriation- none Lymphadenopathy- none Head- atraumatic            Eyes- Gross vision intact, PERRLA, conjunctivae and secretions clear            Ears- Hearing, canals-normal            Nose- Clear, no-Septal dev, mucus, polyps, erosion, perforation  Throat- Mallampati II , mucosa clear , drainage- none, tonsils- atrophic, + dentures Neck- flexible , trachea midline, no stridor , thyroid nl, carotid no bruit Chest - symmetrical excursion , unlabored           Heart/CV- RRR , no murmur , no gallop  , no rub, nl s1 s2                           - JVD- none , edema+1, stasis changes- none, varices- none           Lung- clear to P&A, wheeze- none, cough- none , dullness-none, rub- none           Chest wall-  Abd-  Br/ Gen/ Rectal- Not done, not indicated Extrem- cyanosis- none, clubbing, none, atrophy- none, strength- nl, + walker Neuro- grossly intact to observation

## 2015-10-31 NOTE — Assessment & Plan Note (Signed)
Heart rhythm consistent with sinus rhythm at today's visit. She continues Xarelto, managed by cardiology.

## 2015-11-12 ENCOUNTER — Other Ambulatory Visit: Payer: Self-pay | Admitting: Pharmacist

## 2015-11-12 DIAGNOSIS — J449 Chronic obstructive pulmonary disease, unspecified: Secondary | ICD-10-CM | POA: Diagnosis not present

## 2015-11-12 DIAGNOSIS — I4891 Unspecified atrial fibrillation: Secondary | ICD-10-CM | POA: Diagnosis not present

## 2015-11-12 DIAGNOSIS — I5021 Acute systolic (congestive) heart failure: Secondary | ICD-10-CM | POA: Diagnosis not present

## 2015-11-12 DIAGNOSIS — Z23 Encounter for immunization: Secondary | ICD-10-CM | POA: Diagnosis not present

## 2015-11-12 NOTE — Patient Outreach (Signed)
[place] [facility]) Care Management  11/12/2015  Jordan Byrd 03-12-1933 [id number]  Successful phone follow-up with patient regarding her patient assistance applications.  Patient answered phone call and verified HIPAA details.    Patient reported she had not heard from any of the patient assistance programs yet.  Offered to make a three way conference call with patient to [employer] Patient [place] (Xarelto) and [employer] Prescription Savings Program (Symbicort) to follow-up application status and patient accepted.   [employer] Patient [place] representative reported application was received and was being sent for benefits review.  Representative reports this usually takes 3-5 business days and to call back on Friday for a status update.   [employer] Prescription Savings Program representative reports application was received but the prescription is missing the prescriber's state license number.  The representative reports the prescription should be re-sent with prescriber's state license number and it will then be evaluated.    Plan:  Will get [employer] Prescription Savings Application missing information to prescriber for completion and get it re-faxed to patient assistance program.    Will continue to follow-up with patient on her patient assistance applications.    Karrie Meres, PharmD, [place] [phone]

## 2015-11-16 ENCOUNTER — Other Ambulatory Visit: Payer: Self-pay | Admitting: Pharmacist

## 2015-11-16 NOTE — Patient Outreach (Signed)
[place] [facility]) Care Management  11/16/2015  Jordan Byrd 1934-01-31 [id number]  Follow-up phone call to patient regarding her patient assistance applications for [employer] Patient [place] (Xarelto), [employer] Prescription Savings Program (Symbicort) and [employer] Patient Assistance Program (Bystolic).   Patient verified her HIPAA details.  Patient consented to three way conference calls to the manufacturer patient assistance programs to follow-up her applications.   [employer] Patient Assistance Foundation:  Representative reported patient was approved for Xarelto temporarily for 30 days.  Representative reports a form will be getting mailed to patient from the patient assistance program she needs to completed and return.  Representative provided pharmacy card information for patient to use at her local pharmacy---patient will also be receiving a pharmacy card in the mail from the manufacturer patient assistance program.    [employer] Prescription Savings Program:  Representative reported the date from prescriber portion of the application is not legible and needs to be resubmitted.  Representative reports they have not contacted prescriber's office regarding this.  Representative stated during call there is nothing else missing from application besides the prescription date.   [employer] Patient Assistance Program:  Automated system and representative reported that patient was approved 06/13/15 through 02/03/16 and that prescriber office must call patient assistance program to request refill which will then ship to prescriber's office.   Plan:  Took [employer]Me prescription savings application to PCP office for date to be clarified and PCP office to fax back to [employer].    Advised PCP office they must contact [employer] Patient Assistance Program to request refill.   Patient's local pharmacy provided with pharmacy card billing information and patient aware her local  pharmacy will get her Xarelto ready.   Will continue to follow-up with patient regarding patient assistance.   Karrie Meres, PharmD, [place] [phone]

## 2015-11-20 ENCOUNTER — Other Ambulatory Visit: Payer: Self-pay | Admitting: Pharmacist

## 2015-11-20 NOTE — Patient Outreach (Signed)
[name] [facility]) Care Management  11/20/2015  Jordan Byrd 12/02/1933 [id number]  Successful follow-up phone call to patient regarding her patient assistance applications.  Patient verified HIPAA details.  She reports she was not able to get her Xarelto from her local pharmacy as her pharmacy apparently told her she had co-insurance.    She reports she has not heard from her PCP office or [employer] Prescription Savings Program.    Three way conference call to [employer] Prescription Savings Program with patient consent to follow-up on her application.  Representative reports that they received the updated prescription from prescriber office but it was invalid due to being received without a cover sheet.  Call was disconnected with [employer] Prescription Savings Program.    [facility] Pharmacist advised patient will follow-up with PCP office for AZ&Me and her local pharmacy for her Xarelto.    Contacted [facility], spoke with Patrick Jupiter, provided them with the [facility] [employer] Patient [place] card information---he reports prescription was processed and would be ready for patient.   Called Dr Rolly Salter office, advised them that [employer] Prescription Savings Program is requiring prescription be re-faxed with a cover sheet from office.  They reported they had not yet contacted [employer] for patient's refill of Bystolic as they thought patient was ineligible for [employer] patient assistance.  Advised PCP office that on 11/16/15 [employer] patient assistance program said prescriber just needed to request refill.   Called patient back, she again verified her HIPAA details.  Updated her on her prescription for Xarelto at her local pharmacy and that her PCP office agrees to re-fax paperwork to [employer] Prescription Savings Program.   Plan:  Will continue to follow-up with patient regarding her patient assistance applications.   Karrie Meres, PharmD, [place] [phone]

## 2015-11-21 ENCOUNTER — Other Ambulatory Visit: Payer: Self-pay | Admitting: Pharmacist

## 2015-11-21 NOTE — Patient Outreach (Signed)
[place] [facility]) Care Management  11/21/2015  SHARDEY GALO Sep 26, 1933 [id number]  Late entry for 11/21/15.   1015:  Successful phone outreach to patient on 11/21/15, patient verified her HIPAA details.  Patient reports she has not heard from [employer] regarding her application for Symbicort.  Placed a three way conference call to [employer] Prescription Savings Program and representative reported they still have not received the updated prescription with a cover sheet from MD office.   Call with [employer] was disconnected.  Counseled patient [facility] Pharmacist would follow-up with PCP and request they refax prescription with cover sheet.  Patient reported that she called [employer] Patient Assistance Program and was told her refill is in process for Bystolic.    Called PCP office, Dr Grayland Ormond, and was told office refaxed paperwork to [employer] Prescription Savings today.   1550:  Successful phone outreach to patient, who again verified her HIPAA details.  Advised patient PCP office stated they re-faxed paperwork to [employer] and offered to call [employer] to follow-up on her application.  Three way conference call to [employer] and representative reported that patient's application was approved and she is fully enrolled until 02/03/16.  Representative reported that medication should he received by patient in the next 7-10 business days.  Call was disconnected with [employer].    Plan:   Will follow-up with patient over the next 2 weeks to see if she received her patient assistance medications.   Karrie Meres, PharmD, [place] [phone]

## 2015-11-26 DIAGNOSIS — J45901 Unspecified asthma with (acute) exacerbation: Secondary | ICD-10-CM | POA: Diagnosis not present

## 2015-11-26 DIAGNOSIS — M75112 Incomplete rotator cuff tear or rupture of left shoulder, not specified as traumatic: Secondary | ICD-10-CM | POA: Diagnosis not present

## 2015-11-26 DIAGNOSIS — J449 Chronic obstructive pulmonary disease, unspecified: Secondary | ICD-10-CM | POA: Diagnosis not present

## 2015-11-26 DIAGNOSIS — J432 Centrilobular emphysema: Secondary | ICD-10-CM | POA: Diagnosis not present

## 2015-11-26 DIAGNOSIS — I5020 Unspecified systolic (congestive) heart failure: Secondary | ICD-10-CM | POA: Diagnosis not present

## 2015-11-27 ENCOUNTER — Other Ambulatory Visit: Payer: Self-pay | Admitting: Pharmacist

## 2015-11-27 NOTE — Patient Outreach (Signed)
[facility]) Care Management  11/27/2015  DARELY NOVEL 13-Nov-1933 [id number]  Late entry for 11/27/15.    Patient left [facility] Pharmacist a voicemail regarding a letter she received from [employer] Prescription Savings Program.    [facility] Pharmacist returned phone call to patient.  Patient verified her HIPAA details.    She states she received a letter dated 11/22/15 from [employer] Prescription Savings program that she needed to have her prescriber fax a prescription for Symbicort with a cover letter.   [facility] Pharmacist thought this was taken care of based on information [employer] Prescription Savings program provided [facility] Pharmacist and patient on 11/21/15.   Three way conference to [employer] Prescriptions Savings program per patient request---representative reported that patient is fully enrolled in patient assistance program until 02/03/16 and an order is being processed for patient.   Call was disconnected with [employer] Prescription Savings Program.   Patient also stated she received her pharmacy card in the mail for [employer] Patient [place] and her Bystolic was shipped to her prescriber's office.   Patient denied other questions/concerns at this time.   Plan:   Will follow-up with patient as previously scheduled.  Once patient obtains her patient assistance supplied medication, anticipate pharmacy case closure.   Karrie Meres, PharmD, [place] [phone]

## 2015-12-03 ENCOUNTER — Other Ambulatory Visit: Payer: Self-pay | Admitting: Pharmacist

## 2015-12-03 NOTE — Patient Outreach (Signed)
[place] [facility]) Care Management  12/03/2015  Jordan Byrd 1933/10/03 [id number]  Successful phone outreach to patient.  Patient states that she has received her Xarelto with [employer] Patient [place] manager from [employer] Patient Assistance.   She reports she received a letter from [employer] Prescription Savings that Symbicort will be mailed to her but states she has not received it yet.    Plan:  Will make patient outreach attempt within the next week to see if she received Symbicort patient assistance.   Karrie Meres, PharmD, [place] [phone]

## 2015-12-04 ENCOUNTER — Encounter: Payer: Self-pay | Admitting: Pharmacist

## 2015-12-04 ENCOUNTER — Other Ambulatory Visit: Payer: Self-pay | Admitting: Pharmacist

## 2015-12-04 NOTE — Patient Outreach (Signed)
[place] [facility]) Care Management  12/04/2015  Jordan Byrd [birth year] [id number]  Incoming call received from patient.  She verified HIPAA details.  Patient states she received her Symbicort patient assistance in the mail from [employer] Prescription Savings.    Patient has been approved for manufacturer patient assistance through 02/03/16 for Symbicort, Bystolic, Xarelto.    She is aware she will need to meet requirements and re-apply for patient assistance in 2018.   She denies further pharmacy related questions/concerns.   Plan:  Will close pharmacy case.    Will send PCP case closure letter.    Patient has [facility] Pharmacist contact information should new pharmacy needs arise.   Karrie Meres, PharmD, [place] [phone]

## 2015-12-07 ENCOUNTER — Ambulatory Visit: Payer: Self-pay | Admitting: Pharmacist

## 2015-12-25 DIAGNOSIS — G609 Hereditary and idiopathic neuropathy, unspecified: Secondary | ICD-10-CM | POA: Diagnosis not present

## 2015-12-25 DIAGNOSIS — I10 Essential (primary) hypertension: Secondary | ICD-10-CM | POA: Diagnosis not present

## 2015-12-25 DIAGNOSIS — M1990 Unspecified osteoarthritis, unspecified site: Secondary | ICD-10-CM | POA: Diagnosis not present

## 2015-12-25 DIAGNOSIS — J449 Chronic obstructive pulmonary disease, unspecified: Secondary | ICD-10-CM | POA: Diagnosis not present

## 2015-12-27 DIAGNOSIS — J449 Chronic obstructive pulmonary disease, unspecified: Secondary | ICD-10-CM | POA: Diagnosis not present

## 2015-12-27 DIAGNOSIS — I5020 Unspecified systolic (congestive) heart failure: Secondary | ICD-10-CM | POA: Diagnosis not present

## 2015-12-27 DIAGNOSIS — J45901 Unspecified asthma with (acute) exacerbation: Secondary | ICD-10-CM | POA: Diagnosis not present

## 2015-12-27 DIAGNOSIS — J432 Centrilobular emphysema: Secondary | ICD-10-CM | POA: Diagnosis not present

## 2016-01-15 DIAGNOSIS — I4891 Unspecified atrial fibrillation: Secondary | ICD-10-CM | POA: Diagnosis not present

## 2016-01-15 DIAGNOSIS — M1990 Unspecified osteoarthritis, unspecified site: Secondary | ICD-10-CM | POA: Diagnosis not present

## 2016-01-15 DIAGNOSIS — J449 Chronic obstructive pulmonary disease, unspecified: Secondary | ICD-10-CM | POA: Diagnosis not present

## 2016-01-26 DIAGNOSIS — J45901 Unspecified asthma with (acute) exacerbation: Secondary | ICD-10-CM | POA: Diagnosis not present

## 2016-01-26 DIAGNOSIS — J432 Centrilobular emphysema: Secondary | ICD-10-CM | POA: Diagnosis not present

## 2016-01-26 DIAGNOSIS — I5020 Unspecified systolic (congestive) heart failure: Secondary | ICD-10-CM | POA: Diagnosis not present

## 2016-01-26 DIAGNOSIS — J449 Chronic obstructive pulmonary disease, unspecified: Secondary | ICD-10-CM | POA: Diagnosis not present

## 2016-02-18 DIAGNOSIS — I4891 Unspecified atrial fibrillation: Secondary | ICD-10-CM | POA: Diagnosis not present

## 2016-02-18 DIAGNOSIS — I10 Essential (primary) hypertension: Secondary | ICD-10-CM | POA: Diagnosis not present

## 2016-02-18 DIAGNOSIS — J449 Chronic obstructive pulmonary disease, unspecified: Secondary | ICD-10-CM | POA: Diagnosis not present

## 2016-02-25 ENCOUNTER — Other Ambulatory Visit: Payer: Self-pay | Admitting: Oncology

## 2016-02-25 DIAGNOSIS — Z853 Personal history of malignant neoplasm of breast: Secondary | ICD-10-CM

## 2016-02-26 DIAGNOSIS — J45901 Unspecified asthma with (acute) exacerbation: Secondary | ICD-10-CM | POA: Diagnosis not present

## 2016-02-26 DIAGNOSIS — I5020 Unspecified systolic (congestive) heart failure: Secondary | ICD-10-CM | POA: Diagnosis not present

## 2016-02-26 DIAGNOSIS — J432 Centrilobular emphysema: Secondary | ICD-10-CM | POA: Diagnosis not present

## 2016-02-26 DIAGNOSIS — J449 Chronic obstructive pulmonary disease, unspecified: Secondary | ICD-10-CM | POA: Diagnosis not present

## 2016-03-13 DIAGNOSIS — M19019 Primary osteoarthritis, unspecified shoulder: Secondary | ICD-10-CM | POA: Diagnosis not present

## 2016-03-13 DIAGNOSIS — D485 Neoplasm of uncertain behavior of skin: Secondary | ICD-10-CM | POA: Diagnosis not present

## 2016-03-13 DIAGNOSIS — J449 Chronic obstructive pulmonary disease, unspecified: Secondary | ICD-10-CM | POA: Diagnosis not present

## 2016-03-13 DIAGNOSIS — L919 Hypertrophic disorder of the skin, unspecified: Secondary | ICD-10-CM | POA: Diagnosis not present

## 2016-03-13 DIAGNOSIS — I4891 Unspecified atrial fibrillation: Secondary | ICD-10-CM | POA: Diagnosis not present

## 2016-03-13 DIAGNOSIS — I5021 Acute systolic (congestive) heart failure: Secondary | ICD-10-CM | POA: Diagnosis not present

## 2016-03-24 ENCOUNTER — Ambulatory Visit
Admission: RE | Admit: 2016-03-24 | Discharge: 2016-03-24 | Disposition: A | Discharge status: Home / Self Care | Payer: PPO | Source: Ambulatory Visit | Attending: Oncology | Admitting: Oncology

## 2016-03-24 DIAGNOSIS — R922 Inconclusive mammogram: Secondary | ICD-10-CM | POA: Diagnosis not present

## 2016-03-24 DIAGNOSIS — Z853 Personal history of malignant neoplasm of breast: Secondary | ICD-10-CM

## 2016-03-28 ENCOUNTER — Telehealth: Payer: Self-pay | Admitting: Oncology

## 2016-03-28 DIAGNOSIS — J449 Chronic obstructive pulmonary disease, unspecified: Secondary | ICD-10-CM | POA: Diagnosis not present

## 2016-03-28 DIAGNOSIS — J432 Centrilobular emphysema: Secondary | ICD-10-CM | POA: Diagnosis not present

## 2016-03-28 DIAGNOSIS — I5020 Unspecified systolic (congestive) heart failure: Secondary | ICD-10-CM | POA: Diagnosis not present

## 2016-03-28 DIAGNOSIS — J45901 Unspecified asthma with (acute) exacerbation: Secondary | ICD-10-CM | POA: Diagnosis not present

## 2016-03-28 NOTE — Telephone Encounter (Signed)
Spoke with patient and confirmed the appointment change

## 2016-03-31 DIAGNOSIS — J449 Chronic obstructive pulmonary disease, unspecified: Secondary | ICD-10-CM | POA: Diagnosis not present

## 2016-03-31 DIAGNOSIS — I482 Chronic atrial fibrillation: Secondary | ICD-10-CM | POA: Diagnosis not present

## 2016-03-31 DIAGNOSIS — I509 Heart failure, unspecified: Secondary | ICD-10-CM | POA: Diagnosis not present

## 2016-03-31 DIAGNOSIS — M1990 Unspecified osteoarthritis, unspecified site: Secondary | ICD-10-CM | POA: Diagnosis not present

## 2016-04-09 ENCOUNTER — Ambulatory Visit: Payer: PPO | Admitting: Oncology

## 2016-04-20 ENCOUNTER — Encounter (HOSPITAL_COMMUNITY): Payer: Self-pay | Admitting: Emergency Medicine

## 2016-04-20 ENCOUNTER — Emergency Department (HOSPITAL_COMMUNITY)
Admission: EM | Admit: 2016-04-20 | Discharge: 2016-04-20 | Disposition: A | Discharge status: Home / Self Care | Payer: PPO | Attending: Emergency Medicine | Admitting: Emergency Medicine

## 2016-04-20 ENCOUNTER — Emergency Department (HOSPITAL_COMMUNITY): Payer: PPO

## 2016-04-20 DIAGNOSIS — M545 Low back pain: Secondary | ICD-10-CM | POA: Diagnosis not present

## 2016-04-20 DIAGNOSIS — I5033 Acute on chronic diastolic (congestive) heart failure: Secondary | ICD-10-CM | POA: Diagnosis not present

## 2016-04-20 DIAGNOSIS — R030 Elevated blood-pressure reading, without diagnosis of hypertension: Secondary | ICD-10-CM | POA: Diagnosis not present

## 2016-04-20 DIAGNOSIS — J449 Chronic obstructive pulmonary disease, unspecified: Secondary | ICD-10-CM | POA: Diagnosis not present

## 2016-04-20 DIAGNOSIS — M5441 Lumbago with sciatica, right side: Secondary | ICD-10-CM | POA: Insufficient documentation

## 2016-04-20 DIAGNOSIS — Y939 Activity, unspecified: Secondary | ICD-10-CM | POA: Insufficient documentation

## 2016-04-20 DIAGNOSIS — Y929 Unspecified place or not applicable: Secondary | ICD-10-CM | POA: Insufficient documentation

## 2016-04-20 DIAGNOSIS — I130 Hypertensive heart and chronic kidney disease with heart failure and stage 1 through stage 4 chronic kidney disease, or unspecified chronic kidney disease: Secondary | ICD-10-CM | POA: Insufficient documentation

## 2016-04-20 DIAGNOSIS — N183 Chronic kidney disease, stage 3 (moderate): Secondary | ICD-10-CM | POA: Diagnosis not present

## 2016-04-20 DIAGNOSIS — Z96641 Presence of right artificial hip joint: Secondary | ICD-10-CM | POA: Diagnosis not present

## 2016-04-20 DIAGNOSIS — Z87891 Personal history of nicotine dependence: Secondary | ICD-10-CM | POA: Diagnosis not present

## 2016-04-20 DIAGNOSIS — Z79899 Other long term (current) drug therapy: Secondary | ICD-10-CM | POA: Insufficient documentation

## 2016-04-20 DIAGNOSIS — W1830XA Fall on same level, unspecified, initial encounter: Secondary | ICD-10-CM | POA: Insufficient documentation

## 2016-04-20 DIAGNOSIS — Z7901 Long term (current) use of anticoagulants: Secondary | ICD-10-CM | POA: Diagnosis not present

## 2016-04-20 MED ORDER — OXYCODONE-ACETAMINOPHEN 5-325 MG PO TABS: 1.0000 | ORAL_TABLET | ORAL | 0 refills | Status: DC | PRN

## 2016-04-20 MED ORDER — PANTOPRAZOLE SODIUM 40 MG IV SOLR
40.0000 mg | Freq: Once | INTRAVENOUS | Status: AC
  Administered 2016-04-20: 40 mg via INTRAVENOUS
  Filled 2016-04-20: qty 40

## 2016-04-20 MED ORDER — METHYLPREDNISOLONE SODIUM SUCC 125 MG IJ SOLR
60.0000 mg | Freq: Once | INTRAMUSCULAR | Status: AC
  Administered 2016-04-20: 60 mg via INTRAVENOUS
  Filled 2016-04-20: qty 2

## 2016-04-20 MED ORDER — PREDNISONE 20 MG PO TABS: 20.0000 mg | ORAL_TABLET | Freq: Every day | ORAL | 0 refills | Status: DC

## 2016-04-20 MED ORDER — SODIUM CHLORIDE 0.9 % IV BOLUS (SEPSIS)
500.0000 mL | Freq: Once | INTRAVENOUS | Status: AC
  Administered 2016-04-20: 500 mL via INTRAVENOUS

## 2016-04-20 MED ORDER — MORPHINE SULFATE (PF) 4 MG/ML IV SOLN
4.0000 mg | Freq: Once | INTRAVENOUS | Status: AC
  Administered 2016-04-20: 4 mg via INTRAVENOUS
  Filled 2016-04-20: qty 1

## 2016-04-20 MED ORDER — ONDANSETRON HCL 4 MG/2ML IJ SOLN
4.0000 mg | Freq: Once | INTRAMUSCULAR | Status: AC
  Administered 2016-04-20: 4 mg via INTRAVENOUS
  Filled 2016-04-20: qty 2

## 2016-04-20 NOTE — ED Triage Notes (Signed)
Patient coming from c/o right hip pain. Pt did not had a fall today but she fell two weeks ago and she was never evaluated at the time. Pt state her hip pain started thrursday. Pt state she have not been able to walk on it. Her bp 232/100 by ems. Pt state she have not take her BP med this morning. 182/82 last bp with ems.

## 2016-04-20 NOTE — Discharge Instructions (Signed)
X-ray shows a degenerative spine. This is putting pressure on your nerve which is radiating down the leg. Prescription for pain medicine and prednisone. Follow-up your primary care doctor. You may need an MRI at some point to further investigate your pain

## 2016-04-20 NOTE — ED Provider Notes (Signed)
[facility] DEPT Provider Note   CSN: [id number] Arrival date & time: 04/20/16  6789     History   Chief Complaint Chief Complaint  Patient presents with  . Hip Pain    HPI Jordan Byrd is a 83 y.o. female.  Patient complains of pain in her lower back radiating to the right lower extremity. No bowel or bladder incontinence. No trauma. She is having trouble ambulating and sitting and standing. She has had previous back surgery, but unknown variety. She lives at home by herself. Severity of pain is moderate. Any movement makes pain worse.      Past Medical History:  Diagnosis Date  . Anemia   . Ankle swelling   . Anxiety    pt denies this  . Arthritis   . Asthma   . Atrial fibrillation ([place])    a. 04/04/11  . Breast cancer ([place]) 05/19/13   left breast stage IIA   . Bronchitis   . Cataracts, both eyes   . CHF (congestive heart failure) ([place])   . Chronic headaches    no longer having headaches  . Excessive urination at night   . Frequent urination   . GERD (gastroesophageal reflux disease)    [name] Prilosec  . Hypertension   . Kidney infection   . Macular degeneration   . Osteoarthritis    a. 03/28/11 - R Total Hip Arthroplasty  . Pleurisy   . Pneumonia   . Shortness of breath    doe-  . Weight gain     Patient Active Problem List   Diagnosis Date Noted  . Shoulder pain, left   . COPD exacerbation ([place]) 02/09/2015  . Chronic respiratory failure with hypoxia ([place]) 06/15/2014  . Acute respiratory failure with hypoxia ([place]) 05/24/2014  . Influenza A H1N1 infection 05/14/2014  . COPD with exacerbation ([place]) 05/13/2014  . Sleep-disordered breathing 03/11/2014  . Malignant neoplasm of breast (female), unspecified site 07/06/2013  . Family history of malignant neoplasm of breast 07/06/2013  . Breast cancer of upper-outer quadrant of left female breast ([place]) 05/20/2013  . Anemia 03/07/2013  . COPD mixed type ([place]) 10/28/2012  . Chest tightness 10/28/2012    . HCAP (healthcare-associated pneumonia) 09/19/2012  . Chronic anticoagulation 09/19/2012  . Acute on chronic diastolic heart failure ([place]) 07/12/2012  . Dizziness 06/26/2012  . CKD (chronic kidney disease) stage 3, GFR 30-59 ml/min 06/26/2012  . Chronic diastolic heart failure ([place]) 06/26/2012  . AKI (acute kidney injury) ([place]) 06/17/2012  . Acute respiratory distress 06/14/2012  . Acute asthma exacerbation 06/14/2012  . UTI (lower urinary tract infection) 04/04/2011  . Arthritis   . Anxiety   . Hypertension   . Paroxysmal atrial fibrillation [facility])     Past Surgical History:  Procedure Laterality Date  . ABDOMINAL HYSTERECTOMY  1962  . ADENOIDECTOMY  1949  . APPENDECTOMY  1954  . BACK SURGERY  1990  . BREAST LUMPECTOMY WITH NEEDLE LOCALIZATION Left 07/14/2013   Procedure: BREAST LUMPECTOMY WITH NEEDLE LOCALIZATION;  Surgeon: Stark Klein, MD;  Location: [facility];  Service: General;  Laterality: Left;  . CARDIAC CATHETERIZATION     1980's or 90's - reportedly normal  . CARDIOVERSION  04/05/2011   Procedure: CARDIOVERSION;  Surgeon: Coralyn Mark, MD;  Location: [facility];  Service: Cardiovascular;  Laterality: N/A;  . CARDIOVERSION N/A 06/19/2012   Procedure: CARDIOVERSION-bedside([id number]);  Surgeon: Lelon Perla, MD;  Location: [facility];  Service: Cardiovascular;  Laterality: N/A;  . CATARACT EXTRACTION BILATERAL  W/ ANTERIOR VITRECTOMY  2007/2008  . CHOLECYSTECTOMY    . COLONOSCOPY    . EXCISION / BIOPSY BREAST / NIPPLE / DUCT     left breast  . FOOT SURGERY  1947  . KNEE SURGERY      both knees  . [place] SURGERY  2010  . SHOULDER SURGERY     left  . [place] SURGERY  2008  . TONSILLECTOMY  1943  . TOTAL HIP ARTHROPLASTY  03/28/2011   Procedure: TOTAL HIP ARTHROPLASTY;  Surgeon: Yvette Rack., MD;  Location: [facility];  Service: Orthopedics;  Laterality: Right;  . TOTAL HIP ARTHROPLASTY  03/28/2011   right    OB History    Obstetric Comments   Menses age 24 No  pregnancies 3 step-children       Home Medications    Prior to Admission medications   Medication Sig Start Date End Date Taking? Authorizing Provider  acetaminophen (TYLENOL) 500 MG tablet Take 1,000 mg by mouth 2 (two) times daily.   Yes Historical Provider, MD  albuterol (PROVENTIL HFA;VENTOLIN HFA) 108 (90 BASE) MCG/ACT inhaler Inhale 1 puff into the lungs every 6 (six) hours as needed for wheezing. Reported on 04/10/2015   Yes Historical Provider, MD  amLODipine (NORVASC) 10 MG tablet Take 10 mg by mouth daily.   Yes Historical Provider, MD  budesonide-formoterol (SYMBICORT) 160-4.5 MCG/ACT inhaler Inhale 2 puffs into the lungs See admin instructions. Inhale 1 puff every morning, may inhale a 2nd puff in the evening if needed for wheezing   Yes Historical Provider, MD  flecainide (TAMBOCOR) 50 MG tablet Take 1 tablet (50 mg total) by mouth every 12 (twelve) hours. 04/25/15  Yes Thompson Grayer, MD  furosemide (LASIX) 20 MG tablet Take 1 tablet (20 mg total) by mouth daily. 02/15/15  Yes Maryann Mikhail, DO  losartan (COZAAR) 100 MG tablet Take 100 mg by mouth daily.   Yes Historical Provider, MD  Multiple Vitamin (MULTIVITAMIN WITH MINERALS) TABS tablet Take 1 tablet by mouth daily.   Yes Historical Provider, MD  Multiple Vitamins-Minerals (ICAPS) CAPS Take 1 capsule by mouth daily.   Yes Historical Provider, MD  Nebivolol HCl (BYSTOLIC) 20 MG TABS Take 1 tablet (20 mg total) by mouth daily. 10/24/15  Yes Thompson Grayer, MD  nitroGLYCERIN (NITROSTAT) 0.4 MG SL tablet Place 0.4 mg under the tongue every 5 (five) minutes as needed for chest pain.   Yes Historical Provider, MD  omeprazole (PRILOSEC) 20 MG capsule Take 20 mg by mouth daily.   Yes Historical Provider, MD  potassium chloride (K-DUR) 10 MEQ tablet Take 10 tablets by mouth daily. 04/10/15  Yes Historical Provider, MD  rivaroxaban (XARELTO) 20 MG TABS tablet Take 1 tablet (20 mg total) by mouth daily with supper. Patient taking differently:  Take 20 mg by mouth daily with breakfast.  10/24/15  Yes Thompson Grayer, MD  traMADol (ULTRAM) 50 MG tablet Take 50 mg by mouth 2 (two) times daily.   Yes Historical Provider, MD  oxyCODONE-acetaminophen (PERCOCET) 5-325 MG tablet Take 1 tablet by mouth every 4 (four) hours as needed. 04/20/16   Nat Christen, MD  predniSONE (DELTASONE) 20 MG tablet Take 1 tablet (20 mg total) by mouth daily with breakfast. 1 tablet daily for 7 days, one half tablet daily for 7 days 04/20/16   Nat Christen, MD    Family History Family History  Problem Relation Age of Onset  . Diabetes Father     Father, Mother, 10  sisters (2 living)  . Heart attack Father     (Deceased)  . Heart failure Father     (deceased 41)  . Hypertension Father   . Stroke Mother     (deceased 36)  . Hypertension Mother   . Cancer Sister 14    breast cancer  . Heart attack Sister   . Diabetes Sister   . Cancer Other 26    niece with breast cancer    Social History Social History  Substance Use Topics  . Smoking status: Former Smoker    Packs/day: 1.00    Years: 21.00    Types: Cigarettes    Quit date: 08/15/1970  . Smokeless tobacco: Never Used  . Alcohol use No     Allergies   Methocarbamol; Vicodin [hydrocodone-acetaminophen]; Aspirin; Butazolidin [phenylbutazone]; Celebrex [celecoxib]; Codeine; Doripenem; Aspirin buf(alhyd-mghyd-cacar); Mucinex [guaifenesin er]; and Temazepam   Review of Systems Review of Systems  All other systems reviewed and are negative.    Physical Exam Updated Vital Signs BP (!) 142/58 (BP Location: Right Arm)   Pulse 62   Temp 99 F (37.2 C) (Oral)   Resp 14   SpO2 93%   Physical Exam  Constitutional: She is oriented to person, place, and time.  Obese  HENT:  Head: Normocephalic and atraumatic.  Eyes: Conjunctivae are normal.  Neck: Neck supple.  Cardiovascular: Normal rate and regular rhythm.   Pulmonary/Chest: Effort normal and breath sounds normal.  Abdominal: Soft. Bowel  sounds are normal.  Musculoskeletal:  Minimal tenderness in the lower lumbar region. Pain with straight leg raise right greater than left.  Neurological: She is alert and oriented to person, place, and time.  Skin: Skin is warm and dry.  Psychiatric: She has a normal mood and affect. Her behavior is normal.  Nursing note and vitals reviewed.    ED Treatments / Results  Labs (all labs ordered are listed, but only abnormal results are displayed) Labs Reviewed - No data to display  EKG  EKG Interpretation None       Radiology Dg Lumbar Spine Complete  Result Date: 04/20/2016 CLINICAL DATA:  Lumbar pain 4 days. No injury. Previous lumbar laminectomy. EXAM: LUMBAR SPINE - COMPLETE 4+ VIEW COMPARISON:  09/15/2011 FINDINGS: Vertebral body alignment and heights are within normal. There is moderate spondylosis of the lumbar spine to include facet arthropathy. Severe disc space narrowing at the L2-3 level unchanged. Lesser degree of disc space narrowing at the L3-4, L4-5 and L5-S1 levels. No compression fracture or subluxation. Evidence of previous laminectomy L3-L5. Calcified plaque over the abdominal aorta. IMPRESSION: No acute findings. Moderate spondylosis of the lumbar spinal multilevel disc disease worse at the L2-3 level. Aortic atherosclerosis. Electronically Signed   By: Marin Olp M.D.   On: 04/20/2016 10:18    Procedures Procedures (including critical care time)  Medications Ordered in ED Medications  sodium chloride 0.9 % bolus 500 mL (500 mLs Intravenous New Bag/Given 04/20/16 1009)  ondansetron (ZOFRAN) injection 4 mg (4 mg Intravenous Given 04/20/16 1009)  morphine 4 MG/ML injection 4 mg (4 mg Intravenous Given 04/20/16 1009)  methylPREDNISolone sodium succinate (SOLU-MEDROL) 125 mg/2 mL injection 60 mg (60 mg Intravenous Given 04/20/16 1009)  pantoprazole (PROTONIX) injection 40 mg (40 mg Intravenous Given 04/20/16 1219)     Initial Impression / Assessment and Plan / ED  Course  I have reviewed the triage vital signs and the nursing notes.  Pertinent labs & imaging results that were available during my care of  the patient were reviewed by me and considered in my medical decision making (see chart for details).     I suspect that this pain is related to a nerve compression syndrome. Pain was relieved by IV morphine, steroids. Will discharge with Percocet and prednisone. She will follow-up with her primary care doctor. She may need MRI at some point. This was discussed with the patient and her family members.  Final Clinical Impressions(s) / ED Diagnoses   Final diagnoses:  Acute right-sided low back pain with right-sided sciatica    New Prescriptions New Prescriptions   OXYCODONE-ACETAMINOPHEN (PERCOCET) 5-325 MG TABLET    Take 1 tablet by mouth every 4 (four) hours as needed.   PREDNISONE (DELTASONE) 20 MG TABLET    Take 1 tablet (20 mg total) by mouth daily with breakfast. 1 tablet daily for 7 days, one half tablet daily for 7 days     Nat Christen, MD 04/20/16 1249

## 2016-04-20 NOTE — ED Notes (Signed)
Bed: [facility] Expected date:  Expected time:  Means of arrival:  Comments: 22 open

## 2016-04-20 NOTE — ED Notes (Signed)
Bed: WA06 Expected date:  Expected time:  Means of arrival:  Comments: EMS 83 yo

## 2016-04-21 ENCOUNTER — Ambulatory Visit: Payer: PPO | Admitting: Oncology

## 2016-04-24 DIAGNOSIS — I10 Essential (primary) hypertension: Secondary | ICD-10-CM | POA: Diagnosis not present

## 2016-04-24 DIAGNOSIS — M1990 Unspecified osteoarthritis, unspecified site: Secondary | ICD-10-CM | POA: Diagnosis not present

## 2016-04-24 DIAGNOSIS — J441 Chronic obstructive pulmonary disease with (acute) exacerbation: Secondary | ICD-10-CM | POA: Diagnosis not present

## 2016-04-25 DIAGNOSIS — J449 Chronic obstructive pulmonary disease, unspecified: Secondary | ICD-10-CM | POA: Diagnosis not present

## 2016-04-25 DIAGNOSIS — J45901 Unspecified asthma with (acute) exacerbation: Secondary | ICD-10-CM | POA: Diagnosis not present

## 2016-04-25 DIAGNOSIS — I5020 Unspecified systolic (congestive) heart failure: Secondary | ICD-10-CM | POA: Diagnosis not present

## 2016-04-25 DIAGNOSIS — J432 Centrilobular emphysema: Secondary | ICD-10-CM | POA: Diagnosis not present

## 2016-04-29 ENCOUNTER — Telehealth: Payer: Self-pay | Admitting: *Deleted

## 2016-04-29 NOTE — Telephone Encounter (Signed)
To follow up on previous entry Jordan Byrd called and left message with " on call " [facility] ) stating she need to cancel.  Communication not received until post appointment being documented as no show.  Informed pt above will be reflected.  Note per need to reschedule yearly follow up for ER/PR breast cancer diagnosed 2015 with pt declining anti estrogen therapy - pt states she is unsure when she can reschedule due to severe pain secondary to degenerative disc disease.  She is maintaining appropriate mammograms with most recent in Fer 2018.  This RN will forward this note to MD for communication of above situation.

## 2016-04-29 NOTE — Telephone Encounter (Signed)
This RN spoke with pt VM left from pt stating concern per her appointment noted as NO SHOW on 04/21/2016.

## 2016-05-22 ENCOUNTER — Other Ambulatory Visit: Payer: Self-pay | Admitting: Pharmacist

## 2016-05-22 NOTE — Patient Outreach (Signed)
[facility]) Care Management  05/22/2016  RYONNA CIMINI Jul 09, 1933 [id number]  Message received from patient requesting a return call from [place].    Returned call to patient, HIPAA details verified.  Patient reports she is needing assistance in applying for manufacturer patient assistance for:  Xarelto, Symbicort, and Bystolic.    Patient reports she had received the applications from the respective manufacturer patient assistance programs and is working to get them completed.  Patient reports she has [employer] MA-PDP and reports her PCP is still Dr Grayland Ormond.    Counseled patient Xarelto [name] and [employer]) and Symbicort (AZ&Me) patient assistance programs both have out-of-pocket prescription spend requirements based on a percentage of household income---she reports she doesn't believe she has reached the requirements yet.    Patient reports she needs her PCP to sign/date and provide prescription for [facility]) application yet.  Counseled patient to be sure she answers all questions on [employer] application.    Patient reports she plans to have family drop off applications to [facility] main office.    Plan:  Will contact patient's insurance to verify out-of-pocket spend year to date.   Will continue to follow-up with patient regarding patient assistance applications.    Karrie Meres, PharmD, [place] [phone]

## 2016-05-26 DIAGNOSIS — J45901 Unspecified asthma with (acute) exacerbation: Secondary | ICD-10-CM | POA: Diagnosis not present

## 2016-05-26 DIAGNOSIS — I5020 Unspecified systolic (congestive) heart failure: Secondary | ICD-10-CM | POA: Diagnosis not present

## 2016-05-26 DIAGNOSIS — J449 Chronic obstructive pulmonary disease, unspecified: Secondary | ICD-10-CM | POA: Diagnosis not present

## 2016-05-26 DIAGNOSIS — J432 Centrilobular emphysema: Secondary | ICD-10-CM | POA: Diagnosis not present

## 2016-05-27 ENCOUNTER — Other Ambulatory Visit: Payer: Self-pay | Admitting: Pharmacist

## 2016-05-27 NOTE — Patient Outreach (Signed)
[facility]) Care Management  05/27/2016  Jordan Byrd [birth year] [id number]  Follow-up phone call to patient regarding patient assistance applications/options.  Discussed with patient as of 05/22/16 she had spent $285.48 in out-of-pocket prescription costs for 2018 and the requirements for patient assistance programs [name] and [name]---Xarelto likely 4% and AZ&Me---Symbicort 3%).  Counseled patient to complete [employer] application (Bystolic) and submit for evaluation.   Counseled patient if Symbicort becomes too expensive in the coverage gap, could review alternative agents with her prescriber to see if prescriber feels an alternative agent with a different manufacturer assistance program would be appropriate.    Counseled patient to let [facility] Pharmacist know when she feels she reaches the required out-of-pocket spend requirements or if she has questions/concerns.   Patient verbalized understanding and reports the patient assistance applications may be dropped off to [facility] by her family member.    Plan:  Will await patient call as noted above---if no call from patient, will plan to follow-up via phone in the next month.   Karrie Meres, PharmD, [place] [phone]

## 2016-05-29 ENCOUNTER — Other Ambulatory Visit: Payer: Self-pay | Admitting: Pharmacist

## 2016-05-29 NOTE — Patient Outreach (Signed)
[facility]) Care Management  05/29/2016  Jordan Byrd May 21, 1933 [id number]  Successful phone outreach to patient.  Discussed with patient application for Bystolic patient assistance was received but needs prescriber date and signature.  Advised patient [facility] Pharmacist would take application to Dr Iva Lento office for completion of application and have sent to [place] patient assistance for patient eligibility evaluation.   Patient reports she has completed AZ&Me and [employer] Patient [place] applications and is aware she needs to meet out-of-pocket prescription spend to apply.  When she reaches to required out-of-pocket spend, she reports she plans to apply.   Plan:  Continue to follow-up patient's application for patient assistance.   Karrie Meres, PharmD, [place] [phone]

## 2016-06-16 ENCOUNTER — Other Ambulatory Visit: Payer: Self-pay | Admitting: Pharmacist

## 2016-06-16 DIAGNOSIS — R8290 Unspecified abnormal findings in urine: Secondary | ICD-10-CM | POA: Diagnosis not present

## 2016-06-16 DIAGNOSIS — J449 Chronic obstructive pulmonary disease, unspecified: Secondary | ICD-10-CM | POA: Diagnosis not present

## 2016-06-16 DIAGNOSIS — I509 Heart failure, unspecified: Secondary | ICD-10-CM | POA: Diagnosis not present

## 2016-06-16 DIAGNOSIS — I4891 Unspecified atrial fibrillation: Secondary | ICD-10-CM | POA: Diagnosis not present

## 2016-06-16 DIAGNOSIS — I10 Essential (primary) hypertension: Secondary | ICD-10-CM | POA: Diagnosis not present

## 2016-06-16 DIAGNOSIS — Z0000 Encounter for general adult medical examination without abnormal findings: Secondary | ICD-10-CM | POA: Diagnosis not present

## 2016-06-16 NOTE — Patient Outreach (Signed)
[facility]) Care Management  06/16/2016  Jordan Byrd [birth year] [id number]  Follow-up call to patient regarding application for [employer] Patient Assistance for Bystolic.  Patient reports she has not heard from PCP regarding Bystolic.  Counseled patient per Aller[employer]'s automated system, her application was approved and medication was shipped to prescriber 06/09/16.    Phone call to Dr Iva Lento office----office reports they did not receive medication yet.  Phone call to [employer] Patient Assistance program, representative confirmed patient approved through 07/27/74 for Bystolic patient assistance, medication shipped 06/09/16 to prescriber's office and signed for on 06/12/16 by someone with initials of W.C.   Phone call back to prescriber office, informed office of delivery information obtained from [place] Patient Assistance---office reports they located medication and will contact patient to pick it up.   Plan:  Will follow-up with patient to ensure she obtains her manufacturer supplied Bystolic patient assistance.   Will continue to work with patient regarding her patient assistance concerns for other medications as well.   Karrie Meres, PharmD, [place] [phone]

## 2016-06-17 ENCOUNTER — Other Ambulatory Visit: Payer: Self-pay | Admitting: Pharmacist

## 2016-06-17 ENCOUNTER — Telehealth: Payer: Self-pay | Admitting: Internal Medicine

## 2016-06-17 NOTE — Telephone Encounter (Signed)
New message    Pt is calling to explain why she cant make an appointment to RN.

## 2016-06-17 NOTE — Patient Outreach (Signed)
[place] [facility]) Care Management  06/17/2016  Jordan Byrd Mar 11, 1933 [id number]  Successful phone outreach to patient, HIPAA details verified.  Patient reports PCP office notified her that her Bystolic patient assistance was at PCP office for her to pick-up.  She reports family member will pick-it up.  Reminded patient refills are required to go through PCP office for patient assistance with Bystolic.    Counseled patient she still needs to meet the out-of-pocket spend requirements for Xarelto and Symbicort before she can apply for assistance with these medications from manufacturer patient assistance programs.    Patient was counseled to call [facility] Pharmacist when she feels she reaches required out-of-pocket spend requirements.    Plan:  Will place follow-up call to patient next month to continue to follow-up with her regarding patient assistance programs for Xarelto and Symbicort.   Karrie Meres, PharmD, [place] [phone]

## 2016-06-19 NOTE — Telephone Encounter (Signed)
Attempt to return call- no answer, unable to leave VM 

## 2016-06-25 DIAGNOSIS — J449 Chronic obstructive pulmonary disease, unspecified: Secondary | ICD-10-CM | POA: Diagnosis not present

## 2016-06-25 DIAGNOSIS — J432 Centrilobular emphysema: Secondary | ICD-10-CM | POA: Diagnosis not present

## 2016-06-25 DIAGNOSIS — I5020 Unspecified systolic (congestive) heart failure: Secondary | ICD-10-CM | POA: Diagnosis not present

## 2016-06-25 DIAGNOSIS — J45901 Unspecified asthma with (acute) exacerbation: Secondary | ICD-10-CM | POA: Diagnosis not present

## 2016-07-07 DIAGNOSIS — I110 Hypertensive heart disease with heart failure: Secondary | ICD-10-CM | POA: Diagnosis not present

## 2016-07-07 DIAGNOSIS — G9009 Other idiopathic peripheral autonomic neuropathy: Secondary | ICD-10-CM | POA: Diagnosis not present

## 2016-07-07 DIAGNOSIS — J449 Chronic obstructive pulmonary disease, unspecified: Secondary | ICD-10-CM | POA: Diagnosis not present

## 2016-07-07 DIAGNOSIS — R2689 Other abnormalities of gait and mobility: Secondary | ICD-10-CM | POA: Diagnosis not present

## 2016-07-07 DIAGNOSIS — I509 Heart failure, unspecified: Secondary | ICD-10-CM | POA: Diagnosis not present

## 2016-07-07 DIAGNOSIS — Z7901 Long term (current) use of anticoagulants: Secondary | ICD-10-CM | POA: Diagnosis not present

## 2016-07-14 ENCOUNTER — Other Ambulatory Visit: Payer: Self-pay | Admitting: Pharmacist

## 2016-07-14 NOTE — Patient Outreach (Signed)
[place] [facility]) Care Management  07/14/2016  Jordan Byrd 1933-04-10 [id number]  Successful phone follow-up with patient regarding patient assistance for Symbicort and Xarelto.   Patient reports she believes she is in the coverage gap, she reports recently filling a 90 day supply of Xarelto and Symbicort.  Based on patient report, she may have reached 3% out-of-pocket year to date prescription spend, which is requirement for [employer] AZ&Me Patient Assistance Program.    Patient reports she has the completed [place] patient assistance application.   Plan:  Will verify patient's out-of-pocket spend and call her back.   Karrie Meres, PharmD, [place] [phone]

## 2016-07-21 ENCOUNTER — Other Ambulatory Visit: Payer: Self-pay | Admitting: Pharmacist

## 2016-07-21 DIAGNOSIS — I110 Hypertensive heart disease with heart failure: Secondary | ICD-10-CM | POA: Diagnosis not present

## 2016-07-21 DIAGNOSIS — R2689 Other abnormalities of gait and mobility: Secondary | ICD-10-CM | POA: Diagnosis not present

## 2016-07-21 DIAGNOSIS — J449 Chronic obstructive pulmonary disease, unspecified: Secondary | ICD-10-CM | POA: Diagnosis not present

## 2016-07-21 DIAGNOSIS — Z7901 Long term (current) use of anticoagulants: Secondary | ICD-10-CM | POA: Diagnosis not present

## 2016-07-21 DIAGNOSIS — G9009 Other idiopathic peripheral autonomic neuropathy: Secondary | ICD-10-CM | POA: Diagnosis not present

## 2016-07-21 DIAGNOSIS — I509 Heart failure, unspecified: Secondary | ICD-10-CM | POA: Diagnosis not present

## 2016-07-21 NOTE — Patient Outreach (Signed)
[place] [facility]) Care Management  07/21/2016  Jordan Byrd Aug 06, 1933 [id number]  Successful phone follow-up to patient regarding patient assistance.  Discussed with patient she may have spent 3% of household income on her prescriptions in calendar year 2018 but she has not reached 4% out-of-pocket prescription spend requirement for [name] and [name].   Patient reports she plans to have her grandson drop off [name] paperwork to [name] office so it can be submitted to see if she is eligible for manufacturer assistance for Symbicort.  Patient reports she has all her medication at this time.    Discussed with patient she could see how much a smaller quantity of Xarelto costs with her next refill to see if she may be able to then submit application for Xarelto manufacturer patient assistance.   Plan:  Will continue to follow-up with patient regarding patient assistance applications.   Karrie Meres, PharmD, [place] [phone]

## 2016-07-24 DIAGNOSIS — G9009 Other idiopathic peripheral autonomic neuropathy: Secondary | ICD-10-CM | POA: Diagnosis not present

## 2016-07-24 DIAGNOSIS — J449 Chronic obstructive pulmonary disease, unspecified: Secondary | ICD-10-CM | POA: Diagnosis not present

## 2016-07-24 DIAGNOSIS — I110 Hypertensive heart disease with heart failure: Secondary | ICD-10-CM | POA: Diagnosis not present

## 2016-07-24 DIAGNOSIS — R2689 Other abnormalities of gait and mobility: Secondary | ICD-10-CM | POA: Diagnosis not present

## 2016-07-24 DIAGNOSIS — I509 Heart failure, unspecified: Secondary | ICD-10-CM | POA: Diagnosis not present

## 2016-07-24 DIAGNOSIS — Z7901 Long term (current) use of anticoagulants: Secondary | ICD-10-CM | POA: Diagnosis not present

## 2016-07-26 DIAGNOSIS — I5020 Unspecified systolic (congestive) heart failure: Secondary | ICD-10-CM | POA: Diagnosis not present

## 2016-07-26 DIAGNOSIS — J432 Centrilobular emphysema: Secondary | ICD-10-CM | POA: Diagnosis not present

## 2016-07-26 DIAGNOSIS — J449 Chronic obstructive pulmonary disease, unspecified: Secondary | ICD-10-CM | POA: Diagnosis not present

## 2016-07-26 DIAGNOSIS — J45901 Unspecified asthma with (acute) exacerbation: Secondary | ICD-10-CM | POA: Diagnosis not present

## 2016-07-31 ENCOUNTER — Other Ambulatory Visit: Payer: Self-pay | Admitting: Pharmacist

## 2016-07-31 NOTE — Patient Outreach (Signed)
[place] [facility]) Care Management  07/31/2016  Jordan Byrd [birth year] [id number]  Successful phone outreach to patient.  HIPAA details verified. Let patient know application for [place] patient assistance (Symbicort) was taken to prescriber's office to have prescriber office, Dr Grayland Ormond, fax for evaluation.   Advised patient will continue to follow-up with her regarding out-of-pocket spend for Xarelto patient assistance.  Plan:  Will follow-up with patient and patient assistance application for Symbicort next week.   Karrie Meres, PharmD, [place] [phone]

## 2016-08-04 DIAGNOSIS — R2689 Other abnormalities of gait and mobility: Secondary | ICD-10-CM | POA: Diagnosis not present

## 2016-08-04 DIAGNOSIS — I509 Heart failure, unspecified: Secondary | ICD-10-CM | POA: Diagnosis not present

## 2016-08-04 DIAGNOSIS — Z7901 Long term (current) use of anticoagulants: Secondary | ICD-10-CM | POA: Diagnosis not present

## 2016-08-04 DIAGNOSIS — I110 Hypertensive heart disease with heart failure: Secondary | ICD-10-CM | POA: Diagnosis not present

## 2016-08-04 DIAGNOSIS — J449 Chronic obstructive pulmonary disease, unspecified: Secondary | ICD-10-CM | POA: Diagnosis not present

## 2016-08-04 DIAGNOSIS — G9009 Other idiopathic peripheral autonomic neuropathy: Secondary | ICD-10-CM | POA: Diagnosis not present

## 2016-08-07 ENCOUNTER — Ambulatory Visit: Payer: Self-pay | Admitting: Pharmacist

## 2016-08-07 ENCOUNTER — Other Ambulatory Visit: Payer: Self-pay | Admitting: Pharmacist

## 2016-08-07 NOTE — Patient Outreach (Signed)
[facility]) Care Management  08/07/2016  Jordan Byrd January 25, 1934 [id number]  Successful phone outreach to patient to follow-up on application for [place] patient assistance Dr Iva Lento office faxed in ~10 days ago.    Phone call to AZ&Me patient assistance program with patient.  Representative reports application was processed and approved for Symbicort effective 08/07/16-02/02/17.  Representative reports patient should receive shipment of #3 inhalers, 90 day supply in 7-10 business days from [employer].    Call with patient assistance program was disconnected.    Patient reports she has still not met required out-of-pocket prescription spend requirement for Xarelto patient assistance.   Plan: Follow-up call to patient in the next 2 weeks to see if she received Symbicort patient assistance.   Karrie Meres, PharmD, [place] [phone]

## 2016-08-19 DIAGNOSIS — I4891 Unspecified atrial fibrillation: Secondary | ICD-10-CM | POA: Diagnosis not present

## 2016-08-19 DIAGNOSIS — M1990 Unspecified osteoarthritis, unspecified site: Secondary | ICD-10-CM | POA: Diagnosis not present

## 2016-08-19 DIAGNOSIS — J449 Chronic obstructive pulmonary disease, unspecified: Secondary | ICD-10-CM | POA: Diagnosis not present

## 2016-08-25 DIAGNOSIS — I5020 Unspecified systolic (congestive) heart failure: Secondary | ICD-10-CM | POA: Diagnosis not present

## 2016-08-25 DIAGNOSIS — J432 Centrilobular emphysema: Secondary | ICD-10-CM | POA: Diagnosis not present

## 2016-08-25 DIAGNOSIS — J449 Chronic obstructive pulmonary disease, unspecified: Secondary | ICD-10-CM | POA: Diagnosis not present

## 2016-08-25 DIAGNOSIS — J45901 Unspecified asthma with (acute) exacerbation: Secondary | ICD-10-CM | POA: Diagnosis not present

## 2016-09-16 DIAGNOSIS — H52223 Regular astigmatism, bilateral: Secondary | ICD-10-CM | POA: Diagnosis not present

## 2016-09-16 DIAGNOSIS — H353133 Nonexudative age-related macular degeneration, bilateral, advanced atrophic without subfoveal involvement: Secondary | ICD-10-CM | POA: Diagnosis not present

## 2016-09-23 ENCOUNTER — Other Ambulatory Visit: Payer: Self-pay | Admitting: Pharmacist

## 2016-09-23 NOTE — Patient Outreach (Signed)
[facility]) Care Management  09/23/2016  GINA LEBLOND [birth year] [id number]  Received missed call from patient and returned call to patient. HIPAA details verified.  Patient reports she believes she has reached out-of-pocket spend requirement to apply for manufacturer assistance with rivaroxaban.    Advised patient her application from her MD office is missing patient and MD signature pages.   Plan:  Verified address in chart is correct---will mail patient portion of application to complete and return to [facility] Pharmacist.   Will get prescriber portion to MD office.   Will contact her insurance company to verify out-of-pocket prescription spend.   Karrie Meres, PharmD, [place] [phone]

## 2016-09-25 ENCOUNTER — Encounter: Payer: Self-pay | Admitting: Pharmacist

## 2016-09-25 DIAGNOSIS — J45901 Unspecified asthma with (acute) exacerbation: Secondary | ICD-10-CM | POA: Diagnosis not present

## 2016-09-25 DIAGNOSIS — J449 Chronic obstructive pulmonary disease, unspecified: Secondary | ICD-10-CM | POA: Diagnosis not present

## 2016-09-25 DIAGNOSIS — J432 Centrilobular emphysema: Secondary | ICD-10-CM | POA: Diagnosis not present

## 2016-09-25 DIAGNOSIS — I5020 Unspecified systolic (congestive) heart failure: Secondary | ICD-10-CM | POA: Diagnosis not present

## 2016-10-01 ENCOUNTER — Other Ambulatory Visit: Payer: Self-pay | Admitting: Pharmacist

## 2016-10-01 NOTE — Patient Outreach (Signed)
[place] [facility]) Care Management  10/01/2016  Jordan Byrd July 19, 1933 [id number]  Successful phone outreach to patient. Patient reports she completed and mailed back her portion of [place] Patient [facility] application.  Advised patient [facility] Pharmacist received prescriber portion of application back today.   Plan:  Once patient's portion of application is received, will fax completed application to patient assistance program for evaluation.   Karrie Meres, PharmD, [place] [phone]

## 2016-10-16 ENCOUNTER — Other Ambulatory Visit: Payer: Self-pay | Admitting: Pharmacist

## 2016-10-16 NOTE — Patient Outreach (Signed)
[facility]) Care Management  10/16/2016  Jordan Byrd 14-Aug-1933 [id number]  Follow-up call to patient regarding Xarelto patient assistance application.  Patient reports she has not heard anything and agreed to conference call to [facility] Patient [facility].    Per representative with [facility], application was missing household size and clarification of refill from prescriber.  Updated application and placed call to prescriber office to verify refills, spoke with nurse, [name], who reported refills were 5 as indicated on prescription prescriber provided with application.    Re-faxed application to [place].   Plan:  Continue to follow-up with patient regarding status of patient assistance application.   Karrie Meres, PharmD, [place] [phone]

## 2016-10-17 ENCOUNTER — Other Ambulatory Visit: Payer: Self-pay | Admitting: Pharmacist

## 2016-10-17 NOTE — Patient Outreach (Signed)
[place] [facility]) Care Management  10/17/2016  Jordan Byrd [birth year] [id number]  Successful follow-up call to patient.  Conference call completed to [employer] [facility] with patient consent.  Representative reports updated application was received and should be processed in 3-5 business days.    Call disconnected with patient assistance program.   Patient counseled [facility] Pharmacist will follow-up with her next week.   Plan:  Continue to follow-up regarding patient assistance application status.   Karrie Meres, PharmD, [place] [phone]

## 2016-10-22 ENCOUNTER — Other Ambulatory Visit: Payer: Self-pay | Admitting: Pharmacist

## 2016-10-22 NOTE — Patient Outreach (Signed)
[place] [facility]) Care Management  10/22/2016  YI FALLETTA 1933-05-17 [id number]  Successful phone outreach to patient regarding [place] application---patient reports she has not heard any updates.   Call placed to [place]- with patient--representative reports application has not been processed as of time of call---representative reports application should be processed in 24-48 hours.    Call with patient assistance program discontinued.    Patient reports she has about 2 weeks of rivaroxaban left.   Plan:  Will continue to follow-up with patient and patient assistance program regarding status of application.    Karrie Meres, PharmD, [place] [phone]

## 2016-10-24 ENCOUNTER — Other Ambulatory Visit: Payer: Self-pay | Admitting: Pharmacist

## 2016-10-24 NOTE — Patient Outreach (Signed)
[place] [facility]) Care Management  10/24/2016  Jordan Byrd 1933/06/14 [id number]  Successful phone follow-up to patient regarding rivaroxaban patient assistance application.  Conference call placed to [employer]---representative reports application is still pending review and to follow-up next week Tuesday.   Plan:  Will continue to follow-up regarding patient's application for manufacturer assistance.    Karrie Meres, PharmD, [place] [phone]

## 2016-10-26 DIAGNOSIS — J432 Centrilobular emphysema: Secondary | ICD-10-CM | POA: Diagnosis not present

## 2016-10-26 DIAGNOSIS — J45901 Unspecified asthma with (acute) exacerbation: Secondary | ICD-10-CM | POA: Diagnosis not present

## 2016-10-26 DIAGNOSIS — J449 Chronic obstructive pulmonary disease, unspecified: Secondary | ICD-10-CM | POA: Diagnosis not present

## 2016-10-26 DIAGNOSIS — I5020 Unspecified systolic (congestive) heart failure: Secondary | ICD-10-CM | POA: Diagnosis not present

## 2016-10-28 ENCOUNTER — Other Ambulatory Visit: Payer: Self-pay | Admitting: Pharmacist

## 2016-10-28 NOTE — Patient Outreach (Signed)
[place] [facility]) Care Management  10/28/2016  WHITNI PASQUINI 1933/04/29 [id number]  Successful phone outreach to patient, HIPAA details verified.  Call placed to [employer] Patient [place] with patient consent.    Per [place] Patient [facility] representative, patient's application for rivaroxaban was temporarily approved for 30 days, on 10/24/16.  Per representative, a Medicare Part D certification form was mailed to patient's home address and patient needs to complete and return it to patient assistance program in order to keep eligibility active through 02/02/17.    Representative reports patient's pharmacy card information should be active within 24-28 hours so patient can obtain her medication from her pharmacy of choice.    Placed call to patient's pharmacy of choice, [facility], and was told pharmacy card information did not process yet.    Plan:  Will follow-up with patient's pharmacy this week.   Will follow-up with patient in the next 2 weeks to see if she completed her required paperwork for patient assistance program.   Karrie Meres, PharmD, [place] [phone]

## 2016-10-30 ENCOUNTER — Other Ambulatory Visit: Payer: Self-pay | Admitting: Pharmacist

## 2016-10-30 NOTE — Patient Outreach (Signed)
[place] [facility]) Care Management  10/30/2016  YUMALAY CIRCLE [birth year] [id number]  Successful phone outreach to patient.  HIPAA details verified.    Discussed with patient call was placed to her pharmacy of choice, [facility] and per staff at [place], her rivaroxaban (Xarelto) was filled utilizing [place] information.    Reminded patient of need to complete and return her Medicare Part D Certification Form she will receive from [place] Patient [facility].   Plan:   Patient denies further needs.  Will place follow-up call to ensure she received and returned paperwork to patient assistance program.   Karrie Meres, PharmD, [place] [phone]

## 2016-11-03 ENCOUNTER — Encounter: Payer: Self-pay | Admitting: Pharmacist

## 2016-11-03 ENCOUNTER — Other Ambulatory Visit: Payer: Self-pay | Admitting: Pharmacist

## 2016-11-03 NOTE — Patient Outreach (Signed)
[place] [facility]) Care Management  11/03/2016  Jordan Byrd February 09, 1933 [id number]  Incoming call from patient.  Patient reports she received form she needs to send for [employer] Patient Assistance program.  She reports she signed and dated it and is planning to fax it today to patient assistance program.    Patient reports she received her rivaroxaban from her local pharmacy at no charge---reminded her she will need to have local pharmacy bill her [name] Patient Assistance foundation pharmacy information each month.  Reminded patient she should be eligible through 02/02/17 per previous calls to patient assistance program.    Plan:  Will close patient's case at this time.    Will send MD closure letter.   Patient aware she can contact [place] with new pharmacy needs.   Karrie Meres, PharmD, [place] [phone]

## 2016-11-11 ENCOUNTER — Ambulatory Visit: Payer: Self-pay | Admitting: Pharmacist

## 2016-11-11 ENCOUNTER — Other Ambulatory Visit: Payer: Self-pay | Admitting: Pharmacist

## 2016-11-11 DIAGNOSIS — I4892 Unspecified atrial flutter: Secondary | ICD-10-CM | POA: Diagnosis not present

## 2016-11-11 DIAGNOSIS — J449 Chronic obstructive pulmonary disease, unspecified: Secondary | ICD-10-CM | POA: Diagnosis not present

## 2016-11-11 DIAGNOSIS — Z23 Encounter for immunization: Secondary | ICD-10-CM | POA: Diagnosis not present

## 2016-11-11 DIAGNOSIS — I10 Essential (primary) hypertension: Secondary | ICD-10-CM | POA: Diagnosis not present

## 2016-11-11 NOTE — Patient Outreach (Signed)
[place] [facility]) Care Management  11/11/2016  Jordan Byrd 05/28/1933 [id number]  Incoming call received from patient.  Patient asking if [facility] Pharmacist can determine if [place] received her Medicare Certification Form.  She reports she faxed it to patient assistance program but was unable to verify if it was received.    Placed call to [place] with patient, and representative verified with patient  her paperwork was received.  Verified patient is enrolled through end of calendar year for her rivaroxaban. Call disconnected with patient assistance program.   Patient denies other pharmacy related needs at this time.  Reminded patient manufacturer patient assistance programs can be evaluated again in 2019.   Plan:  Will not open pharmacy case.    Karrie Meres, PharmD, [place] [phone]

## 2017-02-10 ENCOUNTER — Other Ambulatory Visit: Payer: Self-pay | Admitting: Internal Medicine

## 2017-02-10 DIAGNOSIS — Z853 Personal history of malignant neoplasm of breast: Secondary | ICD-10-CM

## 2017-02-11 DIAGNOSIS — I10 Essential (primary) hypertension: Secondary | ICD-10-CM | POA: Diagnosis not present

## 2017-02-11 DIAGNOSIS — I4891 Unspecified atrial fibrillation: Secondary | ICD-10-CM | POA: Diagnosis not present

## 2017-02-11 DIAGNOSIS — J449 Chronic obstructive pulmonary disease, unspecified: Secondary | ICD-10-CM | POA: Diagnosis not present

## 2017-03-19 ENCOUNTER — Other Ambulatory Visit: Payer: Self-pay

## 2017-03-19 ENCOUNTER — Emergency Department (HOSPITAL_COMMUNITY)
Admission: EM | Admit: 2017-03-19 | Discharge: 2017-03-19 | Disposition: A | Discharge status: Home / Self Care | Payer: PPO | Attending: Emergency Medicine | Admitting: Emergency Medicine

## 2017-03-19 ENCOUNTER — Encounter (HOSPITAL_COMMUNITY): Payer: Self-pay | Admitting: Emergency Medicine

## 2017-03-19 ENCOUNTER — Emergency Department (HOSPITAL_COMMUNITY): Payer: PPO

## 2017-03-19 DIAGNOSIS — S91311A Laceration without foreign body, right foot, initial encounter: Secondary | ICD-10-CM | POA: Diagnosis not present

## 2017-03-19 DIAGNOSIS — I480 Paroxysmal atrial fibrillation: Secondary | ICD-10-CM | POA: Diagnosis not present

## 2017-03-19 DIAGNOSIS — J449 Chronic obstructive pulmonary disease, unspecified: Secondary | ICD-10-CM | POA: Diagnosis not present

## 2017-03-19 DIAGNOSIS — Z79899 Other long term (current) drug therapy: Secondary | ICD-10-CM | POA: Diagnosis not present

## 2017-03-19 DIAGNOSIS — Z853 Personal history of malignant neoplasm of breast: Secondary | ICD-10-CM | POA: Insufficient documentation

## 2017-03-19 DIAGNOSIS — Y929 Unspecified place or not applicable: Secondary | ICD-10-CM | POA: Diagnosis not present

## 2017-03-19 DIAGNOSIS — S91111A Laceration without foreign body of right great toe without damage to nail, initial encounter: Secondary | ICD-10-CM | POA: Insufficient documentation

## 2017-03-19 DIAGNOSIS — N183 Chronic kidney disease, stage 3 (moderate): Secondary | ICD-10-CM | POA: Insufficient documentation

## 2017-03-19 DIAGNOSIS — Z87891 Personal history of nicotine dependence: Secondary | ICD-10-CM | POA: Diagnosis not present

## 2017-03-19 DIAGNOSIS — I5032 Chronic diastolic (congestive) heart failure: Secondary | ICD-10-CM | POA: Diagnosis not present

## 2017-03-19 DIAGNOSIS — Y9301 Activity, walking, marching and hiking: Secondary | ICD-10-CM | POA: Diagnosis not present

## 2017-03-19 DIAGNOSIS — Y998 Other external cause status: Secondary | ICD-10-CM | POA: Diagnosis not present

## 2017-03-19 DIAGNOSIS — W01198A Fall on same level from slipping, tripping and stumbling with subsequent striking against other object, initial encounter: Secondary | ICD-10-CM | POA: Diagnosis not present

## 2017-03-19 DIAGNOSIS — I130 Hypertensive heart and chronic kidney disease with heart failure and stage 1 through stage 4 chronic kidney disease, or unspecified chronic kidney disease: Secondary | ICD-10-CM | POA: Insufficient documentation

## 2017-03-19 MED ORDER — LIDOCAINE HCL (PF) 1 % IJ SOLN
5.0000 mL | Freq: Once | INTRAMUSCULAR | Status: AC
  Administered 2017-03-19: 5 mL
  Filled 2017-03-19: qty 5

## 2017-03-19 NOTE — ED Notes (Signed)
Patient transported to X-ray 

## 2017-03-19 NOTE — ED Triage Notes (Signed)
Pt presents to ED with laceration to the great toe of the right foot.  Bleeding controlled.  Patient states she tripped and fell over her walker, unsure of what cut her toe.  Tetanus within the last year.  Denies LOC, denies head injury, denies symptoms pror to fall.

## 2017-03-19 NOTE — Discharge Instructions (Signed)
Suture removal in one week 

## 2017-03-19 NOTE — ED Provider Notes (Signed)
[facility] EMERGENCY DEPARTMENT Provider Note   CSN: [id number] Arrival date & time: 03/19/17  1825     History   Chief Complaint Chief Complaint  Patient presents with  . Laceration    HPI Jordan Byrd is a 84 y.o. female.  Patient presents to ED with a laceration to the dorsum of the PIP of the right great toe.  She was walking with her walker and stumbled. Patient is not diabetic. She is on xarelto for atrial fib. She did not hit her head.    The history is provided by the patient. No language interpreter was used.  Laceration   The incident occurred 1 to 2 hours ago. The laceration is located on the right foot. The laceration mechanism is unknown.The pain is at a severity of 0/10. She reports no foreign bodies present. Her tetanus status is UTD.    Past Medical History:  Diagnosis Date  . Anemia   . Ankle swelling   . Anxiety    pt denies this  . Arthritis   . Asthma   . Atrial fibrillation ([place])    a. 04/04/11  . Breast cancer ([place]) 05/19/13   left breast stage IIA   . Bronchitis   . Cataracts, both eyes   . CHF (congestive heart failure) ([place])   . Chronic headaches    no longer having headaches  . Excessive urination at night   . Frequent urination   . GERD (gastroesophageal reflux disease)    [name] Prilosec  . Hypertension   . Kidney infection   . Macular degeneration   . Osteoarthritis    a. 03/28/11 - R Total Hip Arthroplasty  . Pleurisy   . Pneumonia   . Shortness of breath    doe-  . Weight gain     Patient Active Problem List   Diagnosis Date Noted  . Shoulder pain, left   . COPD exacerbation ([place]) 02/09/2015  . Chronic respiratory failure with hypoxia ([place]) 06/15/2014  . Acute respiratory failure with hypoxia ([place]) 05/24/2014  . Influenza A H1N1 infection 05/14/2014  . COPD with exacerbation ([place]) 05/13/2014  . Sleep-disordered breathing 03/11/2014  . Malignant neoplasm of breast (female), unspecified site 07/06/2013    . Family history of malignant neoplasm of breast 07/06/2013  . Breast cancer of upper-outer quadrant of left female breast ([place]) 05/20/2013  . Anemia 03/07/2013  . COPD mixed type ([place]) 10/28/2012  . Chest tightness 10/28/2012  . HCAP (healthcare-associated pneumonia) 09/19/2012  . Chronic anticoagulation 09/19/2012  . Acute on chronic diastolic heart failure ([place]) 07/12/2012  . Dizziness 06/26/2012  . CKD (chronic kidney disease) stage 3, GFR 30-59 ml/min (HCC) 06/26/2012  . Chronic diastolic heart failure ([place]) 06/26/2012  . AKI (acute kidney injury) ([place]) 06/17/2012  . Acute respiratory distress 06/14/2012  . Acute asthma exacerbation 06/14/2012  . UTI (lower urinary tract infection) 04/04/2011  . Arthritis   . Anxiety   . Hypertension   . Paroxysmal atrial fibrillation [facility])     Past Surgical History:  Procedure Laterality Date  . ABDOMINAL HYSTERECTOMY  1962  . ADENOIDECTOMY  1949  . APPENDECTOMY  1954  . BACK SURGERY  1990  . BREAST LUMPECTOMY WITH NEEDLE LOCALIZATION Left 07/14/2013   Procedure: BREAST LUMPECTOMY WITH NEEDLE LOCALIZATION;  Surgeon: Stark Klein, MD;  Location: [facility];  Service: General;  Laterality: Left;  . CARDIAC CATHETERIZATION     1980's or 90's - reportedly normal  . CARDIOVERSION  04/05/2011  Procedure: CARDIOVERSION;  Surgeon: Coralyn Mark, MD;  Location: [facility];  Service: Cardiovascular;  Laterality: N/A;  . CARDIOVERSION N/A 06/19/2012   Procedure: CARDIOVERSION-bedside([id number]);  Surgeon: Lelon Perla, MD;  Location: [facility] OR;  Service: Cardiovascular;  Laterality: N/A;  . CATARACT EXTRACTION BILATERAL W/ ANTERIOR VITRECTOMY  2007/2008  . CHOLECYSTECTOMY    . COLONOSCOPY    . EXCISION / BIOPSY BREAST / NIPPLE / DUCT     left breast  . FOOT SURGERY  1947  . KNEE SURGERY      both knees  . [place] SURGERY  2010  . SHOULDER SURGERY     left  . [place] SURGERY  2008  . TONSILLECTOMY  1943  . TOTAL HIP ARTHROPLASTY  03/28/2011    Procedure: TOTAL HIP ARTHROPLASTY;  Surgeon: Yvette Rack., MD;  Location: [facility];  Service: Orthopedics;  Laterality: Right;  . TOTAL HIP ARTHROPLASTY  03/28/2011   right    OB History    Obstetric Comments   Menses age 31 No pregnancies 3 step-children       Home Medications    Prior to Admission medications   Medication Sig Start Date End Date Taking? Authorizing Provider  acetaminophen (TYLENOL) 500 MG tablet Take 1,000 mg by mouth 2 (two) times daily.    [provider]  albuterol (PROVENTIL HFA;VENTOLIN HFA) 108 (90 BASE) MCG/ACT inhaler Inhale 1 puff into the lungs every 6 (six) hours as needed for wheezing. Reported on 04/10/2015    [provider]  amLODipine (NORVASC) 10 MG tablet Take 10 mg by mouth daily.    [provider]  budesonide-formoterol (SYMBICORT) 160-4.5 MCG/ACT inhaler Inhale 2 puffs into the lungs See admin instructions. Inhale 1 puff every morning, may inhale a 2nd puff in the evening if needed for wheezing    [provider]  flecainide (TAMBOCOR) 50 MG tablet Take 1 tablet (50 mg total) by mouth every 12 (twelve) hours. 04/25/15   Allred, Jeneen Rinks, MD  furosemide (LASIX) 20 MG tablet Take 1 tablet (20 mg total) by mouth daily. 02/15/15   Mikhail, Velta Addison, DO  losartan (COZAAR) 100 MG tablet Take 100 mg by mouth daily.    [provider]  Multiple Vitamin (MULTIVITAMIN WITH MINERALS) TABS tablet Take 1 tablet by mouth daily.    [provider]  Multiple Vitamins-Minerals (ICAPS) CAPS Take 1 capsule by mouth daily.    [provider]  Nebivolol HCl (BYSTOLIC) 20 MG TABS Take 1 tablet (20 mg total) by mouth daily. 10/24/15   Allred, Jeneen Rinks, MD  nitroGLYCERIN (NITROSTAT) 0.4 MG SL tablet Place 0.4 mg under the tongue every 5 (five) minutes as needed for chest pain.    [provider]  omeprazole (PRILOSEC) 20 MG capsule Take 20 mg by mouth daily.    [provider]  oxyCODONE-acetaminophen  (PERCOCET) 5-325 MG tablet Take 1 tablet by mouth every 4 (four) hours as needed. 04/20/16   Nat Christen, MD  potassium chloride (K-DUR) 10 MEQ tablet Take 10 tablets by mouth daily. 04/10/15   [provider]  predniSONE (DELTASONE) 20 MG tablet Take 1 tablet (20 mg total) by mouth daily with breakfast. 1 tablet daily for 7 days, one half tablet daily for 7 days 04/20/16   Nat Christen, MD  rivaroxaban (XARELTO) 20 MG TABS tablet Take 1 tablet (20 mg total) by mouth daily with supper. Patient taking differently: Take 20 mg by mouth daily with breakfast.  10/24/15  Thompson Grayer, MD  traMADol (ULTRAM) 50 MG tablet Take 50 mg by mouth 2 (two) times daily.    [provider]    Family History Family History  Problem Relation Age of Onset  . Diabetes Father        Father, Mother, 4 sisters (2 living)  . Heart attack Father        (Deceased)  . Heart failure Father        (deceased 40)  . Hypertension Father   . Stroke Mother        (deceased 43)  . Hypertension Mother   . Cancer Sister 81       breast cancer  . Heart attack Sister   . Diabetes Sister   . Cancer Other 65       niece with breast cancer    Social History Social History   Tobacco Use  . Smoking status: Former Smoker    Packs/day: 1.00    Years: 21.00    Pack years: 21.00    Types: Cigarettes    Last attempt to quit: 08/15/1970    Years since quitting: 46.6  . Smokeless tobacco: Never Used  Substance Use Topics  . Alcohol use: No    Alcohol/week: 0.0 oz  . Drug use: No     Allergies   Methocarbamol; Vicodin [hydrocodone-acetaminophen]; Aspirin; Butazolidin [phenylbutazone]; Celebrex [celecoxib]; Codeine; Doripenem; Aspirin buf(alhyd-mghyd-cacar); Mucinex [guaifenesin er]; and Temazepam   Review of Systems Review of Systems  Skin: Positive for wound.  All other systems reviewed and are negative.    Physical Exam Updated Vital Signs BP (!) 153/67 (BP Location: Right Arm)   Pulse 70    Temp 98.4 F (36.9 C) (Oral)   Resp 18   SpO2 98%   Physical Exam  Constitutional: She is oriented to person, place, and time. She appears well-developed and well-nourished.  HENT:  Head: Atraumatic.  Eyes: Conjunctivae are normal.  Neck: Neck supple.  Cardiovascular: Normal rate and normal heart sounds.  Pulmonary/Chest: Effort normal and breath sounds normal.  Abdominal: Soft. Bowel sounds are normal.  Musculoskeletal: Normal range of motion. She exhibits no tenderness or deformity.       Feet:  Normal range of motion of great toe. No sensation due to neuropathy. Brisk capillary refill.  Neurological: She is alert and oriented to person, place, and time.  Skin: Skin is warm and dry.  Psychiatric: She has a normal mood and affect.  Nursing note and vitals reviewed.    ED Treatments / Results  Labs (all labs ordered are listed, but only abnormal results are displayed) Labs Reviewed - No data to display  EKG  EKG Interpretation None       Radiology Dg Toe Great Right  Result Date: 03/19/2017 CLINICAL DATA:  Tripped and fell.  Laceration. EXAM: RIGHT GREAT TOE COMPARISON:  Radiograph 07/10/2014 FINDINGS: The joint spaces are maintained. Minimal degenerative changes for age. No radiopaque foreign body. IMPRESSION: No acute bony findings. Electronically Signed   By: Marijo Sanes M.D.   On: 03/19/2017 19:47    Procedures .Marland KitchenLaceration Repair Date/Time: 03/19/2017 8:26 PM Performed by: Etta Quill, NP Authorized by: Etta Quill, NP   Consent:    Consent obtained:  Verbal   Consent given by:  Patient   Risks discussed:  Infection, poor cosmetic result and poor wound healing   Alternatives discussed:  No treatment Anesthesia (see MAR for exact dosages):    Anesthesia method:  Local infiltration   Local  anesthetic:  Lidocaine 1% WITH epi Laceration details:    Location:  Toe   Toe location:  R big toe   Length (cm):  2.5 Repair type:    Repair type:   Simple Pre-procedure details:    Preparation:  Patient was prepped and draped in usual sterile fashion and imaging obtained to evaluate for foreign bodies Exploration:    Hemostasis achieved with:  Direct pressure   Wound exploration: wound explored through full range of motion and entire depth of wound probed and visualized     Contaminated: no   Treatment:    Area cleansed with:  Betadine and saline   Amount of cleaning:  Standard   Irrigation solution:  Sterile saline   Irrigation method:  Syringe Skin repair:    Repair method:  Sutures   Suture size:  4-0   Suture material:  Prolene   Number of sutures:  6 Approximation:    Approximation:  Close Post-procedure details:    Dressing:  Bulky dressing   Patient tolerance of procedure:  Tolerated well, no immediate complications   (including critical care time)  Medications Ordered in ED Medications - No data to display   Initial Impression / Assessment and Plan / ED Course  I have reviewed the triage vital signs and the nursing notes.  Pertinent labs & imaging results that were available during my care of the patient were reviewed by me and considered in my medical decision making (see chart for details).     Tetanus UTD. Laceration occurred < 12 hours prior to repair. Discussed laceration care with pt and answered questions. Pt to f-u for suture removal in 7-10 days and wound check sooner should there be signs of dehiscence or infection. Pt is hemodynamically stable with no complaints prior to dc.    Final Clinical Impressions(s) / ED Diagnoses   Final diagnoses:  Laceration of right great toe without foreign body present or damage to nail, initial encounter    ED Discharge Orders    None       Etta Quill, NP 03/19/17 2031    Dorie Rank, MD 03/20/17 [phone]

## 2017-03-19 NOTE — ED Notes (Signed)
ED Provider at bedside. 

## 2017-03-23 ENCOUNTER — Other Ambulatory Visit: Payer: Self-pay

## 2017-03-25 ENCOUNTER — Ambulatory Visit
Admission: RE | Admit: 2017-03-25 | Discharge: 2017-03-25 | Disposition: A | Discharge status: Home / Self Care | Payer: PPO | Source: Ambulatory Visit | Attending: Internal Medicine | Admitting: Internal Medicine

## 2017-03-25 DIAGNOSIS — Z853 Personal history of malignant neoplasm of breast: Secondary | ICD-10-CM

## 2017-03-25 DIAGNOSIS — R928 Other abnormal and inconclusive findings on diagnostic imaging of breast: Secondary | ICD-10-CM | POA: Diagnosis not present

## 2017-03-27 DIAGNOSIS — I10 Essential (primary) hypertension: Secondary | ICD-10-CM | POA: Diagnosis not present

## 2017-03-27 DIAGNOSIS — S91311A Laceration without foreign body, right foot, initial encounter: Secondary | ICD-10-CM | POA: Diagnosis not present

## 2017-05-04 DIAGNOSIS — H353133 Nonexudative age-related macular degeneration, bilateral, advanced atrophic without subfoveal involvement: Secondary | ICD-10-CM | POA: Diagnosis not present

## 2017-07-01 DIAGNOSIS — K219 Gastro-esophageal reflux disease without esophagitis: Secondary | ICD-10-CM | POA: Diagnosis not present

## 2017-07-01 DIAGNOSIS — I509 Heart failure, unspecified: Secondary | ICD-10-CM | POA: Diagnosis not present

## 2017-07-01 DIAGNOSIS — I10 Essential (primary) hypertension: Secondary | ICD-10-CM | POA: Diagnosis not present

## 2017-07-01 DIAGNOSIS — E785 Hyperlipidemia, unspecified: Secondary | ICD-10-CM | POA: Diagnosis not present

## 2017-07-01 DIAGNOSIS — I4891 Unspecified atrial fibrillation: Secondary | ICD-10-CM | POA: Diagnosis not present

## 2017-07-01 DIAGNOSIS — C50919 Malignant neoplasm of unspecified site of unspecified female breast: Secondary | ICD-10-CM | POA: Diagnosis not present

## 2017-07-01 DIAGNOSIS — D559 Anemia due to enzyme disorder, unspecified: Secondary | ICD-10-CM | POA: Diagnosis not present

## 2017-07-01 DIAGNOSIS — J441 Chronic obstructive pulmonary disease with (acute) exacerbation: Secondary | ICD-10-CM | POA: Diagnosis not present

## 2017-07-07 ENCOUNTER — Encounter (HOSPITAL_COMMUNITY): Payer: Self-pay | Admitting: Emergency Medicine

## 2017-07-07 ENCOUNTER — Emergency Department (HOSPITAL_COMMUNITY)
Admission: EM | Admit: 2017-07-07 | Discharge: 2017-07-07 | Disposition: A | Discharge status: Home / Self Care | Payer: PPO | Attending: Emergency Medicine | Admitting: Emergency Medicine

## 2017-07-07 ENCOUNTER — Emergency Department (HOSPITAL_COMMUNITY): Payer: PPO

## 2017-07-07 ENCOUNTER — Other Ambulatory Visit: Payer: Self-pay

## 2017-07-07 DIAGNOSIS — J45909 Unspecified asthma, uncomplicated: Secondary | ICD-10-CM | POA: Insufficient documentation

## 2017-07-07 DIAGNOSIS — R270 Ataxia, unspecified: Secondary | ICD-10-CM | POA: Diagnosis not present

## 2017-07-07 DIAGNOSIS — R296 Repeated falls: Secondary | ICD-10-CM | POA: Insufficient documentation

## 2017-07-07 DIAGNOSIS — R531 Weakness: Secondary | ICD-10-CM | POA: Insufficient documentation

## 2017-07-07 DIAGNOSIS — M25562 Pain in left knee: Secondary | ICD-10-CM

## 2017-07-07 DIAGNOSIS — D72829 Elevated white blood cell count, unspecified: Secondary | ICD-10-CM | POA: Insufficient documentation

## 2017-07-07 DIAGNOSIS — Z853 Personal history of malignant neoplasm of breast: Secondary | ICD-10-CM | POA: Insufficient documentation

## 2017-07-07 DIAGNOSIS — I130 Hypertensive heart and chronic kidney disease with heart failure and stage 1 through stage 4 chronic kidney disease, or unspecified chronic kidney disease: Secondary | ICD-10-CM | POA: Insufficient documentation

## 2017-07-07 DIAGNOSIS — Z7901 Long term (current) use of anticoagulants: Secondary | ICD-10-CM | POA: Insufficient documentation

## 2017-07-07 DIAGNOSIS — Z87891 Personal history of nicotine dependence: Secondary | ICD-10-CM | POA: Diagnosis not present

## 2017-07-07 DIAGNOSIS — Z79899 Other long term (current) drug therapy: Secondary | ICD-10-CM | POA: Diagnosis not present

## 2017-07-07 DIAGNOSIS — T148XXA Other injury of unspecified body region, initial encounter: Secondary | ICD-10-CM | POA: Diagnosis not present

## 2017-07-07 DIAGNOSIS — I5032 Chronic diastolic (congestive) heart failure: Secondary | ICD-10-CM | POA: Diagnosis not present

## 2017-07-07 DIAGNOSIS — N183 Chronic kidney disease, stage 3 (moderate): Secondary | ICD-10-CM | POA: Diagnosis not present

## 2017-07-07 DIAGNOSIS — M79605 Pain in left leg: Secondary | ICD-10-CM | POA: Diagnosis not present

## 2017-07-07 DIAGNOSIS — S8992XA Unspecified injury of left lower leg, initial encounter: Secondary | ICD-10-CM | POA: Diagnosis not present

## 2017-07-07 LAB — CBC
HCT: 37.5 % (ref 36.0–46.0)
Hemoglobin: 11 g/dL — ABNORMAL LOW (ref 12.0–15.0)
MCH: 26.4 pg (ref 26.0–34.0)
MCHC: 29.3 g/dL — AB (ref 30.0–36.0)
MCV: 90.1 fL (ref 78.0–100.0)
PLATELETS: 368 10*3/uL (ref 150–400)
RBC: 4.16 MIL/uL (ref 3.87–5.11)
RDW: 15.4 % (ref 11.5–15.5)
WBC: 11.1 10*3/uL — ABNORMAL HIGH (ref 4.0–10.5)

## 2017-07-07 LAB — BASIC METABOLIC PANEL
Anion gap: 10 (ref 5–15)
BUN: 32 mg/dL — ABNORMAL HIGH (ref 6–20)
CALCIUM: 9.2 mg/dL (ref 8.9–10.3)
CO2: 26 mmol/L (ref 22–32)
CREATININE: 1.19 mg/dL — AB (ref 0.44–1.00)
Chloride: 104 mmol/L (ref 101–111)
GFR, EST AFRICAN AMERICAN: 48 mL/min — AB (ref >60)
GFR, EST NON AFRICAN AMERICAN: 42 mL/min — AB (ref >60)
GLUCOSE: 117 mg/dL — AB (ref 65–99)
Potassium: 4.5 mmol/L (ref 3.5–5.1)
Sodium: 140 mmol/L (ref 135–145)

## 2017-07-07 LAB — URINALYSIS, ROUTINE W REFLEX MICROSCOPIC
Bilirubin Urine: NEGATIVE
GLUCOSE, UA: NEGATIVE mg/dL
HGB URINE DIPSTICK: NEGATIVE
Ketones, ur: NEGATIVE mg/dL
NITRITE: POSITIVE — AB
Protein, ur: NEGATIVE mg/dL
SPECIFIC GRAVITY, URINE: 1.018 (ref 1.005–1.030)
pH: 5 (ref 5.0–8.0)

## 2017-07-07 NOTE — ED Triage Notes (Signed)
Patient presents to the ED with complaints of multiple falls today ans increase weakness. Patient state denies any LOC or hitting her Head. Reports she is on blood thinners. Patient reports, " I just felt weak". Patient alert and oriented x4. Patient denies any Shortness of breathe,

## 2017-07-07 NOTE — ED Notes (Signed)
Patient transported to MRI 

## 2017-07-07 NOTE — ED Notes (Signed)
Patient transported to X-ray 

## 2017-07-07 NOTE — ED Provider Notes (Addendum)
[facility] EMERGENCY DEPARTMENT Provider Note   CSN: [id number] Arrival date & time: 07/07/17  1124     History   Chief Complaint Chief Complaint  Patient presents with  . Fall  . Weakness    HPI Jordan Byrd is a 84 y.o. female.  84 year old female with past medical history including atrial fibrillation, breast cancer, retention, arthritis, who presents with multiple falls.  Patient and family note that she has had a long-standing history of recurrent problems with falls and weakness when trying to walk due to pain in both legs and knees.  She fell in the middle of the night last night and again this morning. She has had no syncope. Family reports that she stands up and thinks she can get somewhere but her left leg does not seem to move the way she wants it to. She thinks she twisted her L knee and leg during the most recent fall and now hurts more in these areas. She doesn't think she hit her head but isn't sure, no LOC. She takes Xarelto for A fib. No chest pain or SOB. No fevers, vomiting, diarrhea, urinary symptoms, or cough/cold symptoms. She notes she has seen orthopedics in the past and told she has arthritis.   The history is provided by the patient.  Fall   Weakness     Past Medical History:  Diagnosis Date  . Anemia   . Ankle swelling   . Anxiety    pt denies this  . Arthritis   . Asthma   . Atrial fibrillation ([place])    a. 04/04/11  . Breast cancer ([place]) 05/19/13   left breast stage IIA   . Bronchitis   . Cataracts, both eyes   . CHF (congestive heart failure) ([place])   . Chronic headaches    no longer having headaches  . Excessive urination at night   . Frequent urination   . GERD (gastroesophageal reflux disease)    [name] Prilosec  . Hypertension   . Kidney infection   . Macular degeneration   . Osteoarthritis    a. 03/28/11 - R Total Hip Arthroplasty  . Pleurisy   . Pneumonia   . Shortness of breath    doe-  . Weight gain      Patient Active Problem List   Diagnosis Date Noted  . Shoulder pain, left   . COPD exacerbation ([place]) 02/09/2015  . Chronic respiratory failure with hypoxia ([place]) 06/15/2014  . Acute respiratory failure with hypoxia ([place]) 05/24/2014  . Influenza A H1N1 infection 05/14/2014  . COPD with exacerbation ([place]) 05/13/2014  . Sleep-disordered breathing 03/11/2014  . Malignant neoplasm of breast (female), unspecified site 07/06/2013  . Family history of malignant neoplasm of breast 07/06/2013  . Breast cancer of upper-outer quadrant of left female breast ([place]) 05/20/2013  . Anemia 03/07/2013  . COPD mixed type ([place]) 10/28/2012  . Chest tightness 10/28/2012  . HCAP (healthcare-associated pneumonia) 09/19/2012  . Chronic anticoagulation 09/19/2012  . Acute on chronic diastolic heart failure ([place]) 07/12/2012  . Dizziness 06/26/2012  . CKD (chronic kidney disease) stage 3, GFR 30-59 ml/min (H[facility]) 06/26/2012  . Chronic diastolic heart failure ([place]) 06/26/2012  . AKI (acute kidney injury) ([place]) 06/17/2012  . Acute respiratory distress 06/14/2012  . Acute asthma exacerbation 06/14/2012  . UTI (lower urinary tract infection) 04/04/2011  . Arthritis   . Anxiety   . Hypertension   . Paroxysmal atrial fibrillation [facility])     Past Surgical History:  Procedure Laterality Date  . ABDOMINAL HYSTERECTOMY  1962  . ADENOIDECTOMY  1949  . APPENDECTOMY  1954  . BACK SURGERY  1990  . BREAST LUMPECTOMY Left    2015  . BREAST LUMPECTOMY WITH NEEDLE LOCALIZATION Left 07/14/2013   Procedure: BREAST LUMPECTOMY WITH NEEDLE LOCALIZATION;  Surgeon: Stark Klein, MD;  Location: [facility];  Service: General;  Laterality: Left;  . CARDIAC CATHETERIZATION     1980's or 90's - reportedly normal  . CARDIOVERSION  04/05/2011   Procedure: CARDIOVERSION;  Surgeon: Coralyn Mark, MD;  Location: [facility];  Service: Cardiovascular;  Laterality: N/A;  . CARDIOVERSION N/A 06/19/2012   Procedure: CARDIOVERSION-bedside([id number]);   Surgeon: Lelon Perla, MD;  Location: [facility] OR;  Service: Cardiovascular;  Laterality: N/A;  . CATARACT EXTRACTION BILATERAL W/ ANTERIOR VITRECTOMY  2007/2008  . CHOLECYSTECTOMY    . COLONOSCOPY    . EXCISION / BIOPSY BREAST / NIPPLE / DUCT     left breast  . FOOT SURGERY  1947  . KNEE SURGERY      both knees  . [place] SURGERY  2010  . SHOULDER SURGERY     left  . [place] SURGERY  2008  . TONSILLECTOMY  1943  . TOTAL HIP ARTHROPLASTY  03/28/2011   Procedure: TOTAL HIP ARTHROPLASTY;  Surgeon: Yvette Rack., MD;  Location: [facility];  Service: Orthopedics;  Laterality: Right;  . TOTAL HIP ARTHROPLASTY  03/28/2011   right     OB History   None    Obstetric Comments  Menses age 71 No pregnancies 3 step-children         Home Medications    Prior to Admission medications   Medication Sig Start Date End Date Taking? Authorizing Provider  acetaminophen (TYLENOL) 500 MG tablet Take 1,000 mg by mouth 2 (two) times daily.   Yes [provider]  albuterol (PROVENTIL HFA;VENTOLIN HFA) 108 (90 BASE) MCG/ACT inhaler Inhale 1 puff into the lungs every 6 (six) hours as needed for wheezing. Reported on 04/10/2015   Yes [provider]  amLODipine (NORVASC) 10 MG tablet Take 10 mg by mouth daily.   Yes [provider]  budesonide-formoterol (SYMBICORT) 160-4.5 MCG/ACT inhaler Inhale 2 puffs into the lungs See admin instructions. Inhale 1 puff every morning, may inhale a 2nd puff in the evening if needed for wheezing   Yes [provider]  docusate sodium (COLACE) 100 MG capsule Take 100 mg by mouth See admin instructions. Taking one capsule (100 mg) Monday-Thursday only   Yes [provider]  flecainide (TAMBOCOR) 50 MG tablet Take 1 tablet (50 mg total) by mouth every 12 (twelve) hours. 04/25/15  Yes Allred, Jeneen Rinks, MD  furosemide (LASIX) 20 MG tablet Take 1 tablet (20 mg total) by mouth daily. 02/15/15  Yes Mikhail, Velta Addison, DO  losartan  (COZAAR) 100 MG tablet Take 100 mg by mouth daily.   Yes [provider]  Multiple Vitamin (MULTIVITAMIN WITH MINERALS) TABS tablet Take 1 tablet by mouth daily.   Yes [provider]  Multiple Vitamins-Minerals (ICAPS) CAPS Take 1 capsule by mouth daily.   Yes [provider]  Nebivolol HCl (BYSTOLIC) 20 MG TABS Take 1 tablet (20 mg total) by mouth daily. 10/24/15  Yes Allred, Jeneen Rinks, MD  nitroGLYCERIN (NITROSTAT) 0.4 MG SL tablet Place 0.4 mg under the tongue every 5 (five) minutes as needed for chest pain.   Yes [provider]  omeprazole (PRILOSEC) 20 MG capsule Take 20 mg  by mouth daily.   Yes [provider]  potassium chloride (K-DUR) 10 MEQ tablet Take 10 mEq by mouth daily.  04/10/15  Yes [provider]  rivaroxaban (XARELTO) 20 MG TABS tablet Take 1 tablet (20 mg total) by mouth daily with supper. Patient taking differently: Take 20 mg by mouth daily with breakfast.  10/24/15  Yes Allred, Jeneen Rinks, MD  traMADol (ULTRAM) 50 MG tablet Take 50 mg by mouth 2 (two) times daily.   Yes [provider]  oxyCODONE-acetaminophen (PERCOCET) 5-325 MG tablet Take 1 tablet by mouth every 4 (four) hours as needed. Patient not taking: Reported on 07/07/2017 04/20/16   Nat Christen, MD    Family History Family History  Problem Relation Age of Onset  . Diabetes Father        Father, Mother, 4 sisters (2 living)  . Heart attack Father        (Deceased)  . Heart failure Father        (deceased 81)  . Hypertension Father   . Stroke Mother        (deceased 11)  . Hypertension Mother   . Cancer Sister 46       breast cancer  . Breast cancer Sister 74  . Heart attack Sister   . Diabetes Sister   . Cancer Other 33       niece with breast cancer    Social History Social History   Tobacco Use  . Smoking status: Former Smoker    Packs/day: 1.00    Years: 21.00    Pack years: 21.00    Types: Cigarettes    Last attempt to quit: 08/15/1970      Years since quitting: 46.9  . Smokeless tobacco: Never Used  Substance Use Topics  . Alcohol use: No    Alcohol/week: 0.0 oz  . Drug use: No     Allergies   Methocarbamol; Vicodin [hydrocodone-acetaminophen]; Aspirin; Butazolidin [phenylbutazone]; Celebrex [celecoxib]; Codeine; Doripenem; Aspirin buf(alhyd-mghyd-cacar); Mucinex [guaifenesin er]; and Temazepam   Review of Systems Review of Systems  Neurological: Positive for weakness.   All other systems reviewed and are negative except that which was mentioned in HPI   Physical Exam Updated Vital Signs BP (!) 170/60   Pulse 71   Temp 98 F (36.7 C)   Resp 18   Ht 5\' 6"  (1.676 m)   Wt 113.4 kg (250 lb)   SpO2 98%   BMI 40.35 kg/m   Physical Exam  Constitutional: She is oriented to person, place, and time. She appears well-developed and well-nourished. No distress.  Awake, alert  HENT:  Head: Normocephalic and atraumatic.  Eyes: Pupils are equal, round, and reactive to light. Conjunctivae and EOM are normal.  Neck: Neck supple.  Cardiovascular: Normal rate, regular rhythm and normal heart sounds.  No murmur heard. Pulmonary/Chest: Effort normal and breath sounds normal. No respiratory distress.  Abdominal: Soft. Bowel sounds are normal. She exhibits no distension. There is no tenderness.  Musculoskeletal: She exhibits no edema.  Tenderness of lateral L knee without deformity or obvious swelling  Neurological: She is alert and oriented to person, place, and time. She has normal reflexes. No cranial nerve deficit. She exhibits normal muscle tone.  Fluent speech, normal finger-to-nose testing, negative pronator drift, no clonus 5/5 strength and normal sensation x all 4 extremities  Skin: Skin is warm and dry.  Psychiatric: She has a normal mood and affect. Judgment and thought content normal.  Nursing note and vitals reviewed.  ED Treatments / Results  Labs (all labs ordered are listed, but only abnormal  results are displayed) Labs Reviewed  BASIC METABOLIC PANEL - Abnormal; Notable for the following components:      Result Value   Glucose, Bld 117 (*)    BUN 32 (*)    Creatinine, Ser 1.19 (*)    GFR calc non Af Amer 42 (*)    GFR calc Af Amer 48 (*)    All other components within normal limits  CBC - Abnormal; Notable for the following components:   WBC 11.1 (*)    Hemoglobin 11.0 (*)    MCHC 29.3 (*)    All other components within normal limits  URINALYSIS, ROUTINE W REFLEX MICROSCOPIC - Abnormal; Notable for the following components:   APPearance CLOUDY (*)    Nitrite POSITIVE (*)    Leukocytes, UA LARGE (*)    Bacteria, UA RARE (*)    All other components within normal limits  CBG MONITORING, ED    EKG EKG Interpretation  Date/Time:  Tuesday July 07 2017 11:36:11 EDT Ventricular Rate:  69 PR Interval:  210 QRS Duration: 102 QT Interval:  392 QTC Calculation: 420 R Axis:   65 Text Interpretation:  Undetermined rhythm Otherwise normal ECG similar to previous Confirmed by Theotis Burrow [phone]) on 07/07/2017 3:53:28 PM   Radiology Ct Head Wo Contrast  Result Date: 07/07/2017 CLINICAL DATA:  Multiple falls today, increased weakness. EXAM: CT HEAD WITHOUT CONTRAST TECHNIQUE: Contiguous axial images were obtained from the base of the skull through the vertex without intravenous contrast. COMPARISON:  Head CT dated 06/26/2012. FINDINGS: Brain: Mild chronic small vessel ischemic changes within the bilateral periventricular and subcortical white matter. Small old lacunar infarct within the LEFT basal ganglia. Questionable low-density area within the anteromedial aspects of the LEFT cerebellum, edema versus artifact. No parenchymal or extra-axial hemorrhage. Vascular: There are chronic calcified atherosclerotic changes of the large vessels at the skull base. No unexpected hyperdense vessel. Skull: Normal. Negative for fracture or focal lesion. Sinuses/Orbits: No acute finding. Other:  None. IMPRESSION: 1. Vague low-density area within the LEFT cerebellum, difficult to characterize due to beam hardening artifact within the posterior fossa. I suspect that this is also artifactual, but edema related to subacute infarction or neoplastic lesion cannot be confidently excluded. Consider brain MRI for further characterization. 2. No intracranial hemorrhage. 3. No skull fracture. 4. Mild chronic small vessel ischemic changes within the white matter and LEFT basal ganglia. Electronically Signed   By: Franki Cabot M.D.   On: 07/07/2017 17:14   Ct Knee Left Wo Contrast  Result Date: 07/07/2017 CLINICAL DATA:  Acute left knee pain after fall. Possible patellar fracture. EXAM: CT OF THE LEFT KNEE WITHOUT CONTRAST TECHNIQUE: Multidetector CT imaging of the left knee was performed according to the standard protocol. Multiplanar CT image reconstructions were also generated. COMPARISON:  Left knee x-rays from same day. FINDINGS: Bones/Joint/Cartilage No acute fracture or dislocation. Well corticated ossific densities at the superior aspect of the patella are likely degenerative or due to old trauma. Moderate tricompartmental osteoarthritis with joint space narrowing and large marginal osteophytes. Small joint effusion and Baker cyst. There are a few small loose bodies in the posterior joint space. Chondrocalcinosis of the menisci. Ligaments Suboptimally assessed by CT. Muscles and Tendons No muscle atrophy.  The extensor mechanism is intact. Soft tissues Unremarkable.  No soft tissue mass or fluid collection. IMPRESSION: 1. No acute osseous abnormality. Well corticated ossific densities at the superior  aspect of the patella are likely degenerative or due to old trauma. 2. Moderate tricompartmental osteoarthritis with small joint effusion and Baker cyst. 3. Chondrocalcinosis of the menisci which can be seen with CPPD arthropathy. Electronically Signed   By: Titus Dubin M.D.   On: 07/07/2017 19:58   Mr  Brain Wo Contrast (neuro Protocol)  Result Date: 07/07/2017 CLINICAL DATA:  Initial evaluation for acute weakness, ataxia. EXAM: MRI HEAD WITHOUT CONTRAST TECHNIQUE: Multiplanar, multiecho pulse sequences of the brain and surrounding structures were obtained without intravenous contrast. COMPARISON:  Prior CT from earlier the same day. FINDINGS: Brain: Generalized age-related cerebral atrophy. Patchy and confluent T2/FLAIR hyperintensity within the periventricular and deep white matter both cerebral hemispheres, most consistent with chronic small vessel ischemic change, moderate nature. Small remote lacunar infarct present within the left thalamus. No abnormal foci of restricted diffusion to suggest acute or subacute ischemia. Gray-white matter differentiation maintained. No other areas of chronic infarction. No evidence for acute or chronic intracranial hemorrhage. No mass lesion, midline shift or mass effect. No hydrocephalus. 1 cm T2 hyperintense cystic lesion at the inferior left cerebellum, protruding through the left suboccipital calvarium (series 10, image 10), suspected to reflect a small arachnoid cyst, of doubtful significance. No other extra-axial fluid collection. Major dural sinuses grossly patent. Pituitary gland suprasellar region within normal limits. Midline structures intact. Vascular: Major intracranial vascular flow voids maintained at the skull base. Skull and upper cervical spine: Craniocervical junction within normal limits. Moderate degenerative spondylolysis noted at C3-4 and C4-5 with moderate to severe spinal stenosis, incompletely evaluated. Bone marrow signal intensity within normal limits. No scalp soft tissue abnormality. Sinuses/Orbits: Globes and orbital soft tissues within normal limits. Patient status post lens extraction bilaterally. Right maxillary sinus retention cyst. Mild mucosal thickening within the ethmoidal air cells. Small right mastoid effusion, of doubtful  significance. Inner ear structures normal. Other: None. IMPRESSION: 1. No acute intracranial abnormality. 2. Moderate chronic small vessel ischemic disease with small remote left thalamic lacunar infarct. Electronically Signed   By: Jeannine Boga M.D.   On: 07/07/2017 21:25   Dg Knee Complete 4 Views Left  Result Date: 07/07/2017 CLINICAL DATA:  Acute LEFT knee pain following fall. Initial encounter. EXAM: LEFT KNEE - COMPLETE 4+ VIEW COMPARISON:  None. FINDINGS: Irregularity and linear lucency through the UPPER patella is of uncertain chronicity but may represent an acute fracture-correlate clinically. A small knee effusion is present. Moderate to severe tricompartmental degenerative changes are noted, greatest in the MEDIAL compartment. No suspicious focal bony lesions are identified. IMPRESSION: Irregularity and linear lucency through the UPPER patella-uncertain chronicity but could represent an acute fracture. Small knee effusion. Moderate to severe tricompartmental degenerative changes. Electronically Signed   By: Margarette Canada M.D.   On: 07/07/2017 17:04    Procedures Procedures (including critical care time)  Medications Ordered in ED Medications - No data to display   Initial Impression / Assessment and Plan / ED Course  I have reviewed the triage vital signs and the nursing notes.  Pertinent labs & imaging results that were available during my care of the patient were reviewed by me and considered in my medical decision making (see chart for details).    She was well-appearing on exam.  Normal neurologic exam, I do not feel that her symptoms are due to actual leg weakness, rather limitations from joint pain and body habitus.  Obtain head CT due to multiple falls and concern for possible head injury.  CT noted questionable area  in left cerebellum.  Obtained MRI brain which was negative acute.  Plain film of the questionable; obtain CT which was negative for fracture.  She does have  degenerative changes.  Most of her symptoms today sound chronic in nature, have recommended that she follow-up with PCP and I have also placed a face-to-face encounter for home health needs assessment as I feel she would benefit from physical therapy.  I have discussed this with the patient and her family. Return precautions reviewed.  After patient was discharged, her urinalysis resulted showing nitrite positive, large leukocytes suggestive of infection.  Because she has been having worsening falls recently, I called pt and recommended treatment. Called in Rx for keflex, discussed f/u with PCP.  Reviewed return precautions including fever, vomiting, severe pain, or worsening symptoms.  She voiced understanding.  Final Clinical Impressions(s) / ED Diagnoses   Final diagnoses:  Multiple falls  Left knee pain, unspecified chronicity    ED Discharge Orders    None       Elora Wolter, Wenda Overland, MD 07/08/17 0108    Rex Kras Wenda Overland, MD 07/08/17 1440

## 2017-07-07 NOTE — ED Notes (Signed)
Placed purewick on pt. 

## 2017-07-12 LAB — ALKALINE PHOSPHATASE, ISOENZYMES
ALK PHOS LIVER FRACT: 78 % (ref 18–85)
Alk Phos Bone Fract: 22 % (ref 14–68)
Alk Phos: 152 IU/L — ABNORMAL HIGH (ref 39–117)
INTESTINAL %: 0 % (ref 0–18)

## 2017-07-17 ENCOUNTER — Other Ambulatory Visit: Payer: Self-pay | Admitting: *Deleted

## 2017-07-17 NOTE — Patient Outreach (Addendum)
Jordan Byrd [employer] [facility]) Care Management  07/17/2017  Jordan Byrd [birth year] [id number]   Telephone Screen  Referral Date: 07/09/17 Referral Source: [facility] referral  Referral Reason: Cm referral for med, co [place] assistance, Member states she's in the donut hole since May 03, 2022.  Member also is having trouble walking due to repeated falls She expressed HHA because she lives alone and grand daughter helps out as much as she can but she had children to care for  Insurance: HTA, Medicare  Outreach attempt #1, successful Patient is able to verify HIPAA Reviewed and addressed referral to [facility] with patient Jordan Byrd confirms need of assistance with medications, sided effects of medications and home services. She confirms recent falls (fall risk) causing her to "break my false teeth" She reports because of her falls and medication concerns her grand daughter generally assist her with a bath when she can and "I stay in my pajamas" She reports concerns with UTIs. Cm discussed good perineal care with appropriate wiping, hydration and possible cranberry supplements or juice for prevention Offered to send education information but she denied the need.  Social: Jordan Byrd states she lives alone after the death of her husband in 05-03-2010 and has her grand children visit at intervals. She has a niece who buys her groceries. The primary care giver is Jordan Byrd her grand daughter but Jordan Byrd has children and is scheduled to be out of town in July. She needs assist/dependent with ADLs and iADLs. Jordan Byrd uses a rollator and has access to a cane Jordan Byrd had an ED visit on 07/07/17  She states she has been able to afford a Chartered certified accountant once a month. She reports she has not applied for Medicaid because she has land and assets (land) to leave to her family members.      Conditions: Atrial fibrillation, COPD with home oxygen use, HTN, anxiety, UTIs, hx of pain in bilateral knees, anemia, arthritis, breast cancer (left),  cataracts both eyes, CHF, GERD, macular degeneration, chronic headaches, CKD   Medications: she is able to review her 13  medications with CM and reports she is in the donut hole since 05/02/2017.  She reports cost issues with xarelto and side effect issues Tambocor. She is on a fixed income. She reports that she stopped taking oxycodone, and lasix. She states she also stopped Tambocor related to side effects of drowsiness, weakness, inability to walk and falls x 6 in the last 6 weeks. She also has a hx of pain in bilateral knees.  She calls Tambocor, "my sleeping medicine." She reports she is walking better since she stopped Tambocor.  Cm also encouraged Jordan Byrd to check her BP prior to taking her HTN medication to prevent hypotension. She has a BP cuff. She voiced understanding  Appointments: Jordan Byrd last saw Jordan Byrd 07/01/17 and is scheduled for a follow up visit in 6 weeks on 08/13/17, May 02, 2017 She reports not being able to ambulate safely to get to see primary MD but has called the office to speak with his RN about her s/s related to Tambocor and was informed to take half a dose. She reports she is not taking any dose of  Tambocor.  Her grandson went the primary care office when she was not able to get there to update them on the side effects and report that Tambocor was the only new medication started prior to the initiation of the side effects of drowsiness, inability to walk and falls x 6 in the  last 6 weeks    Advance Directives: Pt has HPOA, her grandson Jordan Byrd and a living will   Consent: [facility] RN CM reviewed [facility] services with patient. Patient gave verbal consent for services. She states she prefers female care givers She reports a poor experience with a female care giver in the past. She reports she has used [facility] for home care services in the past. If possible she would prefer to have the same female HHPT from [facility] services offered related  to fall risks and education on major medical conditions but she does not prefer services at this time.  She states she would prefer home health or personal care and pharmacy services first.  Plan: [facility] RN CM will refer Jordan Albano to  [facility] for caregiver respite/home health/personal care services for fall risks, ADLs plus, socialization/support and crisis intervention services and  [facility] for Polypharmacy med review-patient taking greater than 10 meds- 13 medicines per pt Medication assistance. Patient unable to afford meds- in the donut hole since March 2019.  She reports cost issues with xarelto and side effect issues Tambocor (drowsiness, weakness, inability to walk and falls x 6 in the last 6 weeks.)   On behalf of L Tammi Klippel (updated L Manning)  Jordan Byrd. Lavina Hamman, RN, BSN, CCM [facility] Telephonic Care Management Care Coordinator Direct number [phone]  [facility] number [phone] Fax number [phone]

## 2017-07-21 ENCOUNTER — Other Ambulatory Visit: Payer: Self-pay | Admitting: Pharmacy Technician

## 2017-07-21 ENCOUNTER — Other Ambulatory Visit: Payer: Self-pay | Admitting: *Deleted

## 2017-07-21 ENCOUNTER — Telehealth: Payer: Self-pay | Admitting: Pharmacist

## 2017-07-21 NOTE — Patient Outreach (Signed)
[place] [facility]) Care Management  [place]   07/21/2017  Jordan Byrd 27-Sep-1933 [id number]  Subjective: Patient was called regarding medication management and assistance. HIPAA identifiers were obtained. Patient is an 84 year old female with multiple medical conditions including but not limited to:  CHF, anxiety, CKD, COPD, Dizziness, hypertension, and left shoulder pain.  Patient manages her medications on her own. She reported difficulty paying for Bystolic, Xarelto and Symbicort.  She said she already receives Bystolic from a patient assistance program.   Objective:   Encounter Medications: Outpatient Encounter Medications as of 07/21/2017  Medication Sig Note  . acetaminophen (TYLENOL) 500 MG tablet Take 1,000 mg by mouth 2 (two) times daily.   Marland Kitchen albuterol (PROVENTIL HFA;VENTOLIN HFA) 108 (90 BASE) MCG/ACT inhaler Inhale 1 puff into the lungs every 6 (six) hours as needed for wheezing. Reported on 04/10/2015   . amLODipine (NORVASC) 10 MG tablet Take 10 mg by mouth daily.   . budesonide-formoterol (SYMBICORT) 160-4.5 MCG/ACT inhaler Inhale 2 puffs into the lungs See admin instructions. Inhale 1 puff every morning, may inhale a 2nd puff in the evening if needed for wheezing   . docusate sodium (COLACE) 100 MG capsule Take 100 mg by mouth See admin instructions. Taking one capsule (100 mg) Monday-Thursday only   . losartan (COZAAR) 100 MG tablet Take 100 mg by mouth daily.   . Multiple Vitamin (MULTIVITAMIN WITH MINERALS) TABS tablet Take 1 tablet by mouth daily.   . Multiple Vitamins-Minerals (ICAPS) CAPS Take 1 capsule by mouth daily.   . Nebivolol HCl (BYSTOLIC) 20 MG TABS Take 1 tablet (20 mg total) by mouth daily.   . nitroGLYCERIN (NITROSTAT) 0.4 MG SL tablet Place 0.4 mg under the tongue every 5 (five) minutes as needed for chest pain.   Marland Kitchen omeprazole (PRILOSEC) 20 MG capsule Take 20 mg by mouth daily.   . potassium chloride (K-DUR) 10 MEQ tablet Take 10 mEq  by mouth daily.    . rivaroxaban (XARELTO) 20 MG TABS tablet Take 1 tablet (20 mg total) by mouth daily with supper. (Patient taking differently: Take 20 mg by mouth daily with breakfast. )   . traMADol (ULTRAM) 50 MG tablet Take 50 mg by mouth 2 (two) times daily.   . flecainide (TAMBOCOR) 50 MG tablet Take 1 tablet (50 mg total) by mouth every 12 (twelve) hours. (Patient not taking: Reported on 07/17/2017) 07/17/2017: Pt stopped taking related to falls   . furosemide (LASIX) 20 MG tablet Take 1 tablet (20 mg total) by mouth daily. (Patient not taking: Reported on 07/17/2017)   . oxyCODONE-acetaminophen (PERCOCET) 5-325 MG tablet Take 1 tablet by mouth every 4 (four) hours as needed. (Patient not taking: Reported on 07/17/2017)    No facility-administered encounter medications on file as of 07/21/2017.     Functional Status: No flowsheet data found.  Fall/Depression Screening: Fall Risk  03/09/2015 02/27/2015  Falls in the past year? Yes No  Number falls in past yr: 1 -  Injury with Fall? No -  Follow up Falls prevention discussed -   PHQ 2/9 Scores 07/17/2017 02/27/2015 12/25/2014  PHQ - 2 Score 1 0 0      Assessment: Patient's medications were reviewed during our phone call:   Drugs sorted by system:  Neurologic/Psychologic:  Cardiovascular: Amlodipine Furosemide Nevbivolol HCl Xarelto  Pulmonary/Allergy: Albuterol HFA Symbicort  Gastrointestinal: Docusate Omeprazole  Pain: Acetaminophen Oxycodone/Acetaminophen (reported not taking) Tramadol  Vitamins/Minerals: Multiple Vitamin Icaps  Medication Review Findings:  Patient reported discontinuing furosemide and flecainide (Tambocor) due to falls.    Medication Assistance Findings: -patient has [employer] Advantage Plan 2 -From initial financial review, she is over income for the Extra Help or LIS program -patient says she is already receiving Bystolic from a patient assistance program -She may qualify for  Xarelto from [employer] and [place] from [employer].   -[place] requires patient's to spend at least 4% of their income on medication expenses -[place] requires patients to spend at least 3% of their income on medication expenses -[place] has been contacted in reference to the patient's out-of-pocket spending year to date   Plan: -route note to [facility] Certified Pharmacy Technician, Etter Sjogren to fax patient the applications and accompanying letter (patient requested we fax the applications to her home.)  -route note to Dr. Carlota Raspberry to alert him that the applications will be faxed to his office for signature.

## 2017-07-21 NOTE — Patient Outreach (Signed)
[place] [facility]) Care Management  07/21/2017  Jordan Byrd Apr 23, 1933 [id number]  Received [employer] and [name] patient assistance referral from [place] for Symbicort and Xarelto. Per patient request I faxed patient portion of applications to her at [phone]. Faxed provider portions to Dr. [name].  Will follow up with patient in 2-3 business days to verify fax was received.  [name] [place], [place] Management [phone]

## 2017-07-21 NOTE — Patient Outreach (Signed)
  [place] [facility]) Care Management  07/21/2017  EVOLETH NORDMEYER 1933-07-01 [id number]   CSW made initial contact with pt and identity was confirmed. CSW introduced self and role with [facility] as well as reason for call. Pt reports she had 4 falls in 7 days.  "I stopped taking a medicine that makes me sleepy and seems to be better" with no falls since then.  Pt is interested in some in home assistance to help her with bathing.   CSW discussed [facility] with her and will make referral to them. [employer] can assist if pt is approved by providing assistance with light housekeeping, laundry, errands and some personal care.   CSW will also mail pt some community based resources and plan f/u call for further screening and support.  Eduard Clos, MSW, [place] Worker  [place] [phone]

## 2017-07-23 NOTE — Patient Outreach (Signed)
[place] [facility]) Care Management  07/23/2017  APURVA REILY [birth year] [id number]   BSW mailed patient community resources as requested by CSW, Eduard Clos.  Daneen Schick, Arita Miss, CDP Social Worker [phone]

## 2017-07-24 ENCOUNTER — Other Ambulatory Visit: Payer: Self-pay | Admitting: Pharmacy Technician

## 2017-07-24 NOTE — Patient Outreach (Addendum)
[place] [facility]) Care Management  07/24/2017  ILLENE SWEETING 1933-03-15 [id number]   Successful outreach call to Ms. Shakir, HIPAA identifiers confirmed. Informed patient that she has not met the required out of pocket spend to submit applications yet. Patient stated that she spent over $300 dollars last week for her Xarelto however is not on her printout. I will follow up with [employer] to see if I can find anything out.   She also stated that someone from [facility] sent her a paper with some prices on it and she wants to cancel it.... She was not able to provide a name of who she spoke to. Will reach out to others on care team to try to get clarification.  [name] [place], [place] Management [phone]  ADDENDUM:  Received another printout and submitted both completed applications ([place] and Physicist, medical)  Will follow up with companies in 7-10 business days.  [name] [place], [place] Management [phone]

## 2017-07-28 ENCOUNTER — Other Ambulatory Visit: Payer: Self-pay | Admitting: *Deleted

## 2017-07-29 ENCOUNTER — Other Ambulatory Visit: Payer: Self-pay | Admitting: Pharmacy Technician

## 2017-07-29 NOTE — Patient Outreach (Signed)
[place] [facility]) Care Management  07/29/2017  Jordan Byrd December 19, 1933 [id number]   Follow up call to [employer] to check status of patients application for Symbicort. [name] stated that application had been received however the prescription portion would need to be faxed from provider's office.  Contacted [name] at Dr. Hervey Ard office who stated that if I faxed her the form, she would fax it in to the company.  Will follow up with [employer] in the next 2-3 business days.  [name] [place], [place] Management [phone]

## 2017-07-29 NOTE — Patient Outreach (Signed)
[place] [facility]) Care Management  07/29/2017  ORCHID GLASSBERG January 30, 1934 [id number]   CSW contacted pt by phone and she reports she received the resources mailed. She does not feel she needs in home assistance any longer stating she is doing better and able to do her own bathing,etc.   She also denies feeling any concerns or crises type situations with her granddaughter's plans to be gone during the month of July- "my grandson is 2 doors down and he and his wife help me out".    CSW advised pt of plans to close CSW referral at this time.  CSW wished her a happy birthday (2 days until she is 23).   CSW will advise [facility] team of plans to close CSW referral.   Eduard Clos, MSW, [place] Worker  [place] [phone]

## 2017-07-31 ENCOUNTER — Ambulatory Visit: Payer: Self-pay | Admitting: Pharmacist

## 2017-07-31 ENCOUNTER — Other Ambulatory Visit: Payer: Self-pay | Admitting: Pharmacy Technician

## 2017-07-31 NOTE — Patient Outreach (Signed)
[place] [facility]) Care Management  07/31/2017  Jordan Byrd Sep 17, 1933 [id number]   Follow up call to [employer] to check status of patients application for Symbicort. Caren Griffins stated patient has been approved as of 06/28 until 02/02/18 and that patient should receive medication in 7-10 business days.  Will follow up with patient in 10-14 business days to confirm medication has been delivered.  [name] [place], [place] Management [phone]

## 2017-08-04 ENCOUNTER — Other Ambulatory Visit: Payer: Self-pay | Admitting: Pharmacy Technician

## 2017-08-04 NOTE — Patient Outreach (Signed)
[place] [facility]) Care Management  08/04/2017  TANAI BOULER 1933/12/21 [id number]   Follow up call to [employer] to check status of ptients application for Xarelto. Renee stated that page for of application was not legible and neither was the copy of patients insurance card.  Contacted Dr. Hervey Ard office and spoke to [place] who stated that she would see if Dr. [name] can re-sign in the white pat of application and fax it into the company. I reprinted copy of patient insurance card and submitted it to [employer].  Will follow up with [employer] in the next 3-5 business days to check status of application.  [name] [place], [place] Management [phone]

## 2017-08-10 ENCOUNTER — Other Ambulatory Visit: Payer: Self-pay | Admitting: Pharmacy Technician

## 2017-08-10 NOTE — Patient Outreach (Signed)
[place] [facility]) Care Management  08/10/2017  Jordan Byrd 12-22-1933 [id number]   Successful outreach attempt, HIPAA identifiers verified. [name] states that she has not received her Symbicort inhalers in the mail yet but she did receive the letter stating they had been approved. I informed her that she has been approved for her Xarelto as of today and that she can use the same information at the pharmacy that she had been using and that it will take about 2 days for the system to update.  Will follow up with patient in the next 4-5 business days to confirm inhalers have been received.  [name] [place], [place] Management [phone]

## 2017-08-12 ENCOUNTER — Other Ambulatory Visit: Payer: Self-pay | Admitting: Pharmacy Technician

## 2017-08-12 NOTE — Patient Outreach (Signed)
[place] [facility]) Care Management  08/12/2017  Jordan Byrd 1933/02/15 [id number]   Successful call attempt, HIPAA identifiers verified. Jordan Byrd states that she still has not received her Symbicort inhalers from [employer] patient assistance.  Contacted [employer] to check status of delivery, Jordan Byrd stated that prescription portion of application still requires to be sent in with a cover sheet from the providers office.  Contacted Dr. Hervey Ard office to see if application can be refaxed. Office is currently closed.  Will try back by the end of the day or tomorrow.  [name] [place], [place] Management [phone]

## 2017-08-13 DIAGNOSIS — I4891 Unspecified atrial fibrillation: Secondary | ICD-10-CM | POA: Diagnosis not present

## 2017-08-13 DIAGNOSIS — J441 Chronic obstructive pulmonary disease with (acute) exacerbation: Secondary | ICD-10-CM | POA: Diagnosis not present

## 2017-08-14 ENCOUNTER — Telehealth: Payer: Self-pay | Admitting: Pharmacist

## 2017-08-14 NOTE — Patient Outreach (Signed)
[place] [facility]) Care Management  08/14/2017  LYLEE CORROW 05-03-1933 [id number]   Dr. Rolly Salter office was called regarding medication assistance forms for [place] at the request of Etter Sjogren.   According to Doristine Section needed a cover sheet from Dr. Rolly Salter office to accompany the prescription that was previously sent. After several attempts, [name] was not able to rectify the situation.  Abigail Butts at Dr. Rolly Salter office reported she had refaxed the prescription to [employer] with a cover sheet from their office.  [place] was called. The representative said they received the prescription and cover sheet but the refill section on the prescription was not filled out correctly and the date of physician signature was not legible.  Abigail Butts was asked to have the provider change the refill line from "PRN" to "3" and to have Dr. [name] clarify the date of signature.  Abigail Butts said she would handle these items today.   Plan: Route note to Etter Sjogren to follow up.   Elayne Guerin, PharmD, [place] Clinical Pharmacist [phone]

## 2017-08-14 NOTE — Telephone Encounter (Signed)
-----   Message from Adaline Sill, CPhT sent at 08/14/2017  2:02 PM EDT ----- Jordan Byrd is requiring the providers cover sheet to be sent in with the script portion for Symbicort. Dr. Hervey Ard office number is [phone] and the fax number to [place] is [phone]. The patient has been approved but they will not ship drug until the cover sheet has been received with it.

## 2017-08-18 ENCOUNTER — Other Ambulatory Visit: Payer: Self-pay | Admitting: Pharmacist

## 2017-08-18 NOTE — Patient Outreach (Signed)
[place] [facility]) Care Management  08/18/2017  NEHA WAIGHT [birth year] [id number]   [employer] was called on the patient's behalf. Patient is approved with their program to receive Spiriva through 02/02/2018.  However, her shipment is being held because the changes that were made needed to be initialed. Abigail Butts at Dr. Rolly Salter office was called and asked to resend the prescription and cover sheet with the provider's initials.  Abigail Butts said she would resend the prescription and cover sheet today.  Patient was called HIPAA identifiers were obtained. She was informed that she has been approved but we are unsure about a delivery date.  Plan: Follow up on application in 3-5 business days.   Elayne Guerin, PharmD, [place] Clinical Pharmacist [phone]

## 2017-08-24 ENCOUNTER — Other Ambulatory Visit: Payer: Self-pay | Admitting: Pharmacist

## 2017-08-24 NOTE — Patient Outreach (Signed)
[place] [facility]) Care Management  08/24/2017  Jordan Byrd Feb 17, 1933 [id number]   [employer] was called on the patient's behalf. According to the representative, patient has been approved through 02/02/18 and should receive her shipment in 7-10 business days. (She was approved 08/19/17).   Patient was called and made aware of the status of her application.  Plan: [name] will follow up on shipment delivery.   Elayne Guerin, PharmD, [place] Clinical Pharmacist [phone]

## 2017-09-03 ENCOUNTER — Encounter: Payer: Self-pay | Admitting: Pharmacist

## 2017-09-03 ENCOUNTER — Other Ambulatory Visit: Payer: Self-pay | Admitting: Pharmacy Technician

## 2017-09-03 NOTE — Patient Outreach (Signed)
[place] [facility]) Care Management  09/03/2017  Jordan Byrd 01-06-1934 [id number]   Successful outreach attempt, HIPAA identifiers verified. [name] confirmed that she received her Symbicort from [employer] patient assistance. Reviewed with her how to obtain refills from the company and that patient stated she understood. She states that she is not due for her Xarelto until September, I told her per [name] and [name] the card the pharmacy has on file to process it thru should be updated and to call me if she has any issues.  Will route note to [place] for case closure.  [name] [place], [place] Management [phone]

## 2017-09-29 DIAGNOSIS — S91212A Laceration without foreign body of left great toe with damage to nail, initial encounter: Secondary | ICD-10-CM | POA: Diagnosis not present

## 2017-11-03 DIAGNOSIS — H353133 Nonexudative age-related macular degeneration, bilateral, advanced atrophic without subfoveal involvement: Secondary | ICD-10-CM | POA: Diagnosis not present

## 2017-11-03 DIAGNOSIS — H52223 Regular astigmatism, bilateral: Secondary | ICD-10-CM | POA: Diagnosis not present

## 2017-11-25 DIAGNOSIS — I4891 Unspecified atrial fibrillation: Secondary | ICD-10-CM | POA: Diagnosis not present

## 2017-11-25 DIAGNOSIS — I10 Essential (primary) hypertension: Secondary | ICD-10-CM | POA: Diagnosis not present

## 2017-11-25 DIAGNOSIS — Z23 Encounter for immunization: Secondary | ICD-10-CM | POA: Diagnosis not present

## 2017-11-25 DIAGNOSIS — M1990 Unspecified osteoarthritis, unspecified site: Secondary | ICD-10-CM | POA: Diagnosis not present

## 2018-01-12 DIAGNOSIS — M25572 Pain in left ankle and joints of left foot: Secondary | ICD-10-CM | POA: Diagnosis not present

## 2018-01-12 DIAGNOSIS — M25562 Pain in left knee: Secondary | ICD-10-CM | POA: Diagnosis not present

## 2018-01-16 DIAGNOSIS — M1712 Unilateral primary osteoarthritis, left knee: Secondary | ICD-10-CM | POA: Diagnosis not present

## 2018-01-16 DIAGNOSIS — Z9181 History of falling: Secondary | ICD-10-CM | POA: Diagnosis not present

## 2018-01-16 DIAGNOSIS — J449 Chronic obstructive pulmonary disease, unspecified: Secondary | ICD-10-CM | POA: Diagnosis not present

## 2018-01-16 DIAGNOSIS — Z7901 Long term (current) use of anticoagulants: Secondary | ICD-10-CM | POA: Diagnosis not present

## 2018-01-16 DIAGNOSIS — M19072 Primary osteoarthritis, left ankle and foot: Secondary | ICD-10-CM | POA: Diagnosis not present

## 2018-01-16 DIAGNOSIS — Z9981 Dependence on supplemental oxygen: Secondary | ICD-10-CM | POA: Diagnosis not present

## 2018-01-26 DIAGNOSIS — Z7901 Long term (current) use of anticoagulants: Secondary | ICD-10-CM | POA: Diagnosis not present

## 2018-01-26 DIAGNOSIS — M1712 Unilateral primary osteoarthritis, left knee: Secondary | ICD-10-CM | POA: Diagnosis not present

## 2018-01-26 DIAGNOSIS — J449 Chronic obstructive pulmonary disease, unspecified: Secondary | ICD-10-CM | POA: Diagnosis not present

## 2018-01-26 DIAGNOSIS — Z9981 Dependence on supplemental oxygen: Secondary | ICD-10-CM | POA: Diagnosis not present

## 2018-01-26 DIAGNOSIS — Z9181 History of falling: Secondary | ICD-10-CM | POA: Diagnosis not present

## 2018-01-26 DIAGNOSIS — M19072 Primary osteoarthritis, left ankle and foot: Secondary | ICD-10-CM | POA: Diagnosis not present

## 2018-02-01 ENCOUNTER — Emergency Department (HOSPITAL_COMMUNITY): Payer: PPO

## 2018-02-01 ENCOUNTER — Other Ambulatory Visit: Payer: Self-pay

## 2018-02-01 ENCOUNTER — Encounter (HOSPITAL_COMMUNITY): Payer: Self-pay | Admitting: *Deleted

## 2018-02-01 ENCOUNTER — Inpatient Hospital Stay (HOSPITAL_COMMUNITY)
Admission: EM | Admit: 2018-02-01 | Discharge: 2018-02-12 | DRG: 444 | Disposition: A | Discharge status: Skilled Nursing Facility | Payer: PPO | Attending: Internal Medicine | Admitting: Internal Medicine

## 2018-02-01 DIAGNOSIS — N183 Chronic kidney disease, stage 3 unspecified: Secondary | ICD-10-CM | POA: Diagnosis present

## 2018-02-01 DIAGNOSIS — Z8249 Family history of ischemic heart disease and other diseases of the circulatory system: Secondary | ICD-10-CM | POA: Diagnosis not present

## 2018-02-01 DIAGNOSIS — Z9981 Dependence on supplemental oxygen: Secondary | ICD-10-CM

## 2018-02-01 DIAGNOSIS — R7989 Other specified abnormal findings of blood chemistry: Secondary | ICD-10-CM | POA: Diagnosis not present

## 2018-02-01 DIAGNOSIS — R58 Hemorrhage, not elsewhere classified: Secondary | ICD-10-CM | POA: Diagnosis not present

## 2018-02-01 DIAGNOSIS — M25511 Pain in right shoulder: Secondary | ICD-10-CM | POA: Diagnosis not present

## 2018-02-01 DIAGNOSIS — Z853 Personal history of malignant neoplasm of breast: Secondary | ICD-10-CM | POA: Diagnosis not present

## 2018-02-01 DIAGNOSIS — Z1611 Resistance to penicillins: Secondary | ICD-10-CM | POA: Diagnosis not present

## 2018-02-01 DIAGNOSIS — Z90710 Acquired absence of both cervix and uterus: Secondary | ICD-10-CM

## 2018-02-01 DIAGNOSIS — R0602 Shortness of breath: Secondary | ICD-10-CM | POA: Diagnosis not present

## 2018-02-01 DIAGNOSIS — D649 Anemia, unspecified: Secondary | ICD-10-CM | POA: Diagnosis not present

## 2018-02-01 DIAGNOSIS — R17 Unspecified jaundice: Secondary | ICD-10-CM

## 2018-02-01 DIAGNOSIS — S63280A Dislocation of proximal interphalangeal joint of right index finger, initial encounter: Secondary | ICD-10-CM | POA: Diagnosis not present

## 2018-02-01 DIAGNOSIS — D72829 Elevated white blood cell count, unspecified: Secondary | ICD-10-CM | POA: Diagnosis not present

## 2018-02-01 DIAGNOSIS — Z7951 Long term (current) use of inhaled steroids: Secondary | ICD-10-CM

## 2018-02-01 DIAGNOSIS — S79911A Unspecified injury of right hip, initial encounter: Secondary | ICD-10-CM | POA: Diagnosis not present

## 2018-02-01 DIAGNOSIS — J449 Chronic obstructive pulmonary disease, unspecified: Secondary | ICD-10-CM | POA: Diagnosis not present

## 2018-02-01 DIAGNOSIS — W19XXXA Unspecified fall, initial encounter: Secondary | ICD-10-CM

## 2018-02-01 DIAGNOSIS — I130 Hypertensive heart and chronic kidney disease with heart failure and stage 1 through stage 4 chronic kidney disease, or unspecified chronic kidney disease: Secondary | ICD-10-CM | POA: Diagnosis not present

## 2018-02-01 DIAGNOSIS — Z66 Do not resuscitate: Secondary | ICD-10-CM | POA: Diagnosis not present

## 2018-02-01 DIAGNOSIS — I81 Portal vein thrombosis: Secondary | ICD-10-CM | POA: Diagnosis present

## 2018-02-01 DIAGNOSIS — S0291XA Unspecified fracture of skull, initial encounter for closed fracture: Secondary | ICD-10-CM | POA: Diagnosis not present

## 2018-02-01 DIAGNOSIS — Z833 Family history of diabetes mellitus: Secondary | ICD-10-CM | POA: Diagnosis not present

## 2018-02-01 DIAGNOSIS — S63250A Unspecified dislocation of right index finger, initial encounter: Secondary | ICD-10-CM | POA: Diagnosis present

## 2018-02-01 DIAGNOSIS — Z981 Arthrodesis status: Secondary | ICD-10-CM | POA: Diagnosis not present

## 2018-02-01 DIAGNOSIS — R0902 Hypoxemia: Secondary | ICD-10-CM | POA: Diagnosis not present

## 2018-02-01 DIAGNOSIS — N179 Acute kidney failure, unspecified: Secondary | ICD-10-CM | POA: Diagnosis present

## 2018-02-01 DIAGNOSIS — Z79899 Other long term (current) drug therapy: Secondary | ICD-10-CM | POA: Diagnosis not present

## 2018-02-01 DIAGNOSIS — K5900 Constipation, unspecified: Secondary | ICD-10-CM | POA: Diagnosis not present

## 2018-02-01 DIAGNOSIS — R140 Abdominal distension (gaseous): Secondary | ICD-10-CM | POA: Diagnosis not present

## 2018-02-01 DIAGNOSIS — Y92012 Bathroom of single-family (private) house as the place of occurrence of the external cause: Secondary | ICD-10-CM

## 2018-02-01 DIAGNOSIS — I480 Paroxysmal atrial fibrillation: Secondary | ICD-10-CM | POA: Diagnosis present

## 2018-02-01 DIAGNOSIS — Z803 Family history of malignant neoplasm of breast: Secondary | ICD-10-CM | POA: Diagnosis not present

## 2018-02-01 DIAGNOSIS — I4891 Unspecified atrial fibrillation: Secondary | ICD-10-CM | POA: Diagnosis not present

## 2018-02-01 DIAGNOSIS — R110 Nausea: Secondary | ICD-10-CM | POA: Diagnosis present

## 2018-02-01 DIAGNOSIS — K219 Gastro-esophageal reflux disease without esophagitis: Secondary | ICD-10-CM | POA: Diagnosis present

## 2018-02-01 DIAGNOSIS — R296 Repeated falls: Secondary | ICD-10-CM

## 2018-02-01 DIAGNOSIS — C7800 Secondary malignant neoplasm of unspecified lung: Secondary | ICD-10-CM | POA: Diagnosis present

## 2018-02-01 DIAGNOSIS — S79912A Unspecified injury of left hip, initial encounter: Secondary | ICD-10-CM | POA: Diagnosis not present

## 2018-02-01 DIAGNOSIS — Y9301 Activity, walking, marching and hiking: Secondary | ICD-10-CM | POA: Diagnosis present

## 2018-02-01 DIAGNOSIS — D509 Iron deficiency anemia, unspecified: Secondary | ICD-10-CM | POA: Diagnosis present

## 2018-02-01 DIAGNOSIS — R633 Feeding difficulties: Secondary | ICD-10-CM | POA: Diagnosis present

## 2018-02-01 DIAGNOSIS — C50412 Malignant neoplasm of upper-outer quadrant of left female breast: Secondary | ICD-10-CM | POA: Diagnosis not present

## 2018-02-01 DIAGNOSIS — R0600 Dyspnea, unspecified: Secondary | ICD-10-CM | POA: Diagnosis not present

## 2018-02-01 DIAGNOSIS — Z7901 Long term (current) use of anticoagulants: Secondary | ICD-10-CM | POA: Diagnosis not present

## 2018-02-01 DIAGNOSIS — I5033 Acute on chronic diastolic (congestive) heart failure: Secondary | ICD-10-CM | POA: Diagnosis not present

## 2018-02-01 DIAGNOSIS — I509 Heart failure, unspecified: Secondary | ICD-10-CM | POA: Diagnosis not present

## 2018-02-01 DIAGNOSIS — R945 Abnormal results of liver function studies: Secondary | ICD-10-CM

## 2018-02-01 DIAGNOSIS — R0989 Other specified symptoms and signs involving the circulatory and respiratory systems: Secondary | ICD-10-CM | POA: Diagnosis not present

## 2018-02-01 DIAGNOSIS — R160 Hepatomegaly, not elsewhere classified: Secondary | ICD-10-CM | POA: Diagnosis not present

## 2018-02-01 DIAGNOSIS — I110 Hypertensive heart disease with heart failure: Secondary | ICD-10-CM | POA: Diagnosis not present

## 2018-02-01 DIAGNOSIS — R112 Nausea with vomiting, unspecified: Secondary | ICD-10-CM | POA: Diagnosis not present

## 2018-02-01 DIAGNOSIS — S0990XA Unspecified injury of head, initial encounter: Secondary | ICD-10-CM | POA: Diagnosis not present

## 2018-02-01 DIAGNOSIS — Z23 Encounter for immunization: Secondary | ICD-10-CM | POA: Diagnosis not present

## 2018-02-01 DIAGNOSIS — R279 Unspecified lack of coordination: Secondary | ICD-10-CM | POA: Diagnosis not present

## 2018-02-01 DIAGNOSIS — Z743 Need for continuous supervision: Secondary | ICD-10-CM | POA: Diagnosis not present

## 2018-02-01 DIAGNOSIS — Z96641 Presence of right artificial hip joint: Secondary | ICD-10-CM | POA: Diagnosis present

## 2018-02-01 DIAGNOSIS — T85520A Displacement of bile duct prosthesis, initial encounter: Secondary | ICD-10-CM | POA: Diagnosis not present

## 2018-02-01 DIAGNOSIS — C221 Intrahepatic bile duct carcinoma: Secondary | ICD-10-CM | POA: Diagnosis present

## 2018-02-01 DIAGNOSIS — Z6841 Body Mass Index (BMI) 40.0 and over, adult: Secondary | ICD-10-CM | POA: Diagnosis not present

## 2018-02-01 DIAGNOSIS — I10 Essential (primary) hypertension: Secondary | ICD-10-CM | POA: Diagnosis present

## 2018-02-01 DIAGNOSIS — T380X5A Adverse effect of glucocorticoids and synthetic analogues, initial encounter: Secondary | ICD-10-CM | POA: Diagnosis not present

## 2018-02-01 DIAGNOSIS — Z87891 Personal history of nicotine dependence: Secondary | ICD-10-CM | POA: Diagnosis not present

## 2018-02-01 DIAGNOSIS — Z9049 Acquired absence of other specified parts of digestive tract: Secondary | ICD-10-CM | POA: Diagnosis not present

## 2018-02-01 DIAGNOSIS — K831 Obstruction of bile duct: Secondary | ICD-10-CM | POA: Diagnosis not present

## 2018-02-01 DIAGNOSIS — S63290A Dislocation of distal interphalangeal joint of right index finger, initial encounter: Secondary | ICD-10-CM | POA: Diagnosis not present

## 2018-02-01 DIAGNOSIS — I5032 Chronic diastolic (congestive) heart failure: Secondary | ICD-10-CM | POA: Diagnosis not present

## 2018-02-01 DIAGNOSIS — D72828 Other elevated white blood cell count: Secondary | ICD-10-CM | POA: Diagnosis present

## 2018-02-01 DIAGNOSIS — S63259A Unspecified dislocation of unspecified finger, initial encounter: Secondary | ICD-10-CM

## 2018-02-01 DIAGNOSIS — K7689 Other specified diseases of liver: Secondary | ICD-10-CM | POA: Diagnosis not present

## 2018-02-01 DIAGNOSIS — K8689 Other specified diseases of pancreas: Secondary | ICD-10-CM | POA: Diagnosis not present

## 2018-02-01 DIAGNOSIS — W010XXA Fall on same level from slipping, tripping and stumbling without subsequent striking against object, initial encounter: Secondary | ICD-10-CM | POA: Diagnosis present

## 2018-02-01 DIAGNOSIS — I361 Nonrheumatic tricuspid (valve) insufficiency: Secondary | ICD-10-CM | POA: Diagnosis not present

## 2018-02-01 DIAGNOSIS — R52 Pain, unspecified: Secondary | ICD-10-CM | POA: Diagnosis not present

## 2018-02-01 DIAGNOSIS — S199XXA Unspecified injury of neck, initial encounter: Secondary | ICD-10-CM | POA: Diagnosis not present

## 2018-02-01 DIAGNOSIS — D376 Neoplasm of uncertain behavior of liver, gallbladder and bile ducts: Secondary | ICD-10-CM | POA: Diagnosis not present

## 2018-02-01 DIAGNOSIS — R2689 Other abnormalities of gait and mobility: Secondary | ICD-10-CM | POA: Diagnosis not present

## 2018-02-01 DIAGNOSIS — R278 Other lack of coordination: Secondary | ICD-10-CM | POA: Diagnosis not present

## 2018-02-01 HISTORY — DX: Chronic obstructive pulmonary disease, unspecified: J44.9

## 2018-02-01 HISTORY — DX: Malignant neoplasm of unspecified site of left female breast: C50.912

## 2018-02-01 HISTORY — DX: Dependence on supplemental oxygen: Z99.81

## 2018-02-01 HISTORY — DX: Obstruction of bile duct: K83.1

## 2018-02-01 LAB — URINALYSIS, ROUTINE W REFLEX MICROSCOPIC
Glucose, UA: NEGATIVE mg/dL
Hgb urine dipstick: NEGATIVE
Ketones, ur: NEGATIVE mg/dL
NITRITE: NEGATIVE
Protein, ur: NEGATIVE mg/dL
Specific Gravity, Urine: 1.016 (ref 1.005–1.030)
pH: 5 (ref 5.0–8.0)

## 2018-02-01 LAB — CBC WITH DIFFERENTIAL/PLATELET
BASOS ABS: 0 10*3/uL (ref 0.0–0.1)
BASOS PCT: 0 %
Eosinophils Absolute: 0.4 10*3/uL (ref 0.0–0.5)
Eosinophils Relative: 4 %
HCT: 33.6 % — ABNORMAL LOW (ref 36.0–46.0)
Hemoglobin: 11.4 g/dL — ABNORMAL LOW (ref 12.0–15.0)
Lymphocytes Relative: 3 %
Lymphs Abs: 0.3 10*3/uL — ABNORMAL LOW (ref 0.7–4.0)
MCH: 29 pg (ref 26.0–34.0)
MCHC: 33.9 g/dL (ref 30.0–36.0)
MCV: 85.5 fL (ref 80.0–100.0)
Monocytes Absolute: 0.8 10*3/uL (ref 0.1–1.0)
Monocytes Relative: 7 %
Neutro Abs: 9.3 10*3/uL — ABNORMAL HIGH (ref 1.7–7.7)
Neutrophils Relative %: 85 %
Platelets: 288 10*3/uL (ref 150–400)
RBC: 3.93 MIL/uL (ref 3.87–5.11)
RDW: 21.9 % — ABNORMAL HIGH (ref 11.5–15.5)
WBC: 10.8 10*3/uL — ABNORMAL HIGH (ref 4.0–10.5)
nRBC: 0 % (ref 0.0–0.2)
nRBC: 0 /100 WBC

## 2018-02-01 LAB — COMPREHENSIVE METABOLIC PANEL
ALT: 243 U/L — ABNORMAL HIGH (ref 0–44)
AST: 212 U/L — ABNORMAL HIGH (ref 15–41)
Albumin: 2.4 g/dL — ABNORMAL LOW (ref 3.5–5.0)
Alkaline Phosphatase: 905 U/L — ABNORMAL HIGH (ref 38–126)
Anion gap: 12 (ref 5–15)
BUN: 21 mg/dL (ref 8–23)
CHLORIDE: 104 mmol/L (ref 98–111)
CO2: 21 mmol/L — ABNORMAL LOW (ref 22–32)
Calcium: 8.8 mg/dL — ABNORMAL LOW (ref 8.9–10.3)
Creatinine, Ser: 1.09 mg/dL — ABNORMAL HIGH (ref 0.44–1.00)
GFR calc Af Amer: 55 mL/min — ABNORMAL LOW (ref >60)
GFR calc non Af Amer: 47 mL/min — ABNORMAL LOW (ref >60)
Glucose, Bld: 104 mg/dL — ABNORMAL HIGH (ref 70–99)
Potassium: 4.3 mmol/L (ref 3.5–5.1)
SODIUM: 137 mmol/L (ref 135–145)
Total Bilirubin: 13.6 mg/dL — ABNORMAL HIGH (ref 0.3–1.2)
Total Protein: 5.8 g/dL — ABNORMAL LOW (ref 6.5–8.1)

## 2018-02-01 LAB — LIPASE, BLOOD: Lipase: 19 U/L (ref 11–51)

## 2018-02-01 LAB — PROTIME-INR
INR: 1.16
PROTHROMBIN TIME: 14.7 s (ref 11.4–15.2)

## 2018-02-01 MED ORDER — LOSARTAN POTASSIUM 50 MG PO TABS
100.0000 mg | ORAL_TABLET | Freq: Every day | ORAL | Status: DC
  Administered 2018-02-01 – 2018-02-03 (×3): 100 mg via ORAL
  Filled 2018-02-01 (×3): qty 2

## 2018-02-01 MED ORDER — TETANUS-DIPHTH-ACELL PERTUSSIS 5-2.5-18.5 LF-MCG/0.5 IM SUSP
0.5000 mL | Freq: Once | INTRAMUSCULAR | Status: AC
  Administered 2018-02-01: 0.5 mL via INTRAMUSCULAR
  Filled 2018-02-01: qty 0.5

## 2018-02-01 MED ORDER — LIDOCAINE HCL (PF) 1 % IJ SOLN
5.0000 mL | Freq: Once | INTRAMUSCULAR | Status: AC
  Administered 2018-02-01: 5 mL
  Filled 2018-02-01: qty 5

## 2018-02-01 MED ORDER — ACETAMINOPHEN 650 MG RE SUPP: 650.0000 mg | Freq: Four times a day (QID) | RECTAL | Status: DC | PRN

## 2018-02-01 MED ORDER — ALBUTEROL SULFATE (2.5 MG/3ML) 0.083% IN NEBU
3.0000 mL | INHALATION_SOLUTION | Freq: Four times a day (QID) | RESPIRATORY_TRACT | Status: DC | PRN
  Administered 2018-02-02 – 2018-02-07 (×4): 3 mL via RESPIRATORY_TRACT
  Filled 2018-02-01 (×4): qty 3

## 2018-02-01 MED ORDER — ONDANSETRON HCL 4 MG PO TABS
4.0000 mg | ORAL_TABLET | Freq: Four times a day (QID) | ORAL | Status: DC | PRN
  Administered 2018-02-02 – 2018-02-12 (×2): 4 mg via ORAL
  Filled 2018-02-01 (×2): qty 1

## 2018-02-01 MED ORDER — ACETAMINOPHEN 325 MG PO TABS
650.0000 mg | ORAL_TABLET | Freq: Four times a day (QID) | ORAL | Status: DC | PRN
  Administered 2018-02-07 – 2018-02-09 (×2): 650 mg via ORAL
  Filled 2018-02-01 (×2): qty 2

## 2018-02-01 MED ORDER — TRAMADOL HCL 50 MG PO TABS
50.0000 mg | ORAL_TABLET | Freq: Two times a day (BID) | ORAL | Status: DC
  Administered 2018-02-01 – 2018-02-12 (×22): 50 mg via ORAL
  Filled 2018-02-01 (×22): qty 1

## 2018-02-01 MED ORDER — NEBIVOLOL HCL 10 MG PO TABS
20.0000 mg | ORAL_TABLET | Freq: Every day | ORAL | Status: DC
  Administered 2018-02-02 – 2018-02-12 (×11): 20 mg via ORAL
  Filled 2018-02-01 (×12): qty 2

## 2018-02-01 MED ORDER — IOHEXOL 300 MG/ML  SOLN
100.0000 mL | Freq: Once | INTRAMUSCULAR | Status: AC | PRN
  Administered 2018-02-01: 100 mL via INTRAVENOUS

## 2018-02-01 MED ORDER — MORPHINE SULFATE (PF) 2 MG/ML IV SOLN
2.0000 mg | INTRAVENOUS | Status: DC | PRN
  Administered 2018-02-03 – 2018-02-07 (×3): 2 mg via INTRAVENOUS
  Filled 2018-02-01 (×3): qty 1

## 2018-02-01 MED ORDER — MOMETASONE FURO-FORMOTEROL FUM 200-5 MCG/ACT IN AERO
2.0000 | INHALATION_SPRAY | Freq: Two times a day (BID) | RESPIRATORY_TRACT | Status: DC
  Administered 2018-02-01 – 2018-02-12 (×21): 2 via RESPIRATORY_TRACT
  Filled 2018-02-01: qty 8.8

## 2018-02-01 MED ORDER — ONDANSETRON HCL 4 MG/2ML IJ SOLN
4.0000 mg | Freq: Four times a day (QID) | INTRAMUSCULAR | Status: DC | PRN
  Administered 2018-02-02 – 2018-02-12 (×8): 4 mg via INTRAVENOUS
  Filled 2018-02-01 (×8): qty 2

## 2018-02-01 MED ORDER — AMLODIPINE BESYLATE 5 MG PO TABS
10.0000 mg | ORAL_TABLET | Freq: Every day | ORAL | Status: DC
  Administered 2018-02-02 – 2018-02-12 (×11): 10 mg via ORAL
  Filled 2018-02-01 (×12): qty 2

## 2018-02-01 MED ORDER — SODIUM CHLORIDE 0.9 % IV BOLUS
1000.0000 mL | Freq: Once | INTRAVENOUS | Status: AC
  Administered 2018-02-01: 1000 mL via INTRAVENOUS

## 2018-02-01 NOTE — ED Provider Notes (Signed)
Patient seen/examined in the Emergency Department in conjunction with Midlevel Provider  Patient reports fall while going to bathroom.  She has right index finger injury Exam : Awake alert, no acute distress.  Obvious deformity right index finger. Patient is clearly jaundiced with scleral icterus Plan: Extensive labs and imaging have been ordered.  PA Athena Masse will reduce finger dislocation    Ripley Fraise, MD 02/01/18 [phone]

## 2018-02-01 NOTE — ED Notes (Signed)
Patient transported to CT 

## 2018-02-01 NOTE — ED Provider Notes (Signed)
[facility] EMERGENCY DEPARTMENT Provider Note   CSN: [id number] Arrival date & time: 02/01/18  0534     History   Chief Complaint Chief Complaint  Patient presents with  . Fall    HPI Jordan Byrd is a 84 y.o. female presents today via EMS for fall.  Patient states that she has been losing her balance very often over the past year with multiple falls.  Patient states that she was walking to the bathroom today when she lost her balance falling striking her right hand on the bathtub and forehead on the toilet.  Patient comes in with obvious deformity to the right index finger, on her deviation/dislocation.   Patient did denies lightheadedness or dizziness prior to fall, she denies chest pain or shortness of breath.  Patient denies loss of consciousness or vision changes however she does take Xarelto.  Patient with small erythema to right forehead, denies associated headache or scalp pain.  Patient denies neck pain, chest pain or shortness of breath, she denies back pain.  At time of evaluation patient primarily endorses right index finger pain. Patient describes right index pain as a moderate, throb, constant and worsened with palpation.  Additionally patient reports some pain to the left hip, mild, throbbing, constant and worsened with movement of the left hip.  No shortening or rotation is noted.  Additionally patient reports dark brown/black urine over the past few days with decreased urinary frequency.  She endorses some dysuria.  Finally patient is noted to be jaundiced today with scleral icterus present.  Patient states that she has never noticed jaundice before and this is new to her. HPI  Past Medical History:  Diagnosis Date  . Anemia   . Arthritis   . Asthma   . Atrial fibrillation ([place])    on Xarelto  . Breast cancer ([place]) 05/19/13   left breast stage IIA   . Cataracts, both eyes   . CHF (congestive heart failure) ([place])   . GERD (gastroesophageal  reflux disease)    [name] Prilosec  . Hypertension   . Macular degeneration   . Osteoarthritis    a. 03/28/11 - R Total Hip Arthroplasty  . Pleurisy   . Pneumonia     Patient Active Problem List   Diagnosis Date Noted  . Obstructive jaundice 02/01/2018  . Shoulder pain, left   . COPD exacerbation ([place]) 02/09/2015  . Chronic respiratory failure with hypoxia ([place]) 06/15/2014  . Acute respiratory failure with hypoxia ([place]) 05/24/2014  . Influenza A H1N1 infection 05/14/2014  . COPD with exacerbation ([place]) 05/13/2014  . Sleep-disordered breathing 03/11/2014  . Malignant neoplasm of breast (female), unspecified site 07/06/2013  . Family history of malignant neoplasm of breast 07/06/2013  . Breast cancer of upper-outer quadrant of left female breast ([place]) 05/20/2013  . Anemia 03/07/2013  . COPD mixed type ([place]) 10/28/2012  . Chest tightness 10/28/2012  . HCAP (healthcare-associated pneumonia) 09/19/2012  . Chronic anticoagulation 09/19/2012  . Acute on chronic diastolic heart failure ([place]) 07/12/2012  . Dizziness 06/26/2012  . CKD (chronic kidney disease) stage 3, GFR 30-59 ml/min (H[facility]) 06/26/2012  . Chronic diastolic heart failure ([place]) 06/26/2012  . AKI (acute kidney injury) ([place]) 06/17/2012  . Acute respiratory distress 06/14/2012  . Acute asthma exacerbation 06/14/2012  . UTI (lower urinary tract infection) 04/04/2011  . Arthritis   . Anxiety   . Hypertension   . Paroxysmal atrial fibrillation [facility])     Past Surgical History:  Procedure Laterality  Date  . ABDOMINAL HYSTERECTOMY  1962  . ADENOIDECTOMY  1949  . APPENDECTOMY  1954  . BACK SURGERY  1990  . BREAST LUMPECTOMY Left    2015  . BREAST LUMPECTOMY WITH NEEDLE LOCALIZATION Left 07/14/2013   Procedure: BREAST LUMPECTOMY WITH NEEDLE LOCALIZATION;  Surgeon: Stark Klein, MD;  Location: [facility];  Service: General;  Laterality: Left;  . CARDIAC CATHETERIZATION     1980's or 90's - reportedly normal  . CARDIOVERSION   04/05/2011   Procedure: CARDIOVERSION;  Surgeon: Coralyn Mark, MD;  Location: [facility];  Service: Cardiovascular;  Laterality: N/A;  . CARDIOVERSION N/A 06/19/2012   Procedure: CARDIOVERSION-bedside([id number]);  Surgeon: Lelon Perla, MD;  Location: [facility] OR;  Service: Cardiovascular;  Laterality: N/A;  . CATARACT EXTRACTION BILATERAL W/ ANTERIOR VITRECTOMY  2007/2008  . CHOLECYSTECTOMY    . COLONOSCOPY     "I don't know but I ain't gonna have no more"  . EXCISION / BIOPSY BREAST / NIPPLE / DUCT     left breast  . FOOT SURGERY  1947  . KNEE SURGERY      both knees  . [place] SURGERY  2010  . SHOULDER SURGERY     left  . [place] SURGERY  2008  . TONSILLECTOMY  1943  . TOTAL HIP ARTHROPLASTY  03/28/2011   Procedure: TOTAL HIP ARTHROPLASTY;  Surgeon: Yvette Rack., MD;  Location: [facility];  Service: Orthopedics;  Laterality: Right;  . TOTAL HIP ARTHROPLASTY  03/28/2011   right     OB History   No obstetric history on file.    Obstetric Comments  Menses age 26 No pregnancies 3 step-children         Home Medications    Prior to Admission medications   Medication Sig Start Date End Date Taking? Authorizing Provider  acetaminophen (TYLENOL) 500 MG tablet Take 1,000 mg by mouth 2 (two) times daily.   Yes [provider]  albuterol (PROVENTIL HFA;VENTOLIN HFA) 108 (90 BASE) MCG/ACT inhaler Inhale 1 puff into the lungs every 6 (six) hours as needed for wheezing. Reported on 04/10/2015   Yes [provider]  amLODipine (NORVASC) 10 MG tablet Take 10 mg by mouth daily.   Yes [provider]  budesonide-formoterol (SYMBICORT) 160-4.5 MCG/ACT inhaler Inhale 2 puffs into the lungs See admin instructions. Inhale 1 puff every morning, may inhale a 2nd puff in the evening if needed for wheezing   Yes [provider]  losartan (COZAAR) 100 MG tablet Take 100 mg by mouth daily.   Yes [provider]  Multiple Vitamin (MULTIVITAMIN WITH MINERALS)  TABS tablet Take 1 tablet by mouth daily.   Yes [provider]  Multiple Vitamins-Minerals (ICAPS) CAPS Take 1 capsule by mouth daily.   Yes [provider]  Nebivolol HCl (BYSTOLIC) 20 MG TABS Take 1 tablet (20 mg total) by mouth daily. 10/24/15  Yes Allred, Jeneen Rinks, MD  nitroGLYCERIN (NITROSTAT) 0.4 MG SL tablet Place 0.4 mg under the tongue every 5 (five) minutes as needed for chest pain.   Yes [provider]  omeprazole (PRILOSEC) 20 MG capsule Take 20 mg by mouth daily.   Yes [provider]  potassium chloride (K-DUR) 10 MEQ tablet Take 10 mEq by mouth daily.  04/10/15  Yes [provider]  rivaroxaban (XARELTO) 20 MG TABS tablet Take 1 tablet (20 mg total) by mouth daily with supper. Patient taking differently: Take 20 mg by mouth daily with breakfast.  10/24/15  Yes Allred, Jeneen Rinks, MD  traMADol (ULTRAM) 50 MG tablet Take 50 mg by mouth 2 (two) times daily.   Yes [provider]    Family History Family History  Problem Relation Age of Onset  . Diabetes Father        Father, Mother, 4 sisters (2 living)  . Heart attack Father        (Deceased)  . Heart failure Father        (deceased 41)  . Hypertension Father   . Stroke Mother        (deceased 57)  . Hypertension Mother   . Cancer Sister 33       breast cancer  . Breast cancer Sister 36  . Heart attack Sister   . Diabetes Sister   . Cancer Other 35       niece with breast cancer    Social History Social History   Tobacco Use  . Smoking status: Former Smoker    Packs/day: 1.00    Years: 21.00    Pack years: 21.00    Types: Cigarettes    Last attempt to quit: 08/15/1970    Years since quitting: 47.4  . Smokeless tobacco: Never Used  Substance Use Topics  . Alcohol use: No    Alcohol/week: 0.0 standard drinks  . Drug use: No     Allergies   Methocarbamol; Vicodin [hydrocodone-acetaminophen]; Aspirin; Butazolidin [phenylbutazone]; Celebrex [celecoxib]; Codeine;  Doripenem; Aspirin buf(alhyd-mghyd-cacar); Mucinex [guaifenesin er]; and Temazepam   Review of Systems Review of Systems  Constitutional: Negative.  Negative for chills and fever.  HENT: Negative.  Negative for rhinorrhea and sore throat.   Eyes: Negative.  Negative for visual disturbance.  Respiratory: Negative.  Negative for cough and shortness of breath.   Cardiovascular: Negative.  Negative for chest pain.  Gastrointestinal: Negative.  Negative for abdominal pain, blood in stool, diarrhea, nausea and vomiting.  Genitourinary: Negative.  Negative for dysuria and hematuria.  Musculoskeletal: Positive for arthralgias and gait problem (Loss of balance). Negative for myalgias and neck pain.  Skin: Positive for wound. Negative for rash.  Neurological: Negative for dizziness, syncope, weakness, numbness and headaches.   Physical Exam Updated Vital Signs BP (!) 167/64   Pulse 73   Temp 99.3 F (37.4 C) (Oral)   Resp (!) 23   Ht '5\' 6"'  (1.676 m)   SpO2 97%   BMI 40.35 kg/m   Physical Exam Constitutional:      General: She is not in acute distress.    Appearance: She is well-developed.  HENT:     Head: Normocephalic and atraumatic. No raccoon eyes or Battle's sign.     Jaw: There is normal jaw occlusion.      Comments: 2 cm diameter area of mild erythema to right forehead    Right Ear: Tympanic membrane, ear canal and external ear normal. No hemotympanum.     Left Ear: Tympanic membrane, ear canal and external ear normal. No hemotympanum.     Nose: Nose normal.     Mouth/Throat:     Lips: Pink.     Mouth: Mucous membranes are moist.     Pharynx: Oropharynx is clear. Uvula midline.  Eyes:     General: Vision grossly intact. Gaze aligned appropriately. Scleral icterus present.     Extraocular Movements: Extraocular movements intact.     Pupils: Pupils are equal, round, and reactive to light.     Comments: Visual fields grossly intact bilaterally  Neck:  Musculoskeletal:  Full passive range of motion without pain, normal range of motion and neck supple. No spinous process tenderness or muscular tenderness.     Trachea: Trachea and phonation normal. No tracheal deviation.  Cardiovascular:     Rate and Rhythm: Normal rate and regular rhythm.     Pulses:          Radial pulses are 2+ on the right side and 2+ on the left side.       Dorsalis pedis pulses are 2+ on the right side and 2+ on the left side.       Posterior tibial pulses are 2+ on the right side and 2+ on the left side.     Heart sounds: Normal heart sounds.  Pulmonary:     Effort: Pulmonary effort is normal. No respiratory distress.     Breath sounds: Normal breath sounds and air entry. No decreased breath sounds.  Chest:     Chest wall: No deformity, tenderness or crepitus.     Comments: No sign of injury to the chest Abdominal:     General: Bowel sounds are normal.     Palpations: Abdomen is soft.     Tenderness: There is no abdominal tenderness. There is no guarding or rebound.     Comments: No sign of injury to the abdomen  Musculoskeletal: Normal range of motion.     Right hip: Normal.     Left hip: She exhibits tenderness. She exhibits no bony tenderness, no crepitus and no deformity.     Right hand: She exhibits tenderness, bony tenderness and deformity. She exhibits normal two-point discrimination and normal capillary refill. Normal sensation noted.     Comments: No midline C/T/L spinal tenderness to palpation, no paraspinal muscle tenderness, no deformity, crepitus, or step-off noted. No sign of injury to the neck or back.  Hips stable to compression bilaterally.  Patient endorses mild left hip tenderness with movement and palpation.  No shortening or rotation of the lower extremities.  Right hand: Obvious deformity of the right index finger, lateral dislocation of the PIP joint of the right index finger.  Tenderness to palpation over dislocated joint.  Decreased range of motion due to  obvious dislocation.  Sensation and capillary refill intact to right index finger.  Small superficial laceration present to radial aspect of right index finger overlying the PIP joint, no joint involvement present.  Right hand is otherwise nonacute all other fingers with full range of motion and 5/5 strength, tophi noted to DIP joints of multiple fingers. No snuffbox tenderness to palpation. No tenderness to palpation over flexor sheath.  Finger adduction/abduction intact with 5/5 strength.  Thumb opposition intact. Full active and resisted ROM to flexion/extension at wrist, MCP, PIP and DIP of all other fingers.  FDS/FDP intact. Grip 5/5 strength.  Radial artery 2+ with <2sec cap refill in all fingers.  Sensation intact to light-tough in median/ulnar/radial distributions.  No sign of injury at the elbow or shoulder.  All other joints brought through range of motion without obvious deformity or pain aside from those above.   Feet:     Right foot:     Protective Sensation: 3 sites tested. 3 sites sensed.     Left foot:     Protective Sensation: 3 sites tested. 3 sites sensed.  Skin:    General: Skin is warm and dry.     Capillary Refill: Capillary refill takes less than 2 seconds.     Coloration: Skin is jaundiced.  Neurological:     Mental Status: She is alert and oriented to person, place, and time.     GCS: GCS eye subscore is 4. GCS verbal subscore is 5. GCS motor subscore is 6.     Comments: Mental Status: Alert, oriented, thought content appropriate, able to give a coherent history. Speech fluent without evidence of aphasia. Able to follow 2 step commands without difficulty. Cranial Nerves: II: Peripheral visual fields grossly normal, pupils equal, round, reactive to light III,IV, VI: ptosis not present, extra-ocular motions intact bilaterally V,VII: smile symmetric, eyebrows raise symmetric, facial light touch sensation equal VIII: hearing grossly normal to voice X: uvula  elevates symmetrically XI: bilateral shoulder shrug symmetric and strong XII: midline tongue extension without fassiculations Motor: Normal tone. 5/5 strength in upper and lower extremities bilaterally including strong and equal grip strength and dorsiflexion/plantar flexion Sensory: Sensation intact to light touch in all extremities. Cerebellar: normal finger-to-nose with bilateral upper extremities. No pronator drift.  CV: distal pulses palpable throughout  Psychiatric:        Mood and Affect: Mood normal.        Behavior: Behavior normal.    ED Treatments / Results  Labs (all labs ordered are listed, but only abnormal results are displayed) Labs Reviewed  CBC WITH DIFFERENTIAL/PLATELET - Abnormal; Notable for the following components:      Result Value   WBC 10.8 (*)    Hemoglobin 11.4 (*)    HCT 33.6 (*)    RDW 21.9 (*)    Neutro Abs 9.3 (*)    Lymphs Abs 0.3 (*)    All other components within normal limits  URINALYSIS, ROUTINE W REFLEX MICROSCOPIC - Abnormal; Notable for the following components:   Color, Urine AMBER (*)    APPearance HAZY (*)    Bilirubin Urine MODERATE (*)    Leukocytes, UA MODERATE (*)    Bacteria, UA RARE (*)    Non Squamous Epithelial 0-5 (*)    All other components within normal limits  COMPREHENSIVE METABOLIC PANEL - Abnormal; Notable for the following components:   CO2 21 (*)    Glucose, Bld 104 (*)    Creatinine, Ser 1.09 (*)    Calcium 8.8 (*)    Total Protein 5.8 (*)    Albumin 2.4 (*)    AST 212 (*)    ALT 243 (*)    Alkaline Phosphatase 905 (*)    Total Bilirubin 13.6 (*)    GFR calc non Af Amer 47 (*)    GFR calc Af Amer 55 (*)    All other components within normal limits  URINE CULTURE  LIPASE, BLOOD  CEA  CA 125    EKG EKG Interpretation  Date/Time:  Monday February 01 2018 06:18:54 EST Ventricular Rate:  72 PR Interval:    QRS Duration: 105 QT Interval:  365 QTC Calculation: 400 R Axis:   27 Text Interpretation:   Sinus rhythm Confirmed by Ripley Fraise [id number]) on 02/01/2018 6:26:57 AM Also confirmed by Ripley Fraise [phone]), editor Philomena Doheny [phone])  on 02/01/2018 1:13:44 PM   Radiology Ct Head Wo Contrast  Result Date: 02/01/2018 CLINICAL DATA:  Fall.  No loss of consciousness. EXAM: CT HEAD WITHOUT CONTRAST CT CERVICAL SPINE WITHOUT CONTRAST TECHNIQUE: Multidetector CT imaging of the head and cervical spine was performed following the standard protocol without intravenous contrast. Multiplanar CT image reconstructions of the cervical spine were also generated. COMPARISON:  None. FINDINGS: CT HEAD FINDINGS Brain: Mild chronic ischemic white  matter disease is noted. No mass effect or midline shift is noted. Ventricular size is within normal limits. There is no evidence of mass lesion, hemorrhage or acute infarction. Vascular: No hyperdense vessel or unexpected calcification. Skull: Normal. Negative for fracture or focal lesion. Sinuses/Orbits: Right maxillary mucous retention cyst is noted. Other: None. CT CERVICAL SPINE FINDINGS Alignment: Normal. Skull base and vertebrae: No acute fracture. No primary bone lesion or focal pathologic process. Soft tissues and spinal canal: No prevertebral fluid or swelling. No visible canal hematoma. Disc levels:  Status post surgical anterior fusion of C5-6 and C6-7. Upper chest: Negative. Other: Degenerative changes are seen involving posterior facet joints bilaterally. IMPRESSION: Mild chronic ischemic white matter disease. No acute intracranial abnormality seen. Postsurgical and degenerative changes are noted in the cervical spine as described above. No acute fracture or spondylolisthesis is noted. Electronically Signed   By: Marijo Conception, M.D.   On: 02/01/2018 07:21   Ct Cervical Spine Wo Contrast  Result Date: 02/01/2018 CLINICAL DATA:  Fall.  No loss of consciousness. EXAM: CT HEAD WITHOUT CONTRAST CT CERVICAL SPINE WITHOUT CONTRAST TECHNIQUE: Multidetector  CT imaging of the head and cervical spine was performed following the standard protocol without intravenous contrast. Multiplanar CT image reconstructions of the cervical spine were also generated. COMPARISON:  None. FINDINGS: CT HEAD FINDINGS Brain: Mild chronic ischemic white matter disease is noted. No mass effect or midline shift is noted. Ventricular size is within normal limits. There is no evidence of mass lesion, hemorrhage or acute infarction. Vascular: No hyperdense vessel or unexpected calcification. Skull: Normal. Negative for fracture or focal lesion. Sinuses/Orbits: Right maxillary mucous retention cyst is noted. Other: None. CT CERVICAL SPINE FINDINGS Alignment: Normal. Skull base and vertebrae: No acute fracture. No primary bone lesion or focal pathologic process. Soft tissues and spinal canal: No prevertebral fluid or swelling. No visible canal hematoma. Disc levels:  Status post surgical anterior fusion of C5-6 and C6-7. Upper chest: Negative. Other: Degenerative changes are seen involving posterior facet joints bilaterally. IMPRESSION: Mild chronic ischemic white matter disease. No acute intracranial abnormality seen. Postsurgical and degenerative changes are noted in the cervical spine as described above. No acute fracture or spondylolisthesis is noted. Electronically Signed   By: Marijo Conception, M.D.   On: 02/01/2018 07:21   Ct Abdomen Pelvis W Contrast  Result Date: 02/01/2018 CLINICAL DATA:  84 year old female with history of jaundice. Dark urine. EXAM: CT ABDOMEN AND PELVIS WITH CONTRAST TECHNIQUE: Multidetector CT imaging of the abdomen and pelvis was performed using the standard protocol following bolus administration of intravenous contrast. CONTRAST:  174m OMNIPAQUE IOHEXOL 300 MG/ML  SOLN COMPARISON:  No priors. FINDINGS: Lower chest: Aortic atherosclerosis. Atherosclerotic calcifications in the right coronary artery. Hepatobiliary: In the left lobe of the liver there is a central  lesion that is poorly defined but estimated to measure approximately 5.5 x 3.3 x 3.6 cm (axial image 18 of series 3 and coronal image 51 of series 6). This is associated with severe intrahepatic biliary ductal dilatation, which is most severe throughout segments 2 and 3, but involves all areas of the liver. Status post cholecystectomy. Common bile duct is not dilated. Pancreas: No pancreatic mass. No pancreatic ductal dilatation. No pancreatic or peripancreatic fluid or inflammatory changes. Spleen: Unremarkable. Adrenals/Urinary Tract: 1.4 cm with simple cyst in the lower pole of the right kidney. Left kidney is normal in appearance. No hydroureteronephrosis. Urinary bladder is normal in appearance. Bilateral adrenal glands are normal in appearance.  Stomach/Bowel: Normal appearance of the stomach. No pathologic dilatation of small bowel or colon. Normal appendix. Vascular/Lymphatic: Aortic atherosclerosis, without evidence of aneurysm or dissection in the abdominal or pelvic vasculature. No lymphadenopathy noted in the abdomen or pelvis. Reproductive: Status post hysterectomy. Ovaries are not confidently identified and may be surgically absent or atrophic. Other: No significant volume of ascites.  No pneumoperitoneum. Musculoskeletal: Status post right hip arthroplasty. There are no aggressive appearing lytic or blastic lesions noted in the visualized portions of the skeleton. IMPRESSION: 1. Ill-defined central hepatic mass in the left lobe of the liver estimated to measure approximately 5.5 x 3.3 x 3.6 cm with associated severe intrahepatic biliary ductal dilatation. Findings are highly concerning for cholangiocarcinoma. 2. Aortic atherosclerosis, in addition to at least right coronary artery disease. 3. Additional incidental findings, as above. Electronically Signed   By: Vinnie Langton M.D.   On: 02/01/2018 14:53   Dg Hand Complete Right  Result Date: 02/01/2018 CLINICAL DATA:  Status post dislocation at  the PIP joint of the index finger of the right hand. Post reduction views. EXAM: RIGHT HAND - COMPLETE 3+ VIEW COMPARISON:  Prevertebral in image of today's date at 6:29 a.m. FINDINGS: The previously dislocated PIP joint is now in normal alignment. There is a small bony density adjacent to the dorso-ulnar aspect of the head of the proximal phalanx of the index finger which may reflect an avulsion fracture. The PIP joint space is well maintained. There is severe osteoarthritic joint space loss with surrounding proliferative changes involving the DIP joint of the second as well as third, fourth, and fifth fingers. There are degenerative changes of the first [facility] joint. IMPRESSION: Successful relocation of the previously laterally dislocated index finger at the PIP joint. Possible small avulsion from the ulnar aspect of the head of the proximal phalanx. Significant DIP joint osteoarthritic change of the second through fifth digits and of the first [facility] joint. Electronically Signed   By: David  [place]ique M.D.   On: 02/01/2018 08:01   Dg Hand Complete Right  Result Date: 02/01/2018 CLINICAL DATA:  Status post fall, hitting right hand on tub, with deformity of the right index finger. Initial encounter. EXAM: RIGHT HAND - COMPLETE 3+ VIEW COMPARISON:  None. FINDINGS: There is dislocation at the second proximal interphalangeal joint, with ulnar and dorsal angulation. No definite fracture is characterized. Degenerative change at the first carpometacarpal joint, and at the distal interphalangeal joints, raises concern for osteoarthritis. No definite soft tissue abnormalities are characterized on radiograph. Negative ulnar variance is noted. IMPRESSION: Dislocation at the second proximal interphalangeal joint, with ulnar and dorsal angulation. No definite fracture seen. Electronically Signed   By: Garald Balding M.D.   On: 02/01/2018 06:49   Dg Hips Bilat W Or Wo Pelvis 2 Views  Result Date: 02/01/2018 CLINICAL DATA:   Status post fall, with concern for pelvic injury. Initial encounter. EXAM: DG HIP (WITH OR WITHOUT PELVIS) 2V BILAT COMPARISON:  None. FINDINGS: There is no evidence of fracture or dislocation. The patient's right hip arthroplasty is grossly unremarkable in appearance, without evidence of significant loosening. The proximal left femur appears intact. Mild degenerative change is noted at the lower lumbar spine. The sacroiliac joints are unremarkable in appearance. The visualized bowel gas pattern is grossly unremarkable in appearance. Scattered phleboliths are noted within the pelvis. IMPRESSION: No evidence of fracture or dislocation. Right hip arthroplasty is grossly unremarkable in appearance. Electronically Signed   By: Garald Balding M.D.   On: 02/01/2018  06:51    Procedures .Ortho Injury Treatment Date/Time: 02/01/2018 7:55 AM Performed by: Deliah Boston, PA-C Authorized by: Deliah Boston, PA-C   Consent:    Consent obtained:  Verbal   Consent given by:  Patient   Risks discussed:  Fracture, nerve damage, restricted joint movement, vascular damage, stiffness, recurrent dislocation and irreducible dislocationInjury location: finger Location details: right index finger Injury type: dislocation Dislocation type: PIP Pre-procedure neurovascular assessment: neurovascularly intact Pre-procedure distal perfusion: normal Pre-procedure neurological function: normal Pre-procedure range of motion: reduced Anesthesia: digital block  Anesthesia: Local anesthesia used: yes Local Anesthetic: lidocaine 1% without epinephrine Anesthetic total: 5 mL  Patient sedated: NoImmobilization: splint Splint type: static finger Supplies used: aluminum splint Post-procedure neurovascular assessment: post-procedure neurovascularly intact Post-procedure distal perfusion: normal Post-procedure neurological function: normal Post-procedure range of motion: normal Patient tolerance: Patient tolerated  the procedure well with no immediate complications    (including critical care time)  Medications Ordered in ED Medications  traMADol (ULTRAM) tablet 50 mg (has no administration in time range)  amLODipine (NORVASC) tablet 10 mg (has no administration in time range)  losartan (COZAAR) tablet 100 mg (has no administration in time range)  Nebivolol HCl TABS 20 mg (has no administration in time range)  albuterol (PROVENTIL) (2.5 MG/3ML) 0.083% nebulizer solution 3 mL (has no administration in time range)  mometasone-formoterol (DULERA) 200-5 MCG/ACT inhaler 2 puff (has no administration in time range)  acetaminophen (TYLENOL) tablet 650 mg (has no administration in time range)    Or  acetaminophen (TYLENOL) suppository 650 mg (has no administration in time range)  ondansetron (ZOFRAN) tablet 4 mg (has no administration in time range)    Or  ondansetron (ZOFRAN) injection 4 mg (has no administration in time range)  morphine 2 MG/ML injection 2 mg (has no administration in time range)  sodium chloride 0.9 % bolus 1,000 mL (0 mLs Intravenous Stopped 02/01/18 0852)  lidocaine (PF) (XYLOCAINE) 1 % injection 5 mL (5 mLs Infiltration Given 02/01/18 0628)  Tdap (BOOSTRIX) injection 0.5 mL (0.5 mLs Intramuscular Given 02/01/18 1000)  iohexol (OMNIPAQUE) 300 MG/ML solution 100 mL (100 mLs Intravenous Contrast Given 02/01/18 1359)   Initial Impression / Assessment and Plan / ED Course  I have reviewed the triage vital signs and the nursing notes.  Pertinent labs & imaging results that were available during my care of the patient were reviewed by me and considered in my medical decision making (see chart for details).  Clinical Course as of Feb 01 1541  Mon Feb 01, 2018  0727 Jaundice and fell--admit for jaundice likely, finger relocation   [MB]    Clinical Course User Index [MB] Maudie Flakes, MD   84 year old female presenting today following fall that occurred early this morning.  Patient  with deformity to right index finger that was anesthetized and reduced as above.  Patient without chest pain, shortness of breath, dizziness or lightheadedness prior to fall, does not appear mechanical but patient states that she falls often and this is not new for her.  No loss of consciousness.  Additionally patient jaundiced.  Patient seen and evaluated by Dr. Christy Gentles during this visit who agrees with work-up and likely admission, discussed CT abdomen pelvis with Dr. Christy Gentles, patient without abdominal pain today, do not believe that emergency department CT scan of abdomen/pelvis is necessary, may follow-up in hospital for this and evaluation of cause of jaundice.  CT head and cervical spine without acute findings DG hips bilateral without acute findings  Imaging of right hand pre-and postreduction:  IMPRESSION:  Successful relocation of the previously laterally dislocated index  finger at the PIP joint. Possible small avulsion from the ulnar  aspect of the head of the proximal phalanx. 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  Encouraged patient to follow-up with orthopedics, splint in place.  Small superficial laceration overlying radial aspect of PIP joint does not appear to involve the joint, Tdap updated, discussed with Dr. Christy Gentles, do not believe antibiotics are warranted at this time.  CBC nonacute CMP with multiple abnormalities, bilirubin 13.6, AST and ALT greater than 200, alk phos 905 Lipase within normal limits Urinalysis with bilirubin and leukocytes, sent for culture EKG nonacute reviewed by Dr. Christy Gentles  10:35 AM: Discussed case in person with hospitalist Dr. Lorin Mercy who will see patient here in emergency department.  CT abdomen pelvis ordered by Dr. Lorin Mercy. ---------------------------------------------- Case discussed with Dr. Sedonia Small who has taken over at shift change, agrees with workup and admission at this  time. ----------------------------------------------- Patient has been admitted to hospital by Dr. Lorin Mercy.  Note: Portions of this report may have been transcribed using voice recognition software. Every effort was made to ensure accuracy; however, inadvertent computerized transcription errors may still be present. Final Clinical Impressions(s) / ED Diagnoses   Final diagnoses:  Fall, initial encounter  Dislocation of finger, initial encounter  Elevated bilirubin  Elevated liver function tests    ED Discharge Orders    None       Gari Crown 02/01/18 1542    Maudie Flakes, MD 02/04/18 1453

## 2018-02-01 NOTE — ED Triage Notes (Signed)
States she got up to go to the bathroom lost her balance and fell hitting her right hand on the tub. Obv. Deformity right index finger. Patient looks jaundice. States her urine has been very dark. Patient is currently alert oriented.

## 2018-02-01 NOTE — H&P (Signed)
History and Physical    Jordan Byrd:[id number] DOB: December 22, 1933 DOA: 02/01/2018  PCP: Levin Erp, MD Consultants:  [facility] - orthopedics; [facility] - cardiology Patient coming from:  Home - lives alone; [place]: grandson, [phone]  Chief Complaint: Fall  HPI: Jordan Byrd is a 84 y.o. female with medical history significant of HTN; CHF; remote breast cancer; and afib on Xarelto presenting with a fall.  She came in for a fall in the bathroom at 0400.  She fell because she doesn't get around very well.  She has been losing her balance a lot since Thanksgiving and her knee/ankle have been giving out.  Not light-headed or dizzy.  She just loses her balance and falls.  She has not been feeling well since before Thanksgiving.  She is nauseated, difficulty eating, can't do anything, "I mean I'm just sick all over."  She has not been vomiting.  She eats a few bites and feels nauseated and can't continue to eat.  She has been having GERD at night.  They are concerned about weight loss, but she denies weight loss.  No night sweats.  No LAD.  No fevers.  No pain other than her "stomach, it rolls" when she is sick.  Grandson's wife noticed she is yellow but she has not noticed any color change.  Her urine has been "brown, almost" and so they sent a specimen to the office and they ordered lab work for her, not done yet.   ED Course:  Came in for a fall.  Frequent falls, no dizziness or other symptoms prior.  Finger dislocation, relocated and splint in place.  Found to be quite jaundiced.  Bili 13.6.  Has not had imaging to determine etiology for jaundice.  Review of Systems: As per HPI; otherwise review of systems reviewed and negative.   Ambulatory Status:  Ambulates with a walker  Past Medical History:  Diagnosis Date  . Anemia   . Arthritis   . Asthma   . Atrial fibrillation ([facility])    on Xarelto  . Breast cancer ([place]) 05/19/13   left breast stage IIA   . Cataracts, both eyes   . CHF  (congestive heart failure) ([place])   . GERD (gastroesophageal reflux disease)    [name] Prilosec  . Hypertension   . Macular degeneration   . Obstructive jaundice 02/01/2018  . Osteoarthritis    a. 03/28/11 - R Total Hip Arthroplasty  . Pleurisy   . Pneumonia     Past Surgical History:  Procedure Laterality Date  . ABDOMINAL HYSTERECTOMY  1962  . ADENOIDECTOMY  1949  . APPENDECTOMY  1954  . BACK SURGERY  1990  . BREAST LUMPECTOMY Left    2015  . BREAST LUMPECTOMY WITH NEEDLE LOCALIZATION Left 07/14/2013   Procedure: BREAST LUMPECTOMY WITH NEEDLE LOCALIZATION;  Surgeon: Stark Klein, MD;  Location: [facility];  Service: General;  Laterality: Left;  . CARDIAC CATHETERIZATION     1980's or 90's - reportedly normal  . CARDIOVERSION  04/05/2011   Procedure: CARDIOVERSION;  Surgeon: Coralyn Mark, MD;  Location: [facility];  Service: Cardiovascular;  Laterality: N/A;  . CARDIOVERSION N/A 06/19/2012   Procedure: CARDIOVERSION-bedside([id number]);  Surgeon: Lelon Perla, MD;  Location: [facility];  Service: Cardiovascular;  Laterality: N/A;  . CATARACT EXTRACTION BILATERAL W/ ANTERIOR VITRECTOMY  2007/2008  . CHOLECYSTECTOMY    . COLONOSCOPY     "I don't know but I ain't gonna have no more"  . EXCISION / BIOPSY BREAST /  NIPPLE / DUCT     left breast  . FOOT SURGERY  1947  . KNEE SURGERY      both knees  . [place] SURGERY  2010  . SHOULDER SURGERY     left  . [place] SURGERY  2008  . TONSILLECTOMY  1943  . TOTAL HIP ARTHROPLASTY  03/28/2011   Procedure: TOTAL HIP ARTHROPLASTY;  Surgeon: Yvette Rack., MD;  Location: [facility];  Service: Orthopedics;  Laterality: Right;  . TOTAL HIP ARTHROPLASTY  03/28/2011   right    Social History   Socioeconomic History  . Marital status: Widowed    Spouse name: Not on file  . Number of children: Not on file  . Years of education: Not on file  . Highest education level: Not on file  Occupational History  . Occupation: Retired  Scientific laboratory technician  .  Financial resource strain: Not on file  . Food insecurity:    Worry: Not on file    Inability: Not on file  . Transportation needs:    Medical: Not on file    Non-medical: Not on file  Tobacco Use  . Smoking status: Former Smoker    Packs/day: 1.00    Years: 21.00    Pack years: 21.00    Types: Cigarettes    Last attempt to quit: 08/15/1970    Years since quitting: 47.4  . Smokeless tobacco: Never Used  Substance and Sexual Activity  . Alcohol use: No    Alcohol/week: 0.0 standard drinks  . Drug use: No  . Sexual activity: Not Currently  Lifestyle  . Physical activity:    Days per week: Not on file    Minutes per session: Not on file  . Stress: Not on file  Relationships  . Social connections:    Talks on phone: Not on file    Gets together: Not on file    Attends religious service: Not on file    Active member of club or organization: Not on file    Attends meetings of clubs or organizations: Not on file    Relationship status: Not on file  . Intimate partner violence:    Fear of current or ex partner: Not on file    Emotionally abused: Not on file    Physically abused: Not on file    Forced sexual activity: Not on file  Other Topics Concern  . Not on file  Social History Narrative   Lives alone in [place].  Widowed in 2012 (husband w/ dementia & parkinsons)..   Not active, does not drive due to vision problems.    Allergies  Allergen Reactions  . Methocarbamol Hives  . Vicodin [Hydrocodone-Acetaminophen] Other (See Comments)    Interacts with bp medication and drops blood pressure  . Aspirin Nausea And Vomiting  . Butazolidin [Phenylbutazone] Other (See Comments)    Unknown reaction  . Celebrex [Celecoxib] Nausea And Vomiting  . Codeine Nausea And Vomiting  . Doripenem Rash  . Aspirin Buf(Alhyd-Mghyd-Cacar) Nausea And Vomiting  . Mucinex [Guaifenesin Er] Itching  . Temazepam Other (See Comments)    Unknown reaction    Family History  Problem Relation Age  of Onset  . Diabetes Father        Father, Mother, 4 sisters (2 living)  . Heart attack Father        (Deceased)  . Heart failure Father        (deceased 61)  . Hypertension Father   .  Stroke Mother        (deceased 80)  . Hypertension Mother   . Cancer Sister 58       breast cancer  . Breast cancer Sister 47  . Heart attack Sister   . Diabetes Sister   . Cancer Other 21       niece with breast cancer    Prior to Admission medications   Medication Sig Start Date End Date Taking? Authorizing Provider  acetaminophen (TYLENOL) 500 MG tablet Take 1,000 mg by mouth 2 (two) times daily.   Yes [provider]  albuterol (PROVENTIL HFA;VENTOLIN HFA) 108 (90 BASE) MCG/ACT inhaler Inhale 1 puff into the lungs every 6 (six) hours as needed for wheezing. Reported on 04/10/2015   Yes [provider]  amLODipine (NORVASC) 10 MG tablet Take 10 mg by mouth daily.   Yes [provider]  budesonide-formoterol (SYMBICORT) 160-4.5 MCG/ACT inhaler Inhale 2 puffs into the lungs See admin instructions. Inhale 1 puff every morning, may inhale a 2nd puff in the evening if needed for wheezing   Yes [provider]  losartan (COZAAR) 100 MG tablet Take 100 mg by mouth daily.   Yes [provider]  Multiple Vitamin (MULTIVITAMIN WITH MINERALS) TABS tablet Take 1 tablet by mouth daily.   Yes [provider]  Multiple Vitamins-Minerals (ICAPS) CAPS Take 1 capsule by mouth daily.   Yes [provider]  Nebivolol HCl (BYSTOLIC) 20 MG TABS Take 1 tablet (20 mg total) by mouth daily. 10/24/15  Yes [facility], Jeneen Rinks, MD  nitroGLYCERIN (NITROSTAT) 0.4 MG SL tablet Place 0.4 mg under the tongue every 5 (five) minutes as needed for chest pain.   Yes [provider]  omeprazole (PRILOSEC) 20 MG capsule Take 20 mg by mouth daily.   Yes [provider]  potassium chloride (K-DUR) 10 MEQ tablet Take 10 mEq by mouth daily.  04/10/15  Yes [provider]  rivaroxaban (XARELTO) 20 MG TABS tablet Take 1 tablet (20 mg total) by mouth daily with supper. Patient taking differently: Take 20 mg by mouth daily with breakfast.  10/24/15  Yes [facility], Jeneen Rinks, MD  traMADol (ULTRAM) 50 MG tablet Take 50 mg by mouth 2 (two) times daily.   Yes [provider]  flecainide (TAMBOCOR) 50 MG tablet Take 1 tablet (50 mg total) by mouth every 12 (twelve) hours. Patient not taking: Reported on 07/17/2017 04/25/15   Thompson Grayer, MD  furosemide (LASIX) 20 MG tablet Take 1 tablet (20 mg total) by mouth daily. Patient not taking: Reported on 07/17/2017 02/15/15   Cristal Ford, DO  oxyCODONE-acetaminophen (PERCOCET) 5-325 MG tablet Take 1 tablet by mouth every 4 (four) hours as needed. Patient not taking: Reported on 07/17/2017 04/20/16   Nat Christen, MD    Physical Exam: Vitals:   02/01/18 1230 02/01/18 1330 02/01/18 1400 02/01/18 1430  BP: (!) 169/74 (!) 141/71 (!) 158/81 (!) 167/64  Pulse: 70 75 76 73  Resp: (!) 26 (!) 24 (!) 22 (!) 23  Temp:      TempSrc:      SpO2: 97% 97% 96% 97%  Height:         General:  Appears calm and comfortable and is NAD; she is quite jaundiced Eyes:  PERRL, EOMI, normal lids, iris; marked scleral icterus ENT:  grossly normal hearing, lips & tongue, mmm Neck:  no LAD, masses or thyromegaly Cardiovascular:  RRR, no m/r/g. No LE edema.  Respiratory:   CTA bilaterally with  no wheezes/rales/rhonchi.  Mildly increased respiratory effort. Abdomen:  soft, mild bloating, ND, NABS Skin:  no rash or induration seen on limited exam; she is quite janudiced Musculoskeletal:  grossly normal tone BUE/BLE, good ROM, no bony abnormality Psychiatric:  grossly normal mood and affect, speech fluent and appropriate, AOx3 Neurologic:  CN 2-12 grossly intact, moves all extremities in coordinated fashion, sensation intact    Radiological Exams on Admission: Ct Head Wo Contrast  Result Date: 02/01/2018 CLINICAL DATA:   Fall.  No loss of consciousness. EXAM: CT HEAD WITHOUT CONTRAST CT CERVICAL SPINE WITHOUT CONTRAST TECHNIQUE: Multidetector CT imaging of the head and cervical spine was performed following the standard protocol without intravenous contrast. Multiplanar CT image reconstructions of the cervical spine were also generated. COMPARISON:  None. FINDINGS: CT HEAD FINDINGS Brain: Mild chronic ischemic white matter disease is noted. No mass effect or midline shift is noted. Ventricular size is within normal limits. There is no evidence of mass lesion, hemorrhage or acute infarction. Vascular: No hyperdense vessel or unexpected calcification. Skull: Normal. Negative for fracture or focal lesion. Sinuses/Orbits: Right maxillary mucous retention cyst is noted. Other: None. CT CERVICAL SPINE FINDINGS Alignment: Normal. Skull base and vertebrae: No acute fracture. No primary bone lesion or focal pathologic process. Soft tissues and spinal canal: No prevertebral fluid or swelling. No visible canal hematoma. Disc levels:  Status post surgical anterior fusion of C5-6 and C6-7. Upper chest: Negative. Other: Degenerative changes are seen involving posterior facet joints bilaterally. IMPRESSION: Mild chronic ischemic white matter disease. No acute intracranial abnormality seen. Postsurgical and degenerative changes are noted in the cervical spine as described above. No acute fracture or spondylolisthesis is noted. Electronically Signed   By: Marijo Conception, M.D.   On: 02/01/2018 07:21   Ct Cervical Spine Wo Contrast  Result Date: 02/01/2018 CLINICAL DATA:  Fall.  No loss of consciousness. EXAM: CT HEAD WITHOUT CONTRAST CT CERVICAL SPINE WITHOUT CONTRAST TECHNIQUE: Multidetector CT imaging of the head and cervical spine was performed following the standard protocol without intravenous contrast. Multiplanar CT image reconstructions of the cervical spine were also generated. COMPARISON:  None. FINDINGS: CT HEAD FINDINGS Brain: Mild  chronic ischemic white matter disease is noted. No mass effect or midline shift is noted. Ventricular size is within normal limits. There is no evidence of mass lesion, hemorrhage or acute infarction. Vascular: No hyperdense vessel or unexpected calcification. Skull: Normal. Negative for fracture or focal lesion. Sinuses/Orbits: Right maxillary mucous retention cyst is noted. Other: None. CT CERVICAL SPINE FINDINGS Alignment: Normal. Skull base and vertebrae: No acute fracture. No primary bone lesion or focal pathologic process. Soft tissues and spinal canal: No prevertebral fluid or swelling. No visible canal hematoma. Disc levels:  Status post surgical anterior fusion of C5-6 and C6-7. Upper chest: Negative. Other: Degenerative changes are seen involving posterior facet joints bilaterally. IMPRESSION: Mild chronic ischemic white matter disease. No acute intracranial abnormality seen. Postsurgical and degenerative changes are noted in the cervical spine as described above. No acute fracture or spondylolisthesis is noted. Electronically Signed   By: Marijo Conception, M.D.   On: 02/01/2018 07:21   Ct Abdomen Pelvis W Contrast  Result Date: 02/01/2018 CLINICAL DATA:  84 year old female with history of jaundice. Dark urine. EXAM: CT ABDOMEN AND PELVIS WITH CONTRAST TECHNIQUE: Multidetector CT imaging of the abdomen and pelvis was performed using the standard protocol following bolus administration of intravenous contrast. CONTRAST:  181mL OMNIPAQUE IOHEXOL 300 MG/ML  SOLN COMPARISON:  No priors.  FINDINGS: Lower chest: Aortic atherosclerosis. Atherosclerotic calcifications in the right coronary artery. Hepatobiliary: In the left lobe of the liver there is a central lesion that is poorly defined but estimated to measure approximately 5.5 x 3.3 x 3.6 cm (axial image 18 of series 3 and coronal image 51 of series 6). This is associated with severe intrahepatic biliary ductal dilatation, which is most severe throughout  segments 2 and 3, but involves all areas of the liver. Status post cholecystectomy. Common bile duct is not dilated. Pancreas: No pancreatic mass. No pancreatic ductal dilatation. No pancreatic or peripancreatic fluid or inflammatory changes. Spleen: Unremarkable. Adrenals/Urinary Tract: 1.4 cm with simple cyst in the lower pole of the right kidney. Left kidney is normal in appearance. No hydroureteronephrosis. Urinary bladder is normal in appearance. Bilateral adrenal glands are normal in appearance. Stomach/Bowel: Normal appearance of the stomach. No pathologic dilatation of small bowel or colon. Normal appendix. Vascular/Lymphatic: Aortic atherosclerosis, without evidence of aneurysm or dissection in the abdominal or pelvic vasculature. No lymphadenopathy noted in the abdomen or pelvis. Reproductive: Status post hysterectomy. Ovaries are not confidently identified and may be surgically absent or atrophic. Other: No significant volume of ascites.  No pneumoperitoneum. Musculoskeletal: Status post right hip arthroplasty. There are no aggressive appearing lytic or blastic lesions noted in the visualized portions of the skeleton. IMPRESSION: 1. Ill-defined central hepatic mass in the left lobe of the liver estimated to measure approximately 5.5 x 3.3 x 3.6 cm with associated severe intrahepatic biliary ductal dilatation. Findings are highly concerning for cholangiocarcinoma. 2. Aortic atherosclerosis, in addition to at least right coronary artery disease. 3. Additional incidental findings, as above. Electronically Signed   By: Vinnie Langton M.D.   On: 02/01/2018 14:53   Dg Hand Complete Right  Result Date: 02/01/2018 CLINICAL DATA:  Status post dislocation at the PIP joint of the index finger of the right hand. Post reduction views. EXAM: RIGHT HAND - COMPLETE 3+ VIEW COMPARISON:  Prevertebral in image of today's date at 6:29 a.m. FINDINGS: The previously dislocated PIP joint is now in normal alignment. There  is a small bony density adjacent to the dorso-ulnar aspect of the head of the proximal phalanx of the index finger which may reflect an avulsion fracture. The PIP joint space is well maintained. There is severe osteoarthritic joint space loss with surrounding proliferative changes involving the DIP joint of the second as well as third, fourth, and fifth fingers. There are degenerative changes of the first [facility] joint. IMPRESSION: Successful relocation of the previously laterally dislocated index finger at the PIP joint. Possible small avulsion from the ulnar aspect of the head of the proximal phalanx. Significant DIP joint osteoarthritic change of the second through fifth digits and of the first [facility] joint. Electronically Signed   By: David  Martinique M.D.   On: 02/01/2018 08:01   Dg Hand Complete Right  Result Date: 02/01/2018 CLINICAL DATA:  Status post fall, hitting right hand on tub, with deformity of the right index finger. Initial encounter. EXAM: RIGHT HAND - COMPLETE 3+ VIEW COMPARISON:  None. FINDINGS: There is dislocation at the second proximal interphalangeal joint, with ulnar and dorsal angulation. No definite fracture is characterized. Degenerative change at the first carpometacarpal joint, and at the distal interphalangeal joints, raises concern for osteoarthritis. No definite soft tissue abnormalities are characterized on radiograph. Negative ulnar variance is noted. IMPRESSION: Dislocation at the second proximal interphalangeal joint, with ulnar and dorsal angulation. No definite fracture seen. Electronically Signed   By:  Garald Balding M.D.   On: 02/01/2018 06:49   Dg Hips Bilat W Or Wo Pelvis 2 Views  Result Date: 02/01/2018 CLINICAL DATA:  Status post fall, with concern for pelvic injury. Initial encounter. EXAM: DG HIP (WITH OR WITHOUT PELVIS) 2V BILAT COMPARISON:  None. FINDINGS: There is no evidence of fracture or dislocation. The patient's right hip arthroplasty is grossly unremarkable in  appearance, without evidence of significant loosening. The proximal left femur appears intact. Mild degenerative change is noted at the lower lumbar spine. The sacroiliac joints are unremarkable in appearance. The visualized bowel gas pattern is grossly unremarkable in appearance. Scattered phleboliths are noted within the pelvis. IMPRESSION: No evidence of fracture or dislocation. Right hip arthroplasty is grossly unremarkable in appearance. Electronically Signed   By: Garald Balding M.D.   On: 02/01/2018 06:51    EKG: Independently reviewed.  NSR with rate 72;  no evidence of acute ischemia   Labs on Admission: I have personally reviewed the available labs and imaging studies at the time of the admission.  Pertinent labs:   CO2 21 Glucose 104 BUN 21/Creatinine 1.09/GFR 47 AP 905 Albumin 2.4 AST 212/ALT 243/Bili 13.6 WBC 10.8 Hgb 11.4  Assessment/Plan Principal Problem:   Obstructive jaundice Active Problems:   Hypertension   CKD (chronic kidney disease) stage 3, GFR 30-59 ml/min (HCC)   Chronic diastolic heart failure (HCC)   Atrial fibrillation (HCC)   Falls frequently   Obstructive jaundice -Patient presenting with recent frequent falls -Was noted to have marked jaundice on exam -Subsequently noted to have elevated LFTs with bili 13.6 -I was consulted to admit prior to evaluation for this issue -Since it was not clear where the patient would need to be admitted [facility]. [facility]) or even if she needed to be admitted, CT ordered for further evaluation -CT shows a baseball-sized mass in the left lobe of the liver, highly suspicious for cholangiocarcinoma -GI has been consulted, Dr. Therisa Doyne to see -However, the lesion does not appear to affect the CBD and so GI stenting is unlikely to be beneficial -IR consult has been requested for stent/biopsy -Patient will be admitted -NPO for now -Hold Xarelto -Pain control with morphine and tramadol if needed; nausea control with  Zofran -Will check tumor markers - CA-125, CEA, CA 19-9  Falls -Suspect that falls are related to underlying malignancy and inability of patient to take PO -Will follow -She may benefit from PT evaluation once her trajectory is clarified -She lives alone with her grandson next door; she may need to consider short-term placement  Afib on Xarelto -Rate controlled with Bystolic -Hold Xarelto -If this is not to be resumed in the next 1-2 days, consider heparin infusion  Chronic diastolic CHF -Grade 2 diastolic function on 6712 echo -Appears to be compensated at this time  Stage 3 CKD -Appears to be stable at this time, despite report of early satiety and anorexia  HTN -Continue home meds - Bystolic today and Norvasc and Cozaar tomorrow    DVT prophylaxis:  SCDs Code Status: DNR - confirmed with patient/family Family Communication: Yolanda Bonine was present throughout evaluation Disposition Plan:  Home once clinically improved Consults called: GI; IR  Admission status: Admit - It is my clinical opinion that admission to INPATIENT is reasonable and necessary because of the expectation that this patient will require hospital care that crosses at least 2 midnights to treat this condition based on the medical complexity of the problems presented.  Given the aforementioned information, the  predictability of an adverse outcome is felt to be significant.    Karmen Bongo MD [facility]  If note is complete, please contact covering daytime or nighttime physician. [email or web address] Password [email or web address]  02/01/2018, 4:15 PM

## 2018-02-01 NOTE — Consult Note (Addendum)
[facility] Consult  Referring Provider: Karmen Bongo, MD/[facility] Primary Care Physician:  Levin Erp, MD Primary Gastroenterologist: [email or web address]  Reason for Consultation:  Jaundice, liver mass  HPI: Jordan Byrd is a 84 y.o. female presentedto the ER after sustaining a fall,due to losing her balance, which has been a pretty frequent event in the last several months. During the ER visit,she was found to be jaundiced, although she denies change in the color of her skin or eyes, she has noticed that the stool has been clay colored for the last several days. Patient denies fevers or abdominal pain. She has a decrease in appetite but denies unintentional weight loss. Last colonoscopy was in 2014 and was unremarkable. Last EGD was in 2014,and fundic gland polyps were noted. She reports difficulty swallowing solids as well as liquids for the last several months, which is intermittent and non progressive. Patient is on Xarelto for atrial fibrillation.   Past Medical History:  Diagnosis Date  . Anemia   . Arthritis   . Asthma   . Atrial fibrillation ([place])    on Xarelto  . Breast cancer ([place]) 05/19/13   left breast stage IIA   . Cataracts, both eyes   . CHF (congestive heart failure) ([place])   . GERD (gastroesophageal reflux disease)    [name] Prilosec  . Hypertension   . Macular degeneration   . Osteoarthritis    a. 03/28/11 - R Total Hip Arthroplasty  . Pleurisy   . Pneumonia     Past Surgical History:  Procedure Laterality Date  . ABDOMINAL HYSTERECTOMY  1962  . ADENOIDECTOMY  1949  . APPENDECTOMY  1954  . BACK SURGERY  1990  . BREAST LUMPECTOMY Left    2015  . BREAST LUMPECTOMY WITH NEEDLE LOCALIZATION Left 07/14/2013   Procedure: BREAST LUMPECTOMY WITH NEEDLE LOCALIZATION;  Surgeon: Stark Klein, MD;  Location: [facility];  Service: General;  Laterality: Left;  . CARDIAC CATHETERIZATION     1980's or 90's - reportedly normal  . CARDIOVERSION  04/05/2011    Procedure: CARDIOVERSION;  Surgeon: Coralyn Mark, MD;  Location: [facility];  Service: Cardiovascular;  Laterality: N/A;  . CARDIOVERSION N/A 06/19/2012   Procedure: CARDIOVERSION-bedside([id number]);  Surgeon: Lelon Perla, MD;  Location: [facility] OR;  Service: Cardiovascular;  Laterality: N/A;  . CATARACT EXTRACTION BILATERAL W/ ANTERIOR VITRECTOMY  2007/2008  . CHOLECYSTECTOMY    . COLONOSCOPY     "I don't know but I ain't gonna have no more"  . EXCISION / BIOPSY BREAST / NIPPLE / DUCT     left breast  . FOOT SURGERY  1947  . KNEE SURGERY      both knees  . [place] SURGERY  2010  . SHOULDER SURGERY     left  . [place] SURGERY  2008  . TONSILLECTOMY  1943  . TOTAL HIP ARTHROPLASTY  03/28/2011   Procedure: TOTAL HIP ARTHROPLASTY;  Surgeon: Yvette Rack., MD;  Location: [facility];  Service: Orthopedics;  Laterality: Right;  . TOTAL HIP ARTHROPLASTY  03/28/2011   right    Prior to Admission medications   Medication Sig Start Date End Date Taking? Authorizing Provider  acetaminophen (TYLENOL) 500 MG tablet Take 1,000 mg by mouth 2 (two) times daily.   Yes [provider]  albuterol (PROVENTIL HFA;VENTOLIN HFA) 108 (90 BASE) MCG/ACT inhaler Inhale 1 puff into the lungs every 6 (six) hours as needed for wheezing. Reported on 04/10/2015   Yes [provider]  amLODipine (NORVASC) 10 MG tablet Take 10 mg by mouth daily.   Yes [provider]  budesonide-formoterol (SYMBICORT) 160-4.5 MCG/ACT inhaler Inhale 2 puffs into the lungs See admin instructions. Inhale 1 puff every morning, may inhale a 2nd puff in the evening if needed for wheezing   Yes [provider]  losartan (COZAAR) 100 MG tablet Take 100 mg by mouth daily.   Yes [provider]  Multiple Vitamin (MULTIVITAMIN WITH MINERALS) TABS tablet Take 1 tablet by mouth daily.   Yes [provider]  Multiple Vitamins-Minerals (ICAPS) CAPS Take 1 capsule by mouth daily.   Yes [provider]  Nebivolol HCl (BYSTOLIC) 20 MG TABS Take 1 tablet (20 mg total) by mouth daily. 10/24/15  Yes Allred, Jeneen Rinks, MD  nitroGLYCERIN (NITROSTAT) 0.4 MG SL tablet Place 0.4 mg under the tongue every 5 (five) minutes as needed for chest pain.   Yes [provider]  omeprazole (PRILOSEC) 20 MG capsule Take 20 mg by mouth daily.   Yes [provider]  potassium chloride (K-DUR) 10 MEQ tablet Take 10 mEq by mouth daily.  04/10/15  Yes [provider]  rivaroxaban (XARELTO) 20 MG TABS tablet Take 1 tablet (20 mg total) by mouth daily with supper. Patient taking differently: Take 20 mg by mouth daily with breakfast.  10/24/15  Yes Allred, Jeneen Rinks, MD  traMADol (ULTRAM) 50 MG tablet Take 50 mg by mouth 2 (two) times daily.   Yes [provider]    Current Facility-Administered Medications  Medication Dose Route Frequency Provider Last Rate Last Dose  . acetaminophen (TYLENOL) tablet 650 mg  650 mg Oral Q6H PRN Karmen Bongo, MD       Or  . acetaminophen (TYLENOL) suppository 650 mg  650 mg Rectal Q6H PRN Karmen Bongo, MD      . albuterol (PROVENTIL) (2.5 MG/3ML) 0.083% nebulizer solution 3 mL  3 mL Inhalation Q6H PRN Karmen Bongo, MD      . Derrill Memo ON 02/02/2018] amLODipine (NORVASC) tablet 10 mg  10 mg Oral Daily Karmen Bongo, MD      . Derrill Memo ON 02/02/2018] losartan (COZAAR) tablet 100 mg  100 mg Oral Daily Karmen Bongo, MD      . mometasone-formoterol [facility]) 200-5 MCG/ACT inhaler 2 puff  2 puff Inhalation BID Karmen Bongo, MD      . morphine 2 MG/ML injection 2 mg  2 mg Intravenous Q2H PRN Karmen Bongo, MD      . Nebivolol HCl TABS 20 mg  20 mg Oral Daily Karmen Bongo, MD      . ondansetron [facility]) tablet 4 mg  4 mg Oral Q6H PRN Karmen Bongo, MD       Or  . ondansetron [facility]) injection 4 mg  4 mg Intravenous Q6H PRN Karmen Bongo, MD      . traMADol [facility]) tablet 50 mg  50 mg Oral BID Karmen Bongo, MD       Current  Outpatient Medications  Medication Sig Dispense Refill  . acetaminophen (TYLENOL) 500 MG tablet Take 1,000 mg by mouth 2 (two) times daily.    Marland Kitchen albuterol (PROVENTIL HFA;VENTOLIN HFA) 108 (90 BASE) MCG/ACT inhaler Inhale 1 puff into the lungs every 6 (six) hours as needed for wheezing. Reported on 04/10/2015    . amLODipine (NORVASC) 10 MG tablet Take 10 mg by mouth daily.    . budesonide-formoterol (SYMBICORT) 160-4.5 MCG/ACT inhaler Inhale 2 puffs into the lungs See admin instructions. Inhale 1 puff every  morning, may inhale a 2nd puff in the evening if needed for wheezing    . losartan (COZAAR) 100 MG tablet Take 100 mg by mouth daily.    . Multiple Vitamin (MULTIVITAMIN WITH MINERALS) TABS tablet Take 1 tablet by mouth daily.    . Multiple Vitamins-Minerals (ICAPS) CAPS Take 1 capsule by mouth daily.    . Nebivolol HCl (BYSTOLIC) 20 MG TABS Take 1 tablet (20 mg total) by mouth daily. 30 tablet 11  . nitroGLYCERIN (NITROSTAT) 0.4 MG SL tablet Place 0.4 mg under the tongue every 5 (five) minutes as needed for chest pain.    Marland Kitchen omeprazole (PRILOSEC) 20 MG capsule Take 20 mg by mouth daily.    . potassium chloride (K-DUR) 10 MEQ tablet Take 10 mEq by mouth daily.     . rivaroxaban (XARELTO) 20 MG TABS tablet Take 1 tablet (20 mg total) by mouth daily with supper. (Patient taking differently: Take 20 mg by mouth daily with breakfast. ) 30 tablet 11  . traMADol (ULTRAM) 50 MG tablet Take 50 mg by mouth 2 (two) times daily.      Allergies as of 02/01/2018 - Review Complete 02/01/2018  Allergen Reaction Noted  . Methocarbamol Hives 03/14/2011  . Vicodin [hydrocodone-acetaminophen] Other (See Comments) 03/14/2011  . Aspirin Nausea And Vomiting 03/14/2011  . Butazolidin [phenylbutazone] Other (See Comments) 04/23/2011  . Celebrex [celecoxib] Nausea And Vomiting 04/23/2011  . Codeine Nausea And Vomiting 03/14/2011  . Doripenem Rash 04/23/2011  . Aspirin buf(alhyd-mghyd-cacar) Nausea And Vomiting  04/23/2011  . Mucinex [guaifenesin er] Itching 05/23/2014  . Temazepam Other (See Comments) 04/23/2011    Family History  Problem Relation Age of Onset  . Diabetes Father        Father, Mother, 4 sisters (2 living)  . Heart attack Father        (Deceased)  . Heart failure Father        (deceased 57)  . Hypertension Father   . Stroke Mother        (deceased 8)  . Hypertension Mother   . Cancer Sister 5       breast cancer  . Breast cancer Sister 25  . Heart attack Sister   . Diabetes Sister   . Cancer Other 26       niece with breast cancer    Social History   Socioeconomic History  . Marital status: Widowed    Spouse name: Not on file  . Number of children: Not on file  . Years of education: Not on file  . Highest education level: Not on file  Occupational History  . Occupation: Retired  Scientific laboratory technician  . Financial resource strain: Not on file  . Food insecurity:    Worry: Not on file    Inability: Not on file  . Transportation needs:    Medical: Not on file    Non-medical: Not on file  Tobacco Use  . Smoking status: Former Smoker    Packs/day: 1.00    Years: 21.00    Pack years: 21.00    Types: Cigarettes    Last attempt to quit: 08/15/1970    Years since quitting: 47.4  . Smokeless tobacco: Never Used  Substance and Sexual Activity  . Alcohol use: No    Alcohol/week: 0.0 standard drinks  . Drug use: No  . Sexual activity: Not Currently  Lifestyle  . Physical activity:    Days per week: Not on file    Minutes per session: Not  on file  . Stress: Not on file  Relationships  . Social connections:    Talks on phone: Not on file    Gets together: Not on file    Attends religious service: Not on file    Active member of club or organization: Not on file    Attends meetings of clubs or organizations: Not on file    Relationship status: Not on file  . Intimate partner violence:    Fear of current or ex partner: Not on file    Emotionally abused: Not on  file    Physically abused: Not on file    Forced sexual activity: Not on file  Other Topics Concern  . Not on file  Social History Narrative   Lives alone in [place].  Widowed in 2012 (husband w/ dementia & parkinsons)..   Not active, does not drive due to vision problems.    Review of Systems: Positive for: GI: Described in detail in HPI.    Gen: anorexia, Denies any fever, chills, rigors, night sweats, fatigue, weakness, malaise, involuntary weight loss, and sleep disorder CV: Denies chest pain, angina, palpitations, syncope, orthopnea, PND, peripheral edema, and claudication. Resp: Denies dyspnea, cough, sputum, wheezing, coughing up blood. GU : Denies urinary burning, blood in urine, urinary frequency, urinary hesitancy, nocturnal urination, and urinary incontinence. MS: loss of balance, right index finger- joint pain or swelling. Denies muscle weakness, cramps, atrophy.  Derm: Denies rash, itching, oral ulcerations, hives, unhealing ulcers.  Psych: Denies depression, anxiety, memory loss, suicidal ideation, hallucinations,  and confusion. Heme: Denies bruising, bleeding, and enlarged lymph nodes. Neuro:  Denies any headaches, dizziness, paresthesias. Endo:  Denies any problems with DM, thyroid, adrenal function.  Physical Exam: Vital signs in last 24 hours: Temp:  [99.3 F (37.4 C)] 99.3 F (37.4 C) (12/30 0541) Pulse Rate:  [50-86] 73 (12/30 1430) Resp:  [15-30] 23 (12/30 1430) BP: (117-169)/(54-133) 167/64 (12/30 1430) SpO2:  [93 %-100 %] 97 % (12/30 1430)    General:   Alert,  Well-developed, overweight, pleasant and cooperative in NAD Head:  Normocephalic and atraumatic. Eyes:  Icterus(difficult to appreciate with room lights).   Conjunctiva pink. Ears:  Normal auditory acuity. Nose:  No deformity, discharge,  or lesions. Mouth:  No deformity or lesions.  Oropharynx pink & moist. Neck:  Supple; no masses or thyromegaly. Lungs:  Clear throughout to auscultation.   No  wheezes, crackles, or rhonchi. No acute distress. Heart:  Regular rate and rhythm; no murmurs, clicks, rubs,  or gallops. Extremities: Right index finger deformity. Neurologic:  Alert and  oriented x4;  grossly normal neurologically. Skin:  Intact without significant lesions or rashes. Psych:  Alert and cooperative. Normal mood and affect. Abdomen:  Soft, nontender and nondistended. No masses, hepatosplenomegaly or hernias noted. Normal bowel sounds, without guarding, and without rebound.         Lab Results: Recent Labs    02/01/18 0615  WBC 10.8*  HGB 11.4*  HCT 33.6*  PLT 288   BMET Recent Labs    02/01/18 0712  NA 137  K 4.3  CL 104  CO2 21*  GLUCOSE 104*  BUN 21  CREATININE 1.09*  CALCIUM 8.8*   LFT Recent Labs    02/01/18 0712  PROT 5.8*  ALBUMIN 2.4*  AST 212*  ALT 243*  ALKPHOS 905*  BILITOT 13.6*   PT/INR No results for input(s): LABPROT, INR in the last 72 hours.  Studies/Results: Ct Head Wo Contrast  Result Date:  02/01/2018 CLINICAL DATA:  Fall.  No loss of consciousness. EXAM: CT HEAD WITHOUT CONTRAST CT CERVICAL SPINE WITHOUT CONTRAST TECHNIQUE: Multidetector CT imaging of the head and cervical spine was performed following the standard protocol without intravenous contrast. Multiplanar CT image reconstructions of the cervical spine were also generated. COMPARISON:  None. FINDINGS: CT HEAD FINDINGS Brain: Mild chronic ischemic white matter disease is noted. No mass effect or midline shift is noted. Ventricular size is within normal limits. There is no evidence of mass lesion, hemorrhage or acute infarction. Vascular: No hyperdense vessel or unexpected calcification. Skull: Normal. Negative for fracture or focal lesion. Sinuses/Orbits: Right maxillary mucous retention cyst is noted. Other: None. CT CERVICAL SPINE FINDINGS Alignment: Normal. Skull base and vertebrae: No acute fracture. No primary bone lesion or focal pathologic process. Soft tissues and  spinal canal: No prevertebral fluid or swelling. No visible canal hematoma. Disc levels:  Status post surgical anterior fusion of C5-6 and C6-7. Upper chest: Negative. Other: Degenerative changes are seen involving posterior facet joints bilaterally. IMPRESSION: Mild chronic ischemic white matter disease. No acute intracranial abnormality seen. Postsurgical and degenerative changes are noted in the cervical spine as described above. No acute fracture or spondylolisthesis is noted. Electronically Signed   By: Marijo Conception, M.D.   On: 02/01/2018 07:21   Ct Cervical Spine Wo Contrast  Result Date: 02/01/2018 CLINICAL DATA:  Fall.  No loss of consciousness. EXAM: CT HEAD WITHOUT CONTRAST CT CERVICAL SPINE WITHOUT CONTRAST TECHNIQUE: Multidetector CT imaging of the head and cervical spine was performed following the standard protocol without intravenous contrast. Multiplanar CT image reconstructions of the cervical spine were also generated. COMPARISON:  None. FINDINGS: CT HEAD FINDINGS Brain: Mild chronic ischemic white matter disease is noted. No mass effect or midline shift is noted. Ventricular size is within normal limits. There is no evidence of mass lesion, hemorrhage or acute infarction. Vascular: No hyperdense vessel or unexpected calcification. Skull: Normal. Negative for fracture or focal lesion. Sinuses/Orbits: Right maxillary mucous retention cyst is noted. Other: None. CT CERVICAL SPINE FINDINGS Alignment: Normal. Skull base and vertebrae: No acute fracture. No primary bone lesion or focal pathologic process. Soft tissues and spinal canal: No prevertebral fluid or swelling. No visible canal hematoma. Disc levels:  Status post surgical anterior fusion of C5-6 and C6-7. Upper chest: Negative. Other: Degenerative changes are seen involving posterior facet joints bilaterally. IMPRESSION: Mild chronic ischemic white matter disease. No acute intracranial abnormality seen. Postsurgical and degenerative  changes are noted in the cervical spine as described above. No acute fracture or spondylolisthesis is noted. Electronically Signed   By: Marijo Conception, M.D.   On: 02/01/2018 07:21   Ct Abdomen Pelvis W Contrast  Result Date: 02/01/2018 CLINICAL DATA:  84 year old female with history of jaundice. Dark urine. EXAM: CT ABDOMEN AND PELVIS WITH CONTRAST TECHNIQUE: Multidetector CT imaging of the abdomen and pelvis was performed using the standard protocol following bolus administration of intravenous contrast. CONTRAST:  118mL OMNIPAQUE IOHEXOL 300 MG/ML  SOLN COMPARISON:  No priors. FINDINGS: Lower chest: Aortic atherosclerosis. Atherosclerotic calcifications in the right coronary artery. Hepatobiliary: In the left lobe of the liver there is a central lesion that is poorly defined but estimated to measure approximately 5.5 x 3.3 x 3.6 cm (axial image 18 of series 3 and coronal image 51 of series 6). This is associated with severe intrahepatic biliary ductal dilatation, which is most severe throughout segments 2 and 3, but involves all areas of the liver. Status  post cholecystectomy. Common bile duct is not dilated. Pancreas: No pancreatic mass. No pancreatic ductal dilatation. No pancreatic or peripancreatic fluid or inflammatory changes. Spleen: Unremarkable. Adrenals/Urinary Tract: 1.4 cm with simple cyst in the lower pole of the right kidney. Left kidney is normal in appearance. No hydroureteronephrosis. Urinary bladder is normal in appearance. Bilateral adrenal glands are normal in appearance. Stomach/Bowel: Normal appearance of the stomach. No pathologic dilatation of small bowel or colon. Normal appendix. Vascular/Lymphatic: Aortic atherosclerosis, without evidence of aneurysm or dissection in the abdominal or pelvic vasculature. No lymphadenopathy noted in the abdomen or pelvis. Reproductive: Status post hysterectomy. Ovaries are not confidently identified and may be surgically absent or atrophic. Other:  No significant volume of ascites.  No pneumoperitoneum. Musculoskeletal: Status post right hip arthroplasty. There are no aggressive appearing lytic or blastic lesions noted in the visualized portions of the skeleton. IMPRESSION: 1. Ill-defined central hepatic mass in the left lobe of the liver estimated to measure approximately 5.5 x 3.3 x 3.6 cm with associated severe intrahepatic biliary ductal dilatation. Findings are highly concerning for cholangiocarcinoma. 2. Aortic atherosclerosis, in addition to at least right coronary artery disease. 3. Additional incidental findings, as above. Electronically Signed   By: Vinnie Langton M.D.   On: 02/01/2018 14:53   Dg Hand Complete Right  Result Date: 02/01/2018 CLINICAL DATA:  Status post dislocation at the PIP joint of the index finger of the right hand. Post reduction views. EXAM: RIGHT HAND - COMPLETE 3+ VIEW COMPARISON:  Prevertebral in image of today's date at 6:29 a.m. FINDINGS: The previously dislocated PIP joint is now in normal alignment. There is a small bony density adjacent to the dorso-ulnar aspect of the head of the proximal phalanx of the index finger which may reflect an avulsion fracture. The PIP joint space is well maintained. There is severe osteoarthritic joint space loss with surrounding proliferative changes involving the DIP joint of the second as well as third, fourth, and fifth fingers. There are degenerative changes of the first [facility] joint. IMPRESSION: Successful relocation of the previously laterally dislocated index finger at the PIP joint. Possible small avulsion from the ulnar aspect of the head of the proximal phalanx. Significant DIP joint osteoarthritic change of the second through fifth digits and of the first [facility] joint. Electronically Signed   By: David  Martinique M.D.   On: 02/01/2018 08:01   Dg Hand Complete Right  Result Date: 02/01/2018 CLINICAL DATA:  Status post fall, hitting right hand on tub, with deformity of the right  index finger. Initial encounter. EXAM: RIGHT HAND - COMPLETE 3+ VIEW COMPARISON:  None. FINDINGS: There is dislocation at the second proximal interphalangeal joint, with ulnar and dorsal angulation. No definite fracture is characterized. Degenerative change at the first carpometacarpal joint, and at the distal interphalangeal joints, raises concern for osteoarthritis. No definite soft tissue abnormalities are characterized on radiograph. Negative ulnar variance is noted. IMPRESSION: Dislocation at the second proximal interphalangeal joint, with ulnar and dorsal angulation. No definite fracture seen. Electronically Signed   By: Garald Balding M.D.   On: 02/01/2018 06:49   Dg Hips Bilat W Or Wo Pelvis 2 Views  Result Date: 02/01/2018 CLINICAL DATA:  Status post fall, with concern for pelvic injury. Initial encounter. EXAM: DG HIP (WITH OR WITHOUT PELVIS) 2V BILAT COMPARISON:  None. FINDINGS: There is no evidence of fracture or dislocation. The patient's right hip arthroplasty is grossly unremarkable in appearance, without evidence of significant loosening. The proximal left femur appears intact.  Mild degenerative change is noted at the lower lumbar spine. The sacroiliac joints are unremarkable in appearance. The visualized bowel gas pattern is grossly unremarkable in appearance. Scattered phleboliths are noted within the pelvis. IMPRESSION: No evidence of fracture or dislocation. Right hip arthroplasty is grossly unremarkable in appearance. Electronically Signed   By: Garald Balding M.D.   On: 02/01/2018 06:51    Impression: Obstructive jaundice-painless, due to poorly defined [id number] cm left lobe liver lesion associated with severe intrahepatic biliary ductal dilatation, most severe throughout segments 2 and 3 but involves all areas of the liver and common bile duct is NOT dilated,findings concerning for cholangiocarcinoma TB 13.6, AST 212/AST 243/ALP 905 No leukocytosis, no fever, no abdominal  pain.  Renal insufficiency,BUN 21/creatinine 1.09/GFR 47  Normocytic anemia, hemoglobin 11.4  Plan: IR evaluation for liver biopsy and possible stenting of intrahepatic bile duct causing obstructive jaundice. As CBD is of normal caliber, ERCP is not indicated Discussed the same with patient and family members at bedside along with admitting hospitalist Dr. Lorin Mercy. Patient reports last dose of Xarelto was yesterday morning. Will send CEA, CA-19-9 and alpha-fetoprotein levels. Patient will benefit from oncology evaluation post-liver biopsy.   LOS: 0 days   Ronnette Juniper, M.D.  02/01/2018, 4:13 PM  Pager [phone] If no answer or after 5 PM call [phone]

## 2018-02-01 NOTE — ED Notes (Signed)
Patient transported to X-ray & CT °

## 2018-02-01 NOTE — ED Notes (Signed)
Pt assisted (2-person staff) to a bedside commode.

## 2018-02-01 NOTE — Progress Notes (Signed)
IR aware of request for biliary stent + biopsy. Incidental findings of hepatic mass and biliary ductal dilatation were reviewed by Dr. Laurence Ferrari. At time of this writing there are not consult notes in the chart available for further review.  If patient is not considered a candidate for intervention with gastroenterology, patient is approved for right and left biliary drain placement with brush biopsy tentatively PM of 12/31 or AM of 1/1. Will await GI consult.   Orders have been placed for INR, continue to hold Xarelto, NPO after midnight, no heparin SQ after midnight, no lovenox SQ until after procedure, may start heparin IV if needed for anticoagulation however this will need to be held 4 hours prior to procedure.  PA will obtain consent/perform full consult 12/31 if indicated.   Please call IR with questions or concerns.   Candiss Norse, PA-C 02/01/2018 [phone]

## 2018-02-02 ENCOUNTER — Encounter (HOSPITAL_COMMUNITY): Payer: Self-pay | Admitting: *Deleted

## 2018-02-02 ENCOUNTER — Inpatient Hospital Stay (HOSPITAL_COMMUNITY): Payer: PPO

## 2018-02-02 DIAGNOSIS — I4891 Unspecified atrial fibrillation: Secondary | ICD-10-CM

## 2018-02-02 HISTORY — PX: IR INT EXT BILIARY DRAIN WITH CHOLANGIOGRAM: IMG6044

## 2018-02-02 LAB — BASIC METABOLIC PANEL
Anion gap: 14 (ref 5–15)
BUN: 20 mg/dL (ref 8–23)
CO2: 19 mmol/L — ABNORMAL LOW (ref 22–32)
CREATININE: 1.04 mg/dL — AB (ref 0.44–1.00)
Calcium: 9 mg/dL (ref 8.9–10.3)
Chloride: 101 mmol/L (ref 98–111)
GFR calc Af Amer: 58 mL/min — ABNORMAL LOW (ref >60)
GFR calc non Af Amer: 50 mL/min — ABNORMAL LOW (ref >60)
GLUCOSE: 90 mg/dL (ref 70–99)
Potassium: 5.1 mmol/L (ref 3.5–5.1)
Sodium: 134 mmol/L — ABNORMAL LOW (ref 135–145)

## 2018-02-02 LAB — MAGNESIUM: MAGNESIUM: 1.8 mg/dL (ref 1.7–2.4)

## 2018-02-02 LAB — CEA: CEA: 4 ng/mL (ref 0.0–4.7)

## 2018-02-02 LAB — CBC
HCT: 32.5 % — ABNORMAL LOW (ref 36.0–46.0)
Hemoglobin: 10.7 g/dL — ABNORMAL LOW (ref 12.0–15.0)
MCH: 27.9 pg (ref 26.0–34.0)
MCHC: 32.9 g/dL (ref 30.0–36.0)
MCV: 84.9 fL (ref 80.0–100.0)
PLATELETS: 343 10*3/uL (ref 150–400)
RBC: 3.83 MIL/uL — AB (ref 3.87–5.11)
RDW: 21.8 % — ABNORMAL HIGH (ref 11.5–15.5)
WBC: 10.8 10*3/uL — ABNORMAL HIGH (ref 4.0–10.5)
nRBC: 0 % (ref 0.0–0.2)

## 2018-02-02 LAB — AFP TUMOR MARKER: AFP, Serum, Tumor Marker: 6.5 ng/mL (ref 0.0–8.3)

## 2018-02-02 LAB — HEPATIC FUNCTION PANEL
ALT: 260 U/L — ABNORMAL HIGH (ref 0–44)
AST: 249 U/L — ABNORMAL HIGH (ref 15–41)
Albumin: 2.5 g/dL — ABNORMAL LOW (ref 3.5–5.0)
Alkaline Phosphatase: 1098 U/L — ABNORMAL HIGH (ref 38–126)
Bilirubin, Direct: 11.9 mg/dL — ABNORMAL HIGH (ref 0.0–0.2)
Indirect Bilirubin: 4.6 mg/dL — ABNORMAL HIGH (ref 0.3–0.9)
TOTAL PROTEIN: 5.4 g/dL — AB (ref 6.5–8.1)
Total Bilirubin: 16.5 mg/dL — ABNORMAL HIGH (ref 0.3–1.2)

## 2018-02-02 LAB — GRAM STAIN

## 2018-02-02 LAB — CA 125: Cancer Antigen (CA) 125: 67.6 U/mL — ABNORMAL HIGH (ref 0.0–38.1)

## 2018-02-02 LAB — CANCER ANTIGEN 19-9: CA 19-9: 7571 U/mL — ABNORMAL HIGH (ref 0–35)

## 2018-02-02 LAB — PHOSPHORUS: Phosphorus: 5.5 mg/dL — ABNORMAL HIGH (ref 2.5–4.6)

## 2018-02-02 MED ORDER — MIDAZOLAM HCL 2 MG/2ML IJ SOLN
INTRAMUSCULAR | Status: AC
  Filled 2018-02-02: qty 2

## 2018-02-02 MED ORDER — PIPERACILLIN-TAZOBACTAM 3.375 G IVPB 30 MIN
3.3750 g | Freq: Once | INTRAVENOUS | Status: DC
  Filled 2018-02-02 (×2): qty 50

## 2018-02-02 MED ORDER — PROMETHAZINE HCL 25 MG/ML IJ SOLN
12.5000 mg | Freq: Four times a day (QID) | INTRAMUSCULAR | Status: DC | PRN
  Administered 2018-02-02 – 2018-02-12 (×3): 12.5 mg via INTRAVENOUS
  Filled 2018-02-02 (×3): qty 1

## 2018-02-02 MED ORDER — SODIUM CHLORIDE 0.9 % IV SOLN
INTRAVENOUS | Status: AC | PRN
  Administered 2018-02-02: 10 mL/h via INTRAVENOUS

## 2018-02-02 MED ORDER — FENTANYL CITRATE (PF) 100 MCG/2ML IJ SOLN
INTRAMUSCULAR | Status: AC | PRN
  Administered 2018-02-02 (×2): 50 ug via INTRAVENOUS
  Administered 2018-02-02: 100 ug via INTRAVENOUS

## 2018-02-02 MED ORDER — IOPAMIDOL (ISOVUE-300) INJECTION 61%
INTRAVENOUS | Status: AC
  Administered 2018-02-02: 5 mL
  Filled 2018-02-02: qty 50

## 2018-02-02 MED ORDER — LIDOCAINE HCL 1 % IJ SOLN
INTRAMUSCULAR | Status: AC
  Filled 2018-02-02: qty 20

## 2018-02-02 MED ORDER — IOPAMIDOL (ISOVUE-300) INJECTION 61%
INTRAVENOUS | Status: AC
  Administered 2018-02-02: 35 mL
  Filled 2018-02-02: qty 50

## 2018-02-02 MED ORDER — HEPARIN (PORCINE) 25000 UT/250ML-% IV SOLN
1900.0000 [IU]/h | INTRAVENOUS | Status: DC
  Administered 2018-02-02: 1200 [IU]/h via INTRAVENOUS
  Administered 2018-02-03: 1350 [IU]/h via INTRAVENOUS
  Filled 2018-02-02 (×3): qty 250

## 2018-02-02 MED ORDER — ENSURE ENLIVE PO LIQD
237.0000 mL | Freq: Two times a day (BID) | ORAL | Status: DC
  Administered 2018-02-03 – 2018-02-09 (×8): 237 mL via ORAL

## 2018-02-02 MED ORDER — MIDAZOLAM HCL 2 MG/2ML IJ SOLN
INTRAMUSCULAR | Status: AC | PRN
  Administered 2018-02-02 (×2): 1 mg via INTRAVENOUS

## 2018-02-02 MED ORDER — ONDANSETRON HCL 4 MG/2ML IJ SOLN
INTRAMUSCULAR | Status: AC
  Filled 2018-02-02: qty 2

## 2018-02-02 MED ORDER — FENTANYL CITRATE (PF) 100 MCG/2ML IJ SOLN
INTRAMUSCULAR | Status: AC
  Filled 2018-02-02: qty 2

## 2018-02-02 MED ORDER — ONDANSETRON HCL 4 MG/2ML IJ SOLN
INTRAMUSCULAR | Status: AC | PRN
  Administered 2018-02-02: 4 mg via INTRAVENOUS

## 2018-02-02 MED ORDER — LIDOCAINE HCL (PF) 1 % IJ SOLN
INTRAMUSCULAR | Status: AC | PRN
  Administered 2018-02-02: 20 mL

## 2018-02-02 MED ORDER — PIPERACILLIN-TAZOBACTAM 3.375 G IVPB 30 MIN
INTRAVENOUS | Status: AC | PRN
  Administered 2018-02-02: 3.375 g via INTRAVENOUS

## 2018-02-02 NOTE — Progress Notes (Signed)
Initial Nutrition Assessment  DOCUMENTATION CODES:   Obesity unspecified  INTERVENTION:   Ensure Enlive po BID, each supplement provides 350 kcal and 20 grams of protein  NUTRITION DIAGNOSIS:   Inadequate oral intake related to nausea, poor appetite as evidenced by per patient/family report.  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 PMH includes HTN, CHF, remote breast cance   12/31 PTC and placement of internal/external biliary drain   Pt has not been eating well since before Thanksgiving, complains of nausea. Recorded po intake 0%. FL diet ordered for lunch today  Current wt 109.8 kg; weight of 113.4 kg 6 months ago (3% wt loss). Pt weighed 105 kg >2 years ago  Labs: sodium 134, phosphorus 5.5, Creatinine 1.04, BUN wdl  Meds: reviewed   NUTRITION - FOCUSED PHYSICAL EXAM:  Unable to perform  Diet Order:   Diet Order            Diet full liquid Room service appropriate? Yes; Fluid consistency: Thin  Diet effective now              EDUCATION NEEDS:   No education needs have been identified at this time  Skin:  Skin Assessment: Reviewed RN Assessment  Last BM:  12/31  Height:   Ht Readings from Last 1 Encounters:  02/02/18 '5\' 6"'  (1.676 m)    Weight:   Wt Readings from Last 1 Encounters:  02/02/18 109.8 kg    Ideal Body Weight:  59 kg  BMI:  Body mass index is 39.06 kg/m.  Estimated Nutritional Needs:   Kcal:  2000-2200 kcals   Protein:  110-120 g   Fluid:  >/= 2 L    Kerman Passey MS, RD, LDN, CNSC [phone] Pager  [phone] Weekend/On-Call Pager

## 2018-02-02 NOTE — Sedation Documentation (Signed)
Pt complaining of nausea, administered 4mg  zofran iv, will continue to monitor closely

## 2018-02-02 NOTE — Progress Notes (Signed)
PROGRESS NOTE    Jordan Byrd  NLG:[id number] DOB: 27-Jul-1933 DOA: 02/01/2018 PCP: Levin Erp, MD   Brief Narrative:  HPI per Dr. Karmen Bongo on 02/01/18 Jordan Byrd is a 84 y.o. female with medical history significant of HTN; CHF; remote breast cancer; and afib on Xarelto presenting with a fall.  She came in for a fall in the bathroom at 0400.  She fell because she doesn't get around very well.  She has been losing her balance a lot since Thanksgiving and her knee/ankle have been giving out.  Not light-headed or dizzy.  She just loses her balance and falls.  She has not been feeling well since before Thanksgiving.  She is nauseated, difficulty eating, can't do anything, "I mean I'm just sick all over."  She has not been vomiting.  She eats a few bites and feels nauseated and can't continue to eat.  She has been having GERD at night.  They are concerned about weight loss, but she denies weight loss.  No night sweats.  No LAD.  No fevers.  No pain other than her "stomach, it rolls" when she is sick.  Grandson's wife noticed she is yellow but she has not noticed any color change.  Her urine has been "brown, almost" and so they sent a specimen to the office and they ordered lab work for her, not done yet.  ED Course:  Came in for a fall.  Frequent falls, no dizziness or other symptoms prior.  Finger dislocation, relocated and splint in place.  Found to be quite jaundiced.  Bili 13.6.  Has not had imaging to determine etiology for jaundice.  **Percutaneous placement of internal/external biliary drain on right lobe and there is unable to get good access from the left hepatic ducts is now placed.  Have placed on empiric heparin drip given unclear plan for biopsy.  Assessment & Plan:   Principal Problem:   Obstructive jaundice Active Problems:   Hypertension   CKD (chronic kidney disease) stage 3, GFR 30-59 ml/min (HCC)   Chronic diastolic heart failure (HCC)   Atrial fibrillation (HCC)  Falls frequently  Obstructive Jaundice abnormal LFTs, alk phos, as well as bilirubin -Patient presenting with recent frequent falls -Was noted to have marked jaundice on exam -Subsequently noted to have elevated LFTs with bili 13.6 and now was 16.5 with Direct Bilirubin 11.9 and Indirect 4.6 -Since it was not clear where the patient would need to be admitted [facility]) or even if she needed to be admitted, CT ordered for further evaluation -CT Abdomen shows a baseball-sized mass in the left lobe of the liver, highly suspicious for cholangiocarcinoma -GI has been consulted, Dr. Therisa Doyne to see and is IR evaluation for liver biopsy and possible stenting of the intrahepatic bile duct causing obstructive jaundice.  ERCP is not indicated as the common bile duct was of normal caliber -GI sent CEA/CA-19-9/AFP levels -AFP was 6.5, CA-19-9 was 7571, and CA 125 was 67.6 and CEA was 4.0 -IR consult has been requested and she went under.  Percutaneous placement of an internal/external biliary drain on the right lobe and unable to get good access from the left hepatic ducts.  Is unclear if the data brush biopsy or biopsy of the liver currently but fluid was sent for cytology and culture -NPO advance to full liquid diet and will defer to gastroenterology to advance further -Hold Xarelto have empirically started patient on a heparin drip -Pain control with morphine and tramadol  if needed; nausea control with Zofran -Continue monitor laboratory data and repeat MP in the a.m. -Per IR she will need a follow-up CT or MRI in a few days to reassess hepatic anatomy I will continue to follow LFTs  Falls -Suspect that falls are related to underlying malignancy and inability of patient to take PO -Have advance diet to full liquid diet -Will follow -She may benefit from PT evaluation once her trajectory is clarified but will hold off for now -She lives alone with her grandson next door; she may need to consider  short-term placement  Afib on Xarelto -Rate controlled with Nebivolol 20 mg p.o. daily -Hold Xarelto currently given ? Biopsy; S/p Drain Placement on the Right  -Will empirically start on Heparin gtt until no more current interventions   Chronic Diastolic CHF -Grade 2 Diastolic Function on 2706 echo -Appears to be compensated at this time -Continue to Monitor for signs and symptoms of volume overload  Stage 3 CKD -Appears to be stable at this time, despite report of early satiety and Anorexia -Patient's BUN/creatinine 1 from 21/1.09 is now 20/1.04 -Continue to Monitor   HTN -Continue home meds with Amlodipine 10 mg po Daily, Losartan 100 mg po daily, and Nebivolol 20 mg po Daily  Nausea -C/w Ondansetron and IV Phenergan   Obesity -Estimated body mass index is 39.06 kg/m as calculated from the following:   Height as of this encounter: '5\' 6"'  (1.676 m).   Weight as of this encounter: 109.8 kg. -Weight Loss Counseling Given   DVT prophylaxis: Anticoagulated with Heparin gtt Code Status: DO NOT RESUSCITATE Family Communication: Discussed with Family at bedside  Disposition Plan: Remain Inpatient for further workup and evaluation; Pending IR and GI Clearance   Consultants:   Gastroenterology  Interventional Radiology    Procedures:  PTC and placement of internal/external biliary drain  Findings: Severe intrahepatic biliary dilatation.  Central biliary obstruction.  10 Fr internal / external drain placed from right lobe.  Unable to get good access from left hepatic ducts.     Antimicrobials:  Anti-infectives (From admission, onward)   Start     Dose/Rate Route Frequency Ordered Stop   02/02/18 0900  piperacillin-tazobactam (ZOSYN) IVPB 3.375 g     3.375 g 100 mL/hr over 30 Minutes Intravenous  Once 02/02/18 0838     02/02/18 0839  piperacillin-tazobactam (ZOSYN) IVPB     over 30 Minutes  Continuous PRN 02/02/18 0839 02/02/18 0839     Subjective: Seen and  examined at bedside she is doing okay.  Denies any chest pain, lightheadedness or dizziness.  But wanted to take her finger splint off and I advised against it given that she needs to follow-up with orthopedics.  No chest pain, lightheadedness or dizziness.  After procedure she became nauseous and was not amenable to Zofran so Phenergan was ordered.  Objective: Vitals:   02/02/18 1120 02/02/18 1206 02/02/18 1218 02/02/18 1619  BP: (!) 143/68 (!) 151/65 (!) 151/65 (!) 148/55  Pulse: 69 76 76 70  Resp: 20 (!) 22 (!) 22 (!) 24  Temp:  98.8 F (37.1 C) 98.8 F (37.1 C) 97.8 F (36.6 C)  TempSrc:  Oral Oral Oral  SpO2: 97% 95%  94%  Weight:   109.8 kg   Height:   '5\' 6"'  (1.676 m)     Intake/Output Summary (Last 24 hours) at 02/02/2018 1802 Last data filed at 02/02/2018 1554 Gross per 24 hour  Intake 720 ml  Output 775 ml  Net -55 ml   Filed Weights   02/02/18 1218  Weight: 109.8 kg   Examination: Physical Exam:  Constitutional: WN/WD obese Caucasian Jaundiced female in NAD and appears calm  Eyes: Lids and conjunctivae normal, sclerae anicteric  ENMT: External Ears, Nose appear normal. Grossly normal hearing. Mucous membranes are moist.  Neck: Appears normal, supple, no cervical masses, normal ROM, no appreciable thyromegaly, no JVD Respiratory: Diminished to auscultation bilaterally, no wheezing, rales, rhonchi or crackles. Normal respiratory effort and patient is not tachypenic. No accessory muscle use.  Cardiovascular: RRR, no murmurs / rubs / gallops. S1 and S2 auscultated.  Abdomen: Soft, non-tender, non-distended. No masses palpated. No appreciable hepatosplenomegaly. Bowel sounds positive x4.  GU: Deferred. Musculoskeletal: No clubbing / cyanosis of digits/nails. Good ROM, no contractures. Normal strength and muscle tone.  Skin: Jaundiced. No induration; Warm and dry.  Neurologic: CN 2-12 grossly intact with no focal deficits.  Romberg sign and cerebellar reflexes not  assessed.  Psychiatric: Normal judgment and insight. Alert and oriented x 3. Normal mood and appropriate affect.   Data Reviewed: I have personally reviewed following labs and imaging studies  CBC: Recent Labs  Lab 02/01/18 0615 02/02/18 1310  WBC 10.8* 10.8*  NEUTROABS 9.3*  --   HGB 11.4* 10.7*  HCT 33.6* 32.5*  MCV 85.5 84.9  PLT 288 017   Basic Metabolic Panel: Recent Labs  Lab 02/01/18 0712 02/02/18 0737  NA 137 134*  K 4.3 5.1  CL 104 101  CO2 21* 19*  GLUCOSE 104* 90  BUN 21 20  CREATININE 1.09* 1.04*  CALCIUM 8.8* 9.0  MG  --  1.8  PHOS  --  5.5*   GFR: Estimated Creatinine Clearance: 52.3 mL/min (A) (by C-G formula based on SCr of 1.04 mg/dL (H)). Liver Function Tests: Recent Labs  Lab 02/01/18 0712 02/02/18 0737  AST 212* 249*  ALT 243* 260*  ALKPHOS 905* 1,098*  BILITOT 13.6* 16.5*  PROT 5.8* 5.4*  ALBUMIN 2.4* 2.5*   Recent Labs  Lab 02/01/18 0712  LIPASE 19   No results for input(s): AMMONIA in the last 168 hours. Coagulation Profile: Recent Labs  Lab 02/01/18 1712  INR 1.16   Cardiac Enzymes: No results for input(s): CKTOTAL, CKMB, CKMBINDEX, TROPONINI in the last 168 hours. BNP (last 3 results) No results for input(s): PROBNP in the last 8760 hours. HbA1C: No results for input(s): HGBA1C in the last 72 hours. CBG: No results for input(s): GLUCAP in the last 168 hours. Lipid Profile: No results for input(s): CHOL, HDL, LDLCALC, TRIG, CHOLHDL, LDLDIRECT in the last 72 hours. Thyroid Function Tests: No results for input(s): TSH, T4TOTAL, FREET4, T3FREE, THYROIDAB in the last 72 hours. Anemia Panel: No results for input(s): VITAMINB12, FOLATE, FERRITIN, TIBC, IRON, RETICCTPCT in the last 72 hours. Sepsis Labs: No results for input(s): PROCALCITON, LATICACIDVEN in the last 168 hours.  Recent Results (from the past 240 hour(s))  Urine culture     Status: Abnormal (Preliminary result)   Collection Time: 02/01/18  9:46 AM  Result  Value Ref Range Status   Specimen Description URINE, RANDOM  Final   Special Requests   Final    NONE Performed at [facility], [address] [place], [place] [address]    Culture 50,000 COLONIES/mL ESCHERICHIA COLI (A)  Final   Report Status PENDING  Incomplete    Radiology Studies: Ct Head Wo Contrast  Result Date: 02/01/2018 CLINICAL DATA:  Fall.  No loss of consciousness. EXAM:  CT HEAD WITHOUT CONTRAST CT CERVICAL SPINE WITHOUT CONTRAST TECHNIQUE: Multidetector CT imaging of the head and cervical spine was performed following the standard protocol without intravenous contrast. Multiplanar CT image reconstructions of the cervical spine were also generated. COMPARISON:  None. FINDINGS: CT HEAD FINDINGS Brain: Mild chronic ischemic white matter disease is noted. No mass effect or midline shift is noted. Ventricular size is within normal limits. There is no evidence of mass lesion, hemorrhage or acute infarction. Vascular: No hyperdense vessel or unexpected calcification. Skull: Normal. Negative for fracture or focal lesion. Sinuses/Orbits: Right maxillary mucous retention cyst is noted. Other: None. CT CERVICAL SPINE FINDINGS Alignment: Normal. Skull base and vertebrae: No acute fracture. No primary bone lesion or focal pathologic process. Soft tissues and spinal canal: No prevertebral fluid or swelling. No visible canal hematoma. Disc levels:  Status post surgical anterior fusion of C5-6 and C6-7. Upper chest: Negative. Other: Degenerative changes are seen involving posterior facet joints bilaterally. IMPRESSION: Mild chronic ischemic white matter disease. No acute intracranial abnormality seen. Postsurgical and degenerative changes are noted in the cervical spine as described above. No acute fracture or spondylolisthesis is noted. Electronically Signed   By: Marijo Conception, M.D.   On: 02/01/2018 07:21   Ct Cervical Spine Wo Contrast  Result Date: 02/01/2018 CLINICAL DATA:  Fall.  No  loss of consciousness. EXAM: CT HEAD WITHOUT CONTRAST CT CERVICAL SPINE WITHOUT CONTRAST TECHNIQUE: Multidetector CT imaging of the head and cervical spine was performed following the standard protocol without intravenous contrast. Multiplanar CT image reconstructions of the cervical spine were also generated. COMPARISON:  None. FINDINGS: CT HEAD FINDINGS Brain: Mild chronic ischemic white matter disease is noted. No mass effect or midline shift is noted. Ventricular size is within normal limits. There is no evidence of mass lesion, hemorrhage or acute infarction. Vascular: No hyperdense vessel or unexpected calcification. Skull: Normal. Negative for fracture or focal lesion. Sinuses/Orbits: Right maxillary mucous retention cyst is noted. Other: None. CT CERVICAL SPINE FINDINGS Alignment: Normal. Skull base and vertebrae: No acute fracture. No primary bone lesion or focal pathologic process. Soft tissues and spinal canal: No prevertebral fluid or swelling. No visible canal hematoma. Disc levels:  Status post surgical anterior fusion of C5-6 and C6-7. Upper chest: Negative. Other: Degenerative changes are seen involving posterior facet joints bilaterally. IMPRESSION: Mild chronic ischemic white matter disease. No acute intracranial abnormality seen. Postsurgical and degenerative changes are noted in the cervical spine as described above. No acute fracture or spondylolisthesis is noted. Electronically Signed   By: Marijo Conception, M.D.   On: 02/01/2018 07:21   Ct Abdomen Pelvis W Contrast  Result Date: 02/01/2018 CLINICAL DATA:  85 year old female with history of jaundice. Dark urine. EXAM: CT ABDOMEN AND PELVIS WITH CONTRAST TECHNIQUE: Multidetector CT imaging of the abdomen and pelvis was performed using the standard protocol following bolus administration of intravenous contrast. CONTRAST:  18m OMNIPAQUE IOHEXOL 300 MG/ML  SOLN COMPARISON:  No priors. FINDINGS: Lower chest: Aortic atherosclerosis.  Atherosclerotic calcifications in the right coronary artery. Hepatobiliary: In the left lobe of the liver there is a central lesion that is poorly defined but estimated to measure approximately 5.5 x 3.3 x 3.6 cm (axial image 18 of series 3 and coronal image 51 of series 6). This is associated with severe intrahepatic biliary ductal dilatation, which is most severe throughout segments 2 and 3, but involves all areas of the liver. Status post cholecystectomy. Common bile duct is not dilated. Pancreas: No pancreatic  mass. No pancreatic ductal dilatation. No pancreatic or peripancreatic fluid or inflammatory changes. Spleen: Unremarkable. Adrenals/Urinary Tract: 1.4 cm with simple cyst in the lower pole of the right kidney. Left kidney is normal in appearance. No hydroureteronephrosis. Urinary bladder is normal in appearance. Bilateral adrenal glands are normal in appearance. Stomach/Bowel: Normal appearance of the stomach. No pathologic dilatation of small bowel or colon. Normal appendix. Vascular/Lymphatic: Aortic atherosclerosis, without evidence of aneurysm or dissection in the abdominal or pelvic vasculature. No lymphadenopathy noted in the abdomen or pelvis. Reproductive: Status post hysterectomy. Ovaries are not confidently identified and may be surgically absent or atrophic. Other: No significant volume of ascites.  No pneumoperitoneum. Musculoskeletal: Status post right hip arthroplasty. There are no aggressive appearing lytic or blastic lesions noted in the visualized portions of the skeleton. IMPRESSION: 1. Ill-defined central hepatic mass in the left lobe of the liver estimated to measure approximately 5.5 x 3.3 x 3.6 cm with associated severe intrahepatic biliary ductal dilatation. Findings are highly concerning for cholangiocarcinoma. 2. Aortic atherosclerosis, in addition to at least right coronary artery disease. 3. Additional incidental findings, as above. Electronically Signed   By: Vinnie Langton  M.D.   On: 02/01/2018 14:53   Dg Hand Complete Right  Result Date: 02/01/2018 CLINICAL DATA:  Status post dislocation at the PIP joint of the index finger of the right hand. Post reduction views. EXAM: RIGHT HAND - COMPLETE 3+ VIEW COMPARISON:  Prevertebral in image of today's date at 6:29 a.m. FINDINGS: The previously dislocated PIP joint is now in normal alignment. There is a small bony density adjacent to the dorso-ulnar aspect of the head of the proximal phalanx of the index finger which may reflect an avulsion fracture. The PIP joint space is well maintained. There is severe osteoarthritic joint space loss with surrounding proliferative changes involving the DIP joint of the second as well as third, fourth, and fifth fingers. There are degenerative changes of the first [facility] joint. IMPRESSION: Successful relocation of the previously laterally dislocated index finger at the PIP joint. Possible small avulsion from the ulnar aspect of the head of the proximal phalanx. Significant DIP joint osteoarthritic change of the second through fifth digits and of the first [facility] joint. Electronically Signed   By: David  Martinique M.D.   On: 02/01/2018 08:01   Dg Hand Complete Right  Result Date: 02/01/2018 CLINICAL DATA:  Status post fall, hitting right hand on tub, with deformity of the right index finger. Initial encounter. EXAM: RIGHT HAND - COMPLETE 3+ VIEW COMPARISON:  None. FINDINGS: There is dislocation at the second proximal interphalangeal joint, with ulnar and dorsal angulation. No definite fracture is characterized. Degenerative change at the first carpometacarpal joint, and at the distal interphalangeal joints, raises concern for osteoarthritis. No definite soft tissue abnormalities are characterized on radiograph. Negative ulnar variance is noted. IMPRESSION: Dislocation at the second proximal interphalangeal joint, with ulnar and dorsal angulation. No definite fracture seen. Electronically Signed   By:  Garald Balding M.D.   On: 02/01/2018 06:49   Dg Hips Bilat W Or Wo Pelvis 2 Views  Result Date: 02/01/2018 CLINICAL DATA:  Status post fall, with concern for pelvic injury. Initial encounter. EXAM: DG HIP (WITH OR WITHOUT PELVIS) 2V BILAT COMPARISON:  None. FINDINGS: There is no evidence of fracture or dislocation. The patient's right hip arthroplasty is grossly unremarkable in appearance, without evidence of significant loosening. The proximal left femur appears intact. Mild degenerative change is noted at the lower lumbar spine. The  sacroiliac joints are unremarkable in appearance. The visualized bowel gas pattern is grossly unremarkable in appearance. Scattered phleboliths are noted within the pelvis. IMPRESSION: No evidence of fracture or dislocation. Right hip arthroplasty is grossly unremarkable in appearance. Electronically Signed   By: Garald Balding M.D.   On: 02/01/2018 06:51   Ir Int Lianne Cure Biliary Drain With Cholangiogram  Result Date: 02/02/2018 INDICATION: 84 year old with obstructive jaundice. Plan for percutaneous transhepatic cholangiogram with biliary drain placement. EXAM: PERCUTANEOUS TRANSHEPATIC CHOLANGIOGRAM WITH ULTRASOUND AND FLUOROSCOPIC GUIDANCE PLACEMENT OF INTERNAL/EXTERNAL BILIARY DRAIN MEDICATIONS: Zosyn 3.375 g; The antibiotic was administered within an appropriate time frame prior to the initiation of the procedure. ANESTHESIA/SEDATION: Moderate (conscious) sedation was employed during this procedure. A total of Versed 2.0 mg and Fentanyl 200 mcg was administered intravenously. Moderate Sedation Time: 68 minutes. The patient's level of consciousness and vital signs were monitored continuously by radiology nursing throughout the procedure under my direct supervision. FLUOROSCOPY TIME:  Fluoroscopy Time: 11 minutes 42 seconds (125 mGy). CONTRAST:  40 mL [id number] COMPLICATIONS: None immediate. PROCEDURE: Informed written consent was obtained from the patient after a thorough  discussion of the procedural risks, benefits and alternatives. All questions were addressed. Maximal Sterile Barrier Technique was utilized including caps, mask, sterile gowns, sterile gloves, sterile drape, hand hygiene and skin antiseptic. A timeout was performed prior to the initiation of the procedure. Patient was supine on the interventional table. The anterior and right abdomen was prepped and draped in sterile fashion. Ultrasound demonstrated dilated intrahepatic bile ducts. Initially, attention was directed to the left hepatic ducts. Skin was anesthetized with 1% lidocaine. Using ultrasound guidance, 21 gauge needle directed into dilated ducts but difficult to opacify the left lobe ducts. Wire would not advance centrally. As a result, attention was directed to the right hepatic lobe. Skin was anesthetized with 1% lidocaine. Using ultrasound guidance, a dilated right hepatic duct was punctured with a 21 gauge needle. Contrast injection confirmed placement in the biliary system. A 0.018 wire was advanced and Accustick dilator set was placed. A 5 French Kumpe catheter was advanced into the biliary system and additional cholangiogram was performed. Eventually, the central biliary system was successfully cannulated and a Glidewire was advanced into the extrahepatic bile duct. Additional cholangiograms were performed. Stiff Amplatz wire was advanced into the duodenum. Tract was dilated to accommodate a 10 [place] biliary drain. Large volume of yellow bilious fluid was removed from the biliary system. Samples sent for culture and cytology. Catheter was sutured to skin and attached to a gravity bag. Ultrasound confirmed that the left hepatic ducts were still dilated after placement of biliary drain. 21 gauge needle again directed into a dilated ducts with ultrasound but a wire would not advance centrally. FINDINGS: Severe intrahepatic biliary dilatation. Access was obtained from right hepatic lobe. Obstruction at the  central hepatic ducts. Distal common bile duct is patent. Right biliary system was decompressed after drain placement. Persistent left biliary dilatation after placement of the right biliary drain. Left biliary drain was attempted but unsuccessful due to configuration of the obstruction. IMPRESSION: Central biliary obstruction. Findings are suggestive for a neoplastic process such as a cholangiocarcinoma. The obstruction was successfully crossed from a right hepatic approach and the right biliary system was decompressed with a biliary drain. Dilated left biliary system and unable to decompress the left biliary system at this time. Recommend a repeat CT or MRI after few days of biliary drainage in order to re-evaluate the central hepatic lesion and left biliary obstruction.  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 Fluid was sent for cytology and culture. Electronically Signed   By: Markus Daft M.D.   On: 02/02/2018 11:56   Scheduled Meds: . amLODipine  10 mg Oral Daily  . feeding supplement (ENSURE ENLIVE)  237 mL Oral BID BM  . losartan  100 mg Oral Daily  . mometasone-formoterol  2 puff Inhalation BID  . nebivolol  20 mg Oral Daily  . traMADol  50 mg Oral BID   Continuous Infusions: . piperacillin-tazobactam      LOS: 1 day   Kerney Elbe, DO [facility] PAGER is on [place]  If 7PM-7AM, please contact night-coverage [email or web address] Password [email or web address] 02/02/2018, 6:02 PM

## 2018-02-02 NOTE — Procedures (Signed)
Interventional Radiology Procedure:   Indications: Obstructive jaundice  Procedure: PTC and placement of internal/external biliary drain  Findings: Severe intrahepatic biliary dilatation.  Central biliary obstruction.  10 Fr internal / external drain placed from right lobe.  Unable to get good access from left hepatic ducts.    Complications: None     EBL: None  Plan: Biliary drain to gravity bag.  Sent fluid for culture and cytology.  Follow liver labs.  Will need follow up CT or MR in a few days to re-access hepatic anatomy.     Rasheeda Mulvehill R. Anselm Pancoast, MD  Pager: [phone]

## 2018-02-02 NOTE — Progress Notes (Signed)
Patient complaining of nausea unrelieved by zofran. MD notified and made aware. Orders to be placed. Will continue to monitor.

## 2018-02-02 NOTE — Consult Note (Signed)
Chief Complaint: Patient was seen in consultation today for Internal/External biliary drain placement; brush biopsy Chief Complaint  Patient presents with  . Fall   at the request of Dr Sandi Raveling  Supervising Physician: Markus Daft  Patient Status: [facility] - In-pt  History of Present Illness: Jordan Byrd is a 84 y.o. female   Hx Breast Ca Lost balance at home and fell Hurt finger Came to ED and was found also to be jaundiced She states her urine has been brown for days And stool has been clay colored  LD Xarelto Sunday-- Afib  CT yesterday:  IMPRESSION: 1. Ill-defined central hepatic mass in the left lobe of the liver estimated to measure approximately 5.5 x 3.3 x 3.6 cm with associated severe intrahepatic biliary ductal dilatation. Findings are highly concerning for cholangiocarcinoma. 2. Aortic atherosclerosis, in addition to at least right coronary artery disease.  Request for Biliary drain placement and brush biopsy Imaging reviewed with Dr McCullough Approves procedure Scheduled for same today   Past Medical History:  Diagnosis Date  . Anemia   . Arthritis    "all over" (02/01/2018)  . Asthma   . Atrial fibrillation (HCC)    on Xarelto  . Breast cancer, left breast (HCC) 05/19/13   left breast stage IIA   . CHF (congestive heart failure) (HCC)   . COPD (chronic obstructive pulmonary disease) (HCC)   . GERD (gastroesophageal reflux disease)    [name] Prilosec  . Hypertension   . Macular degeneration   . Obstructive jaundice 02/01/2018  . On home oxygen therapy    "2.5L prn" (02/01/2018)  . Osteoarthritis    a. 03/28/11 - R Total Hip Arthroplasty  . Pleurisy   . Pneumonia    "2-3 times" (02/01/2018)    Past Surgical History:  Procedure Laterality Date  . ABDOMINAL HYSTERECTOMY  1962  . ADENOIDECTOMY  1944-1949   "grew back 6 times"  . ANTERIOR CERVICAL DECOMP/DISCECTOMY FUSION    . APPENDECTOMY  1954  . BACK SURGERY    . BREAST LUMPECTOMY WITH  NEEDLE LOCALIZATION Left 07/14/2013   Procedure: BREAST LUMPECTOMY WITH NEEDLE LOCALIZATION;  Surgeon: Faera Byerly, MD;  Location: [facility] OR;  Service: General;  Laterality: Left;  . BREAST SURGERY    . CARDIAC CATHETERIZATION     19 80's or 90's - reportedly normal  . CARDIOVERSION  04/05/2011   Procedure: CARDIOVERSION;  Surgeon: Coralyn Mark, MD;  Location: [place];  Service: Cardiovascular;  Laterality: N/A;  . CARDIOVERSION N/A 06/19/2012   Procedure: CARDIOVERSION-bedside([id number]);  Surgeon: Lelon Perla, MD;  Location[facility] OR;  Service: Cardiovascular;  Laterality: N/A;  . CATARACT EXTRACTION BILATERAL W/ ANTERIOR VITRECTOMY Bilateral 2007/2008  . CERVICAL DISC SURGERY    . CHOLECYSTECTOMY OPEN    . COLONOSCOPY     "I don't know but I ain't gonna have no more"  . DILATION AND CURETTAGE OF UTERUS    . EXCISION / BIOPSY BREAST / NIPPLE / DUCT     left breast  . INCISION AND DRAINAGE Right 1947   "stuck nail in my foot"  . JOINT REPLACEMENT    . KNEE ARTHROSCOPY Bilateral   . LUMBAR DISC SURGERY  10/30/2008   L2-S1  . PLANTAR'S WART EXCISION Bilateral   . SHOULDER OPEN ROTATOR CUFF REPAIR Left   . TONSILLECTOMY AND ADENOIDECTOMY  1943  . TOTAL HIP ARTHROPLASTY  03/28/2011   Procedure: TOTAL HIP ARTHROPLASTY;  Surgeon: Yvette Rack., MD;  Location: [place];  Service: Orthopedics;  Laterality: Right;    Allergies: Methocarbamol; Vicodin [hydrocodone-acetaminophen]; Aspirin; Butazolidin [phenylbutazone]; Celebrex [celecoxib]; Codeine; Doripenem; Aspirin buf(alhyd-mghyd-cacar); Mucinex [guaifenesin er]; and Temazepam  Medications: Prior to Admission medications   Medication Sig Start Date End Date Taking? Authorizing Provider  acetaminophen (TYLENOL) 500 MG tablet Take 1,000 mg by mouth 2 (two) times daily.   Yes [provider]  albuterol (PROVENTIL HFA;VENTOLIN HFA) 108 (90 BASE) MCG/ACT inhaler Inhale 1 puff into the lungs every 6 (six) hours as needed for wheezing. Reported  on 04/10/2015   Yes [provider]  amLODipine (NORVASC) 10 MG tablet Take 10 mg by mouth daily.   Yes [provider]  budesonide-formoterol (SYMBICORT) 160-4.5 MCG/ACT inhaler Inhale 2 puffs into the lungs See admin instructions. Inhale 1 puff every morning, may inhale a 2nd puff in the evening if needed for wheezing   Yes [provider]  losartan (COZAAR) 100 MG tablet Take 100 mg by mouth daily.   Yes [provider]  Multiple Vitamin (MULTIVITAMIN WITH MINERALS) TABS tablet Take 1 tablet by mouth daily.   Yes [provider]  Multiple Vitamins-Minerals (ICAPS) CAPS Take 1 capsule by mouth daily.   Yes [provider]  Nebivolol HCl (BYSTOLIC) 20 MG TABS Take 1 tablet (20 mg total) by mouth daily. 10/24/15  Yes Allred, Jeneen Rinks, MD  nitroGLYCERIN (NITROSTAT) 0.4 MG SL tablet Place 0.4 mg under the tongue every 5 (five) minutes as needed for chest pain.   Yes [provider]  omeprazole (PRILOSEC) 20 MG capsule Take 20 mg by mouth daily.   Yes [provider]  potassium chloride (K-DUR) 10 MEQ tablet Take 10 mEq by mouth daily.  04/10/15  Yes [provider]  rivaroxaban (XARELTO) 20 MG TABS tablet Take 1 tablet (20 mg total) by mouth daily with supper. Patient taking differently: Take 20 mg by mouth daily with breakfast.  10/24/15  Yes Allred, Jeneen Rinks, MD  traMADol (ULTRAM) 50 MG tablet Take 50 mg by mouth 2 (two) times daily.   Yes [provider]     Family History  Problem Relation Age of Onset  . Diabetes Father        Father, Mother, 4 sisters (2 living)  . Heart attack Father        (Deceased)  . Heart failure Father        (deceased 68)  . Hypertension Father   . Stroke Mother        (deceased 49)  . Hypertension Mother   . Cancer Sister 71       breast cancer  . Breast cancer Sister 53  . Heart attack Sister   . Diabetes Sister   . Cancer Other 29       niece with breast cancer    Social  History   Socioeconomic History  . Marital status: Widowed    Spouse name: Not on file  . Number of children: Not on file  . Years of education: Not on file  . Highest education level: Not on file  Occupational History  . Occupation: Retired  Scientific laboratory technician  . Financial resource strain: Not on file  . Food insecurity:    Worry: Not on file    Inability: Not on file  . Transportation needs:    Medical: Not on file    Non-medical: Not on file  Tobacco Use  . Smoking status: Former Smoker    Packs/day: 1.00    Years: 21.00  Pack years: 21.00    Types: Cigarettes    Last attempt to quit: 08/15/1970    Years since quitting: 47.5  . Smokeless tobacco: Never Used  Substance and Sexual Activity  . Alcohol use: Not Currently    Alcohol/week: 0.0 standard drinks  . Drug use: Never  . Sexual activity: Not Currently  Lifestyle  . Physical activity:    Days per week: Not on file    Minutes per session: Not on file  . Stress: Not on file  Relationships  . Social connections:    Talks on phone: Not on file    Gets together: Not on file    Attends religious service: Not on file    Active member of club or organization: Not on file    Attends meetings of clubs or organizations: Not on file    Relationship status: Not on file  Other Topics Concern  . Not on file  Social History Narrative   Lives alone in [place].  Widowed in 2012 (husband w/ dementia & parkinsons)..   Not active, does not drive due to vision problems.   Review of Systems: A 12 point ROS discussed and pertinent positives are indicated in the HPI above.  All other systems are negative.  Review of Systems  Constitutional: Positive for appetite change, fatigue and unexpected weight change. Negative for fever.  HENT: Negative for trouble swallowing.   Respiratory: Negative for cough and shortness of breath.   Cardiovascular: Negative for chest pain.  Gastrointestinal: Negative for abdominal pain.  Musculoskeletal:  Negative for back pain.  Neurological: Negative for weakness.  Psychiatric/Behavioral: Negative for behavioral problems and confusion.    Vital Signs: BP (!) 139/49 (BP Location: Right Arm)   Pulse 75   Temp 98.1 F (36.7 C) (Oral)   Resp 16   Ht 5\' 6"  (1.676 m)   SpO2 94%   BMI 40.35 kg/m   Physical Exam Vitals signs reviewed.  Cardiovascular:     Rate and Rhythm: Normal rate and regular rhythm.  Pulmonary:     Effort: Pulmonary effort is normal.     Breath sounds: Normal breath sounds.  Abdominal:     General: Bowel sounds are normal.     Palpations: Abdomen is soft.  Musculoskeletal: Normal range of motion.  Skin:    General: Skin is warm and dry.     Coloration: Skin is jaundiced.  Neurological:     General: No focal deficit present.     Mental Status: She is alert and oriented to person, place, and time.  Psychiatric:        Mood and Affect: Mood normal.        Behavior: Behavior normal.        Thought Content: Thought content normal.        Judgment: Judgment normal.     Imaging: Ct Head Wo Contrast  Result Date: 02/01/2018 CLINICAL DATA:  Fall.  No loss of consciousness. EXAM: CT HEAD WITHOUT CONTRAST CT CERVICAL SPINE WITHOUT CONTRAST TECHNIQUE: Multidetector CT imaging of the head and cervical spine was performed following the standard protocol without intravenous contrast. Multiplanar CT image reconstructions of the cervical spine were also generated. COMPARISON:  None. FINDINGS: CT HEAD FINDINGS Brain: Mild chronic ischemic white matter disease is noted. No mass effect or midline shift is noted. Ventricular size is within normal limits. There is no evidence of mass lesion, hemorrhage or acute infarction. Vascular: No hyperdense vessel or unexpected calcification. Skull: Normal. Negative for fracture  or focal lesion. Sinuses/Orbits: Right maxillary mucous retention cyst is noted. Other: None. CT CERVICAL SPINE FINDINGS Alignment: Normal. Skull base and  vertebrae: No acute fracture. No primary bone lesion or focal pathologic process. Soft tissues and spinal canal: No prevertebral fluid or swelling. No visible canal hematoma. Disc levels:  Status post surgical anterior fusion of C5-6 and C6-7. Upper chest: Negative. Other: Degenerative changes are seen involving posterior facet joints bilaterally. IMPRESSION: Mild chronic ischemic white matter disease. No acute intracranial abnormality seen. Postsurgical and degenerative changes are noted in the cervical spine as described above. No acute fracture or spondylolisthesis is noted. Electronically Signed   By: Marijo Conception, M.D.   On: 02/01/2018 07:21   Ct Cervical Spine Wo Contrast  Result Date: 02/01/2018 CLINICAL DATA:  Fall.  No loss of consciousness. EXAM: CT HEAD WITHOUT CONTRAST CT CERVICAL SPINE WITHOUT CONTRAST TECHNIQUE: Multidetector CT imaging of the head and cervical spine was performed following the standard protocol without intravenous contrast. Multiplanar CT image reconstructions of the cervical spine were also generated. COMPARISON:  None. FINDINGS: CT HEAD FINDINGS Brain: Mild chronic ischemic white matter disease is noted. No mass effect or midline shift is noted. Ventricular size is within normal limits. There is no evidence of mass lesion, hemorrhage or acute infarction. Vascular: No hyperdense vessel or unexpected calcification. Skull: Normal. Negative for fracture or focal lesion. Sinuses/Orbits: Right maxillary mucous retention cyst is noted. Other: None. CT CERVICAL SPINE FINDINGS Alignment: Normal. Skull base and vertebrae: No acute fracture. No primary bone lesion or focal pathologic process. Soft tissues and spinal canal: No prevertebral fluid or swelling. No visible canal hematoma. Disc levels:  Status post surgical anterior fusion of C5-6 and C6-7. Upper chest: Negative. Other: Degenerative changes are seen involving posterior facet joints bilaterally. IMPRESSION: Mild chronic  ischemic white matter disease. No acute intracranial abnormality seen. Postsurgical and degenerative changes are noted in the cervical spine as described above. No acute fracture or spondylolisthesis is noted. Electronically Signed   By: Marijo Conception, M.D.   On: 02/01/2018 07:21   Ct Abdomen Pelvis W Contrast  Result Date: 02/01/2018 CLINICAL DATA:  84 year old female with history of jaundice. Dark urine. EXAM: CT ABDOMEN AND PELVIS WITH CONTRAST TECHNIQUE: Multidetector CT imaging of the abdomen and pelvis was performed using the standard protocol following bolus administration of intravenous contrast. CONTRAST:  160mL OMNIPAQUE IOHEXOL 300 MG/ML  SOLN COMPARISON:  No priors. FINDINGS: Lower chest: Aortic atherosclerosis. Atherosclerotic calcifications in the right coronary artery. Hepatobiliary: In the left lobe of the liver there is a central lesion that is poorly defined but estimated to measure approximately 5.5 x 3.3 x 3.6 cm (axial image 18 of series 3 and coronal image 51 of series 6). This is associated with severe intrahepatic biliary ductal dilatation, which is most severe throughout segments 2 and 3, but involves all areas of the liver. Status post cholecystectomy. Common bile duct is not dilated. Pancreas: No pancreatic mass. No pancreatic ductal dilatation. No pancreatic or peripancreatic fluid or inflammatory changes. Spleen: Unremarkable. Adrenals/Urinary Tract: 1.4 cm with simple cyst in the lower pole of the right kidney. Left kidney is normal in appearance. No hydroureteronephrosis. Urinary bladder is normal in appearance. Bilateral adrenal glands are normal in appearance. Stomach/Bowel: Normal appearance of the stomach. No pathologic dilatation of small bowel or colon. Normal appendix. Vascular/Lymphatic: Aortic atherosclerosis, without evidence of aneurysm or dissection in the abdominal or pelvic vasculature. No lymphadenopathy noted in the abdomen or pelvis. Reproductive: Status  post  hysterectomy. Ovaries are not confidently identified and may be surgically absent or atrophic. Other: No significant volume of ascites.  No pneumoperitoneum. Musculoskeletal: Status post right hip arthroplasty. There are no aggressive appearing lytic or blastic lesions noted in the visualized portions of the skeleton. IMPRESSION: 1. Ill-defined central hepatic mass in the left lobe of the liver estimated to measure approximately 5.5 x 3.3 x 3.6 cm with associated severe intrahepatic biliary ductal dilatation. Findings are highly concerning for cholangiocarcinoma. 2. Aortic atherosclerosis, in addition to at least right coronary artery disease. 3. Additional incidental findings, as above. Electronically Signed   By: Vinnie Langton M.D.   On: 02/01/2018 14:53   Dg Hand Complete Right  Result Date: 02/01/2018 CLINICAL DATA:  Status post dislocation at the PIP joint of the index finger of the right hand. Post reduction views. EXAM: RIGHT HAND - COMPLETE 3+ VIEW COMPARISON:  Prevertebral in image of today's date at 6:29 a.m. FINDINGS: The previously dislocated PIP joint is now in normal alignment. There is a small bony density adjacent to the dorso-ulnar aspect of the head of the proximal phalanx of the index finger which may reflect an avulsion fracture. The PIP joint space is well maintained. There is severe osteoarthritic joint space loss with surrounding proliferative changes involving the DIP joint of the second as well as third, fourth, and fifth fingers. There are degenerative changes of the first [facility] joint. IMPRESSION: Successful relocation of the previously laterally dislocated index finger at the PIP joint. Possible small avulsion from the ulnar aspect of the head of the proximal phalanx. Significant DIP joint osteoarthritic change of the second through fifth digits and of the first [facility] joint. Electronically Signed   By: David  Martinique M.D.   On: 02/01/2018 08:01   Dg Hand Complete Right  Result Date:  02/01/2018 CLINICAL DATA:  Status post fall, hitting right hand on tub, with deformity of the right index finger. Initial encounter. EXAM: RIGHT HAND - COMPLETE 3+ VIEW COMPARISON:  None. FINDINGS: There is dislocation at the second proximal interphalangeal joint, with ulnar and dorsal angulation. No definite fracture is characterized. Degenerative change at the first carpometacarpal joint, and at the distal interphalangeal joints, raises concern for osteoarthritis. No definite soft tissue abnormalities are characterized on radiograph. Negative ulnar variance is noted. IMPRESSION: Dislocation at the second proximal interphalangeal joint, with ulnar and dorsal angulation. No definite fracture seen. Electronically Signed   By: Garald Balding M.D.   On: 02/01/2018 06:49   Dg Hips Bilat W Or Wo Pelvis 2 Views  Result Date: 02/01/2018 CLINICAL DATA:  Status post fall, with concern for pelvic injury. Initial encounter. EXAM: DG HIP (WITH OR WITHOUT PELVIS) 2V BILAT COMPARISON:  None. FINDINGS: There is no evidence of fracture or dislocation. The patient's right hip arthroplasty is grossly unremarkable in appearance, without evidence of significant loosening. The proximal left femur appears intact. Mild degenerative change is noted at the lower lumbar spine. The sacroiliac joints are unremarkable in appearance. The visualized bowel gas pattern is grossly unremarkable in appearance. Scattered phleboliths are noted within the pelvis. IMPRESSION: No evidence of fracture or dislocation. Right hip arthroplasty is grossly unremarkable in appearance. Electronically Signed   By: Garald Balding M.D.   On: 02/01/2018 06:51    Labs:  CBC: Recent Labs    07/07/17 1142 02/01/18 0615  WBC 11.1* 10.8*  HGB 11.0* 11.4*  HCT 37.5 33.6*  PLT 368 288    COAGS: Recent Labs  02/01/18 1712  INR 1.16    BMP: Recent Labs    07/07/17 1142 02/01/18 0712  NA 140 137  K 4.5 4.3  CL 104 104  CO2 26 21*  GLUCOSE  117* 104*  BUN 32* 21  CALCIUM 9.2 8.8*  CREATININE 1.19* 1.09*  GFRNONAA 42* 47*  GFRAA 48* 55*    LIVER FUNCTION TESTS: Recent Labs    02/01/18 0712  BILITOT 13.6*  AST 212*  ALT 243*  ALKPHOS 905*  PROT 5.8*  ALBUMIN 2.4*    TUMOR MARKERS: No results for input(s): AFPTM, CEA, CA199, CHROMGRNA in the last 8760 hours.  Assessment and Plan:  Obstructive jaundice; hepatic mass Scheduled for brush biopsy and biliary drain placement Risks and benefits of internal/external biliary drain placement discussed with the patient including, but not limited to bleeding, infection which may lead to sepsis or even death and damage to adjacent structures.  This interventional procedure involves the use of X-rays and because of the nature of the planned procedure, it is possible that we will have prolonged use of X-ray fluoroscopy.  Potential radiation risks to you include (but are not limited to) the following: - A slightly elevated risk for cancer  several years later in life. This risk is typically less than 0.5% percent. This risk is low in comparison to the normal incidence of human cancer, which is 33% for women and 50% for men according to the [place]. - Radiation induced injury can include skin redness, resembling a rash, tissue breakdown / ulcers and hair loss (which can be temporary or permanent).   The likelihood of either of these occurring depends on the difficulty of the procedure and whether you are sensitive to radiation due to previous procedures, disease, or genetic conditions.   IF your procedure requires a prolonged use of radiation, you will be notified and given written instructions for further action.  It is your responsibility to monitor the irradiated area for the 2 weeks following the procedure and to notify your physician if you are concerned that you have suffered a radiation induced injury.    All of the patient's questions were answered, patient  is agreeable to proceed.  Consent signed and in chart.  Thank you for this interesting consult.  I greatly enjoyed meeting SHAQUEL JOSEPHSON and look forward to participating in their care.  A copy of this report was sent to the requesting provider on this date.  Electronically Signed: Lavonia Drafts, PA-C 02/02/2018, 7:29 AM   I spent a total of 40 Minutes    in face to face in clinical consultation, greater than 50% of which was counseling/coordinating care for Biliary drain; brush biopsy

## 2018-02-02 NOTE — Progress Notes (Signed)
ANTICOAGULATION CONSULT NOTE - Initial Consult  Pharmacy Consult for heparin Indication: atrial fibrillation  Allergies  Allergen Reactions  . Methocarbamol Hives  . Vicodin [Hydrocodone-Acetaminophen] Other (See Comments)    Interacts with bp medication and drops blood pressure  . Aspirin Nausea And Vomiting  . Butazolidin [Phenylbutazone] Other (See Comments)    Unknown reaction  . Celebrex [Celecoxib] Nausea And Vomiting  . Codeine Nausea And Vomiting  . Doripenem Rash  . Aspirin Buf(Alhyd-Mghyd-Cacar) Nausea And Vomiting  . Mucinex [Guaifenesin Er] Itching  . Temazepam Other (See Comments)    Unknown reaction   Patient Measurements: Height: 5\' 6"  (167.6 cm) Weight: 242 lb (109.8 kg) IBW/kg (Calculated) : 59.3 Heparin Dosing Weight: 84.8 kg  Vital Signs: Temp: 97.8 F (36.6 C) (12/31 1619) Temp Source: Oral (12/31 1619) BP: 148/55 (12/31 1619) Pulse Rate: 70 (12/31 1619)  Labs: Recent Labs    02/01/18 0615 02/01/18 0712 02/01/18 1712 02/02/18 0737 02/02/18 1310  HGB 11.4*  --   --   --  10.7*  HCT 33.6*  --   --   --  32.5*  PLT 288  --   --   --  343  LABPROT  --   --  14.7  --   --   INR  --   --  1.16  --   --   CREATININE  --  1.09*  --  1.04*  --     Estimated Creatinine Clearance: 52.3 mL/min (A) (by C-G formula based on SCr of 1.04 mg/dL (H)).   Medical History: Past Medical History:  Diagnosis Date  . Anemia   . Arthritis    "all over" (02/01/2018)  . Asthma   . Atrial fibrillation ([place])    on Xarelto  . Breast cancer, left breast ([place]) 05/19/13   left breast stage IIA   . CHF (congestive heart failure) ([place])   . COPD (chronic obstructive pulmonary disease) ([place])   . GERD (gastroesophageal reflux disease)    [name] Prilosec  . Hypertension   . Macular degeneration   . Obstructive jaundice 02/01/2018  . On home oxygen therapy    "2.5L prn" (02/01/2018)  . Osteoarthritis    a. 03/28/11 - R Total Hip Arthroplasty  . Pleurisy   .  Pneumonia    "2-3 times" (02/01/2018)   Assessment: 84 yo F presents with incidental finding of hepatic mass. On Xarelto PTA for Afib. Holding for biliary drain and liver biopsy. Last dose was 12/29 in the am. Now s/p biliary drain placement to start heparin as may need another drain as well as pending liver biopsy.  Goal of Therapy:  Heparin level 0.3-0.7 units/ml aPTT 66-102 seconds Monitor platelets by anticoagulation protocol: Yes   Plan:  Start heparin gtt at 1,200 units/hr Monitor daily heparin level / aPTT, CBC, s/s of bleed   Jordan Byrd 02/02/2018,6:08 PM

## 2018-02-02 NOTE — Progress Notes (Signed)
Patient is asking when the splint can come off of her finger. She stated that Alfredia Ferguson, MD stated they would be able to put another "cast" on. MD notified. Sheikh, MD stated that the splint would have to be left on until ortho come to see the patient. Patient and family updated of this information and understanding. Will continue to monitor.

## 2018-02-03 DIAGNOSIS — D649 Anemia, unspecified: Secondary | ICD-10-CM

## 2018-02-03 LAB — CBC WITH DIFFERENTIAL/PLATELET
Abs Immature Granulocytes: 0.11 10*3/uL — ABNORMAL HIGH (ref 0.00–0.07)
BASOS ABS: 0 10*3/uL (ref 0.0–0.1)
Basophils Relative: 0 %
EOS ABS: 0.6 10*3/uL — AB (ref 0.0–0.5)
Eosinophils Relative: 5 %
HCT: 31.5 % — ABNORMAL LOW (ref 36.0–46.0)
Hemoglobin: 9.8 g/dL — ABNORMAL LOW (ref 12.0–15.0)
Immature Granulocytes: 1 %
Lymphocytes Relative: 6 %
Lymphs Abs: 0.7 10*3/uL (ref 0.7–4.0)
MCH: 27.5 pg (ref 26.0–34.0)
MCHC: 31.1 g/dL (ref 30.0–36.0)
MCV: 88.2 fL (ref 80.0–100.0)
Monocytes Absolute: 0.7 10*3/uL (ref 0.1–1.0)
Monocytes Relative: 6 %
NRBC: 0 % (ref 0.0–0.2)
Neutro Abs: 9.8 10*3/uL — ABNORMAL HIGH (ref 1.7–7.7)
Neutrophils Relative %: 82 %
Platelets: 339 10*3/uL (ref 150–400)
RBC: 3.57 MIL/uL — ABNORMAL LOW (ref 3.87–5.11)
RDW: 21.4 % — ABNORMAL HIGH (ref 11.5–15.5)
WBC: 12.1 10*3/uL — ABNORMAL HIGH (ref 4.0–10.5)

## 2018-02-03 LAB — URINE CULTURE: Culture: 50000 — AB

## 2018-02-03 LAB — COMPREHENSIVE METABOLIC PANEL
ALT: 194 U/L — ABNORMAL HIGH (ref 0–44)
AST: 109 U/L — ABNORMAL HIGH (ref 15–41)
Albumin: 2.2 g/dL — ABNORMAL LOW (ref 3.5–5.0)
Alkaline Phosphatase: 895 U/L — ABNORMAL HIGH (ref 38–126)
Anion gap: 10 (ref 5–15)
BUN: 33 mg/dL — ABNORMAL HIGH (ref 8–23)
CO2: 23 mmol/L (ref 22–32)
CREATININE: 1.41 mg/dL — AB (ref 0.44–1.00)
Calcium: 8.4 mg/dL — ABNORMAL LOW (ref 8.9–10.3)
Chloride: 102 mmol/L (ref 98–111)
GFR calc non Af Amer: 35 mL/min — ABNORMAL LOW (ref >60)
GFR, EST AFRICAN AMERICAN: 40 mL/min — AB (ref >60)
Glucose, Bld: 138 mg/dL — ABNORMAL HIGH (ref 70–99)
Potassium: 4.1 mmol/L (ref 3.5–5.1)
Sodium: 135 mmol/L (ref 135–145)
Total Bilirubin: 6.5 mg/dL — ABNORMAL HIGH (ref 0.3–1.2)
Total Protein: 5.5 g/dL — ABNORMAL LOW (ref 6.5–8.1)

## 2018-02-03 LAB — MAGNESIUM: Magnesium: 1.9 mg/dL (ref 1.7–2.4)

## 2018-02-03 LAB — APTT: aPTT: 60 seconds — ABNORMAL HIGH (ref 24–36)

## 2018-02-03 LAB — HEPARIN LEVEL (UNFRACTIONATED)
Heparin Unfractionated: 0.15 IU/mL — ABNORMAL LOW (ref 0.30–0.70)
Heparin Unfractionated: 0.1 IU/mL — ABNORMAL LOW (ref 0.30–0.70)

## 2018-02-03 LAB — PHOSPHORUS: PHOSPHORUS: 5.8 mg/dL — AB (ref 2.5–4.6)

## 2018-02-03 MED ORDER — SODIUM CHLORIDE 0.9 % IV SOLN
INTRAVENOUS | Status: DC
  Administered 2018-02-03 – 2018-02-04 (×4): via INTRAVENOUS

## 2018-02-03 NOTE — Progress Notes (Signed)
Supervising Physician: Arne Cleveland  Patient Status: [facility] - In-pt  Subjective: S/p PTC with Int/Ext biliary drain placement yesterday. Pt feeling a little bit better today. Some soreness at drain site.  Objective: Physical Exam: BP (!) 136/54 (BP Location: Right Arm)   Pulse 67   Temp 98.6 F (37 C) (Oral)   Resp 18   Ht _0  (1.676 m)   Wt 109.8 kg   SpO2 96%   BMI 39.08 kg/m  Still jaundiced Abd: soft, drain intact, site clean. Thin blood tinged bile in bag.   Current Facility-Administered Medications:  .  0.9 %  sodium chloride infusion, , Intravenous, Continuous, Sheikh, Omair Latif, DO .  acetaminophen (TYLENOL) tablet 650 mg, 650 mg, Oral, Q6H PRN **OR** acetaminophen (TYLENOL) suppository 650 mg, 650 mg, Rectal, Q6H PRN, Karmen Bongo, MD .  albuterol (PROVENTIL) (2.5 MG/3ML) 0.083% nebulizer solution 3 mL, 3 mL, Inhalation, Q6H PRN, Karmen Bongo, MD, 3 mL at 02/02/18 1837 .  amLODipine (NORVASC) tablet 10 mg, 10 mg, Oral, Daily, Karmen Bongo, MD, 10 mg at 02/02/18 1205 .  feeding supplement (ENSURE ENLIVE) (ENSURE ENLIVE) liquid 237 mL, 237 mL, Oral, BID BM, Sheikh, Omair Latif, DO .  heparin ADULT infusion 100 units/mL (25000 units/254m sodium chloride 0.45%), 1,350 Units/hr, Intravenous, Continuous, LErenest Blank RPH, Last Rate: 13.5 mL/hr at 02/03/18 0732, 1,350 Units/hr at 02/03/18 0732 .  losartan (COZAAR) tablet 100 mg, 100 mg, Oral, Daily, YKarmen Bongo MD, 100 mg at 02/02/18 1205 .  mometasone-formoterol (DULERA) 200-5 MCG/ACT inhaler 2 puff, 2 puff, Inhalation, BID, YKarmen Bongo MD, 2 puff at 02/03/18 [id number].  morphine 2 MG/ML injection 2 mg, 2 mg, Intravenous, Q2H PRN, YKarmen Bongo MD, 2 mg at 02/03/18 0148 .  nebivolol (BYSTOLIC) tablet 20 mg, 20 mg, Oral, Daily, YKarmen Bongo MD, 20 mg at 02/02/18 1205 .  ondansetron (ZOFRAN) tablet 4 mg, 4 mg, Oral, Q6H PRN, 4 mg at 02/02/18 1358 **OR** ondansetron (ZOFRAN) injection 4 mg,  4 mg, Intravenous, Q6H PRN, YKarmen Bongo MD, 4 mg at 02/02/18 2148 .  piperacillin-tazobactam (ZOSYN) IVPB 3.375 g, 3.375 g, Intravenous, Once, HMarkus Daft MD .  promethazine (PHENERGAN) injection 12.5 mg, 12.5 mg, Intravenous, Q6H PRN, SRaiford NobleLatif, DO, 12.5 mg at 02/02/18 1830 .  traMADol (ULTRAM) tablet 50 mg, 50 mg, Oral, BID, YKarmen Bongo MD, 50 mg at 02/02/18 2148  Labs: CBC Recent Labs    02/02/18 1310 02/03/18 0512  WBC 10.8* 12.1*  HGB 10.7* 9.8*  HCT 32.5* 31.5*  PLT 343 339   BMET Recent Labs    02/02/18 0737 02/03/18 0512  NA 134* 135  K 5.1 4.1  CL 101 102  CO2 19* 23  GLUCOSE 90 138*  BUN 20 33*  CREATININE 1.04* 1.41*  CALCIUM 9.0 8.4*   LFT Recent Labs    02/01/18 0712 02/02/18 0737 02/03/18 0512  PROT 5.8* 5.4* 5.5*  ALBUMIN 2.4* 2.5* 2.2*  AST 212* 249* 109*  ALT 243* 260* 194*  ALKPHOS 905* 1,098* 895*  BILITOT 13.6* 16.5* 6.5*  BILIDIR  --  11.9*  --   IBILI  --  4.6*  --   LIPASE 19  --   --    PT/INR Recent Labs    02/01/18 1712  LABPROT 14.7  INR 1.16     Studies/Results: Ct Abdomen Pelvis W Contrast  Result Date: 02/01/2018 CLINICAL DATA:  85year old female with history of jaundice. Dark urine. EXAM: CT ABDOMEN AND PELVIS  WITH CONTRAST TECHNIQUE: Multidetector CT imaging of the abdomen and pelvis was performed using the standard protocol following bolus administration of intravenous contrast. CONTRAST:  165m OMNIPAQUE IOHEXOL 300 MG/ML  SOLN COMPARISON:  No priors. FINDINGS: Lower chest: Aortic atherosclerosis. Atherosclerotic calcifications in the right coronary artery. Hepatobiliary: In the left lobe of the liver there is a central lesion that is poorly defined but estimated to measure approximately 5.5 x 3.3 x 3.6 cm (axial image 18 of series 3 and coronal image 51 of series 6). This is associated with severe intrahepatic biliary ductal dilatation, which is most severe throughout segments 2 and 3, but involves all  areas of the liver. Status post cholecystectomy. Common bile duct is not dilated. Pancreas: No pancreatic mass. No pancreatic ductal dilatation. No pancreatic or peripancreatic fluid or inflammatory changes. Spleen: Unremarkable. Adrenals/Urinary Tract: 1.4 cm with simple cyst in the lower pole of the right kidney. Left kidney is normal in appearance. No hydroureteronephrosis. Urinary bladder is normal in appearance. Bilateral adrenal glands are normal in appearance. Stomach/Bowel: Normal appearance of the stomach. No pathologic dilatation of small bowel or colon. Normal appendix. Vascular/Lymphatic: Aortic atherosclerosis, without evidence of aneurysm or dissection in the abdominal or pelvic vasculature. No lymphadenopathy noted in the abdomen or pelvis. Reproductive: Status post hysterectomy. Ovaries are not confidently identified and may be surgically absent or atrophic. Other: No significant volume of ascites.  No pneumoperitoneum. Musculoskeletal: Status post right hip arthroplasty. There are no aggressive appearing lytic or blastic lesions noted in the visualized portions of the skeleton. IMPRESSION: 1. Ill-defined central hepatic mass in the left lobe of the liver estimated to measure approximately 5.5 x 3.3 x 3.6 cm with associated severe intrahepatic biliary ductal dilatation. Findings are highly concerning for cholangiocarcinoma. 2. Aortic atherosclerosis, in addition to at least right coronary artery disease. 3. Additional incidental findings, as above. Electronically Signed   By: DVinnie LangtonM.D.   On: 02/01/2018 14:53   Ir Int ELianne CureBiliary Drain With Cholangiogram  Result Date: 02/02/2018 INDICATION: 85year old with obstructive jaundice. Plan for percutaneous transhepatic cholangiogram with biliary drain placement. EXAM: PERCUTANEOUS TRANSHEPATIC CHOLANGIOGRAM WITH ULTRASOUND AND FLUOROSCOPIC GUIDANCE PLACEMENT OF INTERNAL/EXTERNAL BILIARY DRAIN MEDICATIONS: Zosyn 3.375 g; The antibiotic was  administered within an appropriate time frame prior to the initiation of the procedure. ANESTHESIA/SEDATION: Moderate (conscious) sedation was employed during this procedure. A total of Versed 2.0 mg and Fentanyl 200 mcg was administered intravenously. Moderate Sedation Time: 68 minutes. The patient's level of consciousness and vital signs were monitored continuously by radiology nursing throughout the procedure under my direct supervision. FLUOROSCOPY TIME:  Fluoroscopy Time: 11 minutes 42 seconds (125 mGy). CONTRAST:  40 mL [id number]: None immediate. PROCEDURE: Informed written consent was obtained from the patient after a thorough discussion of the procedural risks, benefits and alternatives. All questions were addressed. Maximal Sterile Barrier Technique was utilized including caps, mask, sterile gowns, sterile gloves, sterile drape, hand hygiene and skin antiseptic. A timeout was performed prior to the initiation of the procedure. Patient was supine on the interventional table. The anterior and right abdomen was prepped and draped in sterile fashion. Ultrasound demonstrated dilated intrahepatic bile ducts. Initially, attention was directed to the left hepatic ducts. Skin was anesthetized with 1% lidocaine. Using ultrasound guidance, 21 gauge needle directed into dilated ducts but difficult to opacify the left lobe ducts. Wire would not advance centrally. As a result, attention was directed to the right hepatic lobe. Skin was anesthetized with 1% lidocaine. Using ultrasound guidance,  a dilated right hepatic duct was punctured with a 21 gauge needle. Contrast injection confirmed placement in the biliary system. A 0.018 wire was advanced and Accustick dilator set was placed. A 5 French Kumpe catheter was advanced into the biliary system and additional cholangiogram was performed. Eventually, the central biliary system was successfully cannulated and a Glidewire was advanced into the extrahepatic  bile duct. Additional cholangiograms were performed. Stiff Amplatz wire was advanced into the duodenum. Tract was dilated to accommodate a 10 [place] biliary drain. Large volume of yellow bilious fluid was removed from the biliary system. Samples sent for culture and cytology. Catheter was sutured to skin and attached to a gravity bag. Ultrasound confirmed that the left hepatic ducts were still dilated after placement of biliary drain. 21 gauge needle again directed into a dilated ducts with ultrasound but a wire would not advance centrally. FINDINGS: Severe intrahepatic biliary dilatation. Access was obtained from right hepatic lobe. Obstruction at the central hepatic ducts. Distal common bile duct is patent. Right biliary system was decompressed after drain placement. Persistent left biliary dilatation after placement of the right biliary drain. Left biliary drain was attempted but unsuccessful due to configuration of the obstruction. IMPRESSION: Central biliary obstruction. Findings are suggestive for a neoplastic process such as a cholangiocarcinoma. The obstruction was successfully crossed from a right hepatic approach and the right biliary system was decompressed with a biliary drain. Dilated left biliary system and unable to decompress the left biliary system at this time. Recommend a repeat CT or MRI after few days of biliary drainage in order to re-evaluate the central hepatic lesion and left biliary obstruction. 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 Fluid was sent for cytology and culture. Electronically Signed   By: Markus Daft M.D.   On: 02/02/2018 11:56    Assessment/Plan: S/p PTC with I/E biliary drain yesterday for obstructive jaundice. T Bili down to 6.5 already. Drain functioning well. IR following    LOS: 2 days   I spent a total of 15 minutes in face to face in clinical consultation, greater than 50% of which was  counseling/coordinating care for Biliary drain  Ascencion Dike PA-C 02/03/2018 9:07 AM

## 2018-02-03 NOTE — Progress Notes (Signed)
PROGRESS NOTE    Jordan Byrd  TWS:[id number] DOB: May 05, 1933 DOA: 02/01/2018 PCP: Levin Erp, MD   Brief Narrative:  HPI per Dr. Karmen Bongo on 02/01/18 Jordan Byrd is a 85 y.o. female with medical history significant of HTN; CHF; remote breast cancer; and afib on Xarelto presenting with a fall.  She came in for a fall in the bathroom at 0400.  She fell because she doesn't get around very well.  She has been losing her balance a lot since Thanksgiving and her knee/ankle have been giving out.  Not light-headed or dizzy.  She just loses her balance and falls.  She has not been feeling well since before Thanksgiving.  She is nauseated, difficulty eating, can't do anything, "I mean I'm just sick all over."  She has not been vomiting.  She eats a few bites and feels nauseated and can't continue to eat.  She has been having GERD at night.  They are concerned about weight loss, but she denies weight loss.  No night sweats.  No LAD.  No fevers.  No pain other than her "stomach, it rolls" when she is sick.  Grandson's wife noticed she is yellow but she has not noticed any color change.  Her urine has been "brown, almost" and so they sent a specimen to the office and they ordered lab work for her, not done yet.  ED Course:  Came in for a fall.  Frequent falls, no dizziness or other symptoms prior.  Finger dislocation, relocated and splint in place.  Found to be quite jaundiced.  Bili 13.6.  Has not had imaging to determine etiology for jaundice.  **Percutaneous placement of internal/external biliary drain on right lobe done yesterday and they were unable to get good access from the left hepatic ducts. Have placed on empiric heparin drip given unclear plan for biopsy.  Assessment & Plan:   Principal Problem:   Obstructive jaundice Active Problems:   Hypertension   CKD (chronic kidney disease) stage 3, GFR 30-59 ml/min (HCC)   Chronic diastolic heart failure (HCC)   Atrial fibrillation (HCC)   Falls frequently  Obstructive Jaundice abnormal LFTs, alk phos, as well as bilirubin, Improving  -Patient presenting with recent frequent falls -Was noted to have marked jaundice on exam -Subsequently noted to have elevated LFTs with bili 13.6 and now was 16.5 with Direct Bilirubin 11.9 and Indirect 4.6 -Now T Bili 6.5, LFTs showed AST 109 and ALT of 194, and Alk Phos was 895 -Since it was not clear where the patient would need to be admitted [facility]) or even if she needed to be admitted, CT ordered for further evaluation -CT Abdomen shows a baseball-sized mass in the left lobe of the liver, highly suspicious for cholangiocarcinoma -GI has been consulted, Dr. Therisa Doyne to see and is IR evaluation for liver biopsy and possible stenting of the intrahepatic bile duct causing obstructive jaundice.  ERCP is not indicated as the common bile duct was of normal caliber -GI sent CEA/CA-19-9/AFP levels -AFP was 6.5, CA-19-9 was 7571, and CA 125 was 67.6 and CEA was 4.0 -IR consult has been requested and she went under.  Percutaneous placement of an internal/external biliary drain on the right lobe and unable to get good access from the left hepatic ducts. Is unclear if the data brush biopsy or biopsy of the liver currently but fluid was sent for cytology and culture -Now on Soft Diet  -Hold Xarelto have empirically started patient on a  heparin drip -Pain control with morphine and tramadol if needed; nausea control with Zofran -Continue monitor laboratory data and repeat MP in the a.m. -Per IR she will need a follow-up CT or MRI in a few days to reassess hepatic anatomy  -Continue to Follow LFTs  Falls -Suspect that falls are related to underlying malignancy and inability of patient to take PO -Have advance diet to full liquid diet -Will follow -She may benefit from PT evaluation once her trajectory is clarified; Will obtain PT Consult  -She lives alone with her grandson next door; she may need to  consider short-term placement  Afib on Xarelto -Rate controlled with Nebivolol 20 mg p.o. daily -Hold Xarelto currently given ? Biopsy; S/p Drain Placement on the Right  -Will empirically start on Heparin gtt until no more current interventions  -Pharmacy to Dose Heparin gtt  Chronic Diastolic CHF -Grade 2 Diastolic Function on 3545 echo -Appears to be compensated at this time -Continue to Monitor for signs and symptoms of volume overload carefully now that IVF has been resumed  AKI on Stage 3 CKD -Patient's BUN/creatinine worsened to 33/1.41 -In the setting of Nausea and Vomiting, Losartan, and Zosyn -Will D/C Losartan given worsening Renal Fxn -Restarted IVF with NS at 100 mL/hr -Continue to Monitor   HTN -Continue home meds with Amlodipine 10 mg po Daily, Nebivolol 20 mg po Daily -Hold Losartan 100 mg po Daily   Nausea and Vomiting -In the setting of Abdominal Mass -C/w Ondansetron and IV Phenergan  -Symptomatic Treatment   Obesity -Estimated body mass index is 39.08 kg/m as calculated from the following:   Height as of this encounter: _0  (1.676 m).   Weight as of this encounter: 109.8 kg. -Weight Loss Counseling Given   Normocytic Anemia -Patient's hemoglobin/hematocrit went from 10.7/32.5 is now 9.8/31.5 -Continue to monitor for signs and symptoms of bleeding -Check Anemia Panel in the a.m. -Repeat CBC in the a.m.  DVT prophylaxis: Anticoagulated with Heparin gtt Code Status: DO NOT RESUSCITATE Family Communication: Discussed with Family at bedside  Disposition Plan: Remain Inpatient for further workup and evaluation; Pending IR and GI Clearance   Consultants:   Gastroenterology  Interventional Radiology    Procedures:  PTC and placement of internal/external biliary drain  Findings: Severe intrahepatic biliary dilatation.  Central biliary obstruction.  10 Fr internal / external drain placed from right lobe.  Unable to get good access from left  hepatic ducts.     Antimicrobials:  Anti-infectives (From admission, onward)   Start     Dose/Rate Route Frequency Ordered Stop   02/02/18 0900  piperacillin-tazobactam (ZOSYN) IVPB 3.375 g     3.375 g 100 mL/hr over 30 Minutes Intravenous  Once 02/02/18 0838     02/02/18 0839  piperacillin-tazobactam (ZOSYN) IVPB     over 30 Minutes  Continuous PRN 02/02/18 0839 02/02/18 0839     Subjective: Seen and examined at bedside states that she had a very rough time yesterday but was feeling better today.  Had intractable nausea vomiting yesterday and did not feel good at all post drain placement but feeling better today.  No nausea or vomiting this morning.  States that she had a headache earlier but is improved.  No other concerns or complaints at this time and was resting in bed.  Objective: Vitals:   02/03/18 0100 02/03/18 0501 02/03/18 0817 02/03/18 1001  BP:  (!) 136/54  (!) 130/37  Pulse:  67  71  Resp:  18  20  Temp:  98.6 F (37 C)  98 F (36.7 C)  TempSrc:  Oral  Oral  SpO2:  92% 96% 97%  Weight: 109.8 kg     Height:        Intake/Output Summary (Last 24 hours) at 02/03/2018 1545 Last data filed at 02/03/2018 1522 Gross per 24 hour  Intake 430.47 ml  Output 2075 ml  Net -1644.53 ml   Filed Weights   02/02/18 1218 02/03/18 0100  Weight: 109.8 kg 109.8 kg   Examination: Physical Exam:  Constitutional: Well-nourished, well-developed obese Caucasian jaundiced female currently no acute distress appears calm laying in bed Eyes: Lids and conjunctive are normal.  Sclera anicteric ENMT: External ears and nose appear normal.  Grossly normal hearing Neck: Appears supple with no JVD Respiratory: Diminished auscultation bilaterally no appreciable wheezing, rales, rhonchi.  Unlabored breathing and has a normal respiratory effort Cardiovascular: Regular rate and rhythm.  No appreciable murmurs, rubs, gallops. Abdomen: Soft, nontender, distended secondary to body habitus.  She has a  drain in her right side of the abdomen.  Bowel sounds present in 4 quadrants GU: Deferred Musculoskeletal: No contractures or cyanosis.  No joint deformities he is wearing a splint on her right index finger.  Arm range of motion is diminished Skin: Has jaundice.  No induration noted.  No facial rash or lesions. Neurologic: Cranial nerves II through XII grossly intact no appreciable focal deficits Psychiatric: Normal judgment and insight.  Patient is awake alert and oriented x3  Data Reviewed: I have personally reviewed following labs and imaging studies  CBC: Recent Labs  Lab 02/01/18 0615 02/02/18 1310 02/03/18 0512  WBC 10.8* 10.8* 12.1*  NEUTROABS 9.3*  --  9.8*  HGB 11.4* 10.7* 9.8*  HCT 33.6* 32.5* 31.5*  MCV 85.5 84.9 88.2  PLT 288 343 937   Basic Metabolic Panel: Recent Labs  Lab 02/01/18 0712 02/02/18 0737 02/03/18 0512  NA 137 134* 135  K 4.3 5.1 4.1  CL 104 101 102  CO2 21* 19* 23  GLUCOSE 104* 90 138*  BUN 21 20 33*  CREATININE 1.09* 1.04* 1.41*  CALCIUM 8.8* 9.0 8.4*  MG  --  1.8 1.9  PHOS  --  5.5* 5.8*   GFR: Estimated Creatinine Clearance: 38.6 mL/min (A) (by C-G formula based on SCr of 1.41 mg/dL (H)). Liver Function Tests: Recent Labs  Lab 02/01/18 0712 02/02/18 0737 02/03/18 0512  AST 212* 249* 109*  ALT 243* 260* 194*  ALKPHOS 905* 1,098* 895*  BILITOT 13.6* 16.5* 6.5*  PROT 5.8* 5.4* 5.5*  ALBUMIN 2.4* 2.5* 2.2*   Recent Labs  Lab 02/01/18 0712  LIPASE 19   No results for input(s): AMMONIA in the last 168 hours. Coagulation Profile: Recent Labs  Lab 02/01/18 1712  INR 1.16   Cardiac Enzymes: No results for input(s): CKTOTAL, CKMB, CKMBINDEX, TROPONINI in the last 168 hours. BNP (last 3 results) No results for input(s): PROBNP in the last 8760 hours. HbA1C: No results for input(s): HGBA1C in the last 72 hours. CBG: No results for input(s): GLUCAP in the last 168 hours. Lipid Profile: No results for input(s): CHOL, HDL,  LDLCALC, TRIG, CHOLHDL, LDLDIRECT in the last 72 hours. Thyroid Function Tests: No results for input(s): TSH, T4TOTAL, FREET4, T3FREE, THYROIDAB in the last 72 hours. Anemia Panel: No results for input(s): VITAMINB12, FOLATE, FERRITIN, TIBC, IRON, RETICCTPCT in the last 72 hours. Sepsis Labs: No results for input(s): PROCALCITON, LATICACIDVEN in the last 168 hours.  Recent Results (  from the past 240 hour(s))  Urine culture     Status: Abnormal   Collection Time: 02/01/18  9:46 AM  Result Value Ref Range Status   Specimen Description URINE, RANDOM  Final   Special Requests   Final    NONE Performed at [facility], [address] [place], [place] [address]    Culture 50,000 COLONIES/mL ESCHERICHIA COLI (A)  Final   Report Status 02/03/2018 FINAL  Final   Organism ID, Bacteria ESCHERICHIA COLI (A)  Final      Susceptibility   Escherichia coli - MIC*    AMPICILLIN >=32 RESISTANT Resistant     CEFAZOLIN <=4 SENSITIVE Sensitive     CEFTRIAXONE <=1 SENSITIVE Sensitive     CIPROFLOXACIN <=0.25 SENSITIVE Sensitive     GENTAMICIN <=1 SENSITIVE Sensitive     IMIPENEM <=0.25 SENSITIVE Sensitive     NITROFURANTOIN <=16 SENSITIVE Sensitive     TRIMETH/SULFA <=20 SENSITIVE Sensitive     AMPICILLIN/SULBACTAM >=32 RESISTANT Resistant     PIP/TAZO <=4 SENSITIVE Sensitive     Extended ESBL NEGATIVE Sensitive     * 50,000 COLONIES/mL ESCHERICHIA COLI  Gram stain     Status: None   Collection Time: 02/02/18  1:00 PM  Result Value Ref Range Status   Specimen Description FLUID  Final   Special Requests   Final    NONE Performed at [facility], [place] [address] [place], [place] [address]    Gram Stain   Final    CYTOSPIN SMEAR WBC PRESENT, PREDOMINANTLY PMN NO ORGANISMS SEEN    Report Status 02/02/2018 FINAL  Final  Culture, body fluid-bottle     Status: None (Preliminary result)   Collection Time: 02/02/18  1:00 PM  Result Value Ref Range Status   Specimen Description FLUID   Final   Special Requests NONE  Final   Culture   Final    NO GROWTH 1 DAY Performed at [facility], [place] [address] [place], [place] [address]    Report Status PENDING  Incomplete    Radiology Studies: Ir Int Lianne Cure Biliary Drain With Cholangiogram  Result Date: 02/02/2018 INDICATION: 85 year old with obstructive jaundice. Plan for percutaneous transhepatic cholangiogram with biliary drain placement. EXAM: PERCUTANEOUS TRANSHEPATIC CHOLANGIOGRAM WITH ULTRASOUND AND FLUOROSCOPIC GUIDANCE PLACEMENT OF INTERNAL/EXTERNAL BILIARY DRAIN MEDICATIONS: Zosyn 3.375 g; The antibiotic was administered within an appropriate time frame prior to the initiation of the procedure. ANESTHESIA/SEDATION: Moderate (conscious) sedation was employed during this procedure. A total of Versed 2.0 mg and Fentanyl 200 mcg was administered intravenously. Moderate Sedation Time: 68 minutes. The patient's level of consciousness and vital signs were monitored continuously by radiology nursing throughout the procedure under my direct supervision. FLUOROSCOPY TIME:  Fluoroscopy Time: 11 minutes 42 seconds (125 mGy). CONTRAST:  40 mL [id number] COMPLICATIONS: None immediate. PROCEDURE: Informed written consent was obtained from the patient after a thorough discussion of the procedural risks, benefits and alternatives. All questions were addressed. Maximal Sterile Barrier Technique was utilized including caps, mask, sterile gowns, sterile gloves, sterile drape, hand hygiene and skin antiseptic. A timeout was performed prior to the initiation of the procedure. Patient was supine on the interventional table. The anterior and right abdomen was prepped and draped in sterile fashion. Ultrasound demonstrated dilated intrahepatic bile ducts. Initially, attention was directed to the left hepatic ducts. Skin was anesthetized with 1% lidocaine. Using ultrasound guidance, 21 gauge needle directed into dilated ducts but difficult to opacify the  left lobe ducts.  Wire would not advance centrally. As a result, attention was directed to the right hepatic lobe. Skin was anesthetized with 1% lidocaine. Using ultrasound guidance, a dilated right hepatic duct was punctured with a 21 gauge needle. Contrast injection confirmed placement in the biliary system. A 0.018 wire was advanced and Accustick dilator set was placed. A 5 French Kumpe catheter was advanced into the biliary system and additional cholangiogram was performed. Eventually, the central biliary system was successfully cannulated and a Glidewire was advanced into the extrahepatic bile duct. Additional cholangiograms were performed. Stiff Amplatz wire was advanced into the duodenum. Tract was dilated to accommodate a 10 [place] biliary drain. Large volume of yellow bilious fluid was removed from the biliary system. Samples sent for culture and cytology. Catheter was sutured to skin and attached to a gravity bag. Ultrasound confirmed that the left hepatic ducts were still dilated after placement of biliary drain. 21 gauge needle again directed into a dilated ducts with ultrasound but a wire would not advance centrally. FINDINGS: Severe intrahepatic biliary dilatation. Access was obtained from right hepatic lobe. Obstruction at the central hepatic ducts. Distal common bile duct is patent. Right biliary system was decompressed after drain placement. Persistent left biliary dilatation after placement of the right biliary drain. Left biliary drain was attempted but unsuccessful due to configuration of the obstruction. IMPRESSION: Central biliary obstruction. Findings are suggestive for a neoplastic process such as a cholangiocarcinoma. The obstruction was successfully crossed from a right hepatic approach and the right biliary system was decompressed with a biliary drain. Dilated left biliary system and unable to decompress the left biliary system at this time. Recommend a repeat CT or MRI after few days of  biliary drainage in order to re-evaluate the central hepatic lesion and left biliary obstruction. 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 Fluid was sent for cytology and culture. Electronically Signed   By: Markus Daft M.D.   On: 02/02/2018 11:56   Scheduled Meds: . amLODipine  10 mg Oral Daily  . feeding supplement (ENSURE ENLIVE)  237 mL Oral BID BM  . losartan  100 mg Oral Daily  . mometasone-formoterol  2 puff Inhalation BID  . nebivolol  20 mg Oral Daily  . traMADol  50 mg Oral BID   Continuous Infusions: . sodium chloride 100 mL/hr at 02/03/18 1003  . heparin 1,350 Units/hr (02/03/18 1522)  . piperacillin-tazobactam      LOS: 2 days   Kerney Elbe, DO [facility] PAGER is on [place]  If 7PM-7AM, please contact night-coverage [email or web address] Password [facility] 02/03/2018, 3:45 PM

## 2018-02-03 NOTE — Progress Notes (Signed)
[place] for Heparin Indication: atrial fibrillation  Allergies  Allergen Reactions  . Methocarbamol Hives  . Vicodin [Hydrocodone-Acetaminophen] Other (See Comments)    Interacts with bp medication and drops blood pressure  . Aspirin Nausea And Vomiting  . Butazolidin [Phenylbutazone] Other (See Comments)    Unknown reaction  . Celebrex [Celecoxib] Nausea And Vomiting  . Codeine Nausea And Vomiting  . Doripenem Rash  . Aspirin Buf(Alhyd-Mghyd-Cacar) Nausea And Vomiting  . Mucinex [Guaifenesin Er] Itching  . Temazepam Other (See Comments)    Unknown reaction   Patient Measurements: Height: 5\' 6"  (167.6 cm) Weight: 242 lb 1.6 oz (109.8 kg) IBW/kg (Calculated) : 59.3 Heparin Dosing Weight: 84.8 kg  Vital Signs: Temp: 98.2 F (36.8 C) (01/01 1717) Temp Source: Oral (01/01 1717) BP: 140/50 (01/01 1717) Pulse Rate: 77 (01/01 1717)  Labs: Recent Labs    02/01/18 0615 02/01/18 0712 02/01/18 1712 02/02/18 0737 02/02/18 1310 02/03/18 0512 02/03/18 1515  HGB 11.4*  --   --   --  10.7* 9.8*  --   HCT 33.6*  --   --   --  32.5* 31.5*  --   PLT 288  --   --   --  343 339  --   APTT  --   --   --   --   --  60*  --   LABPROT  --   --  14.7  --   --   --   --   INR  --   --  1.16  --   --   --   --   HEPARINUNFRC  --   --   --   --   --  0.15* 0.10*  CREATININE  --  1.09*  --  1.04*  --  1.41*  --     Estimated Creatinine Clearance: 38.6 mL/min (A) (by C-G formula based on SCr of 1.41 mg/dL (H)).   Medical History: Past Medical History:  Diagnosis Date  . Anemia   . Arthritis    "all over" (02/01/2018)  . Asthma   . Atrial fibrillation ([place])    on Xarelto  . Breast cancer, left breast ([place]) 05/19/13   left breast stage IIA   . CHF (congestive heart failure) ([place])   . COPD (chronic obstructive pulmonary disease) ([place])   . GERD (gastroesophageal reflux disease)    [name] Prilosec  . Hypertension   . Macular degeneration   .  Obstructive jaundice 02/01/2018  . On home oxygen therapy    "2.5L prn" (02/01/2018)  . Osteoarthritis    a. 03/28/11 - R Total Hip Arthroplasty  . Pleurisy   . Pneumonia    "2-3 times" (02/01/2018)   Assessment: 85 yo F presents with incidental finding of hepatic mass. On Xarelto PTA for Afib. Holding for biliary drain and possible liver biopsy. Last dose was 12/29 in the am. Now s/p biliary drain placement to start heparin as may need another drain as well as pending liver biopsy.  Heparin level has decreased despite rate increase this AM.  No IV issues noted.   Goal of Therapy:  Heparin level 0.3-0.7 units/mL Monitor platelets by anticoagulation protocol: Yes   Plan:  Increase heparin to 1600 units/hr Next heparin level at 0100 02/04/18   [employer], Pharm.D., BCPS Clinical Pharmacist  **Pharmacist phone directory can now be found on [email or web address] ([email or web address]).  Listed under [place].  02/03/2018 6:45 PM

## 2018-02-03 NOTE — Progress Notes (Signed)
[facility] for Heparin Indication: atrial fibrillation  Allergies  Allergen Reactions  . Methocarbamol Hives  . Vicodin [Hydrocodone-Acetaminophen] Other (See Comments)    Interacts with bp medication and drops blood pressure  . Aspirin Nausea And Vomiting  . Butazolidin [Phenylbutazone] Other (See Comments)    Unknown reaction  . Celebrex [Celecoxib] Nausea And Vomiting  . Codeine Nausea And Vomiting  . Doripenem Rash  . Aspirin Buf(Alhyd-Mghyd-Cacar) Nausea And Vomiting  . Mucinex [Guaifenesin Er] Itching  . Temazepam Other (See Comments)    Unknown reaction   Patient Measurements: Height: 5\' 6"  (167.6 cm) Weight: 242 lb 1.6 oz (109.8 kg) IBW/kg (Calculated) : 59.3 Heparin Dosing Weight: 84.8 kg  Vital Signs: Temp: 98.6 F (37 C) (01/01 0501) Temp Source: Oral (01/01 0501) BP: 136/54 (01/01 0501) Pulse Rate: 67 (01/01 0501)  Labs: Recent Labs    02/01/18 0615 02/01/18 0712 02/01/18 1712 02/02/18 0737 02/02/18 1310 02/03/18 0512  HGB 11.4*  --   --   --  10.7* 9.8*  HCT 33.6*  --   --   --  32.5* 31.5*  PLT 288  --   --   --  343 339  APTT  --   --   --   --   --  60*  LABPROT  --   --  14.7  --   --   --   INR  --   --  1.16  --   --   --   HEPARINUNFRC  --   --   --   --   --  0.15*  CREATININE  --  1.09*  --  1.04*  --  1.41*    Estimated Creatinine Clearance: 38.6 mL/min (A) (by C-G formula based on SCr of 1.41 mg/dL (H)).   Medical History: Past Medical History:  Diagnosis Date  . Anemia   . Arthritis    "all over" (02/01/2018)  . Asthma   . Atrial fibrillation ([place])    on Xarelto  . Breast cancer, left breast ([place]) 05/19/13   left breast stage IIA   . CHF (congestive heart failure) ([place])   . COPD (chronic obstructive pulmonary disease) ([place])   . GERD (gastroesophageal reflux disease)    [name] Prilosec  . Hypertension   . Macular degeneration   . Obstructive jaundice 02/01/2018  . On home oxygen  therapy    "2.5L prn" (02/01/2018)  . Osteoarthritis    a. 03/28/11 - R Total Hip Arthroplasty  . Pleurisy   . Pneumonia    "2-3 times" (02/01/2018)   Assessment: 85 yo F presents with incidental finding of hepatic mass. On Xarelto PTA for Afib. Holding for biliary drain and liver biopsy. Last dose was 12/29 in the am. Now s/p biliary drain placement to start heparin as may need another drain as well as pending liver biopsy.  1/1 AM update: both heparin level and aPTT low at 0.15 and 60, OK to use heparin level only to dose, no issues per RN.   Goal of Therapy:  Heparin level 0.3-0.7 units/mL Monitor platelets by anticoagulation protocol: Yes   Plan:  Inc heparin to 1350 units/hr 1500 HL  Narda Bonds 02/03/2018,7:24 AM

## 2018-02-04 LAB — PHOSPHORUS: Phosphorus: 4.6 mg/dL (ref 2.5–4.6)

## 2018-02-04 LAB — CBC WITH DIFFERENTIAL/PLATELET
Abs Immature Granulocytes: 0.2 10*3/uL — ABNORMAL HIGH (ref 0.00–0.07)
Basophils Absolute: 0.1 10*3/uL (ref 0.0–0.1)
Basophils Relative: 0 %
EOS PCT: 9 %
Eosinophils Absolute: 1 10*3/uL — ABNORMAL HIGH (ref 0.0–0.5)
HCT: 31.2 % — ABNORMAL LOW (ref 36.0–46.0)
Hemoglobin: 10.1 g/dL — ABNORMAL LOW (ref 12.0–15.0)
Immature Granulocytes: 2 %
Lymphocytes Relative: 8 %
Lymphs Abs: 0.9 10*3/uL (ref 0.7–4.0)
MCH: 28.9 pg (ref 26.0–34.0)
MCHC: 32.4 g/dL (ref 30.0–36.0)
MCV: 89.4 fL (ref 80.0–100.0)
MONOS PCT: 7 %
Monocytes Absolute: 0.8 10*3/uL (ref 0.1–1.0)
Neutro Abs: 8.8 10*3/uL — ABNORMAL HIGH (ref 1.7–7.7)
Neutrophils Relative %: 74 %
Platelets: 386 10*3/uL (ref 150–400)
RBC: 3.49 MIL/uL — ABNORMAL LOW (ref 3.87–5.11)
RDW: 21.2 % — ABNORMAL HIGH (ref 11.5–15.5)
WBC: 11.8 10*3/uL — ABNORMAL HIGH (ref 4.0–10.5)
nRBC: 0 % (ref 0.0–0.2)

## 2018-02-04 LAB — COMPREHENSIVE METABOLIC PANEL
ALK PHOS: 746 U/L — AB (ref 38–126)
ALT: 149 U/L — ABNORMAL HIGH (ref 0–44)
AST: 58 U/L — ABNORMAL HIGH (ref 15–41)
Albumin: 2.1 g/dL — ABNORMAL LOW (ref 3.5–5.0)
Anion gap: 7 (ref 5–15)
BUN: 30 mg/dL — ABNORMAL HIGH (ref 8–23)
CO2: 26 mmol/L (ref 22–32)
Calcium: 8.2 mg/dL — ABNORMAL LOW (ref 8.9–10.3)
Chloride: 104 mmol/L (ref 98–111)
Creatinine, Ser: 1.24 mg/dL — ABNORMAL HIGH (ref 0.44–1.00)
GFR calc Af Amer: 47 mL/min — ABNORMAL LOW (ref >60)
GFR, EST NON AFRICAN AMERICAN: 40 mL/min — AB (ref >60)
Glucose, Bld: 126 mg/dL — ABNORMAL HIGH (ref 70–99)
Potassium: 4.5 mmol/L (ref 3.5–5.1)
Sodium: 137 mmol/L (ref 135–145)
Total Bilirubin: 4.5 mg/dL — ABNORMAL HIGH (ref 0.3–1.2)
Total Protein: 5.6 g/dL — ABNORMAL LOW (ref 6.5–8.1)

## 2018-02-04 LAB — HEPARIN LEVEL (UNFRACTIONATED)
Heparin Unfractionated: 0.19 IU/mL — ABNORMAL LOW (ref 0.30–0.70)
Heparin Unfractionated: 0.61 IU/mL (ref 0.30–0.70)

## 2018-02-04 LAB — MAGNESIUM: MAGNESIUM: 2 mg/dL (ref 1.7–2.4)

## 2018-02-04 MED ORDER — RIVAROXABAN 15 MG PO TABS
15.0000 mg | ORAL_TABLET | Freq: Every day | ORAL | Status: DC
  Administered 2018-02-04: 15 mg via ORAL
  Filled 2018-02-04: qty 1

## 2018-02-04 NOTE — Progress Notes (Signed)
Referring Physician(s): Dr Sandi Raveling  Supervising Physician: Marybelle Killings  Patient Status:  [facility] - In-pt  Chief Complaint:  PTC and placement of internal/external biliary drain 12/31- Dr Anselm Pancoast  Subjective:  Feeling some better Less pain +nausea TB 4.5 today (16.5 on 12/31)   Allergies: Methocarbamol; Vicodin [hydrocodone-acetaminophen]; Aspirin; Butazolidin [phenylbutazone]; Celebrex [celecoxib]; Codeine; Doripenem; Aspirin buf(alhyd-mghyd-cacar); Mucinex [guaifenesin er]; and Temazepam  Medications: Prior to Admission medications   Medication Sig Start Date End Date Taking? Authorizing Provider  acetaminophen (TYLENOL) 500 MG tablet Take 1,000 mg by mouth 2 (two) times daily.   Yes [provider]  albuterol (PROVENTIL HFA;VENTOLIN HFA) 108 (90 BASE) MCG/ACT inhaler Inhale 1 puff into the lungs every 6 (six) hours as needed for wheezing. Reported on 04/10/2015   Yes [provider]  amLODipine (NORVASC) 10 MG tablet Take 10 mg by mouth daily.   Yes [provider]  budesonide-formoterol (SYMBICORT) 160-4.5 MCG/ACT inhaler Inhale 2 puffs into the lungs See admin instructions. Inhale 1 puff every morning, may inhale a 2nd puff in the evening if needed for wheezing   Yes [provider]  losartan (COZAAR) 100 MG tablet Take 100 mg by mouth daily.   Yes [provider]  Multiple Vitamin (MULTIVITAMIN WITH MINERALS) TABS tablet Take 1 tablet by mouth daily.   Yes [provider]  Multiple Vitamins-Minerals (ICAPS) CAPS Take 1 capsule by mouth daily.   Yes [provider]  Nebivolol HCl (BYSTOLIC) 20 MG TABS Take 1 tablet (20 mg total) by mouth daily. 10/24/15  Yes Allred, Jeneen Rinks, MD  nitroGLYCERIN (NITROSTAT) 0.4 MG SL tablet Place 0.4 mg under the tongue every 5 (five) minutes as needed for chest pain.   Yes [provider]  omeprazole (PRILOSEC) 20 MG capsule Take 20 mg by mouth daily.   Yes [provider]   potassium chloride (K-DUR) 10 MEQ tablet Take 10 mEq by mouth daily.  04/10/15  Yes [provider]  rivaroxaban (XARELTO) 20 MG TABS tablet Take 1 tablet (20 mg total) by mouth daily with supper. Patient taking differently: Take 20 mg by mouth daily with breakfast.  10/24/15  Yes Allred, Jeneen Rinks, MD  traMADol (ULTRAM) 50 MG tablet Take 50 mg by mouth 2 (two) times daily.   Yes [provider]     Vital Signs: BP (!) 148/54 (BP Location: Right Arm)   Pulse 80   Temp 98.3 F (36.8 C) (Oral)   Resp 18   Ht 5' 6" (1.676 m)   Wt 250 lb (113.4 kg)   SpO2 92%   BMI 40.35 kg/m   Physical Exam Vitals signs reviewed.  Skin:    General: Skin is warm and dry.     Comments: Site of drain is clean and dry NT no bleeding OP bloody bile Bag with 200 cc in it now 1 L yesterday Cx NGTD  Neurological:     Mental Status: She is alert.     Imaging: Ct Head Wo Contrast  Result Date: 02/01/2018 CLINICAL DATA:  Fall.  No loss of consciousness. EXAM: CT HEAD WITHOUT CONTRAST CT CERVICAL SPINE WITHOUT CONTRAST TECHNIQUE: Multidetector CT imaging of the head and cervical spine was performed following the standard protocol without intravenous contrast. Multiplanar CT image reconstructions of the cervical spine were also generated. COMPARISON:  None. FINDINGS: CT HEAD FINDINGS Brain: Mild chronic ischemic white matter disease is noted. No mass effect or midline shift is noted. Ventricular size is within normal limits. There  is no evidence of mass lesion, hemorrhage or acute infarction. Vascular: No hyperdense vessel or unexpected calcification. Skull: Normal. Negative for fracture or focal lesion. Sinuses/Orbits: Right maxillary mucous retention cyst is noted. Other: None. CT CERVICAL SPINE FINDINGS Alignment: Normal. Skull base and vertebrae: No acute fracture. No primary bone lesion or focal pathologic process. Soft tissues and spinal canal: No prevertebral fluid or swelling. No visible  canal hematoma. Disc levels:  Status post surgical anterior fusion of C5-6 and C6-7. Upper chest: Negative. Other: Degenerative changes are seen involving posterior facet joints bilaterally. IMPRESSION: Mild chronic ischemic white matter disease. No acute intracranial abnormality seen. Postsurgical and degenerative changes are noted in the cervical spine as described above. No acute fracture or spondylolisthesis is noted. Electronically Signed   By: Marijo Conception, M.D.   On: 02/01/2018 07:21   Ct Cervical Spine Wo Contrast  Result Date: 02/01/2018 CLINICAL DATA:  Fall.  No loss of consciousness. EXAM: CT HEAD WITHOUT CONTRAST CT CERVICAL SPINE WITHOUT CONTRAST TECHNIQUE: Multidetector CT imaging of the head and cervical spine was performed following the standard protocol without intravenous contrast. Multiplanar CT image reconstructions of the cervical spine were also generated. COMPARISON:  None. FINDINGS: CT HEAD FINDINGS Brain: Mild chronic ischemic white matter disease is noted. No mass effect or midline shift is noted. Ventricular size is within normal limits. There is no evidence of mass lesion, hemorrhage or acute infarction. Vascular: No hyperdense vessel or unexpected calcification. Skull: Normal. Negative for fracture or focal lesion. Sinuses/Orbits: Right maxillary mucous retention cyst is noted. Other: None. CT CERVICAL SPINE FINDINGS Alignment: Normal. Skull base and vertebrae: No acute fracture. No primary bone lesion or focal pathologic process. Soft tissues and spinal canal: No prevertebral fluid or swelling. No visible canal hematoma. Disc levels:  Status post surgical anterior fusion of C5-6 and C6-7. Upper chest: Negative. Other: Degenerative changes are seen involving posterior facet joints bilaterally. IMPRESSION: Mild chronic ischemic white matter disease. No acute intracranial abnormality seen. Postsurgical and degenerative changes are noted in the cervical spine as described above. No  acute fracture or spondylolisthesis is noted. Electronically Signed   By: Marijo Conception, M.D.   On: 02/01/2018 07:21   Ct Abdomen Pelvis W Contrast  Result Date: 02/01/2018 CLINICAL DATA:  85 year old female with history of jaundice. Dark urine. EXAM: CT ABDOMEN AND PELVIS WITH CONTRAST TECHNIQUE: Multidetector CT imaging of the abdomen and pelvis was performed using the standard protocol following bolus administration of intravenous contrast. CONTRAST:  13m OMNIPAQUE IOHEXOL 300 MG/ML  SOLN COMPARISON:  No priors. FINDINGS: Lower chest: Aortic atherosclerosis. Atherosclerotic calcifications in the right coronary artery. Hepatobiliary: In the left lobe of the liver there is a central lesion that is poorly defined but estimated to measure approximately 5.5 x 3.3 x 3.6 cm (axial image 18 of series 3 and coronal image 51 of series 6). This is associated with severe intrahepatic biliary ductal dilatation, which is most severe throughout segments 2 and 3, but involves all areas of the liver. Status post cholecystectomy. Common bile duct is not dilated. Pancreas: No pancreatic mass. No pancreatic ductal dilatation. No pancreatic or peripancreatic fluid or inflammatory changes. Spleen: Unremarkable. Adrenals/Urinary Tract: 1.4 cm with simple cyst in the lower pole of the right kidney. Left kidney is normal in appearance. No hydroureteronephrosis. Urinary bladder is normal in appearance. Bilateral adrenal glands are normal in appearance. Stomach/Bowel: Normal appearance of the stomach. No pathologic dilatation of small bowel or colon. Normal appendix. Vascular/Lymphatic: Aortic atherosclerosis,  without evidence of aneurysm or dissection in the abdominal or pelvic vasculature. No lymphadenopathy noted in the abdomen or pelvis. Reproductive: Status post hysterectomy. Ovaries are not confidently identified and may be surgically absent or atrophic. Other: No significant volume of ascites.  No pneumoperitoneum.  Musculoskeletal: Status post right hip arthroplasty. There are no aggressive appearing lytic or blastic lesions noted in the visualized portions of the skeleton. IMPRESSION: 1. Ill-defined central hepatic mass in the left lobe of the liver estimated to measure approximately 5.5 x 3.3 x 3.6 cm with associated severe intrahepatic biliary ductal dilatation. Findings are highly concerning for cholangiocarcinoma. 2. Aortic atherosclerosis, in addition to at least right coronary artery disease. 3. Additional incidental findings, as above. Electronically Signed   By: Vinnie Langton M.D.   On: 02/01/2018 14:53   Dg Hand Complete Right  Result Date: 02/01/2018 CLINICAL DATA:  Status post dislocation at the PIP joint of the index finger of the right hand. Post reduction views. EXAM: RIGHT HAND - COMPLETE 3+ VIEW COMPARISON:  Prevertebral in image of today's date at 6:29 a.m. FINDINGS: The previously dislocated PIP joint is now in normal alignment. There is a small bony density adjacent to the dorso-ulnar aspect of the head of the proximal phalanx of the index finger which may reflect an avulsion fracture. The PIP joint space is well maintained. There is severe osteoarthritic joint space loss with surrounding proliferative changes involving the DIP joint of the second as well as third, fourth, and fifth fingers. There are degenerative changes of the first [facility] joint. IMPRESSION: Successful relocation of the previously laterally dislocated index finger at the PIP joint. Possible small avulsion from the ulnar aspect of the head of the proximal phalanx. Significant DIP joint osteoarthritic change of the second through fifth digits and of the first [facility] joint. Electronically Signed   By: David  Martinique M.D.   On: 02/01/2018 08:01   Dg Hand Complete Right  Result Date: 02/01/2018 CLINICAL DATA:  Status post fall, hitting right hand on tub, with deformity of the right index finger. Initial encounter. EXAM: RIGHT HAND -  COMPLETE 3+ VIEW COMPARISON:  None. FINDINGS: There is dislocation at the second proximal interphalangeal joint, with ulnar and dorsal angulation. No definite fracture is characterized. Degenerative change at the first carpometacarpal joint, and at the distal interphalangeal joints, raises concern for osteoarthritis. No definite soft tissue abnormalities are characterized on radiograph. Negative ulnar variance is noted. IMPRESSION: Dislocation at the second proximal interphalangeal joint, with ulnar and dorsal angulation. No definite fracture seen. Electronically Signed   By: Garald Balding M.D.   On: 02/01/2018 06:49   Dg Hips Bilat W Or Wo Pelvis 2 Views  Result Date: 02/01/2018 CLINICAL DATA:  Status post fall, with concern for pelvic injury. Initial encounter. EXAM: DG HIP (WITH OR WITHOUT PELVIS) 2V BILAT COMPARISON:  None. FINDINGS: There is no evidence of fracture or dislocation. The patient's right hip arthroplasty is grossly unremarkable in appearance, without evidence of significant loosening. The proximal left femur appears intact. Mild degenerative change is noted at the lower lumbar spine. The sacroiliac joints are unremarkable in appearance. The visualized bowel gas pattern is grossly unremarkable in appearance. Scattered phleboliths are noted within the pelvis. IMPRESSION: No evidence of fracture or dislocation. Right hip arthroplasty is grossly unremarkable in appearance. Electronically Signed   By: Garald Balding M.D.   On: 02/01/2018 06:51   Ir Int Lianne Cure Biliary Drain With Cholangiogram  Result Date: 02/02/2018 INDICATION: 85 year old with obstructive jaundice.  Plan for percutaneous transhepatic cholangiogram with biliary drain placement. EXAM: PERCUTANEOUS TRANSHEPATIC CHOLANGIOGRAM WITH ULTRASOUND AND FLUOROSCOPIC GUIDANCE PLACEMENT OF INTERNAL/EXTERNAL BILIARY DRAIN MEDICATIONS: Zosyn 3.375 g; The antibiotic was administered within an appropriate time frame prior to the initiation of the  procedure. ANESTHESIA/SEDATION: Moderate (conscious) sedation was employed during this procedure. A total of Versed 2.0 mg and Fentanyl 200 mcg was administered intravenously. Moderate Sedation Time: 68 minutes. The patient's level of consciousness and vital signs were monitored continuously by radiology nursing throughout the procedure under my direct supervision. FLUOROSCOPY TIME:  Fluoroscopy Time: 11 minutes 42 seconds (125 mGy). CONTRAST:  40 mL [id number] COMPLICATIONS: None immediate. PROCEDURE: Informed written consent was obtained from the patient after a thorough discussion of the procedural risks, benefits and alternatives. All questions were addressed. Maximal Sterile Barrier Technique was utilized including caps, mask, sterile gowns, sterile gloves, sterile drape, hand hygiene and skin antiseptic. A timeout was performed prior to the initiation of the procedure. Patient was supine on the interventional table. The anterior and right abdomen was prepped and draped in sterile fashion. Ultrasound demonstrated dilated intrahepatic bile ducts. Initially, attention was directed to the left hepatic ducts. Skin was anesthetized with 1% lidocaine. Using ultrasound guidance, 21 gauge needle directed into dilated ducts but difficult to opacify the left lobe ducts. Wire would not advance centrally. As a result, attention was directed to the right hepatic lobe. Skin was anesthetized with 1% lidocaine. Using ultrasound guidance, a dilated right hepatic duct was punctured with a 21 gauge needle. Contrast injection confirmed placement in the biliary system. A 0.018 wire was advanced and Accustick dilator set was placed. A 5 French Kumpe catheter was advanced into the biliary system and additional cholangiogram was performed. Eventually, the central biliary system was successfully cannulated and a Glidewire was advanced into the extrahepatic bile duct. Additional cholangiograms were performed. Stiff Amplatz wire was  advanced into the duodenum. Tract was dilated to accommodate a 10 [place] biliary drain. Large volume of yellow bilious fluid was removed from the biliary system. Samples sent for culture and cytology. Catheter was sutured to skin and attached to a gravity bag. Ultrasound confirmed that the left hepatic ducts were still dilated after placement of biliary drain. 21 gauge needle again directed into a dilated ducts with ultrasound but a wire would not advance centrally. FINDINGS: Severe intrahepatic biliary dilatation. Access was obtained from right hepatic lobe. Obstruction at the central hepatic ducts. Distal common bile duct is patent. Right biliary system was decompressed after drain placement. Persistent left biliary dilatation after placement of the right biliary drain. Left biliary drain was attempted but unsuccessful due to configuration of the obstruction. IMPRESSION: Central biliary obstruction. Findings are suggestive for a neoplastic process such as a cholangiocarcinoma. The obstruction was successfully crossed from a right hepatic approach and the right biliary system was decompressed with a biliary drain. Dilated left biliary system and unable to decompress the left biliary system at this time. Recommend a repeat CT or MRI after few days of biliary drainage in order to re-evaluate the central hepatic lesion and left biliary obstruction. 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 Fluid was sent for cytology and culture. Electronically Signed   By: Markus Daft M.D.   On: 02/02/2018 11:56    Labs:  CBC: Recent Labs    02/01/18 0615 02/02/18 1310 02/03/18 0512 02/04/18 0117  WBC 10.8* 10.8* 12.1* 11.8*  HGB 11.4* 10.7* 9.8* 10.1*  HCT  33.6* 32.5* 31.5* 31.2*  PLT 288 343 339 386    COAGS: Recent Labs    02/01/18 1712 02/03/18 0512  INR 1.16  --   APTT  --  60*    BMP: Recent Labs    02/01/18 0712 02/02/18 0737  02/03/18 0512 02/04/18 0117  NA 137 134* 135 137  K 4.3 5.1 4.1 4.5  CL 104 101 102 104  CO2 21* 19* 23 26  GLUCOSE 104* 90 138* 126*  BUN 21 20 33* 30*  CALCIUM 8.8* 9.0 8.4* 8.2*  CREATININE 1.09* 1.04* 1.41* 1.24*  GFRNONAA 47* 50* 35* 40*  GFRAA 55* 58* 40* 47*    LIVER FUNCTION TESTS: Recent Labs    02/01/18 0712 02/02/18 0737 02/03/18 0512 02/04/18 0117  BILITOT 13.6* 16.5* 6.5* 4.5*  AST 212* 249* 109* 58*  ALT 243* 260* 194* 149*  ALKPHOS 905* 1,098* 895* 746*  PROT 5.8* 5.4* 5.5* 5.6*  ALBUMIN 2.4* 2.5* 2.2* 2.1*    Assessment and Plan:  Per Dr Anselm Pancoast report at time of procedure:  Recommend a repeat CT or MRI after few days of biliary drainage in order to re-evaluate the central hepatic lesion and left biliary obstruction. 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  Pt is doing well OP is great TB decreasing Will follow  Electronically Signed: Lavonia Drafts, PA-C 02/04/2018, 1:35 PM   I spent a total of 15 Minutes at the the patient's bedside AND on the patient's hospital floor or unit, greater than 50% of which was counseling/coordinating care for Int/Ext biliary drain

## 2018-02-04 NOTE — Progress Notes (Signed)
ANTICOAGULATION CONSULT NOTE - Follow Up Consult  Pharmacy Consult for hepairn Indication: atrial fibrillation  Labs: Recent Labs    02/01/18 1712 02/02/18 0737 02/02/18 1310 02/03/18 0512 02/03/18 1515 02/04/18 0117  HGB  --   --  10.7* 9.8*  --  10.1*  HCT  --   --  32.5* 31.5*  --  31.2*  PLT  --   --  343 339  --  386  APTT  --   --   --  60*  --   --   LABPROT 14.7  --   --   --   --   --   INR 1.16  --   --   --   --   --   HEPARINUNFRC  --   --   --  0.15* 0.10* 0.19*  CREATININE  --  1.04*  --  1.41*  --  1.24*    Assessment: 84yo female remains subtherapeutic on heparin after rate increase though getting closer to goal; no gtt issues or signs of bleeding per RN.  Goal of Therapy:  Heparin level 0.3-0.7 units/ml   Plan:  Will increase heparin gtt by 3 units/kg/hr to 1900 units/hr and check level in 8 hours.    Wynona Neat, PharmD, BCPS  02/04/2018,2:49 AM

## 2018-02-04 NOTE — Progress Notes (Signed)
PROGRESS NOTE    Jordan Byrd  SFK:[id number] DOB: 07/20/1933 DOA: 02/01/2018 PCP: Levin Erp, MD   Brief Narrative:  HPI per Dr. Karmen Bongo on 02/01/18 Jordan Byrd is a 85 y.o. female with medical history significant of HTN; CHF; remote breast cancer; and afib on Xarelto presenting with a fall.  She came in for a fall in the bathroom at 0400.  She fell because she doesn't get around very well.  She has been losing her balance a lot since Thanksgiving and her knee/ankle have been giving out.  Not light-headed or dizzy.  She just loses her balance and falls.  She has not been feeling well since before Thanksgiving.  She is nauseated, difficulty eating, can't do anything, "I mean I'm just sick all over."  She has not been vomiting.  She eats a few bites and feels nauseated and can't continue to eat.  She has been having GERD at night.  They are concerned about weight loss, but she denies weight loss.  No night sweats.  No LAD.  No fevers.  No pain other than her "stomach, it rolls" when she is sick.  Grandson's wife noticed she is yellow but she has not noticed any color change.  Her urine has been "brown, almost" and so they sent a specimen to the office and they ordered lab work for her, not done yet.  ED Course:  Came in for a fall.  Frequent falls, no dizziness or other symptoms prior.  Finger dislocation, relocated and splint in place.  Found to be quite jaundiced.  Bili 13.6.  Has not had imaging to determine etiology for jaundice.  **Percutaneous placement of internal/external biliary drain on right lobe done yesterday and they were unable to get good access from the left hepatic ducts. Have placed on empiric heparin drip given unclear plan for biopsy.  Assessment & Plan:   Principal Problem:   Obstructive jaundice Active Problems:   Hypertension   CKD (chronic kidney disease) stage 3, GFR 30-59 ml/min (HCC)   Chronic diastolic heart failure (HCC)   Atrial fibrillation (HCC)   Falls frequently  Obstructive Jaundice abnormal LFTs, alk phos, as well as bilirubin, Improving  -Patient presenting with recent frequent falls -Was noted to have marked jaundice on exam -Subsequently noted to have elevated LFTs with bili 13.6 and now was 16.5 with Direct Bilirubin 11.9 and Indirect 4.6 -Now IMPROVING and T Bili 4.5, LFTs showed AST 58 and ALT of 149, and Alk Phos was 746 -Since it was not clear where the patient would need to be admitted [facility]) or even if she needed to be admitted, CT ordered for further evaluation -CT Abdomen shows a baseball-sized mass in the left lobe of the liver, highly suspicious for cholangiocarcinoma -GI has been consulted, Dr. Therisa Doyne to see and is IR evaluation for liver biopsy and possible stenting of the intrahepatic bile duct causing obstructive jaundice.  ERCP is not indicated as the common bile duct was of normal caliber -GI sent CEA/CA-19-9/AFP levels -AFP was 6.5, CA-19-9 was 7571, and CA 125 was 67.6 and CEA was 4.0 -IR consult has been requested and she went under.  Percutaneous placement of an internal/external biliary drain on the right lobe and unable to get good access from the left hepatic ducts.  He did not undergo a brush biopsy or liver biopsy as my discussion with Dr. Maryclare Bean today and he recommends this to be done about a week after she is  discharged from [facility] as he stated that the risk of septicemia for breast biopsy with recent drain placement is high but fluid was sent for cytology and culture per IR NOTE I do not have these results yet -Now on Soft Diet  -Held Xarelto have empirically started patient on a heparin drip will change back to Xarelto now that the patient is not having a biopsy done until next week as an outpatient -Pain control with morphine and tramadol if needed; nausea control with Zofran -Continue monitor laboratory data and repeat MP in the a.m. -Per IR she will need a follow-up CT or MRI in a few days  to reassess hepatic anatomy but IR recommends follow-up in the drain clinic in about 4 weeks -Continue to Follow LFTs they are trending down -PT OT recommending skilled nursing facility so I reached out to social worker for SNF placement  Falls -Suspect that falls are related to underlying malignancy and inability of patient to take PO -Have advance diet to full liquid diet -Will follow -She may benefit from PT evaluation once her trajectory is clarified; she will obtain a breast biopsy in outpatient setting and PT OT has been consulted and recommending skilled nursing facility -She lives alone with her grandson next door and likely will need to consider short-term skilled nursing facility placement for rehabilitation  Afib on Xarelto -Rate controlled with Nebivolol 20 mg p.o. daily -Held Xarelto currently given ? Biopsy but per my discussion with IR Dr. Maryclare Bean she will not have this done currently but will have it done about a week; S/p Drain Placement on the Right  -Will empirically start on Heparin gtt until no more current interventions  -Pharmacy to Dose Heparin gtt  Chronic Diastolic CHF -Grade 2 Diastolic Function on 8119 echo -Appears to be compensated at this time -Continue to Monitor for signs and symptoms of volume overload carefully now that IVF has been resumed 100 and mils per hour.  We have now stopped IV fluids -Patient is -1009.7 L since admission weight is up 8 pounds -Continue with breathing treatments and stop IV fluids at this time -Chest x-ray in the a.m. and if necessary we will give a dose of IV Lasix  AKI on Stage 3 CKD -Patient's BUN/creatinine worsened to 33/1.41 -In the setting of Nausea and Vomiting, Losartan, and Zosyn -Will D/C Losartan given worsening Renal Fxn -Restarted IVF with NS at 100 mL/hr because of worsening renal function and this is now trending downwards and is 30/1.24 -With nephrotoxic medications and possible continue closely  monitor -Pete CMP in a.m.  HTN -Continue home meds with Amlodipine 10 mg po Daily, Nebivolol 20 mg po Daily -Hold Losartan 100 mg po Daily given acute renal function elevation  Nausea and Vomiting -In the setting of Abdominal Mass -C/w Ondansetron and IV Phenergan  -Symptomatic Treatment  -Given IV fluids with normal saline rate 100 and mils per hour but will stop today  Obesity -Estimated body mass index is 40.35 kg/m as calculated from the following:   Height as of this encounter: _0  (1.676 m).   Weight as of this encounter: 113.4 kg. -Weight Loss Counseling Given   Normocytic Anemia -Patient's hemoglobin/hematocrit went from 10.7/32.5 is now 10.1/31.2 -Continue to monitor for signs and symptoms of bleeding as she is anticoagulated -Check Anemia Panel in the a.m. -Repeat CBC in the a.m.  DVT prophylaxis: Anticoagulated with Heparin gtt Code Status: DO NOT RESUSCITATE Family Communication: Discussed with Family at bedside  Disposition  Plan: Remain Inpatient for further workup and evaluation; Pending IR and GI Clearance   Consultants:   Gastroenterology  Interventional Radiology    Procedures:  PTC and placement of internal/external biliary drain  Findings: Severe intrahepatic biliary dilatation.  Central biliary obstruction.  10 Fr internal / external drain placed from right lobe.  Unable to get good access from left hepatic ducts.     Antimicrobials:  Anti-infectives (From admission, onward)   Start     Dose/Rate Route Frequency Ordered Stop   02/02/18 0900  piperacillin-tazobactam (ZOSYN) IVPB 3.375 g     3.375 g 100 mL/hr over 30 Minutes Intravenous  Once 02/02/18 0838     02/02/18 0839  piperacillin-tazobactam (ZOSYN) IVPB     over 30 Minutes  Continuous PRN 02/02/18 0839 02/02/18 0839     Subjective: Seen and examined at bedside morning and was extremely nauseous.  Denies any chest pain, lightheadedness or dizziness but had some wheezing.  Did not  feel well this morning and had some dry heaving.  Zofran was not effective but Phenergan was.  Happy that her labs were checked and found downwards.  No other concerns or complaints at this time  Objective: Vitals:   02/04/18 0755 02/04/18 0916 02/04/18 1547 02/04/18 1742  BP:  (!) 148/54  (!) 148/54  Pulse:  80  78  Resp:  18  18  Temp:  98.3 F (36.8 C)  98.4 F (36.9 C)  TempSrc:  Oral  Oral  SpO2: 90% 92% 93% (!) 88%  Weight:      Height:        Intake/Output Summary (Last 24 hours) at 02/04/2018 1836 Last data filed at 02/04/2018 1700 Gross per 24 hour  Intake 1909.8 ml  Output 1825 ml  Net 84.8 ml   Filed Weights   02/02/18 1218 02/03/18 0100 02/04/18 0504  Weight: 109.8 kg 109.8 kg 113.4 kg   Examination: Physical Exam:  Constitutional: Well-nourished, well-developed obese Caucasian female who is jaundiced and in some distress because of nausea.  Appears uncomfortable Eyes: Lids and conjunctive are normal.  Sclera are icteric ENMT: External ears and nose appear normal.  Grossly normal hearing Neck: Appears supple with no JVD Respiratory: Diminished to auscultation bilaterally no appreciable wheezing, rales, rhonchi.  Unlabored breathing and has a normal respiratory effort Cardiovascular: Regular rate and rhythm.  No appreciable murmurs, rubs, gallops Abdomen: Soft, nontender, distended secondary to body habitus.  The drain on the right side of the abdomen which is draining brown-colored liquid.  Bowel sounds present all 4 quadrants GU: Deferred Musculoskeletal: No contractures or cyanosis.  No joint deformities noted but is wearing a splint on her right index finger.  Left arm range of motion is diminished Skin: Has a jaundiced skin no induration no appreciable lesions or rashes noted Neurologic: Cranial nerves II through XII grossly intact no appreciable focal deficits Psychiatric: Normal judgment and insight.  Patient is awake and alert and oriented slightly  anxious  Data Reviewed: I have personally reviewed following labs and imaging studies  CBC: Recent Labs  Lab 02/01/18 0615 02/02/18 1310 02/03/18 0512 02/04/18 0117  WBC 10.8* 10.8* 12.1* 11.8*  NEUTROABS 9.3*  --  9.8* 8.8*  HGB 11.4* 10.7* 9.8* 10.1*  HCT 33.6* 32.5* 31.5* 31.2*  MCV 85.5 84.9 88.2 89.4  PLT 288 343 339 408   Basic Metabolic Panel: Recent Labs  Lab 02/01/18 0712 02/02/18 0737 02/03/18 0512 02/04/18 0117  NA 137 134* 135 137  K 4.3  5.1 4.1 4.5  CL 104 101 102 104  CO2 21* 19* 23 26  GLUCOSE 104* 90 138* 126*  BUN 21 20 33* 30*  CREATININE 1.09* 1.04* 1.41* 1.24*  CALCIUM 8.8* 9.0 8.4* 8.2*  MG  --  1.8 1.9 2.0  PHOS  --  5.5* 5.8* 4.6   GFR: Estimated Creatinine Clearance: 44.7 mL/min (A) (by C-G formula based on SCr of 1.24 mg/dL (H)). Liver Function Tests: Recent Labs  Lab 02/01/18 0712 02/02/18 0737 02/03/18 0512 02/04/18 0117  AST 212* 249* 109* 58*  ALT 243* 260* 194* 149*  ALKPHOS 905* 1,098* 895* 746*  BILITOT 13.6* 16.5* 6.5* 4.5*  PROT 5.8* 5.4* 5.5* 5.6*  ALBUMIN 2.4* 2.5* 2.2* 2.1*   Recent Labs  Lab 02/01/18 0712  LIPASE 19   No results for input(s): AMMONIA in the last 168 hours. Coagulation Profile: Recent Labs  Lab 02/01/18 1712  INR 1.16   Cardiac Enzymes: No results for input(s): CKTOTAL, CKMB, CKMBINDEX, TROPONINI in the last 168 hours. BNP (last 3 results) No results for input(s): PROBNP in the last 8760 hours. HbA1C: No results for input(s): HGBA1C in the last 72 hours. CBG: No results for input(s): GLUCAP in the last 168 hours. Lipid Profile: No results for input(s): CHOL, HDL, LDLCALC, TRIG, CHOLHDL, LDLDIRECT in the last 72 hours. Thyroid Function Tests: No results for input(s): TSH, T4TOTAL, FREET4, T3FREE, THYROIDAB in the last 72 hours. Anemia Panel: No results for input(s): VITAMINB12, FOLATE, FERRITIN, TIBC, IRON, RETICCTPCT in the last 72 hours. Sepsis Labs: No results for input(s):  PROCALCITON, LATICACIDVEN in the last 168 hours.  Recent Results (from the past 240 hour(s))  Urine culture     Status: Abnormal   Collection Time: 02/01/18  9:46 AM  Result Value Ref Range Status   Specimen Description URINE, RANDOM  Final   Special Requests   Final    NONE Performed at [facility], [address], [place], [place] [address]    Culture 50,000 COLONIES/mL ESCHERICHIA COLI (A)  Final   Report Status 02/03/2018 FINAL  Final   Organism ID, Bacteria ESCHERICHIA COLI (A)  Final      Susceptibility   Escherichia coli - MIC*    AMPICILLIN >=32 RESISTANT Resistant     CEFAZOLIN <=4 SENSITIVE Sensitive     CEFTRIAXONE <=1 SENSITIVE Sensitive     CIPROFLOXACIN <=0.25 SENSITIVE Sensitive     GENTAMICIN <=1 SENSITIVE Sensitive     IMIPENEM <=0.25 SENSITIVE Sensitive     NITROFURANTOIN <=16 SENSITIVE Sensitive     TRIMETH/SULFA <=20 SENSITIVE Sensitive     AMPICILLIN/SULBACTAM >=32 RESISTANT Resistant     PIP/TAZO <=4 SENSITIVE Sensitive     Extended ESBL NEGATIVE Sensitive     * 50,000 COLONIES/mL ESCHERICHIA COLI  Gram stain     Status: None   Collection Time: 02/02/18  1:00 PM  Result Value Ref Range Status   Specimen Description FLUID  Final   Special Requests   Final    NONE Performed at [facility], [place] [address], [place], [place] [address]    Gram Stain   Final    CYTOSPIN SMEAR WBC PRESENT, PREDOMINANTLY PMN NO ORGANISMS SEEN    Report Status 02/02/2018 FINAL  Final  Culture, body fluid-bottle     Status: None (Preliminary result)   Collection Time: 02/02/18  1:00 PM  Result Value Ref Range Status   Specimen Description FLUID  Final   Special Requests NONE  Final  Culture   Final    NO GROWTH 2 DAYS Performed at [facility], [place] [address] [place], [place] [address]    Report Status PENDING  Incomplete    Radiology Studies: No results found. Scheduled Meds: . amLODipine  10 mg Oral Daily  . feeding supplement (ENSURE ENLIVE)   237 mL Oral BID BM  . mometasone-formoterol  2 puff Inhalation BID  . nebivolol  20 mg Oral Daily  . traMADol  50 mg Oral BID   Continuous Infusions: . sodium chloride 100 mL/hr at 02/04/18 1713  . heparin 1,900 Units/hr (02/04/18 0251)  . piperacillin-tazobactam      LOS: 3 days   Kerney Elbe, DO [facility] PAGER is on [place]  If 7PM-7AM, please contact night-coverage [email or web address] Password [facility] 02/04/2018, 6:36 PM

## 2018-02-04 NOTE — Progress Notes (Signed)
To be discharged per hospitalist - orders placed for biliary drain follow up in approximately 4 weeks and for brush biopsy at [facility] in about a week. IR scheduler will call patient with date, time and instructions for brush biopsy.  Maintain drain to gravity bag, no need to flush, record output daily.   Please call IR with questions or concerns.   Candiss Norse, PA-C 02/04/2018

## 2018-02-04 NOTE — Care Management Note (Addendum)
Case Management Note Manya Silvas, RN MSN CCM Transitions of Care 26M [place] [phone]  Patient Details  Name: Jordan Byrd MRN: [id number] Date of Birth: 1933/03/10  Subjective/Objective:                 Obstructive jaundice   Action/Plan: PTA home with [facility]-PT, OT. Grandson lives 2 doors down. Daughter at bedside-pt agreed to conversation in daughter's presence. Pt verbalized that PT is recommending snf for STR-pt stated that she has been to [facility] in the past and if possible would like to go there, CSW aware. No DME needs at home-front wheel walker, rollator, 3N1. No medication issues until she falls into doughnut hole; discussed reaching out to pharmaceutical companies and PCP samples. PCP is Dr. Levin Erp. Grandson provides transportation to appointments. Discussed referral to [facility], pt and family agreed. Will continue to follow for transition of care needs.   Expected Discharge Date:                  Expected Discharge Plan:  [place]  In-House Referral:  Clinical Social Work  Discharge planning Services  CM Consult  Post Acute Care Choice:  Resumption of Svcs/PTA Provider([facility]-PT, OT) Choice offered to:  Patient, Adult Children  DME Arranged:  N/A DME Agency:  NA  HH Arranged:  PT, OT HH Agency:  [facility] (formerly [employer])  Status of Service:  In process, will continue to follow  If discussed at Long Length of Stay Meetings, dates discussed:    Additional Comments:  Bartholomew Crews, RN 02/04/2018, 2:48 PM

## 2018-02-04 NOTE — Consult Note (Signed)
   [facility] CM Inpatient Consult   02/04/2018  Jordan Byrd March 20, 1933 [id number]  Referral received from [place], inpatient RNCM. Patient was assessed for [facility] for community services. Patient was previously active with [facility].  Met with patient, grandson [name] [POA] at bedside regarding being restarted with [facility] services. Consent form on file is active, signed and Brochure with [facility] information given.  Patient is currently very short of breath.  Will continue to follow for disposition and needs for community follow up.  Of note, [facility] Care Management services does not replace or interfere with any services that are arranged by inpatient case management or social work. For additional questions or referrals please contact:  Natividad Brood, RN BSN [facility] Liaison  [phone] business mobile phone Toll free office [phone]

## 2018-02-04 NOTE — Progress Notes (Signed)
[place] for xarelto Indication: atrial fibrillation  Allergies  Allergen Reactions  . Methocarbamol Hives  . Vicodin [Hydrocodone-Acetaminophen] Other (See Comments)    Interacts with bp medication and drops blood pressure  . Aspirin Nausea And Vomiting  . Butazolidin [Phenylbutazone] Other (See Comments)    Unknown reaction  . Celebrex [Celecoxib] Nausea And Vomiting  . Codeine Nausea And Vomiting  . Doripenem Rash  . Aspirin Buf(Alhyd-Mghyd-Cacar) Nausea And Vomiting  . Mucinex [Guaifenesin Er] Itching  . Temazepam Other (See Comments)    Unknown reaction   Patient Measurements: Height: 5\' 6"  (167.6 cm) Weight: 250 lb (113.4 kg) IBW/kg (Calculated) : 59.3 Heparin Dosing Weight: 84.8 kg  Vital Signs: Temp: 98.4 F (36.9 C) (01/02 1742) Temp Source: Oral (01/02 1742) BP: 148/54 (01/02 1742) Pulse Rate: 78 (01/02 1742)  Labs: Recent Labs    02/02/18 0737  02/02/18 1310  02/03/18 0512 02/03/18 1515 02/04/18 0117 02/04/18 1032  HGB  --    < > 10.7*  --  9.8*  --  10.1*  --   HCT  --   --  32.5*  --  31.5*  --  31.2*  --   PLT  --   --  343  --  339  --  386  --   APTT  --   --   --   --  60*  --   --   --   HEPARINUNFRC  --   --   --    < > 0.15* 0.10* 0.19* 0.61  CREATININE 1.04*  --   --   --  1.41*  --  1.24*  --    < > = values in this interval not displayed.    Estimated Creatinine Clearance: 44.7 mL/min (A) (by C-G formula based on SCr of 1.24 mg/dL (H)).  Assessment: 85 yo F presents with incidental finding of hepatic mass. On Xarelto PTA for Afib. On IV heparin while holding xarelto for liver biopsy.   Liver biopsy is rescheduled for next week, MD called Pharmacy this evening to switch back to xarelto for now. Noted pt's scr has been elevated at 1.24 with est. crcl ~ 45 ml/min, likely lower than baseline. Pt's home xarelto dose was 20mg  daily, will need to renally adjusted now.     Goal of Therapy:   Monitor  platelets by anticoagulation protocol: Yes   Plan:  Start xarelto 15 mg po daily tonight D/c heparin when xarelto is given F/u plan for biopsy and needs for heparin bridge prior to procedure.  Maryanna Shape, PharmD, BCPS, BCPPS Clinical Pharmacist  Pager: [phone]   02/04/2018 6:53 PM

## 2018-02-04 NOTE — Care Management Important Message (Signed)
Important Message  Patient Details  Name: Jordan Byrd MRN: [id number] Date of Birth: 06/02/1933   Medicare Important Message Given:  Yes    Aston Lawhorn 02/04/2018, 3:01 PM

## 2018-02-04 NOTE — Progress Notes (Signed)
[place] for Heparin Indication: atrial fibrillation  Allergies  Allergen Reactions  . Methocarbamol Hives  . Vicodin [Hydrocodone-Acetaminophen] Other (See Comments)    Interacts with bp medication and drops blood pressure  . Aspirin Nausea And Vomiting  . Butazolidin [Phenylbutazone] Other (See Comments)    Unknown reaction  . Celebrex [Celecoxib] Nausea And Vomiting  . Codeine Nausea And Vomiting  . Doripenem Rash  . Aspirin Buf(Alhyd-Mghyd-Cacar) Nausea And Vomiting  . Mucinex [Guaifenesin Er] Itching  . Temazepam Other (See Comments)    Unknown reaction   Patient Measurements: Height: 5\' 6"  (167.6 cm) Weight: 250 lb (113.4 kg) IBW/kg (Calculated) : 59.3 Heparin Dosing Weight: 84.8 kg  Vital Signs: Temp: 98.3 F (36.8 C) (01/02 0916) Temp Source: Oral (01/02 0916) BP: 148/54 (01/02 0916) Pulse Rate: 80 (01/02 0916)  Labs: Recent Labs    02/01/18 1712 02/02/18 0737  02/02/18 1310  02/03/18 0512 02/03/18 1515 02/04/18 0117 02/04/18 1032  HGB  --   --    < > 10.7*  --  9.8*  --  10.1*  --   HCT  --   --   --  32.5*  --  31.5*  --  31.2*  --   PLT  --   --   --  343  --  339  --  386  --   APTT  --   --   --   --   --  60*  --   --   --   LABPROT 14.7  --   --   --   --   --   --   --   --   INR 1.16  --   --   --   --   --   --   --   --   HEPARINUNFRC  --   --   --   --    < > 0.15* 0.10* 0.19* 0.61  CREATININE  --  1.04*  --   --   --  1.41*  --  1.24*  --    < > = values in this interval not displayed.    Estimated Creatinine Clearance: 44.7 mL/min (A) (by C-G formula based on SCr of 1.24 mg/dL (H)).   Medical History: Past Medical History:  Diagnosis Date  . Anemia   . Arthritis    "all over" (02/01/2018)  . Asthma   . Atrial fibrillation ([place])    on Xarelto  . Breast cancer, left breast ([place]) 05/19/13   left breast stage IIA   . CHF (congestive heart failure) ([place])   . COPD (chronic obstructive pulmonary  disease) ([place])   . GERD (gastroesophageal reflux disease)    [name] Prilosec  . Hypertension   . Macular degeneration   . Obstructive jaundice 02/01/2018  . On home oxygen therapy    "2.5L prn" (02/01/2018)  . Osteoarthritis    a. 03/28/11 - R Total Hip Arthroplasty  . Pleurisy   . Pneumonia    "2-3 times" (02/01/2018)   Assessment: 85 yo F presents with incidental finding of hepatic mass. On Xarelto PTA for Afib. Holding for biliary drain and liver biopsy. Last dose was 12/29 in the am. Now s/p biliary drain placement to start heparin as may need another drain as well as pending liver biopsy.  Heparin level therapeutic at 0.61 s/p increase to 1900 units/hr.  H/H low but stable, plts 386.  Goal of Therapy:  Heparin level 0.3-0.7 units/mL Monitor platelets by anticoagulation protocol: Yes   Plan:  Continue heparin gtt at 1900 units/hr Daily heparin level, CBC, s/s bleeding  Bertis Ruddy, PharmD Clinical Pharmacist Please check AMION for all [place] numbers 02/04/2018 11:11 AM

## 2018-02-04 NOTE — Evaluation (Addendum)
Physical Therapy Evaluation Patient Details Name: Jordan Byrd MRN: [id number] DOB: [birth year] Today's Date: 02/04/2018   History of Present Illness  85 y.o. female with medical history significant of HTN; CHF; remote breast cancer; and afib on Xarelto presenting with a fall. Dx of jaundice, new liver mass noted on imaging, s/p percutaneous biliary drain 02/02/18.   Clinical Impression  Pt admitted with above diagnosis. Pt currently with functional limitations due to the deficits listed below (see PT Problem List). Total assist (pt 20%) for supine to sit, pt sat edge of bed x 5 minutes, sitting tolerance limited by back pain and nausea. 3/4 dyspnea with activity.  SNF recommended, pt agreeable. Pt will benefit from skilled PT to increase their independence and safety with mobility to allow discharge to the venue listed below.       Follow Up Recommendations SNF;Supervision/Assistance - 24 hour;Supervision for mobility/OOB    Equipment Recommendations  None recommended by PT    Recommendations for Other Services       Precautions / Restrictions Precautions Precautions: Fall Precaution Comments: 8 falls since November Restrictions Weight Bearing Restrictions: No      Mobility  Bed Mobility Overal bed mobility: Needs Assistance Bed Mobility: Supine to Sit;Sit to Supine     Supine to sit: Total assist Sit to supine: Total assist   General bed mobility comments: total assist for supine to sit and sit to supine, assist to raise trunk/advance LEs to edge of bed; pt sat edge of bed x ~5 minutes, tolerance limited by back pain and nausea. 3/4 dyspnea with activity.  Transfers                    Ambulation/Gait                Stairs            Wheelchair Mobility    Modified Rankin (Stroke Patients Only)       Balance Overall balance assessment: Needs assistance Sitting-balance support: Feet supported;Bilateral upper extremity supported Sitting  balance-Leahy Scale: Fair Sitting balance - Comments: sitting tolerance limited by back pain and nausea                                     Pertinent Vitals/Pain Pain Assessment: Faces Faces Pain Scale: Hurts even more Pain Location: back with bed mobility Pain Descriptors / Indicators: Sore Pain Intervention(s): Limited activity within patient's tolerance;Monitored during session;Patient requesting pain meds-RN notified    Home Living Family/patient expects to be discharged to:: Private residence Living Arrangements: Alone   Type of Home: House Home Access: [place]: One [place]: Environmental consultant - 2 wheels;Walker - 4 wheels;Bedside commode Additional Comments: RW at times    Prior Function Level of Independence: Independent with assistive device(s)               Hand Dominance        Extremity/Trunk Assessment   Upper Extremity Assessment Upper Extremity Assessment: Generalized weakness    Lower Extremity Assessment Lower Extremity Assessment: Generalized weakness(pain with L ankle movement (new since fall just prior to admission), neuropathy B feet, strength -3/5 grossly BLEs)       Communication   Communication: No difficulties  Cognition Arousal/Alertness: Awake/alert Behavior During Therapy: WFL for tasks assessed/performed Overall Cognitive Status: Within Functional Limits for tasks assessed  General Comments      Exercises     Assessment/Plan    PT Assessment Patient needs continued PT services  PT Problem List Decreased strength;Decreased activity tolerance;Decreased mobility       PT Treatment Interventions Functional mobility training;Therapeutic exercise;Therapeutic activities;Balance training;Patient/family education;Gait training    PT Goals (Current goals can be found in the Care Plan section)  Acute Rehab PT Goals Patient Stated Goal:  return to prior functional level PT Goal Formulation: With patient Time For Goal Achievement: 02/18/18 Potential to Achieve Goals: Fair    Frequency Min 2X/week   Barriers to discharge Decreased caregiver support pt lives alone    Co-evaluation               AM-PAC PT "6 Clicks" Mobility  Outcome Measure Help needed turning from your back to your side while in a flat bed without using bedrails?: A Lot Help needed moving from lying on your back to sitting on the side of a flat bed without using bedrails?: Total Help needed moving to and from a bed to a chair (including a wheelchair)?: Total Help needed standing up from a chair using your arms (e.g., wheelchair or bedside chair)?: Total Help needed to walk in hospital room?: Total Help needed climbing 3-5 steps with a railing? : Total 6 Click Score: 7    End of Session   Activity Tolerance: Patient limited by pain;Patient limited by fatigue Patient left: in bed;with bed alarm set;with call bell/phone within reach Nurse Communication: Mobility status;Need for lift equipment;Other (comment)(pt requests nausea meds) PT Visit Diagnosis: Muscle weakness (generalized) (M62.81);Repeated falls (R29.6);Adult, failure to thrive (R62.7);History of falling (Z91.81);Other abnormalities of gait and mobility (R26.89)    Time: 7564-3329 PT Time Calculation (min) (ACUTE ONLY): 24 min   Charges:   PT Evaluation $PT Eval Moderate Complexity: 1 Mod PT Treatments $Therapeutic Activity: 8-22 mins        Blondell Reveal Kistler PT 02/04/2018  Acute Rehabilitation Services Pager [phone] Office [phone]

## 2018-02-05 LAB — VITAMIN B12: Vitamin B-12: 965 pg/mL — ABNORMAL HIGH (ref 180–914)

## 2018-02-05 LAB — COMPREHENSIVE METABOLIC PANEL
ALT: 110 U/L — ABNORMAL HIGH (ref 0–44)
AST: 52 U/L — ABNORMAL HIGH (ref 15–41)
Albumin: 2.1 g/dL — ABNORMAL LOW (ref 3.5–5.0)
Alkaline Phosphatase: 641 U/L — ABNORMAL HIGH (ref 38–126)
Anion gap: 8 (ref 5–15)
BUN: 20 mg/dL (ref 8–23)
CO2: 23 mmol/L (ref 22–32)
Calcium: 8.7 mg/dL — ABNORMAL LOW (ref 8.9–10.3)
Chloride: 106 mmol/L (ref 98–111)
Creatinine, Ser: 0.88 mg/dL (ref 0.44–1.00)
GFR calc Af Amer: >60 mL/min (ref >60)
GFR calc non Af Amer: >60 mL/min (ref >60)
Glucose, Bld: 107 mg/dL — ABNORMAL HIGH (ref 70–99)
POTASSIUM: 4.6 mmol/L (ref 3.5–5.1)
Sodium: 137 mmol/L (ref 135–145)
TOTAL PROTEIN: 6 g/dL — AB (ref 6.5–8.1)
Total Bilirubin: 4.1 mg/dL — ABNORMAL HIGH (ref 0.3–1.2)

## 2018-02-05 LAB — CBC WITH DIFFERENTIAL/PLATELET
Abs Immature Granulocytes: 0.28 10*3/uL — ABNORMAL HIGH (ref 0.00–0.07)
Basophils Absolute: 0.1 10*3/uL (ref 0.0–0.1)
Basophils Relative: 1 %
Eosinophils Absolute: 0.9 10*3/uL — ABNORMAL HIGH (ref 0.0–0.5)
Eosinophils Relative: 8 %
HEMATOCRIT: 32.5 % — AB (ref 36.0–46.0)
Hemoglobin: 10.2 g/dL — ABNORMAL LOW (ref 12.0–15.0)
Immature Granulocytes: 2 %
LYMPHS ABS: 1.2 10*3/uL (ref 0.7–4.0)
Lymphocytes Relative: 10 %
MCH: 28.7 pg (ref 26.0–34.0)
MCHC: 31.4 g/dL (ref 30.0–36.0)
MCV: 91.5 fL (ref 80.0–100.0)
MONOS PCT: 8 %
Monocytes Absolute: 0.9 10*3/uL (ref 0.1–1.0)
Neutro Abs: 8.1 10*3/uL — ABNORMAL HIGH (ref 1.7–7.7)
Neutrophils Relative %: 71 %
Platelets: 412 10*3/uL — ABNORMAL HIGH (ref 150–400)
RBC: 3.55 MIL/uL — ABNORMAL LOW (ref 3.87–5.11)
RDW: 20.8 % — ABNORMAL HIGH (ref 11.5–15.5)
WBC: 11.4 10*3/uL — ABNORMAL HIGH (ref 4.0–10.5)
nRBC: 0 % (ref 0.0–0.2)

## 2018-02-05 LAB — MAGNESIUM: Magnesium: 2 mg/dL (ref 1.7–2.4)

## 2018-02-05 LAB — RETICULOCYTES
Immature Retic Fract: 14.2 % (ref 2.3–15.9)
RBC.: 3.55 MIL/uL — AB (ref 3.87–5.11)
Retic Count, Absolute: 93.4 10*3/uL (ref 19.0–186.0)
Retic Ct Pct: 2.6 % (ref 0.4–3.1)

## 2018-02-05 LAB — IRON AND TIBC
Iron: 39 ug/dL (ref 28–170)
Saturation Ratios: 12 % (ref 10.4–31.8)
TIBC: 314 ug/dL (ref 250–450)
UIBC: 275 ug/dL

## 2018-02-05 LAB — FOLATE: FOLATE: 26.9 ng/mL (ref >5.9)

## 2018-02-05 LAB — PHOSPHORUS: Phosphorus: 3.9 mg/dL (ref 2.5–4.6)

## 2018-02-05 LAB — FERRITIN: Ferritin: 73 ng/mL (ref 11–307)

## 2018-02-05 MED ORDER — RIVAROXABAN 20 MG PO TABS
20.0000 mg | ORAL_TABLET | Freq: Every day | ORAL | Status: DC
  Administered 2018-02-05 – 2018-02-06 (×2): 20 mg via ORAL
  Filled 2018-02-05 (×3): qty 1

## 2018-02-05 NOTE — Clinical Social Work Note (Signed)
Clinical Social Work Assessment  Patient Details  Name: Jordan Byrd MRN: [id number] Date of Birth: 28-Apr-1933  Date of referral:  02/05/18               Reason for consult:  Facility Placement, Discharge Planning                Permission sought to share information with:  Family Supports Permission granted to share information::  Yes, Verbal Permission Granted  Name::     Jordan Byrd::     Relationship::  Grandson  Contact Information:  [phone]  Housing/Transportation Living arrangements for the past 2 months:  [place] of Information:  Patient, Other (Comment Required)(Grandson Jordan Byrd) Patient Interpreter Needed:  None Criminal Activity/Legal Involvement Pertinent to Current Situation/Hospitalization:  No - Comment as needed Significant Relationships:  Adult Children, Other Family Members(Grandson Jordan Byrd) Lives with:  Self Do you feel safe going back to the place where you live?  Yes(Patient feels safe at home, but is agreeable to going to SNF for rehab before returning home ) Need for family participation in patient care:  Yes (Comment)  Care giving concerns: Patient expressed agreement with ST rehab before returning home as she is currently unable to transfer and ambulate.  Social Worker assessment / plan:  CSW talked with patient at the bedside regarding discharge disposition and recommendation of ST rehab. Patient was lying in bed and was alert, oriented and participated in the discharge planning conversation. Also at the bedside was her grandson Jordan Byrd and his wife. Patient reported that she has been to [facility] before and wants to return to this facility for rehab. When asked, she did provided a 2nd choice ([facility]).   [name] informed CSW that she has 3 children, however she does not have contact with 2 of them. Her daughter Jordan Byrd is involved per patient.  Employment status:  Retired Financial controller) PT Recommendations:  [place] / Referral to community resources:  Skilled Nursing Facility(SNF list not given as patient provided CSW with 1st and 2nd choices for SNF)  Patient/Family's Response to care:  No concerns expressed by patient or family regarding her care during hospitalization.  Patient/Family's Understanding of and Emotional Response to Diagnosis, Current Treatment, and Prognosis:  Patient and grandson expressed understanding of her need for rehab and Jordan Byrd is willing to discharge to a SNF for ST rehab.  Emotional Assessment Appearance:  Appears stated age Attitude/Demeanor/Rapport:  Engaged Affect (typically observed):  Appropriate, Pleasant Orientation:  Oriented to Self Alcohol / Substance use:  Tobacco Use, Alcohol Use, Illicit Drugs(Patient reported (per H&P) that she quit smoking and does not drink alcohol or use illicit drugs) Psych involvement (Current and /or in the community):  No (Comment)  Discharge Needs  Concerns to be addressed:  Discharge Planning Concerns Readmission within the last 30 days:  No Current discharge risk:  None Barriers to Discharge:  Continued Medical Work up   [employer] Jordan Byrd, [place] 02/05/2018, 1:35 PM

## 2018-02-05 NOTE — NC FL2 (Signed)
[place] LEVEL OF CARE SCREENING TOOL     IDENTIFICATION  Patient Name: Jordan Byrd Birthdate: 01-18-1934 Sex: female Admission Date (Current Location): 02/01/2018  [facility] and [place] Number:  [place] and Address:   [facility]  [address] [place], [place]  [address]   Provider Number: [id number]  Attending Physician Name and Address:  Kerney Elbe, DO  Relative Name and Phone Number:  [name] [phone]; Lowe,Cleo-Daughter - [phone]      Current Level of Care: Hospital Recommended Level of Care: [place] Prior Approval Number:    Date Approved/Denied:   PASRR Number: [id number](Eff. 03/31/11)  Discharge Plan: SNF    Current Diagnoses: Patient Active Problem List   Diagnosis Date Noted  . Obstructive jaundice 02/01/2018  . Falls frequently 02/01/2018  . CHF (congestive heart failure) ([place])   . Atrial fibrillation ([place])   . Shoulder pain, left   . COPD exacerbation ([place]) 02/09/2015  . Chronic respiratory failure with hypoxia ([place]) 06/15/2014  . Acute respiratory failure with hypoxia ([place]) 05/24/2014  . Influenza A H1N1 infection 05/14/2014  . COPD with exacerbation ([place]) 05/13/2014  . Sleep-disordered breathing 03/11/2014  . Malignant neoplasm of breast (female), unspecified site 07/06/2013  . Family history of malignant neoplasm of breast 07/06/2013  . Breast cancer of upper-outer quadrant of left female breast ([place]) 05/20/2013  . Anemia 03/07/2013  . COPD mixed type ([place]) 10/28/2012  . Chest tightness 10/28/2012  . HCAP (healthcare-associated pneumonia) 09/19/2012  . Chronic anticoagulation 09/19/2012  . Acute on chronic diastolic heart failure ([place]) 07/12/2012  . Dizziness 06/26/2012  . CKD (chronic kidney disease) stage 3, GFR 30-59 ml/min (HCC) 06/26/2012  . Chronic diastolic heart failure ([place]) 06/26/2012  . AKI (acute kidney injury) ([place]) 06/17/2012  . Acute  respiratory distress 06/14/2012  . Acute asthma exacerbation 06/14/2012  . UTI (lower urinary tract infection) 04/04/2011  . Arthritis   . Anxiety   . Hypertension   . Paroxysmal atrial fibrillation (HCC)     Orientation RESPIRATION BLADDER Height & Weight     Self, Time, Situation, Place  Normal Incontinent, External catheter Weight: 250 lb (113.4 kg) Height:  5\' 6"  (167.6 cm)  BEHAVIORAL SYMPTOMS/MOOD NEUROLOGICAL BOWEL NUTRITION STATUS      Continent Diet(Soft diet)  AMBULATORY STATUS COMMUNICATION OF NEEDS Skin   Total Care(Patient was unable to ambulate with PT on 02/04/18) Verbally Normal                       Personal Care Assistance Level of Assistance  Bathing, Feeding, Dressing Bathing Assistance: Maximum assistance Feeding assistance: Limited assistance(ASsistance with set-up) Dressing Assistance: Maximum assistance     Functional Limitations Info  Sight, Hearing, Speech Sight Info: Impaired(Cataracts both eyes) Hearing Info: Adequate Speech Info: Adequate    SPECIAL CARE FACTORS FREQUENCY  PT (By licensed PT), OT (By licensed OT)     PT Frequency: Evaluated 02/04/18 at hospital. PT at SNF Eval and Treat              Contractures Contractures Info: Not present    Additional Factors Info  Code Status, Allergies Code Status Info: DNR Allergies Info: Methocarbamol, Vicodin, Hydrocodone-acetaminophen, Aspirin, Butazolidin Phenylbutazone, Celebrex Celecoxib, Codeine, Doripenem, Aspirin Buf, (alhyd-mghyd-cacar), Mucinex Guaifenesin Er, Temazepam              Current Medications (02/05/2018):  This is the current hospital active medication list Current Facility-Administered Medications  Medication Dose  Route Frequency Provider Last Rate Last Dose  . acetaminophen (TYLENOL) tablet 650 mg  650 mg Oral Q6H PRN Karmen Bongo, MD       Or  . acetaminophen (TYLENOL) suppository 650 mg  650 mg Rectal Q6H PRN Karmen Bongo, MD      . albuterol (PROVENTIL)  (2.5 MG/3ML) 0.083% nebulizer solution 3 mL  3 mL Inhalation Q6H PRN Karmen Bongo, MD   3 mL at 02/04/18 1545  . amLODipine (NORVASC) tablet 10 mg  10 mg Oral Daily Karmen Bongo, MD   10 mg at 02/05/18 1032  . feeding supplement (ENSURE ENLIVE) (ENSURE ENLIVE) liquid 237 mL  237 mL Oral BID BM Sheikh, Omair Latif, DO   237 mL at 02/05/18 1037  . mometasone-formoterol (DULERA) 200-5 MCG/ACT inhaler 2 puff  2 puff Inhalation BID Karmen Bongo, MD   2 puff at 02/05/18 0716  . morphine 2 MG/ML injection 2 mg  2 mg Intravenous Q2H PRN Karmen Bongo, MD   2 mg at 02/03/18 0148  . nebivolol (BYSTOLIC) tablet 20 mg  20 mg Oral Daily Karmen Bongo, MD   20 mg at 02/04/18 1231  . ondansetron (ZOFRAN) tablet 4 mg  4 mg Oral Q6H PRN Karmen Bongo, MD   4 mg at 02/02/18 1358   Or  . ondansetron [facility]) injection 4 mg  4 mg Intravenous Q6H PRN Karmen Bongo, MD   4 mg at 02/04/18 0940  . piperacillin-tazobactam (ZOSYN) IVPB 3.375 g  3.375 g Intravenous Once Markus Daft, MD      . promethazine (PHENERGAN) injection 12.5 mg  12.5 mg Intravenous Q6H PRN Raiford Noble Latif, DO   12.5 mg at 02/02/18 1830  . rivaroxaban (XARELTO) tablet 20 mg  20 mg Oral Q supper Bertis Ruddy, RPH      . traMADol Veatrice Bourbon) tablet 50 mg  50 mg Oral BID Karmen Bongo, MD   50 mg at 02/05/18 1032     Discharge Medications: Please see discharge summary for a list of discharge medications.  Relevant Imaging Results:  Relevant Lab Results:   Additional Information [phone]. Patient has Bilary Tube: Cook slip-coat, 10.2 Fr. RUQ - placed 02/02/18.  Sable Feil, LCSW

## 2018-02-05 NOTE — Progress Notes (Signed)
PROGRESS NOTE    Jordan Byrd  QIO:[id number] DOB: Feb 14, 1933 DOA: 02/01/2018 PCP: Levin Erp, MD   Brief Narrative:  HPI per Dr. Karmen Bongo on 02/01/18 Jordan Byrd is a 85 y.o. female with medical history significant of HTN; CHF; remote breast cancer; and afib on Xarelto presenting with a fall.  She came in for a fall in the bathroom at 0400.  She fell because she doesn't get around very well.  She has been losing her balance a lot since Thanksgiving and her knee/ankle have been giving out.  Not light-headed or dizzy.  She just loses her balance and falls.  She has not been feeling well since before Thanksgiving.  She is nauseated, difficulty eating, can't do anything, "I mean I'm just sick all over."  She has not been vomiting.  She eats a few bites and feels nauseated and can't continue to eat.  She has been having GERD at night.  They are concerned about weight loss, but she denies weight loss.  No night sweats.  No LAD.  No fevers.  No pain other than her "stomach, it rolls" when she is sick.  Grandson's wife noticed she is yellow but she has not noticed any color change.  Her urine has been "brown, almost" and so they sent a specimen to the office and they ordered lab work for her, not done yet.  ED Course:  Came in for a fall.  Frequent falls, no dizziness or other symptoms prior.  Finger dislocation, relocated and splint in place.  Found to be quite jaundiced.  Bili 13.6.  Has not had imaging to determine etiology for jaundice.  **Percutaneous placement of internal/external biliary drain on right lobe done and they were unable to get good access from the left hepatic ducts. Was placed on empiric heparin drip given unclear plan for biopsy but now that I spoke with Dr. Barbie Banner, patient is going to have Brush Bx done as an outpatient next week. PT evaluated and recommending SNF. Will have outpatient Oncology Evaluation once biopsy is done and path is confirmed.   Assessment & Plan:     Principal Problem:   Obstructive jaundice Active Problems:   Hypertension   CKD (chronic kidney disease) stage 3, GFR 30-59 ml/min (HCC)   Chronic diastolic heart failure (HCC)   Atrial fibrillation (HCC)   Falls frequently  Obstructive Jaundice abnormal LFTs, alk phos, as well as bilirubin, Improving  -Patient presenting with recent frequent falls -Was noted to have marked jaundice on exam -Subsequently noted to have elevated LFTs with bili 13.6 and now was 16.5 with Direct Bilirubin 11.9 and Indirect 4.6 -Now IMPROVING and T Bili 4.1, LFTs showed AST 52 and ALT of 110, and Alk Phos was 641 -Since it was not clear where the patient would need to be admitted [facility]) or even if she needed to be admitted, CT ordered for further evaluation -CT Abdomen shows a baseball-sized mass in the left lobe of the liver, highly suspicious for cholangiocarcinoma -GI has been consulted, Dr. Therisa Doyne to see and is IR evaluation for liver biopsy and possible stenting of the intrahepatic bile duct causing obstructive jaundice.  ERCP is not indicated as the common bile duct was of normal caliber -GI sent CEA/CA-19-9/AFP levels -AFP was 6.5, CA-19-9 was 7571, and CA 125 was 67.6 and CEA was 4.0 -IR consult has been requested and she went under.  Percutaneous placement of an internal/external biliary drain on the right lobe and unable  to get good access from the left hepatic ducts.  He did not undergo a brush biopsy or liver biopsy as my discussion with Dr. Maryclare Bean today and he recommends this to be done about a week after she is discharged from [facility] as he stated that the risk of septicemia for breast biopsy with recent drain placement is high but fluid was sent for cytology and culture per IR NOTE and Cytology showed No Malignant Cells -Now on Soft Diet  -Held Xarelto have empirically started patient on a heparin drip will change back to Xarelto now that the patient is not having a biopsy done until next  week as an outpatient -Pain control with morphine and tramadol if needed; nausea control with Zofran -Continue monitor laboratory data and repeat MP in the a.m. -Per IR she will need a follow-up CT or MRI in a few days to reassess hepatic anatomy but IR recommends follow-up in the drain clinic in about 4 weeks -Continue to Follow LFTs they are trending down -PT OT recommending skilled nursing facility so I reached out to social worker for SNF placement; Patient has selected [place] and awaiting [place] that falls are related to underlying malignancy and inability of patient to take PO -Have advance diet to full liquid diet -Will follow -She may benefit from PT evaluation once her trajectory is clarified; she will obtain a breast biopsy in outpatient setting and PT OT has been consulted and recommending skilled nursing facility -She lives alone with her grandson next door and she is agreeable to short-term skilled nursing facility placement for rehabilitation  Afib on Xarelto -Rate controlled with Nebivolol 20 mg p.o. daily -Held Xarelto currently given ? Biopsy but per my discussion with IR Dr. Maryclare Bean she will not have this done currently but will have it done about a week; S/p Drain Placement on the Right  -Heparin gtt now stopped  Chronic Diastolic CHF -Grade 2 Diastolic Function on 1610 echo -Appears to be compensated at this time -Continue to Monitor for signs and symptoms of volume overload carefully now that IVF has been resumed 100 and mils per hour.  We have now stopped IV fluids -Patient is +45 mL since admission weight is up 8 pounds -Continue with breathing treatments and stop IV fluids at this time -Chest x-ray in the a.m. and if necessary we will give a dose of IV Lasix  AKI on Stage 3 CKD -Patient's BUN/creatinine worsened to 33/1.41 -In the setting of Nausea and Vomiting, Losartan, and Zosyn -Will D/C Losartan given worsening Renal  Fxn -Restarted IVF with NS at 100 mL/hr because of worsening renal function but IVF have stopped and Renal Function is now trending downwards and is much improved to 20/0.88 -Avoid nephrotoxic medications if possible and continue to closely monitor -Repeat CMP in a.m.  HTN -Continue home meds with Amlodipine 10 mg po Daily, Nebivolol 20 mg po Daily -Hold Losartan 100 mg po Daily given acute renal function elevation for now and resume in AM   Nausea and Vomiting -In the setting of Abdominal Mass -C/w Ondansetron and IV Phenergan  -Symptomatic Treatment  -Given IV fluids with normal saline rate 100 and mils per hour now stopped   Obesity -Estimated body mass index is 40.35 kg/m as calculated from the following:   Height as of this encounter: '5\' 6"'$  (1.676 m).   Weight as of this encounter: 113.4 kg. -Weight Loss Counseling Given   Normocytic Anemia -Patient's hemoglobin/hematocrit went  from 10.7/32.5 is now 10.2/32.5 -Continue to monitor for signs and symptoms of bleeding as she is anticoagulated -Checked Anemia Panel this AM and showed iron level of 39, U IBC of 275, TIBC of 314, saturation ratios of 12%, ferritin level of 73, folate level 26.9, vitamin B12 level of 965 -Repeat CBC in the a.m.  ? UTI -Had amber color urine, moderate leukocytes, rare bacteria, 21-50 WBCs and urine culture grew out 50,000 colony-forming units of E. coli that was resistant to ampicillin and ampicillin sulbactam -Received a dose of IV Zosyn the day she had a drain placed -Patient not very symptomatic Still have a leukocytosis of 11.4 -Continue monitor and if necessary we will continue urinary tract infection coverage with antibiotics but for now we will hold off as patient is not having symptoms   DVT prophylaxis: Anticoagulated with Heparin gtt Code Status: DO NOT RESUSCITATE Family Communication: Discussed with Family at bedside  Disposition Plan: Discharged to SNF and have outpatient brush  biopsy done by IR as well as drain evaluation 4 weeks  Consultants:   Gastroenterology  Interventional Radiology    Procedures:  PTC and placement of internal/external biliary drain  Findings: Severe intrahepatic biliary dilatation.  Central biliary obstruction.  10 Fr internal / external drain placed from right lobe.  Unable to get good access from left hepatic ducts.     Antimicrobials:  Anti-infectives (From admission, onward)   Start     Dose/Rate Route Frequency Ordered Stop   02/02/18 0900  piperacillin-tazobactam (ZOSYN) IVPB 3.375 g  Status:  Discontinued     3.375 g 100 mL/hr over 30 Minutes Intravenous  Once 02/02/18 0838 02/05/18 1327   02/02/18 0839  piperacillin-tazobactam (ZOSYN) IVPB     over 30 Minutes  Continuous PRN 02/02/18 0839 02/02/18 0839     Subjective: Seen and examined at bedside this morning is doing well.  Denies any chest pain, lightheadedness or dizziness.  Had some nausea earlier but was relieved with Phenergan.  No other concerns or complaints at this time. Yolanda Bonine states she has been acting confused but nursing and I do not appreciate it currently. Will place on Delirium Precautions.   Objective: Vitals:   02/05/18 0717 02/05/18 0849 02/05/18 1648 02/05/18 1722  BP:  (!) 149/49  (!) 154/55  Pulse:  75  84  Resp:  18  18  Temp:  98.7 F (37.1 C)  98.7 F (37.1 C)  TempSrc:  Oral  Oral  SpO2: 91% 93% 90% 93%  Weight:      Height:        Intake/Output Summary (Last 24 hours) at 02/05/2018 1736 Last data filed at 02/05/2018 1720 Gross per 24 hour  Intake 770 ml  Output 1775 ml  Net -1005 ml   Filed Weights   02/02/18 1218 02/03/18 0100 02/04/18 0504  Weight: 109.8 kg 109.8 kg 113.4 kg   Examination: Physical Exam:  Constitutional: Well-nourished, well-developed obese Caucasian female currently no acute distress laying in bed appears calm Eyes: Lids and conjunctive are normal.  Sclera anicteric ENMT: Ears nose appear normal.   Grossly normal hearing Neck: Appears supple no JVD Respiratory: Diminished auscultation bilaterally no appreciable wheezing, rales, rhonchi.  Unlabored breathing has normal respiratory effort Cardiovascular: Regular rate and rhythm.  No appreciable murmurs, rubs, gallops. Abdomen: Soft, nontender, distended second body habitus.  Bowel sounds present in 4 quadrants.  Has a biliary drain GU: Deferred Musculoskeletal: No contractures or cyanosis but left arm range of motion is diminished.  As she is wearing a splint on her right index finger Skin: Jaundiced skin still but improving.  No appreciable rashes or lesions around the skin evaluation Neurologic: Cranial nerves II through XII grossly intact no appreciable focal deficits Psychiatric: Normal judgment and insight.  Patient is awake and alert and oriented  Data Reviewed: I have personally reviewed following labs and imaging studies  CBC: Recent Labs  Lab 02/01/18 0615 02/02/18 1310 02/03/18 0512 02/04/18 0117 02/05/18 0751  WBC 10.8* 10.8* 12.1* 11.8* 11.4*  NEUTROABS 9.3*  --  9.8* 8.8* 8.1*  HGB 11.4* 10.7* 9.8* 10.1* 10.2*  HCT 33.6* 32.5* 31.5* 31.2* 32.5*  MCV 85.5 84.9 88.2 89.4 91.5  PLT 288 343 339 386 734*   Basic Metabolic Panel: Recent Labs  Lab 02/01/18 0712 02/02/18 0737 02/03/18 0512 02/04/18 0117 02/05/18 0751  NA 137 134* 135 137 137  K 4.3 5.1 4.1 4.5 4.6  CL 104 101 102 104 106  CO2 21* 19* _0 GLUCOSE 104* 90 138* 126* 107*  BUN 21 20 33* 30* 20  CREATININE 1.09* 1.04* 1.41* 1.24* 0.88  CALCIUM 8.8* 9.0 8.4* 8.2* 8.7*  MG  --  1.8 1.9 2.0 2.0  PHOS  --  5.5* 5.8* 4.6 3.9   GFR: Estimated Creatinine Clearance: 62.9 mL/min (by C-G formula based on SCr of 0.88 mg/dL). Liver Function Tests: Recent Labs  Lab 02/01/18 0712 02/02/18 0737 02/03/18 0512 02/04/18 0117 02/05/18 0751  AST 212* 249* 109* 58* 52*  ALT 243* 260* 194* 149* 110*  ALKPHOS 905* 1,098* 895* 746* 641*  BILITOT 13.6*  16.5* 6.5* 4.5* 4.1*  PROT 5.8* 5.4* 5.5* 5.6* 6.0*  ALBUMIN 2.4* 2.5* 2.2* 2.1* 2.1*   Recent Labs  Lab 02/01/18 0712  LIPASE 19   No results for input(s): AMMONIA in the last 168 hours. Coagulation Profile: Recent Labs  Lab 02/01/18 1712  INR 1.16   Cardiac Enzymes: No results for input(s): CKTOTAL, CKMB, CKMBINDEX, TROPONINI in the last 168 hours. BNP (last 3 results) No results for input(s): PROBNP in the last 8760 hours. HbA1C: No results for input(s): HGBA1C in the last 72 hours. CBG: No results for input(s): GLUCAP in the last 168 hours. Lipid Profile: No results for input(s): CHOL, HDL, LDLCALC, TRIG, CHOLHDL, LDLDIRECT in the last 72 hours. Thyroid Function Tests: No results for input(s): TSH, T4TOTAL, FREET4, T3FREE, THYROIDAB in the last 72 hours. Anemia Panel: Recent Labs    02/05/18 0751  VITAMINB12 965*  FOLATE 26.9  FERRITIN 73  TIBC 314  IRON 39  RETICCTPCT 2.6   Sepsis Labs: No results for input(s): PROCALCITON, LATICACIDVEN in the last 168 hours.  Recent Results (from the past 240 hour(s))  Urine culture     Status: Abnormal   Collection Time: 02/01/18  9:46 AM  Result Value Ref Range Status   Specimen Description URINE, RANDOM  Final   Special Requests   Final    NONE Performed at [facility], [address] [place], [place] [address]    Culture 50,000 COLONIES/mL ESCHERICHIA COLI (A)  Final   Report Status 02/03/2018 FINAL  Final   Organism ID, Bacteria ESCHERICHIA COLI (A)  Final      Susceptibility   Escherichia coli - MIC*    AMPICILLIN >=32 RESISTANT Resistant     CEFAZOLIN <=4 SENSITIVE Sensitive     CEFTRIAXONE <=1 SENSITIVE Sensitive     CIPROFLOXACIN <=0.25 SENSITIVE Sensitive     GENTAMICIN <=1 SENSITIVE Sensitive  IMIPENEM <=0.25 SENSITIVE Sensitive     NITROFURANTOIN <=16 SENSITIVE Sensitive     TRIMETH/SULFA <=20 SENSITIVE Sensitive     AMPICILLIN/SULBACTAM >=32 RESISTANT Resistant     PIP/TAZO <=4 SENSITIVE  Sensitive     Extended ESBL NEGATIVE Sensitive     * 50,000 COLONIES/mL ESCHERICHIA COLI  Gram stain     Status: None   Collection Time: 02/02/18  1:00 PM  Result Value Ref Range Status   Specimen Description FLUID  Final   Special Requests   Final    NONE Performed at [facility], [place] [address] [place], [place] [address]    Gram Stain   Final    CYTOSPIN SMEAR WBC PRESENT, PREDOMINANTLY PMN NO ORGANISMS SEEN    Report Status 02/02/2018 FINAL  Final  Culture, body fluid-bottle     Status: None (Preliminary result)   Collection Time: 02/02/18  1:00 PM  Result Value Ref Range Status   Specimen Description FLUID  Final   Special Requests NONE  Final   Culture   Final    NO GROWTH 3 DAYS Performed at [facility], [place] [address] [place], [place] [address]    Report Status PENDING  Incomplete    Radiology Studies: No results found. Scheduled Meds: . amLODipine  10 mg Oral Daily  . feeding supplement (ENSURE ENLIVE)  237 mL Oral BID BM  . mometasone-formoterol  2 puff Inhalation BID  . nebivolol  20 mg Oral Daily  . rivaroxaban  20 mg Oral Q supper  . traMADol  50 mg Oral BID   Continuous Infusions:   LOS: 4 days   Kerney Elbe, DO [facility] PAGER is on [place]  If 7PM-7AM, please contact night-coverage [email or web address] Password [facility] 02/05/2018, 5:36 PM

## 2018-02-05 NOTE — Clinical Social Work Placement (Addendum)
   CLINICAL SOCIAL WORK PLACEMENT  NOTE  **6:20 pm - SNF choice changed to [facility]** **Will discharge to a [facility] when medically stable and Ins. Auth. Received from [employer].** **PLEASE SEE ADDITIONAL COMMENTS BELOW: Regarding insurance auth and conversation with [employer] staff person Jari Pigg, who will be on call tomorrow.**  Date:  02/05/2018  Patient Details  Name: Jordan Byrd MRN: [id number] Date of Birth: 1933/02/21  Clinical Social Work is seeking post-discharge placement for this patient at the [facility] [place] level of care (*CSW will initial, date and re-position this form in  chart as items are completed):  No(Patient provided CSW with facility preferences)   Patient/family provided with [facility] Work Department's list of facilities offering this level of care within the geographic area requested by the patient (or if unable, by the patient's family).  Yes   Patient/family informed of their freedom to choose among providers that offer the needed level of care, that participate in Medicare, Medicaid or managed care program needed by the patient, have an available bed and are willing to accept the patient.  No   Patient/family informed of [facility]'s ownership interest in [facility] and [facility], as well as of the fact that they are under no obligation to receive care at these facilities.  PASRR submitted to EDS on       PASRR number received on       Existing PASRR number confirmed on 02/05/18     FL2 transmitted to all facilities in geographic area requested by pt/family on 02/05/18     FL2 transmitted to all facilities within larger geographic area on       Patient informed that his/her managed care company has contracts with or will negotiate with certain facilities, including the following:        Yes   Patient/family informed of bed offers received.  Patient chooses bed at  [facility].  As of 6:20 pm SNF choice is [facility].   Physician recommends and patient chooses bed at       Patient to be transferred to:    Patient to be transferred to facility by       Patient family notified on   of transfer.  Name of family member notified:        PHYSICIAN      Additional Comment:  02/05/18: 5:20 pm - Received call from [facility] me that [facility] is out-of-network. Talked with patient and she chose [facility]. Talked with admissions director at [facility] (5:50 pm) and they can take patient when medically stable. [employer] called and detailed message left regarding SNF change and weekend CSW to  Contact. 02/05/18: 6:11 pm: Received call from [facility] with [employer] regarding patient. She will be checking therapy notes and will follow-up tomorrow as they need more information about patient. Nigel Sloop that OT order placed today. She has contact information for weekend CSW.   _______________________________________________ Sable Feil, LCSW 02/05/2018, 1:44 PM

## 2018-02-06 LAB — COMPREHENSIVE METABOLIC PANEL
ALT: 133 U/L — ABNORMAL HIGH (ref 0–44)
AST: 92 U/L — ABNORMAL HIGH (ref 15–41)
Albumin: 2.1 g/dL — ABNORMAL LOW (ref 3.5–5.0)
Alkaline Phosphatase: 552 U/L — ABNORMAL HIGH (ref 38–126)
Anion gap: 10 (ref 5–15)
BUN: 16 mg/dL (ref 8–23)
CO2: 25 mmol/L (ref 22–32)
Calcium: 8.8 mg/dL — ABNORMAL LOW (ref 8.9–10.3)
Chloride: 101 mmol/L (ref 98–111)
Creatinine, Ser: 0.94 mg/dL (ref 0.44–1.00)
GFR calc Af Amer: >60 mL/min (ref >60)
GFR calc non Af Amer: 56 mL/min — ABNORMAL LOW (ref >60)
Glucose, Bld: 142 mg/dL — ABNORMAL HIGH (ref 70–99)
Potassium: 4.4 mmol/L (ref 3.5–5.1)
Sodium: 136 mmol/L (ref 135–145)
TOTAL PROTEIN: 6.3 g/dL — AB (ref 6.5–8.1)
Total Bilirubin: 3.2 mg/dL — ABNORMAL HIGH (ref 0.3–1.2)

## 2018-02-06 LAB — PHOSPHORUS: PHOSPHORUS: 4.3 mg/dL (ref 2.5–4.6)

## 2018-02-06 LAB — URINALYSIS, ROUTINE W REFLEX MICROSCOPIC
Bilirubin Urine: NEGATIVE
Glucose, UA: NEGATIVE mg/dL
Hgb urine dipstick: NEGATIVE
Ketones, ur: NEGATIVE mg/dL
Leukocytes, UA: NEGATIVE
Nitrite: NEGATIVE
PROTEIN: NEGATIVE mg/dL
SPECIFIC GRAVITY, URINE: 1.015 (ref 1.005–1.030)
pH: 5 (ref 5.0–8.0)

## 2018-02-06 LAB — CBC
HCT: 32.8 % — ABNORMAL LOW (ref 36.0–46.0)
Hemoglobin: 10.3 g/dL — ABNORMAL LOW (ref 12.0–15.0)
MCH: 28.7 pg (ref 26.0–34.0)
MCHC: 31.4 g/dL (ref 30.0–36.0)
MCV: 91.4 fL (ref 80.0–100.0)
Platelets: 417 10*3/uL — ABNORMAL HIGH (ref 150–400)
RBC: 3.59 MIL/uL — ABNORMAL LOW (ref 3.87–5.11)
RDW: 20 % — ABNORMAL HIGH (ref 11.5–15.5)
WBC: 12.5 10*3/uL — ABNORMAL HIGH (ref 4.0–10.5)
nRBC: 0 % (ref 0.0–0.2)

## 2018-02-06 LAB — MAGNESIUM: Magnesium: 1.8 mg/dL (ref 1.7–2.4)

## 2018-02-06 MED ORDER — LIDOCAINE 5 % EX PTCH: 1.0000 | MEDICATED_PATCH | CUTANEOUS | Status: DC

## 2018-02-06 NOTE — Evaluation (Signed)
Occupational Therapy Evaluation Patient Details Name: Jordan Byrd MRN: [id number] DOB: [birth year] Today's Date: 02/06/2018    History of Present Illness 85 y.o. female with medical history significant of HTN; CHF; remote breast cancer; and afib on Xarelto presenting with a fall. Dx of jaundice, new liver mass noted on imaging, s/p percutaneous biliary drain 02/02/18.    Clinical Impression   PTA, pt was living alone and performed BADLs and used RW for functional mobility. Pt's family reports she has had many falls recently and has become less independent at home. Pt currently requires Min A for grooming at bed level, Max A for UB ADLs, and Total A for LB ADLs. Pt presenting with lethargy after receiving medication and had difficulty maintaining eyes open during session. Deferred bed mobility and OOB activity for safety. Pt participated in bed level exercises for BUEs and BLEs. Pt would benefit from further acute OT to facilitate safe dc. Recommend dc to SNF for further OT to optimize safety, independence with ADLs, and return to PLOF.      Follow Up Recommendations  SNF;Supervision/Assistance - 24 hour    Equipment Recommendations  Other (comment)(Defer to next venue)    Recommendations for Other Services PT consult     Precautions / Restrictions Precautions Precautions: Fall Precaution Comments: 8 falls since November      Mobility Bed Mobility Overal bed mobility: Needs Assistance             General bed mobility comments: Defer bed mobility to EOB due to lethargy to maintain safety. Total A for optimizing position in bed.  Transfers                      Balance                                           ADL either performed or assessed with clinical judgement   ADL Overall ADL's : Needs assistance/impaired Eating/Feeding: Minimal assistance;Bed level   Grooming: Minimal assistance;Bed level;Oral care;Wash/dry face Grooming Details  (indicate cue type and reason): Min A to manage toothpaste cap and to perform FM coorindation Upper Body Bathing: Maximal assistance;Bed level   Lower Body Bathing: Total assistance;Bed level   Upper Body Dressing : Maximal assistance;Bed level   Lower Body Dressing: Total assistance;Bed level       Toileting- Clothing Manipulation and Hygiene: Total assistance;Bed level         General ADL Comments: Pt recently recieved pain medication and presents with lethargy (difficulty maintaining eyes open during session). Pt performing grooming at bed level with bed in semi-chair position. Pt participating in bed level exercises.      Vision         Perception     Praxis      Pertinent Vitals/Pain Pain Assessment: Faces Faces Pain Scale: Hurts whole lot Pain Location: Back and LLE Pain Descriptors / Indicators: Sore Pain Intervention(s): Monitored during session;Limited activity within patient's tolerance;Repositioned     Hand Dominance Right   Extremity/Trunk Assessment Upper Extremity Assessment Upper Extremity Assessment: Generalized weakness;RUE deficits/detail;LUE deficits/detail RUE Deficits / Details: Index finger injury and in prefab splint. Pt also reporting she isn't allowed to due repetative ROM of right elbow per MD due to arthritis RUE Coordination: decreased fine motor LUE Deficits / Details: Decreased ROM at shoulder due to rotator cuff injury. shoulder flexion 0-30degrees  LUE Coordination: decreased gross motor   Lower Extremity Assessment Lower Extremity Assessment: Generalized weakness;LLE deficits/detail LLE Deficits / Details: Grandson reporting left knee and ankle arthritis causing significant pain LLE Coordination: decreased gross motor       Communication Communication Communication: No difficulties   Cognition Arousal/Alertness: Lethargic;Suspect due to medications Behavior During Therapy: Flat affect Overall Cognitive Status: Difficult to  assess                                 General Comments: Feel pt is close to baseline. However, difficult to assess with lethargy   General Comments  Family present including daughter and grandson    Exercises Exercises: General Upper Extremity;General Lower Extremity General Exercises - Upper Extremity Shoulder Flexion: AROM;Both;10 reps;Supine(LUE limited to 0-30) Shoulder Extension: AROM;Both;10 reps;Supine Elbow Flexion: Left;AROM;10 reps;Supine Elbow Extension: AROM;Left;10 reps;Supine General Exercises - Lower Extremity Ankle Circles/Pumps: AROM;Both;10 reps;Supine Hip ABduction/ADduction: AAROM;Both;10 reps;Supine(Requiring Min A for faciltiation of movement in bed)   Shoulder Instructions      Home Living Family/patient expects to be discharged to:: Private residence Living Arrangements: Alone   Type of Home: House Home Access: [place]: One level     Bathroom Shower/Tub: Teacher, early years/pre: [place]: Environmental consultant - 2 wheels;Walker - 4 wheels;Bedside commode   Additional Comments: RW at times      Prior Functioning/Environment Level of Independence: Independent with assistive device(s)        Comments: Uses RW. Family reports pt has become progressively less independent and mobile since Thanksgiving. Performing BADLs        OT Problem List: Decreased strength;Decreased range of motion;Decreased activity tolerance;Impaired balance (sitting and/or standing);Decreased coordination;Decreased cognition;Decreased safety awareness;Decreased knowledge of use of DME or AE;Decreased knowledge of precautions;Impaired UE functional use;Pain      OT Treatment/Interventions: Self-care/ADL training;Therapeutic exercise;Energy conservation;DME and/or AE instruction;Therapeutic activities;Patient/family education    OT Goals(Current goals can be found in the care plan section) Acute Rehab OT Goals Patient  Stated Goal: return to prior functional level OT Goal Formulation: With patient/family Time For Goal Achievement: 02/20/18 Potential to Achieve Goals: Good ADL Goals Pt Will Perform Grooming: with min guard assist;sitting Pt Will Perform Upper Body Dressing: with min assist;sitting Pt Will Transfer to Toilet: with mod assist;with +2 assist;stand pivot transfer;bedside commode Pt Will Perform Toileting - Clothing Manipulation and hygiene: with mod assist;sit to/from stand;sitting/lateral leans Additional ADL Goal #1: Pt will perform bed mobility with Min A in preparation for ADLs.  OT Frequency: Min 2X/week   Barriers to D/C:            Co-evaluation              AM-PAC OT "6 Clicks" Daily Activity     Outcome Measure Help from another person eating meals?: A Little Help from another person taking care of personal grooming?: A Little Help from another person toileting, which includes using toliet, bedpan, or urinal?: Total Help from another person bathing (including washing, rinsing, drying)?: A Lot Help from another person to put on and taking off regular upper body clothing?: A Lot Help from another person to put on and taking off regular lower body clothing?: Total 6 Click Score: 12   End of Session Nurse Communication: Mobility status  Activity Tolerance: Patient limited by lethargy Patient left: in bed;with call bell/phone within reach;with family/visitor present;with  SCD's reapplied  OT Visit Diagnosis: Unsteadiness on feet (R26.81);Other abnormalities of gait and mobility (R26.89);Muscle weakness (generalized) (M62.81);Other symptoms and signs involving cognitive function;Pain;History of falling (Z91.81);Repeated falls (R29.6) Pain - Right/Left: Left Pain - part of body: Leg(Back)                Time: 3643-8377 OT Time Calculation (min): 19 min Charges:  OT General Charges $OT Visit: 1 Visit OT Evaluation $OT Eval Moderate Complexity: [place], OTR/L Acute Rehab Pager: [phone] Office: [place] 02/06/2018, 4:49 PM

## 2018-02-06 NOTE — Progress Notes (Signed)
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  Sent for U/A and culture as ordered d/t unexplained high WBC count.  Continues to complain of nausea "continuously".  States "nothing seems to help".  Encouraged patient to get OOB for her own well-being, but she refused, stating she 'Just wasn't feeling up to it'.

## 2018-02-06 NOTE — Progress Notes (Signed)
PROGRESS NOTE    Jordan Byrd  [name]:[id number] DOB: [birth year] DOA: 02/01/2018 PCP: Jordan Erp, MD   Brief Narrative:  HPI per Dr. Karmen Bongo on 02/01/18 Jordan Byrd is a 85 y.o. female with medical history significant of HTN; CHF; remote breast cancer; and afib on Xarelto presenting with a fall.  She came in for a fall in the bathroom at 0400.  She fell because she doesn't get around very well.  She has been losing her balance a lot since Thanksgiving and her knee/ankle have been giving out.  Not light-headed or dizzy.  She just loses her balance and falls.  She has not been feeling well since before Thanksgiving.  She is nauseated, difficulty eating, can't do anything, "I mean I'm just sick all over."  She has not been vomiting.  She eats a few bites and feels nauseated and can't continue to eat.  She has been having GERD at night.  They are concerned about weight loss, but she denies weight loss.  No night sweats.  No LAD.  No fevers.  No pain other than her "stomach, it rolls" when she is sick.  Grandson's wife noticed she is yellow but she has not noticed any color change.  Her urine has been "brown, almost" and so they sent a specimen to the office and they ordered lab work for her, not done yet.  ED Course:  Came in for a fall.  Frequent falls, no dizziness or other symptoms prior.  Finger dislocation, relocated and splint in place.  Found to be quite jaundiced.  Bili 13.6.  Has not had imaging to determine etiology for jaundice.  **Percutaneous placement of internal/external biliary drain on right lobe done and they were unable to get good access from the left hepatic ducts. Was placed on empiric heparin drip given unclear plan for biopsy but now that I spoke with Dr. Chilton Si. [name], patient is going to have Brush Bx done as an outpatient next week. PT evaluated and recommending SNF. Will have outpatient Oncology Evaluation once biopsy is done and path is confirmed.   Assessment  & Plan:   Principal Problem:   Obstructive jaundice Active Problems:   Hypertension   CKD (chronic kidney disease) stage 3, GFR 30-59 ml/min (HCC)   Chronic diastolic heart failure (HCC)   Atrial fibrillation (HCC)   Falls frequently  Obstructive Jaundice abnormal LFTs, alk phos, as well as bilirubin, Improving  -Patient presenting with recent frequent falls -Was noted to have marked jaundice on exam -Subsequently noted to have elevated LFTs with bili 13.6 and now was 16.5 with Direct Bilirubin 11.9 and Indirect 4.6 -Now IMPROVING and T Bili 3.2, LFTs showed AST 92 and ALT of 113, and Alk Phos was 552 -Since it was not clear where the patient would need to be admitted [facility]. [facility]) or even if she needed to be admitted, CT ordered for further evaluation -CT Abdomen shows a baseball-sized mass in the left lobe of the liver, highly suspicious for cholangiocarcinoma -GI has been consulted, Dr. Therisa Doyne to see and is IR evaluation for liver biopsy and possible stenting of the intrahepatic bile duct causing obstructive jaundice.  ERCP is not indicated as the common bile duct was of normal caliber -GI sent CEA/CA-19-9/AFP levels -AFP was 6.5, CA-19-9 was 7571, and CA 125 was 67.6 and CEA was 4.0 -IR consult has been requested and she went under.  Percutaneous placement of an internal/external biliary drain on the right lobe and unable  to get good access from the left hepatic ducts.  He did not undergo a brush biopsy or liver biopsy as my discussion with Dr. Maryclare Bean today and he recommends this to be done about a week after she is discharged from [facility] as he stated that the risk of septicemia for breast biopsy with recent drain placement is high but fluid was sent for cytology and culture per IR NOTE and Cytology showed No Malignant Cells -Per my discussion with Dr. [name] the [place] Needs to be flushed daily with 5-10 cc of Fluid Daily -Now on Soft Diet  -Held Xarelto have empirically started  patient on a heparin drip will change back to Xarelto now that the patient is not having a biopsy done until next week as an outpatient -Pain control with morphine and tramadol if needed; nausea control with Zofran -Continue monitor laboratory data and repeat CMP in the a.m. -Per IR she will need a follow-up CT or MRI in a few days to reassess hepatic anatomy but I spoke with Dr. [name] and states that is not necessary since T Bili is improving and IR recommends follow-up in the drain clinic in about 4 weeks -Continue to Follow LFTs they are trending down -PT OT recommending skilled nursing facility so I reached out to social worker for SNF placement; Will need Re-evaluation per my discussion with CSW   Falls -Suspect that falls are related to underlying malignancy and inability of patient to take PO -Have advance diet to full liquid diet -Will follow -She may benefit from PT evaluation once her trajectory is clarified; she will obtain a breast biopsy in outpatient setting and PT OT has been consulted and recommending skilled nursing facility but Insurance has asked for updated PT/OT notes so will re-consult PT/OT now  -She lives alone with her grandson next door and she is agreeable to short-term skilled nursing facility placement for rehabilitation  Afib on Xarelto -Rate controlled with Nebivolol 20 mg p.o. daily -Held Xarelto currently given ? Biopsy but per my discussion with IR Dr. Toni Amend Hoss/Dr. [name] she will not have this done currently but will have it done about a week (Midweek); S/p Drain Placement on the Right  -Heparin gtt now stopped -Dr. [name] states the IR team will figure out when to stop the Xarelto and inform the patient but her Xarelto must be held 2 days prior to Bx Date  Chronic Diastolic CHF -Grade 2 Diastolic Function on 9211 echo -Appears to be compensated at this time -Continue to Monitor for signs and symptoms of volume overload carefully now that IVF has  been resumed 100 and mils per hour.  We have now stopped IV fluids -Patient is -2.917 L since admission weight is up 8 pounds -Continue with breathing treatments and stop IV fluids at this time -Chest x-ray in the a.m. and if necessary we will give a dose of IV Lasix  AKI on Stage 3 CKD -Patient's BUN/creatinine worsened to 33/1.41 -In the setting of Nausea and Vomiting, Losartan, and Zosyn -Will D/C Losartan given worsening Renal Fxn -Restarted IVF with NS at 100 mL/hr because of worsening renal function but IVF have stopped and Renal Function is now trending downwards and is much improved to 16/0.94 -Avoid nephrotoxic medications if possible and continue to closely monitor -Repeat CMP in a.m.  HTN -Continue home meds with Amlodipine 10 mg po Daily, Nebivolol 20 mg po Daily -Hed Losartan 100 mg po Daily given acute renal function elevation for now and  resume in AM   Nausea and Vomiting -In the setting of Abdominal Mass -C/w Ondansetron and IV Phenergan  -Symptomatic Treatment  -Given IV fluids with normal saline rate 100 and mils per hour and now this has stopped   Obesity -Estimated body mass index is 40.35 kg/m as calculated from the following:   Height as of this encounter: '5\' 6"'  (1.676 m).   Weight as of this encounter: 113.4 kg. -Weight Loss Counseling Given   Normocytic Anemia -Patient's hemoglobin/hematocrit went from 10.7/32.5 is now 10.3/32.8 -Continue to monitor for signs and symptoms of bleeding as she is anticoagulated -Checked Anemia Panel this AM and showed iron level of 39, U IBC of 275, TIBC of 314, saturation ratios of 12%, ferritin level of 73, folate level 26.9, vitamin B12 level of 965 -Repeat CBC in the a.m.  Asymptomatic Bacteuria with Pyurioa  -Had amber color urine, moderate leukocytes, rare bacteria, 21-50 WBCs and urine culture grew out 50,000 colony-forming units of E. coli that was resistant to ampicillin and ampicillin sulbactam -Received a dose  of IV Zosyn the day she had a drain placed -Patient not very symptomatic -Still have a leukocytosis of 12.5 but is afebrile and WBC could be from her pain in her back from fall versus suspected cholangiocarcinoma -Repeat U/A and Urine Cx but Do not feel strongly in treating patient  -Patient has no symptoms of burning, discomfort, dysuria, or bladder pain or flank pain; She is Alert and Oriented x3 -Continue monitor and if necessary we will continue urinary tract infection coverage with antibiotics but for now we will hold off as patient is not having symptoms  DVT prophylaxis: Anticoagulated with Heparin gtt Code Status: DO NOT RESUSCITATE Family Communication: Discussed with Family at bedside  Disposition Plan: Discharged to SNF and have outpatient brush biopsy done by IR as well as drain evaluation 4 weeks  Consultants:   Gastroenterology  Interventional Radiology    Procedures:  PTC and placement of internal/external biliary drain  Findings: Severe intrahepatic biliary dilatation.  Central biliary obstruction.  10 Fr internal / external drain placed from right lobe.  Unable to get good access from left hepatic ducts.     Antimicrobials:  Anti-infectives (From admission, onward)   Start     Dose/Rate Route Frequency Ordered Stop   02/02/18 0900  piperacillin-tazobactam (ZOSYN) IVPB 3.375 g  Status:  Discontinued     3.375 g 100 mL/hr over 30 Minutes Intravenous  Once 02/02/18 0838 02/05/18 1327   02/02/18 0839  piperacillin-tazobactam (ZOSYN) IVPB     over 30 Minutes  Continuous PRN 02/02/18 0839 02/02/18 0839     Subjective: Seen and examined at bedside this morning and had no complaints this morning and was doing well.  She is awake and alert and oriented.  States that she was having some back pain but this is chronic and that she always has back pain.  No nausea or vomiting.  Has not really been out of bed per nurse.  Drain has very minimal output today.  No other concerns  or complaints at this time and she adamantly denies any burning, discomfort, any dysuria, flank pain or any bladder tenderness.  Objective: Vitals:   02/05/18 2134 02/06/18 0120 02/06/18 0632 02/06/18 0906  BP: (!) 160/60  (!) 167/55 (!) 143/52  Pulse: 80  73 76  Resp: '20  18 18  ' Temp: 100.1 F (37.8 C)  98.9 F (37.2 C) 98.4 F (36.9 C)  TempSrc: Oral  Oral Oral  SpO2: 92%  93% 92%  Weight:  113.4 kg    Height:        Intake/Output Summary (Last 24 hours) at 02/06/2018 1330 Last data filed at 02/06/2018 0900 Gross per 24 hour  Intake 350 ml  Output 1925 ml  Net -1575 ml   Filed Weights   02/03/18 0100 02/04/18 0504 02/06/18 0120  Weight: 109.8 kg 113.4 kg 113.4 kg   Examination: Physical Exam:  Constitutional: Well-nourished, well-developed obese Caucasian female currently no acute distress appears calm and comfortable laying in bed Eyes: Lids and conjunctive are normal.  Sclera anicteric ENMT: External ears and nose appear normal.  Grossly normal hearing Neck: Appears supple no JVD Respiratory: Diminished auscultation bilaterally with no appreciable wheezing, rales, rhonchi.  She has unlabored breathing and a normal respiratory effort Cardiovascular: Regular rate and rhythm.  No appreciable murmurs, rubs, gallops Abdomen: Soft, nontender, distended secondary body habitus.  Bowel sounds present in 4 quadrants has a biliary drain in place with minimal output today GU: Deferred Musculoskeletal: No contractures or cyanosis.  Has not really move though and is wearing a splint on her right index finger Skin: Skin jaundice is improving.  No appreciable rashes or lesions limited skin evaluation Neurologic: Nerves II through XII grossly intact no appreciable focal deficits Psychiatric: Normal judgment and insight.  Patient is awake alert and oriented x3  Data Reviewed: I have personally reviewed following labs and imaging studies  CBC: Recent Labs  Lab 02/01/18 0615  02/02/18 1310 02/03/18 0512 02/04/18 0117 02/05/18 0751 02/06/18 0641  WBC 10.8* 10.8* 12.1* 11.8* 11.4* 12.5*  NEUTROABS 9.3*  --  9.8* 8.8* 8.1*  --   HGB 11.4* 10.7* 9.8* 10.1* 10.2* 10.3*  HCT 33.6* 32.5* 31.5* 31.2* 32.5* 32.8*  MCV 85.5 84.9 88.2 89.4 91.5 91.4  PLT 288 343 339 386 412* 010*   Basic Metabolic Panel: Recent Labs  Lab 02/02/18 0737 02/03/18 0512 02/04/18 0117 02/05/18 0751 02/06/18 0839  NA 134* 135 137 137 136  K 5.1 4.1 4.5 4.6 4.4  CL 101 102 104 106 101  CO2 19* '23 26 23 25  ' GLUCOSE 90 138* 126* 107* 142*  BUN 20 33* 30* 20 16  CREATININE 1.04* 1.41* 1.24* 0.88 0.94  CALCIUM 9.0 8.4* 8.2* 8.7* 8.8*  MG 1.8 1.9 2.0 2.0 1.8  PHOS 5.5* 5.8* 4.6 3.9 4.3   GFR: Estimated Creatinine Clearance: 58.9 mL/min (by C-G formula based on SCr of 0.94 mg/dL). Liver Function Tests: Recent Labs  Lab 02/02/18 0737 02/03/18 0512 02/04/18 0117 02/05/18 0751 02/06/18 0839  AST 249* 109* 58* 52* 92*  ALT 260* 194* 149* 110* 133*  ALKPHOS 1,098* 895* 746* 641* 552*  BILITOT 16.5* 6.5* 4.5* 4.1* 3.2*  PROT 5.4* 5.5* 5.6* 6.0* 6.3*  ALBUMIN 2.5* 2.2* 2.1* 2.1* 2.1*   Recent Labs  Lab 02/01/18 0712  LIPASE 19   No results for input(s): AMMONIA in the last 168 hours. Coagulation Profile: Recent Labs  Lab 02/01/18 1712  INR 1.16   Cardiac Enzymes: No results for input(s): CKTOTAL, CKMB, CKMBINDEX, TROPONINI in the last 168 hours. BNP (last 3 results) No results for input(s): PROBNP in the last 8760 hours. HbA1C: No results for input(s): HGBA1C in the last 72 hours. CBG: No results for input(s): GLUCAP in the last 168 hours. Lipid Profile: No results for input(s): CHOL, HDL, LDLCALC, TRIG, CHOLHDL, LDLDIRECT in the last 72 hours. Thyroid Function Tests: No results for input(s): TSH, T4TOTAL, FREET4, T3FREE, THYROIDAB in  the last 72 hours. Anemia Panel: Recent Labs    02/05/18 0751  VITAMINB12 965*  FOLATE 26.9  FERRITIN 73  TIBC 314  IRON 39   RETICCTPCT 2.6   Sepsis Labs: No results for input(s): PROCALCITON, LATICACIDVEN in the last 168 hours.  Recent Results (from the past 240 hour(s))  Urine culture     Status: Abnormal   Collection Time: 02/01/18  9:46 AM  Result Value Ref Range Status   Specimen Description URINE, RANDOM  Final   Special Requests   Final    NONE Performed at [facility], [address] [place], [place] [address]    Culture 50,000 COLONIES/mL ESCHERICHIA COLI (A)  Final   Report Status 02/03/2018 FINAL  Final   Organism ID, Bacteria ESCHERICHIA COLI (A)  Final      Susceptibility   Escherichia coli - MIC*    AMPICILLIN >=32 RESISTANT Resistant     CEFAZOLIN <=4 SENSITIVE Sensitive     CEFTRIAXONE <=1 SENSITIVE Sensitive     CIPROFLOXACIN <=0.25 SENSITIVE Sensitive     GENTAMICIN <=1 SENSITIVE Sensitive     IMIPENEM <=0.25 SENSITIVE Sensitive     NITROFURANTOIN <=16 SENSITIVE Sensitive     TRIMETH/SULFA <=20 SENSITIVE Sensitive     AMPICILLIN/SULBACTAM >=32 RESISTANT Resistant     PIP/TAZO <=4 SENSITIVE Sensitive     Extended ESBL NEGATIVE Sensitive     * 50,000 COLONIES/mL ESCHERICHIA COLI  Gram stain     Status: None   Collection Time: 02/02/18  1:00 PM  Result Value Ref Range Status   Specimen Description FLUID  Final   Special Requests   Final    NONE Performed at [facility], [place] [address] [place], [place] [address]    Gram Stain   Final    CYTOSPIN SMEAR WBC PRESENT, PREDOMINANTLY PMN NO ORGANISMS SEEN    Report Status 02/02/2018 FINAL  Final  Culture, body fluid-bottle     Status: None (Preliminary result)   Collection Time: 02/02/18  1:00 PM  Result Value Ref Range Status   Specimen Description FLUID  Final   Special Requests   Final    NONE Performed at [facility], [place] [address] [place], [place] [address]    Culture NO GROWTH 4 DAYS  Final   Report Status PENDING  Incomplete    Radiology Studies: No results found. Scheduled Meds: .  amLODipine  10 mg Oral Daily  . feeding supplement (ENSURE ENLIVE)  237 mL Oral BID BM  . lidocaine  1 patch Transdermal Q24H  . mometasone-formoterol  2 puff Inhalation BID  . nebivolol  20 mg Oral Daily  . rivaroxaban  20 mg Oral Q supper  . traMADol  50 mg Oral BID   Continuous Infusions:   LOS: 5 days   Kerney Elbe, DO [facility] PAGER is on [place]  If 7PM-7AM, please contact night-coverage [email or web address] Password [email or web address] 02/06/2018, 1:30 PM

## 2018-02-06 NOTE — Progress Notes (Signed)
CSW called Abigail Butts at [facility] and confirmed that they are able to take the patient and have a private room available once ready for DC.   CSW called [facility] to switch the facility from [facility] to [facility]. They are still awaiting insurance authorization. Will keep the CSW updated.   CSW will continue to follow.   Domenic Schwab, MSW, [place]

## 2018-02-06 NOTE — Progress Notes (Signed)
[place] for xarelto Indication: atrial fibrillation  Allergies  Allergen Reactions  . Methocarbamol Hives  . Vicodin [Hydrocodone-Acetaminophen] Other (See Comments)    Interacts with bp medication and drops blood pressure  . Aspirin Nausea And Vomiting  . Butazolidin [Phenylbutazone] Other (See Comments)    Unknown reaction  . Celebrex [Celecoxib] Nausea And Vomiting  . Codeine Nausea And Vomiting  . Doripenem Rash  . Aspirin Buf(Alhyd-Mghyd-Cacar) Nausea And Vomiting  . Mucinex [Guaifenesin Er] Itching  . Temazepam Other (See Comments)    Unknown reaction   Patient Measurements: Height: 5\' 6"  (167.6 cm) Weight: 250 lb (113.4 kg) IBW/kg (Calculated) : 59.3  Vital Signs: Temp: 98.4 F (36.9 C) (01/04 0906) Temp Source: Oral (01/04 0906) BP: 143/52 (01/04 0906) Pulse Rate: 76 (01/04 0906)  Labs: Recent Labs    02/03/18 1515  02/04/18 0117 02/04/18 1032 02/05/18 0751 02/06/18 0641 02/06/18 0839  HGB  --    < > 10.1*  --  10.2* 10.3*  --   HCT  --   --  31.2*  --  32.5* 32.8*  --   PLT  --   --  386  --  412* 417*  --   HEPARINUNFRC 0.10*  --  0.19* 0.61  --   --   --   CREATININE  --   --  1.24*  --  0.88  --  0.94   < > = values in this interval not displayed.    Estimated Creatinine Clearance: 58.9 mL/min (by C-G formula based on SCr of 0.94 mg/dL).  Assessment: 85 yo F presents with incidental finding of hepatic mass. On Xarelto PTA for Afib. Patient was previously on heparin gtt, but since liver biopsy has been rescheduled for next week as an outpatient, pharmacy has been consulted to resume Xarelto.    Patient's SCr down to 0.94 today, with CrCl ~59. Xarelto dose has been appropriately adjusted to 20 mg PO qPM with supper.   Goal of Therapy:  Therapeutic anticoagulation    Plan:  Continue Xarelto 20 mg PO qPM with supper Pharmacy will sign off for now, but will continue to follow peripherally for dose changes as  needed.  Thank you for allowing pharmacy to be a part of this patient's care.  Jackson Latino, PharmD PGY1 Pharmacy Resident Phone [phone] 02/06/2018     10:25 AM

## 2018-02-06 NOTE — Progress Notes (Signed)
Patient ID: Jordan Byrd, female   DOB: 1933-11-09, 85 y.o.   MRN: [id number]   Pt must flush drain with 5- 10 cc sterile saline daily at home Including SNF   She will hear from IR scheduler for Bile duct biopsy to be performed in IR later this week She must HOLD Xarelto 2 days prior to Bx date.  Call [phone] if any questions

## 2018-02-07 ENCOUNTER — Other Ambulatory Visit (HOSPITAL_COMMUNITY): Payer: PPO

## 2018-02-07 ENCOUNTER — Inpatient Hospital Stay (HOSPITAL_COMMUNITY): Payer: PPO

## 2018-02-07 DIAGNOSIS — R112 Nausea with vomiting, unspecified: Secondary | ICD-10-CM

## 2018-02-07 DIAGNOSIS — R0600 Dyspnea, unspecified: Secondary | ICD-10-CM

## 2018-02-07 LAB — COMPREHENSIVE METABOLIC PANEL
ALT: 175 U/L — ABNORMAL HIGH (ref 0–44)
AST: 135 U/L — ABNORMAL HIGH (ref 15–41)
Albumin: 2.1 g/dL — ABNORMAL LOW (ref 3.5–5.0)
Alkaline Phosphatase: 498 U/L — ABNORMAL HIGH (ref 38–126)
Anion gap: 9 (ref 5–15)
BUN: 22 mg/dL (ref 8–23)
CO2: 27 mmol/L (ref 22–32)
Calcium: 8.8 mg/dL — ABNORMAL LOW (ref 8.9–10.3)
Chloride: 100 mmol/L (ref 98–111)
Creatinine, Ser: 0.9 mg/dL (ref 0.44–1.00)
GFR calc Af Amer: >60 mL/min (ref >60)
GFR calc non Af Amer: 60 mL/min — ABNORMAL LOW (ref >60)
Glucose, Bld: 92 mg/dL (ref 70–99)
POTASSIUM: 4.7 mmol/L (ref 3.5–5.1)
Sodium: 136 mmol/L (ref 135–145)
Total Bilirubin: 3.8 mg/dL — ABNORMAL HIGH (ref 0.3–1.2)
Total Protein: 6 g/dL — ABNORMAL LOW (ref 6.5–8.1)

## 2018-02-07 LAB — CBC WITH DIFFERENTIAL/PLATELET
Abs Immature Granulocytes: 0.27 10*3/uL — ABNORMAL HIGH (ref 0.00–0.07)
Basophils Absolute: 0.1 10*3/uL (ref 0.0–0.1)
Basophils Relative: 1 %
Eosinophils Absolute: 0.8 10*3/uL — ABNORMAL HIGH (ref 0.0–0.5)
Eosinophils Relative: 8 %
HCT: 33.6 % — ABNORMAL LOW (ref 36.0–46.0)
Hemoglobin: 10.4 g/dL — ABNORMAL LOW (ref 12.0–15.0)
Immature Granulocytes: 2 %
LYMPHS PCT: 11 %
Lymphs Abs: 1.2 10*3/uL (ref 0.7–4.0)
MCH: 28.7 pg (ref 26.0–34.0)
MCHC: 31 g/dL (ref 30.0–36.0)
MCV: 92.8 fL (ref 80.0–100.0)
Monocytes Absolute: 0.9 10*3/uL (ref 0.1–1.0)
Monocytes Relative: 8 %
Neutro Abs: 7.8 10*3/uL — ABNORMAL HIGH (ref 1.7–7.7)
Neutrophils Relative %: 70 %
Platelets: 471 10*3/uL — ABNORMAL HIGH (ref 150–400)
RBC: 3.62 MIL/uL — ABNORMAL LOW (ref 3.87–5.11)
RDW: 19.4 % — ABNORMAL HIGH (ref 11.5–15.5)
WBC: 11.1 10*3/uL — ABNORMAL HIGH (ref 4.0–10.5)
nRBC: 0.2 % (ref 0.0–0.2)

## 2018-02-07 LAB — URINE CULTURE: Culture: NO GROWTH

## 2018-02-07 LAB — CULTURE, BODY FLUID W GRAM STAIN -BOTTLE: Culture: NO GROWTH

## 2018-02-07 LAB — MAGNESIUM: Magnesium: 2 mg/dL (ref 1.7–2.4)

## 2018-02-07 LAB — PHOSPHORUS: Phosphorus: 4.3 mg/dL (ref 2.5–4.6)

## 2018-02-07 MED ORDER — MORPHINE SULFATE (PF) 2 MG/ML IV SOLN: 2.0000 mg | INTRAVENOUS | Status: DC | PRN

## 2018-02-07 MED ORDER — IOPAMIDOL (ISOVUE-370) INJECTION 76%
100.0000 mL | Freq: Once | INTRAVENOUS | Status: AC | PRN
  Administered 2018-02-07: 100 mL via INTRAVENOUS

## 2018-02-07 MED ORDER — HEPARIN (PORCINE) 25000 UT/250ML-% IV SOLN
1200.0000 [IU]/h | INTRAVENOUS | Status: AC
  Administered 2018-02-07: 1200 [IU]/h via INTRAVENOUS
  Filled 2018-02-07: qty 250

## 2018-02-07 MED ORDER — IOPAMIDOL (ISOVUE-370) INJECTION 76%
INTRAVENOUS | Status: AC
  Filled 2018-02-07: qty 100

## 2018-02-07 MED ORDER — HALOPERIDOL LACTATE 5 MG/ML IJ SOLN: 2.0000 mg | Freq: Four times a day (QID) | INTRAMUSCULAR | Status: DC | PRN

## 2018-02-07 MED ORDER — FUROSEMIDE 10 MG/ML IJ SOLN
40.0000 mg | Freq: Once | INTRAMUSCULAR | Status: AC
  Administered 2018-02-07: 40 mg via INTRAVENOUS
  Filled 2018-02-07: qty 4

## 2018-02-07 MED ORDER — IPRATROPIUM-ALBUTEROL 0.5-2.5 (3) MG/3ML IN SOLN
3.0000 mL | Freq: Four times a day (QID) | RESPIRATORY_TRACT | Status: DC
  Administered 2018-02-08: 3 mL via RESPIRATORY_TRACT
  Filled 2018-02-07: qty 3

## 2018-02-07 MED ORDER — METHYLPREDNISOLONE SODIUM SUCC 125 MG IJ SOLR
60.0000 mg | Freq: Two times a day (BID) | INTRAMUSCULAR | Status: DC
  Administered 2018-02-07 – 2018-02-08 (×3): 60 mg via INTRAVENOUS
  Filled 2018-02-07 (×3): qty 2

## 2018-02-07 NOTE — Progress Notes (Signed)
CSW received a phone call from [facility]. They are denying patient at this time because they feel that she is to weak and not well enough to transfer.   [facility] recommended a Peer to Peer review. [facility] medical director is Diannia Ruder, he can be reached at [phone]. The authorization number is [id number] and will have to have patient's name.   CSW will consult with the MD and relay the message.   CSW will continue to follow.   Domenic Schwab, MSW, [place]

## 2018-02-07 NOTE — Progress Notes (Signed)
PROGRESS NOTE    Jordan Byrd  QPY:[id number] DOB: 09/21/1933 DOA: 02/01/2018 PCP: Levin Erp, MD   Brief Narrative:  HPI per Dr. Karmen Bongo on 02/01/18 Jordan Byrd is a 85 y.o. female with medical history significant of HTN; CHF; remote breast cancer; and afib on Xarelto presenting with a fall.  She came in for a fall in the bathroom at 0400.  She fell because she doesn't get around very well.  She has been losing her balance a lot since Thanksgiving and her knee/ankle have been giving out.  Not light-headed or dizzy.  She just loses her balance and falls.  She has not been feeling well since before Thanksgiving.  She is nauseated, difficulty eating, can't do anything, "I mean I'm just sick all over."  She has not been vomiting.  She eats a few bites and feels nauseated and can't continue to eat.  She has been having GERD at night.  They are concerned about weight loss, but she denies weight loss.  No night sweats.  No LAD.  No fevers.  No pain other than her "stomach, it rolls" when she is sick.  Grandson's wife noticed she is yellow but she has not noticed any color change.  Her urine has been "brown, almost" and so they sent a specimen to the office and they ordered lab work for her, not done yet.  ED Course:  Came in for a fall.  Frequent falls, no dizziness or other symptoms prior.  Finger dislocation, relocated and splint in place.  Found to be quite jaundiced.  Bili 13.6.  Has not had imaging to determine etiology for jaundice.  **Percutaneous placement of internal/external biliary drain on right lobe done and they were unable to get good access from the left hepatic ducts. Was placed on empiric heparin drip given unclear plan for biopsy but now that I spoke with Dr. Chilton Si. [name], patient is going to have Brush Bx done as an outpatient next week. PT evaluated and recommending SNF. Will have outpatient Oncology Evaluation once biopsy is done and path is confirmed.   She started  having severe abdominal pain this morning and started wheezing.  Obtain a CT of the abdomen and pelvis as well as a CT of the chest as patient's LFTs and T bili started to go back up again.  Per IR she will need to be off Xarelto for 2 days for biopsy.  Assessment & Plan:   Principal Problem:   Obstructive jaundice Active Problems:   Hypertension   CKD (chronic kidney disease) stage 3, GFR 30-59 ml/min (HCC)   Chronic diastolic heart failure (HCC)   Atrial fibrillation (HCC)   Falls frequently  Obstructive Jaundice abnormal LFTs, alk phos, as well as bilirubin -Patient presenting with recent frequent falls -Was noted to have marked jaundice on exam -Subsequently noted to have elevated LFTs with bili 13.6 and now was 16.5 with Direct Bilirubin 11.9 and Indirect 4.6 -Was IMPROVING and T Bili 3.2, LFTs showed AST 92 and ALT of 113, and Alk Phos was 552 yesterday but now Worsening Slightly as AST is 135, ALT is 175, T bili is 3.8, and alk phos is 498 -Since it was not clear where the patient would need to be admitted [facility]) or even if she needed to be admitted, CT ordered for further evaluation -CT Abdomen shows a baseball-sized mass in the left lobe of the liver, highly suspicious for cholangiocarcinoma; will repeat CT of the abdomen and  pelvis with contrast today given her abdominal pain as well as worsening liver function tests and worsening T bili -GI has been consulted, Dr. Therisa Doyne to see and is IR evaluation for liver biopsy and possible stenting of the intrahepatic bile duct causing obstructive jaundice.  ERCP is not indicated as the common bile duct was of normal caliber -GI sent CEA/CA-19-9/AFP levels -AFP was 6.5, CA-19-9 was 7571, and CA 125 was 67.6 and CEA was 4.0 -IR consult has been requested and she went under.  Percutaneous placement of an internal/external biliary drain on the right lobe and unable to get good access from the left hepatic ducts.  He did not undergo a brush  biopsy or liver biopsy as my discussion with Dr. Maryclare Bean today and he recommends this to be done about a week after she is discharged from [facility] as he stated that the risk of septicemia for breast biopsy with recent drain placement is high but fluid was sent for cytology and culture per IR NOTE and Cytology showed No Malignant Cells -Per my discussion with Dr. [name] the [place] Needs to be flushed daily with 5-10 cc of Fluid Daily -Now on Soft Diet  -Held Xarelto have empirically started patient on a heparin drip will change back to Xarelto now that the patient is not having a biopsy done until next week as an outpatient -Pain control with morphine and tramadol if needed; nausea control with Zofran -Continue monitor laboratory data and repeat CMP in the a.m. -Continue to Follow LFTs they are trending back up -PT OT recommending skilled nursing facility so I reached out to social worker for SNF placement; Will need Re-evaluation per my discussion with CSW   Falls -Suspect that falls are related to underlying malignancy and inability of patient to take PO -Have advance diet to full liquid diet -Will follow -She may benefit from PT evaluation once her trajectory is clarified; she will obtain a breast biopsy in outpatient setting and PT OT has been consulted and recommending skilled nursing facility but Insurance has asked for updated PT/OT notes so will re-consult PT/OT now  -She lives alone with her grandson next door and she is agreeable to short-term skilled nursing facility placement for rehabilitation  Afib on Xarelto -Rate controlled with Nebivolol 20 mg p.o. daily -Held Xarelto currently given ? Biopsy but per my discussion with IR Dr. Toni Amend Hoss/Dr. [name] she will not have this done currently but will have it done about a week (Midweek); S/p Drain Placement on the Right  -Heparin gtt now stopped but to restart if patient is still here not discharged to skilled nursing facility -Dr.  [name] states the IR team will figure out when to stop the Xarelto and inform the patient but her Xarelto must be held 2 days prior to Bx Date  Chronic Diastolic CHF -Grade 2 Diastolic Function on 7902 echo; Will Repeat ECHO this visit  -Appears to be compensated at this time -Continue to Monitor for signs and symptoms of volume overload carefully now that IVF has been resumed 100 and mils per hour.  We have now stopped IV fluids -Patient is -3.887 L since admission weight is up 7 pounds -Continue with breathing treatments and stop IV fluids at this time -Will give 1x Dose of IV Lasix 40 mg as she was Wheezing  -Added DuoNeb  AKI on Stage 3 CKD -Patient's BUN/creatinine worsened to 33/1.41 -In the setting of Nausea and Vomiting, Losartan, and Zosyn -Will D/C Losartan given worsening  Renal Fxn -Restarted IVF with NS at 100 mL/hr because of worsening renal function but IVF have stopped and Renal Function is now trending downwards and is much improved to 22/0.90 -Avoid nephrotoxic medications if possible and continue to closely monitor -Repeat CMP in a.m.  HTN -Continue home meds with Amlodipine 10 mg po Daily, Nebivolol 20 mg po Daily -Held Losartan 100 mg po Daily given acute renal function elevation for now and resume in AM   Nausea and Vomiting, worsened  -In the setting of Abdominal Mass -C/w Ondansetron and IV Phenergan  -Symptomatic Treatment  -IVF Stopped -Check Repeat CT of the Abdomen and Pelvis  Obesity -Estimated body mass index is 40.19 kg/m as calculated from the following:   Height as of this encounter: _0  (1.676 m).   Weight as of this encounter: 112.9 kg. -Weight Loss Counseling Given   Normocytic Anemia -Patient's hemoglobin/hematocrit went from 10.7/32.5 is now 10.4/33.6 -Continue to monitor for signs and symptoms of bleeding as she is anticoagulated -Checked Anemia Panel this AM and showed iron level of 39, U IBC of 275, TIBC of 314, saturation ratios  of 12%, ferritin level of 73, folate level 26.9, vitamin B12 level of 965 -Repeat CBC in the a.m.  Asymptomatic Bacteuria with Pyurioa  -Had amber color urine, moderate leukocytes, rare bacteria, 21-50 WBCs and urine culture grew out 50,000 colony-forming units of E. coli that was resistant to ampicillin and ampicillin sulbactam -Received a dose of IV Zosyn the day she had a drain placed -Patient not very symptomatic -Still have a leukocytosis of 12.5 but is afebrile and WBC could be from her pain in her back from fall versus suspected cholangiocarcinoma -Repeat U/A and Urine Cx but Do not feel strongly in treating patient  -Patient has no symptoms of burning, discomfort, dysuria, or bladder pain or flank pain; She is Alert and Oriented x3 -Continue monitor and if necessary we will continue urinary tract infection coverage with antibiotics but for now we will hold off as patient is not having symptoms  Dyspnea and Wheezing in a Hx of Asthma and CHF -Worsened since yesterday patient is hyperventilating and stating that it hurts to breathe -Placed on DuoNeb breathing treatments scheduled every 6 hours -Give one-time dose of IV Lasix 40 mg -Add flutter valve and incentive spirometer -We will give IV Solu-Medrol 60 mg 12 hours -Checking CTA of the chest but will initially get a chest x-ray first  DVT prophylaxis: Anticoagulated with Xarelto Code Status: DO NOT RESUSCITATE Family Communication: Discussed with Family at bedside  Disposition Plan: Discharge to SNF and have outpatient brush biopsy done by IR as well as drain evaluation 4 weeks when medically stable or if unable to be discharged before Brush Bx remain here and place on Heparin gtt  Consultants:   Gastroenterology  Interventional Radiology    Procedures:  PTC and placement of internal/external biliary drain  Findings: Severe intrahepatic biliary dilatation.  Central biliary obstruction.  10 Fr internal / external drain  placed from right lobe.  Unable to get good access from left hepatic ducts.     Antimicrobials:  Anti-infectives (From admission, onward)   Start     Dose/Rate Route Frequency Ordered Stop   02/02/18 0900  piperacillin-tazobactam (ZOSYN) IVPB 3.375 g  Status:  Discontinued     3.375 g 100 mL/hr over 30 Minutes Intravenous  Once 02/02/18 0838 02/05/18 1327   02/02/18 0839  piperacillin-tazobactam (ZOSYN) IVPB     over 30 Minutes  Continuous  PRN 02/02/18 0839 02/02/18 0839     Subjective: Seen and examined at bedside this AM and she was complaining of significant abdominal pain as well as dyspnea.  She was hyperventilating and states that she could not catch her breath and states that it hurt to breathe and states that her abdominal pain was making it worse.  She is very nauseous and retching.  States that her abdominal pain significantly worsened and states that her drain is putting out as much but was flushed this morning.  Objective: Vitals:   02/07/18 0120 02/07/18 0356 02/07/18 0804 02/07/18 0952  BP:  (!) 160/56  129/64  Pulse:  70  75  Resp:  18  20  Temp:  98.7 F (37.1 C)  99 F (37.2 C)  TempSrc:  Oral  Oral  SpO2:  90% 92% 93%  Weight: 112.9 kg     Height:        Intake/Output Summary (Last 24 hours) at 02/07/2018 1133 Last data filed at 02/07/2018 0951 Gross per 24 hour  Intake 660 ml  Output 1630 ml  Net -970 ml   Filed Weights   02/04/18 0504 02/06/18 0120 02/07/18 0120  Weight: 113.4 kg 113.4 kg 112.9 kg   Examination: Physical Exam:  Constitutional: Well-nourished, well-developed obese Caucasian female who is in some moderate distress appears very anxious and uncomfortable and is actively nauseous and vomiting and complaining of abdominal pain and dyspnea Eyes: Conjunctive are normal.  Sclera anicteric ENMT: External ears nose appear normal.  Grossly normal hearing Neck: Appears supple no JVD Respiratory: Diminished significantly to auscultation bilaterally  with appreciable wheezing and coarse breath sounds.  She has slightly labored breathing is hyperventilating and tachypneic. Cardiovascular: Tachycardic rate but regular rhythm no appreciable murmurs, rubs, gallops. Abdomen: Soft, tender to palpate today.  Distended secondary body habitus.  Bowel sounds present all 4 quadrants and has a biliary drain with some bilious output and it is darker in color GU: Deferred Musculoskeletal: No contractures or cyanosis.  Wearing a splint on her right index finger Skin: Jaundice is still there slightly.  No appreciable rashes or lesions limited skin evaluation Neurologic: Cranial nerves II through XII grossly intact with no appreciable focal deficits Psychiatric: Extremely anxious.  Patient is awake and alert and oriented x3  Data Reviewed: I have personally reviewed following labs and imaging studies  CBC: Recent Labs  Lab 02/01/18 0615  02/03/18 0512 02/04/18 0117 02/05/18 0751 02/06/18 0641 02/07/18 0359  WBC 10.8*   < > 12.1* 11.8* 11.4* 12.5* 11.1*  NEUTROABS 9.3*  --  9.8* 8.8* 8.1*  --  7.8*  HGB 11.4*   < > 9.8* 10.1* 10.2* 10.3* 10.4*  HCT 33.6*   < > 31.5* 31.2* 32.5* 32.8* 33.6*  MCV 85.5   < > 88.2 89.4 91.5 91.4 92.8  PLT 288   < > 339 386 412* 417* 471*   < > = values in this interval not displayed.   Basic Metabolic Panel: Recent Labs  Lab 02/03/18 0512 02/04/18 0117 02/05/18 0751 02/06/18 0839 02/07/18 0359  NA 135 137 137 136 136  K 4.1 4.5 4.6 4.4 4.7  CL 102 104 106 101 100  CO2 _0 GLUCOSE 138* 126* 107* 142* 92  BUN 33* 30* _1 CREATININE 1.41* 1.24* 0.88 0.94 0.90  CALCIUM 8.4* 8.2* 8.7* 8.8* 8.8*  MG 1.9 2.0 2.0 1.8 2.0  PHOS 5.8* 4.6 3.9 4.3 4.3   GFR:  Estimated Creatinine Clearance: 61.4 mL/min (by C-G formula based on SCr of 0.9 mg/dL). Liver Function Tests: Recent Labs  Lab 02/03/18 0512 02/04/18 0117 02/05/18 0751 02/06/18 0839 02/07/18 0359  AST 109* 58* 52* 92* 135*  ALT 194*  149* 110* 133* 175*  ALKPHOS 895* 746* 641* 552* 498*  BILITOT 6.5* 4.5* 4.1* 3.2* 3.8*  PROT 5.5* 5.6* 6.0* 6.3* 6.0*  ALBUMIN 2.2* 2.1* 2.1* 2.1* 2.1*   Recent Labs  Lab 02/01/18 0712  LIPASE 19   No results for input(s): AMMONIA in the last 168 hours. Coagulation Profile: Recent Labs  Lab 02/01/18 1712  INR 1.16   Cardiac Enzymes: No results for input(s): CKTOTAL, CKMB, CKMBINDEX, TROPONINI in the last 168 hours. BNP (last 3 results) No results for input(s): PROBNP in the last 8760 hours. HbA1C: No results for input(s): HGBA1C in the last 72 hours. CBG: No results for input(s): GLUCAP in the last 168 hours. Lipid Profile: No results for input(s): CHOL, HDL, LDLCALC, TRIG, CHOLHDL, LDLDIRECT in the last 72 hours. Thyroid Function Tests: No results for input(s): TSH, T4TOTAL, FREET4, T3FREE, THYROIDAB in the last 72 hours. Anemia Panel: Recent Labs    02/05/18 0751  VITAMINB12 965*  FOLATE 26.9  FERRITIN 73  TIBC 314  IRON 39  RETICCTPCT 2.6   Sepsis Labs: No results for input(s): PROCALCITON, LATICACIDVEN in the last 168 hours.  Recent Results (from the past 240 hour(s))  Urine culture     Status: Abnormal   Collection Time: 02/01/18  9:46 AM  Result Value Ref Range Status   Specimen Description URINE, RANDOM  Final   Special Requests   Final    NONE Performed at [facility], [address], [place], [place] [address]    Culture 50,000 COLONIES/mL ESCHERICHIA COLI (A)  Final   Report Status 02/03/2018 FINAL  Final   Organism ID, Bacteria ESCHERICHIA COLI (A)  Final      Susceptibility   Escherichia coli - MIC*    AMPICILLIN >=32 RESISTANT Resistant     CEFAZOLIN <=4 SENSITIVE Sensitive     CEFTRIAXONE <=1 SENSITIVE Sensitive     CIPROFLOXACIN <=0.25 SENSITIVE Sensitive     GENTAMICIN <=1 SENSITIVE Sensitive     IMIPENEM <=0.25 SENSITIVE Sensitive     NITROFURANTOIN <=16 SENSITIVE Sensitive     TRIMETH/SULFA <=20 SENSITIVE Sensitive      AMPICILLIN/SULBACTAM >=32 RESISTANT Resistant     PIP/TAZO <=4 SENSITIVE Sensitive     Extended ESBL NEGATIVE Sensitive     * 50,000 COLONIES/mL ESCHERICHIA COLI  Gram stain     Status: None   Collection Time: 02/02/18  1:00 PM  Result Value Ref Range Status   Specimen Description FLUID  Final   Special Requests   Final    NONE Performed at [facility], [place] [address], [place], [place] [address]    Gram Stain   Final    CYTOSPIN SMEAR WBC PRESENT, PREDOMINANTLY PMN NO ORGANISMS SEEN    Report Status 02/02/2018 FINAL  Final  Culture, body fluid-bottle     Status: None   Collection Time: 02/02/18  1:00 PM  Result Value Ref Range Status   Specimen Description FLUID  Final   Special Requests   Final    NONE Performed at [facility], [place] [address] [place], [place] [address]    Culture NO GROWTH 5 DAYS  Final   Report Status 02/07/2018 FINAL  Final    Radiology Studies: No  results found. Scheduled Meds: . amLODipine  10 mg Oral Daily  . feeding supplement (ENSURE ENLIVE)  237 mL Oral BID BM  . furosemide  40 mg Intravenous Once  . ipratropium-albuterol  3 mL Nebulization Q6H  . methylPREDNISolone (SOLU-MEDROL) injection  60 mg Intravenous Q12H  . mometasone-formoterol  2 puff Inhalation BID  . nebivolol  20 mg Oral Daily  . rivaroxaban  20 mg Oral Q supper  . traMADol  50 mg Oral BID   Continuous Infusions:   LOS: 6 days   Kerney Elbe, DO [facility] PAGER is on AMION  If 7PM-7AM, please contact night-coverage [email or web address] Password [facility] 02/07/2018, 11:33 AM

## 2018-02-07 NOTE — Progress Notes (Signed)
Physical Therapy Treatment Patient Details Name: Jordan Byrd MRN: [id number] DOB: Oct 04, 1933 Today's Date: 02/07/2018    History of Present Illness 85 y.o. female with medical history significant of HTN; CHF; remote breast cancer; and afib on Xarelto presenting with a fall. Dx of jaundice, new liver mass noted on imaging, s/p percutaneous biliary drain 02/02/18.     PT Comments    Pt limited today in mobility by increased back and abdominal pain. Pt only willing to participate in rolling to adjust pads underneath her to decrease back pain. D/c plans remain appropriate as once pain is controlled pt will be able to provide assist with mobility. PT will continue to follow acutely.     Follow Up Recommendations  SNF;Supervision/Assistance - 24 hour;Supervision for mobility/OOB     Equipment Recommendations  None recommended by PT       Precautions / Restrictions Precautions Precautions: Fall Precaution Comments: 8 falls since November Restrictions Weight Bearing Restrictions: No    Mobility  Bed Mobility Overal bed mobility: Needs Assistance Bed Mobility: Rolling Rolling: Total assist         General bed mobility comments: total A for rolling to reposition in bed, refused further movement due to back pain         Balance Overall balance assessment: Needs assistance                                          Cognition Arousal/Alertness: Awake/alert Behavior During Therapy: WFL for tasks assessed/performed Overall Cognitive Status: Within Functional Limits for tasks assessed                                        Exercises General Exercises - Lower Extremity Ankle Circles/Pumps: AROM;Both;10 reps;Supine    General Comments General comments (skin integrity, edema, etc.): Family present during session requesting pt repositioning due to increased pain      Pertinent Vitals/Pain Pain Assessment: 0-10 Pain Score: 8  Pain Location:  abdomen and back with bed mobility Pain Descriptors / Indicators: Sore           PT Goals (current goals can now be found in the care plan section) Acute Rehab PT Goals Patient Stated Goal: return to prior functional level PT Goal Formulation: With patient Time For Goal Achievement: 02/18/18 Potential to Achieve Goals: Fair Progress towards PT goals: Not progressing toward goals - comment(pt limited in mobility by increased abdominal and back pain)    Frequency    Min 2X/week      PT Plan Current plan remains appropriate       AM-PAC PT "6 Clicks" Mobility   Outcome Measure  Help needed turning from your back to your side while in a flat bed without using bedrails?: Total Help needed moving from lying on your back to sitting on the side of a flat bed without using bedrails?: Total Help needed moving to and from a bed to a chair (including a wheelchair)?: Total Help needed standing up from a chair using your arms (e.g., wheelchair or bedside chair)?: Total Help needed to walk in hospital room?: Total Help needed climbing 3-5 steps with a railing? : Total 6 Click Score: 6    End of Session   Activity Tolerance: Patient limited by pain Patient left: in bed;with bed  alarm set;with call bell/phone within reach Nurse Communication: Mobility status;Need for lift equipment PT Visit Diagnosis: Muscle weakness (generalized) (M62.81);Repeated falls (R29.6);Adult, failure to thrive (R62.7);History of falling (Z91.81);Other abnormalities of gait and mobility (R26.89)     Time: 2589-4834 PT Time Calculation (min) (ACUTE ONLY): 11 min  Charges:  $Therapeutic Activity: 8-22 mins                     Corky Blumstein B. Migdalia Dk PT, DPT Acute Rehabilitation Services Pager [phone] Office [phone]    [place] 02/07/2018, 3:43 PM

## 2018-02-07 NOTE — Progress Notes (Signed)
RN called for pt agitated and new onset confusion. Dr. Alfredia Ferguson paged prior to arrival, new orders for ABG. 2 RT attempt ABG draw, unsuccessful. MD made aware, new order for 2mg  Haldol IVP given

## 2018-02-07 NOTE — Progress Notes (Signed)
Referring Physician(s): Dr Claybon Jabs  Supervising Physician: Aletta Edouard  Patient Status:  [facility] - In-pt  Chief Complaint:  Int/Ext Biliary drain placed 12/31 Liver mass   Subjective:  OP great- bile appearing Flushes easily Plan for bile duct bx later this week Can be as OP Will need to be off Xarelto 2 days for bx-- must flush drain daily 5-10 cc sterile saline IR scheduler will schedule bx and let pt know-- if IP--we will put on schedule asap  Allergies: Methocarbamol; Vicodin [hydrocodone-acetaminophen]; Aspirin; Butazolidin [phenylbutazone]; Celebrex [celecoxib]; Codeine; Doripenem; Aspirin buf(alhyd-mghyd-cacar); Mucinex [guaifenesin er]; and Temazepam  Medications: Prior to Admission medications   Medication Sig Start Date End Date Taking? Authorizing Provider  acetaminophen (TYLENOL) 500 MG tablet Take 1,000 mg by mouth 2 (two) times daily.   Yes [provider]  albuterol (PROVENTIL HFA;VENTOLIN HFA) 108 (90 BASE) MCG/ACT inhaler Inhale 1 puff into the lungs every 6 (six) hours as needed for wheezing. Reported on 04/10/2015   Yes [provider]  amLODipine (NORVASC) 10 MG tablet Take 10 mg by mouth daily.   Yes [provider]  budesonide-formoterol (SYMBICORT) 160-4.5 MCG/ACT inhaler Inhale 2 puffs into the lungs See admin instructions. Inhale 1 puff every morning, may inhale a 2nd puff in the evening if needed for wheezing   Yes [provider]  losartan (COZAAR) 100 MG tablet Take 100 mg by mouth daily.   Yes [provider]  Multiple Vitamin (MULTIVITAMIN WITH MINERALS) TABS tablet Take 1 tablet by mouth daily.   Yes [provider]  Multiple Vitamins-Minerals (ICAPS) CAPS Take 1 capsule by mouth daily.   Yes [provider]  Nebivolol HCl (BYSTOLIC) 20 MG TABS Take 1 tablet (20 mg total) by mouth daily. 10/24/15  Yes Allred, Jeneen Rinks, MD  nitroGLYCERIN (NITROSTAT) 0.4 MG SL tablet Place 0.4 mg under the  tongue every 5 (five) minutes as needed for chest pain.   Yes [provider]  omeprazole (PRILOSEC) 20 MG capsule Take 20 mg by mouth daily.   Yes [provider]  potassium chloride (K-DUR) 10 MEQ tablet Take 10 mEq by mouth daily.  04/10/15  Yes [provider]  rivaroxaban (XARELTO) 20 MG TABS tablet Take 1 tablet (20 mg total) by mouth daily with supper. Patient taking differently: Take 20 mg by mouth daily with breakfast.  10/24/15  Yes Allred, Jeneen Rinks, MD  traMADol (ULTRAM) 50 MG tablet Take 50 mg by mouth 2 (two) times daily.   Yes [provider]     Vital Signs: BP (!) 160/56 (BP Location: Right Arm)   Pulse 70   Temp 98.7 F (37.1 C) (Oral)   Resp 18   Ht 5\' 6"  (1.676 m)   Wt 249 lb (112.9 kg)   SpO2 92%   BMI 40.19 kg/m   Physical Exam Vitals signs reviewed.  Abdominal:     General: Bowel sounds are normal.     Palpations: Abdomen is soft.     Tenderness: There is no abdominal tenderness.  Musculoskeletal: Normal range of motion.  Skin:    General: Skin is warm and dry.     Comments: Site of drain is clean and dry NT Flushes easily OP bile Cx NGTD    Neurological:     General: No focal deficit present.     Mental Status: She is alert and oriented to person, place, and time.  Psychiatric:        Mood and Affect: Mood normal.  Behavior: Behavior normal.     Imaging: No results found.  Labs:  CBC: Recent Labs    02/04/18 0117 02/05/18 0751 02/06/18 0641 02/07/18 0359  WBC 11.8* 11.4* 12.5* 11.1*  HGB 10.1* 10.2* 10.3* 10.4*  HCT 31.2* 32.5* 32.8* 33.6*  PLT 386 412* 417* 471*    COAGS: Recent Labs    02/01/18 1712 02/03/18 0512  INR 1.16  --   APTT  --  60*    BMP: Recent Labs    02/04/18 0117 02/05/18 0751 02/06/18 0839 02/07/18 0359  NA 137 137 136 136  K 4.5 4.6 4.4 4.7  CL 104 106 101 100  CO2 26 23 25 27   GLUCOSE 126* 107* 142* 92  BUN 30* 20 16 22   CALCIUM 8.2* 8.7* 8.8* 8.8*    CREATININE 1.24* 0.88 0.94 0.90  GFRNONAA 40* >60 56* 60*  GFRAA 47* >60 >60 >60    LIVER FUNCTION TESTS: Recent Labs    02/04/18 0117 02/05/18 0751 02/06/18 0839 02/07/18 0359  BILITOT 4.5* 4.1* 3.2* 3.8*  AST 58* 52* 92* 135*  ALT 149* 110* 133* 175*  ALKPHOS 746* 641* 552* 498*  PROT 5.6* 6.0* 6.3* 6.0*  ALBUMIN 2.1* 2.1* 2.1* 2.1*    Assessment and Plan:  Liver mass Obstructive jaundice Int/ext biliary drain placed 12/31 Will need brush bx of bile duct this week   Electronically Signed: Lavonia Drafts, PA-C 02/07/2018, 9:51 AM   I spent a total of 25 Minutes at the the patient's bedside AND on the patient's hospital floor or unit, greater than 50% of which was counseling/coordinating care for Int./Ext Bili drain

## 2018-02-07 NOTE — Progress Notes (Signed)
ANTICOAGULATION CONSULT NOTE - Initial Consult  Pharmacy Consult for Xarelto to Heparin Indication: atrial fibrillation  Allergies  Allergen Reactions  . Methocarbamol Hives  . Vicodin [Hydrocodone-Acetaminophen] Other (See Comments)    Interacts with bp medication and drops blood pressure  . Aspirin Nausea And Vomiting  . Butazolidin [Phenylbutazone] Other (See Comments)    Unknown reaction  . Celebrex [Celecoxib] Nausea And Vomiting  . Codeine Nausea And Vomiting  . Doripenem Rash  . Aspirin Buf(Alhyd-Mghyd-Cacar) Nausea And Vomiting  . Mucinex [Guaifenesin Er] Itching  . Temazepam Other (See Comments)    Unknown reaction    Patient Measurements: Height: 5\' 6"  (167.6 cm) Weight: 249 lb (112.9 kg) IBW/kg (Calculated) : 59.3 Heparin Dosing Weight: 88 kg  Vital Signs: Temp: 100.8 F (38.2 C) (01/05 1709) Temp Source: Oral (01/05 1709) BP: 140/55 (01/05 1709) Pulse Rate: 79 (01/05 1709)  Labs: Recent Labs    02/05/18 0751 02/06/18 0641 02/06/18 0839 02/07/18 0359  HGB 10.2* 10.3*  --  10.4*  HCT 32.5* 32.8*  --  33.6*  PLT 412* 417*  --  471*  CREATININE 0.88  --  0.94 0.90    Estimated Creatinine Clearance: 61.4 mL/min (by C-G formula based on SCr of 0.9 mg/dL).   Medical History: Past Medical History:  Diagnosis Date  . Anemia   . Arthritis    "all over" (02/01/2018)  . Asthma   . Atrial fibrillation ([place])    on Xarelto  . Breast cancer, left breast ([place]) 05/19/13   left breast stage IIA   . CHF (congestive heart failure) ([place])   . COPD (chronic obstructive pulmonary disease) ([place])   . GERD (gastroesophageal reflux disease)    [name] Prilosec  . Hypertension   . Macular degeneration   . Obstructive jaundice 02/01/2018  . On home oxygen therapy    "2.5L prn" (02/01/2018)  . Osteoarthritis    a. 03/28/11 - R Total Hip Arthroplasty  . Pleurisy   . Pneumonia    "2-3 times" (02/01/2018)    Assessment: 85 year old female to begin heparin while  Xarelto on hold for liver biopsy Last dose of Xarelto last PM  Goal of Therapy:  Heparin level 0.3-0.7 units/ml Monitor platelets by anticoagulation protocol: Yes  PTT = 66 to 102 seconds   Plan:  Heparin at 1200 units / hr PTT and heparin level 8 hours after heparin starts Daily labs  Thank you Anette Guarneri, PharmD [phone]  02/07/2018,5:32 PM

## 2018-02-07 NOTE — Progress Notes (Signed)
PT Cancellation Note  Patient Details Name: Jordan Byrd MRN: [id number] DOB: [birth year]   Cancelled Treatment:    Reason Eval/Treat Not Completed: (P) Medical issues which prohibited therapy;Pain limiting ability to participate RN requesting PT to follow back later due to pt with increased stomach and back pain . PT will follow back later for evaluation as able.  Azalie Harbeck B. Migdalia Dk PT, DPT Acute Rehabilitation Services Pager [phone] Office [phone]    [place] 02/07/2018, 11:53 AM

## 2018-02-08 ENCOUNTER — Inpatient Hospital Stay (HOSPITAL_COMMUNITY): Payer: PPO

## 2018-02-08 DIAGNOSIS — D72829 Elevated white blood cell count, unspecified: Secondary | ICD-10-CM

## 2018-02-08 DIAGNOSIS — I361 Nonrheumatic tricuspid (valve) insufficiency: Secondary | ICD-10-CM

## 2018-02-08 DIAGNOSIS — K5900 Constipation, unspecified: Secondary | ICD-10-CM

## 2018-02-08 LAB — CBC WITH DIFFERENTIAL/PLATELET
ABS IMMATURE GRANULOCYTES: 0 10*3/uL (ref 0.00–0.07)
Basophils Absolute: 0 10*3/uL (ref 0.0–0.1)
Basophils Relative: 0 %
Eosinophils Absolute: 0 10*3/uL (ref 0.0–0.5)
Eosinophils Relative: 0 %
HCT: 34.3 % — ABNORMAL LOW (ref 36.0–46.0)
Hemoglobin: 10.2 g/dL — ABNORMAL LOW (ref 12.0–15.0)
Lymphocytes Relative: 1 %
Lymphs Abs: 0.3 10*3/uL — ABNORMAL LOW (ref 0.7–4.0)
MCH: 28 pg (ref 26.0–34.0)
MCHC: 29.7 g/dL — ABNORMAL LOW (ref 30.0–36.0)
MCV: 94.2 fL (ref 80.0–100.0)
Monocytes Absolute: 0 10*3/uL — ABNORMAL LOW (ref 0.1–1.0)
Monocytes Relative: 0 %
NEUTROS ABS: 24.8 10*3/uL — AB (ref 1.7–7.7)
NEUTROS PCT: 99 %
Platelets: 438 10*3/uL — ABNORMAL HIGH (ref 150–400)
RBC: 3.64 MIL/uL — ABNORMAL LOW (ref 3.87–5.11)
RDW: 18.6 % — ABNORMAL HIGH (ref 11.5–15.5)
WBC: 25 10*3/uL — ABNORMAL HIGH (ref 4.0–10.5)
nRBC: 0 % (ref 0.0–0.2)
nRBC: 0 /100 WBC

## 2018-02-08 LAB — COMPREHENSIVE METABOLIC PANEL
ALT: 245 U/L — AB (ref 0–44)
AST: 145 U/L — ABNORMAL HIGH (ref 15–41)
Albumin: 2.3 g/dL — ABNORMAL LOW (ref 3.5–5.0)
Alkaline Phosphatase: 496 U/L — ABNORMAL HIGH (ref 38–126)
Anion gap: 14 (ref 5–15)
BUN: 32 mg/dL — ABNORMAL HIGH (ref 8–23)
CO2: 20 mmol/L — ABNORMAL LOW (ref 22–32)
Calcium: 8.5 mg/dL — ABNORMAL LOW (ref 8.9–10.3)
Chloride: 100 mmol/L (ref 98–111)
Creatinine, Ser: 1.23 mg/dL — ABNORMAL HIGH (ref 0.44–1.00)
GFR calc Af Amer: 47 mL/min — ABNORMAL LOW (ref >60)
GFR calc non Af Amer: 41 mL/min — ABNORMAL LOW (ref >60)
GLUCOSE: 154 mg/dL — AB (ref 70–99)
Potassium: 4.6 mmol/L (ref 3.5–5.1)
SODIUM: 134 mmol/L — AB (ref 135–145)
Total Bilirubin: 3.2 mg/dL — ABNORMAL HIGH (ref 0.3–1.2)
Total Protein: 6.4 g/dL — ABNORMAL LOW (ref 6.5–8.1)

## 2018-02-08 LAB — ECHOCARDIOGRAM COMPLETE
Height: 66 in
Weight: 3984 oz

## 2018-02-08 LAB — APTT
aPTT: 84 seconds — ABNORMAL HIGH (ref 24–36)
aPTT: 91 seconds — ABNORMAL HIGH (ref 24–36)

## 2018-02-08 LAB — MAGNESIUM: Magnesium: 2.1 mg/dL (ref 1.7–2.4)

## 2018-02-08 LAB — HEPARIN LEVEL (UNFRACTIONATED): Heparin Unfractionated: 0.85 IU/mL — ABNORMAL HIGH (ref 0.30–0.70)

## 2018-02-08 LAB — PHOSPHORUS: Phosphorus: 5.1 mg/dL — ABNORMAL HIGH (ref 2.5–4.6)

## 2018-02-08 MED ORDER — HEPARIN (PORCINE) 25000 UT/250ML-% IV SOLN
1200.0000 [IU]/h | INTRAVENOUS | Status: AC
  Administered 2018-02-08: 1200 [IU]/h via INTRAVENOUS

## 2018-02-08 MED ORDER — BISACODYL 10 MG RE SUPP
10.0000 mg | Freq: Once | RECTAL | Status: AC
  Administered 2018-02-08: 10 mg via RECTAL
  Filled 2018-02-08: qty 1

## 2018-02-08 MED ORDER — POLYETHYLENE GLYCOL 3350 17 G PO PACK
17.0000 g | PACK | Freq: Two times a day (BID) | ORAL | Status: DC
  Administered 2018-02-08 – 2018-02-11 (×4): 17 g via ORAL
  Filled 2018-02-08 (×6): qty 1

## 2018-02-08 MED ORDER — SENNOSIDES-DOCUSATE SODIUM 8.6-50 MG PO TABS
1.0000 | ORAL_TABLET | Freq: Two times a day (BID) | ORAL | Status: DC
  Administered 2018-02-08 – 2018-02-11 (×4): 1 via ORAL
  Filled 2018-02-08 (×6): qty 1

## 2018-02-08 MED ORDER — METHYLPREDNISOLONE SODIUM SUCC 125 MG IJ SOLR
60.0000 mg | Freq: Every day | INTRAMUSCULAR | Status: DC
  Filled 2018-02-08: qty 2

## 2018-02-08 NOTE — Consult Note (Signed)
   [facility] CM Inpatient Consult   02/08/2018  Jordan Byrd October 15, 1933 [id number]   Follow up:  Spoke with inpatient [facility] and Social worker.  Patient continues to progress slowly.  Will continue to follow for needs and disposition.  Natividad Brood, RN BSN [facility] Liaison  [phone] business mobile phone Toll free office [phone]

## 2018-02-08 NOTE — Progress Notes (Signed)
Referring Physician(s):  Dr. Alfredia Ferguson  Supervising Physician: Arne Cleveland  Patient Status:  [facility] - In-pt  Chief Complaint: Follow up biliary drain placed 12/31 and liver mass.  Subjective:  Patient states she feels pretty good, is hungry and tired of being in bed. She denies any complaints today, states someone talked to her earlier about a liver biopsy and "something with my drain."  Allergies: Methocarbamol; Vicodin [hydrocodone-acetaminophen]; Aspirin; Butazolidin [phenylbutazone]; Celebrex [celecoxib]; Codeine; Doripenem; Aspirin buf(alhyd-mghyd-cacar); Mucinex [guaifenesin er]; and Temazepam  Medications: Prior to Admission medications   Medication Sig Start Date End Date Taking? Authorizing Provider  acetaminophen (TYLENOL) 500 MG tablet Take 1,000 mg by mouth 2 (two) times daily.   Yes [provider]  albuterol (PROVENTIL HFA;VENTOLIN HFA) 108 (90 BASE) MCG/ACT inhaler Inhale 1 puff into the lungs every 6 (six) hours as needed for wheezing. Reported on 04/10/2015   Yes [provider]  amLODipine (NORVASC) 10 MG tablet Take 10 mg by mouth daily.   Yes [provider]  budesonide-formoterol (SYMBICORT) 160-4.5 MCG/ACT inhaler Inhale 2 puffs into the lungs See admin instructions. Inhale 1 puff every morning, may inhale a 2nd puff in the evening if needed for wheezing   Yes [provider]  losartan (COZAAR) 100 MG tablet Take 100 mg by mouth daily.   Yes [provider]  Multiple Vitamin (MULTIVITAMIN WITH MINERALS) TABS tablet Take 1 tablet by mouth daily.   Yes [provider]  Multiple Vitamins-Minerals (ICAPS) CAPS Take 1 capsule by mouth daily.   Yes [provider]  Nebivolol HCl (BYSTOLIC) 20 MG TABS Take 1 tablet (20 mg total) by mouth daily. 10/24/15  Yes Allred, Jeneen Rinks, MD  nitroGLYCERIN (NITROSTAT) 0.4 MG SL tablet Place 0.4 mg under the tongue every 5 (five) minutes as needed for chest pain.   Yes [provider]  omeprazole (PRILOSEC) 20 MG capsule Take 20 mg by mouth daily.   Yes [provider]  potassium chloride (K-DUR) 10 MEQ tablet Take 10 mEq by mouth daily.  04/10/15  Yes [provider]  rivaroxaban (XARELTO) 20 MG TABS tablet Take 1 tablet (20 mg total) by mouth daily with supper. Patient taking differently: Take 20 mg by mouth daily with breakfast.  10/24/15  Yes Allred, Jeneen Rinks, MD  traMADol (ULTRAM) 50 MG tablet Take 50 mg by mouth 2 (two) times daily.   Yes [provider]     Vital Signs: BP (!) 129/56 (BP Location: Right Arm)   Pulse 72   Temp 97.6 F (36.4 C) (Oral)   Resp 18   Ht 5\' 6"  (1.676 m)   Wt 249 lb (112.9 kg)   SpO2 96%   BMI 40.19 kg/m   Physical Exam Vitals signs and nursing note reviewed.  Constitutional:      General: She is not in acute distress. HENT:     Head: Normocephalic and atraumatic.  Cardiovascular:     Rate and Rhythm: Normal rate and regular rhythm.  Pulmonary:     Effort: Pulmonary effort is normal.     Breath sounds: Normal breath sounds.  Abdominal:     Tenderness: There is no abdominal tenderness.     Comments: RUQ drain to gravity with ~10 cc dark brown liquid OP. Insertion site is in tact -- dressing with dried yellow material present, patient reports some leakage from drain site. No active bleeding or tenderness.   Skin:    General: Skin is warm and dry.  Neurological:  Mental Status: She is alert. Mental status is at baseline.  Psychiatric:        Mood and Affect: Mood normal.        Behavior: Behavior normal.        Thought Content: Thought content normal.        Judgment: Judgment normal.     Imaging: Ct Angio Chest Pe W Or Wo Contrast  Result Date: 02/07/2018 CLINICAL DATA:  Dyspnea, wheezing, history asthma, CHF, worsened symptoms since yesterday, hyperventilation, abdominal distension; history of biliary dilatation and central biliary obstruction post percutaneous drainage EXAM: CT  ANGIOGRAPHY CHEST CT ABDOMEN AND PELVIS WITH CONTRAST TECHNIQUE: Multidetector CT imaging of the chest was performed using the standard protocol during bolus administration of intravenous contrast. Multiplanar CT image reconstructions and MIPs were obtained to evaluate the vascular anatomy. Multidetector CT imaging of the abdomen and pelvis was performed using the standard protocol during bolus administration of intravenous contrast. CONTRAST:  134mL ISOVUE-370 IOPAMIDOL (ISOVUE-370) INJECTION 76% IV. COMPARISON:  CT abdomen and pelvis 02/01/2018 FINDINGS: CTA CHEST FINDINGS Cardiovascular: Atherosclerotic calcifications of aorta, proximal great vessels and coronary arteries. Cardiac chambers appear mildly enlarged. No pericardial effusion. Pulmonary arteries adequately opacified and patent. No evidence of pulmonary embolism. Mediastinum/Nodes: Base of cervical region normal appearance. No thoracic adenopathy. Esophagus unremarkable. Lungs/Pleura: Small focus of infiltrate or atelectasis at anterior LEFT upper lobe image 51. Multiple tiny BILATERAL lung nodules, noncalcified. 7 mm LEFT lower lobe nodule image 50. Remaining lungs clear. No infiltrate, pleural effusion or pneumothorax Musculoskeletal: Osseous demineralization. Prior cervical spine fusion. No definite bone lesions. Review of the MIP images confirms the above findings. CT ABDOMEN and PELVIS FINDINGS Hepatobiliary: Percutaneous biliary drain decompression of the RIGHT biliary system since previous exam. Persistent dilatation of the intrahepatic biliary radicles of the LEFT lobe. No focal hepatic mass lesion or visualized obstructing mass at the central biliary tree. Gallbladder surgically absent. Pancreas: Normal appearance Spleen: Normal appearance Adrenals/Urinary Tract: Adrenal glands normal appearance. Tiny cyst at inferior pole RIGHT kidney. Kidneys, ureters, and bladder otherwise unremarkable. Stomach/Bowel: Appendix surgically absent by history.  Dense retained contrast in transverse colon. Stomach decompressed. Stomach and bowel loops otherwise unremarkable. Vascular/Lymphatic: Atherosclerotic calcifications of aorta and iliac arteries. Aorta normal caliber. No adenopathy. Reproductive: Uterus surgically absent with nonvisualization of ovaries Other: No free air or free fluid. No hernia or acute inflammatory process. Musculoskeletal: No acute osseous lesions. RIGHT hip prosthesis noted. Degenerative disc and facet disease changes lumbar spine. Review of the MIP images confirms the above findings. IMPRESSION: No evidence of pulmonary embolism. Multiple small BILATERAL noncalcified pulmonary nodules measuring up to 7 mm in largest diameter in LEFT lobe, nonspecific but pulmonary metastases are not excluded. Persistent intrahepatic biliary dilatation involving the LEFT lobe of the liver with decreased biliary dilatation of the RIGHT lobe biliary tree post percutaneous drainage. Scattered atherosclerotic calcifications including within coronary arteries. Electronically Signed   By: Lavonia Dana M.D.   On: 02/07/2018 17:33   Ct Abdomen Pelvis W Contrast  Result Date: 02/07/2018 CLINICAL DATA:  Dyspnea, wheezing, history asthma, CHF, worsened symptoms since yesterday, hyperventilation, abdominal distension; history of biliary dilatation and central biliary obstruction post percutaneous drainage EXAM: CT ANGIOGRAPHY CHEST CT ABDOMEN AND PELVIS WITH CONTRAST TECHNIQUE: Multidetector CT imaging of the chest was performed using the standard protocol during bolus administration of intravenous contrast. Multiplanar CT image reconstructions and MIPs were obtained to evaluate the vascular anatomy. Multidetector CT imaging of the abdomen and pelvis was performed using the standard  protocol during bolus administration of intravenous contrast. CONTRAST:  182mL ISOVUE-370 IOPAMIDOL (ISOVUE-370) INJECTION 76% IV. COMPARISON:  CT abdomen and pelvis 02/01/2018 FINDINGS: CTA  CHEST FINDINGS Cardiovascular: Atherosclerotic calcifications of aorta, proximal great vessels and coronary arteries. Cardiac chambers appear mildly enlarged. No pericardial effusion. Pulmonary arteries adequately opacified and patent. No evidence of pulmonary embolism. Mediastinum/Nodes: Base of cervical region normal appearance. No thoracic adenopathy. Esophagus unremarkable. Lungs/Pleura: Small focus of infiltrate or atelectasis at anterior LEFT upper lobe image 51. Multiple tiny BILATERAL lung nodules, noncalcified. 7 mm LEFT lower lobe nodule image 50. Remaining lungs clear. No infiltrate, pleural effusion or pneumothorax Musculoskeletal: Osseous demineralization. Prior cervical spine fusion. No definite bone lesions. Review of the MIP images confirms the above findings. CT ABDOMEN and PELVIS FINDINGS Hepatobiliary: Percutaneous biliary drain decompression of the RIGHT biliary system since previous exam. Persistent dilatation of the intrahepatic biliary radicles of the LEFT lobe. No focal hepatic mass lesion or visualized obstructing mass at the central biliary tree. Gallbladder surgically absent. Pancreas: Normal appearance Spleen: Normal appearance Adrenals/Urinary Tract: Adrenal glands normal appearance. Tiny cyst at inferior pole RIGHT kidney. Kidneys, ureters, and bladder otherwise unremarkable. Stomach/Bowel: Appendix surgically absent by history. Dense retained contrast in transverse colon. Stomach decompressed. Stomach and bowel loops otherwise unremarkable. Vascular/Lymphatic: Atherosclerotic calcifications of aorta and iliac arteries. Aorta normal caliber. No adenopathy. Reproductive: Uterus surgically absent with nonvisualization of ovaries Other: No free air or free fluid. No hernia or acute inflammatory process. Musculoskeletal: No acute osseous lesions. RIGHT hip prosthesis noted. Degenerative disc and facet disease changes lumbar spine. Review of the MIP images confirms the above findings.  IMPRESSION: No evidence of pulmonary embolism. Multiple small BILATERAL noncalcified pulmonary nodules measuring up to 7 mm in largest diameter in LEFT lobe, nonspecific but pulmonary metastases are not excluded. Persistent intrahepatic biliary dilatation involving the LEFT lobe of the liver with decreased biliary dilatation of the RIGHT lobe biliary tree post percutaneous drainage. Scattered atherosclerotic calcifications including within coronary arteries. Electronically Signed   By: Lavonia Dana M.D.   On: 02/07/2018 17:33   Dg Chest Port 1 View  Result Date: 02/08/2018 CLINICAL DATA:  85 year old female with shortness breath for the past week. Subsequent encounter. EXAM: PORTABLE CHEST 1 VIEW COMPARISON:  02/07/2018 chest CT and chest x-ray. FINDINGS: Mild cardiomegaly. Central pulmonary vascular prominence. No segmental consolidation or pneumothorax. CT detected pulmonary nodules not appreciated on present plain film exam. Calcified aorta. Calcified left carotid bifurcation. Bilateral acromioclavicular joint degenerative changes. Prior surgery lower cervical spine. IMPRESSION: 1. Mild cardiomegaly. 2. Mild central pulmonary vascular prominence. 3. Blunting left costophrenic angle similar to prior exams and may be related to prominent epicardial fat pad/subsegmental atelectasis as seen on CT. 4. CT detected pulmonary nodules not appreciated on present plain film exam. Electronically Signed   By: Genia Del M.D.   On: 02/08/2018 07:05   Dg Chest Port 1 View  Result Date: 02/07/2018 CLINICAL DATA:  Shortness of breath with altered mental status. EXAM: PORTABLE CHEST 1 VIEW COMPARISON:  02/09/2015 FINDINGS: Lungs are adequately inflated without focal airspace consolidation or effusion. Cardiomediastinal silhouette and remainder of the exam is unchanged. IMPRESSION: No active disease. Electronically Signed   By: Marin Olp M.D.   On: 02/07/2018 12:37    Labs:  CBC: Recent Labs    02/05/18 0751  02/06/18 0641 02/07/18 0359 02/08/18 0531  WBC 11.4* 12.5* 11.1* 25.0*  HGB 10.2* 10.3* 10.4* 10.2*  HCT 32.5* 32.8* 33.6* 34.3*  PLT 412* 417* 471*  438*    COAGS: Recent Labs    02/01/18 1712 02/03/18 0512 02/08/18 0531 02/08/18 1140  INR 1.16  --   --   --   APTT  --  60* 84* 91*    BMP: Recent Labs    02/05/18 0751 02/06/18 0839 02/07/18 0359 02/08/18 0531  NA 137 136 136 134*  K 4.6 4.4 4.7 4.6  CL 106 101 100 100  CO2 23 25 27  20*  GLUCOSE 107* 142* 92 154*  BUN 20 16 22  32*  CALCIUM 8.7* 8.8* 8.8* 8.5*  CREATININE 0.88 0.94 0.90 1.23*  GFRNONAA >60 56* 60* 41*  GFRAA >60 >60 >60 47*    LIVER FUNCTION TESTS: Recent Labs    02/05/18 0751 02/06/18 0839 02/07/18 0359 02/08/18 0531  BILITOT 4.1* 3.2* 3.8* 3.2*  AST 52* 92* 135* 145*  ALT 110* 133* 175* 245*  ALKPHOS 641* 552* 498* 496*  PROT 6.0* 6.3* 6.0* 6.4*  ALBUMIN 2.1* 2.1* 2.1* 2.3*    Assessment and Plan:  Patient s/p biliary drain placement 12/31 by Dr. Anselm Pancoast -- liver mass was noted on imaging prior to this procedure as well. Previously planned for brush biopsy as outpatient however patient began to experience severe abdominal pain and wheezing on 02/07/18 and was kept for further management. Additionally, drain appears to be malfunctioning currently - may have been pulled back some during care. Drain OP reported as 5 cc in last 24 hours.  Plan to bring patient to IR on 1/7 for brush biopsy as well as drain revision -- NPO after midnight, hold heparin after 0200 on 1/7, AM labs ordered.   Risks and benefits discussed with the patient including, but not limited to bleeding, infection, damage to adjacent structures or low yield requiring additional tests.  All of the patient's questions were answered, patient is agreeable to proceed.  Consent signed and in chart.  Electronically Signed: Joaquim Nam, PA-C 02/08/2018, 12:31 PM   I spent a total of 25 Minutes at the the patient's bedside  AND on the patient's hospital floor or unit, greater than 50% of which was counseling/coordinating care for biliary drain follow up/brush biopsy consult.

## 2018-02-08 NOTE — Progress Notes (Signed)
[place] for Heparin Indication: atrial fibrillation  Allergies  Allergen Reactions  . Methocarbamol Hives  . Vicodin [Hydrocodone-Acetaminophen] Other (See Comments)    Interacts with bp medication and drops blood pressure  . Aspirin Nausea And Vomiting  . Butazolidin [Phenylbutazone] Other (See Comments)    Unknown reaction  . Celebrex [Celecoxib] Nausea And Vomiting  . Codeine Nausea And Vomiting  . Doripenem Rash  . Aspirin Buf(Alhyd-Mghyd-Cacar) Nausea And Vomiting  . Mucinex [Guaifenesin Er] Itching  . Temazepam Other (See Comments)    Unknown reaction   Patient Measurements: Height: 5\' 6"  (167.6 cm) Weight: 249 lb (112.9 kg) IBW/kg (Calculated) : 59.3 Heparin Dosing Weight: 84.8 kg  Vital Signs: Temp: 97.5 F (36.4 C) (01/06 0509) Temp Source: Oral (01/06 0509) BP: 133/56 (01/06 0509) Pulse Rate: 66 (01/06 0509)  Labs: Recent Labs    02/05/18 0751 02/06/18 0641 02/06/18 0839 02/07/18 0359 02/08/18 0531  HGB 10.2* 10.3*  --  10.4* 10.2*  HCT 32.5* 32.8*  --  33.6* 34.3*  PLT 412* 417*  --  471* 438*  APTT  --   --   --   --  84*  HEPARINUNFRC  --   --   --   --  0.85*  CREATININE 0.88  --  0.94 0.90  --     Estimated Creatinine Clearance: 61.4 mL/min (by C-G formula based on SCr of 0.9 mg/dL).   Medical History: Past Medical History:  Diagnosis Date  . Anemia   . Arthritis    "all over" (02/01/2018)  . Asthma   . Atrial fibrillation ([place])    on Xarelto  . Breast cancer, left breast ([place]) 05/19/13   left breast stage IIA   . CHF (congestive heart failure) ([place])   . COPD (chronic obstructive pulmonary disease) ([place])   . GERD (gastroesophageal reflux disease)    [name] Prilosec  . Hypertension   . Macular degeneration   . Obstructive jaundice 02/01/2018  . On home oxygen therapy    "2.5L prn" (02/01/2018)  . Osteoarthritis    a. 03/28/11 - R Total Hip Arthroplasty  . Pleurisy   . Pneumonia    "2-3  times" (02/01/2018)   Assessment: 85 yo F presents with incidental finding of hepatic mass. On Xarelto PTA for Afib. Holding for biliary drain and liver biopsy. Last dose was 12/29 in the am. Now s/p biliary drain placement to start heparin as may need another drain as well as pending liver biopsy.  1/6 AM update: back on heparin while awaiting liver biopsy, aPTT is therapeutic at 84, heparin level elevated from Xarelto  Goal of Therapy:  Heparin level 0.3-0.7 units/mL APTT 66-102 secs Monitor platelets by anticoagulation protocol: Yes   Plan:  Cont heparin at 1200 units/hr 1200 aPTT to confirm  Narda Bonds 02/08/2018,6:49 AM

## 2018-02-08 NOTE — Clinical Social Work Note (Signed)
CSW advised of [employer] denial of insurance and MD made aware.Contact made with MD regarding denial and peer-to-peer and he does not want to pursue the peer-to-peer as patient not ready for discharge at this time. [employer] contacted and updated. CSW will continue to follow and facilitate discharge to a skilled nursing facility when medically stable and insurance authorization received.  Pollyann Roa Givens, MSW, LCSW Licensed Clinical Social Worker [facility] [phone]

## 2018-02-08 NOTE — Progress Notes (Addendum)
[place] for Heparin Indication: atrial fibrillation  Allergies  Allergen Reactions  . Methocarbamol Hives  . Vicodin [Hydrocodone-Acetaminophen] Other (See Comments)    Interacts with bp medication and drops blood pressure  . Aspirin Nausea And Vomiting  . Butazolidin [Phenylbutazone] Other (See Comments)    Unknown reaction  . Celebrex [Celecoxib] Nausea And Vomiting  . Codeine Nausea And Vomiting  . Doripenem Rash  . Aspirin Buf(Alhyd-Mghyd-Cacar) Nausea And Vomiting  . Mucinex [Guaifenesin Er] Itching  . Temazepam Other (See Comments)    Unknown reaction   Patient Measurements: Height: 5\' 6"  (167.6 cm) Weight: 249 lb (112.9 kg) IBW/kg (Calculated) : 59.3 Heparin Dosing Weight: 84.8 kg  Vital Signs: Temp: 97.6 F (36.4 C) (01/06 0855) Temp Source: Oral (01/06 0855) BP: 129/56 (01/06 0855) Pulse Rate: 72 (01/06 0855)  Labs: Recent Labs    02/06/18 0641 02/06/18 0839 02/07/18 0359 02/08/18 0531 02/08/18 1140  HGB 10.3*  --  10.4* 10.2*  --   HCT 32.8*  --  33.6* 34.3*  --   PLT 417*  --  471* 438*  --   APTT  --   --   --  84* 91*  HEPARINUNFRC  --   --   --  0.85*  --   CREATININE  --  0.94 0.90 1.23*  --     Estimated Creatinine Clearance: 44.9 mL/min (A) (by C-G formula based on SCr of 1.23 mg/dL (H)).   Medical History: Past Medical History:  Diagnosis Date  . Anemia   . Arthritis    "all over" (02/01/2018)  . Asthma   . Atrial fibrillation ([place])    on Xarelto  . Breast cancer, left breast ([place]) 05/19/13   left breast stage IIA   . CHF (congestive heart failure) ([place])   . COPD (chronic obstructive pulmonary disease) ([place])   . GERD (gastroesophageal reflux disease)    [name] Prilosec  . Hypertension   . Macular degeneration   . Obstructive jaundice 02/01/2018  . On home oxygen therapy    "2.5L prn" (02/01/2018)  . Osteoarthritis    a. 03/28/11 - R Total Hip Arthroplasty  . Pleurisy   . Pneumonia    "2-3 times" (02/01/2018)   Assessment: 85 yo F presents with incidental finding of hepatic mass. On Xarelto PTA for Afib. Holding for biliary drain and liver biopsy. Last dose was 12/29 in the am. Now s/p biliary drain placement to start heparin as may need another drain as well as pending liver biopsy.   aPTT is therapeutic at 91 on 1200 units/hr of heparin, but heparin now on hold per IR for biopsy and drain repair . Will f/u restart of heparin vs rivaroxaban after procedure  Goal of Therapy:  Heparin level 0.3-0.7 units/mL APTT 66-102 secs Monitor platelets by anticoagulation protocol: Yes   Plan:  F/u restart of heparin vs rivaroxaban If heparin restarted, restart at 1200 units/hr  Thamar Holik A. Levada Dy, PharmD, [place] Pager: [phone] Please utilize [facility] for appropriate phone number to reach the unit pharmacist ([place])   02/08/2018,1:46 PM

## 2018-02-08 NOTE — Progress Notes (Signed)
  Echocardiogram 2D Echocardiogram has been performed.  Jordan Byrd 02/08/2018, 10:28 AM

## 2018-02-08 NOTE — Care Management Important Message (Signed)
Important Message  Patient Details  Name: Jordan Byrd MRN: [id number] Date of Birth: 10-Apr-1933   Medicare Important Message Given:  Yes    Rocket Gunderson 02/08/2018, 4:04 PM

## 2018-02-08 NOTE — Progress Notes (Signed)
PROGRESS NOTE    Jordan Byrd  FSF:[id number] DOB: 10-25-1933 DOA: 02/01/2018 PCP: Levin Erp, MD   Brief Narrative:  HPI per Dr. Karmen Bongo on 02/01/18 Jordan Byrd is a 85 y.o. female with medical history significant of HTN; CHF; remote breast cancer; and afib on Xarelto presenting with a fall.  She came in for a fall in the bathroom at 0400.  She fell because she doesn't get around very well.  She has been losing her balance a lot since Thanksgiving and her knee/ankle have been giving out.  Not light-headed or dizzy.  She just loses her balance and falls.  She has not been feeling well since before Thanksgiving.  She is nauseated, difficulty eating, can't do anything, "I mean I'm just sick all over."  She has not been vomiting.  She eats a few bites and feels nauseated and can't continue to eat.  She has been having GERD at night.  They are concerned about weight loss, but she denies weight loss.  No night sweats.  No LAD.  No fevers.  No pain other than her "stomach, it rolls" when she is sick.  Grandson's wife noticed she is yellow but she has not noticed any color change.  Her urine has been "brown, almost" and so they sent a specimen to the office and they ordered lab work for her, not done yet.  ED Course:  Came in for a fall.  Frequent falls, no dizziness or other symptoms prior.  Finger dislocation, relocated and splint in place.  Found to be quite jaundiced.  Bili 13.6.  Has not had imaging to determine etiology for jaundice.  **Percutaneous placement of internal/external biliary drain on right lobe done and they were unable to get good access from the left hepatic ducts. Was placed on empiric heparin drip given unclear plan for biopsy but now that I spoke with Dr. Chilton Si. Jordan Byrd, patient is going to have Brush Bx done as an outpatient next week. PT evaluated and recommending SNF. Will have outpatient Oncology Evaluation once biopsy is done and path is confirmed.   She started  having severe abdominal pain yesterday morning and started wheezing.  Obtained a CT of the abdomen and pelvis as well as a CT of the chest as patient's LFTs and T bili started to go back up again it is felt that the drain is nonfunctioning and malpositioned.  Per IR she will need to be off Xarelto for 2 days for biopsy I placed the patient on a heparin drip last night and she is to go for a IR biopsy and drain placement tomorrow.  Assessment & Plan:   Principal Problem:   Obstructive jaundice Active Problems:   Hypertension   CKD (chronic kidney disease) stage 3, GFR 30-59 ml/min (HCC)   Chronic diastolic heart failure (HCC)   Atrial fibrillation (HCC)   Falls frequently  Obstructive Jaundice abnormal LFTs, alk phos, as well as bilirubin s/p Biliary Drain -Patient presenting with recent frequent falls -Was noted to have marked jaundice on exam -Subsequently noted to have elevated LFTs with bili 13.6 and now was 16.5 with Direct Bilirubin 11.9 and Indirect 4.6 -Was IMPROVING and T Bili 3.2, LFTs showed AST 92 and ALT of 113, and Alk Phos was 552 but now Worsening Slightly as AST is 145, ALT is 245, T bili is 3.2 (yesterday at 3.8), and alk phos is 496 -Since it was not clear where the patient would need to be admitted [facility].  [facility]) or even if she needed to be admitted, CT ordered for further evaluation -CT Abdomen shows a baseball-sized mass in the left lobe of the liver, highly suspicious for cholangiocarcinoma; will repeat CT of the abdomen and pelvis with contrast today given her abdominal pain as well as worsening liver function tests and worsening T bili -Repeat CT Abd/Pelvis showed persistent intrahepatic biliary dilatation involving the left lobe of the liver and with decreased biliary dilatation of the right lobe Jordan Byrd percutaneous drainage -I discussed the case with Dr. Vernard Gambles of IR today and he reviewed the recent CT scan and feels that the drain appears to be malfunctioning currently  and may have been pulled back some during care.  The plan is to bring the patient to IR for 174 brush biopsy as well as the drain revision and she is to be n.p.o. after midnight -GI has been consulted, Dr. Therisa Doyne to see and is IR evaluation for liver biopsy and possible stenting of the intrahepatic bile duct causing obstructive jaundice.  ERCP is not indicated as the common bile duct was of normal caliber -GI sent CEA/CA-19-9/AFP levels -AFP was 6.5, CA-19-9 was 7571, and CA 125 was 67.6 and CEA was 4.0 -IR consult has been requested and she underwent percutaneous placement of an internal/external biliary drain on the right lobe and unable to get good access from the left hepatic ducts.  -Fluid was sent for cytology and culture per IR NOTE and Cytology showed No Malignant Cells -Now on Soft Diet  -Held Xarelto yesterday and placed back on a heparin drip in anticipation for brush biopsy -Pain control with morphine and tramadol if needed; nausea control with Zofran -Continue monitor laboratory data and repeat CMP in the a.m. -Continue to Follow LFTs they are trending back up -PT OT recommending skilled nursing facility so I reached out to social worker for SNF placement; Will need Re-evaluation per my discussion with CSW after her biliary biopsies done  Falls -Suspect that falls are related to underlying malignancy and inability of patient to take PO -Have advance diet to full liquid diet -Will follow -She may benefit from PT evaluation once her trajectory is clarified; she will obtain a breast biopsy in outpatient setting and PT OT has been consulted and recommending skilled nursing facility but Insurance has asked for updated PT/OT notes so will re-consult PT/OT now she is denied for SNF currently but will reevaluate and retry -She lives alone with her grandson next door and she is agreeable to short-term skilled nursing facility placement for rehabilitation  Afib on Xarelto -Rate controlled  with Nebivolol 20 mg p.o. daily -Held Xarelto again and placed on a heparin drip as likely will need biopsy within a few days and she is scheduled for breast biopsy as well as drain revision on 02/09/2018 IR  Chronic Diastolic CHF -Grade 2 Diastolic Function on 7408 echo; Will Repeat ECHO this visit  -Appeared slightly volume overloaded yesterday but appears to be compensated at this time -Chest x-ray this morning showed "Mild cardiomegaly. Mild central pulmonary vascular prominence. Blunting left costophrenic angle similar to prior exams and may be related to prominent epicardial fat pad/subsegmental atelectasis as seen on CT." -Continue to Monitor for signs and symptoms of volume overload carefully now that IVF has been resumed 100 and mils per hour.  We have now stopped IV fluids -Patient is -3.887 L since admission weight is up 7 pounds -Continue with breathing treatments and stop IV fluids at this time -Will give 1x  Dose of IV Lasix 40 mg as she was Wheezing  -Added DuoNeb  AKI on Stage 3 CKD -Patient's BUN/creatinine worsened to 33/1.41 and had improved -In the setting of Nausea and Vomiting, Losartan, and Zosyn -Will D/C Losartan given worsening Renal Fxn -Restarted IVF with NS at 100 mL/hr because of worsening renal function but IVF have stopped and Renal Function is now trending downwards and is much improved to 22/0.90 but acutely worsened yesterday in the setting of IV Lasix -BUN/creatinine is now 32/1.23 -Avoid nephrotoxic medications if possible and continue to closely monitor and will hold further Lasix dosage -Repeat CMP in a.m.  HTN -Continue home meds with Amlodipine 10 mg po Daily, Nebivolol 20 mg po Daily -Held Losartan 100 mg po Daily given acute renal function elevation for now and will continue to hold  Nausea and Vomiting, stable -In the setting of Abdominal Mass -C/w Ondansetron and IV Phenergan  -Symptomatic Treatment  -IVF Stopped may need to be restarted  slowly -Check Repeat CT of the Abdomen and Pelvis  Obesity -Estimated body mass index is 40.19 kg/m as calculated from the following:   Height as of this encounter: 5' 6" (1.676 m).   Weight as of this encounter: 112.9 kg. -Weight Loss Counseling Given   Normocytic Anemia -Patient's hemoglobin/hematocrit went from 10.7/32.5 is now 10. 2/34.3 -Continue to monitor for signs and symptoms of bleeding as she is anticoagulated -Checked Anemia Panel this AM and showed iron level of 39, U IBC of 275, TIBC of 314, saturation ratios of 12%, ferritin level of 73, folate level 26.9, vitamin B12 level of 965 -Repeat CBC in the a.m.  Asymptomatic Bacteuria with Pyurioa  -Had amber color urine, moderate leukocytes, rare bacteria, 21-50 WBCs and urine culture grew out 50,000 colony-forming units of E. coli that was resistant to ampicillin and ampicillin sulbactam -Received a dose of IV Zosyn the day she had a drain placed -Patient not very symptomatic -Still have a leukocytosis of 12.5 but is afebrile and WBC could be from her pain in her back from fall versus suspected cholangiocarcinoma -Repeat U/A and Urine Cx but Do not feel strongly in treating patient  -Patient has no symptoms of burning, discomfort, dysuria, or bladder pain or flank pain; She is Alert and Oriented x3 -Continue monitor and if necessary we will continue urinary tract infection coverage with antibiotics but for now we will hold off as patient is not having symptoms  Dyspnea and Wheezing in a Hx of Asthma and CHF, improved -Worsened since yesterday patient is hyperventilating and stating that it hurts to breathe -Placed on DuoNeb breathing treatments scheduled every 6 hours -Give one-time dose of IV Lasix 40 mg and renal function worsened -Add flutter valve and incentive spirometer -We will give IV Solu-Medrol 60 mg 12 hours will cut down today -CT Angie of the chest done and showed no evidence of pulmonary embolus but there is  multiple small bilateral noncalcified pulmonary nodules measuring up to 7 mm in largest diameter left lobe nonspecific but pulmonary metastasis were not excluded -Chest x-ray findings above -Patient is breathing is improved significantly today -We will wean steroids to 60 mg daily -Monitor respiratory status  Constipation -Patient did not remember when she had a bowel movement last -We will place on senna docusate, MiraLAX 17 g p.o. twice daily, as well as given of bisacodyl suppository  Leukocytosis -In the setting of the steroid demargination -Patient's WBC worsened to 25,000, -Work-up so far is negative for infection -Blood cultures  no growth less than 24 hours -Chest x-ray done and did not show any evidence of any infection -Urinalysis was clear -Continue monitor and wean steroids -Repeat CBC in a.m.  DVT prophylaxis: Anticoagulated with Xarelto Code Status: DO NOT RESUSCITATE Family Communication: Discussed with Family at bedside  Disposition Plan: Main inpatient for further work-up and treatment and stabilization by IR and will brush biopsy  Consultants:   Gastroenterology  Interventional Radiology    Procedures:  PTC and placement of internal/external biliary drain  Findings: Severe intrahepatic biliary dilatation.  Central biliary obstruction.  10 Fr internal / external drain placed from right lobe.  Unable to get good access from left hepatic ducts.     Antimicrobials:  Anti-infectives (From admission, onward)   Start     Dose/Rate Route Frequency Ordered Stop   02/02/18 0900  piperacillin-tazobactam (ZOSYN) IVPB 3.375 g  Status:  Discontinued     3.375 g 100 mL/hr over 30 Minutes Intravenous  Once 02/02/18 0838 02/05/18 1327   02/02/18 0839  piperacillin-tazobactam (ZOSYN) IVPB     over 30 Minutes  Continuous PRN 02/02/18 0839 02/02/18 0839     Subjective: Seen and examined at bedside this AM and she was much improved than yesterday.  Denies any chest pain,  lightheadedness but did still have some abdominal pain.  Shortness of breath is improving and she is not wheezing as badly.  No lightheadedness or dizziness but is very weak.  States that she has not had a bowel movement in several days.  No other concerns at present this time  Objective: Vitals:   02/08/18 0139 02/08/18 0509 02/08/18 0832 02/08/18 0855  BP:  (!) 133/56  (!) 129/56  Pulse:  66  72  Resp:  18  18  Temp:  (!) 97.5 F (36.4 C)  97.6 F (36.4 C)  TempSrc:  Oral  Oral  SpO2: 96% 99% 95% 96%  Weight:      Height:        Intake/Output Summary (Last 24 hours) at 02/08/2018 1036 Last data filed at 02/08/2018 0912 Gross per 24 hour  Intake 635.95 ml  Output 1975 ml  Net -1339.05 ml   Filed Weights   02/04/18 0504 02/06/18 0120 02/07/18 0120  Weight: 113.4 kg 113.4 kg 112.9 kg   Examination: Physical Exam:  Constitutional: Well-nourished, well-developed obese Caucasian female currently no acute distress appears anxious but is still slightly uncomfortable.  Nausea and vomiting have improved and she is not as dyspneic today and appears better than yesterday Eyes: Conjunctive are normal.  Sclera anicteric ENMT: External ears and nose appear normal.  Grossly normal hearing Neck: Appears supple no JVD Respiratory: Diminished to auscultation bilaterally with no appreciable wheezing today and still has slightly coarse breath sounds but is improved from yesterday.  Unlabored breathing and she is not tachypneic and she is not on oxygen via nasal cannula Cardiovascular: Slight tachycardia but regular rhythm.  No appreciable murmurs, rubs, gallops. Abdomen: Soft, tender to palpate slightly.  Distended second body habitus.  Has a biliary drain with minimal output but it is dark in color GU: Deferred Musculoskeletal: No contractures or cyanosis.  Wearing a splint on her right index finger Skin: Still slightly jaundiced.  No appreciable rashes or lesions limited skin  evaluation Neurologic: Cranial nerves II through XII grossly intact no appreciable focal deficits Psychiatric: Is awake and alert and oriented x3.  Not as confused.  Not as anxious  Data Reviewed: I have personally reviewed following labs and  imaging studies  CBC: Recent Labs  Lab 02/03/18 0512 02/04/18 0117 02/05/18 0751 02/06/18 0641 02/07/18 0359 02/08/18 0531  WBC 12.1* 11.8* 11.4* 12.5* 11.1* 25.0*  NEUTROABS 9.8* 8.8* 8.1*  --  7.8* 24.8*  HGB 9.8* 10.1* 10.2* 10.3* 10.4* 10.2*  HCT 31.5* 31.2* 32.5* 32.8* 33.6* 34.3*  MCV 88.2 89.4 91.5 91.4 92.8 94.2  PLT 339 386 412* 417* 471* 076*   Basic Metabolic Panel: Recent Labs  Lab 02/04/18 0117 02/05/18 0751 02/06/18 0839 02/07/18 0359 02/08/18 0531  NA 137 137 136 136 134*  K 4.5 4.6 4.4 4.7 4.6  CL 104 106 101 100 100  CO2 _0 20*  GLUCOSE 126* 107* 142* 92 154*  BUN 30* _1 32*  CREATININE 1.24* 0.88 0.94 0.90 1.23*  CALCIUM 8.2* 8.7* 8.8* 8.8* 8.5*  MG 2.0 2.0 1.8 2.0 2.1  PHOS 4.6 3.9 4.3 4.3 5.1*   GFR: Estimated Creatinine Clearance: 44.9 mL/min (A) (by C-G formula based on SCr of 1.23 mg/dL (H)). Liver Function Tests: Recent Labs  Lab 02/04/18 0117 02/05/18 0751 02/06/18 0839 02/07/18 0359 02/08/18 0531  AST 58* 52* 92* 135* 145*  ALT 149* 110* 133* 175* 245*  ALKPHOS 746* 641* 552* 498* 496*  BILITOT 4.5* 4.1* 3.2* 3.8* 3.2*  PROT 5.6* 6.0* 6.3* 6.0* 6.4*  ALBUMIN 2.1* 2.1* 2.1* 2.1* 2.3*   No results for input(s): LIPASE, AMYLASE in the last 168 hours. No results for input(s): AMMONIA in the last 168 hours. Coagulation Profile: Recent Labs  Lab 02/01/18 1712  INR 1.16   Cardiac Enzymes: No results for input(s): CKTOTAL, CKMB, CKMBINDEX, TROPONINI in the last 168 hours. BNP (last 3 results) No results for input(s): PROBNP in the last 8760 hours. HbA1C: No results for input(s): HGBA1C in the last 72 hours. CBG: No results for input(s): GLUCAP in the last 168 hours. Lipid  Profile: No results for input(s): CHOL, HDL, LDLCALC, TRIG, CHOLHDL, LDLDIRECT in the last 72 hours. Thyroid Function Tests: No results for input(s): TSH, T4TOTAL, FREET4, T3FREE, THYROIDAB in the last 72 hours. Anemia Panel: No results for input(s): VITAMINB12, FOLATE, FERRITIN, TIBC, IRON, RETICCTPCT in the last 72 hours. Sepsis Labs: No results for input(s): PROCALCITON, LATICACIDVEN in the last 168 hours.  Recent Results (from the past 240 hour(s))  Urine culture     Status: Abnormal   Collection Time: 02/01/18  9:46 AM  Result Value Ref Range Status   Specimen Description URINE, RANDOM  Final   Special Requests   Final    NONE Performed at [facility], [address], [place], [place] [address]    Culture 50,000 COLONIES/mL ESCHERICHIA COLI (A)  Final   Report Status 02/03/2018 FINAL  Final   Organism ID, Bacteria ESCHERICHIA COLI (A)  Final      Susceptibility   Escherichia coli - MIC*    AMPICILLIN >=32 RESISTANT Resistant     CEFAZOLIN <=4 SENSITIVE Sensitive     CEFTRIAXONE <=1 SENSITIVE Sensitive     CIPROFLOXACIN <=0.25 SENSITIVE Sensitive     GENTAMICIN <=1 SENSITIVE Sensitive     IMIPENEM <=0.25 SENSITIVE Sensitive     NITROFURANTOIN <=16 SENSITIVE Sensitive     TRIMETH/SULFA <=20 SENSITIVE Sensitive     AMPICILLIN/SULBACTAM >=32 RESISTANT Resistant     PIP/TAZO <=4 SENSITIVE Sensitive     Extended ESBL NEGATIVE Sensitive     * 50,000 COLONIES/mL ESCHERICHIA COLI  Gram stain     Status: None  Collection Time: 02/02/18  1:00 PM  Result Value Ref Range Status   Specimen Description FLUID  Final   Special Requests   Final    NONE Performed at [facility], [place] [address], [place], [place] [address]    Gram Stain   Final    CYTOSPIN SMEAR WBC PRESENT, PREDOMINANTLY PMN NO ORGANISMS SEEN    Report Status 02/02/2018 FINAL  Final  Culture, body fluid-bottle     Status: None   Collection Time: 02/02/18  1:00 PM  Result Value Ref Range Status    Specimen Description FLUID  Final   Special Requests   Final    NONE Performed at [facility], [place] [address], [place], [place] [address]    Culture NO GROWTH 5 DAYS  Final   Report Status 02/07/2018 FINAL  Final  Culture, Urine     Status: None   Collection Time: 02/06/18 10:12 AM  Result Value Ref Range Status   Specimen Description URINE, RANDOM  Final   Special Requests NONE  Final   Culture   Final    NO GROWTH Performed at [facility], [place] [address] [place], [place] [address]    Report Status 02/07/2018 FINAL  Final    Radiology Studies: Ct Angio Chest Pe W Or Wo Contrast  Result Date: 02/07/2018 CLINICAL DATA:  Dyspnea, wheezing, history asthma, CHF, worsened symptoms since yesterday, hyperventilation, abdominal distension; history of biliary dilatation and central biliary obstruction post percutaneous drainage EXAM: CT ANGIOGRAPHY CHEST CT ABDOMEN AND PELVIS WITH CONTRAST TECHNIQUE: Multidetector CT imaging of the chest was performed using the standard protocol during bolus administration of intravenous contrast. Multiplanar CT image reconstructions and MIPs were obtained to evaluate the vascular anatomy. Multidetector CT imaging of the abdomen and pelvis was performed using the standard protocol during bolus administration of intravenous contrast. CONTRAST:  176m ISOVUE-370 IOPAMIDOL (ISOVUE-370) INJECTION 76% IV. COMPARISON:  CT abdomen and pelvis 02/01/2018 FINDINGS: CTA CHEST FINDINGS Cardiovascular: Atherosclerotic calcifications of aorta, proximal great vessels and coronary arteries. Cardiac chambers appear mildly enlarged. No pericardial effusion. Pulmonary arteries adequately opacified and patent. No evidence of pulmonary embolism. Mediastinum/Nodes: Base of cervical region normal appearance. No thoracic adenopathy. Esophagus unremarkable. Lungs/Pleura: Small focus of infiltrate or atelectasis at anterior LEFT upper lobe image 51. Multiple tiny BILATERAL lung  nodules, noncalcified. 7 mm LEFT lower lobe nodule image 50. Remaining lungs clear. No infiltrate, pleural effusion or pneumothorax Musculoskeletal: Osseous demineralization. Prior cervical spine fusion. No definite bone lesions. Review of the MIP images confirms the above findings. CT ABDOMEN and PELVIS FINDINGS Hepatobiliary: Percutaneous biliary drain decompression of the RIGHT biliary system since previous exam. Persistent dilatation of the intrahepatic biliary radicles of the LEFT lobe. No focal hepatic mass lesion or visualized obstructing mass at the central biliary tree. Gallbladder surgically absent. Pancreas: Normal appearance Spleen: Normal appearance Adrenals/Urinary Tract: Adrenal glands normal appearance. Tiny cyst at inferior pole RIGHT kidney. Kidneys, ureters, and bladder otherwise unremarkable. Stomach/Bowel: Appendix surgically absent by history. Dense retained contrast in transverse colon. Stomach decompressed. Stomach and bowel loops otherwise unremarkable. Vascular/Lymphatic: Atherosclerotic calcifications of aorta and iliac arteries. Aorta normal caliber. No adenopathy. Reproductive: Uterus surgically absent with nonvisualization of ovaries Other: No free air or free fluid. No hernia or acute inflammatory process. Musculoskeletal: No acute osseous lesions. RIGHT hip prosthesis noted. Degenerative disc and facet disease changes lumbar spine. Review of the MIP images confirms the above findings. IMPRESSION: No evidence of pulmonary embolism. Multiple small  BILATERAL noncalcified pulmonary nodules measuring up to 7 mm in largest diameter in LEFT lobe, nonspecific but pulmonary metastases are not excluded. Persistent intrahepatic biliary dilatation involving the LEFT lobe of the liver with decreased biliary dilatation of the RIGHT lobe biliary tree post percutaneous drainage. Scattered atherosclerotic calcifications including within coronary arteries. Electronically Signed   By: Lavonia Dana M.D.    On: 02/07/2018 17:33   Ct Abdomen Pelvis W Contrast  Result Date: 02/07/2018 CLINICAL DATA:  Dyspnea, wheezing, history asthma, CHF, worsened symptoms since yesterday, hyperventilation, abdominal distension; history of biliary dilatation and central biliary obstruction post percutaneous drainage EXAM: CT ANGIOGRAPHY CHEST CT ABDOMEN AND PELVIS WITH CONTRAST TECHNIQUE: Multidetector CT imaging of the chest was performed using the standard protocol during bolus administration of intravenous contrast. Multiplanar CT image reconstructions and MIPs were obtained to evaluate the vascular anatomy. Multidetector CT imaging of the abdomen and pelvis was performed using the standard protocol during bolus administration of intravenous contrast. CONTRAST:  127m ISOVUE-370 IOPAMIDOL (ISOVUE-370) INJECTION 76% IV. COMPARISON:  CT abdomen and pelvis 02/01/2018 FINDINGS: CTA CHEST FINDINGS Cardiovascular: Atherosclerotic calcifications of aorta, proximal great vessels and coronary arteries. Cardiac chambers appear mildly enlarged. No pericardial effusion. Pulmonary arteries adequately opacified and patent. No evidence of pulmonary embolism. Mediastinum/Nodes: Base of cervical region normal appearance. No thoracic adenopathy. Esophagus unremarkable. Lungs/Pleura: Small focus of infiltrate or atelectasis at anterior LEFT upper lobe image 51. Multiple tiny BILATERAL lung nodules, noncalcified. 7 mm LEFT lower lobe nodule image 50. Remaining lungs clear. No infiltrate, pleural effusion or pneumothorax Musculoskeletal: Osseous demineralization. Prior cervical spine fusion. No definite bone lesions. Review of the MIP images confirms the above findings. CT ABDOMEN and PELVIS FINDINGS Hepatobiliary: Percutaneous biliary drain decompression of the RIGHT biliary system since previous exam. Persistent dilatation of the intrahepatic biliary radicles of the LEFT lobe. No focal hepatic mass lesion or visualized obstructing mass at the  central biliary tree. Gallbladder surgically absent. Pancreas: Normal appearance Spleen: Normal appearance Adrenals/Urinary Tract: Adrenal glands normal appearance. Tiny cyst at inferior pole RIGHT kidney. Kidneys, ureters, and bladder otherwise unremarkable. Stomach/Bowel: Appendix surgically absent by history. Dense retained contrast in transverse colon. Stomach decompressed. Stomach and bowel loops otherwise unremarkable. Vascular/Lymphatic: Atherosclerotic calcifications of aorta and iliac arteries. Aorta normal caliber. No adenopathy. Reproductive: Uterus surgically absent with nonvisualization of ovaries Other: No free air or free fluid. No hernia or acute inflammatory process. Musculoskeletal: No acute osseous lesions. RIGHT hip prosthesis noted. Degenerative disc and facet disease changes lumbar spine. Review of the MIP images confirms the above findings. IMPRESSION: No evidence of pulmonary embolism. Multiple small BILATERAL noncalcified pulmonary nodules measuring up to 7 mm in largest diameter in LEFT lobe, nonspecific but pulmonary metastases are not excluded. Persistent intrahepatic biliary dilatation involving the LEFT lobe of the liver with decreased biliary dilatation of the RIGHT lobe biliary tree post percutaneous drainage. Scattered atherosclerotic calcifications including within coronary arteries. Electronically Signed   By: MLavonia DanaM.D.   On: 02/07/2018 17:33   Dg Chest Port 1 View  Result Date: 02/08/2018 CLINICAL DATA:  85year old female with shortness breath for the past week. Subsequent encounter. EXAM: PORTABLE CHEST 1 VIEW COMPARISON:  02/07/2018 chest CT and chest x-ray. FINDINGS: Mild cardiomegaly. Central pulmonary vascular prominence. No segmental consolidation or pneumothorax. CT detected pulmonary nodules not appreciated on present plain film exam. Calcified aorta. Calcified left carotid bifurcation. Bilateral acromioclavicular joint degenerative changes. Prior surgery lower  cervical spine. IMPRESSION: 1. Mild cardiomegaly. 2. Mild central pulmonary vascular  prominence. 3. Blunting left costophrenic angle similar to prior exams and may be related to prominent epicardial fat pad/subsegmental atelectasis as seen on CT. 4. CT detected pulmonary nodules not appreciated on present plain film exam. Electronically Signed   By: Genia Del M.D.   On: 02/08/2018 07:05   Dg Chest Port 1 View  Result Date: 02/07/2018 CLINICAL DATA:  Shortness of breath with altered mental status. EXAM: PORTABLE CHEST 1 VIEW COMPARISON:  02/09/2015 FINDINGS: Lungs are adequately inflated without focal airspace consolidation or effusion. Cardiomediastinal silhouette and remainder of the exam is unchanged. IMPRESSION: No active disease. Electronically Signed   By: Marin Olp M.D.   On: 02/07/2018 12:37   Scheduled Meds: . amLODipine  10 mg Oral Daily  . feeding supplement (ENSURE ENLIVE)  237 mL Oral BID BM  . methylPREDNISolone (SOLU-MEDROL) injection  60 mg Intravenous Q12H  . mometasone-formoterol  2 puff Inhalation BID  . nebivolol  20 mg Oral Daily  . traMADol  50 mg Oral BID   Continuous Infusions: . heparin 1,200 Units/hr (02/07/18 2200)    LOS: 7 days   Kerney Elbe, DO [facility] PAGER is on Hall  If 7PM-7AM, please contact night-coverage [email or web address] Password [facility] 02/08/2018, 10:36 AM

## 2018-02-09 ENCOUNTER — Inpatient Hospital Stay (HOSPITAL_COMMUNITY): Payer: PPO

## 2018-02-09 ENCOUNTER — Encounter (HOSPITAL_COMMUNITY): Payer: Self-pay | Admitting: Interventional Radiology

## 2018-02-09 HISTORY — PX: IR ENDOLUMINAL BX OF BILIARY TREE: IMG6053

## 2018-02-09 HISTORY — PX: IR EXCHANGE BILIARY DRAIN: IMG6046

## 2018-02-09 LAB — COMPREHENSIVE METABOLIC PANEL
ALBUMIN: 2.3 g/dL — AB (ref 3.5–5.0)
ALT: 179 U/L — ABNORMAL HIGH (ref 0–44)
AST: 72 U/L — ABNORMAL HIGH (ref 15–41)
Alkaline Phosphatase: 424 U/L — ABNORMAL HIGH (ref 38–126)
Anion gap: 10 (ref 5–15)
BILIRUBIN TOTAL: 2.7 mg/dL — AB (ref 0.3–1.2)
BUN: 40 mg/dL — ABNORMAL HIGH (ref 8–23)
CO2: 27 mmol/L (ref 22–32)
Calcium: 8.8 mg/dL — ABNORMAL LOW (ref 8.9–10.3)
Chloride: 101 mmol/L (ref 98–111)
Creatinine, Ser: 1.04 mg/dL — ABNORMAL HIGH (ref 0.44–1.00)
GFR calc Af Amer: 58 mL/min — ABNORMAL LOW (ref >60)
GFR calc non Af Amer: 50 mL/min — ABNORMAL LOW (ref >60)
Glucose, Bld: 111 mg/dL — ABNORMAL HIGH (ref 70–99)
Potassium: 4.1 mmol/L (ref 3.5–5.1)
Sodium: 138 mmol/L (ref 135–145)
Total Protein: 6.6 g/dL (ref 6.5–8.1)

## 2018-02-09 LAB — CBC WITH DIFFERENTIAL/PLATELET
Abs Immature Granulocytes: 0.11 10*3/uL — ABNORMAL HIGH (ref 0.00–0.07)
Basophils Absolute: 0 10*3/uL (ref 0.0–0.1)
Basophils Relative: 0 %
Eosinophils Absolute: 0 10*3/uL (ref 0.0–0.5)
Eosinophils Relative: 0 %
HEMATOCRIT: 34 % — AB (ref 36.0–46.0)
Hemoglobin: 10.5 g/dL — ABNORMAL LOW (ref 12.0–15.0)
Immature Granulocytes: 1 %
Lymphocytes Relative: 4 %
Lymphs Abs: 0.7 10*3/uL (ref 0.7–4.0)
MCH: 28.8 pg (ref 26.0–34.0)
MCHC: 30.9 g/dL (ref 30.0–36.0)
MCV: 93.2 fL (ref 80.0–100.0)
Monocytes Absolute: 1 10*3/uL (ref 0.1–1.0)
Monocytes Relative: 6 %
NEUTROS PCT: 89 %
Neutro Abs: 15.2 10*3/uL — ABNORMAL HIGH (ref 1.7–7.7)
Platelets: 500 10*3/uL — ABNORMAL HIGH (ref 150–400)
RBC: 3.65 MIL/uL — ABNORMAL LOW (ref 3.87–5.11)
RDW: 18.4 % — ABNORMAL HIGH (ref 11.5–15.5)
WBC: 17 10*3/uL — ABNORMAL HIGH (ref 4.0–10.5)
nRBC: 0.1 % (ref 0.0–0.2)

## 2018-02-09 LAB — APTT: aPTT: 35 seconds (ref 24–36)

## 2018-02-09 LAB — MAGNESIUM: Magnesium: 2.4 mg/dL (ref 1.7–2.4)

## 2018-02-09 LAB — PROTIME-INR
INR: 1.1
Prothrombin Time: 14.1 seconds (ref 11.4–15.2)

## 2018-02-09 LAB — PHOSPHORUS: Phosphorus: 3.8 mg/dL (ref 2.5–4.6)

## 2018-02-09 MED ORDER — LIDOCAINE HCL (PF) 1 % IJ SOLN
INTRAMUSCULAR | Status: AC | PRN
  Administered 2018-02-09: 5 mL

## 2018-02-09 MED ORDER — FENTANYL CITRATE (PF) 100 MCG/2ML IJ SOLN
INTRAMUSCULAR | Status: AC | PRN
  Administered 2018-02-09 (×2): 25 ug via INTRAVENOUS
  Administered 2018-02-09: 50 ug via INTRAVENOUS
  Administered 2018-02-09: 25 ug via INTRAVENOUS

## 2018-02-09 MED ORDER — LIDOCAINE HCL 1 % IJ SOLN
INTRAMUSCULAR | Status: AC
  Filled 2018-02-09: qty 20

## 2018-02-09 MED ORDER — MIDAZOLAM HCL 2 MG/2ML IJ SOLN
INTRAMUSCULAR | Status: AC | PRN
  Administered 2018-02-09: 1 mg via INTRAVENOUS
  Administered 2018-02-09: 0.5 mg via INTRAVENOUS
  Administered 2018-02-09: 1 mg via INTRAVENOUS

## 2018-02-09 MED ORDER — IOPAMIDOL (ISOVUE-300) INJECTION 61%
INTRAVENOUS | Status: AC
  Administered 2018-02-09: 15 mL
  Filled 2018-02-09: qty 50

## 2018-02-09 MED ORDER — SODIUM CHLORIDE 0.9 % IV SOLN
INTRAVENOUS | Status: AC | PRN
  Administered 2018-02-09: 10 mL/h via INTRAVENOUS

## 2018-02-09 MED ORDER — MIDAZOLAM HCL 2 MG/2ML IJ SOLN
INTRAMUSCULAR | Status: AC
  Filled 2018-02-09: qty 4

## 2018-02-09 MED ORDER — SODIUM CHLORIDE 0.9% FLUSH
5.0000 mL | Freq: Three times a day (TID) | INTRAVENOUS | Status: DC
  Administered 2018-02-09 – 2018-02-11 (×5): 5 mL
  Administered 2018-02-12: 3 mL
  Administered 2018-02-12: 5 mL

## 2018-02-09 MED ORDER — FENTANYL CITRATE (PF) 100 MCG/2ML IJ SOLN
INTRAMUSCULAR | Status: AC
  Filled 2018-02-09: qty 4

## 2018-02-09 MED ORDER — METHYLPREDNISOLONE SODIUM SUCC 40 MG IJ SOLR
40.0000 mg | Freq: Every day | INTRAMUSCULAR | Status: DC
  Administered 2018-02-10: 40 mg via INTRAVENOUS
  Filled 2018-02-09: qty 1

## 2018-02-09 MED ORDER — HEPARIN (PORCINE) 25000 UT/250ML-% IV SOLN
1300.0000 [IU]/h | INTRAVENOUS | Status: DC
  Administered 2018-02-09 – 2018-02-10 (×2): 1200 [IU]/h via INTRAVENOUS
  Administered 2018-02-11: 1300 [IU]/h via INTRAVENOUS
  Filled 2018-02-09 (×4): qty 250

## 2018-02-09 NOTE — Progress Notes (Signed)
[place] for Heparin Indication: atrial fibrillation  Allergies  Allergen Reactions  . Methocarbamol Hives  . Vicodin [Hydrocodone-Acetaminophen] Other (See Comments)    Interacts with bp medication and drops blood pressure  . Aspirin Nausea And Vomiting  . Butazolidin [Phenylbutazone] Other (See Comments)    Unknown reaction  . Celebrex [Celecoxib] Nausea And Vomiting  . Codeine Nausea And Vomiting  . Doripenem Rash  . Aspirin Buf(Alhyd-Mghyd-Cacar) Nausea And Vomiting  . Mucinex [Guaifenesin Er] Itching  . Temazepam Other (See Comments)    Unknown reaction   Patient Measurements: Height: 5\' 6"  (167.6 cm) Weight: 249 lb 1.9 oz (113 kg) IBW/kg (Calculated) : 59.3 Heparin Dosing Weight: 84.8 kg  Vital Signs: Temp: 97.8 F (36.6 C) (01/07 0427) Temp Source: Oral (01/07 0427) BP: 154/57 (01/07 1020) Pulse Rate: 73 (01/07 1020)  Labs: Recent Labs    02/07/18 0359 02/08/18 0531 02/08/18 1140 02/09/18 0512  HGB 10.4* 10.2*  --   --   HCT 33.6* 34.3*  --   --   PLT 471* 438*  --   --   APTT  --  84* 91*  --   LABPROT  --   --   --  14.1  INR  --   --   --  1.10  HEPARINUNFRC  --  0.85*  --   --   CREATININE 0.90 1.23*  --   --     Estimated Creatinine Clearance: 45 mL/min (A) (by C-G formula based on SCr of 1.23 mg/dL (H)).   Medical History: Past Medical History:  Diagnosis Date  . Anemia   . Arthritis    "all over" (02/01/2018)  . Asthma   . Atrial fibrillation ([place])    on Xarelto  . Breast cancer, left breast ([place]) 05/19/13   left breast stage IIA   . CHF (congestive heart failure) ([place])   . COPD (chronic obstructive pulmonary disease) ([place])   . GERD (gastroesophageal reflux disease)    [name] Prilosec  . Hypertension   . Macular degeneration   . Obstructive jaundice 02/01/2018  . On home oxygen therapy    "2.5L prn" (02/01/2018)  . Osteoarthritis    a. 03/28/11 - R Total Hip Arthroplasty  . Pleurisy   .  Pneumonia    "2-3 times" (02/01/2018)   Assessment: 85 yo F presents with incidental finding of hepatic mass. On Xarelto PTA for Afib. Holding for biliary drain and liver biopsy. Last dose was 12/29 in the am. Now s/p biliary drain placement to start heparin as may need another drain as well as pending liver biopsy.  Patient now s/p biliary drain revision and brush biopsy. Patient to restart heparin 4 hours after procedure per protocol for low bleeding risk (per IR consult). Will restart at 1200 units/hr with no bolus at 1430 and get 8 hour HL and APTT.   Goal of Therapy:  Heparin level 0.3-0.7 units/mL APTT 66-102 secs Monitor platelets by anticoagulation protocol: Yes   Plan:  Restart heparin at 1200 units/hr at 1430 (4 hours after procedure) HL/APTT at 2230 Daily HL/APTT   Nesanel Aguila A. Levada Dy, PharmD, [place] Pager: [phone] Please utilize Amion for appropriate phone number to reach the unit pharmacist ([place])   02/09/2018,10:37 AM

## 2018-02-09 NOTE — Procedures (Signed)
Interventional Radiology Procedure Note  Procedure:  Exchange of retracted right PTC/drain of biliary tree.  New 47F biliary drain placed.  Brush biopsy x 2 of the tight lesion of biliary tree.  .  Complications: None  Recommendations:  - Drain to gravity.  Routine care - follow up biliary biopsy - Do not submerge  - Routine wound care   Signed,  Dulcy Fanny. Earleen Newport, DO

## 2018-02-09 NOTE — Progress Notes (Signed)
PROGRESS NOTE    Jordan Byrd  HLK:[id number] DOB: 1933/08/21 DOA: 02/01/2018 PCP: Levin Erp, MD   Brief Narrative:  HPI per Dr. Karmen Bongo on 02/01/18 Jordan Byrd is a 85 y.o. female with medical history significant of HTN; CHF; remote breast cancer; and afib on Xarelto presenting with a fall.  She came in for a fall in the bathroom at 0400.  She fell because she doesn't get around very well.  She has been losing her balance a lot since Thanksgiving and her knee/ankle have been giving out.  Not light-headed or dizzy.  She just loses her balance and falls.  She has not been feeling well since before Thanksgiving.  She is nauseated, difficulty eating, can't do anything, "I mean I'm just sick all over."  She has not been vomiting.  She eats a few bites and feels nauseated and can't continue to eat.  She has been having GERD at night.  They are concerned about weight loss, but she denies weight loss.  No night sweats.  No LAD.  No fevers.  No pain other than her "stomach, it rolls" when she is sick.  Grandson's wife noticed she is yellow but she has not noticed any color change.  Her urine has been "brown, almost" and so they sent a specimen to the office and they ordered lab work for her, not done yet.  ED Course:  Came in for a fall.  Frequent falls, no dizziness or other symptoms prior.  Finger dislocation, relocated and splint in place.  Found to be quite jaundiced.  Bili 13.6.  Has not had imaging to determine etiology for jaundice.  **Percutaneous placement of internal/external biliary drain on right lobe done and they were unable to get good access from the left hepatic ducts. Was placed on empiric heparin drip given unclear plan for biopsy but now that I spoke with Dr. Chilton Si. [name], patient is going to have Brush Bx done as an outpatient next week. PT evaluated and recommending SNF. Will have outpatient Oncology Evaluation once biopsy is done and path is confirmed.   She started  having severe abdominal pain yesterday morning and started wheezing.  Obtained a CT of the abdomen and pelvis as well as a CT of the chest as patient's LFTs and T bili started to go back up again it is felt that the drain is nonfunctioning and malpositioned.  Per IR she will need to be off Xarelto for 2 days for biopsy I placed the patient on a heparin drip onm 02/07/2018 and she is to go for a IR biopsy (Brush Bx) and drain replacement and had this done today.  Assessment & Plan:   Principal Problem:   Obstructive jaundice Active Problems:   Hypertension   CKD (chronic kidney disease) stage 3, GFR 30-59 ml/min (HCC)   Chronic diastolic heart failure (HCC)   Atrial fibrillation (HCC)   Falls frequently  Obstructive Jaundice abnormal LFTs, alk phos, as well as bilirubin s/p Biliary Drain -Patient presenting with recent frequent falls -Was noted to have marked jaundice on exam -Subsequently noted to have elevated LFTs with bili 13.6 and now was 16.5 with Direct Bilirubin 11.9 and Indirect 4.6 -Improving as Drain was Replaced as alk phos is now 424, AST 72, ALT is 129, T bili is 2.7 -Since it was not clear where the patient would need to be admitted [facility]) or even if she needed to be admitted, CT ordered for further evaluation -CT Abdomen  shows a baseball-sized mass in the left lobe of the liver, highly suspicious for cholangiocarcinoma; will repeat CT of the abdomen and pelvis with contrast today given her abdominal pain as well as worsening liver function tests and worsening T bili -Repeat CT Abd/Pelvis showed persistent intrahepatic biliary dilatation involving the left lobe of the liver and with decreased biliary dilatation of the right lobe Jordan Byrd percutaneous drainage -I discussed the case with Dr. Vernard Gambles of IR today and he reviewed the recent CT scan and feels that the drain appears to be malfunctioning currently and may have been pulled back some during care.  The plan is to bring the  patient to IR for brush biopsy and drain replacement and this was done today -GI has been consulted, Dr. Therisa Doyne to see and is IR evaluation for liver biopsy and possible stenting of the intrahepatic bile duct causing obstructive jaundice.  ERCP is not indicated as the common bile duct was of normal caliber -GI sent CEA/CA-19-9/AFP levels -AFP was 6.5, CA-19-9 was 7571, and CA 125 was 67.6 and CEA was 4.0 -IR consult has been requested and she underwent percutaneous placement of an internal/external biliary drain on the right lobe and unable to get good access from the left hepatic ducts.  -Fluid was sent for cytology and culture per IR NOTE and Cytology showed No Malignant Cells -Now on Soft Diet  -Held Xarelto yesterday and placed back on a heparin drip in anticipation for brush biopsy and she had a brush biopsy done today -Pain control with morphine and tramadol if needed; nausea control with Zofran -Continue monitor laboratory data and repeat CMP in the a.m. -Continue to Follow LFTs they are trending back up -PT OT recommending skilled nursing facility so I reached out to social worker for SNF placement; Will need Re-evaluation per my discussion with CSW after her biliary biopsies done  Falls -Suspect that falls are related to underlying malignancy and inability of patient to take PO -Have advance diet to full liquid diet -Will follow -She may benefit from PT evaluation once her trajectory is clarified; she will obtain a breast biopsy in outpatient setting and PT OT has been consulted and recommending skilled nursing facility but Insurance has asked for updated PT/OT notes so will re-consult PT/OT now she is denied for SNF currently but will reevaluate and retry -She lives alone with her grandson next door and she is agreeable to short-term skilled nursing facility placement for rehabilitation  Afib on Xarelto -Rate controlled with Nebivolol 20 mg p.o. daily -Held Xarelto again and placed  on a heparin drip as likely will need biopsy within a few days and she is scheduled for breast biopsy as well as drain revision on 02/09/2018 by IR -We will likely need to be placed back on her Xarelto and defer when she can go back safely per IR  Chronic Diastolic CHF -Grade 2 Diastolic Function on 9518 echo; Will Repeat ECHO this visit  -Appeared slightly volume overloaded yesterday but appears to be compensated at this time -Chest x-ray this morning showed "Mild cardiomegaly. Mild central pulmonary vascular prominence. Blunting left costophrenic angle similar to prior exams and may be related to prominent epicardial fat pad/subsegmental atelectasis as seen on CT." -Continue to Monitor for signs and symptoms of volume overload carefully now that IVF has been resumed 100 and mils per hour.  We have now stopped IV fluids -Patient is - 1.457 L since admission weight is up 6 pounds -Continue with breathing treatments and stop  IV fluids at this time -Will give 1x Dose of IV Lasix 40 mg as she was Wheezing  -Added DuoNeb  AKI on Stage 3 CKD, improving again -Patient's BUN/creatinine worsened to 33/1.41 and had improved -In the setting of Nausea and Vomiting, Losartan, and Zosyn -Will D/C Losartan given worsening Renal Fxn -Restarted IVF with NS at 100 mL/hr because of worsening renal function but IVF have stopped and Renal Function is now trending downwards and is much improved to 22/0.90 but acutely worsened yesterday in the setting of IV Lasix -BUN/creatinine yesterday was 32/1.23 today it is 40/1.04 -Avoid nephrotoxic medications if possible and continue to closely monitor and will hold further Lasix dosage -Repeat CMP in a.m.  HTN -Continue home meds with Amlodipine 10 mg po Daily, Nebivolol 20 mg po Daily -Held Losartan 100 mg po Daily given acute renal function elevation for now and will continue to hold  Nausea and Vomiting, stable -In the setting of Abdominal Mass -C/w Ondansetron  and IV Phenergan  -Symptomatic Treatment  -IVF Stopped may need to be restarted slowly -Check Repeat CT of the Abdomen and Pelvis  Obesity -Estimated body mass index is 40.14 kg/m as calculated from the following:   Height as of this encounter: '5\' 6"'$  (1.676 m).   Weight as of this encounter: 112.8 kg. -Weight Loss Counseling Given   Right shoulder pain -In the setting of worsened she had a drain placement -Continue monitor  Normocytic Anemia -Patient's hemoglobin/hematocrit went from 10.7/32.5 is now 10.5/34.0 -Continue to monitor for signs and symptoms of bleeding as she is anticoagulated -Checked Anemia Panel this AM and showed iron level of 39, U IBC of 275, TIBC of 314, saturation ratios of 12%, ferritin level of 73, folate level 26.9, vitamin B12 level of 965 -Repeat CBC in the a.m.  Asymptomatic Bacteuria with Pyuria  -Had amber color urine, moderate leukocytes, rare bacteria, 21-50 WBCs and urine culture grew out 50,000 colony-forming units of E. coli that was resistant to ampicillin and ampicillin sulbactam -Received a dose of IV Zosyn the day she had a drain placed -Patient not very symptomatic -Still have a leukocytosis of 12.5 but is afebrile and WBC could be from her pain in her back from fall versus suspected cholangiocarcinoma -Repeat U/A and Urine Cx but Do not feel strongly in treating patient  -Patient has no symptoms of burning, discomfort, dysuria, or bladder pain or flank pain; She is Alert and Oriented x3 -Continue monitor and if necessary we will continue urinary tract infection coverage with antibiotics but for now we will hold off as patient is not having symptoms  Dyspnea and Wheezing in a Hx of Asthma and CHF, improved -Worsened since yesterday patient is hyperventilating and stating that it hurts to breathe -Placed on DuoNeb breathing treatments scheduled every 6 hours -Give one-time dose of IV Lasix 40 mg and renal function worsened -Add flutter valve  and incentive spirometer -We will give IV Solu-Medrol 60 mg 12 hours will cut down and wean to 40 IV daily -CT Angie of the chest done and showed no evidence of pulmonary embolus but there is multiple small bilateral noncalcified pulmonary nodules measuring up to 7 mm in largest diameter left lobe nonspecific but pulmonary metastasis were not excluded -Chest x-ray findings above -Patient is breathing is improved significantly today -We will wean steroids to 60 mg daily -Continue to Monitor respiratory status  Constipation -Patient did not remember when she had a bowel movement last -We will place on senna docusate,  MiraLAX 17 g p.o. twice daily, as well as given of bisacodyl suppository  Leukocytosis, improving -In the setting of the steroid demargination -Patient's WBC worsened to 25,000 now 17,000 -Work-up so far is negative for infection -Blood cultures no growth negative cultures at 2 days -Chest x-ray done and did not show any evidence of any infection -Urinalysis was clear -Continue monitor and wean steroids -Repeat CBC in a.m.  Thrombocytosis -Likely reactive -Patient's platelet count went from 4 71-4 38 and now 500 -Continue monitor and repeat CBC in a.m.  DVT prophylaxis: Anticoagulated with Xarelto Code Status: DO NOT RESUSCITATE Family Communication: Discussed with Family at bedside  Disposition Plan: Main inpatient for further work-up and treatment and stabilization by IR and will brush biopsy  Consultants:   Gastroenterology  Interventional Radiology    Procedures:  PTC and placement of internal/external biliary drain  Findings: Severe intrahepatic biliary dilatation.  Central biliary obstruction.  10 Fr internal / external drain placed from right lobe.  Unable to get good access from left hepatic ducts.     Antimicrobials:  Anti-infectives (From admission, onward)   Start     Dose/Rate Route Frequency Ordered Stop   02/02/18 0900  piperacillin-tazobactam  (ZOSYN) IVPB 3.375 g  Status:  Discontinued     3.375 g 100 mL/hr over 30 Minutes Intravenous  Once 02/02/18 0838 02/05/18 1327   02/02/18 0839  piperacillin-tazobactam (ZOSYN) IVPB     over 30 Minutes  Continuous PRN 02/02/18 0839 02/02/18 0839     Subjective: Seen and examined at bedside this AM she had the brush biopsy and drain replacement and she states that she is feeling good but her main complaint as well as her right shoulder pain.  No chest pain, lightheadedness or dizziness.  No nausea or vomiting.  States abdominal pain has improved.  Has no dyspnea today.  No other concerns or complaints at the time but still feels weak.  Objective: Vitals:   02/09/18 1109 02/09/18 1647 02/09/18 2016 02/09/18 2030  BP: 135/61 134/66  124/64  Pulse: 66 68  71  Resp: _0 Temp: 98.1 F (36.7 C) 98.6 F (37 C)  97.9 F (36.6 C)  TempSrc: Oral Oral  Oral  SpO2: 100% 100% 100% 98%  Weight:    112.8 kg  Height:        Intake/Output Summary (Last 24 hours) at 02/09/2018 2134 Last data filed at 02/09/2018 2007 Gross per 24 hour  Intake 668.46 ml  Output 2350 ml  Net -1681.54 ml   Filed Weights   02/07/18 0120 02/08/18 2137 02/09/18 2030  Weight: 112.9 kg 113 kg 112.8 kg   Examination: Physical Exam:  Constitutional: Well-nourished, well-developed obese Caucasian female currently no acute distress and appears calm and comfortable after her brush biopsy and drain replacement Eyes: Conjunctive are normal.  Sclera anicteric ENMT: External ears and nose appear normal.  Grossly normal hearing Neck: Appears supple no JVD Respiratory: Slightly diminished auscultation bilaterally no appreciable wheezing, rales, rhonchi.  Has coarse breath sounds but is unlabored breathing and is not wheezing today.  Wearing oxygen via nasal cannula Cardiovascular: Regular rate and rhythm.  No appreciable murmurs, rubs or gallops. Abdomen: Soft, not tender to palpate today.  Distended secondary body  habitus.  Biliary drain with improved drainage and coloration GU: Deferred Musculoskeletal: No contractures or cyanosis.  Wearing a splint on her right index finger Skin: Skin is still jaundiced but has improved significantly since coming in.  No appreciable  rashes or lesions on physical evaluation Neurologic: Cranial nerves II through XII grossly intact with no appreciable focal deficits Psychiatric: She is awake alert and oriented x3.  Not confused today  Data Reviewed: I have personally reviewed following labs and imaging studies  CBC: Recent Labs  Lab 02/04/18 0117 02/05/18 0751 02/06/18 0641 02/07/18 0359 02/08/18 0531 02/09/18 1136  WBC 11.8* 11.4* 12.5* 11.1* 25.0* 17.0*  NEUTROABS 8.8* 8.1*  --  7.8* 24.8* 15.2*  HGB 10.1* 10.2* 10.3* 10.4* 10.2* 10.5*  HCT 31.2* 32.5* 32.8* 33.6* 34.3* 34.0*  MCV 89.4 91.5 91.4 92.8 94.2 93.2  PLT 386 412* 417* 471* 438* 403*   Basic Metabolic Panel: Recent Labs  Lab 02/05/18 0751 02/06/18 0839 02/07/18 0359 02/08/18 0531 02/09/18 1136  NA 137 136 136 134* 138  K 4.6 4.4 4.7 4.6 4.1  CL 106 101 100 100 101  CO2 _0 20* 27  GLUCOSE 107* 142* 92 154* 111*  BUN _1 32* 40*  CREATININE 0.88 0.94 0.90 1.23* 1.04*  CALCIUM 8.7* 8.8* 8.8* 8.5* 8.8*  MG 2.0 1.8 2.0 2.1 2.4  PHOS 3.9 4.3 4.3 5.1* 3.8   GFR: Estimated Creatinine Clearance: 53.1 mL/min (A) (by C-G formula based on SCr of 1.04 mg/dL (H)). Liver Function Tests: Recent Labs  Lab 02/05/18 0751 02/06/18 0839 02/07/18 0359 02/08/18 0531 02/09/18 1136  AST 52* 92* 135* 145* 72*  ALT 110* 133* 175* 245* 179*  ALKPHOS 641* 552* 498* 496* 424*  BILITOT 4.1* 3.2* 3.8* 3.2* 2.7*  PROT 6.0* 6.3* 6.0* 6.4* 6.6  ALBUMIN 2.1* 2.1* 2.1* 2.3* 2.3*   No results for input(s): LIPASE, AMYLASE in the last 168 hours. No results for input(s): AMMONIA in the last 168 hours. Coagulation Profile: Recent Labs  Lab 02/09/18 0512  INR 1.10   Cardiac Enzymes: No results  for input(s): CKTOTAL, CKMB, CKMBINDEX, TROPONINI in the last 168 hours. BNP (last 3 results) No results for input(s): PROBNP in the last 8760 hours. HbA1C: No results for input(s): HGBA1C in the last 72 hours. CBG: No results for input(s): GLUCAP in the last 168 hours. Lipid Profile: No results for input(s): CHOL, HDL, LDLCALC, TRIG, CHOLHDL, LDLDIRECT in the last 72 hours. Thyroid Function Tests: No results for input(s): TSH, T4TOTAL, FREET4, T3FREE, THYROIDAB in the last 72 hours. Anemia Panel: No results for input(s): VITAMINB12, FOLATE, FERRITIN, TIBC, IRON, RETICCTPCT in the last 72 hours. Sepsis Labs: No results for input(s): PROCALCITON, LATICACIDVEN in the last 168 hours.  Recent Results (from the past 240 hour(s))  Urine culture     Status: Abnormal   Collection Time: 02/01/18  9:46 AM  Result Value Ref Range Status   Specimen Description URINE, RANDOM  Final   Special Requests   Final    NONE Performed at [facility], [address], [place], [place] [address]    Culture 50,000 COLONIES/mL ESCHERICHIA COLI (A)  Final   Report Status 02/03/2018 FINAL  Final   Organism ID, Bacteria ESCHERICHIA COLI (A)  Final      Susceptibility   Escherichia coli - MIC*    AMPICILLIN >=32 RESISTANT Resistant     CEFAZOLIN <=4 SENSITIVE Sensitive     CEFTRIAXONE <=1 SENSITIVE Sensitive     CIPROFLOXACIN <=0.25 SENSITIVE Sensitive     GENTAMICIN <=1 SENSITIVE Sensitive     IMIPENEM <=0.25 SENSITIVE Sensitive     NITROFURANTOIN <=16 SENSITIVE Sensitive     TRIMETH/SULFA <=20 SENSITIVE Sensitive  AMPICILLIN/SULBACTAM >=32 RESISTANT Resistant     PIP/TAZO <=4 SENSITIVE Sensitive     Extended ESBL NEGATIVE Sensitive     * 50,000 COLONIES/mL ESCHERICHIA COLI  Gram stain     Status: None   Collection Time: 02/02/18  1:00 PM  Result Value Ref Range Status   Specimen Description FLUID  Final   Special Requests   Final    NONE Performed at [facility], [place] [address] [place], [place] [address]    Gram Stain   Final    CYTOSPIN SMEAR WBC PRESENT, PREDOMINANTLY PMN NO ORGANISMS SEEN    Report Status 02/02/2018 FINAL  Final  Culture, body fluid-bottle     Status: None   Collection Time: 02/02/18  1:00 PM  Result Value Ref Range Status   Specimen Description FLUID  Final   Special Requests   Final    NONE Performed at [facility], [place] [address] [place], [place] [address]    Culture NO GROWTH 5 DAYS  Final   Report Status 02/07/2018 FINAL  Final  Culture, Urine     Status: None   Collection Time: 02/06/18 10:12 AM  Result Value Ref Range Status   Specimen Description URINE, RANDOM  Final   Special Requests NONE  Final   Culture   Final    NO GROWTH Performed at [facility], [place] [address] [place], [place] [address]    Report Status 02/07/2018 FINAL  Final  Culture, blood (routine x 2)     Status: None (Preliminary result)   Collection Time: 02/07/18  8:12 PM  Result Value Ref Range Status   Specimen Description BLOOD RIGHT HAND  Final   Special Requests   Final    BOTTLES DRAWN AEROBIC AND ANAEROBIC Blood Culture adequate volume   Culture   Final    NO GROWTH 2 DAYS Performed at [facility], [place] [address], [place], [place] [address]    Report Status PENDING  Incomplete  Culture, blood (routine x 2)     Status: None (Preliminary result)   Collection Time: 02/07/18  9:34 PM  Result Value Ref Range Status   Specimen Description BLOOD LEFT HAND  Final   Special Requests   Final    BOTTLES DRAWN AEROBIC ONLY Blood Culture results may not be optimal due to an inadequate volume of blood received in culture bottles   Culture   Final    NO GROWTH 2 DAYS Performed at [facility], [place] [address] [place], [place] [address]    Report Status PENDING  Incomplete    Radiology Studies: Dg Chest Port 1 View  Result Date: 02/08/2018 CLINICAL DATA:  85 year old female with shortness breath for the past week. Subsequent  encounter. EXAM: PORTABLE CHEST 1 VIEW COMPARISON:  02/07/2018 chest CT and chest x-ray. FINDINGS: Mild cardiomegaly. Central pulmonary vascular prominence. No segmental consolidation or pneumothorax. CT detected pulmonary nodules not appreciated on present plain film exam. Calcified aorta. Calcified left carotid bifurcation. Bilateral acromioclavicular joint degenerative changes. Prior surgery lower cervical spine. IMPRESSION: 1. Mild cardiomegaly. 2. Mild central pulmonary vascular prominence. 3. Blunting left costophrenic angle similar to prior exams and may be related to prominent epicardial fat pad/subsegmental atelectasis as seen on CT. 4. CT detected pulmonary nodules not appreciated on present plain film exam. Electronically Signed   By: Genia Del M.D.   On: 02/08/2018 07:05   Ir Exchange Biliary Drain  Result Date: 02/09/2018 INDICATION:  85 year old female with a history of central obstructing mass in the liver with biliary ductal dilatation and jaundice, initially treated with right-sided PTC and drainage 02/02/2018. She presents today for repositioning of the drain which has been partially withdrawn and a brush biopsy EXAM: ULTRASOUND-GUIDED THROUGH THE TUBE CHOLANGIOGRAM AND EXCHANGE OF WITHDRAWN BILIARY TUBE PLACEMENT OF NEW 12 FRENCH DRAIN ENDOLUMINAL BRUSH BIOPSY MEDICATIONS: None ANESTHESIA/SEDATION: Moderate (conscious) sedation was employed during this procedure. A total of Versed 2.5 mg and Fentanyl 125 mcg was administered intravenously. Moderate Sedation Time: 25 minutes. The patient's level of consciousness and vital signs were monitored continuously by radiology nursing throughout the procedure under my direct supervision. FLUOROSCOPY TIME:  Fluoroscopy Time: 6 minutes 18 seconds (192 mGy). COMPLICATIONS: None PROCEDURE: Informed written consent was obtained from the patient after a thorough discussion of the procedural risks, benefits and alternatives. All questions were addressed.  Maximal Sterile Barrier Technique was utilized including caps, mask, sterile gowns, sterile gloves, sterile drape, hand hygiene and skin antiseptic. A timeout was performed prior to the initiation of the procedure. Patient positioned supine position on the fluoroscopy table. Patient is prepped and draped in the usual sterile fashion. Scout images were acquired of the upper abdomen. Through the tube cholangiogram was performed demonstrating withdrawal of the previously existing 10 [place] biliary drain. A Glidewire was navigated into the duodenum through the existing tract. Drain was removed from the [employer]. A short Kumpe the catheter was then navigated into the duodenum. Rose in wire was placed through the Kumpe the catheter and the Kumpe the catheter was withdrawn. A 7 French 35 cm bright tip sheath was then placed over the Rose an wire into the biliary system. Introducer was withdrawn and a pull-back cholangiogram was performed. The targeted narrowing was just cephalad of the clips in the liver hilum, at a region of ductal narrowing in the presumed area of tumor. Introducer was placed and the sheath was then again pushed beyond the occlusion. Coaxial cytology brush biopsy kit was advanced through the sheath, parallel to the safety wire. The sheath was then withdrawn and the brush biopsy kit was withdrawn into the region of narrowing with agitation across the lesion. Brush was then withdrawn placed into saline comp for cytology. This biopsy was then repeated for a total of 2 brush biopsies. The sheath was then withdrawn and we attempted to place a 12 [place] biliary drain over the Rose an wire. The tract required 12 French dilation. The 12 French drain was again attempted to place over the [facility] an wire, unsuccessful given the angle of approach through the abdominal wall. Kumpe the catheter was replaced to the duodenum and the Rose an wire was exchanged for stiff Amplatz 160 cm wire. We were then successful with  placing the 12 [place] biliary drain over the wire into the duodenum, with the proximal side holes positioned just proximal to the presumed site of occlusion/tumor. Drain was sutured in position.  Final images were stored. Gravity drain was attached. Patient tolerated the procedure well and remained hemodynamically stable throughout. No complications were encountered and no significant blood loss. IMPRESSION: Status post routine exchange and up sizing of PTC/drainage with a new 12 French drain placed. 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 Signed, Dulcy Fanny. Dellia Nims, RPVI [facility] [facility] Radiology Electronically Signed   By: Corrie Mckusick D.O.   On: 02/09/2018 10:41   Ir Endoluminal Bx Of Biliary Tree  Result Date:  02/09/2018 INDICATION: 85 year old female with a history of central obstructing mass in the liver with biliary ductal dilatation and jaundice, initially treated with right-sided PTC and drainage 02/02/2018. She presents today for repositioning of the drain which has been partially withdrawn and a brush biopsy EXAM: ULTRASOUND-GUIDED THROUGH THE TUBE CHOLANGIOGRAM AND EXCHANGE OF WITHDRAWN BILIARY TUBE PLACEMENT OF NEW 12 FRENCH DRAIN ENDOLUMINAL BRUSH BIOPSY MEDICATIONS: None ANESTHESIA/SEDATION: Moderate (conscious) sedation was employed during this procedure. A total of Versed 2.5 mg and Fentanyl 125 mcg was administered intravenously. Moderate Sedation Time: 25 minutes. The patient's level of consciousness and vital signs were monitored continuously by radiology nursing throughout the procedure under my direct supervision. FLUOROSCOPY TIME:  Fluoroscopy Time: 6 minutes 18 seconds (192 mGy). COMPLICATIONS: None PROCEDURE: Informed written consent was obtained from the patient after a thorough discussion of the procedural risks, benefits and alternatives. All questions were addressed. Maximal Sterile  Barrier Technique was utilized including caps, mask, sterile gowns, sterile gloves, sterile drape, hand hygiene and skin antiseptic. A timeout was performed prior to the initiation of the procedure. Patient positioned supine position on the fluoroscopy table. Patient is prepped and draped in the usual sterile fashion. Scout images were acquired of the upper abdomen. Through the tube cholangiogram was performed demonstrating withdrawal of the previously existing 10 [place] biliary drain. A Glidewire was navigated into the duodenum through the existing tract. Drain was removed from the [employer]. A short Kumpe the catheter was then navigated into the duodenum. Rose in wire was placed through the Kumpe the catheter and the Kumpe the catheter was withdrawn. A 7 French 35 cm bright tip sheath was then placed over the Rose an wire into the biliary system. Introducer was withdrawn and a pull-back cholangiogram was performed. The targeted narrowing was just cephalad of the clips in the liver hilum, at a region of ductal narrowing in the presumed area of tumor. Introducer was placed and the sheath was then again pushed beyond the occlusion. Coaxial cytology brush biopsy kit was advanced through the sheath, parallel to the safety wire. The sheath was then withdrawn and the brush biopsy kit was withdrawn into the region of narrowing with agitation across the lesion. Brush was then withdrawn placed into saline comp for cytology. This biopsy was then repeated for a total of 2 brush biopsies. The sheath was then withdrawn and we attempted to place a 12 [place] biliary drain over the Rose an wire. The tract required 12 French dilation. The 12 French drain was again attempted to place over the [facility] an wire, unsuccessful given the angle of approach through the abdominal wall. Kumpe the catheter was replaced to the duodenum and the Rose an wire was exchanged for stiff Amplatz 160 cm wire. We were then successful with placing the 12  [place] biliary drain over the wire into the duodenum, with the proximal side holes positioned just proximal to the presumed site of occlusion/tumor. Drain was sutured in position.  Final images were stored. Gravity drain was attached. Patient tolerated the procedure well and remained hemodynamically stable throughout. No complications were encountered and no significant blood loss. IMPRESSION: Status post routine exchange and up sizing of PTC/drainage with a new 12 French drain placed. 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 Signed, Dulcy Fanny. Dellia Nims, RPVI [facility] Radiology Electronically Signed   By: Corrie Mckusick D.O.   On: 02/09/2018 10:41   Scheduled Meds: . amLODipine  10 mg  Oral Daily  . feeding supplement (ENSURE ENLIVE)  237 mL Oral BID BM  . [START ON 02/10/2018] methylPREDNISolone (SOLU-MEDROL) injection  40 mg Intravenous Daily  . mometasone-formoterol  2 puff Inhalation BID  . nebivolol  20 mg Oral Daily  . polyethylene glycol  17 g Oral BID  . senna-docusate  1 tablet Oral BID  . sodium chloride flush  5 mL Intracatheter Q8H  . traMADol  50 mg Oral BID   Continuous Infusions: . heparin 1,200 Units/hr (02/09/18 1515)    LOS: 8 days   Kerney Elbe, DO [facility] PAGER is on AMION  If 7PM-7AM, please contact night-coverage [email or web address] Password [place] 02/09/2018, 9:34 PM

## 2018-02-09 NOTE — Progress Notes (Signed)
Occupational Therapy Treatment Patient Details Name: Jordan Byrd MRN: [id number] DOB: 1933/02/27 Today's Date: 02/09/2018    History of present illness 85 y.o. female with medical history significant of HTN; CHF; remote breast cancer; and afib on Xarelto presenting with a fall. Dx of jaundice, new liver mass noted on imaging, s/p percutaneous biliary drain 02/02/18. S/p exchange of biliary drain and brush biopsy on 02/09/2018.   OT comments  Pt progressing well towards established OT goals and demonstrating increased activity tolerance. Pt motivated to participate in therapy. Pt performing bed mobility with Mod A +2. Pt requiring Min-Mod A +2 for functional transfers with RW. Continue to recommend dc to post-acute rehab to optimize safety and independence with ADLs before transitioning home.    Follow Up Recommendations  SNF;Supervision/Assistance - 24 hour    Equipment Recommendations  Other (comment)(Defer to next venue)    Recommendations for Other Services PT consult    Precautions / Restrictions Precautions Precautions: Fall Precaution Comments: 8 falls since November Restrictions Weight Bearing Restrictions: No       Mobility Bed Mobility Overal bed mobility: Needs Assistance Bed Mobility: Supine to Sit     Supine to sit: Mod assist;+2 for safety/equipment;+2 for physical assistance;HOB elevated     General bed mobility comments: Mod A +2 to elevate trunk. Requiring assistance for bring right hip towards EOB with bedpad.  Transfers                      Balance Overall balance assessment: Needs assistance Sitting-balance support: Feet supported;Bilateral upper extremity supported Sitting balance-Leahy Scale: Fair Sitting balance - Comments: sitting tolerance limited by back pain and nausea   Standing balance support: Bilateral upper extremity supported;During functional activity Standing balance-Leahy Scale: Poor Standing balance comment: Reliant on UE  support                           ADL either performed or assessed with clinical judgement   ADL Overall ADL's : Needs assistance/impaired                         Toilet Transfer: Minimal assistance;+2 for physical assistance;Moderate assistance;RW;Stand-pivot Toilet Transfer Details (indicate cue type and reason): Pt requiring Mod A +2 to power up into standing from EOB and then Min A +2 for pivot to recliner for simulated toilet transfer.          Functional mobility during ADLs: Minimal assistance;Moderate assistance;Rolling walker(stand pivot only) General ADL Comments: Pt presenting with increased activity tolerance. However, contineus to present with decreased balance, strength, and tolerance. SpO2 dropping to 76% on 2L O2. Increasing O2 to 3L and provided VCs for purse lip breathing     Vision       Perception     Praxis      Cognition Arousal/Alertness: Awake/alert Behavior During Therapy: WFL for tasks assessed/performed Overall Cognitive Status: Within Functional Limits for tasks assessed                                 General Comments: Pt more awake today and feeling better. Feel pt is at baseline cognition        Exercises General Exercises - Lower Extremity Ankle Circles/Pumps: AROM;Both;10 reps;Supine   Shoulder Instructions       General Comments Daughter present throughout session    Pertinent Vitals/ Pain  Pain Assessment: Faces Faces Pain Scale: Hurts little more Pain Location: Right shoulder and abdomen Pain Descriptors / Indicators: Sore Pain Intervention(s): Monitored during session;Repositioned  Home Living                                          Prior Functioning/Environment              Frequency  Min 2X/week        Progress Toward Goals  OT Goals(current goals can now be found in the care plan section)  Progress towards OT goals: Progressing toward goals  Acute  Rehab OT Goals Patient Stated Goal: return to prior functional level OT Goal Formulation: With patient/family Time For Goal Achievement: 02/20/18 Potential to Achieve Goals: Good ADL Goals Pt Will Perform Grooming: with min guard assist;sitting Pt Will Perform Upper Body Dressing: with min assist;sitting Pt Will Transfer to Toilet: with mod assist;with +2 assist;stand pivot transfer;bedside commode Pt Will Perform Toileting - Clothing Manipulation and hygiene: with mod assist;sit to/from stand;sitting/lateral leans Additional ADL Goal #1: Pt will perform bed mobility with Min A in preparation for ADLs.  Plan Discharge plan remains appropriate    Co-evaluation    PT/OT/SLP Co-Evaluation/Treatment: Yes Reason for Co-Treatment: For patient/therapist safety;To address functional/ADL transfers   OT goals addressed during session: ADL's and self-care      AM-PAC OT "6 Clicks" Daily Activity     Outcome Measure   Help from another person eating meals?: A Little Help from another person taking care of personal grooming?: A Little Help from another person toileting, which includes using toliet, bedpan, or urinal?: A Lot Help from another person bathing (including washing, rinsing, drying)?: A Lot Help from another person to put on and taking off regular upper body clothing?: A Lot Help from another person to put on and taking off regular lower body clothing?: A Lot 6 Click Score: 14    End of Session Equipment Utilized During Treatment: Gait belt;Rolling walker;Oxygen(2-3L)  OT Visit Diagnosis: Unsteadiness on feet (R26.81);Other abnormalities of gait and mobility (R26.89);Muscle weakness (generalized) (M62.81);Other symptoms and signs involving cognitive function;Pain;History of falling (Z91.81);Repeated falls (R29.6) Pain - Right/Left: Left Pain - part of body: Leg(Back)   Activity Tolerance Patient tolerated treatment well   Patient Left with call bell/phone within reach;with  family/visitor present;in chair   Nurse Communication Mobility status;Other (comment)(SpO2)        Time: 7591-6384 OT Time Calculation (min): 23 min  Charges: OT General Charges $OT Visit: 1 Visit OT Treatments $Self Care/Home Management : 8-22 mins  [place], OTR/L Acute Rehab Pager: [phone] Office: [place] 02/09/2018, 12:50 PM

## 2018-02-09 NOTE — Sedation Documentation (Signed)
Pain 7/10 right shoulder

## 2018-02-09 NOTE — Sedation Documentation (Signed)
Pain right shoulder

## 2018-02-09 NOTE — Sedation Documentation (Addendum)
Patient is resting but having discomfort right shoulder.

## 2018-02-09 NOTE — Progress Notes (Signed)
Physical Therapy Treatment Patient Details Name: Jordan Byrd MRN: [id number] DOB: 06/03/1933 Today's Date: 02/09/2018    History of Present Illness 85 y.o. female with medical history significant of HTN; CHF; remote breast cancer; and afib on Xarelto presenting with a fall. Dx of jaundice, new liver mass noted on imaging, s/p percutaneous biliary drain 02/02/18. S/p exchange of biliary drain and brush biopsy on 02/09/2018.    PT Comments    Pt progressing towards physical therapy goals. Was able to tolerate OOB to chair this session however O2 sats limiting progression to gait training. After SPT to chair, sats at 78%. Supplemental O2 increased from 2L/min to 3L/min and left on 3L per RN. Will continue to follow and progress as able per POC.    Follow Up Recommendations  SNF;Supervision/Assistance - 24 hour;Supervision for mobility/OOB     Equipment Recommendations  None recommended by PT    Recommendations for Other Services       Precautions / Restrictions Precautions Precautions: Fall Precaution Comments: Drain, watch O2 sats, 8 falls since November Restrictions Weight Bearing Restrictions: No    Mobility  Bed Mobility Overal bed mobility: Needs Assistance Bed Mobility: Supine to Sit     Supine to sit: Mod assist;+2 for safety/equipment;+2 for physical assistance;HOB elevated     General bed mobility comments: Mod A +2 to elevate trunk. Requiring assistance for bring right hip towards EOB with bedpad.  Transfers Overall transfer level: Needs assistance Equipment used: Rolling walker (2 wheeled) Transfers: Sit to/from [facility] Sit to Stand: Min assist;+2 safety/equipment;From elevated surface Stand pivot transfers: Min assist;+2 safety/equipment;From elevated surface       General transfer comment: VC's for hand placement on seated surface for safety. Pt was able to power-up to full stand with +2 assist initially for physical assist and then for  safety only. Pt took a few pivotal steps around to the chair with assist for balance support and safety. Therapist guided hips around and provided frequent cues for backing up to the chair before initiating stand>sit.   Ambulation/Gait             General Gait Details: O2 status did not allow for progression of ambulation   Stairs             Wheelchair Mobility    Modified Rankin (Stroke Patients Only)       Balance Overall balance assessment: Needs assistance Sitting-balance support: Feet supported;Bilateral upper extremity supported Sitting balance-Leahy Scale: Fair Sitting balance - Comments: sitting tolerance limited by back pain and nausea   Standing balance support: Bilateral upper extremity supported;During functional activity Standing balance-Leahy Scale: Poor Standing balance comment: Reliant on UE support                            Cognition Arousal/Alertness: Awake/alert Behavior During Therapy: WFL for tasks assessed/performed Overall Cognitive Status: Within Functional Limits for tasks assessed                                 General Comments: Pt more awake today and feeling better. Feel pt is at baseline cognition      Exercises General Exercises - Lower Extremity Ankle Circles/Pumps: AROM;Both;10 reps;Supine    General Comments General comments (skin integrity, edema, etc.): Daughter present throughout session      Pertinent Vitals/Pain Pain Assessment: Faces Faces Pain Scale: Hurts little more Pain  Location: Right shoulder and abdomen Pain Descriptors / Indicators: Sore Pain Intervention(s): Monitored during session    Home Living                      Prior Function            PT Goals (current goals can now be found in the care plan section) Acute Rehab PT Goals Patient Stated Goal: return to prior functional level PT Goal Formulation: With patient Time For Goal Achievement: 02/18/18 Potential  to Achieve Goals: Fair Progress towards PT goals: Progressing toward goals    Frequency    Min 2X/week      PT Plan Current plan remains appropriate    Co-evaluation PT/OT/SLP Co-Evaluation/Treatment: Yes Reason for Co-Treatment: For patient/therapist safety;To address functional/ADL transfers PT goals addressed during session: Mobility/safety with mobility;Balance;Proper use of DME OT goals addressed during session: ADL's and self-care      AM-PAC PT "6 Clicks" Mobility   Outcome Measure  Help needed turning from your back to your side while in a flat bed without using bedrails?: A Little Help needed moving from lying on your back to sitting on the side of a flat bed without using bedrails?: A Lot Help needed moving to and from a bed to a chair (including a wheelchair)?: A Little Help needed standing up from a chair using your arms (e.g., wheelchair or bedside chair)?: A Lot Help needed to walk in hospital room?: A Lot Help needed climbing 3-5 steps with a railing? : Total 6 Click Score: 13    End of Session Equipment Utilized During Treatment: Gait belt Activity Tolerance: Patient limited by pain Patient left: in bed;with bed alarm set;with call bell/phone within reach Nurse Communication: Mobility status;Need for lift equipment PT Visit Diagnosis: Muscle weakness (generalized) (M62.81);Repeated falls (R29.6);Adult, failure to thrive (R62.7);History of falling (Z91.81);Other abnormalities of gait and mobility (R26.89)     Time: 0045-9977 PT Time Calculation (min) (ACUTE ONLY): 23 min  Charges:  $Gait Training: 8-22 mins                     Jordan Byrd, PT, DPT Acute Rehabilitation Services Pager: [phone] Office: [phone]    Thelma Comp 02/09/2018, 1:17 PM

## 2018-02-09 NOTE — Sedation Documentation (Signed)
Pain right shoulder .

## 2018-02-09 NOTE — Sedation Documentation (Signed)
Patient is resting comfortably. 

## 2018-02-09 NOTE — Sedation Documentation (Addendum)
Patient is resting comfortably. Pain right shoulder decreased to 4/10. Daughter in [place] with patient and Dr Earleen Newport came to speak to her.

## 2018-02-10 DIAGNOSIS — I5032 Chronic diastolic (congestive) heart failure: Secondary | ICD-10-CM

## 2018-02-10 DIAGNOSIS — I480 Paroxysmal atrial fibrillation: Secondary | ICD-10-CM

## 2018-02-10 DIAGNOSIS — R296 Repeated falls: Secondary | ICD-10-CM

## 2018-02-10 DIAGNOSIS — I10 Essential (primary) hypertension: Secondary | ICD-10-CM

## 2018-02-10 DIAGNOSIS — N183 Chronic kidney disease, stage 3 (moderate): Secondary | ICD-10-CM

## 2018-02-10 LAB — CBC WITH DIFFERENTIAL/PLATELET
Abs Immature Granulocytes: 0.06 10*3/uL (ref 0.00–0.07)
BASOS PCT: 0 %
Basophils Absolute: 0 10*3/uL (ref 0.0–0.1)
EOS ABS: 0.1 10*3/uL (ref 0.0–0.5)
Eosinophils Relative: 1 %
HCT: 34.3 % — ABNORMAL LOW (ref 36.0–46.0)
Hemoglobin: 10.3 g/dL — ABNORMAL LOW (ref 12.0–15.0)
Immature Granulocytes: 1 %
LYMPHS ABS: 1 10*3/uL (ref 0.7–4.0)
Lymphocytes Relative: 8 %
MCH: 28.5 pg (ref 26.0–34.0)
MCHC: 30 g/dL (ref 30.0–36.0)
MCV: 94.8 fL (ref 80.0–100.0)
MONOS PCT: 7 %
Monocytes Absolute: 0.8 10*3/uL (ref 0.1–1.0)
Neutro Abs: 10.1 10*3/uL — ABNORMAL HIGH (ref 1.7–7.7)
Neutrophils Relative %: 83 %
PLATELETS: 447 10*3/uL — AB (ref 150–400)
RBC: 3.62 MIL/uL — ABNORMAL LOW (ref 3.87–5.11)
RDW: 18.2 % — AB (ref 11.5–15.5)
WBC: 12.1 10*3/uL — ABNORMAL HIGH (ref 4.0–10.5)
nRBC: 0.2 % (ref 0.0–0.2)

## 2018-02-10 LAB — COMPREHENSIVE METABOLIC PANEL
ALT: 171 U/L — ABNORMAL HIGH (ref 0–44)
AST: 78 U/L — ABNORMAL HIGH (ref 15–41)
Albumin: 2.2 g/dL — ABNORMAL LOW (ref 3.5–5.0)
Alkaline Phosphatase: 358 U/L — ABNORMAL HIGH (ref 38–126)
Anion gap: 10 (ref 5–15)
BUN: 37 mg/dL — ABNORMAL HIGH (ref 8–23)
CO2: 27 mmol/L (ref 22–32)
Calcium: 8.8 mg/dL — ABNORMAL LOW (ref 8.9–10.3)
Chloride: 101 mmol/L (ref 98–111)
Creatinine, Ser: 0.87 mg/dL (ref 0.44–1.00)
GFR calc Af Amer: >60 mL/min (ref >60)
GFR calc non Af Amer: >60 mL/min (ref >60)
GLUCOSE: 82 mg/dL (ref 70–99)
Potassium: 4.3 mmol/L (ref 3.5–5.1)
Sodium: 138 mmol/L (ref 135–145)
Total Bilirubin: 2.5 mg/dL — ABNORMAL HIGH (ref 0.3–1.2)
Total Protein: 6.1 g/dL — ABNORMAL LOW (ref 6.5–8.1)

## 2018-02-10 LAB — PHOSPHORUS: Phosphorus: 4.9 mg/dL — ABNORMAL HIGH (ref 2.5–4.6)

## 2018-02-10 LAB — HEPARIN LEVEL (UNFRACTIONATED)
Heparin Unfractionated: 0.32 IU/mL (ref 0.30–0.70)
Heparin Unfractionated: 0.31 IU/mL (ref 0.30–0.70)

## 2018-02-10 LAB — MAGNESIUM: Magnesium: 2.2 mg/dL (ref 1.7–2.4)

## 2018-02-10 MED ORDER — ADULT MULTIVITAMIN W/MINERALS CH
1.0000 | ORAL_TABLET | Freq: Every day | ORAL | Status: DC
  Administered 2018-02-10 – 2018-02-12 (×3): 1 via ORAL
  Filled 2018-02-10 (×3): qty 1

## 2018-02-10 MED ORDER — ENSURE ENLIVE PO LIQD
237.0000 mL | Freq: Three times a day (TID) | ORAL | Status: DC
  Administered 2018-02-10 – 2018-02-12 (×5): 237 mL via ORAL

## 2018-02-10 NOTE — Progress Notes (Signed)
Pt's grandson, Jordan Byrd would like to be notified when results of liver biopsy are in so he can be with her at beside for support.

## 2018-02-10 NOTE — Progress Notes (Signed)
PROGRESS NOTE   Jordan Byrd  PTW:[id number]    DOB: [birth year]    DOA: 02/01/2018  PCP: Levin Erp, MD   I have briefly reviewed patients previous medical records in [facility].  Brief Narrative:  85 year old female with PMH of HTN, CHF, remote breast cancer, A. fib on Xarelto presented to the ED after a fall at home/frequent falls.  She reportedly has been losing balance a lot since Thanksgiving and her knees have been giving out.  Family noticed yellowish discoloration.  In ED, finger dislocation, relocated and splint in place.  Jaundiced with bilirubin of 13.6.   Assessment & Plan:   Principal Problem:   Obstructive jaundice Active Problems:   Hypertension   CKD (chronic kidney disease) stage 3, GFR 30-59 ml/min (HCC)   Chronic diastolic heart failure (HCC)   Atrial fibrillation (HCC)   Falls frequently   Obstructive painless jaundice/liver mass Presented with clinical jaundice, abnormal LFTs and total bilirubin of 13.6. CT abdomen and pelvis 12/30: Ill-defined central hepatic mass in the left lower lobe of liver measuring approximately 5.5 x 3.3 x 3.6 cm with associated severe intrahepatic biliary ductal dilatation.  Findings highly concerning for cholangiocarcinoma. [facility] consulted on 12/30 and recommended IR evaluation for liver biopsy and possible stenting of intrahepatic bile duct.  Since CBD of normal caliber, ERCP not indicated.   On 12/31, IR placed percutaneous 10 French internal/external drain in right liver lobe but unable to get good access from left hepatic ducts.  Fluid sent for culture and cytology. AFP was 6.5, CA-19-9 was 7571, and CA 125 was 67.6 and CEA was 4.0 S/p PTC with I/E biliary drain, bilirubin started to trend down. On 1/5, patient reported severe abdominal pain with associated worsening of LFTs including bilirubin.  Drain appeared to be malfunctioning. On 1/7, IR performed exchange of retracted right PTC/drain of biliary tree and new 12  [place] biliary drain placed.  Brush biopsy of the tight lesion of biliary tree performed. Xarelto held and patient on IV heparin bridge for procedures.  IR to advise timing of resumption of Xarelto. Cytopathology: Bile duct brushing from 02/09/2018: No malignant cells identified. Cytopathology: Bile duct brushing fluid 02/02/2018: No malignant cells identified. Consider oncology consultation.  Frequent falls Suspected due to profound weakness and deconditioning relating to underlying malignancy. Follow-up PT and OT evaluation recommend SNF.  Paroxysmal A. Fib Currently in sinus rhythm.  Rate controlled on nebivolol 20 mg daily.  Xarelto has been intermittently held for procedures, currently on IV heparin infusion. Await IR input regarding timing of resumption of Xarelto.  Acute on chronic diastolic CHF Noted volume overload on 1/5.  Received a dose of IV Lasix.  -8.3 L since admission. TTE 02/08/2018: LVEF 55-60%.  Rate 1 diastolic dysfunction. No clinical bronchospasm, discontinued IV Solu-Medrol.  Acute on stage III chronic kidney disease In the setting of GI losses, losartan.  Losartan discontinued.  Creatinine has normalized.  Periodically monitor BMP.  Essential hypertension Controlled on amlodipine 10 mg daily and nebivolol 20 mg daily.  Losartan discontinued due to acute kidney injury.  Nausea and vomiting In the context of liver mass.  Improved.  Morbid obesity/Body mass index is 40.14 kg/m.  Right shoulder pain Suspected biliary origin and drain placement.  Resolved.  Normocytic anemia/iron deficiency anemia Stable.  Consider iron supplementation.  Asymptomatic bacteriuria No antibiotics recommended.  Suspected pulmonary nodules Noted on CTA chest which showed no PE.  Concern for pulmonary metastasis.  Needs follow-up.  Constipation Continue  bowel regimen.  Leukocytosis Suspect due to steroids.  Almost back to normal.  DVT prophylaxis: Currently on IV heparin  infusion. Code Status: DNR Family Communication: None at bedside Disposition: DC to SNF pending clinical improvement.   Consultants:  [facility] Interventional radiology  Procedures:  As noted above  Antimicrobials:  None at this time   Subjective: Patient reports no pain but has nausea, poor oral intake and complaining that she does not get her meals on time.  Tolerating Ensure.  ROS: As above, otherwise negative.  Objective:  Vitals:   02/09/18 2030 02/10/18 0356 02/10/18 0715 02/10/18 0913  BP: 124/64 (!) 141/49 (!) 146/59   Pulse: 71 66 70   Resp: '19 18 18   ' Temp: 97.9 F (36.6 C) 99.5 F (37.5 C) 98.8 F (37.1 C)   TempSrc: Oral Oral Oral   SpO2: 98% 100% 100% 97%  Weight: 112.8 kg     Height:        Examination:  General exam: Pleasant elderly female, moderately built and morbidly obese lying comfortably supine in bed. Respiratory system: Clear to auscultation. Respiratory effort normal. Cardiovascular system: S1 & S2 heard, RRR. No JVD, murmurs, rubs, gallops or clicks. No pedal edema.  Telemetry personally reviewed: Sinus rhythm. Gastrointestinal system: Abdomen is nondistended, soft and nontender. No organomegaly or masses felt. Normal bowel sounds heard.  Right biliary drain intact without acute findings. Central nervous system: Alert and oriented x2. No focal neurological deficits. Extremities: Symmetric 5 x 5 power. Skin: No rashes, lesions or ulcers Psychiatry: Judgement and insight appear normal. Mood & affect appropriate.     Data Reviewed: I have personally reviewed following labs and imaging studies  CBC: Recent Labs  Lab 02/05/18 0751 02/06/18 0641 02/07/18 0359 02/08/18 0531 02/09/18 1136 02/10/18 0642  WBC 11.4* 12.5* 11.1* 25.0* 17.0* 12.1*  NEUTROABS 8.1*  --  7.8* 24.8* 15.2* 10.1*  HGB 10.2* 10.3* 10.4* 10.2* 10.5* 10.3*  HCT 32.5* 32.8* 33.6* 34.3* 34.0* 34.3*  MCV 91.5 91.4 92.8 94.2 93.2 94.8  PLT 412* 417* 471* 438* 500*  494*   Basic Metabolic Panel: Recent Labs  Lab 02/06/18 0839 02/07/18 0359 02/08/18 0531 02/09/18 1136 02/10/18 0642  NA 136 136 134* 138 138  K 4.4 4.7 4.6 4.1 4.3  CL 101 100 100 101 101  CO2 25 27 20* 27 27  GLUCOSE 142* 92 154* 111* 82  BUN 16 22 32* 40* 37*  CREATININE 0.94 0.90 1.23* 1.04* 0.87  CALCIUM 8.8* 8.8* 8.5* 8.8* 8.8*  MG 1.8 2.0 2.1 2.4 2.2  PHOS 4.3 4.3 5.1* 3.8 4.9*   Liver Function Tests: Recent Labs  Lab 02/06/18 0839 02/07/18 0359 02/08/18 0531 02/09/18 1136 02/10/18 0642  AST 92* 135* 145* 72* 78*  ALT 133* 175* 245* 179* 171*  ALKPHOS 552* 498* 496* 424* 358*  BILITOT 3.2* 3.8* 3.2* 2.7* 2.5*  PROT 6.3* 6.0* 6.4* 6.6 6.1*  ALBUMIN 2.1* 2.1* 2.3* 2.3* 2.2*   Coagulation Profile: Recent Labs  Lab 02/09/18 0512  INR 1.10     Recent Results (from the past 240 hour(s))  Urine culture     Status: Abnormal   Collection Time: 02/01/18  9:46 AM  Result Value Ref Range Status   Specimen Description URINE, RANDOM  Final   Special Requests   Final    NONE Performed at [facility], [address], [place], [place] [address]    Culture 50,000 COLONIES/mL ESCHERICHIA COLI (A)  Final  Report Status 02/03/2018 FINAL  Final   Organism ID, Bacteria ESCHERICHIA COLI (A)  Final      Susceptibility   Escherichia coli - MIC*    AMPICILLIN >=32 RESISTANT Resistant     CEFAZOLIN <=4 SENSITIVE Sensitive     CEFTRIAXONE <=1 SENSITIVE Sensitive     CIPROFLOXACIN <=0.25 SENSITIVE Sensitive     GENTAMICIN <=1 SENSITIVE Sensitive     IMIPENEM <=0.25 SENSITIVE Sensitive     NITROFURANTOIN <=16 SENSITIVE Sensitive     TRIMETH/SULFA <=20 SENSITIVE Sensitive     AMPICILLIN/SULBACTAM >=32 RESISTANT Resistant     PIP/TAZO <=4 SENSITIVE Sensitive     Extended ESBL NEGATIVE Sensitive     * 50,000 COLONIES/mL ESCHERICHIA COLI  Gram stain     Status: None   Collection Time: 02/02/18  1:00 PM  Result Value Ref Range Status   Specimen Description FLUID   Final   Special Requests   Final    NONE Performed at [facility], [place] [address], [place], [place] [address]    Gram Stain   Final    CYTOSPIN SMEAR WBC PRESENT, PREDOMINANTLY PMN NO ORGANISMS SEEN    Report Status 02/02/2018 FINAL  Final  Culture, body fluid-bottle     Status: None   Collection Time: 02/02/18  1:00 PM  Result Value Ref Range Status   Specimen Description FLUID  Final   Special Requests   Final    NONE Performed at [facility], [place] [address], [place], [place] [address]    Culture NO GROWTH 5 DAYS  Final   Report Status 02/07/2018 FINAL  Final  Culture, Urine     Status: None   Collection Time: 02/06/18 10:12 AM  Result Value Ref Range Status   Specimen Description URINE, RANDOM  Final   Special Requests NONE  Final   Culture   Final    NO GROWTH Performed at [facility], [place] [address], [place], [place] [address]    Report Status 02/07/2018 FINAL  Final  Culture, blood (routine x 2)     Status: None (Preliminary result)   Collection Time: 02/07/18  8:12 PM  Result Value Ref Range Status   Specimen Description BLOOD RIGHT HAND  Final   Special Requests   Final    BOTTLES DRAWN AEROBIC AND ANAEROBIC Blood Culture adequate volume   Culture   Final    NO GROWTH 3 DAYS Performed at [facility], [place] [address] [place], [place] [address]    Report Status PENDING  Incomplete  Culture, blood (routine x 2)     Status: None (Preliminary result)   Collection Time: 02/07/18  9:34 PM  Result Value Ref Range Status   Specimen Description BLOOD LEFT HAND  Final   Special Requests   Final    BOTTLES DRAWN AEROBIC ONLY Blood Culture results may not be optimal due to an inadequate volume of blood received in culture bottles   Culture   Final    NO GROWTH 3 DAYS Performed at [facility], [place] [address] [place], [place] [address]    Report Status PENDING  Incomplete         Radiology Studies: Ir Exchange Biliary  Drain  Result Date: 02/09/2018 INDICATION: 84 year old female with a history of central obstructing mass in the liver with biliary ductal dilatation and jaundice, initially treated with right-sided PTC and drainage 02/02/2018. She presents today for repositioning of the drain which has  been partially withdrawn and a brush biopsy EXAM: ULTRASOUND-GUIDED THROUGH THE TUBE CHOLANGIOGRAM AND EXCHANGE OF WITHDRAWN BILIARY TUBE PLACEMENT OF NEW 12 FRENCH DRAIN ENDOLUMINAL BRUSH BIOPSY MEDICATIONS: None ANESTHESIA/SEDATION: Moderate (conscious) sedation was employed during this procedure. A total of Versed 2.5 mg and Fentanyl 125 mcg was administered intravenously. Moderate Sedation Time: 25 minutes. The patient's level of consciousness and vital signs were monitored continuously by radiology nursing throughout the procedure under my direct supervision. FLUOROSCOPY TIME:  Fluoroscopy Time: 6 minutes 18 seconds (192 mGy). COMPLICATIONS: None PROCEDURE: Informed written consent was obtained from the patient after a thorough discussion of the procedural risks, benefits and alternatives. All questions were addressed. Maximal Sterile Barrier Technique was utilized including caps, mask, sterile gowns, sterile gloves, sterile drape, hand hygiene and skin antiseptic. A timeout was performed prior to the initiation of the procedure. Patient positioned supine position on the fluoroscopy table. Patient is prepped and draped in the usual sterile fashion. Scout images were acquired of the upper abdomen. Through the tube cholangiogram was performed demonstrating withdrawal of the previously existing 10 [place] biliary drain. A Glidewire was navigated into the duodenum through the existing tract. Drain was removed from the [employer]. A short Kumpe the catheter was then navigated into the duodenum. Rose in wire was placed through the Kumpe the catheter and the Kumpe the catheter was withdrawn. A 7 French 35 cm bright tip sheath was then  placed over the Rose an wire into the biliary system. Introducer was withdrawn and a pull-back cholangiogram was performed. The targeted narrowing was just cephalad of the clips in the liver hilum, at a region of ductal narrowing in the presumed area of tumor. Introducer was placed and the sheath was then again pushed beyond the occlusion. Coaxial cytology brush biopsy kit was advanced through the sheath, parallel to the safety wire. The sheath was then withdrawn and the brush biopsy kit was withdrawn into the region of narrowing with agitation across the lesion. Brush was then withdrawn placed into saline comp for cytology. This biopsy was then repeated for a total of 2 brush biopsies. The sheath was then withdrawn and we attempted to place a 12 [place] biliary drain over the Rose an wire. The tract required 12 French dilation. The 12 French drain was again attempted to place over the [facility] an wire, unsuccessful given the angle of approach through the abdominal wall. Kumpe the catheter was replaced to the duodenum and the Rose an wire was exchanged for stiff Amplatz 160 cm wire. We were then successful with placing the 12 [place] biliary drain over the wire into the duodenum, with the proximal side holes positioned just proximal to the presumed site of occlusion/tumor. Drain was sutured in position.  Final images were stored. Gravity drain was attached. Patient tolerated the procedure well and remained hemodynamically stable throughout. No complications were encountered and no significant blood loss. IMPRESSION: Status post routine exchange and up sizing of PTC/drainage with a new 12 French drain placed. 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 Signed, Dulcy Fanny. Dellia Nims, RPVI [facility] Radiology Electronically Signed   By: Corrie Mckusick D.O.   On: 02/09/2018 10:41   Ir Endoluminal Bx Of Biliary Tree  Result  Date: 02/09/2018 INDICATION: 85 year old female with a history of central obstructing mass in the liver with biliary ductal dilatation and jaundice, initially treated with right-sided PTC and drainage 02/02/2018. She presents today for repositioning of the drain  which has been partially withdrawn and a brush biopsy EXAM: ULTRASOUND-GUIDED THROUGH THE TUBE CHOLANGIOGRAM AND EXCHANGE OF WITHDRAWN BILIARY TUBE PLACEMENT OF NEW 12 FRENCH DRAIN ENDOLUMINAL BRUSH BIOPSY MEDICATIONS: None ANESTHESIA/SEDATION: Moderate (conscious) sedation was employed during this procedure. A total of Versed 2.5 mg and Fentanyl 125 mcg was administered intravenously. Moderate Sedation Time: 25 minutes. The patient's level of consciousness and vital signs were monitored continuously by radiology nursing throughout the procedure under my direct supervision. FLUOROSCOPY TIME:  Fluoroscopy Time: 6 minutes 18 seconds (192 mGy). COMPLICATIONS: None PROCEDURE: Informed written consent was obtained from the patient after a thorough discussion of the procedural risks, benefits and alternatives. All questions were addressed. Maximal Sterile Barrier Technique was utilized including caps, mask, sterile gowns, sterile gloves, sterile drape, hand hygiene and skin antiseptic. A timeout was performed prior to the initiation of the procedure. Patient positioned supine position on the fluoroscopy table. Patient is prepped and draped in the usual sterile fashion. Scout images were acquired of the upper abdomen. Through the tube cholangiogram was performed demonstrating withdrawal of the previously existing 10 [place] biliary drain. A Glidewire was navigated into the duodenum through the existing tract. Drain was removed from the [employer]. A short Kumpe the catheter was then navigated into the duodenum. Rose in wire was placed through the Kumpe the catheter and the Kumpe the catheter was withdrawn. A 7 French 35 cm bright tip sheath was then placed over the  Rose an wire into the biliary system. Introducer was withdrawn and a pull-back cholangiogram was performed. The targeted narrowing was just cephalad of the clips in the liver hilum, at a region of ductal narrowing in the presumed area of tumor. Introducer was placed and the sheath was then again pushed beyond the occlusion. Coaxial cytology brush biopsy kit was advanced through the sheath, parallel to the safety wire. The sheath was then withdrawn and the brush biopsy kit was withdrawn into the region of narrowing with agitation across the lesion. Brush was then withdrawn placed into saline comp for cytology. This biopsy was then repeated for a total of 2 brush biopsies. The sheath was then withdrawn and we attempted to place a 12 [place] biliary drain over the Rose an wire. The tract required 12 French dilation. The 12 French drain was again attempted to place over the [facility] an wire, unsuccessful given the angle of approach through the abdominal wall. Kumpe the catheter was replaced to the duodenum and the Rose an wire was exchanged for stiff Amplatz 160 cm wire. We were then successful with placing the 12 [place] biliary drain over the wire into the duodenum, with the proximal side holes positioned just proximal to the presumed site of occlusion/tumor. Drain was sutured in position.  Final images were stored. Gravity drain was attached. Patient tolerated the procedure well and remained hemodynamically stable throughout. No complications were encountered and no significant blood loss. IMPRESSION: Status post routine exchange and up sizing of PTC/drainage with a new 12 French drain placed. 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 Signed, Dulcy Fanny. Dellia Nims, RPVI [facility] Radiology Electronically Signed   By: Corrie Mckusick D.O.   On: 02/09/2018 10:41        Scheduled Meds: . amLODipine  10 mg Oral Daily   . feeding supplement (ENSURE ENLIVE)  237 mL Oral TID BM  . methylPREDNISolone (SOLU-MEDROL) injection  40 mg Intravenous Daily  . mometasone-formoterol  2 puff Inhalation  BID  . multivitamin with minerals  1 tablet Oral Daily  . nebivolol  20 mg Oral Daily  . polyethylene glycol  17 g Oral BID  . senna-docusate  1 tablet Oral BID  . sodium chloride flush  5 mL Intracatheter Q8H  . traMADol  50 mg Oral BID   Continuous Infusions: . heparin 1,300 Units/hr (02/10/18 1150)     LOS: 9 days     Vernell Leep, MD, FACP, [facility]. [facility] Pager [phone]  If 7PM-7AM, please contact night-coverage [email or web address] Password [facility] 02/10/2018, 4:03 PM

## 2018-02-10 NOTE — Progress Notes (Signed)
Jordan Byrd for Heparin Indication: atrial fibrillation  Allergies  Allergen Reactions  . Methocarbamol Hives  . Vicodin [Hydrocodone-Acetaminophen] Other (See Comments)    Interacts with bp medication and drops blood pressure  . Aspirin Nausea And Vomiting  . Butazolidin [Phenylbutazone] Other (See Comments)    Unknown reaction  . Celebrex [Celecoxib] Nausea And Vomiting  . Codeine Nausea And Vomiting  . Doripenem Rash  . Aspirin Buf(Alhyd-Mghyd-Cacar) Nausea And Vomiting  . Mucinex [Guaifenesin Er] Itching  . Temazepam Other (See Comments)    Unknown reaction   Patient Measurements: Height: 5\' 6"  (167.6 cm) Weight: 248 lb 10.9 oz (112.8 kg) IBW/kg (Calculated) : 59.3 Heparin Dosing Weight: 84.8 kg  Vital Signs: Temp: 98.8 F (37.1 C) (01/08 0715) Temp Source: Oral (01/08 0715) BP: 146/59 (01/08 0715) Pulse Rate: 70 (01/08 0715)  Labs: Recent Labs    02/08/18 0531 02/08/18 1140 02/09/18 0512 02/09/18 1136 02/09/18 2236 02/10/18 0642 02/10/18 0950  HGB 10.2*  --   --  10.5*  --  10.3*  --   HCT 34.3*  --   --  34.0*  --  34.3*  --   PLT 438*  --   --  500*  --  447*  --   APTT 84* 91*  --   --  35  --   --   LABPROT  --   --  14.1  --   --   --   --   INR  --   --  1.10  --   --   --   --   HEPARINUNFRC 0.85*  --   --   --   --  0.32 0.31  CREATININE 1.23*  --   --  1.04*  --  0.87  --     Estimated Creatinine Clearance: 63.5 mL/min (by C-G formula based on SCr of 0.87 mg/dL).   Medical History: Past Medical History:  Diagnosis Date  . Anemia   . Arthritis    "all over" (02/01/2018)  . Asthma   . Atrial fibrillation ([place])    on Xarelto  . Breast cancer, left breast ([place]) 05/19/13   left breast stage IIA   . CHF (congestive heart failure) ([place])   . COPD (chronic obstructive pulmonary disease) ([place])   . GERD (gastroesophageal reflux disease)    [name] Prilosec  . Hypertension   . Macular degeneration   .  Obstructive jaundice 02/01/2018  . On home oxygen therapy    "2.5L prn" (02/01/2018)  . Osteoarthritis    a. 03/28/11 - R Total Hip Arthroplasty  . Pleurisy   . Pneumonia    "2-3 times" (02/01/2018)   Assessment: 85 yo F presents with incidental finding of hepatic mass. On Xarelto PTA for Afib. Holding for biliary drain and liver biopsy. Last dose was 12/29 in the am. Now s/p biliary drain placement to start heparin as may need another drain as well as pending liver biopsy.  Patient now s/p biliary drain revision and brush biopsy. Heparin restarted 1/7. Heparin level this AM 0.31 (IV had previously infiltrated)   Goal of Therapy:  Heparin level 0.3-0.7 units/mL APTT 66-102 secs Monitor platelets by anticoagulation protocol: Yes   Plan:  Increase heparin to 1300 units/hr HL in AM Daily HL/APTT   Isabella Roemmich A. Levada Dy, PharmD, [place] Pager: [phone] Please utilize Amion for appropriate phone number to reach the unit pharmacist ([place])   02/10/2018,11:38 AM

## 2018-02-10 NOTE — Progress Notes (Signed)
Occupational Therapy Treatment Patient Details Name: Jordan Byrd MRN: [id number] DOB: Aug 25, 1933 Today's Date: 02/10/2018    History of present illness 85 y.o. female with medical history significant of HTN; CHF; remote breast cancer; and afib on Xarelto presenting with a fall. Dx of jaundice, new liver mass noted on imaging, s/p percutaneous biliary drain 02/02/18. S/p exchange of biliary drain and brush biopsy on 02/09/2018.   OT comments  Pt sat EOB and performed bathing/changed gown.  Pt with multiple lines and LUE finger splinted; R rotator cuff problem, bil carpal tunnel syndrome which impair adls.  Tolerated sitting up well and did not have nausea   Follow Up Recommendations  SNF;Supervision/Assistance - 24 hour    Equipment Recommendations  3 in 1 bedside commode(if she doesn't have this)    Recommendations for Other Services      Precautions / Restrictions Precautions Precautions: Fall Precaution Comments: Drain, watch O2 sats, 8 falls since November Restrictions Weight Bearing Restrictions: No       Mobility Bed Mobility     Rolling: Min assist(To R)   Supine to sit: Max assist Sit to supine: Mod assist   General bed mobility comments: assist for legs and trunk to sit up and for bil LEs for back to bed  Transfers                 General transfer comment: not performed this session    Balance     Sitting balance-Leahy Scale: Fair                                     ADL either performed or assessed with clinical judgement   ADL       Grooming: Wash/dry face;Set up;Sitting   Upper Body Bathing: Moderate assistance;Maximal assistance;Sitting   Lower Body Bathing: Maximal assistance;Sit to/from stand                         General ADL Comments: sat at EOB for bathing. Pt did help with peri area (max A ) from supine. Pt limited by multiple lines and RUE difficulties as outlined above     Vision        Perception     Praxis      Cognition Arousal/Alertness: Awake/alert Behavior During Therapy: WFL for tasks assessed/performed Overall Cognitive Status: Within Functional Limits for tasks assessed                                          Exercises     Shoulder Instructions       General Comments      Pertinent Vitals/ Pain       Pain Assessment: Faces Faces Pain Scale: Hurts a little bit Pain Location: R shoulder Pain Descriptors / Indicators: Sore Pain Intervention(s): Limited activity within patient's tolerance;Monitored during session  Home Living                                          Prior Functioning/Environment              Frequency  Min 2X/week        Progress Toward Goals  OT Goals(current  goals can now be found in the care plan section)  Progress towards OT goals: Progressing toward goals     Plan      Co-evaluation                 AM-PAC OT "6 Clicks" Daily Activity     Outcome Measure   Help from another person eating meals?: A Little Help from another person taking care of personal grooming?: A Little Help from another person toileting, which includes using toliet, bedpan, or urinal?: A Lot Help from another person bathing (including washing, rinsing, drying)?: A Lot Help from another person to put on and taking off regular upper body clothing?: A Lot Help from another person to put on and taking off regular lower body clothing?: A Lot 6 Click Score: 14    End of Session    OT Visit Diagnosis: Unsteadiness on feet (R26.81);Other abnormalities of gait and mobility (R26.89);Muscle weakness (generalized) (M62.81);Other symptoms and signs involving cognitive function;History of falling (Z91.81);Repeated falls (R29.6)   Activity Tolerance Patient tolerated treatment well   Patient Left in bed;with call bell/phone within reach;with bed alarm set   Nurse Communication          Time:  3382-5053 OT Time Calculation (min): 43 min  Charges: OT General Charges $OT Visit: 1 Visit OT Treatments $Self Care/Home Management : 38-52 mins  Lesle Chris, OTR/L Acute Rehabilitation Services [phone] [place] pager [phone] office 02/10/2018   Percy Comp 02/10/2018, 3:49 PM

## 2018-02-10 NOTE — Progress Notes (Signed)
OT Cancellation Note  Patient Details Name: Jordan Byrd MRN: [id number] DOB: [birth year]   Cancelled Treatment:    Reason Eval/Treat Not Completed: Other (comment). Pt was limited by nausea this am. Will try to return this pm, if schedule permits.  [name] 02/10/2018, 1:15 PM  Lesle Chris, OTR/L Acute Rehabilitation Services [phone] [facility] pager [phone] office 02/10/2018

## 2018-02-10 NOTE — Progress Notes (Signed)
Nutrition Follow-up  DOCUMENTATION CODES:   Obesity unspecified  INTERVENTION:    Increase Ensure Enlive po TID, each supplement provides 350 kcal and 20 grams of protein  Provide MVI daily  NUTRITION DIAGNOSIS:   Inadequate oral intake related to nausea, poor appetite as evidenced by per patient/family report.  Ongoing-nausea   GOAL:   Patient will meet greater than or equal to 90% of their needs  Not meeting  MONITOR:   PO intake, Supplement acceptance, Diet advancement, Labs, Weight trends  REASON FOR ASSESSMENT:   Malnutrition Screening Tool    ASSESSMENT:    85 yo female admitted with obstructive juandice with CT showing showing baseball-sized mass in L lobe of liver, highly suspicious for cholangiocarcinoma. PMH includes HTN, CHF, remote breast cancer   12/31 PTC and placement of internal/external biliary drain  1/7- biliary tube replaced, brush liver biopsy   Pt awaiting biopsy results.   Pt complains of nausea upon follow up (given Zofran shortly before RD arrival). Pt reports her nausea has been off/on since admit which has prevented her from consuming meals. Meal completions charted inconsistently as 0-100% for her last eight meals. Pt ate 50% of her eggs, grits, and bacon this morning. RD observed oranges and mini muffins at bedside (pt eats oranges inconsistently). Pt has tolerated Ensure Enlive BID and is willing to try TID until appetite returns.   Weight noted to decrease from 250 lb on 1/4 to 249 lb today. IV lasix are being weaned. Nutrition-Focused physical exam completed.  Medications reviewed and include: solumedrol, miralax, senokot Labs reviewed: Phosphorus 4.9 (H)   NUTRITION - FOCUSED PHYSICAL EXAM:    Most Recent Value  Orbital Region  No depletion  Upper Arm Region  No depletion  Thoracic and Lumbar Region  Unable to assess  Buccal Region  No depletion  Temple Region  Mild depletion  Clavicle Bone Region  No depletion  Clavicle and  Acromion Bone Region  No depletion  Scapular Bone Region  Unable to assess  Dorsal Hand  Mild depletion  Patellar Region  No depletion  Anterior Thigh Region  No depletion  Posterior Calf Region  No depletion  Edema (RD Assessment)  Mild     Diet Order:   Diet Order            DIET SOFT Room service appropriate? Yes; Fluid consistency: Thin  Diet effective now              EDUCATION NEEDS:   No education needs have been identified at this time  Skin:  Skin Assessment: Reviewed RN Assessment  Last BM:  02/08/17  Height:   Ht Readings from Last 1 Encounters:  02/02/18 '5\' 6"'  (1.676 m)    Weight:   Wt Readings from Last 1 Encounters:  02/09/18 112.8 kg    Ideal Body Weight:  59 kg  BMI:  Body mass index is 40.14 kg/m.  Estimated Nutritional Needs:   Kcal:  2000-2200 kcals   Protein:  110-120 g   Fluid:  >/= 2 L    Mariana Single RD, LDN Clinical Nutrition Pager # [phone]

## 2018-02-11 ENCOUNTER — Other Ambulatory Visit: Payer: Self-pay | Admitting: Oncology

## 2018-02-11 DIAGNOSIS — Z87891 Personal history of nicotine dependence: Secondary | ICD-10-CM

## 2018-02-11 DIAGNOSIS — K831 Obstruction of bile duct: Principal | ICD-10-CM

## 2018-02-11 DIAGNOSIS — C50412 Malignant neoplasm of upper-outer quadrant of left female breast: Secondary | ICD-10-CM

## 2018-02-11 DIAGNOSIS — R160 Hepatomegaly, not elsewhere classified: Secondary | ICD-10-CM

## 2018-02-11 LAB — HEPARIN LEVEL (UNFRACTIONATED): Heparin Unfractionated: 0.57 IU/mL (ref 0.30–0.70)

## 2018-02-11 LAB — HEPATIC FUNCTION PANEL
ALT: 158 U/L — ABNORMAL HIGH (ref 0–44)
AST: 60 U/L — ABNORMAL HIGH (ref 15–41)
Albumin: 2.2 g/dL — ABNORMAL LOW (ref 3.5–5.0)
Alkaline Phosphatase: 347 U/L — ABNORMAL HIGH (ref 38–126)
BILIRUBIN INDIRECT: 1.3 mg/dL — AB (ref 0.3–0.9)
Bilirubin, Direct: 1.1 mg/dL — ABNORMAL HIGH (ref 0.0–0.2)
Total Bilirubin: 2.4 mg/dL — ABNORMAL HIGH (ref 0.3–1.2)
Total Protein: 6.3 g/dL — ABNORMAL LOW (ref 6.5–8.1)

## 2018-02-11 NOTE — Progress Notes (Addendum)
PROGRESS NOTE   Jordan Byrd  PYP:[id number]    DOB: 04/01/1933    DOA: 02/01/2018  PCP: Levin Erp, MD   I have briefly reviewed patients previous medical records in [facility].  Brief Narrative:  85 year old female with PMH of HTN, CHF, remote breast cancer, A. fib on Xarelto presented to the ED after a fall at home/frequent falls.  She reportedly has been losing balance a lot since Thanksgiving and her knees have been giving out.  Family noticed yellowish discoloration.  In ED, finger dislocation, relocated and splint in place.  Jaundiced with bilirubin of 13.6.  [facility], IR and oncology consulted.  Underwent extensive evaluation as noted below.  Cancer diagnosis not yet confirmed.   Assessment & Plan:   Principal Problem:   Obstructive jaundice Active Problems:   Hypertension   CKD (chronic kidney disease) stage 3, GFR 30-59 ml/min (HCC)   Chronic diastolic heart failure (HCC)   Atrial fibrillation (HCC)   Falls frequently   Obstructive painless jaundice/liver mass Presented with clinical jaundice, abnormal LFTs and total bilirubin of 13.6. CT abdomen and pelvis 12/30: Ill-defined central hepatic mass in the left lower lobe of liver measuring approximately 5.5 x 3.3 x 3.6 cm with associated severe intrahepatic biliary ductal dilatation.  Findings highly concerning for cholangiocarcinoma. [facility] consulted on 12/30 and recommended IR evaluation for liver biopsy and possible stenting of intrahepatic bile duct.  Since CBD of normal caliber, ERCP not indicated.   On 12/31, IR placed percutaneous 10 French internal/external drain in right liver lobe but unable to get good access from left hepatic ducts.  Fluid sent for culture and cytology. AFP was 6.5, CA-19-9 was 7571, and CA 125 was 67.6 and CEA was 4.0 S/p PTC with I/E biliary drain, bilirubin started to trend down. On 1/5, patient reported severe abdominal pain with associated worsening of LFTs including bilirubin.   Drain appeared to be malfunctioning. On 1/7, IR performed exchange of retracted right PTC/drain of biliary tree and new 12 [place] biliary drain placed.  Brush biopsy of the tight lesion of biliary tree performed. Xarelto held and patient on IV heparin bridge for procedures.  IR to advise timing of resumption of Xarelto. Cytopathology: Bile duct brushing from 02/09/2018: No malignant cells identified. Cytopathology: Bile duct brushing fluid 02/02/2018: No malignant cells identified. Consider oncology consultation. As discussed with IR on 1/9, no further mass on recent CT abdomen 1/5 to biopsy. I consulted Dr. Alen Blew, oncology and his input appreciated.  DD: Likely cholangiocarcinoma, less likely hepatocellular carcinoma but could also be benign biliary stricture.  He recommends PET scan as outpatient after a short interval of time and hopefully that will reveal something that can be biopsied to confirm malignancy.  However if malignancy is confirmed, she would not be a candidate for curative surgical resection but more palliative radiation therapy and chemotherapy.  He will arrange outpatient follow-up and PET/CT upon discharge.  He does not think that her current problem is related to prior breast cancer diagnosis.  Addendum Reviewed IR attending note, suggests abdominal MRI to look for a liver primary.  I discussed with IR team and ordered MR abdomen with and without contrast.  Hold off transitioning to Xarelto until study is complete in case lesion reveals for biopsy.  Discussed with patient.  Frequent falls Suspected due to profound weakness and deconditioning relating to underlying malignancy. Follow-up PT and OT evaluation recommend SNF, could possibly DC to SNF 1/10.  Paroxysmal A. Fib Currently  in sinus rhythm.  Rate controlled on nebivolol 20 mg daily.  Xarelto has been intermittently held for procedures, currently on IV heparin infusion. IR has cleared for patient to resume Xarelto  1/9.  Acute on chronic diastolic CHF Noted volume overload on 1/5.  Received a dose of IV Lasix.  -8.3 L since admission. TTE 02/08/2018: LVEF 55-60%.  Rate 1 diastolic dysfunction. No clinical bronchospasm, discontinued IV Solu-Medrol.  Acute on stage III chronic kidney disease In the setting of GI losses, losartan.  Losartan discontinued.  Creatinine has normalized.  Periodically monitor BMP.  Essential hypertension Controlled on amlodipine 10 mg daily and nebivolol 20 mg daily.  Losartan discontinued due to acute kidney injury.  Nausea and vomiting In the context of liver mass.  Improved.  Morbid obesity/Body mass index is 40.14 kg/m.  Right shoulder pain Suspected biliary origin and drain placement.  Resolved.  Normocytic anemia/iron deficiency anemia Stable.  Consider iron supplementation.  Asymptomatic bacteriuria No antibiotics recommended.  Suspected pulmonary nodules Noted on CTA chest which showed no PE.  Concern for pulmonary metastasis.  Needs follow-up.  Constipation Continue bowel regimen.  Leukocytosis Suspect due to steroids.  Almost back to normal.  DVT prophylaxis: Currently on IV heparin infusion. Code Status: DNR Family Communication: Discussed in detail with patient's daughter and grandson at bedside, updated care and answered questions. Disposition: DC to SNF pending clinical improvement, hopefully 1/10.   Consultants:  [facility] Interventional radiology Medical oncology  Procedures:  As noted above  Antimicrobials:  None at this time   Subjective: Ongoing intermittent nausea with poor oral intake.  No abdominal pain.  Denies any other complaints.  Reportedly has been out of bed to the chair.  ROS: As above, otherwise negative.  Objective:  Vitals:   02/10/18 2113 02/11/18 0501 02/11/18 0757 02/11/18 0820  BP: (!) 144/55 (!) 156/62  140/60  Pulse: 70 69  79  Resp: _0 Temp: 98 F (36.7 C) 98.2 F (36.8 C)    TempSrc: Oral  Oral    SpO2: 96% 97% 99% 98%  Weight:      Height:        Examination:  General exam: Pleasant elderly female, moderately built and morbidly obese lying comfortably supine in bed.  Does not appear in any distress. Respiratory system: Clear to auscultation. Respiratory effort normal.  Stable. Cardiovascular system: S1 & S2 heard, RRR. No JVD, murmurs, rubs, gallops or clicks. No pedal edema.  Telemetry personally reviewed: Sinus rhythm. Gastrointestinal system: Abdomen is nondistended, soft and nontender. No organomegaly or masses felt. Normal bowel sounds heard.  Right biliary drain intact without acute findings.  Stable without change. Central nervous system: Alert and oriented x2. No focal neurological deficits. Extremities: Symmetric 5 x 5 power. Skin: No rashes, lesions or ulcers Psychiatry: Judgement and insight appear normal. Mood & affect appropriate.     Data Reviewed: I have personally reviewed following labs and imaging studies  CBC: Recent Labs  Lab 02/05/18 0751 02/06/18 0641 02/07/18 0359 02/08/18 0531 02/09/18 1136 02/10/18 0642  WBC 11.4* 12.5* 11.1* 25.0* 17.0* 12.1*  NEUTROABS 8.1*  --  7.8* 24.8* 15.2* 10.1*  HGB 10.2* 10.3* 10.4* 10.2* 10.5* 10.3*  HCT 32.5* 32.8* 33.6* 34.3* 34.0* 34.3*  MCV 91.5 91.4 92.8 94.2 93.2 94.8  PLT 412* 417* 471* 438* 500* 037*   Basic Metabolic Panel: Recent Labs  Lab 02/06/18 0839 02/07/18 0359 02/08/18 0531 02/09/18 1136 02/10/18 0642  NA 136 136 134* 138  138  K 4.4 4.7 4.6 4.1 4.3  CL 101 100 100 101 101  CO2 25 27 20* 27 27  GLUCOSE 142* 92 154* 111* 82  BUN 16 22 32* 40* 37*  CREATININE 0.94 0.90 1.23* 1.04* 0.87  CALCIUM 8.8* 8.8* 8.5* 8.8* 8.8*  MG 1.8 2.0 2.1 2.4 2.2  PHOS 4.3 4.3 5.1* 3.8 4.9*   Liver Function Tests: Recent Labs  Lab 02/07/18 0359 02/08/18 0531 02/09/18 1136 02/10/18 0642 02/11/18 0714  AST 135* 145* 72* 78* 60*  ALT 175* 245* 179* 171* 158*  ALKPHOS 498* 496* 424* 358* 347*   BILITOT 3.8* 3.2* 2.7* 2.5* 2.4*  PROT 6.0* 6.4* 6.6 6.1* 6.3*  ALBUMIN 2.1* 2.3* 2.3* 2.2* 2.2*   Coagulation Profile: Recent Labs  Lab 02/09/18 0512  INR 1.10     Recent Results (from the past 240 hour(s))  Gram stain     Status: None   Collection Time: 02/02/18  1:00 PM  Result Value Ref Range Status   Specimen Description FLUID  Final   Special Requests   Final    NONE Performed at [facility], [address], [place], [place] [address]    Gram Stain   Final    CYTOSPIN SMEAR WBC PRESENT, PREDOMINANTLY PMN NO ORGANISMS SEEN    Report Status 02/02/2018 FINAL  Final  Culture, body fluid-bottle     Status: None   Collection Time: 02/02/18  1:00 PM  Result Value Ref Range Status   Specimen Description FLUID  Final   Special Requests   Final    NONE Performed at [facility], [place] [address], [place], [place] [address]    Culture NO GROWTH 5 DAYS  Final   Report Status 02/07/2018 FINAL  Final  Culture, Urine     Status: None   Collection Time: 02/06/18 10:12 AM  Result Value Ref Range Status   Specimen Description URINE, RANDOM  Final   Special Requests NONE  Final   Culture   Final    NO GROWTH Performed at [facility], [place] [address], [place], [place] [address]    Report Status 02/07/2018 FINAL  Final  Culture, blood (routine x 2)     Status: None (Preliminary result)   Collection Time: 02/07/18  8:12 PM  Result Value Ref Range Status   Specimen Description BLOOD RIGHT HAND  Final   Special Requests   Final    BOTTLES DRAWN AEROBIC AND ANAEROBIC Blood Culture adequate volume   Culture   Final    NO GROWTH 4 DAYS Performed at [facility], [place] [address] [place], [place] [address]    Report Status PENDING  Incomplete  Culture, blood (routine x 2)     Status: None (Preliminary result)   Collection Time: 02/07/18  9:34 PM  Result Value Ref Range Status   Specimen Description BLOOD LEFT HAND  Final   Special Requests   Final     BOTTLES DRAWN AEROBIC ONLY Blood Culture results may not be optimal due to an inadequate volume of blood received in culture bottles   Culture   Final    NO GROWTH 4 DAYS Performed at [facility], [place] [address], [place], [place] [address]    Report Status PENDING  Incomplete         Radiology Studies: No results found.      Scheduled Meds: . amLODipine  10 mg Oral Daily  . feeding  supplement (ENSURE ENLIVE)  237 mL Oral TID BM  . mometasone-formoterol  2 puff Inhalation BID  . multivitamin with minerals  1 tablet Oral Daily  . nebivolol  20 mg Oral Daily  . polyethylene glycol  17 g Oral BID  . senna-docusate  1 tablet Oral BID  . sodium chloride flush  5 mL Intracatheter Q8H  . traMADol  50 mg Oral BID   Continuous Infusions: . heparin 1,300 Units/hr (02/11/18 0952)     LOS: 10 days     Vernell Leep, MD, FACP, [facility]. [facility] Pager [phone]  If 7PM-7AM, please contact night-coverage [email or web address] Password [email or web address] 02/11/2018, 4:08 PM

## 2018-02-11 NOTE — Consult Note (Signed)
Reason for the request: Liver mass  HPI: I was asked by Dr. Algis Liming to evaluate Jordan Byrd for obstructive jaundice and possible bile duct tumor.  She is an 85 year old woman with a history of atrial fibrillation currently on Xarelto.  She sustained a fall which led to her hospitalization on February 01, 2018.  Leading up to that she has noticed feeling of nausea and poor appetite and weight loss at that time.  During her hospital stay, she was noted to be jaundice and CT scan on 02/01/2018 showed an ill-defined central hepatic mass in the left lobe of the liver measuring 5.5 x 3.3 x 3.6 cm with intrahepatic biliary duct dilation.  She subsequently underwent biliary stent placement on 02/02/2018.  She had a exchange of her right percutaneous drain of the biliary tree done on February 09, 2018 with a brush biopsy obtained at the same time.  The biopsy results showed no malignant cells noted on 2 separate occasions.  Repeat imaging studies on February 07, 2018 including CT angiogram showed no evidence of pulmonary embolism but small multiple bilateral noncalcified pulmonary nodule measuring 7 mm in the largest diameter.  These are nonspecific findings.  There is also persistent intrahepatic biliary dilation the left lobe of the liver with decrease right biliary dilation.  No other masses or lesions noted.  In the interim her bilirubin has declined to 2.4 from 16.5.  Her alkaline phosphatase is also down to 347 from 1098.  Clinically, she still has issues with nausea and poor p.o. intake but no abdominal distention.  Her jaundice is improved and she does have pain in her finger that is currently splinted after her recent falls.  She does have history of breast cancer that was treated with surgery alone without any additional therapy in 2015.  She does not report any headaches, blurry vision, syncope or seizures. Does not report any fevers, chills or sweats.  Does not report any cough, wheezing or hemoptysis.  Does  not report any chest pain, palpitation, orthopnea or leg edema.  Does not report any vomiting or distention. Does not report any constipation or diarrhea.  Does not report any skeletal complaints.    Does not report frequency, urgency or hematuria.  Does not report any skin rashes or lesions. Does not report any heat or cold intolerance.  Does not report any lymphadenopathy or petechiae.  Does not report any anxiety or depression.  Remaining review of systems is negative.    Past Medical History:  Diagnosis Date  . Anemia   . Arthritis    "all over" (02/01/2018)  . Asthma   . Atrial fibrillation ([place])    on Xarelto  . Breast cancer, left breast ([place]) 05/19/13   left breast stage IIA   . CHF (congestive heart failure) ([place])   . COPD (chronic obstructive pulmonary disease) ([place])   . GERD (gastroesophageal reflux disease)    [name] Prilosec  . Hypertension   . Macular degeneration   . Obstructive jaundice 02/01/2018  . On home oxygen therapy    "2.5L prn" (02/01/2018)  . Osteoarthritis    a. 03/28/11 - R Total Hip Arthroplasty  . Pleurisy   . Pneumonia    "2-3 times" (02/01/2018)  :  Past Surgical History:  Procedure Laterality Date  . ABDOMINAL HYSTERECTOMY  1962  . ADENOIDECTOMY  [id number]   "grew back 6 times"  . ANTERIOR CERVICAL DECOMP/DISCECTOMY FUSION    . APPENDECTOMY  1954  . BACK SURGERY    .  BREAST LUMPECTOMY WITH NEEDLE LOCALIZATION Left 07/14/2013   Procedure: BREAST LUMPECTOMY WITH NEEDLE LOCALIZATION;  Surgeon: Stark Klein, MD;  Location: [facility];  Service: General;  Laterality: Left;  . BREAST SURGERY    . CARDIAC CATHETERIZATION     1980's or 90's - reportedly normal  . CARDIOVERSION  04/05/2011   Procedure: CARDIOVERSION;  Surgeon: Coralyn Mark, MD;  Location: [facility];  Service: Cardiovascular;  Laterality: N/A;  . CARDIOVERSION N/A 06/19/2012   Procedure: CARDIOVERSION-bedside([id number]);  Surgeon: Lelon Perla, MD;  Location[facility];  Service: Cardiovascular;   Laterality: N/A;  . CATARACT EXTRACTION BILATERAL W/ ANTERIOR VITRECTOMY Bilateral 2007/2008  . CERVICAL DISC SURGERY    . CHOLECYSTECTOMY OPEN    . COLONOSCOPY     "I don't know but I ain't gonna have no more"  . DILATION AND CURETTAGE OF UTERUS    . EXCISION / BIOPSY BREAST / NIPPLE / DUCT     left breast  . INCISION AND DRAINAGE Right 1947   "stuck nail in my foot"  . IR ENDOLUMINAL BX OF BILIARY TREE  02/09/2018  . IR EXCHANGE BILIARY DRAIN  02/09/2018  . IR INT EXT BILIARY DRAIN WITH CHOLANGIOGRAM  02/02/2018  . JOINT REPLACEMENT    . KNEE ARTHROSCOPY Bilateral   . LUMBAR DISC SURGERY  10/30/2008   L2-S1  . PLANTAR'S WART EXCISION Bilateral   . SHOULDER OPEN ROTATOR CUFF REPAIR Left   . TONSILLECTOMY AND ADENOIDECTOMY  1943  . TOTAL HIP ARTHROPLASTY  03/28/2011   Procedure: TOTAL HIP ARTHROPLASTY;  Surgeon: Yvette Rack., MD;  Location: [facility];  Service: Orthopedics;  Laterality: Right;  :   Current Facility-Administered Medications:  .  acetaminophen (TYLENOL) tablet 650 mg, 650 mg, Oral, Q6H PRN, 650 mg at 02/09/18 1519 **OR** acetaminophen (TYLENOL) suppository 650 mg, 650 mg, Rectal, Q6H PRN, Karmen Bongo, MD .  albuterol (PROVENTIL) (2.5 MG/3ML) 0.083% nebulizer solution 3 mL, 3 mL, Inhalation, Q6H PRN, Karmen Bongo, MD, 3 mL at 02/07/18 1147 .  amLODipine (NORVASC) tablet 10 mg, 10 mg, Oral, Daily, Karmen Bongo, MD, 10 mg at 02/11/18 0943 .  feeding supplement (ENSURE ENLIVE) (ENSURE ENLIVE) liquid 237 mL, 237 mL, Oral, TID BM, Hongalgi, Anand D, MD, 237 mL at 02/11/18 0944 .  heparin ADULT infusion 100 units/mL (25000 units/249mL sodium chloride 0.45%), 1,300 Units/hr, Intravenous, Continuous, Joselyn Glassman A, RPH, Last Rate: 13 mL/hr at 02/11/18 0952, 1,300 Units/hr at 02/11/18 0952 .  mometasone-formoterol (DULERA) 200-5 MCG/ACT inhaler 2 puff, 2 puff, Inhalation, BID, Karmen Bongo, MD, 2 puff at 02/11/18 0756 .  morphine 2 MG/ML injection 2 mg, 2 mg,  Intravenous, Q2H PRN, Sheikh, Omair Latif, DO .  multivitamin with minerals tablet 1 tablet, 1 tablet, Oral, Daily, Hongalgi, Anand D, MD, 1 tablet at 02/11/18 0943 .  nebivolol (BYSTOLIC) tablet 20 mg, 20 mg, Oral, Daily, Karmen Bongo, MD, 20 mg at 02/11/18 0944 .  ondansetron (ZOFRAN) tablet 4 mg, 4 mg, Oral, Q6H PRN, 4 mg at 02/02/18 1358 **OR** ondansetron (ZOFRAN) injection 4 mg, 4 mg, Intravenous, Q6H PRN, Karmen Bongo, MD, 4 mg at 02/11/18 1111 .  polyethylene glycol (MIRALAX / GLYCOLAX) packet 17 g, 17 g, Oral, BID, Raiford Noble [place], DO, 17 g at 02/11/18 0350 .  promethazine (PHENERGAN) injection 12.5 mg, 12.5 mg, Intravenous, Q6H PRN, Raiford Noble Latif, DO, 12.5 mg at 02/07/18 1147 .  senna-docusate (Senokot-S) tablet 1 tablet, 1 tablet, Oral, BID, Sheikh, [place], DO, 1 tablet at  02/11/18 0943 .  sodium chloride flush (NS) 0.9 % injection 5 mL, 5 mL, Intracatheter, Q8H, Wagner, Jaime, DO, 5 mL at 02/10/18 2256 .  traMADol (ULTRAM) tablet 50 mg, 50 mg, Oral, BID, Karmen Bongo, MD, 50 mg at 02/11/18 2831:  Allergies  Allergen Reactions  . Methocarbamol Hives  . Vicodin [Hydrocodone-Acetaminophen] Other (See Comments)    Interacts with bp medication and drops blood pressure  . Aspirin Nausea And Vomiting  . Butazolidin [Phenylbutazone] Other (See Comments)    Unknown reaction  . Celebrex [Celecoxib] Nausea And Vomiting  . Codeine Nausea And Vomiting  . Doripenem Rash  . Aspirin Buf(Alhyd-Mghyd-Cacar) Nausea And Vomiting  . Mucinex [Guaifenesin Er] Itching  . Temazepam Other (See Comments)    Unknown reaction  :  Family History  Problem Relation Age of Onset  . Diabetes Father        Father, Mother, 4 sisters (2 living)  . Heart attack Father        (Deceased)  . Heart failure Father        (deceased 51)  . Hypertension Father   . Stroke Mother        (deceased 44)  . Hypertension Mother   . Cancer Sister 59       breast cancer  . Breast cancer Sister  40  . Heart attack Sister   . Diabetes Sister   . Cancer Other 12       niece with breast cancer  :  Social History   Socioeconomic History  . Marital status: Widowed    Spouse name: Not on file  . Number of children: Not on file  . Years of education: Not on file  . Highest education level: Not on file  Occupational History  . Occupation: Retired  Scientific laboratory technician  . Financial resource strain: Not on file  . Food insecurity:    Worry: Not on file    Inability: Not on file  . Transportation needs:    Medical: Not on file    Non-medical: Not on file  Tobacco Use  . Smoking status: Former Smoker    Packs/day: 1.00    Years: 21.00    Pack years: 21.00    Types: Cigarettes    Last attempt to quit: 08/15/1970    Years since quitting: 47.5  . Smokeless tobacco: Never Used  Substance and Sexual Activity  . Alcohol use: Not Currently    Alcohol/week: 0.0 standard drinks  . Drug use: Never  . Sexual activity: Not Currently  Lifestyle  . Physical activity:    Days per week: Not on file    Minutes per session: Not on file  . Stress: Not on file  Relationships  . Social connections:    Talks on phone: Not on file    Gets together: Not on file    Attends religious service: Not on file    Active member of club or organization: Not on file    Attends meetings of clubs or organizations: Not on file    Relationship status: Not on file  . Intimate partner violence:    Fear of current or ex partner: Not on file    Emotionally abused: Not on file    Physically abused: Not on file    Forced sexual activity: Not on file  Other Topics Concern  . Not on file  Social History Narrative   Lives alone in [place].  Widowed in 2012 (husband w/ dementia & parkinsons).Marland Kitchen  Not active, does not drive due to vision problems.  :  Pertinent items are noted in HPI.  Exam: Blood pressure 140/60, pulse 79, temperature 98.2 F (36.8 C), temperature source Oral, resp. rate 18, height 5\' 6"  (1.676  m), weight 248 lb 10.9 oz (112.8 kg), SpO2 98 %.  ECOG 1 General appearance: Chronically ill-appearing woman without distress today. Head: atraumatic without any abnormalities. Eyes: conjunctivae/corneas clear. PERRL.  Sclera anicteric. Throat: lips, mucosa, and tongue normal; without oral thrush or ulcers. Resp: clear to auscultation bilaterally without rhonchi, wheezes or dullness to percussion. Cardio: regular rate and rhythm, S1, S2 normal, no murmur, click, rub or gallop GI: soft, non-tender; bowel sounds normal; no masses,  no organomegaly Skin: Skin color, texture, turgor normal. No rashes or lesions Lymph nodes: Cervical, supraclavicular, and axillary nodes normal. Neurologic: Grossly normal without any motor, sensory or deep tendon reflexes. Musculoskeletal: No joint deformity or effusion.  Recent Labs    02/09/18 1136 02/10/18 0642  WBC 17.0* 12.1*  HGB 10.5* 10.3*  HCT 34.0* 34.3*  PLT 500* 447*   Recent Labs    02/09/18 1136 02/10/18 0642  NA 138 138  K 4.1 4.3  CL 101 101  CO2 27 27  GLUCOSE 111* 82  BUN 40* 37*  CREATININE 1.04* 0.87  CALCIUM 8.8* 8.8*      Ct Angio Chest Pe W Or Wo Contrast  Result Date: 02/07/2018 CLINICAL DATA:  Dyspnea, wheezing, history asthma, CHF, worsened symptoms since yesterday, hyperventilation, abdominal distension; history of biliary dilatation and central biliary obstruction post percutaneous drainage EXAM: CT ANGIOGRAPHY CHEST CT ABDOMEN AND PELVIS WITH CONTRAST TECHNIQUE: Multidetector CT imaging of the chest was performed using the standard protocol during bolus administration of intravenous contrast. Multiplanar CT image reconstructions and MIPs were obtained to evaluate the vascular anatomy. Multidetector CT imaging of the abdomen and pelvis was performed using the standard protocol during bolus administration of intravenous contrast. CONTRAST:  166mL ISOVUE-370 IOPAMIDOL (ISOVUE-370) INJECTION 76% IV. COMPARISON:  CT abdomen  and pelvis 02/01/2018 FINDINGS: CTA CHEST FINDINGS Cardiovascular: Atherosclerotic calcifications of aorta, proximal great vessels and coronary arteries. Cardiac chambers appear mildly enlarged. No pericardial effusion. Pulmonary arteries adequately opacified and patent. No evidence of pulmonary embolism. Mediastinum/Nodes: Base of cervical region normal appearance. No thoracic adenopathy. Esophagus unremarkable. Lungs/Pleura: Small focus of infiltrate or atelectasis at anterior LEFT upper lobe image 51. Multiple tiny BILATERAL lung nodules, noncalcified. 7 mm LEFT lower lobe nodule image 50. Remaining lungs clear. No infiltrate, pleural effusion or pneumothorax Musculoskeletal: Osseous demineralization. Prior cervical spine fusion. No definite bone lesions. Review of the MIP images confirms the above findings. CT ABDOMEN and PELVIS FINDINGS Hepatobiliary: Percutaneous biliary drain decompression of the RIGHT biliary system since previous exam. Persistent dilatation of the intrahepatic biliary radicles of the LEFT lobe. No focal hepatic mass lesion or visualized obstructing mass at the central biliary tree. Gallbladder surgically absent. Pancreas: Normal appearance Spleen: Normal appearance Adrenals/Urinary Tract: Adrenal glands normal appearance. Tiny cyst at inferior pole RIGHT kidney. Kidneys, ureters, and bladder otherwise unremarkable. Stomach/Bowel: Appendix surgically absent by history. Dense retained contrast in transverse colon. Stomach decompressed. Stomach and bowel loops otherwise unremarkable. Vascular/Lymphatic: Atherosclerotic calcifications of aorta and iliac arteries. Aorta normal caliber. No adenopathy. Reproductive: Uterus surgically absent with nonvisualization of ovaries Other: No free air or free fluid. No hernia or acute inflammatory process. Musculoskeletal: No acute osseous lesions. RIGHT hip prosthesis noted. Degenerative disc and facet disease changes lumbar spine. Review of the MIP images  confirms the above findings. IMPRESSION: No evidence of pulmonary embolism. Multiple small BILATERAL noncalcified pulmonary nodules measuring up to 7 mm in largest diameter in LEFT lobe, nonspecific but pulmonary metastases are not excluded. Persistent intrahepatic biliary dilatation involving the LEFT lobe of the liver with decreased biliary dilatation of the RIGHT lobe biliary tree post percutaneous drainage. Scattered atherosclerotic calcifications including within coronary arteries. Electronically Signed   By: Lavonia Dana M.D.   On: 02/07/2018 17:33     Result Date: 02/07/2018 CLINICAL DATA:  Dyspnea, wheezing, history asthma, CHF, worsened symptoms since yesterday, hyperventilation, abdominal distension; history of biliary dilatation and central biliary obstruction post percutaneous drainage EXAM: CT ANGIOGRAPHY CHEST CT ABDOMEN AND PELVIS WITH CONTRAST TECHNIQUE: Multidetector CT imaging of the chest was performed using the standard protocol during bolus administration of intravenous contrast. Multiplanar CT image reconstructions and MIPs were obtained to evaluate the vascular anatomy. Multidetector CT imaging of the abdomen and pelvis was performed using the standard protocol during bolus administration of intravenous contrast. CONTRAST:  137mL ISOVUE-370 IOPAMIDOL (ISOVUE-370) INJECTION 76% IV. COMPARISON:  CT abdomen and pelvis 02/01/2018 FINDINGS: CTA CHEST FINDINGS Cardiovascular: Atherosclerotic calcifications of aorta, proximal great vessels and coronary arteries. Cardiac chambers appear mildly enlarged. No pericardial effusion. Pulmonary arteries adequately opacified and patent. No evidence of pulmonary embolism. Mediastinum/Nodes: Base of cervical region normal appearance. No thoracic adenopathy. Esophagus unremarkable. Lungs/Pleura: Small focus of infiltrate or atelectasis at anterior LEFT upper lobe image 51. Multiple tiny BILATERAL lung nodules, noncalcified. 7 mm LEFT lower lobe nodule image  50. Remaining lungs clear. No infiltrate, pleural effusion or pneumothorax Musculoskeletal: Osseous demineralization. Prior cervical spine fusion. No definite bone lesions. Review of the MIP images confirms the above findings. CT ABDOMEN and PELVIS FINDINGS Hepatobiliary: Percutaneous biliary drain decompression of the RIGHT biliary system since previous exam. Persistent dilatation of the intrahepatic biliary radicles of the LEFT lobe. No focal hepatic mass lesion or visualized obstructing mass at the central biliary tree. Gallbladder surgically absent. Pancreas: Normal appearance Spleen: Normal appearance Adrenals/Urinary Tract: Adrenal glands normal appearance. Tiny cyst at inferior pole RIGHT kidney. Kidneys, ureters, and bladder otherwise unremarkable. Stomach/Bowel: Appendix surgically absent by history. Dense retained contrast in transverse colon. Stomach decompressed. Stomach and bowel loops otherwise unremarkable. Vascular/Lymphatic: Atherosclerotic calcifications of aorta and iliac arteries. Aorta normal caliber. No adenopathy. Reproductive: Uterus surgically absent with nonvisualization of ovaries Other: No free air or free fluid. No hernia or acute inflammatory process. Musculoskeletal: No acute osseous lesions. RIGHT hip prosthesis noted. Degenerative disc and facet disease changes lumbar spine. Review of the MIP images confirms the above findings. IMPRESSION: No evidence of pulmonary embolism. Multiple small BILATERAL noncalcified pulmonary nodules measuring up to 7 mm in largest diameter in LEFT lobe, nonspecific but pulmonary metastases are not excluded. Persistent intrahepatic biliary dilatation involving the LEFT lobe of the liver with decreased biliary dilatation of the RIGHT lobe biliary tree post percutaneous drainage. Scattered atherosclerotic calcifications including within coronary arteries. Electronically Signed   By: Lavonia Dana M.D.   On: 02/07/2018 17:33   Ct Abdomen Pelvis W  Contrast  Result Date: 02/01/2018 CLINICAL DATA:  85 year old female with history of jaundice. Dark urine. EXAM: CT ABDOMEN AND PELVIS WITH CONTRAST TECHNIQUE: Multidetector CT imaging of the abdomen and pelvis was performed using the standard protocol following bolus administration of intravenous contrast. CONTRAST:  122mL OMNIPAQUE IOHEXOL 300 MG/ML  SOLN COMPARISON:  No priors. FINDINGS: Lower chest: Aortic atherosclerosis. Atherosclerotic calcifications in the right coronary artery. Hepatobiliary: In the left  lobe of the liver there is a central lesion that is poorly defined but estimated to measure approximately 5.5 x 3.3 x 3.6 cm (axial image 18 of series 3 and coronal image 51 of series 6). This is associated with severe intrahepatic biliary ductal dilatation, which is most severe throughout segments 2 and 3, but involves all areas of the liver. Status post cholecystectomy. Common bile duct is not dilated. Pancreas: No pancreatic mass. No pancreatic ductal dilatation. No pancreatic or peripancreatic fluid or inflammatory changes. Spleen: Unremarkable. Adrenals/Urinary Tract: 1.4 cm with simple cyst in the lower pole of the right kidney. Left kidney is normal in appearance. No hydroureteronephrosis. Urinary bladder is normal in appearance. Bilateral adrenal glands are normal in appearance. Stomach/Bowel: Normal appearance of the stomach. No pathologic dilatation of small bowel or colon. Normal appendix. Vascular/Lymphatic: Aortic atherosclerosis, without evidence of aneurysm or dissection in the abdominal or pelvic vasculature. No lymphadenopathy noted in the abdomen or pelvis. Reproductive: Status post hysterectomy. Ovaries are not confidently identified and may be surgically absent or atrophic. Other: No significant volume of ascites.  No pneumoperitoneum. Musculoskeletal: Status post right hip arthroplasty. There are no aggressive appearing lytic or blastic lesions noted in the visualized portions of the  skeleton. IMPRESSION: 1. Ill-defined central hepatic mass in the left lobe of the liver estimated to measure approximately 5.5 x 3.3 x 3.6 cm with associated severe intrahepatic biliary ductal dilatation. Findings are highly concerning for cholangiocarcinoma. 2. Aortic atherosclerosis, in addition to at least right coronary artery disease. 3. Additional incidental findings, as above. Electronically Signed   By: Vinnie Langton M.D.   On: 02/01/2018 14:53     Assessment and Plan:   85 year old woman with the following:  1.  Obstructive jaundice related to possible biliary tree tumor: She presented on February 01, 2018 with jaundice and hepatic mass of the left lobe measuring 5.5 x 3.3 x 3.6 cm.  She underwent percutaneous drainage of her biliary tree with follow-up CT scan on February 07, 2018 that failed to demonstrate any masses or lesions.  Breast biopsy did not show any evidence of malignancy. Tumor markers showed CA-19-9 to be elevated at 7571 with AFP to be normal at 6.5.  Her CEA is 4.0 with Ca1 25 at 67.6.  These findings were discussed today with the patient and her family that present with her at bedside.  Differential diagnosis was discussed unlikely represents intrahepatic cholangiocarcinoma and less likely hepatocellular carcinoma.  Benign biliary stricture could also be a possibility at this time despite the elevated tumor markers.  From a management standpoint, I have recommended repeat imaging studies after a short interval of time preferably with a PET scan that could be performed as an outpatient.  Based on these imaging studies, percutaneous biopsy may be needed to confirm malignancy.  If malignancy is confirmed, she would not be a candidate for curative surgical resection rather more palliative radiation therapy with chemotherapy in the future if she desires this approach.  I will arrange a follow-up as an outpatient in addition to PET CT scan upon her discharge.  2.  Jaundice: She  has percutaneous biliary drains in place.  This will likely be replaced periodically and managed by interventional radiology.  3.  Breast cancer: She has a stage II breast cancer in 2015 and treated with surgery and declined any additional therapy.  I see no evidence to suggest this reason presentation to be related to her breast malignancy.  80  minutes was spent with  the patient face-to-face today.  More than 50% of time was dedicated to reviewing imaging studies, laboratory data, discussing differential diagnosis and management options.

## 2018-02-11 NOTE — Progress Notes (Signed)
[place] for Heparin Indication: atrial fibrillation  Allergies  Allergen Reactions  . Methocarbamol Hives  . Vicodin [Hydrocodone-Acetaminophen] Other (See Comments)    Interacts with bp medication and drops blood pressure  . Aspirin Nausea And Vomiting  . Butazolidin [Phenylbutazone] Other (See Comments)    Unknown reaction  . Celebrex [Celecoxib] Nausea And Vomiting  . Codeine Nausea And Vomiting  . Doripenem Rash  . Aspirin Buf(Alhyd-Mghyd-Cacar) Nausea And Vomiting  . Mucinex [Guaifenesin Er] Itching  . Temazepam Other (See Comments)    Unknown reaction   Patient Measurements: Height: 5\' 6"  (167.6 cm) Weight: 248 lb 10.9 oz (112.8 kg) IBW/kg (Calculated) : 59.3 Heparin Dosing Weight: 84.8 kg  Vital Signs: Temp: 98.2 F (36.8 C) (01/09 0501) Temp Source: Oral (01/09 0501) BP: 156/62 (01/09 0501) Pulse Rate: 69 (01/09 0501)  Labs: Recent Labs    02/08/18 1140 02/09/18 0512 02/09/18 1136 02/09/18 2236 02/10/18 0642 02/10/18 0950 02/11/18 0714  HGB  --   --  10.5*  --  10.3*  --   --   HCT  --   --  34.0*  --  34.3*  --   --   PLT  --   --  500*  --  447*  --   --   APTT 91*  --   --  35  --   --   --   LABPROT  --  14.1  --   --   --   --   --   INR  --  1.10  --   --   --   --   --   HEPARINUNFRC  --   --   --   --  0.32 0.31 0.57  CREATININE  --   --  1.04*  --  0.87  --   --     Estimated Creatinine Clearance: 63.5 mL/min (by C-G formula based on SCr of 0.87 mg/dL).   Medical History: Past Medical History:  Diagnosis Date  . Anemia   . Arthritis    "all over" (02/01/2018)  . Asthma   . Atrial fibrillation ([place])    on Xarelto  . Breast cancer, left breast ([place]) 05/19/13   left breast stage IIA   . CHF (congestive heart failure) ([place])   . COPD (chronic obstructive pulmonary disease) ([place])   . GERD (gastroesophageal reflux disease)    [name] Prilosec  . Hypertension   . Macular degeneration   .  Obstructive jaundice 02/01/2018  . On home oxygen therapy    "2.5L prn" (02/01/2018)  . Osteoarthritis    a. 03/28/11 - R Total Hip Arthroplasty  . Pleurisy   . Pneumonia    "2-3 times" (02/01/2018)   Assessment: 85 yo F presents with incidental finding of hepatic mass. On Xarelto PTA for Afib. Holding for biliary drain and liver biopsy. Last dose was 12/29 in the am. Now s/p biliary drain placement and liver biopsy.  Patient now s/p biliary drain revision and brush biopsy. Heparin restarted 1/7. Heparin level this AM 0.57   Goal of Therapy:  Heparin level 0.3-0.7 units/mL APTT 66-102 secs Monitor platelets by anticoagulation protocol: Yes   Plan:  Continue heparin at 1300 units/hr HL in AM Daily HL/APTT F/u resumption of rivaroxaban   Damontay Alred A. Levada Dy, PharmD, [place] Pager: [phone] Please utilize Amion for appropriate phone number to reach the unit pharmacist ([place])   02/11/2018,8:08 AM

## 2018-02-11 NOTE — Clinical Social Work Note (Signed)
CSW advised by MD that she should be ready for discharge on Friday 1/10. Talked with grandson Leanord Asal and their first choice is [facility] if they have a bed. If [facility] is not available,they want [facility]. Call made to [facility], admissions director at [facility] regarding Friday discharge, and they do not have any beds available at this time. Call made to [facility], admissions director at [facility] and they can take patient on Friday. CSW contact [employer] and initiated authorization request. CSW will continue to follow and facilitate discharge to [facility] on Friday, if insurance authorization received.

## 2018-02-11 NOTE — Progress Notes (Signed)
Pt OOB to chair with 2 person assist using steady. Pt tolerated well. 02 at 100% on 3L. Calll bell and personal belongings within reach. Will continue to monitor.

## 2018-02-11 NOTE — Progress Notes (Signed)
Referring Physician(s):  Dr. Alfredia Ferguson  Supervising Physician: Dr. Markus Daft  Patient Status:  [facility] - In-pt  Chief Complaint: Follow up biliary drain placed 12/31 and liver mass.  Subjective:  Patient states she feels pretty good. Tolerating some diet. No specific complaints this am.  Allergies: Methocarbamol; Vicodin [hydrocodone-acetaminophen]; Aspirin; Butazolidin [phenylbutazone]; Celebrex [celecoxib]; Codeine; Doripenem; Aspirin buf(alhyd-mghyd-cacar); Mucinex [guaifenesin er]; and Temazepam  Medications:  Current Facility-Administered Medications:  .  acetaminophen (TYLENOL) tablet 650 mg, 650 mg, Oral, Q6H PRN, 650 mg at 02/09/18 1519 **OR** acetaminophen (TYLENOL) suppository 650 mg, 650 mg, Rectal, Q6H PRN, Karmen Bongo, MD .  albuterol (PROVENTIL) (2.5 MG/3ML) 0.083% nebulizer solution 3 mL, 3 mL, Inhalation, Q6H PRN, Karmen Bongo, MD, 3 mL at 02/07/18 1147 .  amLODipine (NORVASC) tablet 10 mg, 10 mg, Oral, Daily, Karmen Bongo, MD, 10 mg at 02/10/18 1013 .  feeding supplement (ENSURE ENLIVE) (ENSURE ENLIVE) liquid 237 mL, 237 mL, Oral, TID BM, Hongalgi, Anand D, MD, 237 mL at 02/10/18 2000 .  heparin ADULT infusion 100 units/mL (25000 units/230m sodium chloride 0.45%), 1,300 Units/hr, Intravenous, Continuous, Pierce, Dwayne A, RPH, Last Rate: 13 mL/hr at 02/10/18 1150, 1,300 Units/hr at 02/10/18 1150 .  mometasone-formoterol (DULERA) 200-5 MCG/ACT inhaler 2 puff, 2 puff, Inhalation, BID, YKarmen Bongo MD, 2 puff at 02/11/18 0756 .  morphine 2 MG/ML injection 2 mg, 2 mg, Intravenous, Q2H PRN, Sheikh, Omair Latif, DO .  multivitamin with minerals tablet 1 tablet, 1 tablet, Oral, Daily, Hongalgi, Anand D, MD, 1 tablet at 02/10/18 1349 .  nebivolol (BYSTOLIC) tablet 20 mg, 20 mg, Oral, Daily, YKarmen Bongo MD, 20 mg at 02/10/18 1013 .  ondansetron (ZOFRAN) tablet 4 mg, 4 mg, Oral, Q6H PRN, 4 mg at 02/02/18 1358 **OR** ondansetron (ZOFRAN) injection 4 mg, 4 mg,  Intravenous, Q6H PRN, YKarmen Bongo MD, 4 mg at 02/10/18 1013 .  polyethylene glycol (MIRALAX / GLYCOLAX) packet 17 g, 17 g, Oral, BID, SRaiford NobleL[place] DO, 17 g at 02/10/18 2255 .  promethazine (PHENERGAN) injection 12.5 mg, 12.5 mg, Intravenous, Q6H PRN, SRaiford NobleLatif, DO, 12.5 mg at 02/07/18 1147 .  senna-docusate (Senokot-S) tablet 1 tablet, 1 tablet, Oral, BID, [place] 1 tablet at 02/10/18 2255 .  sodium chloride flush (NS) 0.9 % injection 5 mL, 5 mL, Intracatheter, Q8H, Wagner, Jaime, DO, 5 mL at 02/10/18 2256 .  traMADol (ULTRAM) tablet 50 mg, 50 mg, Oral, BID, YKarmen Bongo MD, 50 mg at 02/10/18 2255    Vital Signs: BP (!) 156/62 (BP Location: Right Arm)   Pulse 69   Temp 98.2 F (36.8 C) (Oral)   Resp 20   Ht _0  (1.676 m)   Wt 112.8 kg   SpO2 99%   BMI 40.14 kg/m   Physical Exam Vitals signs and nursing note reviewed.  Constitutional:      General: She is not in acute distress. HENT:     Head: Normocephalic and atraumatic.  Cardiovascular:     Rate and Rhythm: Normal rate and regular rhythm.  Pulmonary:     Effort: Pulmonary effort is normal.     Breath sounds: Normal breath sounds.  Abdominal:     Tenderness: There is no abdominal tenderness.     Comments: RUQ drain intact, connected to gravity bag. Site clean, dry. Bilious output  Skin:    General: Skin is warm and dry.  Neurological:     Mental Status: She is alert. Mental status is at baseline.  Psychiatric:        Mood and Affect: Mood normal.        Behavior: Behavior normal.        Thought Content: Thought content normal.        Judgment: Judgment normal.     Imaging: Ct Angio Chest Pe W Or Wo Contrast  Result Date: 02/07/2018 CLINICAL DATA:  Dyspnea, wheezing, history asthma, CHF, worsened symptoms since yesterday, hyperventilation, abdominal distension; history of biliary dilatation and central biliary obstruction post percutaneous drainage EXAM: CT ANGIOGRAPHY  CHEST CT ABDOMEN AND PELVIS WITH CONTRAST TECHNIQUE: Multidetector CT imaging of the chest was performed using the standard protocol during bolus administration of intravenous contrast. Multiplanar CT image reconstructions and MIPs were obtained to evaluate the vascular anatomy. Multidetector CT imaging of the abdomen and pelvis was performed using the standard protocol during bolus administration of intravenous contrast. CONTRAST:  149m ISOVUE-370 IOPAMIDOL (ISOVUE-370) INJECTION 76% IV. COMPARISON:  CT abdomen and pelvis 02/01/2018 FINDINGS: CTA CHEST FINDINGS Cardiovascular: Atherosclerotic calcifications of aorta, proximal great vessels and coronary arteries. Cardiac chambers appear mildly enlarged. No pericardial effusion. Pulmonary arteries adequately opacified and patent. No evidence of pulmonary embolism. Mediastinum/Nodes: Base of cervical region normal appearance. No thoracic adenopathy. Esophagus unremarkable. Lungs/Pleura: Small focus of infiltrate or atelectasis at anterior LEFT upper lobe image 51. Multiple tiny BILATERAL lung nodules, noncalcified. 7 mm LEFT lower lobe nodule image 50. Remaining lungs clear. No infiltrate, pleural effusion or pneumothorax Musculoskeletal: Osseous demineralization. Prior cervical spine fusion. No definite bone lesions. Review of the MIP images confirms the above findings. CT ABDOMEN and PELVIS FINDINGS Hepatobiliary: Percutaneous biliary drain decompression of the RIGHT biliary system since previous exam. Persistent dilatation of the intrahepatic biliary radicles of the LEFT lobe. No focal hepatic mass lesion or visualized obstructing mass at the central biliary tree. Gallbladder surgically absent. Pancreas: Normal appearance Spleen: Normal appearance Adrenals/Urinary Tract: Adrenal glands normal appearance. Tiny cyst at inferior pole RIGHT kidney. Kidneys, ureters, and bladder otherwise unremarkable. Stomach/Bowel: Appendix surgically absent by history. Dense  retained contrast in transverse colon. Stomach decompressed. Stomach and bowel loops otherwise unremarkable. Vascular/Lymphatic: Atherosclerotic calcifications of aorta and iliac arteries. Aorta normal caliber. No adenopathy. Reproductive: Uterus surgically absent with nonvisualization of ovaries Other: No free air or free fluid. No hernia or acute inflammatory process. Musculoskeletal: No acute osseous lesions. RIGHT hip prosthesis noted. Degenerative disc and facet disease changes lumbar spine. Review of the MIP images confirms the above findings. IMPRESSION: No evidence of pulmonary embolism. Multiple small BILATERAL noncalcified pulmonary nodules measuring up to 7 mm in largest diameter in LEFT lobe, nonspecific but pulmonary metastases are not excluded. Persistent intrahepatic biliary dilatation involving the LEFT lobe of the liver with decreased biliary dilatation of the RIGHT lobe biliary tree post percutaneous drainage. Scattered atherosclerotic calcifications including within coronary arteries. Electronically Signed   By: MLavonia DanaM.D.   On: 02/07/2018 17:33   Ct Abdomen Pelvis W Contrast  Result Date: 02/07/2018 CLINICAL DATA:  Dyspnea, wheezing, history asthma, CHF, worsened symptoms since yesterday, hyperventilation, abdominal distension; history of biliary dilatation and central biliary obstruction post percutaneous drainage EXAM: CT ANGIOGRAPHY CHEST CT ABDOMEN AND PELVIS WITH CONTRAST TECHNIQUE: Multidetector CT imaging of the chest was performed using the standard protocol during bolus administration of intravenous contrast. Multiplanar CT image reconstructions and MIPs were obtained to evaluate the vascular anatomy. Multidetector CT imaging of the abdomen and pelvis was performed using the standard protocol during bolus administration of intravenous contrast. CONTRAST:  1064mISOVUE-370  IOPAMIDOL (ISOVUE-370) INJECTION 76% IV. COMPARISON:  CT abdomen and pelvis 02/01/2018 FINDINGS: CTA CHEST  FINDINGS Cardiovascular: Atherosclerotic calcifications of aorta, proximal great vessels and coronary arteries. Cardiac chambers appear mildly enlarged. No pericardial effusion. Pulmonary arteries adequately opacified and patent. No evidence of pulmonary embolism. Mediastinum/Nodes: Base of cervical region normal appearance. No thoracic adenopathy. Esophagus unremarkable. Lungs/Pleura: Small focus of infiltrate or atelectasis at anterior LEFT upper lobe image 51. Multiple tiny BILATERAL lung nodules, noncalcified. 7 mm LEFT lower lobe nodule image 50. Remaining lungs clear. No infiltrate, pleural effusion or pneumothorax Musculoskeletal: Osseous demineralization. Prior cervical spine fusion. No definite bone lesions. Review of the MIP images confirms the above findings. CT ABDOMEN and PELVIS FINDINGS Hepatobiliary: Percutaneous biliary drain decompression of the RIGHT biliary system since previous exam. Persistent dilatation of the intrahepatic biliary radicles of the LEFT lobe. No focal hepatic mass lesion or visualized obstructing mass at the central biliary tree. Gallbladder surgically absent. Pancreas: Normal appearance Spleen: Normal appearance Adrenals/Urinary Tract: Adrenal glands normal appearance. Tiny cyst at inferior pole RIGHT kidney. Kidneys, ureters, and bladder otherwise unremarkable. Stomach/Bowel: Appendix surgically absent by history. Dense retained contrast in transverse colon. Stomach decompressed. Stomach and bowel loops otherwise unremarkable. Vascular/Lymphatic: Atherosclerotic calcifications of aorta and iliac arteries. Aorta normal caliber. No adenopathy. Reproductive: Uterus surgically absent with nonvisualization of ovaries Other: No free air or free fluid. No hernia or acute inflammatory process. Musculoskeletal: No acute osseous lesions. RIGHT hip prosthesis noted. Degenerative disc and facet disease changes lumbar spine. Review of the MIP images confirms the above findings. IMPRESSION:  No evidence of pulmonary embolism. Multiple small BILATERAL noncalcified pulmonary nodules measuring up to 7 mm in largest diameter in LEFT lobe, nonspecific but pulmonary metastases are not excluded. Persistent intrahepatic biliary dilatation involving the LEFT lobe of the liver with decreased biliary dilatation of the RIGHT lobe biliary tree post percutaneous drainage. Scattered atherosclerotic calcifications including within coronary arteries. Electronically Signed   By: Lavonia Dana M.D.   On: 02/07/2018 17:33   Dg Chest Port 1 View  Result Date: 02/08/2018 CLINICAL DATA:  85 year old female with shortness breath for the past week. Subsequent encounter. EXAM: PORTABLE CHEST 1 VIEW COMPARISON:  02/07/2018 chest CT and chest x-ray. FINDINGS: Mild cardiomegaly. Central pulmonary vascular prominence. No segmental consolidation or pneumothorax. CT detected pulmonary nodules not appreciated on present plain film exam. Calcified aorta. Calcified left carotid bifurcation. Bilateral acromioclavicular joint degenerative changes. Prior surgery lower cervical spine. IMPRESSION: 1. Mild cardiomegaly. 2. Mild central pulmonary vascular prominence. 3. Blunting left costophrenic angle similar to prior exams and may be related to prominent epicardial fat pad/subsegmental atelectasis as seen on CT. 4. CT detected pulmonary nodules not appreciated on present plain film exam. Electronically Signed   By: Genia Del M.D.   On: 02/08/2018 07:05   Dg Chest Port 1 View  Result Date: 02/07/2018 CLINICAL DATA:  Shortness of breath with altered mental status. EXAM: PORTABLE CHEST 1 VIEW COMPARISON:  02/09/2015 FINDINGS: Lungs are adequately inflated without focal airspace consolidation or effusion. Cardiomediastinal silhouette and remainder of the exam is unchanged. IMPRESSION: No active disease. Electronically Signed   By: Marin Olp M.D.   On: 02/07/2018 12:37   Ir Exchange Biliary Drain  Result Date: 02/09/2018 INDICATION:  85 year old female with a history of central obstructing mass in the liver with biliary ductal dilatation and jaundice, initially treated with right-sided PTC and drainage 02/02/2018. She presents today for repositioning of the drain which has been partially withdrawn and a brush  biopsy EXAM: ULTRASOUND-GUIDED THROUGH THE TUBE CHOLANGIOGRAM AND EXCHANGE OF WITHDRAWN BILIARY TUBE PLACEMENT OF NEW 12 FRENCH DRAIN ENDOLUMINAL BRUSH BIOPSY MEDICATIONS: None ANESTHESIA/SEDATION: Moderate (conscious) sedation was employed during this procedure. A total of Versed 2.5 mg and Fentanyl 125 mcg was administered intravenously. Moderate Sedation Time: 25 minutes. The patient's level of consciousness and vital signs were monitored continuously by radiology nursing throughout the procedure under my direct supervision. FLUOROSCOPY TIME:  Fluoroscopy Time: 6 minutes 18 seconds (192 mGy). COMPLICATIONS: None PROCEDURE: Informed written consent was obtained from the patient after a thorough discussion of the procedural risks, benefits and alternatives. All questions were addressed. Maximal Sterile Barrier Technique was utilized including caps, mask, sterile gowns, sterile gloves, sterile drape, hand hygiene and skin antiseptic. A timeout was performed prior to the initiation of the procedure. Patient positioned supine position on the fluoroscopy table. Patient is prepped and draped in the usual sterile fashion. Scout images were acquired of the upper abdomen. Through the tube cholangiogram was performed demonstrating withdrawal of the previously existing 10 [place] biliary drain. A Glidewire was navigated into the duodenum through the existing tract. Drain was removed from the [employer]. A short Kumpe the catheter was then navigated into the duodenum. Rose in wire was placed through the Kumpe the catheter and the Kumpe the catheter was withdrawn. A 7 French 35 cm bright tip sheath was then placed over the Rose an wire into the biliary  system. Introducer was withdrawn and a pull-back cholangiogram was performed. The targeted narrowing was just cephalad of the clips in the liver hilum, at a region of ductal narrowing in the presumed area of tumor. Introducer was placed and the sheath was then again pushed beyond the occlusion. Coaxial cytology brush biopsy kit was advanced through the sheath, parallel to the safety wire. The sheath was then withdrawn and the brush biopsy kit was withdrawn into the region of narrowing with agitation across the lesion. Brush was then withdrawn placed into saline comp for cytology. This biopsy was then repeated for a total of 2 brush biopsies. The sheath was then withdrawn and we attempted to place a 12 [place] biliary drain over the Rose an wire. The tract required 12 French dilation. The 12 French drain was again attempted to place over the [facility] an wire, unsuccessful given the angle of approach through the abdominal wall. Kumpe the catheter was replaced to the duodenum and the Rose an wire was exchanged for stiff Amplatz 160 cm wire. We were then successful with placing the 12 [place] biliary drain over the wire into the duodenum, with the proximal side holes positioned just proximal to the presumed site of occlusion/tumor. Drain was sutured in position.  Final images were stored. Gravity drain was attached. Patient tolerated the procedure well and remained hemodynamically stable throughout. No complications were encountered and no significant blood loss. IMPRESSION: Status post routine exchange and up sizing of PTC/drainage with a new 12 French drain placed. 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 Signed, Dulcy Fanny. Dellia Nims, RPVI [facility] Radiology Electronically Signed   By: Corrie Mckusick D.O.   On: 02/09/2018 10:41   Ir Endoluminal Bx Of Biliary Tree  Result Date: 02/09/2018 INDICATION: 85 year old female  with a history of central obstructing mass in the liver with biliary ductal dilatation and jaundice, initially treated with right-sided PTC and drainage 02/02/2018. She presents today for repositioning of the drain which has been partially withdrawn and  a brush biopsy EXAM: ULTRASOUND-GUIDED THROUGH THE TUBE CHOLANGIOGRAM AND EXCHANGE OF WITHDRAWN BILIARY TUBE PLACEMENT OF NEW 12 FRENCH DRAIN ENDOLUMINAL BRUSH BIOPSY MEDICATIONS: None ANESTHESIA/SEDATION: Moderate (conscious) sedation was employed during this procedure. A total of Versed 2.5 mg and Fentanyl 125 mcg was administered intravenously. Moderate Sedation Time: 25 minutes. The patient's level of consciousness and vital signs were monitored continuously by radiology nursing throughout the procedure under my direct supervision. FLUOROSCOPY TIME:  Fluoroscopy Time: 6 minutes 18 seconds (192 mGy). COMPLICATIONS: None PROCEDURE: Informed written consent was obtained from the patient after a thorough discussion of the procedural risks, benefits and alternatives. All questions were addressed. Maximal Sterile Barrier Technique was utilized including caps, mask, sterile gowns, sterile gloves, sterile drape, hand hygiene and skin antiseptic. A timeout was performed prior to the initiation of the procedure. Patient positioned supine position on the fluoroscopy table. Patient is prepped and draped in the usual sterile fashion. Scout images were acquired of the upper abdomen. Through the tube cholangiogram was performed demonstrating withdrawal of the previously existing 10 [place] biliary drain. A Glidewire was navigated into the duodenum through the existing tract. Drain was removed from the [employer]. A short Kumpe the catheter was then navigated into the duodenum. Rose in wire was placed through the Kumpe the catheter and the Kumpe the catheter was withdrawn. A 7 French 35 cm bright tip sheath was then placed over the Rose an wire into the biliary system. Introducer  was withdrawn and a pull-back cholangiogram was performed. The targeted narrowing was just cephalad of the clips in the liver hilum, at a region of ductal narrowing in the presumed area of tumor. Introducer was placed and the sheath was then again pushed beyond the occlusion. Coaxial cytology brush biopsy kit was advanced through the sheath, parallel to the safety wire. The sheath was then withdrawn and the brush biopsy kit was withdrawn into the region of narrowing with agitation across the lesion. Brush was then withdrawn placed into saline comp for cytology. This biopsy was then repeated for a total of 2 brush biopsies. The sheath was then withdrawn and we attempted to place a 12 [place] biliary drain over the Rose an wire. The tract required 12 French dilation. The 12 French drain was again attempted to place over the [facility] an wire, unsuccessful given the angle of approach through the abdominal wall. Kumpe the catheter was replaced to the duodenum and the Rose an wire was exchanged for stiff Amplatz 160 cm wire. We were then successful with placing the 12 [place] biliary drain over the wire into the duodenum, with the proximal side holes positioned just proximal to the presumed site of occlusion/tumor. Drain was sutured in position.  Final images were stored. Gravity drain was attached. Patient tolerated the procedure well and remained hemodynamically stable throughout. No complications were encountered and no significant blood loss. IMPRESSION: Status post routine exchange and up sizing of PTC/drainage with a new 12 French drain placed. 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 Signed, Dulcy Fanny. Dellia Nims, RPVI [facility] Radiology Electronically Signed   By: Corrie Mckusick D.O.   On: 02/09/2018 10:41    Labs:  CBC: Recent Labs    02/07/18 0359 02/08/18 0531 02/09/18 1136 02/10/18 0642  WBC 11.1*  25.0* 17.0* 12.1*  HGB 10.4* 10.2* 10.5* 10.3*  HCT 33.6* 34.3* 34.0* 34.3*  PLT 471* 438* 500* 447*    COAGS: Recent Labs  02/01/18 1712 02/03/18 0512 02/08/18 0531 02/08/18 1140 02/09/18 0512 02/09/18 2236  INR 1.16  --   --   --  1.10  --   APTT  --  60* 84* 91*  --  35    BMP: Recent Labs    02/07/18 0359 02/08/18 0531 02/09/18 1136 02/10/18 0642  NA 136 134* 138 138  K 4.7 4.6 4.1 4.3  CL 100 100 101 101  CO2 27 20* 27 27  GLUCOSE 92 154* 111* 82  BUN 22 32* 40* 37*  CALCIUM 8.8* 8.5* 8.8* 8.8*  CREATININE 0.90 1.23* 1.04* 0.87  GFRNONAA 60* 41* 50* >60  GFRAA >60 47* 58* >60    LIVER FUNCTION TESTS: Recent Labs    02/08/18 0531 02/09/18 1136 02/10/18 0642 02/11/18 0714  BILITOT 3.2* 2.7* 2.5* 2.4*  AST 145* 72* 78* 60*  ALT 245* 179* 171* 158*  ALKPHOS 496* 424* 358* 347*  PROT 6.4* 6.6 6.1* 6.3*  ALBUMIN 2.3* 2.3* 2.2* 2.2*    Assessment and Plan:  Patient s/p biliary drain placement 12/31 by Dr. Anselm Pancoast S/p exchange and brush biopsy 1/7 Drain functioning well. T Bili down to 2.4 Cytology from brushings showed no malignant cells. OK to resume Xarelto from IR standpoint.    Electronically Signed: Ascencion Dike, PA-C 02/11/2018, 9:35 AM   I spent a total of 25 Minutes at the the patient's bedside AND on the patient's hospital floor or unit, greater than 50% of which was counseling/coordinating care for biliary drain follow up/brush biopsy consult.

## 2018-02-12 ENCOUNTER — Inpatient Hospital Stay (HOSPITAL_COMMUNITY): Payer: PPO

## 2018-02-12 ENCOUNTER — Telehealth: Payer: Self-pay | Admitting: Oncology

## 2018-02-12 DIAGNOSIS — Z96641 Presence of right artificial hip joint: Secondary | ICD-10-CM | POA: Diagnosis not present

## 2018-02-12 DIAGNOSIS — N178 Other acute kidney failure: Secondary | ICD-10-CM | POA: Diagnosis not present

## 2018-02-12 DIAGNOSIS — I130 Hypertensive heart and chronic kidney disease with heart failure and stage 1 through stage 4 chronic kidney disease, or unspecified chronic kidney disease: Secondary | ICD-10-CM | POA: Diagnosis not present

## 2018-02-12 DIAGNOSIS — W010XXA Fall on same level from slipping, tripping and stumbling without subsequent striking against object, initial encounter: Secondary | ICD-10-CM | POA: Diagnosis not present

## 2018-02-12 DIAGNOSIS — R0902 Hypoxemia: Secondary | ICD-10-CM | POA: Diagnosis not present

## 2018-02-12 DIAGNOSIS — I10 Essential (primary) hypertension: Secondary | ICD-10-CM | POA: Diagnosis not present

## 2018-02-12 DIAGNOSIS — I447 Left bundle-branch block, unspecified: Secondary | ICD-10-CM | POA: Diagnosis not present

## 2018-02-12 DIAGNOSIS — I4891 Unspecified atrial fibrillation: Secondary | ICD-10-CM | POA: Diagnosis not present

## 2018-02-12 DIAGNOSIS — Z23 Encounter for immunization: Secondary | ICD-10-CM | POA: Diagnosis not present

## 2018-02-12 DIAGNOSIS — C50412 Malignant neoplasm of upper-outer quadrant of left female breast: Secondary | ICD-10-CM | POA: Diagnosis not present

## 2018-02-12 DIAGNOSIS — E875 Hyperkalemia: Secondary | ICD-10-CM | POA: Diagnosis not present

## 2018-02-12 DIAGNOSIS — I480 Paroxysmal atrial fibrillation: Secondary | ICD-10-CM | POA: Diagnosis not present

## 2018-02-12 DIAGNOSIS — R112 Nausea with vomiting, unspecified: Secondary | ICD-10-CM | POA: Diagnosis not present

## 2018-02-12 DIAGNOSIS — Y92012 Bathroom of single-family (private) house as the place of occurrence of the external cause: Secondary | ICD-10-CM | POA: Diagnosis not present

## 2018-02-12 DIAGNOSIS — R279 Unspecified lack of coordination: Secondary | ICD-10-CM | POA: Diagnosis not present

## 2018-02-12 DIAGNOSIS — I5032 Chronic diastolic (congestive) heart failure: Secondary | ICD-10-CM | POA: Diagnosis not present

## 2018-02-12 DIAGNOSIS — Z66 Do not resuscitate: Secondary | ICD-10-CM | POA: Diagnosis not present

## 2018-02-12 DIAGNOSIS — I5033 Acute on chronic diastolic (congestive) heart failure: Secondary | ICD-10-CM | POA: Diagnosis not present

## 2018-02-12 DIAGNOSIS — N179 Acute kidney failure, unspecified: Secondary | ICD-10-CM | POA: Diagnosis not present

## 2018-02-12 DIAGNOSIS — R339 Retention of urine, unspecified: Secondary | ICD-10-CM | POA: Diagnosis not present

## 2018-02-12 DIAGNOSIS — E871 Hypo-osmolality and hyponatremia: Secondary | ICD-10-CM | POA: Diagnosis not present

## 2018-02-12 DIAGNOSIS — N19 Unspecified kidney failure: Secondary | ICD-10-CM | POA: Diagnosis not present

## 2018-02-12 DIAGNOSIS — R944 Abnormal results of kidney function studies: Secondary | ICD-10-CM | POA: Diagnosis present

## 2018-02-12 DIAGNOSIS — H3530 Unspecified macular degeneration: Secondary | ICD-10-CM | POA: Diagnosis not present

## 2018-02-12 DIAGNOSIS — C221 Intrahepatic bile duct carcinoma: Secondary | ICD-10-CM | POA: Diagnosis not present

## 2018-02-12 DIAGNOSIS — D72829 Elevated white blood cell count, unspecified: Secondary | ICD-10-CM | POA: Diagnosis not present

## 2018-02-12 DIAGNOSIS — R2689 Other abnormalities of gait and mobility: Secondary | ICD-10-CM | POA: Diagnosis not present

## 2018-02-12 DIAGNOSIS — S63250D Unspecified dislocation of right index finger, subsequent encounter: Secondary | ICD-10-CM | POA: Diagnosis not present

## 2018-02-12 DIAGNOSIS — K7689 Other specified diseases of liver: Secondary | ICD-10-CM | POA: Diagnosis not present

## 2018-02-12 DIAGNOSIS — K831 Obstruction of bile duct: Secondary | ICD-10-CM | POA: Diagnosis not present

## 2018-02-12 DIAGNOSIS — R278 Other lack of coordination: Secondary | ICD-10-CM | POA: Diagnosis not present

## 2018-02-12 DIAGNOSIS — I110 Hypertensive heart disease with heart failure: Secondary | ICD-10-CM | POA: Diagnosis not present

## 2018-02-12 DIAGNOSIS — K8689 Other specified diseases of pancreas: Secondary | ICD-10-CM | POA: Diagnosis not present

## 2018-02-12 DIAGNOSIS — E876 Hypokalemia: Secondary | ICD-10-CM | POA: Diagnosis not present

## 2018-02-12 DIAGNOSIS — R110 Nausea: Secondary | ICD-10-CM | POA: Diagnosis not present

## 2018-02-12 DIAGNOSIS — Z743 Need for continuous supervision: Secondary | ICD-10-CM | POA: Diagnosis not present

## 2018-02-12 DIAGNOSIS — R627 Adult failure to thrive: Secondary | ICD-10-CM | POA: Diagnosis not present

## 2018-02-12 DIAGNOSIS — Y9301 Activity, walking, marching and hiking: Secondary | ICD-10-CM | POA: Diagnosis not present

## 2018-02-12 DIAGNOSIS — E872 Acidosis: Secondary | ICD-10-CM | POA: Diagnosis not present

## 2018-02-12 DIAGNOSIS — I482 Chronic atrial fibrillation, unspecified: Secondary | ICD-10-CM | POA: Diagnosis not present

## 2018-02-12 DIAGNOSIS — N170 Acute kidney failure with tubular necrosis: Secondary | ICD-10-CM | POA: Diagnosis not present

## 2018-02-12 DIAGNOSIS — I959 Hypotension, unspecified: Secondary | ICD-10-CM | POA: Diagnosis not present

## 2018-02-12 DIAGNOSIS — Z6839 Body mass index (BMI) 39.0-39.9, adult: Secondary | ICD-10-CM | POA: Diagnosis not present

## 2018-02-12 DIAGNOSIS — R296 Repeated falls: Secondary | ICD-10-CM | POA: Diagnosis not present

## 2018-02-12 DIAGNOSIS — R198 Other specified symptoms and signs involving the digestive system and abdomen: Secondary | ICD-10-CM | POA: Diagnosis not present

## 2018-02-12 DIAGNOSIS — R160 Hepatomegaly, not elsewhere classified: Secondary | ICD-10-CM | POA: Diagnosis not present

## 2018-02-12 DIAGNOSIS — E861 Hypovolemia: Secondary | ICD-10-CM | POA: Diagnosis not present

## 2018-02-12 DIAGNOSIS — R945 Abnormal results of liver function studies: Secondary | ICD-10-CM | POA: Diagnosis not present

## 2018-02-12 DIAGNOSIS — Z9981 Dependence on supplemental oxygen: Secondary | ICD-10-CM | POA: Diagnosis not present

## 2018-02-12 DIAGNOSIS — J449 Chronic obstructive pulmonary disease, unspecified: Secondary | ICD-10-CM | POA: Diagnosis not present

## 2018-02-12 DIAGNOSIS — N183 Chronic kidney disease, stage 3 (moderate): Secondary | ICD-10-CM | POA: Diagnosis not present

## 2018-02-12 DIAGNOSIS — K219 Gastro-esophageal reflux disease without esophagitis: Secondary | ICD-10-CM | POA: Diagnosis not present

## 2018-02-12 LAB — HEPATIC FUNCTION PANEL
ALT: 151 U/L — ABNORMAL HIGH (ref 0–44)
AST: 59 U/L — ABNORMAL HIGH (ref 15–41)
Albumin: 2.6 g/dL — ABNORMAL LOW (ref 3.5–5.0)
Alkaline Phosphatase: 340 U/L — ABNORMAL HIGH (ref 38–126)
BILIRUBIN DIRECT: 1.1 mg/dL — AB (ref 0.0–0.2)
BILIRUBIN INDIRECT: 0.9 mg/dL (ref 0.3–0.9)
Total Bilirubin: 2 mg/dL — ABNORMAL HIGH (ref 0.3–1.2)
Total Protein: 6.7 g/dL (ref 6.5–8.1)

## 2018-02-12 LAB — CULTURE, BLOOD (ROUTINE X 2)
Culture: NO GROWTH
Culture: NO GROWTH
Special Requests: ADEQUATE

## 2018-02-12 LAB — HEPARIN LEVEL (UNFRACTIONATED): Heparin Unfractionated: 0.42 IU/mL (ref 0.30–0.70)

## 2018-02-12 MED ORDER — ENSURE ENLIVE PO LIQD: 237.0000 mL | Freq: Three times a day (TID) | ORAL | Status: DC

## 2018-02-12 MED ORDER — SENNOSIDES-DOCUSATE SODIUM 8.6-50 MG PO TABS: 1.0000 | ORAL_TABLET | Freq: Two times a day (BID) | ORAL | Status: AC

## 2018-02-12 MED ORDER — GADOBUTROL 1 MMOL/ML IV SOLN
10.0000 mL | Freq: Once | INTRAVENOUS | Status: AC | PRN
  Administered 2018-02-12: 10 mL via INTRAVENOUS

## 2018-02-12 MED ORDER — TRAMADOL HCL 50 MG PO TABS: 50.0000 mg | ORAL_TABLET | Freq: Two times a day (BID) | ORAL | 0 refills | Status: DC

## 2018-02-12 MED ORDER — POLYETHYLENE GLYCOL 3350 17 G PO PACK: 17.0000 g | PACK | Freq: Two times a day (BID) | ORAL | Status: AC

## 2018-02-12 MED ORDER — ONDANSETRON HCL 4 MG PO TABS: 4.0000 mg | ORAL_TABLET | Freq: Three times a day (TID) | ORAL | Status: AC | PRN

## 2018-02-12 MED ORDER — APIXABAN 5 MG PO TABS: 5.0000 mg | ORAL_TABLET | Freq: Two times a day (BID) | ORAL | Status: DC

## 2018-02-12 MED ORDER — RIVAROXABAN 20 MG PO TABS
20.0000 mg | ORAL_TABLET | Freq: Every day | ORAL | Status: DC
  Administered 2018-02-12: 20 mg via ORAL
  Filled 2018-02-12 (×2): qty 1

## 2018-02-12 NOTE — Discharge Instructions (Signed)
Information on my medicine - XARELTO® (Rivaroxaban) ° °Why was Xarelto® prescribed for you? °Xarelto® was prescribed for you to reduce the risk of a blood clot forming that can cause a stroke if you have a medical condition called atrial fibrillation (a type of irregular heartbeat). ° °What do you need to know about xarelto® ? °Take your Xarelto® ONCE DAILY at the same time every day with your evening meal. °If you have difficulty swallowing the tablet whole, you may crush it and mix in applesauce just prior to taking your dose. ° °Take Xarelto® exactly as prescribed by your doctor and DO NOT stop taking Xarelto® without talking to the doctor who prescribed the medication.  Stopping without other stroke prevention medication to take the place of Xarelto® may increase your risk of developing a clot that causes a stroke.  Refill your prescription before you run out. ° °After discharge, you should have regular check-up appointments with your healthcare provider that is prescribing your Xarelto®.  In the future your dose may need to be changed if your kidney function or weight changes by a significant amount. ° °What do you do if you miss a dose? °If you are taking Xarelto® ONCE DAILY and you miss a dose, take it as soon as you remember on the same day then continue your regularly scheduled once daily regimen the next day. Do not take two doses of Xarelto® at the same time or on the same day.  ° °Important Safety Information °A possible side effect of Xarelto® is bleeding. You should call your healthcare provider right away if you experience any of the following: °? Bleeding from an injury or your nose that does not stop. °? Unusual colored urine (red or dark brown) or unusual colored stools (red or black). °? Unusual bruising for unknown reasons. °? A serious fall or if you hit your head (even if there is no bleeding). ° °Some medicines may interact with Xarelto® and might increase your risk of bleeding while on  Xarelto®. To help avoid this, consult your healthcare provider or pharmacist prior to using any new prescription or non-prescription medications, including herbals, vitamins, non-steroidal anti-inflammatory drugs (NSAIDs) and supplements. ° °This website has more information on Xarelto®: [email or web address].com. ° ° ° °Additional discharge instructions ° °Please get your medications reviewed and adjusted by your Primary MD. ° °Please request your Primary MD to go over all Hospital Tests and Procedure/Radiological results at the follow up, please get all Hospital records sent to your Prim MD by signing hospital release before you go home. ° °If you had Pneumonia of Lung problems at the Hospital: °Please get a 2 view Chest X ray done in 6-8 weeks after hospital discharge or sooner if instructed by your Primary MD. ° °If you have Congestive Heart Failure: °Please call your Cardiologist or Primary MD anytime you have any of the following symptoms:  °1) 3 pound weight gain in 24 hours or 5 pounds in 1 week  °2) shortness of breath, with or without a dry hacking cough  °3) swelling in the hands, feet or stomach  °4) if you have to sleep on extra pillows at night in order to breathe ° °Follow cardiac low salt diet and 1.5 lit/day fluid restriction. ° °If you have diabetes °Accuchecks 4 times/day, Once in AM empty stomach and then before each meal. °Log in all results and show them to your primary doctor at your next visit. °If any glucose reading is under 80 or above 300   call your primary MD immediately. ° °If you have Seizure/Convulsions/Epilepsy: °Please do not drive, operate heavy machinery, participate in activities at heights or participate in high speed sports until you have seen by Primary MD or a Neurologist and advised to do so again. ° °If you had Gastrointestinal Bleeding: °Please ask your Primary MD to check a complete blood count within one week of discharge or at your next visit. Your endoscopic/colonoscopic biopsies  that are pending at the time of discharge, will also need to followed by your Primary MD. ° °Get Medicines reviewed and adjusted. °Please take all your medications with you for your next visit with your Primary MD ° °Please request your Primary MD to go over all hospital tests and procedure/radiological results at the follow up, please ask your Primary MD to get all Hospital records sent to his/her office. ° °If you experience worsening of your admission symptoms, develop shortness of breath, life threatening emergency, suicidal or homicidal thoughts you must seek medical attention immediately by calling 911 or calling your MD immediately  if symptoms less severe. ° °You must read complete instructions/literature along with all the possible adverse reactions/side effects for all the Medicines you take and that have been prescribed to you. Take any new Medicines after you have completely understood and accpet all the possible adverse reactions/side effects.  ° °Do not drive or operate heavy machinery when taking Pain medications.  ° °Do not take more than prescribed Pain, Sleep and Anxiety Medications ° °Special Instructions: If you have smoked or chewed Tobacco  in the last 2 yrs please stop smoking, stop any regular Alcohol  and or any Recreational drug use. ° °Wear Seat belts while driving. ° °Please note °You were cared for by a hospitalist during your hospital stay. If you have any questions about your discharge medications or the care you received while you were in the hospital after you are discharged, you can call the unit and asked to speak with the hospitalist on call if the hospitalist that took care of you is not available. Once you are discharged, your primary care physician will handle any further medical issues. Please note that NO REFILLS for any discharge medications will be authorized once you are discharged, as it is imperative that you return to your primary care physician (or establish a  relationship with a primary care physician if you do not have one) for your aftercare needs so that they can reassess your need for medications and monitor your lab values. ° °You can reach the hospitalist office at phone [phone] or fax [phone] °  °If you do not have a primary care physician, you can call [phone] for a physician referral. ° ° °

## 2018-02-12 NOTE — Progress Notes (Signed)
ANTICOAGULATION CONSULT NOTE   Pharmacy Consult for Heparin>>xarelto Indication: atrial fibrillation  Patient Measurements: Height: 5\' 6"  (167.6 cm) Weight: 239 lb 10.2 oz (108.7 kg) IBW/kg (Calculated) : 59.3 Heparin Dosing Weight: 84.8 kg  Vital Signs: Temp: 97.8 F (36.6 C) (01/10 0539) Temp Source: Oral (01/10 0539) BP: 148/61 (01/10 0539) Pulse Rate: 71 (01/10 0539)  Labs: Recent Labs    02/09/18 2236  02/10/18 9311 02/10/18 0950 02/11/18 0714 02/12/18 0628  HGB  --   --  10.3*  --   --   --   HCT  --   --  34.3*  --   --   --   PLT  --   --  447*  --   --   --   APTT 35  --   --   --   --   --   HEPARINUNFRC  --    < > 0.32 0.31 0.57 0.42  CREATININE  --   --  0.87  --   --   --    < > = values in this interval not displayed.    Estimated Creatinine Clearance: 62.3 mL/min (by C-G formula based on SCr of 0.87 mg/dL).  Assessment: 85 yo F presents with incidental finding of hepatic mass. On Xarelto PTA for Afib. Holding for biliary drain and liver biopsy. Last dose was 12/29 in the am. Now s/p biliary drain placement and liver biopsy.  Heparin level this morning remains therapeutic (HL 0.42 << 0.57, goal of 0.3-0.7). No CBC since 1/8 - stable at that time. No overt bleeding noted.   Pt is being dc today on Xarelto. Scr <1. Resume at standard dose.  Goal of Therapy:   Monitor platelets by anticoagulation protocol: Yes   Plan:   Dc heparin Xarelto 20mg  PO qday with supper  Onnie Boer, PharmD, [place], AAHIVP, CPP Infectious Disease Pharmacist 02/12/2018 4:31 PM

## 2018-02-12 NOTE — Progress Notes (Signed)
PT Cancellation Note  Patient Details Name: Jordan Byrd MRN: [id number] DOB: 1933-02-28   Cancelled Treatment:    Reason Eval/Treat Not Completed: Patient at procedure or test/unavailable. Pt currently off unit for MRI. Noted plan is for possible d/c to SNF today. Will continue to follow.    Thelma Comp 02/12/2018, 11:21 AM  Rolinda Roan, PT, DPT Acute Rehabilitation Services Pager: [phone] Office: [phone]

## 2018-02-12 NOTE — Progress Notes (Addendum)
Referring Physician(s): Dr Tera Partridge  Supervising Physician: Jacqulynn Cadet  Patient Status:  [facility] - In-pt  Chief Complaint:  Obstructive jaundice Biliary Int/ext drain placed in IR 12/31 1/7 Bile duct brush bx - Neg for malig cells MRI today pending  Subjective:  Pt nauseated She always gets so nauseated after she eats MRI done this am--- pending OP is great from drain TB 2.0  Allergies: Methocarbamol; Vicodin [hydrocodone-acetaminophen]; Aspirin; Butazolidin [phenylbutazone]; Celebrex [celecoxib]; Codeine; Doripenem; Aspirin buf(alhyd-mghyd-cacar); Mucinex [guaifenesin er]; and Temazepam  Medications: Prior to Admission medications   Medication Sig Start Date End Date Taking? Authorizing Provider  acetaminophen (TYLENOL) 500 MG tablet Take 1,000 mg by mouth 2 (two) times daily.   Yes [provider]  albuterol (PROVENTIL HFA;VENTOLIN HFA) 108 (90 BASE) MCG/ACT inhaler Inhale 1 puff into the lungs every 6 (six) hours as needed for wheezing. Reported on 04/10/2015   Yes [provider]  amLODipine (NORVASC) 10 MG tablet Take 10 mg by mouth daily.   Yes [provider]  budesonide-formoterol (SYMBICORT) 160-4.5 MCG/ACT inhaler Inhale 2 puffs into the lungs See admin instructions. Inhale 1 puff every morning, may inhale a 2nd puff in the evening if needed for wheezing   Yes [provider]  losartan (COZAAR) 100 MG tablet Take 100 mg by mouth daily.   Yes [provider]  Multiple Vitamin (MULTIVITAMIN WITH MINERALS) TABS tablet Take 1 tablet by mouth daily.   Yes [provider]  Multiple Vitamins-Minerals (ICAPS) CAPS Take 1 capsule by mouth daily.   Yes [provider]  Nebivolol HCl (BYSTOLIC) 20 MG TABS Take 1 tablet (20 mg total) by mouth daily. 10/24/15  Yes Allred, Jeneen Rinks, MD  nitroGLYCERIN (NITROSTAT) 0.4 MG SL tablet Place 0.4 mg under the tongue every 5 (five) minutes as needed for chest pain.   Yes  [provider]  omeprazole (PRILOSEC) 20 MG capsule Take 20 mg by mouth daily.   Yes [provider]  potassium chloride (K-DUR) 10 MEQ tablet Take 10 mEq by mouth daily.  04/10/15  Yes [provider]  rivaroxaban (XARELTO) 20 MG TABS tablet Take 1 tablet (20 mg total) by mouth daily with supper. Patient taking differently: Take 20 mg by mouth daily with breakfast.  10/24/15  Yes Allred, Jeneen Rinks, MD  traMADol (ULTRAM) 50 MG tablet Take 50 mg by mouth 2 (two) times daily.   Yes [provider]     Vital Signs: BP (!) 148/61 (BP Location: Right Arm)   Pulse 71   Temp 97.8 F (36.6 C) (Oral)   Resp (!) 22   Ht 5' 6" (1.676 m)   Wt 239 lb 10.2 oz (108.7 kg)   SpO2 100%   BMI 38.68 kg/m   Physical Exam Vitals signs reviewed.  Abdominal:     General: Bowel sounds are normal.     Palpations: Abdomen is soft.  Musculoskeletal: Normal range of motion.  Skin:    General: Skin is warm and dry.     Comments: Site is clean and dry NT no bleeding OP cloudy yellow 1.5 L yesterday 200 cc in bag  Neurological:     General: No focal deficit present.     Mental Status: She is alert and oriented to person, place, and time.  Psychiatric:        Mood and Affect: Mood normal.        Behavior: Behavior normal.     Imaging: Ir Exchange Biliary Drain  Result  Date: 02/09/2018 INDICATION: 85 year old female with a history of central obstructing mass in the liver with biliary ductal dilatation and jaundice, initially treated with right-sided PTC and drainage 02/02/2018. She presents today for repositioning of the drain which has been partially withdrawn and a brush biopsy EXAM: ULTRASOUND-GUIDED THROUGH THE TUBE CHOLANGIOGRAM AND EXCHANGE OF WITHDRAWN BILIARY TUBE PLACEMENT OF NEW 12 FRENCH DRAIN ENDOLUMINAL BRUSH BIOPSY MEDICATIONS: None ANESTHESIA/SEDATION: Moderate (conscious) sedation was employed during this procedure. A total of Versed 2.5 mg and Fentanyl 125  mcg was administered intravenously. Moderate Sedation Time: 25 minutes. The patient's level of consciousness and vital signs were monitored continuously by radiology nursing throughout the procedure under my direct supervision. FLUOROSCOPY TIME:  Fluoroscopy Time: 6 minutes 18 seconds (192 mGy). COMPLICATIONS: None PROCEDURE: Informed written consent was obtained from the patient after a thorough discussion of the procedural risks, benefits and alternatives. All questions were addressed. Maximal Sterile Barrier Technique was utilized including caps, mask, sterile gowns, sterile gloves, sterile drape, hand hygiene and skin antiseptic. A timeout was performed prior to the initiation of the procedure. Patient positioned supine position on the fluoroscopy table. Patient is prepped and draped in the usual sterile fashion. Scout images were acquired of the upper abdomen. Through the tube cholangiogram was performed demonstrating withdrawal of the previously existing 10 [place] biliary drain. A Glidewire was navigated into the duodenum through the existing tract. Drain was removed from the [employer]. A short Kumpe the catheter was then navigated into the duodenum. Rose in wire was placed through the Kumpe the catheter and the Kumpe the catheter was withdrawn. A 7 French 35 cm bright tip sheath was then placed over the Rose an wire into the biliary system. Introducer was withdrawn and a pull-back cholangiogram was performed. The targeted narrowing was just cephalad of the clips in the liver hilum, at a region of ductal narrowing in the presumed area of tumor. Introducer was placed and the sheath was then again pushed beyond the occlusion. Coaxial cytology brush biopsy kit was advanced through the sheath, parallel to the safety wire. The sheath was then withdrawn and the brush biopsy kit was withdrawn into the region of narrowing with agitation across the lesion. Brush was then withdrawn placed into saline comp for cytology.  This biopsy was then repeated for a total of 2 brush biopsies. The sheath was then withdrawn and we attempted to place a 12 [place] biliary drain over the Rose an wire. The tract required 12 French dilation. The 12 French drain was again attempted to place over the [facility] an wire, unsuccessful given the angle of approach through the abdominal wall. Kumpe the catheter was replaced to the duodenum and the Rose an wire was exchanged for stiff Amplatz 160 cm wire. We were then successful with placing the 12 [place] biliary drain over the wire into the duodenum, with the proximal side holes positioned just proximal to the presumed site of occlusion/tumor. Drain was sutured in position.  Final images were stored. Gravity drain was attached. Patient tolerated the procedure well and remained hemodynamically stable throughout. No complications were encountered and no significant blood loss. IMPRESSION: Status post routine exchange and up sizing of PTC/drainage with a new 12 French drain placed. 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 Signed, Dulcy Fanny. Dellia Nims, RPVI Vascular and Interventional Radiology Specialists [facility] Radiology Electronically Signed   By: Corrie Mckusick D.O.   On: 02/09/2018 10:41   Ir Endoluminal Bx Of Biliary Tree  Result Date: 02/09/2018 INDICATION: 85 year old female with a history of central obstructing mass in the liver with biliary ductal dilatation and jaundice, initially treated with right-sided PTC and drainage 02/02/2018. She presents today for repositioning of the drain which has been partially withdrawn and a brush biopsy EXAM: ULTRASOUND-GUIDED THROUGH THE TUBE CHOLANGIOGRAM AND EXCHANGE OF WITHDRAWN BILIARY TUBE PLACEMENT OF NEW 12 FRENCH DRAIN ENDOLUMINAL BRUSH BIOPSY MEDICATIONS: None ANESTHESIA/SEDATION: Moderate (conscious) sedation was employed during this procedure. A total of Versed 2.5 mg and Fentanyl 125 mcg was  administered intravenously. Moderate Sedation Time: 25 minutes. The patient's level of consciousness and vital signs were monitored continuously by radiology nursing throughout the procedure under my direct supervision. FLUOROSCOPY TIME:  Fluoroscopy Time: 6 minutes 18 seconds (192 mGy). COMPLICATIONS: None PROCEDURE: Informed written consent was obtained from the patient after a thorough discussion of the procedural risks, benefits and alternatives. All questions were addressed. Maximal Sterile Barrier Technique was utilized including caps, mask, sterile gowns, sterile gloves, sterile drape, hand hygiene and skin antiseptic. A timeout was performed prior to the initiation of the procedure. Patient positioned supine position on the fluoroscopy table. Patient is prepped and draped in the usual sterile fashion. Scout images were acquired of the upper abdomen. Through the tube cholangiogram was performed demonstrating withdrawal of the previously existing 10 [place] biliary drain. A Glidewire was navigated into the duodenum through the existing tract. Drain was removed from the [employer]. A short Kumpe the catheter was then navigated into the duodenum. Rose in wire was placed through the Kumpe the catheter and the Kumpe the catheter was withdrawn. A 7 French 35 cm bright tip sheath was then placed over the Rose an wire into the biliary system. Introducer was withdrawn and a pull-back cholangiogram was performed. The targeted narrowing was just cephalad of the clips in the liver hilum, at a region of ductal narrowing in the presumed area of tumor. Introducer was placed and the sheath was then again pushed beyond the occlusion. Coaxial cytology brush biopsy kit was advanced through the sheath, parallel to the safety wire. The sheath was then withdrawn and the brush biopsy kit was withdrawn into the region of narrowing with agitation across the lesion. Brush was then withdrawn placed into saline comp for cytology. This  biopsy was then repeated for a total of 2 brush biopsies. The sheath was then withdrawn and we attempted to place a 12 [place] biliary drain over the Rose an wire. The tract required 12 French dilation. The 12 French drain was again attempted to place over the [facility] an wire, unsuccessful given the angle of approach through the abdominal wall. Kumpe the catheter was replaced to the duodenum and the Rose an wire was exchanged for stiff Amplatz 160 cm wire. We were then successful with placing the 12 [place] biliary drain over the wire into the duodenum, with the proximal side holes positioned just proximal to the presumed site of occlusion/tumor. Drain was sutured in position.  Final images were stored. Gravity drain was attached. Patient tolerated the procedure well and remained hemodynamically stable throughout. No complications were encountered and no significant blood loss. IMPRESSION: Status post routine exchange and up sizing of PTC/drainage with a new 12 French drain placed. 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 Signed, Dulcy Fanny. Dellia Nims, RPVI Vascular and Interventional Radiology Specialists [facility] Radiology Electronically Signed   By: Corrie Mckusick D.O.   On: 02/09/2018 10:41    Labs:  CBC: Recent  Labs    02/07/18 0359 02/08/18 0531 02/09/18 1136 02/10/18 0642  WBC 11.1* 25.0* 17.0* 12.1*  HGB 10.4* 10.2* 10.5* 10.3*  HCT 33.6* 34.3* 34.0* 34.3*  PLT 471* 438* 500* 447*    COAGS: Recent Labs    02/01/18 1712 02/03/18 0512 02/08/18 0531 02/08/18 1140 02/09/18 0512 02/09/18 2236  INR 1.16  --   --   --  1.10  --   APTT  --  60* 84* 91*  --  35    BMP: Recent Labs    02/07/18 0359 02/08/18 0531 02/09/18 1136 02/10/18 0642  NA 136 134* 138 138  K 4.7 4.6 4.1 4.3  CL 100 100 101 101  CO2 27 20* 27 27  GLUCOSE 92 154* 111* 82  BUN 22 32* 40* 37*  CALCIUM 8.8* 8.5* 8.8* 8.8*  CREATININE 0.90 1.23* 1.04* 0.87    GFRNONAA 60* 41* 50* >60  GFRAA >60 47* 58* >60    LIVER FUNCTION TESTS: Recent Labs    02/09/18 1136 02/10/18 0642 02/11/18 0714 02/12/18 0628  BILITOT 2.7* 2.5* 2.4* 2.0*  AST 72* 78* 60* 59*  ALT 179* 171* 158* 151*  ALKPHOS 424* 358* 347* 340*  PROT 6.6 6.1* 6.3* 6.7  ALBUMIN 2.3* 2.2* 2.2* 2.6*    Assessment and Plan:  Int/Ext drain intact Output is great Brush Bx neg MRI pending Will follow  Electronically Signed: Lavonia Drafts, PA-C 02/12/2018, 1:17 PM   I spent a total of 15 Minutes at the the patient's bedside AND on the patient's hospital floor or unit, greater than 50% of which was counseling/coordinating care for Biliary drain

## 2018-02-12 NOTE — Progress Notes (Signed)
[name] to be D/C'd Rehab per MD order.  Discussed prescriptions and follow up appointments with the patient. Prescriptions given to patient, medication list explained in detail. Pt verbalized understanding.  Allergies as of 02/12/2018      Reactions   Methocarbamol Hives   Vicodin [hydrocodone-acetaminophen] Other (See Comments)   Interacts with bp medication and drops blood pressure   Aspirin Nausea And Vomiting   Butazolidin [phenylbutazone] Other (See Comments)   Unknown reaction   Celebrex [celecoxib] Nausea And Vomiting   Codeine Nausea And Vomiting   Doripenem Rash   Aspirin Buf(alhyd-mghyd-cacar) Nausea And Vomiting   Mucinex [guaifenesin Er] Itching   Temazepam Other (See Comments)   Unknown reaction      Medication List    STOP taking these medications   acetaminophen 500 MG tablet Commonly known as:  TYLENOL   ICAPS Caps   losartan 100 MG tablet Commonly known as:  COZAAR   potassium chloride 10 MEQ tablet Commonly known as:  K-DUR     TAKE these medications   albuterol 108 (90 Base) MCG/ACT inhaler Commonly known as:  PROVENTIL HFA;VENTOLIN HFA Inhale 1 puff into the lungs every 6 (six) hours as needed for wheezing. Reported on 04/10/2015   amLODipine 10 MG tablet Commonly known as:  NORVASC Take 10 mg by mouth daily.   budesonide-formoterol 160-4.5 MCG/ACT inhaler Commonly known as:  SYMBICORT Inhale 2 puffs into the lungs See admin instructions. Inhale 1 puff every morning, may inhale a 2nd puff in the evening if needed for wheezing   feeding supplement (ENSURE ENLIVE) Liqd Take 237 mLs by mouth 3 (three) times daily between meals.   multivitamin with minerals Tabs tablet Take 1 tablet by mouth daily.   Nebivolol HCl 20 MG Tabs Commonly known as:  BYSTOLIC Take 1 tablet (20 mg total) by mouth daily.   nitroGLYCERIN 0.4 MG SL tablet Commonly known as:  NITROSTAT Place 0.4 mg under the tongue every 5 (five) minutes as needed for chest pain.   omeprazole 20 MG capsule Commonly known as:  PRILOSEC Take 20 mg by mouth daily.   ondansetron 4 MG tablet Commonly known as:  ZOFRAN Take 1 tablet (4 mg total) by mouth every 8 (eight) hours as needed for nausea or vomiting.   polyethylene glycol packet Commonly known as:  MIRALAX / GLYCOLAX Take 17 g by mouth 2 (two) times daily.   rivaroxaban 20 MG Tabs tablet Commonly known as:  XARELTO Take 1 tablet (20 mg total) by mouth daily with supper. What changed:  when to take this   senna-docusate 8.6-50 MG tablet Commonly known as:  Senokot-S Take 1 tablet by mouth 2 (two) times daily.   traMADol 50 MG tablet Commonly known as:  ULTRAM Take 1 tablet (50 mg total) by mouth 2 (two) times daily.       Vitals:   02/12/18 0539 02/12/18 1751  BP: (!) 148/61 (!) 131/55  Pulse: 71 82  Resp: (!) 22 18  Temp: 97.8 F (36.6 C) 98.2 F (36.8 C)  SpO2: 100% 98%    Skin clean, dry and intact without evidence of skin break down, no evidence of skin tears noted. IV catheter discontinued intact. Site without signs and symptoms of complications. Dressing and pressure applied. Pt denies pain at this time. No complaints noted.  An After Visit Summary was printed and given to the patient. Patient escorted picked by ambulance transport at bedside.

## 2018-02-12 NOTE — Clinical Social Work Placement (Signed)
   CLINICAL SOCIAL WORK PLACEMENT  NOTE 02/12/18 - DISCHARGED TO [facility] VIA AMBULANCE.  Date:  02/12/2018  Patient Details  Name: Jordan Byrd MRN: [id number] Date of Birth: 1934/01/26  Clinical Social Work is seeking post-discharge placement for this patient at the [place] level of care (*CSW will initial, date and re-position this form in  chart as items are completed):  No(Patient provided CSW with facility preferences)   Patient/family provided with [place] Work Department's list of facilities offering this level of care within the geographic area requested by the patient (or if unable, by the patient's family).  Yes   Patient/family informed of their freedom to choose among providers that offer the needed level of care, that participate in Medicare, Medicaid or managed care program needed by the patient, have an available bed and are willing to accept the patient.  No   Patient/family informed of [place]'s ownership interest in [facility] and [facility], as well as of the fact that they are under no obligation to receive care at these facilities.  PASRR submitted to EDS on       PASRR number received on       Existing PASRR number confirmed on 02/05/18     FL2 transmitted to all facilities in geographic area requested by pt/family on 02/05/18     FL2 transmitted to all facilities within larger geographic area on       Patient informed that his/her managed care company has contracts with or will negotiate with certain facilities, including the following:        Yes   Patient/family informed of bed offers received.  Patient chooses bed at [facility]  Physician recommends and patient chooses bed at       Patient to be transferred to [facility] on 02/12/18   Patient to be transferred to facility by  Ambulance     Patient family notified on  02/12/18 of transfer.  Name of  family member notified:   [name] - [phone]     PHYSICIAN       Additional Comment:  02/12/18 - Facility received authorization from [place]   _______________________________________________ Sable Feil, [place] 02/12/2018, 5:28 PM

## 2018-02-12 NOTE — Care Management Note (Signed)
Case Management Note Manya Silvas, RN MSN CCM Transitions of Care 28M [place] [phone]  Patient Details  Name: Jordan Byrd MRN: [id number] Date of Birth: [birth year]  Subjective/Objective:                 Obstructive jaundice   Action/Plan: PTA home with [facility]-PT, OT. Grandson lives 2 doors down. Daughter at bedside-pt agreed to conversation in daughter's presence. Pt verbalized that PT is recommending snf for STR-pt stated that she has been to [facility] in the past and if possible would like to go there, CSW aware. No DME needs at home-front wheel walker, rollator, 3N1. No medication issues until she falls into doughnut hole; discussed reaching out to pharmaceutical companies and PCP samples. PCP is Dr. Levin Erp. Grandson provides transportation to appointments. Discussed referral to [facility], pt and family agreed. Will continue to follow for transition of care needs.   Expected Discharge Date:  02/12/18               Expected Discharge Plan:  [place]  In-House Referral:  Clinical Social Work  Discharge planning Services  CM Consult  Post Acute Care Choice:  NA([facility]-PT, OT) Choice offered to:  NA  DME Arranged:  N/A DME Agency:  NA  HH Arranged:  NA HH Agency:  NA  Status of Service:  Completed, signed off  If discussed at [place] of Stay Meetings, dates discussed:    Additional Comments:  02/12/2018 1647 - Pt to transition to snf-[facility] today. No other transition of care needs identified.   Bartholomew Crews, RN 02/12/2018, 4:47 PM

## 2018-02-12 NOTE — Discharge Summary (Addendum)
Physician Discharge Summary  Jordan Byrd PYK:[id number] DOB: 26-Jul-1933  PCP: Levin Erp, MD  Admit date: 02/01/2018 Discharge date: 02/12/2018  Recommendations for Outpatient Follow-up:  1. MD at SNF in 2 to 3 days with repeat labs (CBC & CMP). 2. Dr Zola Button, Medical Oncology: Office will arrange follow-up and SNF to coordinate with them. 3. Interventional Radiology at [facility]: As per my discussion with IR today, advised to follow-up in 6 weeks for percutaneous drain exchange and repeated brush biopsy. 4. Dr. Levin Erp, PCP upon discharge from SNF. 5. Orthopedic/hand surgery consultation for follow-up of dislocated finger.  Home Health: Patient being discharged to SNF. Equipment/Devices: TBD at SNF.  Discharge Condition: Improved and stable. CODE STATUS: DNR. Diet recommendation: Heart healthy diet.  Discharge Diagnoses:  Principal Problem:   Obstructive jaundice Active Problems:   Hypertension   CKD (chronic kidney disease) stage 3, GFR 30-59 ml/min (HCC)   Chronic diastolic heart failure (HCC)   Atrial fibrillation (HCC)   Falls frequently   Brief Summary: 85 year old female with PMH of HTN, CHF, remote breast cancer, A. fib on Xarelto presented to the ED after a fall at home/frequent falls.  She reportedly has been losing balance a lot since Thanksgiving and her knees have been giving out.  Family noticed yellowish discoloration.  In ED, finger dislocation, relocated and splint in place.  Jaundiced with bilirubin of 13.6.  [facility], IR and oncology consulted.  Underwent extensive evaluation as noted below.  Cancer diagnosis not yet histopathologically confirmed.   Assessment & Plan:  Obstructive painless jaundice/liver mass, concern for cholangiocarcinoma Presented with clinical jaundice, abnormal LFTs and total bilirubin of 13.6. CT abdomen and pelvis 12/30: Ill-defined central hepatic mass in the left lower lobe of liver measuring approximately  5.5 x 3.3 x 3.6 cm with associated severe intrahepatic biliary ductal dilatation.  Findings highly concerning for cholangiocarcinoma. [facility] consulted on 12/30 and recommended IR evaluation for liver biopsy and possible stenting of intrahepatic bile duct.  Since CBD of normal caliber, ERCP not indicated.   On 12/31, IR placed percutaneous 10 French internal/external drain in right liver lobe but unable to get good access from left hepatic ducts.  Fluid sent for culture and cytology. AFP was 6.5, CA-19-9 was 7571, and CA 125 was 67.6 and CEA was 4.0 S/p PTC with I/E biliary drain, bilirubin started to trend down. On 1/5, patient reported severe abdominal pain with associated worsening of LFTs including bilirubin.  Drain appeared to be malfunctioning. On 1/7, IR performed exchange of retracted right PTC/drain of biliary tree and new 12 [place] biliary drain placed.  Brush biopsy of the tight lesion of biliary tree performed. Xarelto held and patient on IV heparin bridge for procedures.  IR to advise timing of resumption of Xarelto. Cytopathology: Bile duct brushing from 02/09/2018: No malignant cells identified. Cytopathology: Bile duct brushing fluid 02/02/2018: No malignant cells identified. Consider oncology consultation. As discussed with IR on 1/9, no further mass on recent CT abdomen 1/5 to biopsy. I consulted Dr. Alen Blew, oncology and his input appreciated.  DD: Likely cholangiocarcinoma, less likely hepatocellular carcinoma but could also be benign biliary stricture.  He recommends PET scan as outpatient after a short interval of time and hopefully that will reveal something that can be biopsied to confirm malignancy.  However if malignancy is confirmed, she would not be a candidate for curative surgical resection but more palliative radiation therapy and chemotherapy.  He will arrange outpatient follow-up and PET/CT upon  discharge.  He does not think that her current problem is related to prior  breast cancer diagnosis.  Interventional radiology recommended abdominal MRI to look for a liver primary.  MRI report appreciated.  I discussed with Dr. Laurence Ferrari, IR today who indicated that the lesion is not amenable to percutaneous biopsy.  He recommended follow-up in 6 weeks for drain exchange at which time brush biopsy can be repeated.  He also indicated that given her markedly elevated CA 19-9, radiological findings, this should be confirmatory enough for her cholangiocarcinoma diagnosis.  Frequent falls Suspected due to profound weakness and deconditioning relating to underlying malignancy. SNF for rehab.  Paroxysmal A. Fib Currently in sinus rhythm.  Rate controlled on nebivolol 20 mg daily.  Xarelto was held and patient was on IV heparin for procedures.  Xarelto resumed at discharge.  Acute on chronic diastolic CHF Noted volume overload on 1/5.  Received a dose of IV Lasix.    10.5 L since admission. TTE 02/08/2018: LVEF 55-60%.  Rate 1 diastolic dysfunction. No clinical bronchospasm, discontinued IV Solu-Medrol. Clinically euvolemic.  Not on diuretics.  Acute on stage III chronic kidney disease In the setting of GI losses, losartan.  Losartan discontinued.  Creatinine has normalized.  Periodically monitor BMP.  Essential hypertension Controlled on amlodipine 10 mg daily and nebivolol 20 mg daily.  Losartan discontinued due to acute kidney injury.  Nausea and vomiting In the context of liver mass.  Improved.  Morbid obesity/Body mass index is 40.14 kg/m.  Right shoulder pain Suspected biliary origin and drain placement.  Resolved.  Normocytic anemia/iron deficiency anemia Stable.  Consider iron supplementation.  Asymptomatic bacteriuria No antibiotics recommended.  Suspected pulmonary nodules Noted on CTA chest which showed no PE.  Concern for pulmonary metastasis.  Needs follow-up.  Constipation Continue bowel regimen.  Leukocytosis Suspect due to  steroids.  Almost back to normal.  Right index finger dislocation Resolved and ED.  Has splint.  Consider outpatient orthopedic follow-up.   Consultants:  [facility] Interventional radiology Medical oncology  Procedures:  As noted above   Discharge Instructions  Discharge Instructions    Call MD for:  difficulty breathing, headache or visual disturbances   Complete by:  As directed    Call MD for:  extreme fatigue   Complete by:  As directed    Call MD for:  persistant dizziness or light-headedness   Complete by:  As directed    Call MD for:  persistant nausea and vomiting   Complete by:  As directed    Call MD for:  redness, tenderness, or signs of infection (pain, swelling, redness, odor or green/yellow discharge around incision site)   Complete by:  As directed    Call MD for:  severe uncontrolled pain   Complete by:  As directed    Call MD for:  temperature >100.4   Complete by:  As directed    Diet - low sodium heart healthy   Complete by:  As directed    Increase activity slowly   Complete by:  As directed        Medication List    STOP taking these medications   acetaminophen 500 MG tablet Commonly known as:  TYLENOL   ICAPS Caps   losartan 100 MG tablet Commonly known as:  COZAAR   potassium chloride 10 MEQ tablet Commonly known as:  K-DUR     TAKE these medications   albuterol 108 (90 Base) MCG/ACT inhaler Commonly known as:  PROVENTIL HFA;VENTOLIN  HFA Inhale 1 puff into the lungs every 6 (six) hours as needed for wheezing. Reported on 04/10/2015   amLODipine 10 MG tablet Commonly known as:  NORVASC Take 10 mg by mouth daily.   budesonide-formoterol 160-4.5 MCG/ACT inhaler Commonly known as:  SYMBICORT Inhale 2 puffs into the lungs See admin instructions. Inhale 1 puff every morning, may inhale a 2nd puff in the evening if needed for wheezing   feeding supplement (ENSURE ENLIVE) Liqd Take 237 mLs by mouth 3 (three) times daily between  meals.   multivitamin with minerals Tabs tablet Take 1 tablet by mouth daily.   Nebivolol HCl 20 MG Tabs Commonly known as:  BYSTOLIC Take 1 tablet (20 mg total) by mouth daily.   nitroGLYCERIN 0.4 MG SL tablet Commonly known as:  NITROSTAT Place 0.4 mg under the tongue every 5 (five) minutes as needed for chest pain.   omeprazole 20 MG capsule Commonly known as:  PRILOSEC Take 20 mg by mouth daily.   ondansetron 4 MG tablet Commonly known as:  ZOFRAN Take 1 tablet (4 mg total) by mouth every 8 (eight) hours as needed for nausea or vomiting.   polyethylene glycol packet Commonly known as:  MIRALAX / GLYCOLAX Take 17 g by mouth 2 (two) times daily.   rivaroxaban 20 MG Tabs tablet Commonly known as:  XARELTO Take 1 tablet (20 mg total) by mouth daily with supper. What changed:  when to take this   senna-docusate 8.6-50 MG tablet Commonly known as:  Senokot-S Take 1 tablet by mouth 2 (two) times daily.   traMADol 50 MG tablet Commonly known as:  ULTRAM Take 1 tablet (50 mg total) by mouth 2 (two) times daily.      Follow-up Information    [facility] Follow up.   Why:  IR scheduler will call you with appointment date/time for biopsy as well as drain follow up Contact information: [place] [address] [phone]        Markus Daft, MD Follow up in 1 week(s).   Specialties:  Interventional Radiology, Radiology Why:  pt will hear from scheduler for OP bile duct bx later this week-- need OFF Xarelto 2 days prior to bx-- flush drain 5-10 cc daily (even at [facility]); [phone] if questions Contact information: [place] [address] [phone]        MD at SNF. Schedule an appointment as soon as possible for a visit.   Why:  To be seen in 2 to 3 days with repeat labs (CBC & CMP).       Wyatt Portela, MD. Schedule an appointment as soon as possible for a visit.   Specialty:   Oncology Contact information: [place] [address] [phone]        Levin Erp, MD. Schedule an appointment as soon as possible for a visit.   Specialty:  Internal Medicine Why:  Upon discharge from SNF. Contact information: [address], [address] [place] [address] [phone]          Allergies  Allergen Reactions  . Methocarbamol Hives  . Vicodin [Hydrocodone-Acetaminophen] Other (See Comments)    Interacts with bp medication and drops blood pressure  . Aspirin Nausea And Vomiting  . Butazolidin [Phenylbutazone] Other (See Comments)    Unknown reaction  . Celebrex [Celecoxib] Nausea And Vomiting  . Codeine Nausea And Vomiting  . Doripenem Rash  . Aspirin Buf(Alhyd-Mghyd-Cacar) Nausea  And Vomiting  . Mucinex [Guaifenesin Er] Itching  . Temazepam Other (See Comments)    Unknown reaction      Procedures/Studies: Ct Head Wo Contrast  Result Date: 02/01/2018 CLINICAL DATA:  Fall.  No loss of consciousness. EXAM: CT HEAD WITHOUT CONTRAST CT CERVICAL SPINE WITHOUT CONTRAST TECHNIQUE: Multidetector CT imaging of the head and cervical spine was performed following the standard protocol without intravenous contrast. Multiplanar CT image reconstructions of the cervical spine were also generated. COMPARISON:  None. FINDINGS: CT HEAD FINDINGS Brain: Mild chronic ischemic white matter disease is noted. No mass effect or midline shift is noted. Ventricular size is within normal limits. There is no evidence of mass lesion, hemorrhage or acute infarction. Vascular: No hyperdense vessel or unexpected calcification. Skull: Normal. Negative for fracture or focal lesion. Sinuses/Orbits: Right maxillary mucous retention cyst is noted. Other: None. CT CERVICAL SPINE FINDINGS Alignment: Normal. Skull base and vertebrae: No acute fracture. No primary bone lesion or focal pathologic process. Soft tissues and spinal canal: No prevertebral fluid or  swelling. No visible canal hematoma. Disc levels:  Status post surgical anterior fusion of C5-6 and C6-7. Upper chest: Negative. Other: Degenerative changes are seen involving posterior facet joints bilaterally. IMPRESSION: Mild chronic ischemic white matter disease. No acute intracranial abnormality seen. Postsurgical and degenerative changes are noted in the cervical spine as described above. No acute fracture or spondylolisthesis is noted. Electronically Signed   By: Marijo Conception, M.D.   On: 02/01/2018 07:21   Ct Angio Chest Pe W Or Wo Contrast  Result Date: 02/07/2018 CLINICAL DATA:  Dyspnea, wheezing, history asthma, CHF, worsened symptoms since yesterday, hyperventilation, abdominal distension; history of biliary dilatation and central biliary obstruction post percutaneous drainage EXAM: CT ANGIOGRAPHY CHEST CT ABDOMEN AND PELVIS WITH CONTRAST TECHNIQUE: Multidetector CT imaging of the chest was performed using the standard protocol during bolus administration of intravenous contrast. Multiplanar CT image reconstructions and MIPs were obtained to evaluate the vascular anatomy. Multidetector CT imaging of the abdomen and pelvis was performed using the standard protocol during bolus administration of intravenous contrast. CONTRAST:  172m ISOVUE-370 IOPAMIDOL (ISOVUE-370) INJECTION 76% IV. COMPARISON:  CT abdomen and pelvis 02/01/2018 FINDINGS: CTA CHEST FINDINGS Cardiovascular: Atherosclerotic calcifications of aorta, proximal great vessels and coronary arteries. Cardiac chambers appear mildly enlarged. No pericardial effusion. Pulmonary arteries adequately opacified and patent. No evidence of pulmonary embolism. Mediastinum/Nodes: Base of cervical region normal appearance. No thoracic adenopathy. Esophagus unremarkable. Lungs/Pleura: Small focus of infiltrate or atelectasis at anterior LEFT upper lobe image 51. Multiple tiny BILATERAL lung nodules, noncalcified. 7 mm LEFT lower lobe nodule image 50.  Remaining lungs clear. No infiltrate, pleural effusion or pneumothorax Musculoskeletal: Osseous demineralization. Prior cervical spine fusion. No definite bone lesions. Review of the MIP images confirms the above findings. CT ABDOMEN and PELVIS FINDINGS Hepatobiliary: Percutaneous biliary drain decompression of the RIGHT biliary system since previous exam. Persistent dilatation of the intrahepatic biliary radicles of the LEFT lobe. No focal hepatic mass lesion or visualized obstructing mass at the central biliary tree. Gallbladder surgically absent. Pancreas: Normal appearance Spleen: Normal appearance Adrenals/Urinary Tract: Adrenal glands normal appearance. Tiny cyst at inferior pole RIGHT kidney. Kidneys, ureters, and bladder otherwise unremarkable. Stomach/Bowel: Appendix surgically absent by history. Dense retained contrast in transverse colon. Stomach decompressed. Stomach and bowel loops otherwise unremarkable. Vascular/Lymphatic: Atherosclerotic calcifications of aorta and iliac arteries. Aorta normal caliber. No adenopathy. Reproductive: Uterus surgically absent with nonvisualization of ovaries Other: No free air or free fluid. No hernia or  acute inflammatory process. Musculoskeletal: No acute osseous lesions. RIGHT hip prosthesis noted. Degenerative disc and facet disease changes lumbar spine. Review of the MIP images confirms the above findings. IMPRESSION: No evidence of pulmonary embolism. Multiple small BILATERAL noncalcified pulmonary nodules measuring up to 7 mm in largest diameter in LEFT lobe, nonspecific but pulmonary metastases are not excluded. Persistent intrahepatic biliary dilatation involving the LEFT lobe of the liver with decreased biliary dilatation of the RIGHT lobe biliary tree post percutaneous drainage. Scattered atherosclerotic calcifications including within coronary arteries. Electronically Signed   By: Lavonia Dana M.D.   On: 02/07/2018 17:33   Ct Cervical Spine Wo  Contrast  Result Date: 02/01/2018 CLINICAL DATA:  Fall.  No loss of consciousness. EXAM: CT HEAD WITHOUT CONTRAST CT CERVICAL SPINE WITHOUT CONTRAST TECHNIQUE: Multidetector CT imaging of the head and cervical spine was performed following the standard protocol without intravenous contrast. Multiplanar CT image reconstructions of the cervical spine were also generated. COMPARISON:  None. FINDINGS: CT HEAD FINDINGS Brain: Mild chronic ischemic white matter disease is noted. No mass effect or midline shift is noted. Ventricular size is within normal limits. There is no evidence of mass lesion, hemorrhage or acute infarction. Vascular: No hyperdense vessel or unexpected calcification. Skull: Normal. Negative for fracture or focal lesion. Sinuses/Orbits: Right maxillary mucous retention cyst is noted. Other: None. CT CERVICAL SPINE FINDINGS Alignment: Normal. Skull base and vertebrae: No acute fracture. No primary bone lesion or focal pathologic process. Soft tissues and spinal canal: No prevertebral fluid or swelling. No visible canal hematoma. Disc levels:  Status post surgical anterior fusion of C5-6 and C6-7. Upper chest: Negative. Other: Degenerative changes are seen involving posterior facet joints bilaterally. IMPRESSION: Mild chronic ischemic white matter disease. No acute intracranial abnormality seen. Postsurgical and degenerative changes are noted in the cervical spine as described above. No acute fracture or spondylolisthesis is noted. Electronically Signed   By: Marijo Conception, M.D.   On: 02/01/2018 07:21   Mr Abdomen W WU Contrast  Result Date: 02/12/2018 CLINICAL DATA:  Painless jaundice. Weight loss. Obstructive mass suspected in the LEFT hepatic lobe. EXAM: MRI ABDOMEN WITHOUT AND WITH CONTRAST TECHNIQUE: Multiplanar multisequence MR imaging of the abdomen was performed both before and after the administration of intravenous contrast. CONTRAST:  10 mL Gadavist COMPARISON:  CT abdomen 02/01/2018,  02/07/2018 FINDINGS: Lower chest: Hepatobiliary: Focal duct dilatation in the lateral segment of the LEFT hepatic lobe. There is a ill-defined region of high signal intensity on T2 weighted imaging at the confluence of this biliary obstruction measuring approximately 4.6 x 2.8 cm (image 17/5). Mass lesion at the junction of the LEFT hepatic lobe and RIGHT hepatic lobe. The mass is not well-defined on the post-contrast imaging. No lesion evident within the RIGHT hepatic lobe of the liver. Biliary stent from a percutaneous approach in the RIGHT hepatic lobe noted. Pancreas: No pancreatic inflammation or duct dilatation. Cystic lesion in the mid body pancreas measuring 8 mm (image 27/5) may communicate with ductal the duct is not enlarged. Spleen: Normal spleen. Adrenals/urinary tract: Adrenal glands and kidneys are normal. Stomach/Bowel: Stomach and limited of the small bowel is unremarkable Vascular/Lymphatic: Abdominal aortic normal caliber. No retroperitoneal periportal lymphadenopathy. Musculoskeletal: No aggressive osseous lesion IMPRESSION: 1. Ill-defined mass in the central LEFT hepatic lobe is presumed etiology of the severe LEFT hepatic lobe biliary obstruction. Lesion best depicted on T2 weighted imaging 2. No clear evidence of lesion within the RIGHT hepatic lobe. 3. Common bile duct normal. 4.  No pancreatic duct dilatation. 5. Cystic lesion in the body pancreas could represent a side branch IP MT. Recommend attention on routine follow-up. Electronically Signed   By: Suzy Bouchard M.D.   On: 02/12/2018 13:23   Ct Abdomen Pelvis W Contrast  Result Date: 02/07/2018 CLINICAL DATA:  Dyspnea, wheezing, history asthma, CHF, worsened symptoms since yesterday, hyperventilation, abdominal distension; history of biliary dilatation and central biliary obstruction post percutaneous drainage EXAM: CT ANGIOGRAPHY CHEST CT ABDOMEN AND PELVIS WITH CONTRAST TECHNIQUE: Multidetector CT imaging of the chest was  performed using the standard protocol during bolus administration of intravenous contrast. Multiplanar CT image reconstructions and MIPs were obtained to evaluate the vascular anatomy. Multidetector CT imaging of the abdomen and pelvis was performed using the standard protocol during bolus administration of intravenous contrast. CONTRAST:  142m ISOVUE-370 IOPAMIDOL (ISOVUE-370) INJECTION 76% IV. COMPARISON:  CT abdomen and pelvis 02/01/2018 FINDINGS: CTA CHEST FINDINGS Cardiovascular: Atherosclerotic calcifications of aorta, proximal great vessels and coronary arteries. Cardiac chambers appear mildly enlarged. No pericardial effusion. Pulmonary arteries adequately opacified and patent. No evidence of pulmonary embolism. Mediastinum/Nodes: Base of cervical region normal appearance. No thoracic adenopathy. Esophagus unremarkable. Lungs/Pleura: Small focus of infiltrate or atelectasis at anterior LEFT upper lobe image 51. Multiple tiny BILATERAL lung nodules, noncalcified. 7 mm LEFT lower lobe nodule image 50. Remaining lungs clear. No infiltrate, pleural effusion or pneumothorax Musculoskeletal: Osseous demineralization. Prior cervical spine fusion. No definite bone lesions. Review of the MIP images confirms the above findings. CT ABDOMEN and PELVIS FINDINGS Hepatobiliary: Percutaneous biliary drain decompression of the RIGHT biliary system since previous exam. Persistent dilatation of the intrahepatic biliary radicles of the LEFT lobe. No focal hepatic mass lesion or visualized obstructing mass at the central biliary tree. Gallbladder surgically absent. Pancreas: Normal appearance Spleen: Normal appearance Adrenals/Urinary Tract: Adrenal glands normal appearance. Tiny cyst at inferior pole RIGHT kidney. Kidneys, ureters, and bladder otherwise unremarkable. Stomach/Bowel: Appendix surgically absent by history. Dense retained contrast in transverse colon. Stomach decompressed. Stomach and bowel loops otherwise  unremarkable. Vascular/Lymphatic: Atherosclerotic calcifications of aorta and iliac arteries. Aorta normal caliber. No adenopathy. Reproductive: Uterus surgically absent with nonvisualization of ovaries Other: No free air or free fluid. No hernia or acute inflammatory process. Musculoskeletal: No acute osseous lesions. RIGHT hip prosthesis noted. Degenerative disc and facet disease changes lumbar spine. Review of the MIP images confirms the above findings. IMPRESSION: No evidence of pulmonary embolism. Multiple small BILATERAL noncalcified pulmonary nodules measuring up to 7 mm in largest diameter in LEFT lobe, nonspecific but pulmonary metastases are not excluded. Persistent intrahepatic biliary dilatation involving the LEFT lobe of the liver with decreased biliary dilatation of the RIGHT lobe biliary tree post percutaneous drainage. Scattered atherosclerotic calcifications including within coronary arteries. Electronically Signed   By: MLavonia DanaM.D.   On: 02/07/2018 17:33   Ct Abdomen Pelvis W Contrast  Result Date: 02/01/2018 CLINICAL DATA:  85year old female with history of jaundice. Dark urine. EXAM: CT ABDOMEN AND PELVIS WITH CONTRAST TECHNIQUE: Multidetector CT imaging of the abdomen and pelvis was performed using the standard protocol following bolus administration of intravenous contrast. CONTRAST:  1061mOMNIPAQUE IOHEXOL 300 MG/ML  SOLN COMPARISON:  No priors. FINDINGS: Lower chest: Aortic atherosclerosis. Atherosclerotic calcifications in the right coronary artery. Hepatobiliary: In the left lobe of the liver there is a central lesion that is poorly defined but estimated to measure approximately 5.5 x 3.3 x 3.6 cm (axial image 18 of series 3 and coronal image 51 of series 6). This  is associated with severe intrahepatic biliary ductal dilatation, which is most severe throughout segments 2 and 3, but involves all areas of the liver. Status post cholecystectomy. Common bile duct is not dilated.  Pancreas: No pancreatic mass. No pancreatic ductal dilatation. No pancreatic or peripancreatic fluid or inflammatory changes. Spleen: Unremarkable. Adrenals/Urinary Tract: 1.4 cm with simple cyst in the lower pole of the right kidney. Left kidney is normal in appearance. No hydroureteronephrosis. Urinary bladder is normal in appearance. Bilateral adrenal glands are normal in appearance. Stomach/Bowel: Normal appearance of the stomach. No pathologic dilatation of small bowel or colon. Normal appendix. Vascular/Lymphatic: Aortic atherosclerosis, without evidence of aneurysm or dissection in the abdominal or pelvic vasculature. No lymphadenopathy noted in the abdomen or pelvis. Reproductive: Status post hysterectomy. Ovaries are not confidently identified and may be surgically absent or atrophic. Other: No significant volume of ascites.  No pneumoperitoneum. Musculoskeletal: Status post right hip arthroplasty. There are no aggressive appearing lytic or blastic lesions noted in the visualized portions of the skeleton. IMPRESSION: 1. Ill-defined central hepatic mass in the left lobe of the liver estimated to measure approximately 5.5 x 3.3 x 3.6 cm with associated severe intrahepatic biliary ductal dilatation. Findings are highly concerning for cholangiocarcinoma. 2. Aortic atherosclerosis, in addition to at least right coronary artery disease. 3. Additional incidental findings, as above. Electronically Signed   By: Vinnie Langton M.D.   On: 02/01/2018 14:53   Dg Chest Port 1 View  Result Date: 02/08/2018 CLINICAL DATA:  85 year old female with shortness breath for the past week. Subsequent encounter. EXAM: PORTABLE CHEST 1 VIEW COMPARISON:  02/07/2018 chest CT and chest x-ray. FINDINGS: Mild cardiomegaly. Central pulmonary vascular prominence. No segmental consolidation or pneumothorax. CT detected pulmonary nodules not appreciated on present plain film exam. Calcified aorta. Calcified left carotid bifurcation.  Bilateral acromioclavicular joint degenerative changes. Prior surgery lower cervical spine. IMPRESSION: 1. Mild cardiomegaly. 2. Mild central pulmonary vascular prominence. 3. Blunting left costophrenic angle similar to prior exams and may be related to prominent epicardial fat pad/subsegmental atelectasis as seen on CT. 4. CT detected pulmonary nodules not appreciated on present plain film exam. Electronically Signed   By: Genia Del M.D.   On: 02/08/2018 07:05   Dg Chest Port 1 View  Result Date: 02/07/2018 CLINICAL DATA:  Shortness of breath with altered mental status. EXAM: PORTABLE CHEST 1 VIEW COMPARISON:  02/09/2015 FINDINGS: Lungs are adequately inflated without focal airspace consolidation or effusion. Cardiomediastinal silhouette and remainder of the exam is unchanged. IMPRESSION: No active disease. Electronically Signed   By: Marin Olp M.D.   On: 02/07/2018 12:37   Dg Hand Complete Right  Result Date: 02/01/2018 CLINICAL DATA:  Status post dislocation at the PIP joint of the index finger of the right hand. Post reduction views. EXAM: RIGHT HAND - COMPLETE 3+ VIEW COMPARISON:  Prevertebral in image of today's date at 6:29 a.m. FINDINGS: The previously dislocated PIP joint is now in normal alignment. There is a small bony density adjacent to the dorso-ulnar aspect of the head of the proximal phalanx of the index finger which may reflect an avulsion fracture. The PIP joint space is well maintained. There is severe osteoarthritic joint space loss with surrounding proliferative changes involving the DIP joint of the second as well as third, fourth, and fifth fingers. There are degenerative changes of the first [facility] joint. IMPRESSION: Successful relocation of the previously laterally dislocated index finger at the PIP joint. Possible small avulsion from the ulnar aspect of the  head of the proximal phalanx. Significant DIP joint osteoarthritic change of the second through fifth digits and of the  first [facility] joint. Electronically Signed   By: David  Martinique M.D.   On: 02/01/2018 08:01   Dg Hand Complete Right  Result Date: 02/01/2018 CLINICAL DATA:  Status post fall, hitting right hand on tub, with deformity of the right index finger. Initial encounter. EXAM: RIGHT HAND - COMPLETE 3+ VIEW COMPARISON:  None. FINDINGS: There is dislocation at the second proximal interphalangeal joint, with ulnar and dorsal angulation. No definite fracture is characterized. Degenerative change at the first carpometacarpal joint, and at the distal interphalangeal joints, raises concern for osteoarthritis. No definite soft tissue abnormalities are characterized on radiograph. Negative ulnar variance is noted. IMPRESSION: Dislocation at the second proximal interphalangeal joint, with ulnar and dorsal angulation. No definite fracture seen. Electronically Signed   By: Garald Balding M.D.   On: 02/01/2018 06:49   Dg Hips Bilat W Or Wo Pelvis 2 Views  Result Date: 02/01/2018 CLINICAL DATA:  Status post fall, with concern for pelvic injury. Initial encounter. EXAM: DG HIP (WITH OR WITHOUT PELVIS) 2V BILAT COMPARISON:  None. FINDINGS: There is no evidence of fracture or dislocation. The patient's right hip arthroplasty is grossly unremarkable in appearance, without evidence of significant loosening. The proximal left femur appears intact. Mild degenerative change is noted at the lower lumbar spine. The sacroiliac joints are unremarkable in appearance. The visualized bowel gas pattern is grossly unremarkable in appearance. Scattered phleboliths are noted within the pelvis. IMPRESSION: No evidence of fracture or dislocation. Right hip arthroplasty is grossly unremarkable in appearance. Electronically Signed   By: Garald Balding M.D.   On: 02/01/2018 06:51   Ir Int Lianne Cure Biliary Drain With Cholangiogram  Result Date: 02/02/2018 INDICATION: 85 year old with obstructive jaundice. Plan for percutaneous transhepatic cholangiogram  with biliary drain placement. EXAM: PERCUTANEOUS TRANSHEPATIC CHOLANGIOGRAM WITH ULTRASOUND AND FLUOROSCOPIC GUIDANCE PLACEMENT OF INTERNAL/EXTERNAL BILIARY DRAIN MEDICATIONS: Zosyn 3.375 g; The antibiotic was administered within an appropriate time frame prior to the initiation of the procedure. ANESTHESIA/SEDATION: Moderate (conscious) sedation was employed during this procedure. A total of Versed 2.0 mg and Fentanyl 200 mcg was administered intravenously. Moderate Sedation Time: 68 minutes. The patient's level of consciousness and vital signs were monitored continuously by radiology nursing throughout the procedure under my direct supervision. FLUOROSCOPY TIME:  Fluoroscopy Time: 11 minutes 42 seconds (125 mGy). CONTRAST:  40 mL [id number] COMPLICATIONS: None immediate. PROCEDURE: Informed written consent was obtained from the patient after a thorough discussion of the procedural risks, benefits and alternatives. All questions were addressed. Maximal Sterile Barrier Technique was utilized including caps, mask, sterile gowns, sterile gloves, sterile drape, hand hygiene and skin antiseptic. A timeout was performed prior to the initiation of the procedure. Patient was supine on the interventional table. The anterior and right abdomen was prepped and draped in sterile fashion. Ultrasound demonstrated dilated intrahepatic bile ducts. Initially, attention was directed to the left hepatic ducts. Skin was anesthetized with 1% lidocaine. Using ultrasound guidance, 21 gauge needle directed into dilated ducts but difficult to opacify the left lobe ducts. Wire would not advance centrally. As a result, attention was directed to the right hepatic lobe. Skin was anesthetized with 1% lidocaine. Using ultrasound guidance, a dilated right hepatic duct was punctured with a 21 gauge needle. Contrast injection confirmed placement in the biliary system. A 0.018 wire was advanced and Accustick dilator set was placed. A 5 French Kumpe  catheter was advanced into the  biliary system and additional cholangiogram was performed. Eventually, the central biliary system was successfully cannulated and a Glidewire was advanced into the extrahepatic bile duct. Additional cholangiograms were performed. Stiff Amplatz wire was advanced into the duodenum. Tract was dilated to accommodate a 10 [place] biliary drain. Large volume of yellow bilious fluid was removed from the biliary system. Samples sent for culture and cytology. Catheter was sutured to skin and attached to a gravity bag. Ultrasound confirmed that the left hepatic ducts were still dilated after placement of biliary drain. 21 gauge needle again directed into a dilated ducts with ultrasound but a wire would not advance centrally. FINDINGS: Severe intrahepatic biliary dilatation. Access was obtained from right hepatic lobe. Obstruction at the central hepatic ducts. Distal common bile duct is patent. Right biliary system was decompressed after drain placement. Persistent left biliary dilatation after placement of the right biliary drain. Left biliary drain was attempted but unsuccessful due to configuration of the obstruction. IMPRESSION: Central biliary obstruction. Findings are suggestive for a neoplastic process such as a cholangiocarcinoma. The obstruction was successfully crossed from a right hepatic approach and the right biliary system was decompressed with a biliary drain. Dilated left biliary system and unable to decompress the left biliary system at this time. Recommend a repeat CT or MRI after few days of biliary drainage in order to re-evaluate the central hepatic lesion and left biliary obstruction. 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 Fluid was sent for cytology and culture. Electronically Signed   By: Markus Daft M.D.   On: 02/02/2018 11:56   Ir Exchange Biliary Drain  Result Date: 02/09/2018 INDICATION: 85 year old  female with a history of central obstructing mass in the liver with biliary ductal dilatation and jaundice, initially treated with right-sided PTC and drainage 02/02/2018. She presents today for repositioning of the drain which has been partially withdrawn and a brush biopsy EXAM: ULTRASOUND-GUIDED THROUGH THE TUBE CHOLANGIOGRAM AND EXCHANGE OF WITHDRAWN BILIARY TUBE PLACEMENT OF NEW 12 FRENCH DRAIN ENDOLUMINAL BRUSH BIOPSY MEDICATIONS: None ANESTHESIA/SEDATION: Moderate (conscious) sedation was employed during this procedure. A total of Versed 2.5 mg and Fentanyl 125 mcg was administered intravenously. Moderate Sedation Time: 25 minutes. The patient's level of consciousness and vital signs were monitored continuously by radiology nursing throughout the procedure under my direct supervision. FLUOROSCOPY TIME:  Fluoroscopy Time: 6 minutes 18 seconds (192 mGy). COMPLICATIONS: None PROCEDURE: Informed written consent was obtained from the patient after a thorough discussion of the procedural risks, benefits and alternatives. All questions were addressed. Maximal Sterile Barrier Technique was utilized including caps, mask, sterile gowns, sterile gloves, sterile drape, hand hygiene and skin antiseptic. A timeout was performed prior to the initiation of the procedure. Patient positioned supine position on the fluoroscopy table. Patient is prepped and draped in the usual sterile fashion. Scout images were acquired of the upper abdomen. Through the tube cholangiogram was performed demonstrating withdrawal of the previously existing 10 [place] biliary drain. A Glidewire was navigated into the duodenum through the existing tract. Drain was removed from the [employer]. A short Kumpe the catheter was then navigated into the duodenum. Rose in wire was placed through the Kumpe the catheter and the Kumpe the catheter was withdrawn. A 7 French 35 cm bright tip sheath was then placed over the Rose an wire into the biliary system.  Introducer was withdrawn and a pull-back cholangiogram was performed. The targeted narrowing was just cephalad of the clips in the liver  hilum, at a region of ductal narrowing in the presumed area of tumor. Introducer was placed and the sheath was then again pushed beyond the occlusion. Coaxial cytology brush biopsy kit was advanced through the sheath, parallel to the safety wire. The sheath was then withdrawn and the brush biopsy kit was withdrawn into the region of narrowing with agitation across the lesion. Brush was then withdrawn placed into saline comp for cytology. This biopsy was then repeated for a total of 2 brush biopsies. The sheath was then withdrawn and we attempted to place a 12 [place] biliary drain over the Rose an wire. The tract required 12 French dilation. The 12 French drain was again attempted to place over the [facility] an wire, unsuccessful given the angle of approach through the abdominal wall. Kumpe the catheter was replaced to the duodenum and the Rose an wire was exchanged for stiff Amplatz 160 cm wire. We were then successful with placing the 12 [place] biliary drain over the wire into the duodenum, with the proximal side holes positioned just proximal to the presumed site of occlusion/tumor. Drain was sutured in position.  Final images were stored. Gravity drain was attached. Patient tolerated the procedure well and remained hemodynamically stable throughout. No complications were encountered and no significant blood loss. IMPRESSION: Status post routine exchange and up sizing of PTC/drainage with a new 12 French drain placed. 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 Signed, Dulcy Fanny. Dellia Nims, RPVI [facility] Radiology Electronically Signed   By: Corrie Mckusick D.O.   On: 02/09/2018 10:41   Ir Endoluminal Bx Of Biliary Tree  Result Date: 02/09/2018 INDICATION: 85 year old female with a  history of central obstructing mass in the liver with biliary ductal dilatation and jaundice, initially treated with right-sided PTC and drainage 02/02/2018. She presents today for repositioning of the drain which has been partially withdrawn and a brush biopsy EXAM: ULTRASOUND-GUIDED THROUGH THE TUBE CHOLANGIOGRAM AND EXCHANGE OF WITHDRAWN BILIARY TUBE PLACEMENT OF NEW 12 FRENCH DRAIN ENDOLUMINAL BRUSH BIOPSY MEDICATIONS: None ANESTHESIA/SEDATION: Moderate (conscious) sedation was employed during this procedure. A total of Versed 2.5 mg and Fentanyl 125 mcg was administered intravenously. Moderate Sedation Time: 25 minutes. The patient's level of consciousness and vital signs were monitored continuously by radiology nursing throughout the procedure under my direct supervision. FLUOROSCOPY TIME:  Fluoroscopy Time: 6 minutes 18 seconds (192 mGy). COMPLICATIONS: None PROCEDURE: Informed written consent was obtained from the patient after a thorough discussion of the procedural risks, benefits and alternatives. All questions were addressed. Maximal Sterile Barrier Technique was utilized including caps, mask, sterile gowns, sterile gloves, sterile drape, hand hygiene and skin antiseptic. A timeout was performed prior to the initiation of the procedure. Patient positioned supine position on the fluoroscopy table. Patient is prepped and draped in the usual sterile fashion. Scout images were acquired of the upper abdomen. Through the tube cholangiogram was performed demonstrating withdrawal of the previously existing 10 [place] biliary drain. A Glidewire was navigated into the duodenum through the existing tract. Drain was removed from the [employer]. A short Kumpe the catheter was then navigated into the duodenum. Rose in wire was placed through the Kumpe the catheter and the Kumpe the catheter was withdrawn. A 7 French 35 cm bright tip sheath was then placed over the Rose an wire into the biliary system. Introducer was  withdrawn and a pull-back cholangiogram was performed. The targeted narrowing was just cephalad of the clips in  the liver hilum, at a region of ductal narrowing in the presumed area of tumor. Introducer was placed and the sheath was then again pushed beyond the occlusion. Coaxial cytology brush biopsy kit was advanced through the sheath, parallel to the safety wire. The sheath was then withdrawn and the brush biopsy kit was withdrawn into the region of narrowing with agitation across the lesion. Brush was then withdrawn placed into saline comp for cytology. This biopsy was then repeated for a total of 2 brush biopsies. The sheath was then withdrawn and we attempted to place a 12 [place] biliary drain over the Rose an wire. The tract required 12 French dilation. The 12 French drain was again attempted to place over the [facility] an wire, unsuccessful given the angle of approach through the abdominal wall. Kumpe the catheter was replaced to the duodenum and the Rose an wire was exchanged for stiff Amplatz 160 cm wire. We were then successful with placing the 12 [place] biliary drain over the wire into the duodenum, with the proximal side holes positioned just proximal to the presumed site of occlusion/tumor. Drain was sutured in position.  Final images were stored. Gravity drain was attached. Patient tolerated the procedure well and remained hemodynamically stable throughout. No complications were encountered and no significant blood loss. IMPRESSION: Status post routine exchange and up sizing of PTC/drainage with a new 12 French drain placed. 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 Signed, Dulcy Fanny. Dellia Nims, RPVI [facility] Radiology Electronically Signed   By: Corrie Mckusick D.O.   On: 02/09/2018 10:41      Subjective: Intermittent ongoing nausea.  Fluctuating oral intake.  Mostly drinks Ensure.  No abdominal  pain, fever or chills.  No chest pain, dyspnea reported.  Discharge Exam:  Vitals:   02/11/18 1717 02/11/18 2057 02/11/18 2132 02/12/18 0539  BP: (!) 151/60 139/60  (!) 148/61  Pulse: 66 68  71  Resp: 18 18  (!) 22  Temp: 98.1 F (36.7 C) 98.2 F (36.8 C)  97.8 F (36.6 C)  TempSrc: Oral Oral  Oral  SpO2: 98% 99% 98% 100%  Weight:  108.7 kg    Height:        General exam: Pleasant elderly female, moderately built and morbidly obese lying comfortably supine in bed.  Does not appear in any distress. Respiratory system: Clear to auscultation. Respiratory effort normal. Cardiovascular system: S1 & S2 heard, RRR. No JVD, murmurs, rubs, gallops or clicks. No pedal edema.  Gastrointestinal system: Abdomen is nondistended, soft and nontender. No organomegaly or masses felt. Normal bowel sounds heard.  Right biliary drain intact without acute findings.  Central nervous system: Alert and oriented x2. No focal neurological deficits. Extremities: Symmetric 5 x 5 power.  Splint on right index finger. Skin: No rashes, lesions or ulcers Psychiatry: Judgement and insight appear normal. Mood & affect appropriate.    The results of significant diagnostics from this hospitalization (including imaging, microbiology, ancillary and laboratory) are listed below for reference.     Microbiology: Recent Results (from the past 240 hour(s))  Culture, Urine     Status: None   Collection Time: 02/06/18 10:12 AM  Result Value Ref Range Status   Specimen Description URINE, RANDOM  Final   Special Requests NONE  Final   Culture   Final    NO GROWTH Performed at [facility], [address] [place], [place] [address]  Report Status 02/07/2018 FINAL  Final  Culture, blood (routine x 2)     Status: None   Collection Time: 02/07/18  8:12 PM  Result Value Ref Range Status   Specimen Description BLOOD RIGHT HAND  Final   Special Requests   Final    BOTTLES DRAWN AEROBIC AND ANAEROBIC Blood Culture  adequate volume   Culture   Final    NO GROWTH 5 DAYS Performed at [facility], [address], [place], [place] [address]    Report Status 02/12/2018 FINAL  Final  Culture, blood (routine x 2)     Status: None   Collection Time: 02/07/18  9:34 PM  Result Value Ref Range Status   Specimen Description BLOOD LEFT HAND  Final   Special Requests   Final    BOTTLES DRAWN AEROBIC ONLY Blood Culture results may not be optimal due to an inadequate volume of blood received in culture bottles   Culture   Final    NO GROWTH 5 DAYS Performed at [facility], [address] [place], [place] [address]    Report Status 02/12/2018 FINAL  Final     Labs: CBC: Recent Labs  Lab 02/06/18 0641 02/07/18 0359 02/08/18 0531 02/09/18 1136 02/10/18 0642  WBC 12.5* 11.1* 25.0* 17.0* 12.1*  NEUTROABS  --  7.8* 24.8* 15.2* 10.1*  HGB 10.3* 10.4* 10.2* 10.5* 10.3*  HCT 32.8* 33.6* 34.3* 34.0* 34.3*  MCV 91.4 92.8 94.2 93.2 94.8  PLT 417* 471* 438* 500* 099*   Basic Metabolic Panel: Recent Labs  Lab 02/06/18 0839 02/07/18 0359 02/08/18 0531 02/09/18 1136 02/10/18 0642  NA 136 136 134* 138 138  K 4.4 4.7 4.6 4.1 4.3  CL 101 100 100 101 101  CO2 25 27 20* 27 27  GLUCOSE 142* 92 154* 111* 82  BUN 16 22 32* 40* 37*  CREATININE 0.94 0.90 1.23* 1.04* 0.87  CALCIUM 8.8* 8.8* 8.5* 8.8* 8.8*  MG 1.8 2.0 2.1 2.4 2.2  PHOS 4.3 4.3 5.1* 3.8 4.9*   Liver Function Tests: Recent Labs  Lab 02/08/18 0531 02/09/18 1136 02/10/18 0642 02/11/18 0714 02/12/18 0628  AST 145* 72* 78* 60* 59*  ALT 245* 179* 171* 158* 151*  ALKPHOS 496* 424* 358* 347* 340*  BILITOT 3.2* 2.7* 2.5* 2.4* 2.0*  PROT 6.4* 6.6 6.1* 6.3* 6.7  ALBUMIN 2.3* 2.3* 2.2* 2.2* 2.6*   Urinalysis    Component Value Date/Time   COLORURINE AMBER (A) 02/06/2018 1449   APPEARANCEUR CLEAR 02/06/2018 1449   LABSPEC 1.015 02/06/2018 1449   PHURINE 5.0 02/06/2018 1449   GLUCOSEU NEGATIVE 02/06/2018 1449   HGBUR NEGATIVE  02/06/2018 1449   BILIRUBINUR NEGATIVE 02/06/2018 1449   KETONESUR NEGATIVE 02/06/2018 1449   PROTEINUR NEGATIVE 02/06/2018 1449   UROBILINOGEN 0.2 09/19/2012 2302   NITRITE NEGATIVE 02/06/2018 1449   LEUKOCYTESUR NEGATIVE 02/06/2018 1449      Time coordinating discharge: 60 minutes  SIGNED:  Vernell Leep, MD, FACP, [facility]. [facility] Pager [phone]  If 7PM-7AM, please contact night-coverage [email or web address] Password [facility] 02/12/2018, 4:49 PM

## 2018-02-12 NOTE — Telephone Encounter (Signed)
Called patient per 1/9 sch message - unable to reach patient and unable to leave message - sent reminder letter in the mail with appt date and time

## 2018-02-12 NOTE — Progress Notes (Signed)
ANTICOAGULATION CONSULT NOTE   Pharmacy Consult for Heparin Indication: atrial fibrillation  Patient Measurements: Height: 5\' 6"  (167.6 cm) Weight: 239 lb 10.2 oz (108.7 kg) IBW/kg (Calculated) : 59.3 Heparin Dosing Weight: 84.8 kg  Vital Signs: Temp: 97.8 F (36.6 C) (01/10 0539) Temp Source: Oral (01/10 0539) BP: 148/61 (01/10 0539) Pulse Rate: 71 (01/10 0539)  Labs: Recent Labs    02/09/18 1136 02/09/18 2236  02/10/18 8182 02/10/18 0950 02/11/18 0714 02/12/18 0628  HGB 10.5*  --   --  10.3*  --   --   --   HCT 34.0*  --   --  34.3*  --   --   --   PLT 500*  --   --  447*  --   --   --   APTT  --  35  --   --   --   --   --   HEPARINUNFRC  --   --    < > 0.32 0.31 0.57 0.42  CREATININE 1.04*  --   --  0.87  --   --   --    < > = values in this interval not displayed.    Estimated Creatinine Clearance: 62.3 mL/min (by C-G formula based on SCr of 0.87 mg/dL).  Assessment: 85 yo F presents with incidental finding of hepatic mass. On Xarelto PTA for Afib. Holding for biliary drain and liver biopsy. Last dose was 12/29 in the am. Now s/p biliary drain placement and liver biopsy.  Heparin level this morning remains therapeutic (HL 0.42 << 0.57, goal of 0.3-0.7). No CBC since 1/8 - stable at that time. No overt bleeding noted.   Goal of Therapy:  Heparin level 0.3-0.7 units/mL APTT 66-102 secs Monitor platelets by anticoagulation protocol: Yes   Plan:  - Continue heparin at 1300 units/hr - F/u restart of Xarelto - Will continue to monitor for any signs/symptoms of bleeding and will follow up with heparin level in the a.m.   Thank you for allowing pharmacy to be a part of this patient's care.  Alycia Rossetti, PharmD, BCPS Clinical Pharmacist Pager: [phone] Clinical phone for 02/12/2018 from 7a-3:30p: [phone] If after 3:30p, please call main pharmacy at: [phone] Please check AMION for all [place] numbers 02/12/2018 10:18 AM

## 2018-02-12 NOTE — Progress Notes (Addendum)
  Request seen for image guided liver biopsy.  Images reviewed by Dr. Laurence Ferrari.  The liver lesion is NOT amenable to percutaneous biopsy.  When she returns to IR for biliary drain exchange, we can repeat brushings at that time.  Nakema Fake S Seona Clemenson PA-C 02/12/2018 3:31 PM

## 2018-02-15 ENCOUNTER — Other Ambulatory Visit: Payer: Self-pay | Admitting: Oncology

## 2018-02-15 ENCOUNTER — Other Ambulatory Visit (HOSPITAL_COMMUNITY): Payer: Self-pay | Admitting: Physician Assistant

## 2018-02-15 DIAGNOSIS — K831 Obstruction of bile duct: Secondary | ICD-10-CM

## 2018-02-15 DIAGNOSIS — R7989 Other specified abnormal findings of blood chemistry: Secondary | ICD-10-CM

## 2018-02-15 DIAGNOSIS — S63250D Unspecified dislocation of right index finger, subsequent encounter: Secondary | ICD-10-CM | POA: Diagnosis not present

## 2018-02-15 DIAGNOSIS — I5033 Acute on chronic diastolic (congestive) heart failure: Secondary | ICD-10-CM | POA: Diagnosis not present

## 2018-02-15 DIAGNOSIS — R17 Unspecified jaundice: Secondary | ICD-10-CM

## 2018-02-15 DIAGNOSIS — R945 Abnormal results of liver function studies: Secondary | ICD-10-CM

## 2018-02-15 DIAGNOSIS — R296 Repeated falls: Secondary | ICD-10-CM | POA: Diagnosis not present

## 2018-02-16 ENCOUNTER — Emergency Department (HOSPITAL_COMMUNITY): Payer: PPO

## 2018-02-16 ENCOUNTER — Inpatient Hospital Stay (HOSPITAL_COMMUNITY)
Admission: EM | Admit: 2018-02-16 | Discharge: 2018-02-23 | DRG: 682 | Disposition: A | Discharge status: Skilled Nursing Facility | Payer: PPO | Attending: Family Medicine | Admitting: Family Medicine

## 2018-02-16 ENCOUNTER — Other Ambulatory Visit: Payer: Self-pay

## 2018-02-16 ENCOUNTER — Inpatient Hospital Stay (HOSPITAL_COMMUNITY): Payer: PPO

## 2018-02-16 ENCOUNTER — Encounter (HOSPITAL_COMMUNITY): Payer: Self-pay | Admitting: Emergency Medicine

## 2018-02-16 DIAGNOSIS — Z90710 Acquired absence of both cervix and uterus: Secondary | ICD-10-CM

## 2018-02-16 DIAGNOSIS — Z66 Do not resuscitate: Secondary | ICD-10-CM | POA: Diagnosis not present

## 2018-02-16 DIAGNOSIS — R339 Retention of urine, unspecified: Secondary | ICD-10-CM | POA: Diagnosis not present

## 2018-02-16 DIAGNOSIS — E876 Hypokalemia: Secondary | ICD-10-CM | POA: Diagnosis not present

## 2018-02-16 DIAGNOSIS — K831 Obstruction of bile duct: Secondary | ICD-10-CM

## 2018-02-16 DIAGNOSIS — Z885 Allergy status to narcotic agent status: Secondary | ICD-10-CM

## 2018-02-16 DIAGNOSIS — N179 Acute kidney failure, unspecified: Secondary | ICD-10-CM | POA: Diagnosis not present

## 2018-02-16 DIAGNOSIS — I130 Hypertensive heart and chronic kidney disease with heart failure and stage 1 through stage 4 chronic kidney disease, or unspecified chronic kidney disease: Secondary | ICD-10-CM | POA: Diagnosis present

## 2018-02-16 DIAGNOSIS — N178 Other acute kidney failure: Secondary | ICD-10-CM | POA: Diagnosis not present

## 2018-02-16 DIAGNOSIS — S40219A Abrasion of unspecified shoulder, initial encounter: Secondary | ICD-10-CM

## 2018-02-16 DIAGNOSIS — R160 Hepatomegaly, not elsewhere classified: Secondary | ICD-10-CM | POA: Diagnosis not present

## 2018-02-16 DIAGNOSIS — H3530 Unspecified macular degeneration: Secondary | ICD-10-CM | POA: Diagnosis present

## 2018-02-16 DIAGNOSIS — C253 Malignant neoplasm of pancreatic duct: Secondary | ICD-10-CM | POA: Diagnosis not present

## 2018-02-16 DIAGNOSIS — R2681 Unsteadiness on feet: Secondary | ICD-10-CM | POA: Diagnosis not present

## 2018-02-16 DIAGNOSIS — I5032 Chronic diastolic (congestive) heart failure: Secondary | ICD-10-CM | POA: Diagnosis present

## 2018-02-16 DIAGNOSIS — Z7951 Long term (current) use of inhaled steroids: Secondary | ICD-10-CM

## 2018-02-16 DIAGNOSIS — Z6839 Body mass index (BMI) 39.0-39.9, adult: Secondary | ICD-10-CM | POA: Diagnosis not present

## 2018-02-16 DIAGNOSIS — I480 Paroxysmal atrial fibrillation: Secondary | ICD-10-CM | POA: Diagnosis present

## 2018-02-16 DIAGNOSIS — R198 Other specified symptoms and signs involving the digestive system and abdomen: Secondary | ICD-10-CM | POA: Diagnosis not present

## 2018-02-16 DIAGNOSIS — R2689 Other abnormalities of gait and mobility: Secondary | ICD-10-CM | POA: Diagnosis not present

## 2018-02-16 DIAGNOSIS — J449 Chronic obstructive pulmonary disease, unspecified: Secondary | ICD-10-CM | POA: Diagnosis present

## 2018-02-16 DIAGNOSIS — N170 Acute kidney failure with tubular necrosis: Principal | ICD-10-CM | POA: Diagnosis present

## 2018-02-16 DIAGNOSIS — M2550 Pain in unspecified joint: Secondary | ICD-10-CM | POA: Diagnosis not present

## 2018-02-16 DIAGNOSIS — R627 Adult failure to thrive: Secondary | ICD-10-CM | POA: Diagnosis not present

## 2018-02-16 DIAGNOSIS — E875 Hyperkalemia: Secondary | ICD-10-CM

## 2018-02-16 DIAGNOSIS — Z8701 Personal history of pneumonia (recurrent): Secondary | ICD-10-CM

## 2018-02-16 DIAGNOSIS — S63259A Unspecified dislocation of unspecified finger, initial encounter: Secondary | ICD-10-CM

## 2018-02-16 DIAGNOSIS — D72829 Elevated white blood cell count, unspecified: Secondary | ICD-10-CM | POA: Diagnosis not present

## 2018-02-16 DIAGNOSIS — Z888 Allergy status to other drugs, medicaments and biological substances status: Secondary | ICD-10-CM

## 2018-02-16 DIAGNOSIS — C221 Intrahepatic bile duct carcinoma: Secondary | ICD-10-CM | POA: Diagnosis not present

## 2018-02-16 DIAGNOSIS — Z9049 Acquired absence of other specified parts of digestive tract: Secondary | ICD-10-CM

## 2018-02-16 DIAGNOSIS — Z853 Personal history of malignant neoplasm of breast: Secondary | ICD-10-CM

## 2018-02-16 DIAGNOSIS — R944 Abnormal results of kidney function studies: Secondary | ICD-10-CM | POA: Diagnosis not present

## 2018-02-16 DIAGNOSIS — M1811 Unilateral primary osteoarthritis of first carpometacarpal joint, right hand: Secondary | ICD-10-CM | POA: Diagnosis not present

## 2018-02-16 DIAGNOSIS — Z4682 Encounter for fitting and adjustment of non-vascular catheter: Secondary | ICD-10-CM

## 2018-02-16 DIAGNOSIS — R0902 Hypoxemia: Secondary | ICD-10-CM | POA: Diagnosis not present

## 2018-02-16 DIAGNOSIS — E871 Hypo-osmolality and hyponatremia: Secondary | ICD-10-CM | POA: Diagnosis present

## 2018-02-16 DIAGNOSIS — I4891 Unspecified atrial fibrillation: Secondary | ICD-10-CM | POA: Diagnosis present

## 2018-02-16 DIAGNOSIS — I482 Chronic atrial fibrillation, unspecified: Secondary | ICD-10-CM | POA: Diagnosis not present

## 2018-02-16 DIAGNOSIS — Z981 Arthrodesis status: Secondary | ICD-10-CM

## 2018-02-16 DIAGNOSIS — Z87891 Personal history of nicotine dependence: Secondary | ICD-10-CM

## 2018-02-16 DIAGNOSIS — N183 Chronic kidney disease, stage 3 (moderate): Secondary | ICD-10-CM | POA: Diagnosis not present

## 2018-02-16 DIAGNOSIS — K219 Gastro-esophageal reflux disease without esophagitis: Secondary | ICD-10-CM | POA: Diagnosis present

## 2018-02-16 DIAGNOSIS — I447 Left bundle-branch block, unspecified: Secondary | ICD-10-CM | POA: Diagnosis not present

## 2018-02-16 DIAGNOSIS — Z96641 Presence of right artificial hip joint: Secondary | ICD-10-CM | POA: Diagnosis not present

## 2018-02-16 DIAGNOSIS — M6281 Muscle weakness (generalized): Secondary | ICD-10-CM | POA: Diagnosis not present

## 2018-02-16 DIAGNOSIS — E861 Hypovolemia: Secondary | ICD-10-CM | POA: Diagnosis present

## 2018-02-16 DIAGNOSIS — R110 Nausea: Secondary | ICD-10-CM | POA: Diagnosis not present

## 2018-02-16 DIAGNOSIS — Z79899 Other long term (current) drug therapy: Secondary | ICD-10-CM

## 2018-02-16 DIAGNOSIS — E872 Acidosis: Secondary | ICD-10-CM | POA: Diagnosis present

## 2018-02-16 DIAGNOSIS — Z7901 Long term (current) use of anticoagulants: Secondary | ICD-10-CM

## 2018-02-16 DIAGNOSIS — Z9181 History of falling: Secondary | ICD-10-CM

## 2018-02-16 DIAGNOSIS — Z8249 Family history of ischemic heart disease and other diseases of the circulatory system: Secondary | ICD-10-CM

## 2018-02-16 DIAGNOSIS — R278 Other lack of coordination: Secondary | ICD-10-CM | POA: Diagnosis not present

## 2018-02-16 DIAGNOSIS — J9611 Chronic respiratory failure with hypoxia: Secondary | ICD-10-CM | POA: Diagnosis not present

## 2018-02-16 DIAGNOSIS — Z9981 Dependence on supplemental oxygen: Secondary | ICD-10-CM

## 2018-02-16 DIAGNOSIS — I959 Hypotension, unspecified: Secondary | ICD-10-CM | POA: Diagnosis not present

## 2018-02-16 DIAGNOSIS — R41841 Cognitive communication deficit: Secondary | ICD-10-CM | POA: Diagnosis not present

## 2018-02-16 DIAGNOSIS — R112 Nausea with vomiting, unspecified: Secondary | ICD-10-CM | POA: Diagnosis not present

## 2018-02-16 DIAGNOSIS — Z79891 Long term (current) use of opiate analgesic: Secondary | ICD-10-CM

## 2018-02-16 DIAGNOSIS — Z803 Family history of malignant neoplasm of breast: Secondary | ICD-10-CM

## 2018-02-16 DIAGNOSIS — R945 Abnormal results of liver function studies: Secondary | ICD-10-CM | POA: Diagnosis not present

## 2018-02-16 DIAGNOSIS — N19 Unspecified kidney failure: Secondary | ICD-10-CM | POA: Diagnosis not present

## 2018-02-16 DIAGNOSIS — Z7401 Bed confinement status: Secondary | ICD-10-CM | POA: Diagnosis not present

## 2018-02-16 DIAGNOSIS — C229 Malignant neoplasm of liver, not specified as primary or secondary: Secondary | ICD-10-CM | POA: Diagnosis not present

## 2018-02-16 DIAGNOSIS — Z886 Allergy status to analgesic agent status: Secondary | ICD-10-CM

## 2018-02-16 LAB — CBC WITH DIFFERENTIAL/PLATELET
Abs Immature Granulocytes: 0.75 10*3/uL — ABNORMAL HIGH (ref 0.00–0.07)
BASOS ABS: 0.1 10*3/uL (ref 0.0–0.1)
Basophils Relative: 0 %
Eosinophils Absolute: 0 10*3/uL (ref 0.0–0.5)
Eosinophils Relative: 0 %
HCT: 44.3 % (ref 36.0–46.0)
HEMOGLOBIN: 13.9 g/dL (ref 12.0–15.0)
Immature Granulocytes: 3 %
LYMPHS ABS: 0.7 10*3/uL (ref 0.7–4.0)
Lymphocytes Relative: 2 %
MCH: 28.7 pg (ref 26.0–34.0)
MCHC: 31.4 g/dL (ref 30.0–36.0)
MCV: 91.3 fL (ref 80.0–100.0)
Monocytes Absolute: 0.8 10*3/uL (ref 0.1–1.0)
Monocytes Relative: 3 %
NRBC: 0 % (ref 0.0–0.2)
Neutro Abs: 26 10*3/uL — ABNORMAL HIGH (ref 1.7–7.7)
Neutrophils Relative %: 92 %
Platelets: 570 10*3/uL — ABNORMAL HIGH (ref 150–400)
RBC: 4.85 MIL/uL (ref 3.87–5.11)
RDW: 17.5 % — ABNORMAL HIGH (ref 11.5–15.5)
WBC: 28.3 10*3/uL — ABNORMAL HIGH (ref 4.0–10.5)

## 2018-02-16 LAB — COMPREHENSIVE METABOLIC PANEL
ALT: 501 U/L — ABNORMAL HIGH (ref 0–44)
AST: 372 U/L — AB (ref 15–41)
Albumin: 3.6 g/dL (ref 3.5–5.0)
Alkaline Phosphatase: 325 U/L — ABNORMAL HIGH (ref 38–126)
Anion gap: 22 — ABNORMAL HIGH (ref 5–15)
BUN: 100 mg/dL — ABNORMAL HIGH (ref 8–23)
CO2: 18 mmol/L — ABNORMAL LOW (ref 22–32)
Calcium: 8.9 mg/dL (ref 8.9–10.3)
Chloride: 88 mmol/L — ABNORMAL LOW (ref 98–111)
Creatinine, Ser: 5.29 mg/dL — ABNORMAL HIGH (ref 0.44–1.00)
GFR calc Af Amer: 8 mL/min — ABNORMAL LOW (ref >60)
GFR, EST NON AFRICAN AMERICAN: 7 mL/min — AB (ref >60)
Glucose, Bld: 122 mg/dL — ABNORMAL HIGH (ref 70–99)
Potassium: 6.1 mmol/L — ABNORMAL HIGH (ref 3.5–5.1)
Sodium: 128 mmol/L — ABNORMAL LOW (ref 135–145)
Total Bilirubin: 2.5 mg/dL — ABNORMAL HIGH (ref 0.3–1.2)
Total Protein: 8.4 g/dL — ABNORMAL HIGH (ref 6.5–8.1)

## 2018-02-16 LAB — I-STAT CG4 LACTIC ACID, ED
LACTIC ACID, VENOUS: 1.7 mmol/L (ref 0.5–1.9)
LACTIC ACID, VENOUS: 1.21 mmol/L (ref 0.5–1.9)

## 2018-02-16 LAB — PROTIME-INR
INR: 1.49
Prothrombin Time: 17.9 seconds — ABNORMAL HIGH (ref 11.4–15.2)

## 2018-02-16 MED ORDER — ORAL CARE MOUTH RINSE
15.0000 mL | Freq: Two times a day (BID) | OROMUCOSAL | Status: DC
  Administered 2018-02-17 – 2018-02-23 (×8): 15 mL via OROMUCOSAL

## 2018-02-16 MED ORDER — SODIUM CHLORIDE 0.9 % IV BOLUS
500.0000 mL | Freq: Once | INTRAVENOUS | Status: AC
  Administered 2018-02-16: 500 mL via INTRAVENOUS

## 2018-02-16 MED ORDER — SODIUM ZIRCONIUM CYCLOSILICATE 10 G PO PACK
10.0000 g | PACK | Freq: Two times a day (BID) | ORAL | Status: DC
  Administered 2018-02-16 – 2018-02-18 (×2): 10 g via ORAL
  Filled 2018-02-16 (×4): qty 1

## 2018-02-16 MED ORDER — SODIUM BICARBONATE 8.4 % IV SOLN
INTRAVENOUS | Status: DC
  Administered 2018-02-16 – 2018-02-18 (×4): via INTRAVENOUS
  Filled 2018-02-16 (×4): qty 150

## 2018-02-16 NOTE — ED Notes (Signed)
Unsuccessful blood culture attempt. Patient has restricted left arm and is a difficult stick. Main phlebotomy asked to attempt.

## 2018-02-16 NOTE — ED Notes (Signed)
Main phlebotomy at bedside.

## 2018-02-16 NOTE — ED Notes (Signed)
ED TO INPATIENT HANDOFF REPORT  Name/Age/Gender Jordan Byrd 85 y.o. female  Code Status Code Status History    Date Active Date Inactive Code Status Order ID Comments User Context   02/01/2018 1539 02/13/2018 0037 DNR [id number]  Karmen Bongo, MD ED   02/09/2015 1958 02/15/2015 1511 Full Code [id number]  Vianne Bulls, MD Inpatient   05/23/2014 1850 05/27/2014 2010 Full Code [id number]  Florencia Reasons, MD ED   05/13/2014 1408 05/15/2014 1457 DNR [id number]  Caren Griffins, MD ED   09/19/2012 2100 09/22/2012 1636 Full Code [id number]  Orvan Falconer, MD Inpatient   07/12/2012 2018 07/16/2012 1458 Full Code [id number]  Sherren Mocha, MD Inpatient    Questions for Most Recent Historical Code Status (Order [id number])    Question Answer Comment   In the event of cardiac or respiratory ARREST Do not call a "code blue"    In the event of cardiac or respiratory ARREST Do not perform Intubation, CPR, defibrillation or ACLS    In the event of cardiac or respiratory ARREST Use medication by any route, position, wound care, and other measures to relive pain and suffering. May use oxygen, suction and manual treatment of airway obstruction as needed for comfort.         Advance Directive Documentation     Most Recent Value  Type of Advance Directive  Healthcare Power of [place], Out of facility DNR (pink MOST or yellow form)  Pre-existing out of facility DNR order (yellow form or pink MOST form)  -  "MOST" Form in Place?  -      Home/SNF/Other Skilled nursing facility  Chief Complaint Abnormal labs  Level of Care/Admitting Diagnosis ED Disposition    ED Disposition Condition [place]: [place] [[id number]]  Level of Care: Telemetry [5]  Admit to tele based on following criteria: Monitor for Ischemic changes  Diagnosis: ARF (acute renal failure) [facility]) [[id number]]  Admitting Physician: Rise Patience [phone]  Attending Physician: Rise Patience [phone]  Estimated length of stay: past midnight tomorrow  Certification:: I certify this patient will need inpatient services for at least 2 midnights  PT Class (Do Not Modify): Inpatient [101]  PT Acc Code (Do Not Modify): Private [1]       Medical History Past Medical History:  Diagnosis Date  . Anemia   . Arthritis    "all over" (02/01/2018)  . Asthma   . Atrial fibrillation ([place])    on Xarelto  . Breast cancer, left breast ([place]) 05/19/13   left breast stage IIA   . CHF (congestive heart failure) ([place])   . COPD (chronic obstructive pulmonary disease) ([place])   . GERD (gastroesophageal reflux disease)    [name] Prilosec  . Hypertension   . Macular degeneration   . Obstructive jaundice 02/01/2018  . On home oxygen therapy    "2.5L prn" (02/01/2018)  . Osteoarthritis    a. 03/28/11 - R Total Hip Arthroplasty  . Pleurisy   . Pneumonia    "2-3 times" (02/01/2018)    Allergies Allergies  Allergen Reactions  . Methocarbamol Hives  . Vicodin [Hydrocodone-Acetaminophen] Other (See Comments)    Interacts with bp medication and drops blood pressure  . Aspirin Nausea And Vomiting  . Butazolidin [Phenylbutazone] Other (See Comments)    Unknown reaction  . Celebrex [Celecoxib] Nausea And Vomiting  . Codeine Nausea And Vomiting  . Doripenem Rash  . Aspirin Buf(Alhyd-Mghyd-Cacar) Nausea And Vomiting  . Mucinex [  Guaifenesin Er] Itching  . Temazepam Other (See Comments)    Unknown reaction    IV Location/Drains/Wounds Patient Lines/Drains/Airways Status   Active Line/Drains/Airways    Name:   Placement date:   Placement time:   Site:   Days:   Peripheral IV 02/10/18 Right;Lateral Forearm   02/10/18    0224    Forearm   6   Peripheral IV 02/16/18 Right;Upper Arm   02/16/18    1801    Arm   less than 1   Biliary Tube Cook slip-coat 12 Fr. RUQ   02/09/18    1008    RUQ   7   External Urinary Catheter   02/01/18    -    -   15   Incision (Closed) 07/14/13 Breast Left   07/14/13    1146      1678   Incision (Closed) 02/09/18 Abdomen Right   02/09/18    1023     7          Labs/Imaging Results for orders placed or performed during the hospital encounter of 02/16/18 (from the past 48 hour(s))  Comprehensive metabolic panel     Status: Abnormal   Collection Time: 02/16/18  6:20 PM  Result Value Ref Range   Sodium 128 (L) 135 - 145 mmol/L   Potassium 6.1 (H) 3.5 - 5.1 mmol/L    Comment: NO VISIBLE HEMOLYSIS   Chloride 88 (L) 98 - 111 mmol/L   CO2 18 (L) 22 - 32 mmol/L   Glucose, Bld 122 (H) 70 - 99 mg/dL   BUN 100 (H) 8 - 23 mg/dL   Creatinine, Ser 5.29 (H) 0.44 - 1.00 mg/dL   Calcium 8.9 8.9 - 10.3 mg/dL   Total Protein 8.4 (H) 6.5 - 8.1 g/dL   Albumin 3.6 3.5 - 5.0 g/dL   AST 372 (H) 15 - 41 U/L   ALT 501 (H) 0 - 44 U/L   Alkaline Phosphatase 325 (H) 38 - 126 U/L   Total Bilirubin 2.5 (H) 0.3 - 1.2 mg/dL   GFR calc non Af Amer 7 (L) >60 mL/min   GFR calc Af Amer 8 (L) >60 mL/min   Anion gap 22 (H) 5 - 15    Comment: REPEATED TO VERIFY Performed at [facility], [place] [address], [place], [place] [address]   Protime-INR     Status: Abnormal   Collection Time: 02/16/18  6:20 PM  Result Value Ref Range   Prothrombin Time 17.9 (H) 11.4 - 15.2 seconds   INR 1.49     Comment: Performed at [facility], [place] [address] [place],  [address]  CBC with Differential     Status: Abnormal   Collection Time: 02/16/18  6:20 PM  Result Value Ref Range   WBC 28.3 (H) 4.0 - 10.5 K/uL   RBC 4.85 3.87 - 5.11 MIL/uL   Hemoglobin 13.9 12.0 - 15.0 g/dL   HCT 44.3 36.0 - 46.0 %   MCV 91.3 80.0 - 100.0 fL   MCH 28.7 26.0 - 34.0 pg   MCHC 31.4 30.0 - 36.0 g/dL   RDW 17.5 (H) 11.5 - 15.5 %   Platelets 570 (H) 150 - 400 K/uL   nRBC 0.0 0.0 - 0.2 %   Neutrophils Relative % 92 %   Neutro Abs 26.0 (H) 1.7 - 7.7 K/uL   Lymphocytes Relative 2 %   Lymphs Abs 0.7 0.7 - 4.0 K/uL   Monocytes  Relative 3 %   Monocytes Absolute 0.8 0.1 - 1.0  K/uL   Eosinophils Relative 0 %   Eosinophils Absolute 0.0 0.0 - 0.5 K/uL   Basophils Relative 0 %   Basophils Absolute 0.1 0.0 - 0.1 K/uL   WBC Morphology WHITE COUNT CONFIRMED ON SMEAR    Immature Granulocytes 3 %   Abs Immature Granulocytes 0.75 (H) 0.00 - 0.07 K/uL    Comment: Performed at [facility], [place] [address], [place], [place] [address]  I-Stat CG4 Lactic Acid, ED     Status: None   Collection Time: 02/16/18  6:23 PM  Result Value Ref Range   Lactic Acid, Venous 1.70 0.5 - 1.9 mmol/L  I-Stat CG4 Lactic Acid, ED     Status: None   Collection Time: 02/16/18  8:27 PM  Result Value Ref Range   Lactic Acid, Venous 1.21 0.5 - 1.9 mmol/L   Dg Abdomen Acute W/chest  Result Date: 02/16/2018 CLINICAL DATA:  Nausea, vomiting, diarrhea and abdominal pain. EXAM: DG ABDOMEN ACUTE W/ 1V CHEST COMPARISON:  Portable chest dated 02/08/2018. Chest CTA and abdomen and pelvis CT dated 06/2018. Abdomen MR dated 02/12/2018. FINDINGS: Normal sized heart. Clear lungs with normal vascularity. Left breast surgical clips. Cervical spine fixation hardware. Normal bowel gas pattern without free peritoneal air. Stable percutaneous biliary drainage catheter and right hip prosthesis. Lumbar and thoracic spine degenerative changes. IMPRESSION: No acute abnormality. Electronically Signed   By: Claudie Revering M.D.   On: 02/16/2018 17:35   Ct Renal Stone Study  Result Date: 02/16/2018 CLINICAL DATA:  Flank pain. Renal insufficiency. Nausea and vomiting. EXAM: CT ABDOMEN AND PELVIS WITHOUT CONTRAST TECHNIQUE: Multidetector CT imaging of the abdomen and pelvis was performed following the standard protocol without IV contrast. COMPARISON:  Chest, abdomen and pelvis radiographs obtained earlier today. Chest CTA and abdomen and pelvis CT dated 02/07/2018. Abdomen and pelvis CT dated 02/01/2018. FINDINGS: Lower chest: Multiple small nodules at both lung bases. The largest is in the right lower lobe,  measuring 5 mm on image number 26 series 4. This was not present on the previous examinations and multiple other nodules are also new on both sides. Hepatobiliary: Previously demonstrated percutaneous biliary drainage catheter, poorly defined left lobe liver mass and markedly dilated left lobe bile ducts. Cholecystectomy clips. Pancreas: Unremarkable. No pancreatic ductal dilatation or surrounding inflammatory changes. Spleen: Normal in size without focal abnormality. Adrenals/Urinary Tract: Normal appearing adrenal glands. Small lower pole right renal cyst. Normal appearing left kidney, ureters and urinary bladder. Portions of the urinary bladder and distal ureters are obscured by streak artifacts from a right hip prosthesis. Stomach/Bowel: Surgically absent appendix. Unremarkable stomach, small bowel and colon. Vascular/Lymphatic: Atheromatous arterial calcifications without aneurysm. No enlarged lymph nodes. Reproductive: Status post hysterectomy. No adnexal masses. Other: No abdominal wall hernia or abnormality. No abdominopelvic ascites. Musculoskeletal: Right hip prosthesis with associated streak artifacts. Lumbar and lower thoracic spine degenerative changes. IMPRESSION: 1. No acute abnormality. 2. Multiple interval small nodules at both lung bases suspicious for metastases. 3. Previously demonstrated left lobe liver mass causing left lobe liver biliary obstruction. Electronically Signed   By: Claudie Revering M.D.   On: 02/16/2018 21:07   EKG Interpretation  Date/Time:  Tuesday February 16 2018 17:36:06 EST Ventricular Rate:  97 PR Interval:    QRS Duration: 112 QT Interval:  347 QTC Calculation: 441 R Axis:   63 Text Interpretation:  Sinus rhythm Incomplete left bundle branch block T waves peaked Reconfirmed  by Davonna Belling [phone]) on 02/16/2018 7:34:23 PM   Pending Labs Unresulted Labs (From admission, onward)    Start     Ordered   02/16/18 1648  Urinalysis, Routine w reflex microscopic   ONCE - STAT,   STAT     02/16/18 1647   02/16/18 1648  Culture, blood (routine x 2)  BLOOD CULTURE X 2,   STAT     02/16/18 1647          Vitals/Pain Today's Vitals   02/16/18 1633 02/16/18 1950 02/16/18 2128  BP: 130/77 (!) 143/76 135/77  Pulse: 80 (!) 127 88  Resp: 20 (!) 22 (!) 25  Temp: (!) 97.4 F (36.3 C)    TempSrc: Oral    SpO2: 97% 90% 100%    Isolation Precautions No active isolations  Medications Medications  sodium zirconium cyclosilicate (LOKELMA) packet 10 g (10 g Oral Given 02/16/18 2124)  sodium chloride 0.9 % bolus 500 mL (0 mLs Intravenous Stopped 02/16/18 1907)  sodium chloride 0.9 % bolus 500 mL (0 mLs Intravenous Stopped 02/16/18 2126)    Mobility walks with person assist

## 2018-02-16 NOTE — ED Provider Notes (Signed)
[facility] DEPT Provider Note   CSN: [id number] Arrival date & time: 02/16/18  1604     History   Chief Complaint Chief Complaint  Patient presents with  . Abnormal Lab    HPI Jordan Byrd is a 85 y.o. female.  HPI Patient presents with lab abnormality.  Creatinine up to 3 potassium up to 5.6.  4 days ago potassium was 5.2 and creatinine was 1.4.  Has had nausea and vomiting.  Decreased oral intake.  Recent admission to the hospital and reportedly was diuresed around 10 L.  Given Zofran by EMS.  States she feels weak.  Has a biliary drain in.  Likely cholangiocarcinoma. Past Medical History:  Diagnosis Date  . Anemia   . Arthritis    "all over" (02/01/2018)  . Asthma   . Atrial fibrillation ([place])    on Xarelto  . Breast cancer, left breast ([place]) 05/19/13   left breast stage IIA   . CHF (congestive heart failure) ([place])   . COPD (chronic obstructive pulmonary disease) ([place])   . GERD (gastroesophageal reflux disease)    [name] Prilosec  . Hypertension   . Macular degeneration   . Obstructive jaundice 02/01/2018  . On home oxygen therapy    "2.5L prn" (02/01/2018)  . Osteoarthritis    a. 03/28/11 - R Total Hip Arthroplasty  . Pleurisy   . Pneumonia    "2-3 times" (02/01/2018)    Patient Active Problem List   Diagnosis Date Noted  . Obstructive jaundice 02/01/2018  . Falls frequently 02/01/2018  . CHF (congestive heart failure) ([place])   . Atrial fibrillation ([place])   . Shoulder pain, left   . COPD exacerbation ([place]) 02/09/2015  . Chronic respiratory failure with hypoxia ([place]) 06/15/2014  . Acute respiratory failure with hypoxia ([place]) 05/24/2014  . Influenza A H1N1 infection 05/14/2014  . COPD with exacerbation ([place]) 05/13/2014  . Sleep-disordered breathing 03/11/2014  . Malignant neoplasm of breast (female), unspecified site 07/06/2013  . Family history of malignant neoplasm of breast 07/06/2013  . Breast cancer of upper-outer  quadrant of left female breast ([place]) 05/20/2013  . Anemia 03/07/2013  . COPD mixed type ([place]) 10/28/2012  . Chest tightness 10/28/2012  . HCAP (healthcare-associated pneumonia) 09/19/2012  . Chronic anticoagulation 09/19/2012  . Acute on chronic diastolic heart failure ([place]) 07/12/2012  . Dizziness 06/26/2012  . CKD (chronic kidney disease) stage 3, GFR 30-59 ml/min (H[facility]) 06/26/2012  . Chronic diastolic heart failure ([place]) 06/26/2012  . AKI (acute kidney injury) ([place]) 06/17/2012  . Acute respiratory distress 06/14/2012  . Acute asthma exacerbation 06/14/2012  . UTI (lower urinary tract infection) 04/04/2011  . Arthritis   . Anxiety   . Hypertension   . Paroxysmal atrial fibrillation [facility])     Past Surgical History:  Procedure Laterality Date  . ABDOMINAL HYSTERECTOMY  1962  . ADENOIDECTOMY  [id number]   "grew back 6 times"  . ANTERIOR CERVICAL DECOMP/DISCECTOMY FUSION    . APPENDECTOMY  1954  . BACK SURGERY    . BREAST LUMPECTOMY WITH NEEDLE LOCALIZATION Left 07/14/2013   Procedure: BREAST LUMPECTOMY WITH NEEDLE LOCALIZATION;  Surgeon: Stark Klein, MD;  Location: [facility];  Service: General;  Laterality: Left;  . BREAST SURGERY    . CARDIAC CATHETERIZATION     1980's or 90's - reportedly normal  . CARDIOVERSION  04/05/2011   Procedure: CARDIOVERSION;  Surgeon: Coralyn Mark, MD;  Location: [facility];  Service: Cardiovascular;  Laterality: N/A;  . CARDIOVERSION  N/A 06/19/2012   Procedure: CARDIOVERSION-bedside(3W36);  Surgeon: Lelon Perla, MD;  Location: [facility] OR;  Service: Cardiovascular;  Laterality: N/A;  . CATARACT EXTRACTION BILATERAL W/ ANTERIOR VITRECTOMY Bilateral 2007/2008  . CERVICAL DISC SURGERY    . CHOLECYSTECTOMY OPEN    . COLONOSCOPY     "I don't know but I ain't gonna have no more"  . DILATION AND CURETTAGE OF UTERUS    . EXCISION / BIOPSY BREAST / NIPPLE / DUCT     left breast  . INCISION AND DRAINAGE Right 1947   "stuck nail in my foot"  . IR ENDOLUMINAL BX OF  BILIARY TREE  02/09/2018  . IR EXCHANGE BILIARY DRAIN  02/09/2018  . IR INT EXT BILIARY DRAIN WITH CHOLANGIOGRAM  02/02/2018  . JOINT REPLACEMENT    . KNEE ARTHROSCOPY Bilateral   . LUMBAR DISC SURGERY  10/30/2008   L2-S1  . PLANTAR'S WART EXCISION Bilateral   . SHOULDER OPEN ROTATOR CUFF REPAIR Left   . TONSILLECTOMY AND ADENOIDECTOMY  1943  . TOTAL HIP ARTHROPLASTY  03/28/2011   Procedure: TOTAL HIP ARTHROPLASTY;  Surgeon: Yvette Rack., MD;  Location: [facility];  Service: Orthopedics;  Laterality: Right;     OB History   No obstetric history on file.    Obstetric Comments  Menses age 47 No pregnancies 3 step-children         Home Medications    Prior to Admission medications   Medication Sig Start Date End Date Taking? Authorizing Provider  albuterol (PROVENTIL HFA;VENTOLIN HFA) 108 (90 BASE) MCG/ACT inhaler Inhale 1 puff into the lungs every 6 (six) hours as needed for wheezing. Reported on 04/10/2015   Yes [provider]  amLODipine (NORVASC) 10 MG tablet Take 10 mg by mouth daily.   Yes [provider]  budesonide-formoterol (SYMBICORT) 160-4.5 MCG/ACT inhaler Inhale 2 puffs into the lungs See admin instructions. Inhale 1 puff every morning, may inhale a 2nd puff in the evening if needed for wheezing   Yes [provider]  feeding supplement, ENSURE ENLIVE, (ENSURE ENLIVE) LIQD Take 237 mLs by mouth 3 (three) times daily between meals. 02/12/18  Yes Hongalgi, Lenis Dickinson, MD  Multiple Vitamin (MULTIVITAMIN WITH MINERALS) TABS tablet Take 1 tablet by mouth daily.   Yes [provider]  Nebivolol HCl (BYSTOLIC) 20 MG TABS Take 1 tablet (20 mg total) by mouth daily. 10/24/15  Yes Allred, Jeneen Rinks, MD  nitroGLYCERIN (NITROSTAT) 0.4 MG SL tablet Place 0.4 mg under the tongue every 5 (five) minutes as needed for chest pain.   Yes [provider]  omeprazole (PRILOSEC) 20 MG capsule Take 20 mg by mouth daily.   Yes [provider]  ondansetron  (ZOFRAN) 4 MG tablet Take 1 tablet (4 mg total) by mouth every 8 (eight) hours as needed for nausea or vomiting. 02/12/18  Yes Hongalgi, Lenis Dickinson, MD  polyethylene glycol (MIRALAX / GLYCOLAX) packet Take 17 g by mouth 2 (two) times daily. 02/12/18  Yes Hongalgi, Lenis Dickinson, MD  rivaroxaban (XARELTO) 20 MG TABS tablet Take 1 tablet (20 mg total) by mouth daily with supper. 10/24/15  Yes Allred, Jeneen Rinks, MD  senna-docusate (SENOKOT-S) 8.6-50 MG tablet Take 1 tablet by mouth 2 (two) times daily. 02/12/18  Yes Hongalgi, Lenis Dickinson, MD  traMADol (ULTRAM) 50 MG tablet Take 1 tablet (50 mg total) by mouth 2 (two) times daily. 02/12/18  Yes Hongalgi, Lenis Dickinson, MD    Family History Family History  Problem Relation Age  of Onset  . Diabetes Father        Father, Mother, 4 sisters (2 living)  . Heart attack Father        (Deceased)  . Heart failure Father        (deceased 13)  . Hypertension Father   . Stroke Mother        (deceased 66)  . Hypertension Mother   . Cancer Sister 36       breast cancer  . Breast cancer Sister 73  . Heart attack Sister   . Diabetes Sister   . Cancer Other 9       niece with breast cancer    Social History Social History   Tobacco Use  . Smoking status: Former Smoker    Packs/day: 1.00    Years: 21.00    Pack years: 21.00    Types: Cigarettes    Last attempt to quit: 08/15/1970    Years since quitting: 47.5  . Smokeless tobacco: Never Used  Substance Use Topics  . Alcohol use: Not Currently    Alcohol/week: 0.0 standard drinks  . Drug use: Never     Allergies   Methocarbamol; Vicodin [hydrocodone-acetaminophen]; Aspirin; Butazolidin [phenylbutazone]; Celebrex [celecoxib]; Codeine; Doripenem; Aspirin buf(alhyd-mghyd-cacar); Mucinex [guaifenesin er]; and Temazepam   Review of Systems Review of Systems  Constitutional: Negative for appetite change.  HENT: Negative for congestion.   Respiratory: Positive for cough. Negative for shortness of breath.     Cardiovascular: Negative for chest pain.  Gastrointestinal: Positive for abdominal pain, nausea and vomiting.  Genitourinary: Negative for flank pain.  Musculoskeletal: Negative for back pain.  Skin: Negative for rash.  Neurological: Positive for weakness.  Psychiatric/Behavioral: Negative for confusion.     Physical Exam Updated Vital Signs BP 130/77 (BP Location: Right Arm)   Pulse 80   Temp (!) 97.4 F (36.3 C) (Oral)   Resp 20   SpO2 97%   Physical Exam HENT:     Head: Normocephalic.     Mouth/Throat:     Mouth: Mucous membranes are dry.  Eyes:     Pupils: Pupils are equal, round, and reactive to light.  Neck:     Musculoskeletal: Neck supple.  Cardiovascular:     Rate and Rhythm: Regular rhythm.  Pulmonary:     Breath sounds: Normal breath sounds.  Abdominal:     Comments: Biliary drain in right upper quadrant.  Mild abdominal tenderness.  Musculoskeletal:        General: No tenderness.  Skin:    General: Skin is warm.     Capillary Refill: Capillary refill takes less than 2 seconds.  Neurological:     Mental Status: She is alert and oriented to person, place, and time.      ED Treatments / Results  Labs (all labs ordered are listed, but only abnormal results are displayed) Labs Reviewed  COMPREHENSIVE METABOLIC PANEL - Abnormal; Notable for the following components:      Result Value   Sodium 128 (*)    Potassium 6.1 (*)    Chloride 88 (*)    CO2 18 (*)    Glucose, Bld 122 (*)    BUN 100 (*)    Creatinine, Ser 5.29 (*)    Total Protein 8.4 (*)    AST 372 (*)    ALT 501 (*)    Alkaline Phosphatase 325 (*)    Total Bilirubin 2.5 (*)    GFR calc non Af Amer 7 (*)  GFR calc Af Amer 8 (*)    Anion gap 22 (*)    All other components within normal limits  PROTIME-INR - Abnormal; Notable for the following components:   Prothrombin Time 17.9 (*)    All other components within normal limits  CBC WITH DIFFERENTIAL/PLATELET - Abnormal; Notable for  the following components:   WBC 28.3 (*)    RDW 17.5 (*)    Platelets 570 (*)    Neutro Abs 26.0 (*)    Abs Immature Granulocytes 0.75 (*)    All other components within normal limits  CULTURE, BLOOD (ROUTINE X 2)  CULTURE, BLOOD (ROUTINE X 2)  URINALYSIS, ROUTINE W REFLEX MICROSCOPIC  I-STAT CG4 LACTIC ACID, ED    EKG EKG Interpretation  Date/Time:  Tuesday February 16 2018 17:36:06 EST Ventricular Rate:  97 PR Interval:    QRS Duration: 112 QT Interval:  347 QTC Calculation: 441 R Axis:   63 Text Interpretation:  Sinus rhythm Incomplete left bundle branch block T waves peaked Reconfirmed by Davonna Belling [phone]) on 02/16/2018 7:34:23 PM   Radiology Dg Abdomen Acute W/chest  Result Date: 02/16/2018 CLINICAL DATA:  Nausea, vomiting, diarrhea and abdominal pain. EXAM: DG ABDOMEN ACUTE W/ 1V CHEST COMPARISON:  Portable chest dated 02/08/2018. Chest CTA and abdomen and pelvis CT dated 06/2018. Abdomen MR dated 02/12/2018. FINDINGS: Normal sized heart. Clear lungs with normal vascularity. Left breast surgical clips. Cervical spine fixation hardware. Normal bowel gas pattern without free peritoneal air. Stable percutaneous biliary drainage catheter and right hip prosthesis. Lumbar and thoracic spine degenerative changes. IMPRESSION: No acute abnormality. Electronically Signed   By: Claudie Revering M.D.   On: 02/16/2018 17:35    Procedures Procedures (including critical care time)  Medications Ordered in ED Medications  sodium zirconium cyclosilicate (LOKELMA) packet 10 g (has no administration in time range)  sodium chloride 0.9 % bolus 500 mL (has no administration in time range)  sodium chloride 0.9 % bolus 500 mL (0 mLs Intravenous Stopped 02/16/18 1907)     Initial Impression / Assessment and Plan / ED Course  I have reviewed the triage vital signs and the nursing notes.  Pertinent labs & imaging results that were available during my care of the patient were reviewed by me  and considered in my medical decision making (see chart for details).     Patient sent in for worsening renal function.  Creatinine up to 5.3 from normal 4 days ago.  BUN of 100.  Serum was 6.1.  I think likely over diuresed.  However does have a leukocytosis without fever.  Has had vomiting.  Has a biliary drain in.  Abdominal pain is not really feeling that different.  X-ray reassuring.  Culture sent.  Normal lactic acid.  Discussed with nephrology, Dr. Hollie Salk.  Will give Lokelma twice a day.  Will give hydration.  Will admit to hospitalist.  Was along okay and recheck labs in the morning.  Potentially could require transfer if not improving.  CRITICAL CARE Performed by: Davonna Belling Total critical care time: 30 minutes Critical care time was exclusive of separately billable procedures and treating other patients. Critical care was necessary to treat or prevent imminent or life-threatening deterioration. Critical care was time spent personally by me on the following activities: development of treatment plan with patient and/or surrogate as well as nursing, discussions with consultants, evaluation of patient's response to treatment, examination of patient, obtaining history from patient or surrogate, ordering and performing treatments and interventions, ordering  and review of laboratory studies, ordering and review of radiographic studies, pulse oximetry and re-evaluation of patient's condition.. Final Clinical Impressions(s) / ED Diagnoses   Final diagnoses:  AKI (acute kidney injury) ([place])  Hyperkalemia  Leukocytosis, unspecified type    ED Discharge Orders    None       Davonna Belling, MD 02/16/18 1949

## 2018-02-16 NOTE — ED Notes (Signed)
Hospitalist at bedside 

## 2018-02-16 NOTE — ED Notes (Signed)
Patient transported to CT 

## 2018-02-16 NOTE — ED Notes (Signed)
Patient transported to X-ray 

## 2018-02-16 NOTE — ED Triage Notes (Addendum)
Per EMS, patient from [facility], abnormal labs today. C/o nausea. A&O to baseline.  K 6.4 BUN 90.2  4mg  Zofran IM

## 2018-02-16 NOTE — ED Notes (Signed)
Unable to get additional access for lab work RN notified. Fluid bolus started.

## 2018-02-16 NOTE — ED Notes (Signed)
Family at bedside. 

## 2018-02-16 NOTE — Consult Note (Addendum)
[facility]  HISTORY AND PHYSICAL  Jordan Byrd is an 85 y.o. female.    Chief Complaint: abnormal labs   HPI: Pt is an 77F with a PMH sig for HTN, GERD, Afib on Xarelto, h/o breast cancer, COPD, and recent discharge from [facility] 02/12/2018 due to obstructive jaundice 2/2 likely cholangiocarcinoma s/p PTC drain placement who is now seen in consultation at the request of Dr. Alvino Chapel for evaluation and recommendations surrounding AKI and hyperkalemia.    Briefly, pt was admitted to [facility] 02/01/18- 02/12/2018 for obstructive jaundice thought to be secondary to cholangiocarcinoma.  Elevated Ca 19-9 but neg brush cytology so far.  She had a PTC drain placed 12/31 which required repositioning 02/09/2018.  She was discharged on 02/12/2018 with instructions to follow up with medical oncology for PET-CT and further diagnostic workup and treatment planning.    Since discharge, she has had poor PO intake.  She reports that RN staff at SNF has had to empty PTC drain bag 3-4 x daily (bag holds at least 500 mL).  She has had a poor appetite and isn't eating and drinking much.  Has had some nausea.  Doesn't remember last time she urinated.  She came to [facility] ED for evaluation today.  Labs as follows: Na 128, K 6.1, Cl 88 CO2 18 BUN 100 Cr 5.29 Tbili 2.5, AST 372 ALT 501.  CT renal stone protocol without any obvious nephrolithiasis or hydronephrosis.  Last creatinine was 0.87 on 02/10/2017.  She has been taking BP meds including amlodipine and bystolic.  No NSAIDs.  Review of prior admission notes an average PTC drain output of around 1.8-2.0L daily.  PMH: Past Medical History:  Diagnosis Date  . Anemia   . Arthritis    "all over" (02/01/2018)  . Asthma   . Atrial fibrillation ([place])    on Xarelto  . Breast cancer, left breast ([place]) 05/19/13   left breast stage IIA   . CHF (congestive heart failure) ([place])   . COPD (chronic obstructive pulmonary disease) ([place])   . GERD (gastroesophageal reflux disease)     [name] Prilosec  . Hypertension   . Macular degeneration   . Obstructive jaundice 02/01/2018  . On home oxygen therapy    "2.5L prn" (02/01/2018)  . Osteoarthritis    a. 03/28/11 - R Total Hip Arthroplasty  . Pleurisy   . Pneumonia    "2-3 times" (02/01/2018)   PSH: Past Surgical History:  Procedure Laterality Date  . ABDOMINAL HYSTERECTOMY  1962  . ADENOIDECTOMY  [id number]   "grew back 6 times"  . ANTERIOR CERVICAL DECOMP/DISCECTOMY FUSION    . APPENDECTOMY  1954  . BACK SURGERY    . BREAST LUMPECTOMY WITH NEEDLE LOCALIZATION Left 07/14/2013   Procedure: BREAST LUMPECTOMY WITH NEEDLE LOCALIZATION;  Surgeon: Stark Klein, MD;  Location: [place];  Service: General;  Laterality: Left;  . BREAST SURGERY    . CARDIAC CATHETERIZATION     1980's or 90's - reportedly normal  . CARDIOVERSION  04/05/2011   Procedure: CARDIOVERSION;  Surgeon: Coralyn Mark, MD;  Location: [place];  Service: Cardiovascular;  Laterality: N/A;  . CARDIOVERSION N/A 06/19/2012   Procedure: CARDIOVERSION-bedside([id number]);  Surgeon: Lelon Perla, MD;  Location: [facility] OR;  Service: Cardiovascular;  Laterality: N/A;  . CATARACT EXTRACTION BILATERAL W/ ANTERIOR VITRECTOMY Bilateral 2007/2008  . CERVICAL DISC SURGERY    . CHOLECYSTECTOMY OPEN    . COLONOSCOPY     "I don't know but I ain't gonna  have no more"  . DILATION AND CURETTAGE OF UTERUS    . EXCISION / BIOPSY BREAST / NIPPLE / DUCT     left breast  . INCISION AND DRAINAGE Right 1947   "stuck nail in my foot"  . IR ENDOLUMINAL BX OF BILIARY TREE  02/09/2018  . IR EXCHANGE BILIARY DRAIN  02/09/2018  . IR INT EXT BILIARY DRAIN WITH CHOLANGIOGRAM  02/02/2018  . JOINT REPLACEMENT    . KNEE ARTHROSCOPY Bilateral   . LUMBAR DISC SURGERY  10/30/2008   L2-S1  . PLANTAR'S WART EXCISION Bilateral   . SHOULDER OPEN ROTATOR CUFF REPAIR Left   . TONSILLECTOMY AND ADENOIDECTOMY  1943  . TOTAL HIP ARTHROPLASTY  03/28/2011   Procedure: TOTAL HIP ARTHROPLASTY;  Surgeon: Yvette Rack., MD;  Location: [facility];  Service: Orthopedics;  Laterality: Right;    Past Medical History:  Diagnosis Date  . Anemia   . Arthritis    "all over" (02/01/2018)  . Asthma   . Atrial fibrillation ([place])    on Xarelto  . Breast cancer, left breast ([place]) 05/19/13   left breast stage IIA   . CHF (congestive heart failure) ([place])   . COPD (chronic obstructive pulmonary disease) ([place])   . GERD (gastroesophageal reflux disease)    [name] Prilosec  . Hypertension   . Macular degeneration   . Obstructive jaundice 02/01/2018  . On home oxygen therapy    "2.5L prn" (02/01/2018)  . Osteoarthritis    a. 03/28/11 - R Total Hip Arthroplasty  . Pleurisy   . Pneumonia    "2-3 times" (02/01/2018)    Medications:  I have reviewed the patient's current medications in Epic.  ALLERGIES:   Allergies  Allergen Reactions  . Methocarbamol Hives  . Vicodin [Hydrocodone-Acetaminophen] Other (See Comments)    Interacts with bp medication and drops blood pressure  . Aspirin Nausea And Vomiting  . Butazolidin [Phenylbutazone] Other (See Comments)    Unknown reaction  . Celebrex [Celecoxib] Nausea And Vomiting  . Codeine Nausea And Vomiting  . Doripenem Rash  . Aspirin Buf(Alhyd-Mghyd-Cacar) Nausea And Vomiting  . Mucinex [Guaifenesin Er] Itching  . Temazepam Other (See Comments)    Unknown reaction    FAM HX: Family History  Problem Relation Age of Onset  . Diabetes Father        Father, Mother, 4 sisters (2 living)  . Heart attack Father        (Deceased)  . Heart failure Father        (deceased 66)  . Hypertension Father   . Stroke Mother        (deceased 20)  . Hypertension Mother   . Cancer Sister 3       breast cancer  . Breast cancer Sister 73  . Heart attack Sister   . Diabetes Sister   . Cancer Other 13       niece with breast cancer    Social History:   reports that she quit smoking about 47 years ago. Her smoking use included cigarettes. She has a 21.00  pack-year smoking history. She has never used smokeless tobacco. She reports previous alcohol use. She reports that she does not use drugs.  ROS: ROS: all other systems reviewed and are negative except as per HPI  Blood pressure 135/77, pulse 88, temperature (!) 97.4 F (36.3 C), temperature source Oral, resp. rate (!) 25, SpO2 100 %. PHYSICAL EXAM: Physical Exam  GEN older woman, NAD HEENT  EOMI PERRL mild scleral icterus NECK no JVD PULM on O2, normal WOB, faint expiratory wheezes CV irregular no m/r/g ABD obese, soft, nontedner, PTC drain in RUQ with bilious effluent EXT trace pitting edema shins NEURO AAO x 3 no asterixis SKIN no rashes, some brusiing at prior IV sites   Results for orders placed or performed during the hospital encounter of 02/16/18 (from the past 48 hour(s))  Comprehensive metabolic panel     Status: Abnormal   Collection Time: 02/16/18  6:20 PM  Result Value Ref Range   Sodium 128 (L) 135 - 145 mmol/L   Potassium 6.1 (H) 3.5 - 5.1 mmol/L    Comment: NO VISIBLE HEMOLYSIS   Chloride 88 (L) 98 - 111 mmol/L   CO2 18 (L) 22 - 32 mmol/L   Glucose, Bld 122 (H) 70 - 99 mg/dL   BUN 100 (H) 8 - 23 mg/dL   Creatinine, Ser 5.29 (H) 0.44 - 1.00 mg/dL   Calcium 8.9 8.9 - 10.3 mg/dL   Total Protein 8.4 (H) 6.5 - 8.1 g/dL   Albumin 3.6 3.5 - 5.0 g/dL   AST 372 (H) 15 - 41 U/L   ALT 501 (H) 0 - 44 U/L   Alkaline Phosphatase 325 (H) 38 - 126 U/L   Total Bilirubin 2.5 (H) 0.3 - 1.2 mg/dL   GFR calc non Af Amer 7 (L) >60 mL/min   GFR calc Af Amer 8 (L) >60 mL/min   Anion gap 22 (H) 5 - 15    Comment: REPEATED TO VERIFY Performed at [facility], [place] [address] [place], [place] [address]   Protime-INR     Status: Abnormal   Collection Time: 02/16/18  6:20 PM  Result Value Ref Range   Prothrombin Time 17.9 (H) 11.4 - 15.2 seconds   INR 1.49     Comment: Performed at [facility], [place] [address], [place], [place] [address]  CBC  with Differential     Status: Abnormal   Collection Time: 02/16/18  6:20 PM  Result Value Ref Range   WBC 28.3 (H) 4.0 - 10.5 K/uL   RBC 4.85 3.87 - 5.11 MIL/uL   Hemoglobin 13.9 12.0 - 15.0 g/dL   HCT 44.3 36.0 - 46.0 %   MCV 91.3 80.0 - 100.0 fL   MCH 28.7 26.0 - 34.0 pg   MCHC 31.4 30.0 - 36.0 g/dL   RDW 17.5 (H) 11.5 - 15.5 %   Platelets 570 (H) 150 - 400 K/uL   nRBC 0.0 0.0 - 0.2 %   Neutrophils Relative % 92 %   Neutro Abs 26.0 (H) 1.7 - 7.7 K/uL   Lymphocytes Relative 2 %   Lymphs Abs 0.7 0.7 - 4.0 K/uL   Monocytes Relative 3 %   Monocytes Absolute 0.8 0.1 - 1.0 K/uL   Eosinophils Relative 0 %   Eosinophils Absolute 0.0 0.0 - 0.5 K/uL   Basophils Relative 0 %   Basophils Absolute 0.1 0.0 - 0.1 K/uL   WBC Morphology WHITE COUNT CONFIRMED ON SMEAR    Immature Granulocytes 3 %   Abs Immature Granulocytes 0.75 (H) 0.00 - 0.07 K/uL    Comment: Performed at [facility], [place] [address] [place], [place] [address]  I-Stat CG4 Lactic Acid, ED     Status: None   Collection Time: 02/16/18  6:23 PM  Result Value Ref Range   Lactic Acid, Venous 1.70 0.5 - 1.9 mmol/L  I-Stat CG4 Lactic  Acid, ED     Status: None   Collection Time: 02/16/18  8:27 PM  Result Value Ref Range   Lactic Acid, Venous 1.21 0.5 - 1.9 mmol/L    Dg Abdomen Acute W/chest  Result Date: 02/16/2018 CLINICAL DATA:  Nausea, vomiting, diarrhea and abdominal pain. EXAM: DG ABDOMEN ACUTE W/ 1V CHEST COMPARISON:  Portable chest dated 02/08/2018. Chest CTA and abdomen and pelvis CT dated 06/2018. Abdomen MR dated 02/12/2018. FINDINGS: Normal sized heart. Clear lungs with normal vascularity. Left breast surgical clips. Cervical spine fixation hardware. Normal bowel gas pattern without free peritoneal air. Stable percutaneous biliary drainage catheter and right hip prosthesis. Lumbar and thoracic spine degenerative changes. IMPRESSION: No acute abnormality. Electronically Signed   By: Claudie Revering M.D.    On: 02/16/2018 17:35   Ct Renal Stone Study  Result Date: 02/16/2018 CLINICAL DATA:  Flank pain. Renal insufficiency. Nausea and vomiting. EXAM: CT ABDOMEN AND PELVIS WITHOUT CONTRAST TECHNIQUE: Multidetector CT imaging of the abdomen and pelvis was performed following the standard protocol without IV contrast. COMPARISON:  Chest, abdomen and pelvis radiographs obtained earlier today. Chest CTA and abdomen and pelvis CT dated 02/07/2018. Abdomen and pelvis CT dated 02/01/2018. FINDINGS: Lower chest: Multiple small nodules at both lung bases. The largest is in the right lower lobe, measuring 5 mm on image number 26 series 4. This was not present on the previous examinations and multiple other nodules are also new on both sides. Hepatobiliary: Previously demonstrated percutaneous biliary drainage catheter, poorly defined left lobe liver mass and markedly dilated left lobe bile ducts. Cholecystectomy clips. Pancreas: Unremarkable. No pancreatic ductal dilatation or surrounding inflammatory changes. Spleen: Normal in size without focal abnormality. Adrenals/Urinary Tract: Normal appearing adrenal glands. Small lower pole right renal cyst. Normal appearing left kidney, ureters and urinary bladder. Portions of the urinary bladder and distal ureters are obscured by streak artifacts from a right hip prosthesis. Stomach/Bowel: Surgically absent appendix. Unremarkable stomach, small bowel and colon. Vascular/Lymphatic: Atheromatous arterial calcifications without aneurysm. No enlarged lymph nodes. Reproductive: Status post hysterectomy. No adnexal masses. Other: No abdominal wall hernia or abnormality. No abdominopelvic ascites. Musculoskeletal: Right hip prosthesis with associated streak artifacts. Lumbar and lower thoracic spine degenerative changes. IMPRESSION: 1. No acute abnormality. 2. Multiple interval small nodules at both lung bases suspicious for metastases. 3. Previously demonstrated left lobe liver mass  causing left lobe liver biliary obstruction. Electronically Signed   By: Claudie Revering M.D.   On: 02/16/2018 21:07    Assessment/Plan  1.  AKI: last known baseline 0.87 on 02/10/2018.  AKI likely hemodynamically mediated with poor PO intake, continued use of BP meds, and copious amounts of PTC output.  Will give IVFs (will choose isotonic bicarb d/t mild acidosis), medical treatment of hyperK, PVR and insert Foley for accurate output.  No indications for dialysis at present- discussed possible need for it with pt and grandson.  Will keep at [facility] for now.  Rec holding BP meds for now.  2.  Hyperkalemia: s/p medical treatment, ordering Lokelma as well.    3.  Likely cholangiocarcinoma- has high-output PTC drain- unclear if we can slow it down or find another solution to the PTC drain--> defer to primary, onc, GI. LFTs slightly higher than on d/c- per primary.  4.  Mild metabolic acidosis: due to AKI, expect to resolve with IVFs.  5.  Hyponatremia: likely hypovolemic hyponatremia but will get urine/ serum osms and urine sodium  Mckynzi Cammon 02/16/2018, 10:07 PM

## 2018-02-17 ENCOUNTER — Inpatient Hospital Stay (HOSPITAL_COMMUNITY): Payer: PPO

## 2018-02-17 ENCOUNTER — Encounter (HOSPITAL_COMMUNITY): Payer: Self-pay | Admitting: Radiology

## 2018-02-17 DIAGNOSIS — E875 Hyperkalemia: Secondary | ICD-10-CM

## 2018-02-17 DIAGNOSIS — N179 Acute kidney failure, unspecified: Secondary | ICD-10-CM

## 2018-02-17 DIAGNOSIS — I480 Paroxysmal atrial fibrillation: Secondary | ICD-10-CM

## 2018-02-17 LAB — BASIC METABOLIC PANEL
Anion gap: 26 — ABNORMAL HIGH (ref 5–15)
BUN: 105 mg/dL — ABNORMAL HIGH (ref 8–23)
CO2: 18 mmol/L — ABNORMAL LOW (ref 22–32)
Calcium: 8.4 mg/dL — ABNORMAL LOW (ref 8.9–10.3)
Chloride: 86 mmol/L — ABNORMAL LOW (ref 98–111)
Creatinine, Ser: 5.67 mg/dL — ABNORMAL HIGH (ref 0.44–1.00)
GFR calc Af Amer: 7 mL/min — ABNORMAL LOW (ref >60)
GFR calc non Af Amer: 6 mL/min — ABNORMAL LOW (ref >60)
GLUCOSE: 110 mg/dL — AB (ref 70–99)
Potassium: 5.2 mmol/L — ABNORMAL HIGH (ref 3.5–5.1)
Sodium: 130 mmol/L — ABNORMAL LOW (ref 135–145)

## 2018-02-17 LAB — URINALYSIS, ROUTINE W REFLEX MICROSCOPIC
Bilirubin Urine: NEGATIVE
Glucose, UA: NEGATIVE mg/dL
Ketones, ur: NEGATIVE mg/dL
NITRITE: NEGATIVE
PH: 5 (ref 5.0–8.0)
Protein, ur: 30 mg/dL — AB
Specific Gravity, Urine: 1.017 (ref 1.005–1.030)

## 2018-02-17 LAB — HEPATIC FUNCTION PANEL
ALT: 505 U/L — ABNORMAL HIGH (ref 0–44)
AST: 290 U/L — ABNORMAL HIGH (ref 15–41)
Albumin: 3.6 g/dL (ref 3.5–5.0)
Alkaline Phosphatase: 281 U/L — ABNORMAL HIGH (ref 38–126)
Bilirubin, Direct: 0.9 mg/dL — ABNORMAL HIGH (ref 0.0–0.2)
Indirect Bilirubin: 1.7 mg/dL — ABNORMAL HIGH (ref 0.3–0.9)
Total Bilirubin: 2.6 mg/dL — ABNORMAL HIGH (ref 0.3–1.2)
Total Protein: 7.6 g/dL (ref 6.5–8.1)

## 2018-02-17 LAB — CBC
HEMATOCRIT: 41.2 % (ref 36.0–46.0)
Hemoglobin: 13 g/dL (ref 12.0–15.0)
MCH: 28.7 pg (ref 26.0–34.0)
MCHC: 31.6 g/dL (ref 30.0–36.0)
MCV: 90.9 fL (ref 80.0–100.0)
Platelets: 478 10*3/uL — ABNORMAL HIGH (ref 150–400)
RBC: 4.53 MIL/uL (ref 3.87–5.11)
RDW: 17.2 % — ABNORMAL HIGH (ref 11.5–15.5)
WBC: 23.3 10*3/uL — ABNORMAL HIGH (ref 4.0–10.5)
nRBC: 0 % (ref 0.0–0.2)

## 2018-02-17 LAB — SODIUM, URINE, RANDOM: Sodium, Ur: 11 mmol/L

## 2018-02-17 LAB — MRSA PCR SCREENING: MRSA by PCR: NEGATIVE

## 2018-02-17 LAB — OSMOLALITY: Osmolality: 304 mOsm/kg — ABNORMAL HIGH (ref 275–295)

## 2018-02-17 LAB — OSMOLALITY, URINE: Osmolality, Ur: 353 mOsm/kg (ref 300–900)

## 2018-02-17 MED ORDER — LIDOCAINE HCL 1 % IJ SOLN
INTRAMUSCULAR | Status: AC
  Filled 2018-02-17: qty 20

## 2018-02-17 MED ORDER — ENOXAPARIN SODIUM 100 MG/ML ~~LOC~~ SOLN
1.0000 mg/kg | Freq: Every day | SUBCUTANEOUS | Status: DC
  Administered 2018-02-18: 100 mg via SUBCUTANEOUS
  Filled 2018-02-17: qty 1

## 2018-02-17 MED ORDER — NEBIVOLOL HCL 10 MG PO TABS
20.0000 mg | ORAL_TABLET | Freq: Every day | ORAL | Status: DC
  Administered 2018-02-17: 20 mg via ORAL
  Filled 2018-02-17: qty 2

## 2018-02-17 MED ORDER — MOMETASONE FURO-FORMOTEROL FUM 200-5 MCG/ACT IN AERO
2.0000 | INHALATION_SPRAY | Freq: Two times a day (BID) | RESPIRATORY_TRACT | Status: DC
  Administered 2018-02-17 – 2018-02-23 (×11): 2 via RESPIRATORY_TRACT
  Filled 2018-02-17: qty 8.8

## 2018-02-17 MED ORDER — ONDANSETRON HCL 4 MG/2ML IJ SOLN
4.0000 mg | Freq: Four times a day (QID) | INTRAMUSCULAR | Status: DC | PRN
  Administered 2018-02-18 (×2): 4 mg via INTRAVENOUS
  Filled 2018-02-17 (×2): qty 2

## 2018-02-17 MED ORDER — PIPERACILLIN-TAZOBACTAM 3.375 G IVPB
INTRAVENOUS | Status: AC
  Administered 2018-02-17: 16:00:00
  Filled 2018-02-17: qty 50

## 2018-02-17 MED ORDER — ONDANSETRON HCL 4 MG PO TABS: 4.0000 mg | ORAL_TABLET | Freq: Four times a day (QID) | ORAL | Status: DC | PRN

## 2018-02-17 MED ORDER — ACETAMINOPHEN 325 MG PO TABS: 650.0000 mg | ORAL_TABLET | Freq: Four times a day (QID) | ORAL | Status: DC | PRN

## 2018-02-17 MED ORDER — MIDAZOLAM HCL 2 MG/2ML IJ SOLN
INTRAMUSCULAR | Status: AC
  Filled 2018-02-17: qty 4

## 2018-02-17 MED ORDER — FENTANYL CITRATE (PF) 100 MCG/2ML IJ SOLN
INTRAMUSCULAR | Status: AC | PRN
  Administered 2018-02-17: 25 ug via INTRAVENOUS
  Administered 2018-02-17: 50 ug via INTRAVENOUS

## 2018-02-17 MED ORDER — FENTANYL CITRATE (PF) 100 MCG/2ML IJ SOLN
INTRAMUSCULAR | Status: AC
  Filled 2018-02-17: qty 2

## 2018-02-17 MED ORDER — SENNOSIDES-DOCUSATE SODIUM 8.6-50 MG PO TABS
1.0000 | ORAL_TABLET | Freq: Two times a day (BID) | ORAL | Status: DC
  Administered 2018-02-17: 1 via ORAL
  Filled 2018-02-17 (×2): qty 1

## 2018-02-17 MED ORDER — ALBUTEROL SULFATE (2.5 MG/3ML) 0.083% IN NEBU
2.5000 mg | INHALATION_SOLUTION | Freq: Four times a day (QID) | RESPIRATORY_TRACT | Status: DC | PRN
  Administered 2018-02-20 (×2): 2.5 mg via RESPIRATORY_TRACT
  Filled 2018-02-17 (×2): qty 3

## 2018-02-17 MED ORDER — NITROGLYCERIN 0.4 MG SL SUBL: 0.4000 mg | SUBLINGUAL_TABLET | SUBLINGUAL | Status: DC | PRN

## 2018-02-17 MED ORDER — ALBUTEROL SULFATE HFA 108 (90 BASE) MCG/ACT IN AERS: 1.0000 | INHALATION_SPRAY | Freq: Four times a day (QID) | RESPIRATORY_TRACT | Status: DC | PRN

## 2018-02-17 MED ORDER — SODIUM CHLORIDE 0.9 % IV BOLUS: 1000.0000 mL | Freq: Once | INTRAVENOUS | Status: DC

## 2018-02-17 MED ORDER — GELATIN ABSORBABLE 12-7 MM EX MISC
CUTANEOUS | Status: AC
  Filled 2018-02-17: qty 1

## 2018-02-17 MED ORDER — ACETAMINOPHEN 650 MG RE SUPP: 650.0000 mg | Freq: Four times a day (QID) | RECTAL | Status: DC | PRN

## 2018-02-17 MED ORDER — LIDOCAINE HCL (PF) 1 % IJ SOLN
INTRAMUSCULAR | Status: AC | PRN
  Administered 2018-02-17: 10 mL

## 2018-02-17 MED ORDER — TRAMADOL HCL 50 MG PO TABS
50.0000 mg | ORAL_TABLET | Freq: Two times a day (BID) | ORAL | Status: DC
  Administered 2018-02-17: 50 mg via ORAL
  Filled 2018-02-17 (×4): qty 1

## 2018-02-17 MED ORDER — MIDAZOLAM HCL 2 MG/2ML IJ SOLN
INTRAMUSCULAR | Status: AC | PRN
  Administered 2018-02-17: 1 mg via INTRAVENOUS
  Administered 2018-02-17: 0.5 mg via INTRAVENOUS

## 2018-02-17 MED ORDER — AMLODIPINE BESYLATE 10 MG PO TABS
10.0000 mg | ORAL_TABLET | Freq: Every day | ORAL | Status: DC
  Administered 2018-02-17: 10 mg via ORAL
  Filled 2018-02-17: qty 1

## 2018-02-17 MED ORDER — NEBIVOLOL HCL 10 MG PO TABS
10.0000 mg | ORAL_TABLET | Freq: Every day | ORAL | Status: DC
  Administered 2018-02-18 – 2018-02-23 (×6): 10 mg via ORAL
  Filled 2018-02-17 (×6): qty 1

## 2018-02-17 MED ORDER — PANTOPRAZOLE SODIUM 40 MG PO TBEC
40.0000 mg | DELAYED_RELEASE_TABLET | Freq: Every day | ORAL | Status: DC
  Administered 2018-02-17 – 2018-02-23 (×7): 40 mg via ORAL
  Filled 2018-02-17 (×7): qty 1

## 2018-02-17 MED ORDER — POLYETHYLENE GLYCOL 3350 17 G PO PACK
17.0000 g | PACK | Freq: Two times a day (BID) | ORAL | Status: DC
  Administered 2018-02-17: 17 g via ORAL
  Filled 2018-02-17 (×2): qty 1

## 2018-02-17 NOTE — Progress Notes (Signed)
Subjective:  On the way to IR for another drain placement given cont biliary dilitation and also for possible biopsy of this hepatic mass.  She is thirsty but otherwise OK-  As of labs this AM potassium and sodium improved - some UOP around 200 dark in foley   Objective Vital signs in last 24 hours: Vitals:   02/16/18 2252 02/17/18 0448 02/17/18 0959 02/17/18 1233  BP:    126/62  Pulse:    78  Resp:    18  Temp:    97.8 F (36.6 C)  TempSrc:    Oral  SpO2:   98% 99%  Weight: 101 kg 101.9 kg    Height: 5\' 6"  (1.676 m)      Weight change:   Intake/Output Summary (Last 24 hours) at 02/17/2018 1429 Last data filed at 02/17/2018 0935 Gross per 24 hour  Intake 900.07 ml  Output 845 ml  Net 55.07 ml    Assessment/ Plan: Pt is a 85 y.o. yo female who was admitted on 02/16/2018 with AKI in the setting of volume depletion due to high output chole drain   Assessment/Plan: 1. Renal - AKI- crt 0.87 on 1/8- now over 5.  U/A with moderate blood- RBC and WBC- really no protein- high spec gravity.  Seeming like ATN from volume depletion.  Continue with IV fluid repletion- will give another bolus of NS and increase rate of bicarb drip and follow.  No indications for HD and hopefully will not need  2. Hyponatremia- seeming like hypovolemic hyponatremia- giving NS   3. Anemia-not an issue- likely hemoconcentrated  4. HTN/volume- still seems volume depleted- cont IVF and increase rates.  Will stop amlodipine and decrease bystolic as I do not want BP to get too low.  Might need strategy if there is one to decrease the drainage from the chole tube ?   5. Hyperkalemia- continue lokelma and follow   Jordan Byrd    Labs: Basic Metabolic Panel: Recent Labs  Lab 02/16/18 1820 02/17/18 0531  NA 128* 130*  K 6.1* 5.2*  CL 88* 86*  CO2 18* 18*  GLUCOSE 122* 110*  BUN 100* 105*  CREATININE 5.29* 5.67*  CALCIUM 8.9 8.4*   Liver Function Tests: Recent Labs  Lab 02/12/18 0628  02/16/18 1820 02/17/18 0531  AST 59* 372* 290*  ALT 151* 501* 505*  ALKPHOS 340* 325* 281*  BILITOT 2.0* 2.5* 2.6*  PROT 6.7 8.4* 7.6  ALBUMIN 2.6* 3.6 3.6   No results for input(s): LIPASE, AMYLASE in the last 168 hours. No results for input(s): AMMONIA in the last 168 hours. CBC: Recent Labs  Lab 02/16/18 1820 02/17/18 0531  WBC 28.3* 23.3*  NEUTROABS 26.0*  --   HGB 13.9 13.0  HCT 44.3 41.2  MCV 91.3 90.9  PLT 570* 478*   Cardiac Enzymes: No results for input(s): CKTOTAL, CKMB, CKMBINDEX, TROPONINI in the last 168 hours. CBG: No results for input(s): GLUCAP in the last 168 hours.  Iron Studies: No results for input(s): IRON, TIBC, TRANSFERRIN, FERRITIN in the last 72 hours. Studies/Results: Dg Abdomen Acute W/chest  Result Date: 02/16/2018 CLINICAL DATA:  Nausea, vomiting, diarrhea and abdominal pain. EXAM: DG ABDOMEN ACUTE W/ 1V CHEST COMPARISON:  Portable chest dated 02/08/2018. Chest CTA and abdomen and pelvis CT dated 06/2018. Abdomen MR dated 02/12/2018. FINDINGS: Normal sized heart. Clear lungs with normal vascularity. Left breast surgical clips. Cervical spine fixation hardware. Normal bowel gas pattern without free peritoneal air. Stable percutaneous biliary  drainage catheter and right hip prosthesis. Lumbar and thoracic spine degenerative changes. IMPRESSION: No acute abnormality. Electronically Signed   By: Claudie Revering M.D.   On: 02/16/2018 17:35   Ct Renal Stone Study  Result Date: 02/16/2018 CLINICAL DATA:  Flank pain. Renal insufficiency. Nausea and vomiting. EXAM: CT ABDOMEN AND PELVIS WITHOUT CONTRAST TECHNIQUE: Multidetector CT imaging of the abdomen and pelvis was performed following the standard protocol without IV contrast. COMPARISON:  Chest, abdomen and pelvis radiographs obtained earlier today. Chest CTA and abdomen and pelvis CT dated 02/07/2018. Abdomen and pelvis CT dated 02/01/2018. FINDINGS: Lower chest: Multiple small nodules at both lung bases.  The largest is in the right lower lobe, measuring 5 mm on image number 26 series 4. This was not present on the previous examinations and multiple other nodules are also new on both sides. Hepatobiliary: Previously demonstrated percutaneous biliary drainage catheter, poorly defined left lobe liver mass and markedly dilated left lobe bile ducts. Cholecystectomy clips. Pancreas: Unremarkable. No pancreatic ductal dilatation or surrounding inflammatory changes. Spleen: Normal in size without focal abnormality. Adrenals/Urinary Tract: Normal appearing adrenal glands. Small lower pole right renal cyst. Normal appearing left kidney, ureters and urinary bladder. Portions of the urinary bladder and distal ureters are obscured by streak artifacts from a right hip prosthesis. Stomach/Bowel: Surgically absent appendix. Unremarkable stomach, small bowel and colon. Vascular/Lymphatic: Atheromatous arterial calcifications without aneurysm. No enlarged lymph nodes. Reproductive: Status post hysterectomy. No adnexal masses. Other: No abdominal wall hernia or abnormality. No abdominopelvic ascites. Musculoskeletal: Right hip prosthesis with associated streak artifacts. Lumbar and lower thoracic spine degenerative changes. IMPRESSION: 1. No acute abnormality. 2. Multiple interval small nodules at both lung bases suspicious for metastases. 3. Previously demonstrated left lobe liver mass causing left lobe liver biliary obstruction. Electronically Signed   By: Claudie Revering M.D.   On: 02/16/2018 21:07   Medications: Infusions: .  sodium bicarbonate  infusion 1000 mL 75 mL/hr at 02/17/18 1423    Scheduled Medications: . amLODipine  10 mg Oral Daily  . enoxaparin (LOVENOX) injection  1 mg/kg Subcutaneous Daily  . mouth rinse  15 mL Mouth Rinse BID  . mometasone-formoterol  2 puff Inhalation BID  . nebivolol  20 mg Oral Daily  . pantoprazole  40 mg Oral Daily  . polyethylene glycol  17 g Oral BID  . senna-docusate  1 tablet  Oral BID  . sodium zirconium cyclosilicate  10 g Oral BID  . traMADol  50 mg Oral BID    have reviewed scheduled and prn medications.  Physical Exam: General: NAD Heart: RRR Lungs: mostly clear Abdomen: distended- drain in place Extremities: no peripheral edema    02/17/2018,2:29 PM  LOS: 1 day

## 2018-02-17 NOTE — Progress Notes (Signed)
Chief Complaint: Patient was seen in consultation today for obstructive jaundice  Supervising Physician: Markus Daft  Patient Status: [facility] - In-pt  History of Present Illness: Jordan Byrd is a 85 y.o. female well known to IR service for obstructive jaundice. Had PTC with biliary drain placement on 12/31 with presumed ill defined hepatic mass. This was followed by Exchange of biliary drain and endoluminal ductal brush biopsy on 1/7. Unfortunately the pathology was non-diagnostic but overall she was improving. Her bilirubin and LFTs trended down and she was discharged. She has been readmitted with poor appetite and renal issues. A new CT was ordered which again shows the ill defined hepatic lesion felt to be the source of the obstruction. As well, there continues to be significant dilatation of the left hepatic ductal system. We are still without working tissue diagnosis.   Past Medical History:  Diagnosis Date  . Anemia   . Arthritis    "all over" (02/01/2018)  . Asthma   . Atrial fibrillation ([place])    on Xarelto  . Breast cancer, left breast ([place]) 05/19/13   left breast stage IIA   . CHF (congestive heart failure) ([place])   . COPD (chronic obstructive pulmonary disease) ([place])   . GERD (gastroesophageal reflux disease)    [name] Prilosec  . Hypertension   . Macular degeneration   . Obstructive jaundice 02/01/2018  . On home oxygen therapy    "2.5L prn" (02/01/2018)  . Osteoarthritis    a. 03/28/11 - R Total Hip Arthroplasty  . Pleurisy   . Pneumonia    "2-3 times" (02/01/2018)    Past Surgical History:  Procedure Laterality Date  . ABDOMINAL HYSTERECTOMY  1962  . ADENOIDECTOMY  [id number]   "grew back 6 times"  . ANTERIOR CERVICAL DECOMP/DISCECTOMY FUSION    . APPENDECTOMY  1954  . BACK SURGERY    . BREAST LUMPECTOMY WITH NEEDLE LOCALIZATION Left 07/14/2013   Procedure: BREAST LUMPECTOMY WITH NEEDLE LOCALIZATION;  Surgeon: Stark Klein, MD;  Location: [place];   Service: General;  Laterality: Left;  . BREAST SURGERY    . CARDIAC CATHETERIZATION     1980's or 90's - reportedly normal  . CARDIOVERSION  04/05/2011   Procedure: CARDIOVERSION;  Surgeon: Coralyn Mark, MD;  Location: [place];  Service: Cardiovascular;  Laterality: N/A;  . CARDIOVERSION N/A 06/19/2012   Procedure: CARDIOVERSION-bedside([id number]);  Surgeon: Lelon Perla, MD;  Location: [facility] OR;  Service: Cardiovascular;  Laterality: N/A;  . CATARACT EXTRACTION BILATERAL W/ ANTERIOR VITRECTOMY Bilateral 2007/2008  . CERVICAL DISC SURGERY    . CHOLECYSTECTOMY OPEN    . COLONOSCOPY     "I don't know but I ain't gonna have no more"  . DILATION AND CURETTAGE OF UTERUS    . EXCISION / BIOPSY BREAST / NIPPLE / DUCT     left breast  . INCISION AND DRAINAGE Right 1947   "stuck nail in my foot"  . IR ENDOLUMINAL BX OF BILIARY TREE  02/09/2018  . IR EXCHANGE BILIARY DRAIN  02/09/2018  . IR INT EXT BILIARY DRAIN WITH CHOLANGIOGRAM  02/02/2018  . JOINT REPLACEMENT    . KNEE ARTHROSCOPY Bilateral   . LUMBAR DISC SURGERY  10/30/2008   L2-S1  . PLANTAR'S WART EXCISION Bilateral   . SHOULDER OPEN ROTATOR CUFF REPAIR Left   . TONSILLECTOMY AND ADENOIDECTOMY  1943  . TOTAL HIP ARTHROPLASTY  03/28/2011   Procedure: TOTAL HIP ARTHROPLASTY;  Surgeon: Yvette Rack., MD;  Location: [facility]  OR;  Service: Orthopedics;  Laterality: Right;    Allergies: Methocarbamol; Vicodin [hydrocodone-acetaminophen]; Aspirin; Butazolidin [phenylbutazone]; Celebrex [celecoxib]; Codeine; Doripenem; Aspirin buf(alhyd-mghyd-cacar); Mucinex [guaifenesin er]; and Temazepam  Medications:  Current Facility-Administered Medications:  .  acetaminophen (TYLENOL) tablet 650 mg, 650 mg, Oral, Q6H PRN **OR** acetaminophen (TYLENOL) suppository 650 mg, 650 mg, Rectal, Q6H PRN, Rise Patience, MD .  albuterol (PROVENTIL) (2.5 MG/3ML) 0.083% nebulizer solution 2.5 mg, 2.5 mg, Nebulization, Q6H PRN, Rise Patience, MD .  amLODipine  (NORVASC) tablet 10 mg, 10 mg, Oral, Daily, Rise Patience, MD, 10 mg at 02/17/18 0921 .  enoxaparin (LOVENOX) injection 100 mg, 1 mg/kg, Subcutaneous, Daily, Samtani, Jai-Gurmukh, MD .  MEDLINE mouth rinse, 15 mL, Mouth Rinse, BID, Rise Patience, MD .  mometasone-formoterol [facility]) 200-5 MCG/ACT inhaler 2 puff, 2 puff, Inhalation, BID, Rise Patience, MD, 2 puff at 02/17/18 0959 .  nebivolol (BYSTOLIC) tablet 20 mg, 20 mg, Oral, Daily, Rise Patience, MD, 20 mg at 02/17/18 0920 .  nitroGLYCERIN (NITROSTAT) SL tablet 0.4 mg, 0.4 mg, Sublingual, Q5 min PRN, Rise Patience, MD .  ondansetron [facility]) tablet 4 mg, 4 mg, Oral, Q6H PRN **OR** ondansetron (ZOFRAN) injection 4 mg, 4 mg, Intravenous, Q6H PRN, Rise Patience, MD .  pantoprazole (PROTONIX) EC tablet 40 mg, 40 mg, Oral, Daily, Rise Patience, MD, 40 mg at 02/17/18 0921 .  polyethylene glycol (MIRALAX / GLYCOLAX) packet 17 g, 17 g, Oral, BID, Rise Patience, MD, 17 g at 02/17/18 0920 .  senna-docusate (Senokot-S) tablet 1 tablet, 1 tablet, Oral, BID, Rise Patience, MD, 1 tablet at 02/17/18 [phone]  sodium bicarbonate 150 mEq in dextrose 5 % 1,000 mL infusion, , Intravenous, Continuous, Rise Patience, MD, Last Rate: 75 mL/hr at 02/17/18 0600 .  sodium zirconium cyclosilicate (LOKELMA) packet 10 g, 10 g, Oral, BID, Rise Patience, MD, 10 g at 02/16/18 2124 .  traMADol (ULTRAM) tablet 50 mg, 50 mg, Oral, BID, Rise Patience, MD, 50 mg at 02/17/18 6269    Family History  Problem Relation Age of Onset  . Diabetes Father        Father, Mother, 4 sisters (2 living)  . Heart attack Father        (Deceased)  . Heart failure Father        (deceased 39)  . Hypertension Father   . Stroke Mother        (deceased 39)  . Hypertension Mother   . Cancer Sister 6       breast cancer  . Breast cancer Sister 66  . Heart attack Sister   . Diabetes Sister   . Cancer Other 48         niece with breast cancer    Social History   Socioeconomic History  . Marital status: Widowed    Spouse name: Not on file  . Number of children: Not on file  . Years of education: Not on file  . Highest education level: Not on file  Occupational History  . Occupation: Retired  Scientific laboratory technician  . Financial resource strain: Not on file  . Food insecurity:    Worry: Not on file    Inability: Not on file  . Transportation needs:    Medical: Not on file    Non-medical: Not on file  Tobacco Use  . Smoking status: Former Smoker    Packs/day: 1.00    Years: 21.00    Pack years:  21.00    Types: Cigarettes    Last attempt to quit: 08/15/1970    Years since quitting: 47.5  . Smokeless tobacco: Never Used  Substance and Sexual Activity  . Alcohol use: Not Currently    Alcohol/week: 0.0 standard drinks  . Drug use: Never  . Sexual activity: Not Currently  Lifestyle  . Physical activity:    Days per week: Not on file    Minutes per session: Not on file  . Stress: Not on file  Relationships  . Social connections:    Talks on phone: Not on file    Gets together: Not on file    Attends religious service: Not on file    Active member of club or organization: Not on file    Attends meetings of clubs or organizations: Not on file    Relationship status: Not on file  Other Topics Concern  . Not on file  Social History Narrative   Lives alone in [place].  Widowed in 2012 (husband w/ dementia & parkinsons)..   Not active, does not drive due to vision problems.     Review of Systems: A 12 point ROS discussed and pertinent positives are indicated in the HPI above.  All other systems are negative.  Review of Systems  Vital Signs: BP 125/76 (BP Location: Right Wrist)   Pulse 81   Temp 97.8 F (36.6 C) (Oral)   Resp 20   Ht '5\' 6"'  (1.676 m)   Wt 101.9 kg   SpO2 98%   BMI 36.26 kg/m   Physical Exam Constitutional:      Appearance: Normal appearance.  HENT:      Mouth/Throat:     Mouth: Mucous membranes are moist.     Pharynx: Oropharynx is clear.  Cardiovascular:     Rate and Rhythm: Normal rate. Rhythm irregular.     Heart sounds: Normal heart sounds.  Abdominal:     General: Abdomen is flat. There is no distension.     Palpations: Abdomen is soft.     Tenderness: There is no abdominal tenderness.     Comments: RUQ drain intact, good bilious output  Neurological:     General: No focal deficit present.     Mental Status: She is alert and oriented to person, place, and time.  Psychiatric:        Mood and Affect: Mood normal.        Judgment: Judgment normal.     Imaging: Ct Head Wo Contrast  Result Date: 02/01/2018 CLINICAL DATA:  Fall.  No loss of consciousness. EXAM: CT HEAD WITHOUT CONTRAST CT CERVICAL SPINE WITHOUT CONTRAST TECHNIQUE: Multidetector CT imaging of the head and cervical spine was performed following the standard protocol without intravenous contrast. Multiplanar CT image reconstructions of the cervical spine were also generated. COMPARISON:  None. FINDINGS: CT HEAD FINDINGS Brain: Mild chronic ischemic white matter disease is noted. No mass effect or midline shift is noted. Ventricular size is within normal limits. There is no evidence of mass lesion, hemorrhage or acute infarction. Vascular: No hyperdense vessel or unexpected calcification. Skull: Normal. Negative for fracture or focal lesion. Sinuses/Orbits: Right maxillary mucous retention cyst is noted. Other: None. CT CERVICAL SPINE FINDINGS Alignment: Normal. Skull base and vertebrae: No acute fracture. No primary bone lesion or focal pathologic process. Soft tissues and spinal canal: No prevertebral fluid or swelling. No visible canal hematoma. Disc levels:  Status post surgical anterior fusion of C5-6 and C6-7. Upper chest: Negative. Other: Degenerative  changes are seen involving posterior facet joints bilaterally. IMPRESSION: Mild chronic ischemic white matter disease. No  acute intracranial abnormality seen. Postsurgical and degenerative changes are noted in the cervical spine as described above. No acute fracture or spondylolisthesis is noted. Electronically Signed   By: Marijo Conception, M.D.   On: 02/01/2018 07:21   Ct Angio Chest Pe W Or Wo Contrast  Result Date: 02/07/2018 CLINICAL DATA:  Dyspnea, wheezing, history asthma, CHF, worsened symptoms since yesterday, hyperventilation, abdominal distension; history of biliary dilatation and central biliary obstruction post percutaneous drainage EXAM: CT ANGIOGRAPHY CHEST CT ABDOMEN AND PELVIS WITH CONTRAST TECHNIQUE: Multidetector CT imaging of the chest was performed using the standard protocol during bolus administration of intravenous contrast. Multiplanar CT image reconstructions and MIPs were obtained to evaluate the vascular anatomy. Multidetector CT imaging of the abdomen and pelvis was performed using the standard protocol during bolus administration of intravenous contrast. CONTRAST:  176m ISOVUE-370 IOPAMIDOL (ISOVUE-370) INJECTION 76% IV. COMPARISON:  CT abdomen and pelvis 02/01/2018 FINDINGS: CTA CHEST FINDINGS Cardiovascular: Atherosclerotic calcifications of aorta, proximal great vessels and coronary arteries. Cardiac chambers appear mildly enlarged. No pericardial effusion. Pulmonary arteries adequately opacified and patent. No evidence of pulmonary embolism. Mediastinum/Nodes: Base of cervical region normal appearance. No thoracic adenopathy. Esophagus unremarkable. Lungs/Pleura: Small focus of infiltrate or atelectasis at anterior LEFT upper lobe image 51. Multiple tiny BILATERAL lung nodules, noncalcified. 7 mm LEFT lower lobe nodule image 50. Remaining lungs clear. No infiltrate, pleural effusion or pneumothorax Musculoskeletal: Osseous demineralization. Prior cervical spine fusion. No definite bone lesions. Review of the MIP images confirms the above findings. CT ABDOMEN and PELVIS FINDINGS Hepatobiliary:  Percutaneous biliary drain decompression of the RIGHT biliary system since previous exam. Persistent dilatation of the intrahepatic biliary radicles of the LEFT lobe. No focal hepatic mass lesion or visualized obstructing mass at the central biliary tree. Gallbladder surgically absent. Pancreas: Normal appearance Spleen: Normal appearance Adrenals/Urinary Tract: Adrenal glands normal appearance. Tiny cyst at inferior pole RIGHT kidney. Kidneys, ureters, and bladder otherwise unremarkable. Stomach/Bowel: Appendix surgically absent by history. Dense retained contrast in transverse colon. Stomach decompressed. Stomach and bowel loops otherwise unremarkable. Vascular/Lymphatic: Atherosclerotic calcifications of aorta and iliac arteries. Aorta normal caliber. No adenopathy. Reproductive: Uterus surgically absent with nonvisualization of ovaries Other: No free air or free fluid. No hernia or acute inflammatory process. Musculoskeletal: No acute osseous lesions. RIGHT hip prosthesis noted. Degenerative disc and facet disease changes lumbar spine. Review of the MIP images confirms the above findings. IMPRESSION: No evidence of pulmonary embolism. Multiple small BILATERAL noncalcified pulmonary nodules measuring up to 7 mm in largest diameter in LEFT lobe, nonspecific but pulmonary metastases are not excluded. Persistent intrahepatic biliary dilatation involving the LEFT lobe of the liver with decreased biliary dilatation of the RIGHT lobe biliary tree post percutaneous drainage. Scattered atherosclerotic calcifications including within coronary arteries. Electronically Signed   By: MLavonia DanaM.D.   On: 02/07/2018 17:33   Ct Cervical Spine Wo Contrast  Result Date: 02/01/2018 CLINICAL DATA:  Fall.  No loss of consciousness. EXAM: CT HEAD WITHOUT CONTRAST CT CERVICAL SPINE WITHOUT CONTRAST TECHNIQUE: Multidetector CT imaging of the head and cervical spine was performed following the standard protocol without  intravenous contrast. Multiplanar CT image reconstructions of the cervical spine were also generated. COMPARISON:  None. FINDINGS: CT HEAD FINDINGS Brain: Mild chronic ischemic white matter disease is noted. No mass effect or midline shift is noted. Ventricular size is within normal limits. There is no evidence of  mass lesion, hemorrhage or acute infarction. Vascular: No hyperdense vessel or unexpected calcification. Skull: Normal. Negative for fracture or focal lesion. Sinuses/Orbits: Right maxillary mucous retention cyst is noted. Other: None. CT CERVICAL SPINE FINDINGS Alignment: Normal. Skull base and vertebrae: No acute fracture. No primary bone lesion or focal pathologic process. Soft tissues and spinal canal: No prevertebral fluid or swelling. No visible canal hematoma. Disc levels:  Status post surgical anterior fusion of C5-6 and C6-7. Upper chest: Negative. Other: Degenerative changes are seen involving posterior facet joints bilaterally. IMPRESSION: Mild chronic ischemic white matter disease. No acute intracranial abnormality seen. Postsurgical and degenerative changes are noted in the cervical spine as described above. No acute fracture or spondylolisthesis is noted. Electronically Signed   By: Marijo Conception, M.D.   On: 02/01/2018 07:21   Mr Abdomen W PF Contrast  Result Date: 02/12/2018 CLINICAL DATA:  Painless jaundice. Weight loss. Obstructive mass suspected in the LEFT hepatic lobe. EXAM: MRI ABDOMEN WITHOUT AND WITH CONTRAST TECHNIQUE: Multiplanar multisequence MR imaging of the abdomen was performed both before and after the administration of intravenous contrast. CONTRAST:  10 mL Gadavist COMPARISON:  CT abdomen 02/01/2018, 02/07/2018 FINDINGS: Lower chest: Hepatobiliary: Focal duct dilatation in the lateral segment of the LEFT hepatic lobe. There is a ill-defined region of high signal intensity on T2 weighted imaging at the confluence of this biliary obstruction measuring approximately 4.6  x 2.8 cm (image 17/5). Mass lesion at the junction of the LEFT hepatic lobe and RIGHT hepatic lobe. The mass is not well-defined on the post-contrast imaging. No lesion evident within the RIGHT hepatic lobe of the liver. Biliary stent from a percutaneous approach in the RIGHT hepatic lobe noted. Pancreas: No pancreatic inflammation or duct dilatation. Cystic lesion in the mid body pancreas measuring 8 mm (image 27/5) may communicate with ductal the duct is not enlarged. Spleen: Normal spleen. Adrenals/urinary tract: Adrenal glands and kidneys are normal. Stomach/Bowel: Stomach and limited of the small bowel is unremarkable Vascular/Lymphatic: Abdominal aortic normal caliber. No retroperitoneal periportal lymphadenopathy. Musculoskeletal: No aggressive osseous lesion IMPRESSION: 1. Ill-defined mass in the central LEFT hepatic lobe is presumed etiology of the severe LEFT hepatic lobe biliary obstruction. Lesion best depicted on T2 weighted imaging 2. No clear evidence of lesion within the RIGHT hepatic lobe. 3. Common bile duct normal. 4. No pancreatic duct dilatation. 5. Cystic lesion in the body pancreas could represent a side branch IP MT. Recommend attention on routine follow-up. Electronically Signed   By: Suzy Bouchard M.D.   On: 02/12/2018 13:23   Ct Abdomen Pelvis W Contrast  Result Date: 02/07/2018 CLINICAL DATA:  Dyspnea, wheezing, history asthma, CHF, worsened symptoms since yesterday, hyperventilation, abdominal distension; history of biliary dilatation and central biliary obstruction post percutaneous drainage EXAM: CT ANGIOGRAPHY CHEST CT ABDOMEN AND PELVIS WITH CONTRAST TECHNIQUE: Multidetector CT imaging of the chest was performed using the standard protocol during bolus administration of intravenous contrast. Multiplanar CT image reconstructions and MIPs were obtained to evaluate the vascular anatomy. Multidetector CT imaging of the abdomen and pelvis was performed using the standard protocol  during bolus administration of intravenous contrast. CONTRAST:  174m ISOVUE-370 IOPAMIDOL (ISOVUE-370) INJECTION 76% IV. COMPARISON:  CT abdomen and pelvis 02/01/2018 FINDINGS: CTA CHEST FINDINGS Cardiovascular: Atherosclerotic calcifications of aorta, proximal great vessels and coronary arteries. Cardiac chambers appear mildly enlarged. No pericardial effusion. Pulmonary arteries adequately opacified and patent. No evidence of pulmonary embolism. Mediastinum/Nodes: Base of cervical region normal appearance. No thoracic adenopathy. Esophagus unremarkable. Lungs/Pleura:  Small focus of infiltrate or atelectasis at anterior LEFT upper lobe image 51. Multiple tiny BILATERAL lung nodules, noncalcified. 7 mm LEFT lower lobe nodule image 50. Remaining lungs clear. No infiltrate, pleural effusion or pneumothorax Musculoskeletal: Osseous demineralization. Prior cervical spine fusion. No definite bone lesions. Review of the MIP images confirms the above findings. CT ABDOMEN and PELVIS FINDINGS Hepatobiliary: Percutaneous biliary drain decompression of the RIGHT biliary system since previous exam. Persistent dilatation of the intrahepatic biliary radicles of the LEFT lobe. No focal hepatic mass lesion or visualized obstructing mass at the central biliary tree. Gallbladder surgically absent. Pancreas: Normal appearance Spleen: Normal appearance Adrenals/Urinary Tract: Adrenal glands normal appearance. Tiny cyst at inferior pole RIGHT kidney. Kidneys, ureters, and bladder otherwise unremarkable. Stomach/Bowel: Appendix surgically absent by history. Dense retained contrast in transverse colon. Stomach decompressed. Stomach and bowel loops otherwise unremarkable. Vascular/Lymphatic: Atherosclerotic calcifications of aorta and iliac arteries. Aorta normal caliber. No adenopathy. Reproductive: Uterus surgically absent with nonvisualization of ovaries Other: No free air or free fluid. No hernia or acute inflammatory process.  Musculoskeletal: No acute osseous lesions. RIGHT hip prosthesis noted. Degenerative disc and facet disease changes lumbar spine. Review of the MIP images confirms the above findings. IMPRESSION: No evidence of pulmonary embolism. Multiple small BILATERAL noncalcified pulmonary nodules measuring up to 7 mm in largest diameter in LEFT lobe, nonspecific but pulmonary metastases are not excluded. Persistent intrahepatic biliary dilatation involving the LEFT lobe of the liver with decreased biliary dilatation of the RIGHT lobe biliary tree post percutaneous drainage. Scattered atherosclerotic calcifications including within coronary arteries. Electronically Signed   By: Lavonia Dana M.D.   On: 02/07/2018 17:33   Ct Abdomen Pelvis W Contrast  Result Date: 02/01/2018 CLINICAL DATA:  85 year old female with history of jaundice. Dark urine. EXAM: CT ABDOMEN AND PELVIS WITH CONTRAST TECHNIQUE: Multidetector CT imaging of the abdomen and pelvis was performed using the standard protocol following bolus administration of intravenous contrast. CONTRAST:  176m OMNIPAQUE IOHEXOL 300 MG/ML  SOLN COMPARISON:  No priors. FINDINGS: Lower chest: Aortic atherosclerosis. Atherosclerotic calcifications in the right coronary artery. Hepatobiliary: In the left lobe of the liver there is a central lesion that is poorly defined but estimated to measure approximately 5.5 x 3.3 x 3.6 cm (axial image 18 of series 3 and coronal image 51 of series 6). This is associated with severe intrahepatic biliary ductal dilatation, which is most severe throughout segments 2 and 3, but involves all areas of the liver. Status post cholecystectomy. Common bile duct is not dilated. Pancreas: No pancreatic mass. No pancreatic ductal dilatation. No pancreatic or peripancreatic fluid or inflammatory changes. Spleen: Unremarkable. Adrenals/Urinary Tract: 1.4 cm with simple cyst in the lower pole of the right kidney. Left kidney is normal in appearance. No  hydroureteronephrosis. Urinary bladder is normal in appearance. Bilateral adrenal glands are normal in appearance. Stomach/Bowel: Normal appearance of the stomach. No pathologic dilatation of small bowel or colon. Normal appendix. Vascular/Lymphatic: Aortic atherosclerosis, without evidence of aneurysm or dissection in the abdominal or pelvic vasculature. No lymphadenopathy noted in the abdomen or pelvis. Reproductive: Status post hysterectomy. Ovaries are not confidently identified and may be surgically absent or atrophic. Other: No significant volume of ascites.  No pneumoperitoneum. Musculoskeletal: Status post right hip arthroplasty. There are no aggressive appearing lytic or blastic lesions noted in the visualized portions of the skeleton. IMPRESSION: 1. Ill-defined central hepatic mass in the left lobe of the liver estimated to measure approximately 5.5 x 3.3 x 3.6 cm with associated  severe intrahepatic biliary ductal dilatation. Findings are highly concerning for cholangiocarcinoma. 2. Aortic atherosclerosis, in addition to at least right coronary artery disease. 3. Additional incidental findings, as above. Electronically Signed   By: Vinnie Langton M.D.   On: 02/01/2018 14:53   Dg Chest Port 1 View  Result Date: 02/08/2018 CLINICAL DATA:  85 year old female with shortness breath for the past week. Subsequent encounter. EXAM: PORTABLE CHEST 1 VIEW COMPARISON:  02/07/2018 chest CT and chest x-ray. FINDINGS: Mild cardiomegaly. Central pulmonary vascular prominence. No segmental consolidation or pneumothorax. CT detected pulmonary nodules not appreciated on present plain film exam. Calcified aorta. Calcified left carotid bifurcation. Bilateral acromioclavicular joint degenerative changes. Prior surgery lower cervical spine. IMPRESSION: 1. Mild cardiomegaly. 2. Mild central pulmonary vascular prominence. 3. Blunting left costophrenic angle similar to prior exams and may be related to prominent epicardial fat  pad/subsegmental atelectasis as seen on CT. 4. CT detected pulmonary nodules not appreciated on present plain film exam. Electronically Signed   By: Genia Del M.D.   On: 02/08/2018 07:05   Dg Chest Port 1 View  Result Date: 02/07/2018 CLINICAL DATA:  Shortness of breath with altered mental status. EXAM: PORTABLE CHEST 1 VIEW COMPARISON:  02/09/2015 FINDINGS: Lungs are adequately inflated without focal airspace consolidation or effusion. Cardiomediastinal silhouette and remainder of the exam is unchanged. IMPRESSION: No active disease. Electronically Signed   By: Marin Olp M.D.   On: 02/07/2018 12:37   Dg Abdomen Acute W/chest  Result Date: 02/16/2018 CLINICAL DATA:  Nausea, vomiting, diarrhea and abdominal pain. EXAM: DG ABDOMEN ACUTE W/ 1V CHEST COMPARISON:  Portable chest dated 02/08/2018. Chest CTA and abdomen and pelvis CT dated 06/2018. Abdomen MR dated 02/12/2018. FINDINGS: Normal sized heart. Clear lungs with normal vascularity. Left breast surgical clips. Cervical spine fixation hardware. Normal bowel gas pattern without free peritoneal air. Stable percutaneous biliary drainage catheter and right hip prosthesis. Lumbar and thoracic spine degenerative changes. IMPRESSION: No acute abnormality. Electronically Signed   By: Claudie Revering M.D.   On: 02/16/2018 17:35   Dg Hand Complete Right  Result Date: 02/01/2018 CLINICAL DATA:  Status post dislocation at the PIP joint of the index finger of the right hand. Post reduction views. EXAM: RIGHT HAND - COMPLETE 3+ VIEW COMPARISON:  Prevertebral in image of today's date at 6:29 a.m. FINDINGS: The previously dislocated PIP joint is now in normal alignment. There is a small bony density adjacent to the dorso-ulnar aspect of the head of the proximal phalanx of the index finger which may reflect an avulsion fracture. The PIP joint space is well maintained. There is severe osteoarthritic joint space loss with surrounding proliferative changes involving  the DIP joint of the second as well as third, fourth, and fifth fingers. There are degenerative changes of the first [facility] joint. IMPRESSION: Successful relocation of the previously laterally dislocated index finger at the PIP joint. Possible small avulsion from the ulnar aspect of the head of the proximal phalanx. Significant DIP joint osteoarthritic change of the second through fifth digits and of the first [facility] joint. Electronically Signed   By: David  Martinique M.D.   On: 02/01/2018 08:01   Dg Hand Complete Right  Result Date: 02/01/2018 CLINICAL DATA:  Status post fall, hitting right hand on tub, with deformity of the right index finger. Initial encounter. EXAM: RIGHT HAND - COMPLETE 3+ VIEW COMPARISON:  None. FINDINGS: There is dislocation at the second proximal interphalangeal joint, with ulnar and dorsal angulation. No definite fracture is characterized.  Degenerative change at the first carpometacarpal joint, and at the distal interphalangeal joints, raises concern for osteoarthritis. No definite soft tissue abnormalities are characterized on radiograph. Negative ulnar variance is noted. IMPRESSION: Dislocation at the second proximal interphalangeal joint, with ulnar and dorsal angulation. No definite fracture seen. Electronically Signed   By: Garald Balding M.D.   On: 02/01/2018 06:49   Ct Renal Stone Study  Result Date: 02/16/2018 CLINICAL DATA:  Flank pain. Renal insufficiency. Nausea and vomiting. EXAM: CT ABDOMEN AND PELVIS WITHOUT CONTRAST TECHNIQUE: Multidetector CT imaging of the abdomen and pelvis was performed following the standard protocol without IV contrast. COMPARISON:  Chest, abdomen and pelvis radiographs obtained earlier today. Chest CTA and abdomen and pelvis CT dated 02/07/2018. Abdomen and pelvis CT dated 02/01/2018. FINDINGS: Lower chest: Multiple small nodules at both lung bases. The largest is in the right lower lobe, measuring 5 mm on image number 26 series 4. This was not present  on the previous examinations and multiple other nodules are also new on both sides. Hepatobiliary: Previously demonstrated percutaneous biliary drainage catheter, poorly defined left lobe liver mass and markedly dilated left lobe bile ducts. Cholecystectomy clips. Pancreas: Unremarkable. No pancreatic ductal dilatation or surrounding inflammatory changes. Spleen: Normal in size without focal abnormality. Adrenals/Urinary Tract: Normal appearing adrenal glands. Small lower pole right renal cyst. Normal appearing left kidney, ureters and urinary bladder. Portions of the urinary bladder and distal ureters are obscured by streak artifacts from a right hip prosthesis. Stomach/Bowel: Surgically absent appendix. Unremarkable stomach, small bowel and colon. Vascular/Lymphatic: Atheromatous arterial calcifications without aneurysm. No enlarged lymph nodes. Reproductive: Status post hysterectomy. No adnexal masses. Other: No abdominal wall hernia or abnormality. No abdominopelvic ascites. Musculoskeletal: Right hip prosthesis with associated streak artifacts. Lumbar and lower thoracic spine degenerative changes. IMPRESSION: 1. No acute abnormality. 2. Multiple interval small nodules at both lung bases suspicious for metastases. 3. Previously demonstrated left lobe liver mass causing left lobe liver biliary obstruction. Electronically Signed   By: Claudie Revering M.D.   On: 02/16/2018 21:07   Dg Hips Bilat W Or Wo Pelvis 2 Views  Result Date: 02/01/2018 CLINICAL DATA:  Status post fall, with concern for pelvic injury. Initial encounter. EXAM: DG HIP (WITH OR WITHOUT PELVIS) 2V BILAT COMPARISON:  None. FINDINGS: There is no evidence of fracture or dislocation. The patient's right hip arthroplasty is grossly unremarkable in appearance, without evidence of significant loosening. The proximal left femur appears intact. Mild degenerative change is noted at the lower lumbar spine. The sacroiliac joints are unremarkable in  appearance. The visualized bowel gas pattern is grossly unremarkable in appearance. Scattered phleboliths are noted within the pelvis. IMPRESSION: No evidence of fracture or dislocation. Right hip arthroplasty is grossly unremarkable in appearance. Electronically Signed   By: Garald Balding M.D.   On: 02/01/2018 06:51   Ir Int Lianne Cure Biliary Drain With Cholangiogram  Result Date: 02/02/2018 INDICATION: 85 year old with obstructive jaundice. Plan for percutaneous transhepatic cholangiogram with biliary drain placement. EXAM: PERCUTANEOUS TRANSHEPATIC CHOLANGIOGRAM WITH ULTRASOUND AND FLUOROSCOPIC GUIDANCE PLACEMENT OF INTERNAL/EXTERNAL BILIARY DRAIN MEDICATIONS: Zosyn 3.375 g; The antibiotic was administered within an appropriate time frame prior to the initiation of the procedure. ANESTHESIA/SEDATION: Moderate (conscious) sedation was employed during this procedure. A total of Versed 2.0 mg and Fentanyl 200 mcg was administered intravenously. Moderate Sedation Time: 68 minutes. The patient's level of consciousness and vital signs were monitored continuously by radiology nursing throughout the procedure under my direct supervision. FLUOROSCOPY TIME:  Fluoroscopy Time: 11  minutes 42 seconds (125 mGy). CONTRAST:  40 mL [id number] COMPLICATIONS: None immediate. PROCEDURE: Informed written consent was obtained from the patient after a thorough discussion of the procedural risks, benefits and alternatives. All questions were addressed. Maximal Sterile Barrier Technique was utilized including caps, mask, sterile gowns, sterile gloves, sterile drape, hand hygiene and skin antiseptic. A timeout was performed prior to the initiation of the procedure. Patient was supine on the interventional table. The anterior and right abdomen was prepped and draped in sterile fashion. Ultrasound demonstrated dilated intrahepatic bile ducts. Initially, attention was directed to the left hepatic ducts. Skin was anesthetized with 1%  lidocaine. Using ultrasound guidance, 21 gauge needle directed into dilated ducts but difficult to opacify the left lobe ducts. Wire would not advance centrally. As a result, attention was directed to the right hepatic lobe. Skin was anesthetized with 1% lidocaine. Using ultrasound guidance, a dilated right hepatic duct was punctured with a 21 gauge needle. Contrast injection confirmed placement in the biliary system. A 0.018 wire was advanced and Accustick dilator set was placed. A 5 French Kumpe catheter was advanced into the biliary system and additional cholangiogram was performed. Eventually, the central biliary system was successfully cannulated and a Glidewire was advanced into the extrahepatic bile duct. Additional cholangiograms were performed. Stiff Amplatz wire was advanced into the duodenum. Tract was dilated to accommodate a 10 [place] biliary drain. Large volume of yellow bilious fluid was removed from the biliary system. Samples sent for culture and cytology. Catheter was sutured to skin and attached to a gravity bag. Ultrasound confirmed that the left hepatic ducts were still dilated after placement of biliary drain. 21 gauge needle again directed into a dilated ducts with ultrasound but a wire would not advance centrally. FINDINGS: Severe intrahepatic biliary dilatation. Access was obtained from right hepatic lobe. Obstruction at the central hepatic ducts. Distal common bile duct is patent. Right biliary system was decompressed after drain placement. Persistent left biliary dilatation after placement of the right biliary drain. Left biliary drain was attempted but unsuccessful due to configuration of the obstruction. IMPRESSION: Central biliary obstruction. Findings are suggestive for a neoplastic process such as a cholangiocarcinoma. The obstruction was successfully crossed from a right hepatic approach and the right biliary system was decompressed with a biliary drain. Dilated left biliary system  and unable to decompress the left biliary system at this time. Recommend a repeat CT or MRI after few days of biliary drainage in order to re-evaluate the central hepatic lesion and left biliary obstruction. 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 Fluid was sent for cytology and culture. Electronically Signed   By: Markus Daft M.D.   On: 02/02/2018 11:56   Ir Exchange Biliary Drain  Result Date: 02/09/2018 INDICATION: 85 year old female with a history of central obstructing mass in the liver with biliary ductal dilatation and jaundice, initially treated with right-sided PTC and drainage 02/02/2018. She presents today for repositioning of the drain which has been partially withdrawn and a brush biopsy EXAM: ULTRASOUND-GUIDED THROUGH THE TUBE CHOLANGIOGRAM AND EXCHANGE OF WITHDRAWN BILIARY TUBE PLACEMENT OF NEW 12 FRENCH DRAIN ENDOLUMINAL BRUSH BIOPSY MEDICATIONS: None ANESTHESIA/SEDATION: Moderate (conscious) sedation was employed during this procedure. A total of Versed 2.5 mg and Fentanyl 125 mcg was administered intravenously. Moderate Sedation Time: 25 minutes. The patient's level of consciousness and vital signs were monitored continuously by radiology nursing throughout the procedure under my direct supervision. FLUOROSCOPY TIME:  Fluoroscopy Time:  6 minutes 18 seconds (192 mGy). COMPLICATIONS: None PROCEDURE: Informed written consent was obtained from the patient after a thorough discussion of the procedural risks, benefits and alternatives. All questions were addressed. Maximal Sterile Barrier Technique was utilized including caps, mask, sterile gowns, sterile gloves, sterile drape, hand hygiene and skin antiseptic. A timeout was performed prior to the initiation of the procedure. Patient positioned supine position on the fluoroscopy table. Patient is prepped and draped in the usual sterile fashion. Scout images were acquired of the upper  abdomen. Through the tube cholangiogram was performed demonstrating withdrawal of the previously existing 10 [place] biliary drain. A Glidewire was navigated into the duodenum through the existing tract. Drain was removed from the [employer]. A short Kumpe the catheter was then navigated into the duodenum. Rose in wire was placed through the Kumpe the catheter and the Kumpe the catheter was withdrawn. A 7 French 35 cm bright tip sheath was then placed over the Rose an wire into the biliary system. Introducer was withdrawn and a pull-back cholangiogram was performed. The targeted narrowing was just cephalad of the clips in the liver hilum, at a region of ductal narrowing in the presumed area of tumor. Introducer was placed and the sheath was then again pushed beyond the occlusion. Coaxial cytology brush biopsy kit was advanced through the sheath, parallel to the safety wire. The sheath was then withdrawn and the brush biopsy kit was withdrawn into the region of narrowing with agitation across the lesion. Brush was then withdrawn placed into saline comp for cytology. This biopsy was then repeated for a total of 2 brush biopsies. The sheath was then withdrawn and we attempted to place a 12 [place] biliary drain over the Rose an wire. The tract required 12 French dilation. The 12 French drain was again attempted to place over the [facility] an wire, unsuccessful given the angle of approach through the abdominal wall. Kumpe the catheter was replaced to the duodenum and the Rose an wire was exchanged for stiff Amplatz 160 cm wire. We were then successful with placing the 12 [place] biliary drain over the wire into the duodenum, with the proximal side holes positioned just proximal to the presumed site of occlusion/tumor. Drain was sutured in position.  Final images were stored. Gravity drain was attached. Patient tolerated the procedure well and remained hemodynamically stable throughout. No complications were encountered and no  significant blood loss. IMPRESSION: Status post routine exchange and up sizing of PTC/drainage with a new 12 French drain placed. 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 Signed, Dulcy Fanny. Dellia Nims, RPVI [facility] Radiology Electronically Signed   By: Corrie Mckusick D.O.   On: 02/09/2018 10:41   Ir Endoluminal Bx Of Biliary Tree  Result Date: 02/09/2018 INDICATION: 85 year old female with a history of central obstructing mass in the liver with biliary ductal dilatation and jaundice, initially treated with right-sided PTC and drainage 02/02/2018. She presents today for repositioning of the drain which has been partially withdrawn and a brush biopsy EXAM: ULTRASOUND-GUIDED THROUGH THE TUBE CHOLANGIOGRAM AND EXCHANGE OF WITHDRAWN BILIARY TUBE PLACEMENT OF NEW 12 FRENCH DRAIN ENDOLUMINAL BRUSH BIOPSY MEDICATIONS: None ANESTHESIA/SEDATION: Moderate (conscious) sedation was employed during this procedure. A total of Versed 2.5 mg and Fentanyl 125 mcg was administered intravenously. Moderate Sedation Time: 25 minutes. The patient's level of consciousness and vital signs were monitored continuously by radiology nursing throughout the procedure under my direct supervision. FLUOROSCOPY TIME:  Fluoroscopy Time: 6 minutes 18 seconds (192 mGy). COMPLICATIONS: None PROCEDURE: Informed written consent was obtained from the patient after a thorough discussion of the procedural risks, benefits and alternatives. All questions were addressed. Maximal Sterile Barrier Technique was utilized including caps, mask, sterile gowns, sterile gloves, sterile drape, hand hygiene and skin antiseptic. A timeout was performed prior to the initiation of the procedure. Patient positioned supine position on the fluoroscopy table. Patient is prepped and draped in the usual sterile fashion. Scout images were acquired of the upper abdomen. Through  the tube cholangiogram was performed demonstrating withdrawal of the previously existing 10 [place] biliary drain. A Glidewire was navigated into the duodenum through the existing tract. Drain was removed from the [employer]. A short Kumpe the catheter was then navigated into the duodenum. Rose in wire was placed through the Kumpe the catheter and the Kumpe the catheter was withdrawn. A 7 French 35 cm bright tip sheath was then placed over the Rose an wire into the biliary system. Introducer was withdrawn and a pull-back cholangiogram was performed. The targeted narrowing was just cephalad of the clips in the liver hilum, at a region of ductal narrowing in the presumed area of tumor. Introducer was placed and the sheath was then again pushed beyond the occlusion. Coaxial cytology brush biopsy kit was advanced through the sheath, parallel to the safety wire. The sheath was then withdrawn and the brush biopsy kit was withdrawn into the region of narrowing with agitation across the lesion. Brush was then withdrawn placed into saline comp for cytology. This biopsy was then repeated for a total of 2 brush biopsies. The sheath was then withdrawn and we attempted to place a 12 [place] biliary drain over the Rose an wire. The tract required 12 French dilation. The 12 French drain was again attempted to place over the [facility] an wire, unsuccessful given the angle of approach through the abdominal wall. Kumpe the catheter was replaced to the duodenum and the Rose an wire was exchanged for stiff Amplatz 160 cm wire. We were then successful with placing the 12 [place] biliary drain over the wire into the duodenum, with the proximal side holes positioned just proximal to the presumed site of occlusion/tumor. Drain was sutured in position.  Final images were stored. Gravity drain was attached. Patient tolerated the procedure well and remained hemodynamically stable throughout. No complications were encountered and no significant blood  loss. IMPRESSION: Status post routine exchange and up sizing of PTC/drainage with a new 12 French drain placed. 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 Signed, Dulcy Fanny. Dellia Nims, RPVI [facility] Radiology Electronically Signed   By: Corrie Mckusick D.O.   On: 02/09/2018 10:41    Labs:  CBC: Recent Labs    02/09/18 1136 02/10/18 0642 02/16/18 1820 02/17/18 0531  WBC 17.0* 12.1* 28.3* 23.3*  HGB 10.5* 10.3* 13.9 13.0  HCT 34.0* 34.3* 44.3 41.2  PLT 500* 447* 570* 478*    COAGS: Recent Labs    02/01/18 1712 02/03/18 0512 02/08/18 0531 02/08/18 1140 02/09/18 0512 02/09/18 2236 02/16/18 1820  INR 1.16  --   --   --  1.10  --  1.49  APTT  --  60* 84* 91*  --  35  --     BMP: Recent Labs    02/09/18 1136 02/10/18 0642 02/16/18 1820 02/17/18 0531  NA 138 138 128* 130*  K 4.1 4.3 6.1* 5.2*  CL 101 101 88* 86*  CO2 27 27 18* 18*  GLUCOSE 111* 82 122* 110*  BUN 40* 37* 100* 105*  CALCIUM 8.8* 8.8* 8.9 8.4*  CREATININE 1.04* 0.87 5.29* 5.67*  GFRNONAA 50* >60 7* 6*  GFRAA 58* >60 8* 7*    LIVER FUNCTION TESTS: Recent Labs    02/11/18 0714 02/12/18 0628 02/16/18 1820 02/17/18 0531  BILITOT 2.4* 2.0* 2.5* 2.6*  AST 60* 59* 372* 290*  ALT 158* 151* 501* 505*  ALKPHOS 347* 340* 325* 281*  PROT 6.3* 6.7 8.4* 7.6  ALBUMIN 2.2* 2.6* 3.6 3.6    TUMOR MARKERS: No results for input(s): AFPTM, CEA, CA199, CHROMGRNA in the last 8760 hours.  Assessment and Plan: Obstructive jaundice. S/p right sided I/E bili drain. Overall, her LFTs and Bili have not gone up since discharge. However, left sided ductal dilatation remains. Will attempt PTC with possible 2nd biliary drain from the left side Hepatic mass. Poorly defined and originally felt not amenable to percutaneous biopsy given location, will attempt percutaneous biopsy today in hopes to obtain tissue  diagnosis Labs reviewed. Xarelto has been held since 1/13. No Lovenox given today, pt has been NPO Risks and benefits of PTC/bili drain/biopsy discussed with the patient including, but not limited to bleeding, infection which may lead to sepsis or even death and damage to adjacent structures.  All of the patient's questions were answered, patient is agreeable to proceed.  Consent signed and in chart.     Thank you for this interesting consult.  I greatly enjoyed meeting Jordan Byrd and look forward to participating in their care.  A copy of this report was sent to the requesting provider on this date.  Electronically Signed: Ascencion Dike, PA-C 02/17/2018, 11:54 AM   I spent a total of 20 minutes in face to face in clinical consultation, greater than 50% of which was counseling/coordinating care for obstructive jaundice

## 2018-02-17 NOTE — Progress Notes (Signed)
Reviewed the chart and agree with HPI I have discussed with interventional radiology PA Mr. Ascencion Dike  85 year old female Recent admit 12/30-1/10 for obstructive painless jaundice and liver mass with concern for cholangiocarcinoma-at that admission she was found to have left lower lobe lesion IR evaluated after GI saw had 10 French internal/external drain to right liver lobe-[id number] Ca1 2567 CEA 4-she had a nonfunctioning drain on 1/5 associated with severe pain and had replacement of drain 1/7 no further findings-it was felt despite negative brushings that likely cholangiocarcinoma and less likely hepatocellular--- Dr. Alen Blew consulted + felt more palliative XRT chemo  She returns with adult failure to thrive nausea vomiting and a creatinine of 5 in the setting of these issues she has not been eating and drinking her discharge creatinine was 37/0.8 In addition she had another CT showing ill-defined hepatic lesion and significant dilation of the left hepatic ductal system and hence IR was consulted  On exam BP 126/62 (BP Location: Right Arm)   Pulse 78   Temp 97.8 F (36.6 C) (Oral)   Resp 18   Ht 5\' 6"  (1.676 m)   Wt 101.9 kg   SpO2 99%   BMI 36.26 kg/m  Awake alert pleasant no distress no pain eating drinking without any deficit No chest pain no fever no chills  EOMI NCAT no icterus no pallor Neck soft supple Chest clear No reproducible tenderness to the chest wall Abdomen is soft without rebound or guarding No lower extremity edema   We will follow recommendations from nephrology this is likely hemodynamically mediated I agree and I expect with fluid and time ATN will resolve She is scheduled for biopsy later today I had a brief discussion with the daughter at the bedside who understands the plans I will place her oncologist Dr. Alen Blew on the treatment team  Verneita Griffes, MD [facility] 2:11 PM

## 2018-02-17 NOTE — Procedures (Signed)
Interventional Radiology Procedure:   Indications: History of obstructive jaundice and s/p right biliary drain.  Still needs tissue diagnosis and poor defined mass in left hepatic lobe.   Procedure: Image guided left hepatic mass biopsy with [place] and CT  Findings: Poorly defined lesion adjacent to dilated left biliary ducts.  3 cores obtained  Complications: None     EBL: Minimal  Plan: Bedrest 3 hours.   Follow labs.     Jordan Byrd R. Anselm Pancoast, MD  Pager: [phone]

## 2018-02-17 NOTE — H&P (Signed)
History and Physical    Jordan Byrd QJJ:[id number] DOB: [birth year] DOA: 02/16/2018  PCP: Levin Erp, MD  Patient coming from: Skilled nursing facility.  Chief Complaint: Abnormal labs.  HPI: Jordan Byrd is a 85 y.o. female with history of CHF, atrial fibrillation, hypertension, anemia who was recently admitted for obstructive jaundice and had drain placed by IR and discharged to rehab last week had routine labs drawn when patient's creatinine was found to be markedly increased around 5.  Patient was referred to the ER.  During the last admission about 2 weeks ago patient had drain placed and has had high output drain and also was treated for CHF with Lasix.  At the time patient's ARB was also discontinued.  Patient states since discharge patient has not been eating well.  Hardly noticed any urine output.  ED Course: In the ER patient had CT of the abdomen and pelvis without contrast which shows no obstructive features of urinary tract.  Does show left liver lobe mass with some obstruction.  Creatinine was around 5.2 with potassium of 6.1 bicarb of 18.  WBC count was 28,000.  Patient's LFTs also has increased from almost normal last week to AST of 372 ALT of 501 total bilirubin of 2.1.  On-call nephrologist was consulted and at this time they recommended IV fluids and correction of potassium.  Patient admitted for further management of acute renal failure with hyperkalemia.  Review of Systems: As per HPI, rest all negative.   Past Medical History:  Diagnosis Date  . Anemia   . Arthritis    "all over" (02/01/2018)  . Asthma   . Atrial fibrillation ([place])    on Xarelto  . Breast cancer, left breast ([place]) 05/19/13   left breast stage IIA   . CHF (congestive heart failure) ([place])   . COPD (chronic obstructive pulmonary disease) ([place])   . GERD (gastroesophageal reflux disease)    [name] Prilosec  . Hypertension   . Macular degeneration   . Obstructive jaundice 02/01/2018  . On home  oxygen therapy    "2.5L prn" (02/01/2018)  . Osteoarthritis    a. 03/28/11 - R Total Hip Arthroplasty  . Pleurisy   . Pneumonia    "2-3 times" (02/01/2018)    Past Surgical History:  Procedure Laterality Date  . ABDOMINAL HYSTERECTOMY  1962  . ADENOIDECTOMY  [id number]   "grew back 6 times"  . ANTERIOR CERVICAL DECOMP/DISCECTOMY FUSION    . APPENDECTOMY  1954  . BACK SURGERY    . BREAST LUMPECTOMY WITH NEEDLE LOCALIZATION Left 07/14/2013   Procedure: BREAST LUMPECTOMY WITH NEEDLE LOCALIZATION;  Surgeon: Stark Klein, MD;  Location: [facility];  Service: General;  Laterality: Left;  . BREAST SURGERY    . CARDIAC CATHETERIZATION     1980's or 90's - reportedly normal  . CARDIOVERSION  04/05/2011   Procedure: CARDIOVERSION;  Surgeon: Coralyn Mark, MD;  Location: [facility];  Service: Cardiovascular;  Laterality: N/A;  . CARDIOVERSION N/A 06/19/2012   Procedure: CARDIOVERSION-bedside([id number]);  Surgeon: Lelon Perla, MD;  Location: [facility] OR;  Service: Cardiovascular;  Laterality: N/A;  . CATARACT EXTRACTION BILATERAL W/ ANTERIOR VITRECTOMY Bilateral 2007/2008  . CERVICAL DISC SURGERY    . CHOLECYSTECTOMY OPEN    . COLONOSCOPY     "I don't know but I ain't gonna have no more"  . DILATION AND CURETTAGE OF UTERUS    . EXCISION / BIOPSY BREAST / NIPPLE / DUCT     left breast  .  INCISION AND DRAINAGE Right 1947   "stuck nail in my foot"  . IR ENDOLUMINAL BX OF BILIARY TREE  02/09/2018  . IR EXCHANGE BILIARY DRAIN  02/09/2018  . IR INT EXT BILIARY DRAIN WITH CHOLANGIOGRAM  02/02/2018  . JOINT REPLACEMENT    . KNEE ARTHROSCOPY Bilateral   . LUMBAR DISC SURGERY  10/30/2008   L2-S1  . PLANTAR'S WART EXCISION Bilateral   . SHOULDER OPEN ROTATOR CUFF REPAIR Left   . TONSILLECTOMY AND ADENOIDECTOMY  1943  . TOTAL HIP ARTHROPLASTY  03/28/2011   Procedure: TOTAL HIP ARTHROPLASTY;  Surgeon: Yvette Rack., MD;  Location: [facility];  Service: Orthopedics;  Laterality: Right;     reports that she quit smoking  about 47 years ago. Her smoking use included cigarettes. She has a 21.00 pack-year smoking history. She has never used smokeless tobacco. She reports previous alcohol use. She reports that she does not use drugs.  Allergies  Allergen Reactions  . Methocarbamol Hives  . Vicodin [Hydrocodone-Acetaminophen] Other (See Comments)    Interacts with bp medication and drops blood pressure  . Aspirin Nausea And Vomiting  . Butazolidin [Phenylbutazone] Other (See Comments)    Unknown reaction  . Celebrex [Celecoxib] Nausea And Vomiting  . Codeine Nausea And Vomiting  . Doripenem Rash  . Aspirin Buf(Alhyd-Mghyd-Cacar) Nausea And Vomiting  . Mucinex [Guaifenesin Er] Itching  . Temazepam Other (See Comments)    Unknown reaction    Family History  Problem Relation Age of Onset  . Diabetes Father        Father, Mother, 4 sisters (2 living)  . Heart attack Father        (Deceased)  . Heart failure Father        (deceased 27)  . Hypertension Father   . Stroke Mother        (deceased 39)  . Hypertension Mother   . Cancer Sister 37       breast cancer  . Breast cancer Sister 74  . Heart attack Sister   . Diabetes Sister   . Cancer Other 48       niece with breast cancer    Prior to Admission medications   Medication Sig Start Date End Date Taking? Authorizing Provider  albuterol (PROVENTIL HFA;VENTOLIN HFA) 108 (90 BASE) MCG/ACT inhaler Inhale 1 puff into the lungs every 6 (six) hours as needed for wheezing. Reported on 04/10/2015   Yes [provider]  amLODipine (NORVASC) 10 MG tablet Take 10 mg by mouth daily.   Yes [provider]  budesonide-formoterol (SYMBICORT) 160-4.5 MCG/ACT inhaler Inhale 2 puffs into the lungs See admin instructions. Inhale 1 puff every morning, may inhale a 2nd puff in the evening if needed for wheezing   Yes [provider]  feeding supplement, ENSURE ENLIVE, (ENSURE ENLIVE) LIQD Take 237 mLs by mouth 3 (three) times daily between  meals. 02/12/18  Yes Hongalgi, Lenis Dickinson, MD  Multiple Vitamin (MULTIVITAMIN WITH MINERALS) TABS tablet Take 1 tablet by mouth daily.   Yes [provider]  Nebivolol HCl (BYSTOLIC) 20 MG TABS Take 1 tablet (20 mg total) by mouth daily. 10/24/15  Yes Allred, Jeneen Rinks, MD  nitroGLYCERIN (NITROSTAT) 0.4 MG SL tablet Place 0.4 mg under the tongue every 5 (five) minutes as needed for chest pain.   Yes [provider]  omeprazole (PRILOSEC) 20 MG capsule Take 20 mg by mouth daily.   Yes [provider]  ondansetron (ZOFRAN) 4 MG tablet Take 1  tablet (4 mg total) by mouth every 8 (eight) hours as needed for nausea or vomiting. 02/12/18  Yes Hongalgi, Lenis Dickinson, MD  polyethylene glycol (MIRALAX / GLYCOLAX) packet Take 17 g by mouth 2 (two) times daily. 02/12/18  Yes Hongalgi, Lenis Dickinson, MD  rivaroxaban (XARELTO) 20 MG TABS tablet Take 1 tablet (20 mg total) by mouth daily with supper. 10/24/15  Yes Allred, Jeneen Rinks, MD  senna-docusate (SENOKOT-S) 8.6-50 MG tablet Take 1 tablet by mouth 2 (two) times daily. 02/12/18  Yes Hongalgi, Lenis Dickinson, MD  traMADol (ULTRAM) 50 MG tablet Take 1 tablet (50 mg total) by mouth 2 (two) times daily. 02/12/18  Yes Modena Jansky, MD    Physical Exam: Vitals:   02/16/18 1950 02/16/18 2128 02/16/18 2241 02/16/18 2252  BP: (!) 143/76 135/77 125/76   Pulse: (!) 127 88 81   Resp: (!) 22 (!) 25 20   Temp:   97.8 F (36.6 C)   TempSrc:   Oral   SpO2: 90% 100% 96%   Weight:    101 kg  Height:    5\' 6"  (1.676 m)      Constitutional: Moderately built and nourished. Vitals:   02/16/18 1950 02/16/18 2128 02/16/18 2241 02/16/18 2252  BP: (!) 143/76 135/77 125/76   Pulse: (!) 127 88 81   Resp: (!) 22 (!) 25 20   Temp:   97.8 F (36.6 C)   TempSrc:   Oral   SpO2: 90% 100% 96%   Weight:    101 kg  Height:    5\' 6"  (1.676 m)   Eyes: Anicteric no pallor. ENMT: No discharge from the ears eyes nose or mouth. Neck: No mass or.  No neck rigidity. Respiratory:  No rhonchi or crepitations. Cardiovascular: S1-S2 heard. Abdomen: Soft nontender bowel sounds present. Musculoskeletal: No edema.  No joint effusion. Skin: No rash. Neurologic: Alert awake oriented to time place and person.  Moves all extremities. Psychiatric: Appears normal.  Normal affect.   Labs on Admission: I have personally reviewed following labs and imaging studies  CBC: Recent Labs  Lab 02/10/18 0642 02/16/18 1820  WBC 12.1* 28.3*  NEUTROABS 10.1* 26.0*  HGB 10.3* 13.9  HCT 34.3* 44.3  MCV 94.8 91.3  PLT 447* 673*   Basic Metabolic Panel: Recent Labs  Lab 02/10/18 0642 02/16/18 1820  NA 138 128*  K 4.3 6.1*  CL 101 88*  CO2 27 18*  GLUCOSE 82 122*  BUN 37* 100*  CREATININE 0.87 5.29*  CALCIUM 8.8* 8.9  MG 2.2  --   PHOS 4.9*  --    GFR: Estimated Creatinine Clearance: 9.8 mL/min (A) (by C-G formula based on SCr of 5.29 mg/dL (H)). Liver Function Tests: Recent Labs  Lab 02/10/18 4193 02/11/18 0714 02/12/18 0628 02/16/18 1820  AST 78* 60* 59* 372*  ALT 171* 158* 151* 501*  ALKPHOS 358* 347* 340* 325*  BILITOT 2.5* 2.4* 2.0* 2.5*  PROT 6.1* 6.3* 6.7 8.4*  ALBUMIN 2.2* 2.2* 2.6* 3.6   No results for input(s): LIPASE, AMYLASE in the last 168 hours. No results for input(s): AMMONIA in the last 168 hours. Coagulation Profile: Recent Labs  Lab 02/16/18 1820  INR 1.49   Cardiac Enzymes: No results for input(s): CKTOTAL, CKMB, CKMBINDEX, TROPONINI in the last 168 hours. BNP (last 3 results) No results for input(s): PROBNP in the last 8760 hours. HbA1C: No results for input(s): HGBA1C in the last 72 hours. CBG: No results for input(s): GLUCAP in the last  168 hours. Lipid Profile: No results for input(s): CHOL, HDL, LDLCALC, TRIG, CHOLHDL, LDLDIRECT in the last 72 hours. Thyroid Function Tests: No results for input(s): TSH, T4TOTAL, FREET4, T3FREE, THYROIDAB in the last 72 hours. Anemia Panel: No results for input(s): VITAMINB12, FOLATE,  FERRITIN, TIBC, IRON, RETICCTPCT in the last 72 hours. Urine analysis:    Component Value Date/Time   COLORURINE AMBER (A) 02/06/2018 1449   APPEARANCEUR CLEAR 02/06/2018 1449   LABSPEC 1.015 02/06/2018 1449   PHURINE 5.0 02/06/2018 1449   GLUCOSEU NEGATIVE 02/06/2018 1449   HGBUR NEGATIVE 02/06/2018 1449   BILIRUBINUR NEGATIVE 02/06/2018 1449   KETONESUR NEGATIVE 02/06/2018 1449   PROTEINUR NEGATIVE 02/06/2018 1449   UROBILINOGEN 0.2 09/19/2012 2302   NITRITE NEGATIVE 02/06/2018 1449   LEUKOCYTESUR NEGATIVE 02/06/2018 1449   Sepsis Labs: @LABRCNTIP (procalcitonin:4,lacticidven:4) ) Recent Results (from the past 240 hour(s))  Culture, blood (routine x 2)     Status: None   Collection Time: 02/07/18  8:12 PM  Result Value Ref Range Status   Specimen Description BLOOD RIGHT HAND  Final   Special Requests   Final    BOTTLES DRAWN AEROBIC AND ANAEROBIC Blood Culture adequate volume   Culture   Final    NO GROWTH 5 DAYS Performed at [facility], [place] [address] [place], [place] [address]    Report Status 02/12/2018 FINAL  Final  Culture, blood (routine x 2)     Status: None   Collection Time: 02/07/18  9:34 PM  Result Value Ref Range Status   Specimen Description BLOOD LEFT HAND  Final   Special Requests   Final    BOTTLES DRAWN AEROBIC ONLY Blood Culture results may not be optimal due to an inadequate volume of blood received in culture bottles   Culture   Final    NO GROWTH 5 DAYS Performed at [facility], [place] [address], [place],  [address]    Report Status 02/12/2018 FINAL  Final     Radiological Exams on Admission: Dg Abdomen Acute W/chest  Result Date: 02/16/2018 CLINICAL DATA:  Nausea, vomiting, diarrhea and abdominal pain. EXAM: DG ABDOMEN ACUTE W/ 1V CHEST COMPARISON:  Portable chest dated 02/08/2018. Chest CTA and abdomen and pelvis CT dated 06/2018. Abdomen MR dated 02/12/2018. FINDINGS: Normal sized heart. Clear lungs with normal vascularity.  Left breast surgical clips. Cervical spine fixation hardware. Normal bowel gas pattern without free peritoneal air. Stable percutaneous biliary drainage catheter and right hip prosthesis. Lumbar and thoracic spine degenerative changes. IMPRESSION: No acute abnormality. Electronically Signed   By: Claudie Revering M.D.   On: 02/16/2018 17:35   Ct Renal Stone Study  Result Date: 02/16/2018 CLINICAL DATA:  Flank pain. Renal insufficiency. Nausea and vomiting. EXAM: CT ABDOMEN AND PELVIS WITHOUT CONTRAST TECHNIQUE: Multidetector CT imaging of the abdomen and pelvis was performed following the standard protocol without IV contrast. COMPARISON:  Chest, abdomen and pelvis radiographs obtained earlier today. Chest CTA and abdomen and pelvis CT dated 02/07/2018. Abdomen and pelvis CT dated 02/01/2018. FINDINGS: Lower chest: Multiple small nodules at both lung bases. The largest is in the right lower lobe, measuring 5 mm on image number 26 series 4. This was not present on the previous examinations and multiple other nodules are also new on both sides. Hepatobiliary: Previously demonstrated percutaneous biliary drainage catheter, poorly defined left lobe liver mass and markedly dilated left lobe bile ducts. Cholecystectomy clips. Pancreas: Unremarkable. No pancreatic ductal dilatation or surrounding inflammatory changes. Spleen: Normal in size  without focal abnormality. Adrenals/Urinary Tract: Normal appearing adrenal glands. Small lower pole right renal cyst. Normal appearing left kidney, ureters and urinary bladder. Portions of the urinary bladder and distal ureters are obscured by streak artifacts from a right hip prosthesis. Stomach/Bowel: Surgically absent appendix. Unremarkable stomach, small bowel and colon. Vascular/Lymphatic: Atheromatous arterial calcifications without aneurysm. No enlarged lymph nodes. Reproductive: Status post hysterectomy. No adnexal masses. Other: No abdominal wall hernia or abnormality. No  abdominopelvic ascites. Musculoskeletal: Right hip prosthesis with associated streak artifacts. Lumbar and lower thoracic spine degenerative changes. IMPRESSION: 1. No acute abnormality. 2. Multiple interval small nodules at both lung bases suspicious for metastases. 3. Previously demonstrated left lobe liver mass causing left lobe liver biliary obstruction. Electronically Signed   By: Claudie Revering M.D.   On: 02/16/2018 21:07    EKG: Independently reviewed.  Normal sinus rhythm with incomplete left bundle branch block with prominent T waves.  Assessment/Plan Principal Problem:   ARF (acute renal failure) (HCC) Active Problems:   Paroxysmal atrial fibrillation (HCC)   Chronic diastolic heart failure (HCC)   COPD mixed type (HCC)   Obstructive jaundice   Atrial fibrillation (HCC)   Hyperkalemia    1. Acute renal failure on chronic kidney disease -appreciate nephrology consult.  Patient is receiving bicarbonate infusion.  For hyperkalemia patient is placed on Lokelma and also is receiving bicarbonate infusion.  Closely follow intake output metabolic panel.  Patient is here to make urine output. 2. Elevated LFTs with a history of recent placement of drain for possible cholangiocarcinoma with liver mass -will follow LFTs reconsult by interventional radiology may discuss with oncologist. 3. History of A. fib presently rate controlled on beta-blocker and due to worsening renal function will hold Xarelto and keep patient on heparin. 4. History of CHF presently receiving fluid for renal failure.  Appears compensated. 5. Anemia likely from renal disease follow CBC. 6. COPD not actively wheezing. 7. Hypertension on amlodipine and beta-blocker.   DVT prophylaxis: Heparin. Code Status: DNR. Family Communication: Discussed with patient. Disposition Plan: Home. Consults called: Nephrology. Admission status: Inpatient.   Rise Patience MD [facility] Pager [phone].  If 7PM-7AM,  please contact night-coverage [email or web address] Password [email or web address]  02/17/2018, 12:06 AM

## 2018-02-18 ENCOUNTER — Inpatient Hospital Stay (HOSPITAL_COMMUNITY): Payer: PPO

## 2018-02-18 LAB — RENAL FUNCTION PANEL
Albumin: 2.9 g/dL — ABNORMAL LOW (ref 3.5–5.0)
Anion gap: 22 — ABNORMAL HIGH (ref 5–15)
BUN: 102 mg/dL — ABNORMAL HIGH (ref 8–23)
CALCIUM: 7.3 mg/dL — AB (ref 8.9–10.3)
CO2: 32 mmol/L (ref 22–32)
Chloride: 76 mmol/L — ABNORMAL LOW (ref 98–111)
Creatinine, Ser: 5.79 mg/dL — ABNORMAL HIGH (ref 0.44–1.00)
GFR calc Af Amer: 7 mL/min — ABNORMAL LOW (ref >60)
GFR calc non Af Amer: 6 mL/min — ABNORMAL LOW (ref >60)
Glucose, Bld: 134 mg/dL — ABNORMAL HIGH (ref 70–99)
Phosphorus: 9.7 mg/dL — ABNORMAL HIGH (ref 2.5–4.6)
Potassium: 3.6 mmol/L (ref 3.5–5.1)
Sodium: 130 mmol/L — ABNORMAL LOW (ref 135–145)

## 2018-02-18 MED ORDER — HEPARIN (PORCINE) 25000 UT/250ML-% IV SOLN: 1250.0000 [IU]/h | INTRAVENOUS | Status: DC

## 2018-02-18 MED ORDER — HEPARIN (PORCINE) 25000 UT/250ML-% IV SOLN
1250.0000 [IU]/h | INTRAVENOUS | Status: DC
  Administered 2018-02-19: 1250 [IU]/h via INTRAVENOUS
  Filled 2018-02-18: qty 250

## 2018-02-18 MED ORDER — SODIUM CHLORIDE 0.9 % IV SOLN
INTRAVENOUS | Status: DC
  Administered 2018-02-18 – 2018-02-20 (×5): via INTRAVENOUS

## 2018-02-18 NOTE — Progress Notes (Signed)
Referring Physician(s): Reece Levy  Supervising Physician: Arne Cleveland  Patient Status:  [facility] - In-pt  Chief Complaint: Abdominal pain, obstructive jaundice/left liver mass   Subjective: Patient doing fair, still weak and has some mild epigastric discomfort, intermittent nausea.  No vomiting.   Allergies: Methocarbamol; Vicodin [hydrocodone-acetaminophen]; Aspirin; Butazolidin [phenylbutazone]; Celebrex [celecoxib]; Codeine; Doripenem; Aspirin buf(alhyd-mghyd-cacar); Mucinex [guaifenesin er]; and Temazepam  Medications: Prior to Admission medications   Medication Sig Start Date End Date Taking? Authorizing Provider  albuterol (PROVENTIL HFA;VENTOLIN HFA) 108 (90 BASE) MCG/ACT inhaler Inhale 1 puff into the lungs every 6 (six) hours as needed for wheezing. Reported on 04/10/2015   Yes [provider]  amLODipine (NORVASC) 10 MG tablet Take 10 mg by mouth daily.   Yes [provider]  budesonide-formoterol (SYMBICORT) 160-4.5 MCG/ACT inhaler Inhale 2 puffs into the lungs See admin instructions. Inhale 1 puff every morning, may inhale a 2nd puff in the evening if needed for wheezing   Yes [provider]  feeding supplement, ENSURE ENLIVE, (ENSURE ENLIVE) LIQD Take 237 mLs by mouth 3 (three) times daily between meals. 02/12/18  Yes Hongalgi, Lenis Dickinson, MD  Multiple Vitamin (MULTIVITAMIN WITH MINERALS) TABS tablet Take 1 tablet by mouth daily.   Yes [provider]  Nebivolol HCl (BYSTOLIC) 20 MG TABS Take 1 tablet (20 mg total) by mouth daily. 10/24/15  Yes Vashaun Osmon, Jeneen Rinks, MD  nitroGLYCERIN (NITROSTAT) 0.4 MG SL tablet Place 0.4 mg under the tongue every 5 (five) minutes as needed for chest pain.   Yes [provider]  omeprazole (PRILOSEC) 20 MG capsule Take 20 mg by mouth daily.   Yes [provider]  ondansetron (ZOFRAN) 4 MG tablet Take 1 tablet (4 mg total) by mouth every 8 (eight) hours as needed for nausea or vomiting. 02/12/18   Yes Hongalgi, Lenis Dickinson, MD  polyethylene glycol (MIRALAX / GLYCOLAX) packet Take 17 g by mouth 2 (two) times daily. 02/12/18  Yes Hongalgi, Lenis Dickinson, MD  rivaroxaban (XARELTO) 20 MG TABS tablet Take 1 tablet (20 mg total) by mouth daily with supper. 10/24/15  Yes Brayton Baumgartner, Jeneen Rinks, MD  senna-docusate (SENOKOT-S) 8.6-50 MG tablet Take 1 tablet by mouth 2 (two) times daily. 02/12/18  Yes Hongalgi, Lenis Dickinson, MD  traMADol (ULTRAM) 50 MG tablet Take 1 tablet (50 mg total) by mouth 2 (two) times daily. 02/12/18  Yes Hongalgi, Lenis Dickinson, MD     Vital Signs: BP (!) 119/57 (BP Location: Right Arm)   Pulse 81   Temp 97.7 F (36.5 C) (Oral)   Resp 18   Ht 5\' 6"  (1.676 m)   Wt 225 lb 5 oz (102.2 kg)   SpO2 98%   BMI 36.37 kg/m   Physical Exam right biliary drain intact, insertion site okay, not significantly tender.  Output 775 cc green bile.  Drain flushed without difficulty.  Puncture site epigastric region from recent liver biopsy soft, clean, dry, mildly tender, no visible bleeding.  Imaging: Ct Biopsy  Result Date: 02/17/2018 INDICATION: 85 year old with history of obstructive jaundice and status post placement of right biliary drain. Patient has persistent dilated left biliary ducts with a poorly defined lesion in the left hepatic lobe. Patient needs a tissue diagnosis. EXAM: IMAGE GUIDED LEFT HEPATIC MASS BIOPSY WITH CT AND ULTRASOUND GUIDANCE MEDICATIONS: Sedation medications one. ANESTHESIA/SEDATION: Moderate (conscious) sedation was employed during this procedure. A total of Versed 1.5 mg and Fentanyl 75 mcg was administered intravenously. Moderate Sedation Time: 28 minutes. The patient's level of consciousness  and vital signs were monitored continuously by radiology nursing throughout the procedure under my direct supervision. FLUOROSCOPY TIME:  None COMPLICATIONS: None immediate. PROCEDURE: Informed written consent was obtained from the patient after a thorough discussion of the procedural risks,  benefits and alternatives. All questions were addressed. Maximal Sterile Barrier Technique was utilized including caps, mask, sterile gowns, sterile gloves, sterile drape, hand hygiene and skin antiseptic. A timeout was performed prior to the initiation of the procedure. Liver was evaluated with ultrasound and CT. Anterior abdomen was prepped with chlorhexidine and sterile field was created. Skin and soft tissues anesthetized with 1% lidocaine. 25 gauge coaxial needle directed into the poorly defined area or lesion in the left hepatic lobe with ultrasound guidance. Needle position was confirmed with CT. Total of 3 core biopsies were obtained with an 18 gauge device. Specimens placed in formalin. 17 gauge needle was removed without complication. Bandage placed over the puncture site. FINDINGS: Poorly defined area in the medial left hepatic lobe is concerning for a mass based on the previous cross-sectional imaging. Dilated left biliary ducts lateral to the poorly defined area. Needle position was confirmed within the lesion. Three core biopsies were obtained. Gas within the tissue and bile ducts along the biopsy tract. No significant bleeding or hematoma formation. IMPRESSION: Image guided core biopsies of left hepatic lobe. Electronically Signed   By: Markus Daft M.D.   On: 02/17/2018 17:10   Dg Abdomen Acute W/chest  Result Date: 02/16/2018 CLINICAL DATA:  Nausea, vomiting, diarrhea and abdominal pain. EXAM: DG ABDOMEN ACUTE W/ 1V CHEST COMPARISON:  Portable chest dated 02/08/2018. Chest CTA and abdomen and pelvis CT dated 06/2018. Abdomen MR dated 02/12/2018. FINDINGS: Normal sized heart. Clear lungs with normal vascularity. Left breast surgical clips. Cervical spine fixation hardware. Normal bowel gas pattern without free peritoneal air. Stable percutaneous biliary drainage catheter and right hip prosthesis. Lumbar and thoracic spine degenerative changes. IMPRESSION: No acute abnormality. Electronically Signed    By: Claudie Revering M.D.   On: 02/16/2018 17:35   Ir Abdomen US Limited  Result Date: 02/17/2018 INDICATION: 85 year old with history of obstructive jaundice and status post placement of right biliary drain. Patient has persistent dilated left biliary ducts with a poorly defined lesion in the left hepatic lobe. Patient needs a tissue diagnosis. EXAM: IMAGE GUIDED LEFT HEPATIC MASS BIOPSY WITH CT AND ULTRASOUND GUIDANCE MEDICATIONS: Sedation medications one. ANESTHESIA/SEDATION: Moderate (conscious) sedation was employed during this procedure. A total of Versed 1.5 mg and Fentanyl 75 mcg was administered intravenously. Moderate Sedation Time: 28 minutes. The patient's level of consciousness and vital signs were monitored continuously by radiology nursing throughout the procedure under my direct supervision. FLUOROSCOPY TIME:  None COMPLICATIONS: None immediate. PROCEDURE: Informed written consent was obtained from the patient after a thorough discussion of the procedural risks, benefits and alternatives. All questions were addressed. Maximal Sterile Barrier Technique was utilized including caps, mask, sterile gowns, sterile gloves, sterile drape, hand hygiene and skin antiseptic. A timeout was performed prior to the initiation of the procedure. Liver was evaluated with ultrasound and CT. Anterior abdomen was prepped with chlorhexidine and sterile field was created. Skin and soft tissues anesthetized with 1% lidocaine. 72 gauge coaxial needle directed into the poorly defined area or lesion in the left hepatic lobe with ultrasound guidance. Needle position was confirmed with CT. Total of 3 core biopsies were obtained with an 18 gauge device. Specimens placed in formalin. 17 gauge needle was removed without complication. Bandage placed over the puncture  site. FINDINGS: Poorly defined area in the medial left hepatic lobe is concerning for a mass based on the previous cross-sectional imaging. Dilated left biliary ducts  lateral to the poorly defined area. Needle position was confirmed within the lesion. Three core biopsies were obtained. Gas within the tissue and bile ducts along the biopsy tract. No significant bleeding or hematoma formation. IMPRESSION: Image guided core biopsies of left hepatic lobe. Electronically Signed   By: Markus Daft M.D.   On: 02/17/2018 17:10   Ct Renal Stone Study  Result Date: 02/16/2018 CLINICAL DATA:  Flank pain. Renal insufficiency. Nausea and vomiting. EXAM: CT ABDOMEN AND PELVIS WITHOUT CONTRAST TECHNIQUE: Multidetector CT imaging of the abdomen and pelvis was performed following the standard protocol without IV contrast. COMPARISON:  Chest, abdomen and pelvis radiographs obtained earlier today. Chest CTA and abdomen and pelvis CT dated 02/07/2018. Abdomen and pelvis CT dated 02/01/2018. FINDINGS: Lower chest: Multiple small nodules at both lung bases. The largest is in the right lower lobe, measuring 5 mm on image number 26 series 4. This was not present on the previous examinations and multiple other nodules are also new on both sides. Hepatobiliary: Previously demonstrated percutaneous biliary drainage catheter, poorly defined left lobe liver mass and markedly dilated left lobe bile ducts. Cholecystectomy clips. Pancreas: Unremarkable. No pancreatic ductal dilatation or surrounding inflammatory changes. Spleen: Normal in size without focal abnormality. Adrenals/Urinary Tract: Normal appearing adrenal glands. Small lower pole right renal cyst. Normal appearing left kidney, ureters and urinary bladder. Portions of the urinary bladder and distal ureters are obscured by streak artifacts from a right hip prosthesis. Stomach/Bowel: Surgically absent appendix. Unremarkable stomach, small bowel and colon. Vascular/Lymphatic: Atheromatous arterial calcifications without aneurysm. No enlarged lymph nodes. Reproductive: Status post hysterectomy. No adnexal masses. Other: No abdominal wall hernia or  abnormality. No abdominopelvic ascites. Musculoskeletal: Right hip prosthesis with associated streak artifacts. Lumbar and lower thoracic spine degenerative changes. IMPRESSION: 1. No acute abnormality. 2. Multiple interval small nodules at both lung bases suspicious for metastases. 3. Previously demonstrated left lobe liver mass causing left lobe liver biliary obstruction. Electronically Signed   By: Claudie Revering M.D.   On: 02/16/2018 21:07    Labs:  CBC: Recent Labs    02/09/18 1136 02/10/18 0642 02/16/18 1820 02/17/18 0531  WBC 17.0* 12.1* 28.3* 23.3*  HGB 10.5* 10.3* 13.9 13.0  HCT 34.0* 34.3* 44.3 41.2  PLT 500* 447* 570* 478*    COAGS: Recent Labs    02/01/18 1712 02/03/18 0512 02/08/18 0531 02/08/18 1140 02/09/18 0512 02/09/18 2236 02/16/18 1820  INR 1.16  --   --   --  1.10  --  1.49  APTT  --  60* 84* 91*  --  35  --     BMP: Recent Labs    02/10/18 0642 02/16/18 1820 02/17/18 0531 02/18/18 0444  NA 138 128* 130* 130*  K 4.3 6.1* 5.2* 3.6  CL 101 88* 86* 76*  CO2 27 18* 18* 32  GLUCOSE 82 122* 110* 134*  BUN 37* 100* 105* 102*  CALCIUM 8.8* 8.9 8.4* 7.3*  CREATININE 0.87 5.29* 5.67* 5.79*  GFRNONAA >60 7* 6* 6*  GFRAA >60 8* 7* 7*    LIVER FUNCTION TESTS: Recent Labs    02/11/18 0714 02/12/18 0628 02/16/18 1820 02/17/18 0531 02/18/18 0444  BILITOT 2.4* 2.0* 2.5* 2.6*  --   AST 60* 59* 372* 290*  --   ALT 158* 151* 501* 505*  --   ALKPHOS 347*  340* 325* 281*  --   PROT 6.3* 6.7 8.4* 7.6  --   ALBUMIN 2.2* 2.6* 3.6 3.6 2.9*    Assessment and Plan: Patient with history of obstructive jaundice/persistently dilated left biliary ducts and prior right biliary drain placement on 02/02/2018 with exchange and upsizing on 02/09/2018 along with negative common bile duct brush biopsy.  Status post left hepatic lobe lesion biopsy on 1/15, path pending.  Afebrile, creatinine 5.79 up slightly from 5.67; no new LFTs today.  Case discussed with Drs. Henn and  [place].  Plan today is for capping of biliary drain and close monitoring of LFTs/renal fxn/CBC.  No additional biliary intervention planned at this time.  Await pathology findings.  If patient becomes febrile, has increasing abdominal pain or leaking at drain site nurse instructed to place back to gravity drainage.   Electronically Signed: D. Rowe Robert, PA-C 02/18/2018, 4:06 PM   I spent a total of 15 minutes at the the patient's bedside AND on the patient's hospital floor or unit, greater than 50% of which was counseling/coordinating care for biliary drain    Patient ID: Jordan Byrd, female   DOB: April 24, 1933, 85 y.o.   MRN: [id number]

## 2018-02-18 NOTE — Plan of Care (Signed)
Pt nauseated during shift and given Zofran.

## 2018-02-18 NOTE — Progress Notes (Signed)
[facility] PROGRESS NOTE  Jordan Byrd ZOX:[id number] DOB: [birth year] DOA: 02/16/2018 PCP: Levin Erp, MD   Narrative: 85 year old female Known history of chronic A. fib chads score >3 on Xarelto HTN Prior stage II breast cancer status post lumpectomy 2015 HFprEF EF 55-60% 07/13/2012 Stage III CKD creatinine last admission 1.09 Morbid obesity BMI 39 COPD on oxygen 2 L baseline  Recent admit 12/30-1/10 for obstructive painless jaundice and liver mass with concern for cholangiocarcinoma-at that admission she was found to have left lower lobe lesion IR evaluated after GI saw had 10 French internal/external drain to right liver lobe-CA-[id number] Ca1 2567 CEA 4-she had a nonfunctioning drain on 1/5 associated with severe pain and had replacement of drain 1/7 no further findings-it was felt despite negative brushings that likely cholangiocarcinoma and less likely hepatocellular--- Dr. Alen Blew consulted + felt more palliative XRT chemo  She returns with adult failure to thrive nausea vomiting and a creatinine of 5 in the setting of these issues she has not been eating and drinking her discharge creatinine was 37/0.8 In addition she had another CT showing ill-defined hepatic lesion and significant dilation of the left hepatic ductal system and hence IR was consulted    A & Plan  Nausea vomiting malaise Now subjectively slightly improved-allow diet and monitor Acute kidney injury with hyperkalemia on admission Likely precipitated by BP meds ARB etc. nephrology has been consulted no emergent indication for dialysis at this time-potassium is improved Hypoosmolar hyponatremia Urine osmolality was 353 sodium 11-continue IV saline at this time 9 Continue saline 125 cc/H Atrial fibrillation chads score >3 Continue Bystolic 10 daily, holding Xarelto as may need further procedures will start Lovenox therapeutic dosing Probable cholangiocarcinoma with right hepatic drain Had drain replaced  1/7 and 1/12 [place] internal/external biliary drain -it has consistently been putting output 6-700 cc I have asked IR today to see if they can cap the drain as it is a both internal and external drain and hopefully this can decrease the output from the drain and allow her to reach euvolemia HFprEF Stable at this time if anything is volume depleted HTN Amlodipine 10, nebivolol 20 losartan DC completely Right finger dislocation I have asked orthopedics to look into her dislocation on the second interphalangeal joint-obtaining x-rays today Pulmonary nodules Warrants outpatient monitoring PET scan to be scheduled   DVT therapeutic heparin code Status: DNR communication: No family today I spoke with 1 of the daughters yesterday disposition Plan: Inpatient pending resolution   [name], MD  [facility] Via [employer] app OR [email or web address] 7PM-7AM contact night coverage as above 02/18/2018, 2:42 PM  LOS: 2 days   Consultants:  IR  Renal  Procedures:    Antimicrobials:  None  Interval history/Subjective: Not hungry not thirsty not really eating or drinking no chest pain no fever no further vomiting  Objective:  Vitals:  Vitals:   02/18/18 1016 02/18/18 1218  BP:  (!) 119/57  Pulse:  81  Resp:  18  Temp:  97.7 F (36.5 C)  SpO2: 95% 98%    Exam:  EOMI NCAT icteric Chest clear no added sound No lower extremity edema no abdominal pain no rebound no guarding no organomegaly drain in place in right quadrant Neurologically intact no confusion  I have personally reviewed the following:  DATA   Labs:  Sodium 130 chloride 76 BUN 102/5.7 up from 1055.6 calcium 7.3   Scheduled Meds: . enoxaparin (LOVENOX) injection  1 mg/kg Subcutaneous Daily  . mouth rinse  15  mL Mouth Rinse BID  . mometasone-formoterol  2 puff Inhalation BID  . nebivolol  10 mg Oral Daily  . pantoprazole  40 mg Oral Daily  . polyethylene glycol  17 g Oral BID  . senna-docusate  1 tablet Oral  BID  . traMADol  50 mg Oral BID   Continuous Infusions: . sodium chloride 125 mL/hr at 02/18/18 1403  . sodium chloride      Principal Problem:   ARF (acute renal failure) (HCC) Active Problems:   Paroxysmal atrial fibrillation (HCC)   Chronic diastolic heart failure (HCC)   COPD mixed type (HCC)   Obstructive jaundice   Atrial fibrillation (HCC)   Hyperkalemia   LOS: 2 days

## 2018-02-18 NOTE — Progress Notes (Addendum)
[place] for heparin Indication: atrial fibrillation  Allergies  Allergen Reactions  . Methocarbamol Hives  . Vicodin [Hydrocodone-Acetaminophen] Other (See Comments)    Interacts with bp medication and drops blood pressure  . Aspirin Nausea And Vomiting  . Butazolidin [Phenylbutazone] Other (See Comments)    Unknown reaction  . Celebrex [Celecoxib] Nausea And Vomiting  . Codeine Nausea And Vomiting  . Doripenem Rash  . Aspirin Buf(Alhyd-Mghyd-Cacar) Nausea And Vomiting  . Mucinex [Guaifenesin Er] Itching  . Temazepam Other (See Comments)    Unknown reaction    Patient Measurements: Height: 5\' 6"  (167.6 cm) Weight: 225 lb 5 oz (102.2 kg) IBW/kg (Calculated) : 59.3 Heparin Dosing Weight: 82.5 kg  Vital Signs: Temp: 97.7 F (36.5 C) (01/16 1218) Temp Source: Oral (01/16 1218) BP: 119/57 (01/16 1218) Pulse Rate: 81 (01/16 1218)  Labs: Recent Labs    02/16/18 1820 02/17/18 0531 02/18/18 0444  HGB 13.9 13.0  --   HCT 44.3 41.2  --   PLT 570* 478*  --   LABPROT 17.9*  --   --   INR 1.49  --   --   CREATININE 5.29* 5.67* 5.79*    Estimated Creatinine Clearance: 9 mL/min (A) (by C-G formula based on SCr of 5.79 mg/dL (H)).   Medical History: Past Medical History:  Diagnosis Date  . Anemia   . Arthritis    "all over" (02/01/2018)  . Asthma   . Atrial fibrillation ([place])    on Xarelto  . Breast cancer, left breast ([place]) 05/19/13   left breast stage IIA   . CHF (congestive heart failure) ([place])   . COPD (chronic obstructive pulmonary disease) ([place])   . GERD (gastroesophageal reflux disease)    [name] Prilosec  . Hypertension   . Macular degeneration   . Obstructive jaundice 02/01/2018  . On home oxygen therapy    "2.5L prn" (02/01/2018)  . Osteoarthritis    a. 03/28/11 - R Total Hip Arthroplasty  . Pleurisy   . Pneumonia    "2-3 times" (02/01/2018)    Medications:  Xarelto 20 mg daily PTA > Lovenox 1 mg/kg sq daily  > heparin infusion (last dose of Xarelto 1/13)  Assessment: 85 y/o F with a h/o chronic atrial fibrillation on Xarelto PTA and recent finding of hepatic mass s/o IR drain admitted with ARF due to high-output bili drain. Patient was placed on Lovenox bridge therapy due to AKI. Plan to transition to UFH as a safer option in the setting of ARF with SCR 5.79. Patient received a dose of Lovenox 1 mg/kg 1/16 @ 1030.    Goal of Therapy:  APTT 66-102 s Heparin level 0.3-0.7 units/ml Monitor platelets by anticoagulation protocol: Yes   Plan:  - Will initiate heparin infusion at 1250 units/hr without bolus at 0900 1/17 (RN instructed re start time) - Baseline aPTT/HL with AM labs - Will check a HL or aPTT 8 hours after initiation of infusion depending on whether or not anti-Xa level is affected by recent Xarelto and Lovenox doses - Daily CBC - monitor for signs/symptoms of bleeding   Ulice Dash D 02/18/2018,3:20 PM

## 2018-02-18 NOTE — Progress Notes (Signed)
Initial Nutrition Assessment  DOCUMENTATION CODES:   Morbid obesity  INTERVENTION:    Magic cup TID with meals, each supplement provides 290 kcal and 9 grams of protein  Provide MVI daily  NUTRITION DIAGNOSIS:   Inadequate oral intake related to nausea, poor appetite as evidenced by per patient/family report  GOAL:   Patient will meet greater than or equal to 90% of their needs  MONITOR:   PO intake, Supplement acceptance, Weight trends, Labs, Diet advancement  REASON FOR ASSESSMENT:   Malnutrition Screening Tool    ASSESSMENT:   Patient with PMH significant for CHF, atrial fibrillation, HTN, COPD, and anemia.  Recently admitted for obstructive jaundice and had drain placed by IR and discharged to rehab last week. Presents this admission with ARF on CKD and elevated LFTs.    Pt endorses her PO intake started to decrease right before thanksgiving. She would try to eat food options like yogurt, pudding, and ice cream but could only tolerate bites each day. Her nausea continues to be an issue this admission. She skipped breakfast this am due to this. Zofran has been given and pt reports he helped some. Discussed supplement options with pt. She does not wish to have Ensure as it's too sweet. Will try Magic Cups. Pt is at risk for malnutrition given lack of PO intake.   Pt endorses a UBW of 242 lb and a recent wt loss of 20 lb. Records indicate pt weighed 239 lb during her last admission and 222 lb this admission. Unable to determine how much is dry wt loss versus fluid loss given history of CHF. Nutrition-Focused physical exam completed.   Medications reviewed and include: miralax, senokot, sodium bicarb Labs reviewed: Na 130 (L) Phosphorus 9.7 (H)   NUTRITION - FOCUSED PHYSICAL EXAM:    Most Recent Value  Orbital Region  No depletion  Upper Arm Region  No depletion  Thoracic and Lumbar Region  Unable to assess  Buccal Region  No depletion  Temple Region  Mild depletion   Clavicle Bone Region  No depletion  Clavicle and Acromion Bone Region  Mild depletion  Scapular Bone Region  Unable to assess  Dorsal Hand  Mild depletion  Patellar Region  No depletion  Anterior Thigh Region  No depletion  Posterior Calf Region  No depletion  Edema (RD Assessment)  Mild     Diet Order:   Diet Order            Diet NPO time specified  Diet effective now              EDUCATION NEEDS:   Education needs have been addressed  Skin:  Skin Assessment: Skin Integrity Issues: Skin Integrity Issues:: Incisions Incisions: closed right abdomen  Last BM:  1/16  Height:   Ht Readings from Last 1 Encounters:  02/16/18 5\' 6"  (1.676 m)    Weight:   Wt Readings from Last 1 Encounters:  02/18/18 102.2 kg    Ideal Body Weight:  59.1 kg  BMI:  Body mass index is 36.37 kg/m.  Estimated Nutritional Needs:   Kcal:  2000-2200 kcal  Protein:  110-120 grams  Fluid:  >/= 2 L/day  Mariana Single RD, LDN Clinical Nutrition Pager # - [phone]

## 2018-02-18 NOTE — Progress Notes (Signed)
Subjective:  s/p biopsy of this hepatic mass.  She is starving to death- having trouble getting a diet order from MD- but otherwise OK-   potassium much improved and sodium stable - 325 urine yest- 275 so far today - crt not really improving unfortunately but not worsening much either    Objective Vital signs in last 24 hours: Vitals:   02/18/18 0812 02/18/18 1015 02/18/18 1016 02/18/18 1218  BP:    (!) 119/57  Pulse:    81  Resp:    18  Temp:    97.7 F (36.5 C)  TempSrc:    Oral  SpO2: 96% (!) 88% 95% 98%  Weight:      Height:       Weight change: 1.2 kg  Intake/Output Summary (Last 24 hours) at 02/18/2018 1318 Last data filed at 02/18/2018 1044 Gross per 24 hour  Intake 3101.59 ml  Output 1625 ml  Net 1476.59 ml    Assessment/ Plan: Pt is a 85 y.o. yo female who was admitted on 02/16/2018 with AKI in the setting of volume depletion due to high output chole drain   Assessment/Plan: 1. Renal - AKI- crt 0.87 on 1/8- now over 5.  U/A with moderate blood- RBC and WBC- really no protein- high spec gravity.  Seeming like ATN from volume depletion- imaging without hydro.  Continue with IV fluid repletion- now that bicarb is normalized will change to only NS.  No indications for HD and hopefully will not need  2. Hyponatremia- seeming like hypovolemic hyponatremia- giving NS  - slow to improve 3. Anemia-not an issue- likely hemoconcentrated  4. HTN/volume- still seems volume depleted- cont IVF now just NS.  Will stop amlodipine and decrease bystolic as I do not want BP to get too low.  Might need strategy if there is one to decrease the drainage from the chole tube ?   5. Hyperkalemia- resolved, stop lokelma  [name]    Labs: Basic Metabolic Panel: Recent Labs  Lab 02/16/18 1820 02/17/18 0531 02/18/18 0444  NA 128* 130* 130*  K 6.1* 5.2* 3.6  CL 88* 86* 76*  CO2 18* 18* 32  GLUCOSE 122* 110* 134*  BUN 100* 105* 102*  CREATININE 5.29* 5.67* 5.79*  CALCIUM 8.9  8.4* 7.3*  PHOS  --   --  9.7*   Liver Function Tests: Recent Labs  Lab 02/12/18 0628 02/16/18 1820 02/17/18 0531 02/18/18 0444  AST 59* 372* 290*  --   ALT 151* 501* 505*  --   ALKPHOS 340* 325* 281*  --   BILITOT 2.0* 2.5* 2.6*  --   PROT 6.7 8.4* 7.6  --   ALBUMIN 2.6* 3.6 3.6 2.9*   No results for input(s): LIPASE, AMYLASE in the last 168 hours. No results for input(s): AMMONIA in the last 168 hours. CBC: Recent Labs  Lab 02/16/18 1820 02/17/18 0531  WBC 28.3* 23.3*  NEUTROABS 26.0*  --   HGB 13.9 13.0  HCT 44.3 41.2  MCV 91.3 90.9  PLT 570* 478*   Cardiac Enzymes: No results for input(s): CKTOTAL, CKMB, CKMBINDEX, TROPONINI in the last 168 hours. CBG: No results for input(s): GLUCAP in the last 168 hours.  Iron Studies: No results for input(s): IRON, TIBC, TRANSFERRIN, FERRITIN in the last 72 hours. Studies/Results: Ct Biopsy  Result Date: 02/17/2018 INDICATION: 85 year old with history of obstructive jaundice and status post placement of right biliary drain. Patient has persistent dilated left biliary ducts with a poorly defined  lesion in the left hepatic lobe. Patient needs a tissue diagnosis. EXAM: IMAGE GUIDED LEFT HEPATIC MASS BIOPSY WITH CT AND ULTRASOUND GUIDANCE MEDICATIONS: Sedation medications one. ANESTHESIA/SEDATION: Moderate (conscious) sedation was employed during this procedure. A total of Versed 1.5 mg and Fentanyl 75 mcg was administered intravenously. Moderate Sedation Time: 28 minutes. The patient's level of consciousness and vital signs were monitored continuously by radiology nursing throughout the procedure under my direct supervision. FLUOROSCOPY TIME:  None COMPLICATIONS: None immediate. PROCEDURE: Informed written consent was obtained from the patient after a thorough discussion of the procedural risks, benefits and alternatives. All questions were addressed. Maximal Sterile Barrier Technique was utilized including caps, mask, sterile gowns,  sterile gloves, sterile drape, hand hygiene and skin antiseptic. A timeout was performed prior to the initiation of the procedure. Liver was evaluated with ultrasound and CT. Anterior abdomen was prepped with chlorhexidine and sterile field was created. Skin and soft tissues anesthetized with 1% lidocaine. 70 gauge coaxial needle directed into the poorly defined area or lesion in the left hepatic lobe with ultrasound guidance. Needle position was confirmed with CT. Total of 3 core biopsies were obtained with an 18 gauge device. Specimens placed in formalin. 17 gauge needle was removed without complication. Bandage placed over the puncture site. FINDINGS: Poorly defined area in the medial left hepatic lobe is concerning for a mass based on the previous cross-sectional imaging. Dilated left biliary ducts lateral to the poorly defined area. Needle position was confirmed within the lesion. Three core biopsies were obtained. Gas within the tissue and bile ducts along the biopsy tract. No significant bleeding or hematoma formation. IMPRESSION: Image guided core biopsies of left hepatic lobe. Electronically Signed   By: Markus Daft M.D.   On: 02/17/2018 17:10   Dg Abdomen Acute W/chest  Result Date: 02/16/2018 CLINICAL DATA:  Nausea, vomiting, diarrhea and abdominal pain. EXAM: DG ABDOMEN ACUTE W/ 1V CHEST COMPARISON:  Portable chest dated 02/08/2018. Chest CTA and abdomen and pelvis CT dated 06/2018. Abdomen MR dated 02/12/2018. FINDINGS: Normal sized heart. Clear lungs with normal vascularity. Left breast surgical clips. Cervical spine fixation hardware. Normal bowel gas pattern without free peritoneal air. Stable percutaneous biliary drainage catheter and right hip prosthesis. Lumbar and thoracic spine degenerative changes. IMPRESSION: No acute abnormality. Electronically Signed   By: Claudie Revering M.D.   On: 02/16/2018 17:35   Ir Abdomen US Limited  Result Date: 02/17/2018 INDICATION: 85 year old with history of  obstructive jaundice and status post placement of right biliary drain. Patient has persistent dilated left biliary ducts with a poorly defined lesion in the left hepatic lobe. Patient needs a tissue diagnosis. EXAM: IMAGE GUIDED LEFT HEPATIC MASS BIOPSY WITH CT AND ULTRASOUND GUIDANCE MEDICATIONS: Sedation medications one. ANESTHESIA/SEDATION: Moderate (conscious) sedation was employed during this procedure. A total of Versed 1.5 mg and Fentanyl 75 mcg was administered intravenously. Moderate Sedation Time: 28 minutes. The patient's level of consciousness and vital signs were monitored continuously by radiology nursing throughout the procedure under my direct supervision. FLUOROSCOPY TIME:  None COMPLICATIONS: None immediate. PROCEDURE: Informed written consent was obtained from the patient after a thorough discussion of the procedural risks, benefits and alternatives. All questions were addressed. Maximal Sterile Barrier Technique was utilized including caps, mask, sterile gowns, sterile gloves, sterile drape, hand hygiene and skin antiseptic. A timeout was performed prior to the initiation of the procedure. Liver was evaluated with ultrasound and CT. Anterior abdomen was prepped with chlorhexidine and sterile field was created. Skin and soft  tissues anesthetized with 1% lidocaine. 95 gauge coaxial needle directed into the poorly defined area or lesion in the left hepatic lobe with ultrasound guidance. Needle position was confirmed with CT. Total of 3 core biopsies were obtained with an 18 gauge device. Specimens placed in formalin. 17 gauge needle was removed without complication. Bandage placed over the puncture site. FINDINGS: Poorly defined area in the medial left hepatic lobe is concerning for a mass based on the previous cross-sectional imaging. Dilated left biliary ducts lateral to the poorly defined area. Needle position was confirmed within the lesion. Three core biopsies were obtained. Gas within the  tissue and bile ducts along the biopsy tract. No significant bleeding or hematoma formation. IMPRESSION: Image guided core biopsies of left hepatic lobe. Electronically Signed   By: Markus Daft M.D.   On: 02/17/2018 17:10   Ct Renal Stone Study  Result Date: 02/16/2018 CLINICAL DATA:  Flank pain. Renal insufficiency. Nausea and vomiting. EXAM: CT ABDOMEN AND PELVIS WITHOUT CONTRAST TECHNIQUE: Multidetector CT imaging of the abdomen and pelvis was performed following the standard protocol without IV contrast. COMPARISON:  Chest, abdomen and pelvis radiographs obtained earlier today. Chest CTA and abdomen and pelvis CT dated 02/07/2018. Abdomen and pelvis CT dated 02/01/2018. FINDINGS: Lower chest: Multiple small nodules at both lung bases. The largest is in the right lower lobe, measuring 5 mm on image number 26 series 4. This was not present on the previous examinations and multiple other nodules are also new on both sides. Hepatobiliary: Previously demonstrated percutaneous biliary drainage catheter, poorly defined left lobe liver mass and markedly dilated left lobe bile ducts. Cholecystectomy clips. Pancreas: Unremarkable. No pancreatic ductal dilatation or surrounding inflammatory changes. Spleen: Normal in size without focal abnormality. Adrenals/Urinary Tract: Normal appearing adrenal glands. Small lower pole right renal cyst. Normal appearing left kidney, ureters and urinary bladder. Portions of the urinary bladder and distal ureters are obscured by streak artifacts from a right hip prosthesis. Stomach/Bowel: Surgically absent appendix. Unremarkable stomach, small bowel and colon. Vascular/Lymphatic: Atheromatous arterial calcifications without aneurysm. No enlarged lymph nodes. Reproductive: Status post hysterectomy. No adnexal masses. Other: No abdominal wall hernia or abnormality. No abdominopelvic ascites. Musculoskeletal: Right hip prosthesis with associated streak artifacts. Lumbar and lower thoracic  spine degenerative changes. IMPRESSION: 1. No acute abnormality. 2. Multiple interval small nodules at both lung bases suspicious for metastases. 3. Previously demonstrated left lobe liver mass causing left lobe liver biliary obstruction. Electronically Signed   By: Claudie Revering M.D.   On: 02/16/2018 21:07   Medications: Infusions: .  sodium bicarbonate  infusion 1000 mL 125 mL/hr at 02/18/18 1044  . sodium chloride      Scheduled Medications: . enoxaparin (LOVENOX) injection  1 mg/kg Subcutaneous Daily  . mouth rinse  15 mL Mouth Rinse BID  . mometasone-formoterol  2 puff Inhalation BID  . nebivolol  10 mg Oral Daily  . pantoprazole  40 mg Oral Daily  . polyethylene glycol  17 g Oral BID  . senna-docusate  1 tablet Oral BID  . sodium zirconium cyclosilicate  10 g Oral BID  . traMADol  50 mg Oral BID    have reviewed scheduled and prn medications.  Physical Exam: General: NAD Heart: RRR Lungs: mostly clear Abdomen: distended- drain in place Extremities: no peripheral edema    02/18/2018,1:18 PM  LOS: 2 days

## 2018-02-19 LAB — COMPREHENSIVE METABOLIC PANEL
ALT: 239 U/L — ABNORMAL HIGH (ref 0–44)
AST: 56 U/L — AB (ref 15–41)
Albumin: 2.7 g/dL — ABNORMAL LOW (ref 3.5–5.0)
Alkaline Phosphatase: 182 U/L — ABNORMAL HIGH (ref 38–126)
Anion gap: 20 — ABNORMAL HIGH (ref 5–15)
BUN: 93 mg/dL — ABNORMAL HIGH (ref 8–23)
CO2: 33 mmol/L — ABNORMAL HIGH (ref 22–32)
Calcium: 6.7 mg/dL — ABNORMAL LOW (ref 8.9–10.3)
Chloride: 81 mmol/L — ABNORMAL LOW (ref 98–111)
Creatinine, Ser: 5.19 mg/dL — ABNORMAL HIGH (ref 0.44–1.00)
GFR calc Af Amer: 8 mL/min — ABNORMAL LOW (ref >60)
GFR, EST NON AFRICAN AMERICAN: 7 mL/min — AB (ref >60)
Glucose, Bld: 105 mg/dL — ABNORMAL HIGH (ref 70–99)
Potassium: 3.1 mmol/L — ABNORMAL LOW (ref 3.5–5.1)
Sodium: 134 mmol/L — ABNORMAL LOW (ref 135–145)
Total Bilirubin: 1.8 mg/dL — ABNORMAL HIGH (ref 0.3–1.2)
Total Protein: 5.9 g/dL — ABNORMAL LOW (ref 6.5–8.1)

## 2018-02-19 LAB — CBC
HCT: 31.8 % — ABNORMAL LOW (ref 36.0–46.0)
Hemoglobin: 10 g/dL — ABNORMAL LOW (ref 12.0–15.0)
MCH: 28.6 pg (ref 26.0–34.0)
MCHC: 31.4 g/dL (ref 30.0–36.0)
MCV: 90.9 fL (ref 80.0–100.0)
Platelets: 320 10*3/uL (ref 150–400)
RBC: 3.5 MIL/uL — ABNORMAL LOW (ref 3.87–5.11)
RDW: 17 % — AB (ref 11.5–15.5)
WBC: 11.8 10*3/uL — ABNORMAL HIGH (ref 4.0–10.5)
nRBC: 0 % (ref 0.0–0.2)

## 2018-02-19 LAB — APTT: aPTT: 52 seconds — ABNORMAL HIGH (ref 24–36)

## 2018-02-19 LAB — HEPARIN LEVEL (UNFRACTIONATED): Heparin Unfractionated: 0.39 IU/mL (ref 0.30–0.70)

## 2018-02-19 LAB — MAGNESIUM: Magnesium: 2.4 mg/dL (ref 1.7–2.4)

## 2018-02-19 MED ORDER — SENNOSIDES-DOCUSATE SODIUM 8.6-50 MG PO TABS: 1.0000 | ORAL_TABLET | Freq: Every evening | ORAL | Status: DC | PRN

## 2018-02-19 MED ORDER — RIVAROXABAN 15 MG PO TABS
15.0000 mg | ORAL_TABLET | Freq: Every day | ORAL | Status: DC
  Administered 2018-02-19 – 2018-02-23 (×5): 15 mg via ORAL
  Filled 2018-02-19 (×5): qty 1

## 2018-02-19 MED ORDER — DIPHENHYDRAMINE HCL 25 MG PO CAPS
25.0000 mg | ORAL_CAPSULE | Freq: Three times a day (TID) | ORAL | Status: DC | PRN
  Administered 2018-02-19: 25 mg via ORAL
  Filled 2018-02-19: qty 1

## 2018-02-19 MED ORDER — POLYETHYLENE GLYCOL 3350 17 G PO PACK: 17.0000 g | PACK | Freq: Every day | ORAL | Status: DC | PRN

## 2018-02-19 MED ORDER — POTASSIUM CHLORIDE CRYS ER 20 MEQ PO TBCR
40.0000 meq | EXTENDED_RELEASE_TABLET | Freq: Every day | ORAL | Status: DC
  Administered 2018-02-19 – 2018-02-23 (×5): 40 meq via ORAL
  Filled 2018-02-19 (×5): qty 2

## 2018-02-19 NOTE — Progress Notes (Signed)
[facility] PRO[facility]ESS NOTE  Jordan Byrd TWK:[id number] DOB: [birth year] DOA: 02/16/2018 PCP: Levin Erp, MD   Narrative:  85 year old female Known history of chronic A. fib chads score >3 on Xarelto HTN Prior stage II breast cancer status post lumpectomy 2015 HFprEF EF 55-60% 07/13/2012 Stage III CKD creatinine last admission 1.09 Morbid obesity BMI 39 COPD on oxygen 2 L baseline  Recent admit 12/30-1/10 for obstructive painless jaundice and liver mass with concern for cholangiocarcinoma-at that admission she was found to have left lower lobe lesion IR evaluated after GI saw had 10 French internal/external drain to right liver lobe-CA-[id number] Ca1 2567 CEA 4-she had a nonfunctioning drain on 1/5 associated with severe pain and had replacement of drain 1/7 no further findings-it was felt despite negative brushings that likely cholangiocarcinoma and less likely hepatocellular--- Dr. Alen Blew consulted + felt more palliative XRT chemo  She returns with adult failure to thrive nausea vomiting and a creatinine of 5 in the setting of these issues she has not been eating and drinking her discharge creatinine was 37/0.8 In addition she had another CT showing ill-defined hepatic lesion and significant dilation of the left hepatic ductal system and hence IR was consulted    A & Plan  Nausea vomiting malaise Now subjectively slightly improved-tolerating diet Acute kidney injury with hyperkalemia on admission Likely precipitated by BP meds ARB etc. nephrology has been consulted no emergent indication for dialysis at this time-continue NS Hypoosmolar hyponatremia Urine osmolality was 353 sodium 11-continue IV saline at this time  Sodium is improved Saline rate now 75 down from 125 Atrial fibrillation chads score >3 Continue Bystolic 10 daily, zooming Xarelto discontinue heparin as no new procedures planned Cholangiocarcinoma with right hepatic drain Had drain replaced 1/7 and 12 French  internal/external biliary drain -it has consistently been putting output 6-700 cc and was internalized 1/16 I d/w Dr. Anselm Pancoast of IR who feels there may be a surgical option to remove the mass--Will need to contact Dr. Barry Dienes of Surgery when she is available for a hepatic surgery opinion HFprEF Stable at this time-holding diuretics/ARB HTN Amlodipine d/w 1/17, nebivolol 20-->10 1/17 per neprho losartan DC completely Right finger dislocation X-rays today of the hand do not reveal acute fracture--will remove splint Pulmonary nodules Warrants outpatient monitoring PET scan to be scheduled   DVT therapeutic heparin code Status: DNR communication: discussed with grandson on phone disposition Plan: Inpatient pending improvement of creatinine and nausea vomiting-if she is still here on Monday I would ask an opinion of hepatic surgeon otherwise she will be able to follow-up as an outpatient and have this coordinated that   [name], MD  [facility] [place] [email or web address] 7PM-7AM contact night coverage as above 02/19/2018, 12:57 PM  LOS: 3 days   Consultants:  IR  Renal  Procedures:    Antimicrobials:  None  Interval history/Subjective:  Eating and drinking well Drain was internalized yesterday She is not having any nausea or abdominal pain No fever or chills   Objective:  Vitals:  Vitals:   02/19/18 0942 02/19/18 1246  BP:  (!) 116/51  Pulse:  70  Resp:    Temp:  97.8 F (36.6 C)  SpO2: 96% 99%    Exam:  Awake pleasant no distress EOMI NCAT Chest clear No rales no rhonchi Abdomen soft drain now internalized No lower extremity edema No abdominal discomfort Moving all 4 limbs equally no new focal neurological deficit  I have personally reviewed the following:  DATA  Labs:  Sodium 130-134  Potassium 3.6-3.1  Chloride up to 81, bicarb 33  BUN/creatinine has dropped from 102/5 0.7-90 3/5.1  Alk phos 182 down from 280  AST/ALT  290/505-->56/239  Bilirubin 1.8   Scheduled Meds: . mouth rinse  15 mL Mouth Rinse BID  . mometasone-formoterol  2 puff Inhalation BID  . nebivolol  10 mg Oral Daily  . pantoprazole  40 mg Oral Daily  . potassium chloride  40 mEq Oral Daily  . traMADol  50 mg Oral BID   Continuous Infusions: . sodium chloride 75 mL/hr at 02/19/18 1244  . heparin 1,250 Units/hr (02/19/18 1037)  . sodium chloride      Principal Problem:   ARF (acute renal failure) (HCC) Active Problems:   Paroxysmal atrial fibrillation (HCC)   Chronic diastolic heart failure (HCC)   COPD mixed type (HCC)   Obstructive jaundice   Atrial fibrillation (HCC)   Hyperkalemia   LOS: 3 days

## 2018-02-19 NOTE — Care Management Obs Status (Deleted)
[place] NOTIFICATION   Patient Details  Name: Jordan Byrd MRN: [id number] Date of Birth: Oct 02, 1933   Medicare Observation Status Notification Given:  Yes    McGibboneyOletta Darter, RN 02/19/2018, 10:57 AM

## 2018-02-19 NOTE — Care Management Important Message (Signed)
Important Message  Patient Details  Name: Jordan Byrd MRN: [id number] Date of Birth: [birth year]   Medicare Important Message Given:  Yes. CMA cannot accept patient's signature. CMA gave patient her IM and explained her Medicare Rights.     Kairie Vangieson 02/19/2018, 9:26 AM

## 2018-02-19 NOTE — Plan of Care (Signed)
Pt denies nausea and appetite has improved. Pt feeling better tonight and joking with staff. Pt has had 3 incontinent watery stools this shift. Nothing since 2330. Pt has had good urine OP this shift.

## 2018-02-19 NOTE — Progress Notes (Signed)
Subjective:  UOP cont to inc- 89 yest- BUN and crt down and sodium up some- now with Purewick   Objective Vital signs in last 24 hours: Vitals:   02/18/18 2206 02/19/18 0518 02/19/18 0936 02/19/18 0942  BP:  (!) 158/55    Pulse:  69    Resp:  18    Temp:  (!) 97.5 F (36.4 C)    TempSrc:  Oral    SpO2: 96% 96% 96% 96%  Weight:  104.7 kg    Height:       Weight change: 2.536 kg  Intake/Output Summary (Last 24 hours) at 02/19/2018 1226 Last data filed at 02/19/2018 1037 Gross per 24 hour  Intake 3302.59 ml  Output 1150 ml  Net 2152.59 ml    Assessment/ Plan: Pt is a 85 y.o. yo female who was admitted on 02/16/2018 with AKI in the setting of volume depletion due to high output chole drain   Assessment/Plan: 1. Renal - AKI- crt 0.87 on 1/8- now over 5.  U/A with moderate blood- RBC and WBC- really no protein- high spec gravity.  Seeming like ATN from volume depletion- imaging without hydro.  Continue with IV fluid repletion- changed to only NS.  No indications for HD and hopefully will not need  2. Hyponatremia- seeming like hypovolemic hyponatremia- giving NS  - slow to improve- better today  3. Anemia-not an issue- likely hemoconcentrated- did dec today (after biopsy) , cont to watch   4. HTN/volume- now I think tank is full- cont IVF now just NS at lower rate to compensate for drainage losses.  Have stopped amlodipine and decreased bystolic as I do not want BP to get too low.   5. Hyperkalemia- resolved, stop lokelma- now needs repletion   Jordan Byrd    Labs: Basic Metabolic Panel: Recent Labs  Lab 02/17/18 0531 02/18/18 0444 02/19/18 0416  NA 130* 130* 134*  K 5.2* 3.6 3.1*  CL 86* 76* 81*  CO2 18* 32 33*  GLUCOSE 110* 134* 105*  BUN 105* 102* 93*  CREATININE 5.67* 5.79* 5.19*  CALCIUM 8.4* 7.3* 6.7*  PHOS  --  9.7*  --    Liver Function Tests: Recent Labs  Lab 02/16/18 1820 02/17/18 0531 02/18/18 0444 02/19/18 0416  AST 372* 290*  --  56*   ALT 501* 505*  --  239*  ALKPHOS 325* 281*  --  182*  BILITOT 2.5* 2.6*  --  1.8*  PROT 8.4* 7.6  --  5.9*  ALBUMIN 3.6 3.6 2.9* 2.7*   No results for input(s): LIPASE, AMYLASE in the last 168 hours. No results for input(s): AMMONIA in the last 168 hours. CBC: Recent Labs  Lab 02/16/18 1820 02/17/18 0531 02/19/18 0416  WBC 28.3* 23.3* 11.8*  NEUTROABS 26.0*  --   --   HGB 13.9 13.0 10.0*  HCT 44.3 41.2 31.8*  MCV 91.3 90.9 90.9  PLT 570* 478* 320   Cardiac Enzymes: No results for input(s): CKTOTAL, CKMB, CKMBINDEX, TROPONINI in the last 168 hours. CBG: No results for input(s): GLUCAP in the last 168 hours.  Iron Studies: No results for input(s): IRON, TIBC, TRANSFERRIN, FERRITIN in the last 72 hours. Studies/Results: Dg Hand 2 View Right  Result Date: 02/18/2018 CLINICAL DATA:  85 year old who fell and injured the RIGHT index finger earlier today. EXAM: RIGHT HAND - 2 VIEW COMPARISON:  02/01/2018. FINDINGS: Examination is performed in the finger splint. No evidence of acute fracture or dislocation. Complete loss of the  joint spaces in the DIP joints of the index, long, ring and small fingers. Severe joint space narrowing involving the PIP joints of these fingers. Moderate narrowing of the IP joint space of the thumb and all of the MCP joint spaces. Severe narrowing of the trapezium-first metacarpal joint space in the wrist with intra-articular loose bodies. IMPRESSION: 1. No acute osseous abnormality. 2. Severe osteoarthritis. Electronically Signed   By: Evangeline Dakin M.D.   On: 02/18/2018 19:30   Ct Biopsy  Result Date: 02/17/2018 INDICATION: 85 year old with history of obstructive jaundice and status post placement of right biliary drain. Patient has persistent dilated left biliary ducts with a poorly defined lesion in the left hepatic lobe. Patient needs a tissue diagnosis. EXAM: IMAGE GUIDED LEFT HEPATIC MASS BIOPSY WITH CT AND ULTRASOUND GUIDANCE MEDICATIONS: Sedation  medications one. ANESTHESIA/SEDATION: Moderate (conscious) sedation was employed during this procedure. A total of Versed 1.5 mg and Fentanyl 75 mcg was administered intravenously. Moderate Sedation Time: 28 minutes. The patient's level of consciousness and vital signs were monitored continuously by radiology nursing throughout the procedure under my direct supervision. FLUOROSCOPY TIME:  None COMPLICATIONS: None immediate. PROCEDURE: Informed written consent was obtained from the patient after a thorough discussion of the procedural risks, benefits and alternatives. All questions were addressed. Maximal Sterile Barrier Technique was utilized including caps, mask, sterile gowns, sterile gloves, sterile drape, hand hygiene and skin antiseptic. A timeout was performed prior to the initiation of the procedure. Liver was evaluated with ultrasound and CT. Anterior abdomen was prepped with chlorhexidine and sterile field was created. Skin and soft tissues anesthetized with 1% lidocaine. 66 gauge coaxial needle directed into the poorly defined area or lesion in the left hepatic lobe with ultrasound guidance. Needle position was confirmed with CT. Total of 3 core biopsies were obtained with an 18 gauge device. Specimens placed in formalin. 17 gauge needle was removed without complication. Bandage placed over the puncture site. FINDINGS: Poorly defined area in the medial left hepatic lobe is concerning for a mass based on the previous cross-sectional imaging. Dilated left biliary ducts lateral to the poorly defined area. Needle position was confirmed within the lesion. Three core biopsies were obtained. Gas within the tissue and bile ducts along the biopsy tract. No significant bleeding or hematoma formation. IMPRESSION: Image guided core biopsies of left hepatic lobe. Electronically Signed   By: Markus Daft M.D.   On: 02/17/2018 17:10   Ir Abdomen US Limited  Result Date: 02/17/2018 INDICATION: 85 year old with history  of obstructive jaundice and status post placement of right biliary drain. Patient has persistent dilated left biliary ducts with a poorly defined lesion in the left hepatic lobe. Patient needs a tissue diagnosis. EXAM: IMAGE GUIDED LEFT HEPATIC MASS BIOPSY WITH CT AND ULTRASOUND GUIDANCE MEDICATIONS: Sedation medications one. ANESTHESIA/SEDATION: Moderate (conscious) sedation was employed during this procedure. A total of Versed 1.5 mg and Fentanyl 75 mcg was administered intravenously. Moderate Sedation Time: 28 minutes. The patient's level of consciousness and vital signs were monitored continuously by radiology nursing throughout the procedure under my direct supervision. FLUOROSCOPY TIME:  None COMPLICATIONS: None immediate. PROCEDURE: Informed written consent was obtained from the patient after a thorough discussion of the procedural risks, benefits and alternatives. All questions were addressed. Maximal Sterile Barrier Technique was utilized including caps, mask, sterile gowns, sterile gloves, sterile drape, hand hygiene and skin antiseptic. A timeout was performed prior to the initiation of the procedure. Liver was evaluated with ultrasound and CT. Anterior abdomen was  prepped with chlorhexidine and sterile field was created. Skin and soft tissues anesthetized with 1% lidocaine. 68 gauge coaxial needle directed into the poorly defined area or lesion in the left hepatic lobe with ultrasound guidance. Needle position was confirmed with CT. Total of 3 core biopsies were obtained with an 18 gauge device. Specimens placed in formalin. 17 gauge needle was removed without complication. Bandage placed over the puncture site. FINDINGS: Poorly defined area in the medial left hepatic lobe is concerning for a mass based on the previous cross-sectional imaging. Dilated left biliary ducts lateral to the poorly defined area. Needle position was confirmed within the lesion. Three core biopsies were obtained. Gas within the  tissue and bile ducts along the biopsy tract. No significant bleeding or hematoma formation. IMPRESSION: Image guided core biopsies of left hepatic lobe. Electronically Signed   By: Markus Daft M.D.   On: 02/17/2018 17:10   Medications: Infusions: . sodium chloride 125 mL/hr at 02/19/18 1037  . heparin 1,250 Units/hr (02/19/18 1037)  . sodium chloride      Scheduled Medications: . mouth rinse  15 mL Mouth Rinse BID  . mometasone-formoterol  2 puff Inhalation BID  . nebivolol  10 mg Oral Daily  . pantoprazole  40 mg Oral Daily  . potassium chloride  40 mEq Oral Daily  . traMADol  50 mg Oral BID    have reviewed scheduled and prn medications.  Physical Exam: General: NAD Heart: RRR Lungs: mostly clear Abdomen: distended- drain in place but no drainage bag Extremities: no peripheral edema    02/19/2018,12:26 PM  LOS: 3 days

## 2018-02-19 NOTE — Progress Notes (Addendum)
Referring Physician(s): Reece Levy  Supervising Physician: Markus Daft  Patient Status:  [facility] - In-pt  Chief Complaint: Abdominal pain, obstructive jaundice/pancreatobiliary adenocarcinoma   Subjective: Pt doing ok; denies sig abd pain,N/V   Allergies: Methocarbamol; Vicodin [hydrocodone-acetaminophen]; Aspirin; Butazolidin [phenylbutazone]; Celebrex [celecoxib]; Codeine; Doripenem; Aspirin buf(alhyd-mghyd-cacar); Mucinex [guaifenesin er]; and Temazepam  Medications: Prior to Admission medications   Medication Sig Start Date End Date Taking? Authorizing Provider  albuterol (PROVENTIL HFA;VENTOLIN HFA) 108 (90 BASE) MCG/ACT inhaler Inhale 1 puff into the lungs every 6 (six) hours as needed for wheezing. Reported on 04/10/2015   Yes [provider]  amLODipine (NORVASC) 10 MG tablet Take 10 mg by mouth daily.   Yes [provider]  budesonide-formoterol (SYMBICORT) 160-4.5 MCG/ACT inhaler Inhale 2 puffs into the lungs See admin instructions. Inhale 1 puff every morning, may inhale a 2nd puff in the evening if needed for wheezing   Yes [provider]  feeding supplement, ENSURE ENLIVE, (ENSURE ENLIVE) LIQD Take 237 mLs by mouth 3 (three) times daily between meals. 02/12/18  Yes Hongalgi, Lenis Dickinson, MD  Multiple Vitamin (MULTIVITAMIN WITH MINERALS) TABS tablet Take 1 tablet by mouth daily.   Yes [provider]  Nebivolol HCl (BYSTOLIC) 20 MG TABS Take 1 tablet (20 mg total) by mouth daily. 10/24/15  Yes Winslow Ederer, Jeneen Rinks, MD  nitroGLYCERIN (NITROSTAT) 0.4 MG SL tablet Place 0.4 mg under the tongue every 5 (five) minutes as needed for chest pain.   Yes [provider]  omeprazole (PRILOSEC) 20 MG capsule Take 20 mg by mouth daily.   Yes [provider]  ondansetron (ZOFRAN) 4 MG tablet Take 1 tablet (4 mg total) by mouth every 8 (eight) hours as needed for nausea or vomiting. 02/12/18  Yes Hongalgi, Lenis Dickinson, MD  polyethylene glycol (MIRALAX  / GLYCOLAX) packet Take 17 g by mouth 2 (two) times daily. 02/12/18  Yes Hongalgi, Lenis Dickinson, MD  rivaroxaban (XARELTO) 20 MG TABS tablet Take 1 tablet (20 mg total) by mouth daily with supper. 10/24/15  Yes Jair Lindblad, Jeneen Rinks, MD  senna-docusate (SENOKOT-S) 8.6-50 MG tablet Take 1 tablet by mouth 2 (two) times daily. 02/12/18  Yes Hongalgi, Lenis Dickinson, MD  traMADol (ULTRAM) 50 MG tablet Take 1 tablet (50 mg total) by mouth 2 (two) times daily. 02/12/18  Yes Hongalgi, Lenis Dickinson, MD     Vital Signs: BP (!) 116/51 (BP Location: Right Arm)   Pulse 70   Temp 97.8 F (36.6 C) (Oral)   Resp 18   Ht 5\' 6"  (1.676 m)   Wt 230 lb 14.4 oz (104.7 kg)   SpO2 99%   BMI 37.27 kg/m   Physical Exam rt biliary drain intact, capped, insertion site clean and dry, NT;  abd soft  Imaging: Dg Hand 2 View Right  Result Date: 02/18/2018 CLINICAL DATA:  85 year old who fell and injured the RIGHT index finger earlier today. EXAM: RIGHT HAND - 2 VIEW COMPARISON:  02/01/2018. FINDINGS: Examination is performed in the finger splint. No evidence of acute fracture or dislocation. Complete loss of the joint spaces in the DIP joints of the index, long, ring and small fingers. Severe joint space narrowing involving the PIP joints of these fingers. Moderate narrowing of the IP joint space of the thumb and all of the MCP joint spaces. Severe narrowing of the trapezium-first metacarpal joint space in the wrist with intra-articular loose bodies. IMPRESSION: 1. No acute osseous abnormality. 2. Severe osteoarthritis. Electronically Signed   By: Evangeline Dakin  M.D.   On: 02/18/2018 19:30   Ct Biopsy  Result Date: 02/17/2018 INDICATION: 85 year old with history of obstructive jaundice and status post placement of right biliary drain. Patient has persistent dilated left biliary ducts with a poorly defined lesion in the left hepatic lobe. Patient needs a tissue diagnosis. EXAM: IMAGE GUIDED LEFT HEPATIC MASS BIOPSY WITH CT AND ULTRASOUND GUIDANCE  MEDICATIONS: Sedation medications one. ANESTHESIA/SEDATION: Moderate (conscious) sedation was employed during this procedure. A total of Versed 1.5 mg and Fentanyl 75 mcg was administered intravenously. Moderate Sedation Time: 28 minutes. The patient's level of consciousness and vital signs were monitored continuously by radiology nursing throughout the procedure under my direct supervision. FLUOROSCOPY TIME:  None COMPLICATIONS: None immediate. PROCEDURE: Informed written consent was obtained from the patient after a thorough discussion of the procedural risks, benefits and alternatives. All questions were addressed. Maximal Sterile Barrier Technique was utilized including caps, mask, sterile gowns, sterile gloves, sterile drape, hand hygiene and skin antiseptic. A timeout was performed prior to the initiation of the procedure. Liver was evaluated with ultrasound and CT. Anterior abdomen was prepped with chlorhexidine and sterile field was created. Skin and soft tissues anesthetized with 1% lidocaine. 69 gauge coaxial needle directed into the poorly defined area or lesion in the left hepatic lobe with ultrasound guidance. Needle position was confirmed with CT. Total of 3 core biopsies were obtained with an 18 gauge device. Specimens placed in formalin. 17 gauge needle was removed without complication. Bandage placed over the puncture site. FINDINGS: Poorly defined area in the medial left hepatic lobe is concerning for a mass based on the previous cross-sectional imaging. Dilated left biliary ducts lateral to the poorly defined area. Needle position was confirmed within the lesion. Three core biopsies were obtained. Gas within the tissue and bile ducts along the biopsy tract. No significant bleeding or hematoma formation. IMPRESSION: Image guided core biopsies of left hepatic lobe. Electronically Signed   By: Markus Daft M.D.   On: 02/17/2018 17:10   Dg Abdomen Acute W/chest  Result Date: 02/16/2018 CLINICAL  DATA:  Nausea, vomiting, diarrhea and abdominal pain. EXAM: DG ABDOMEN ACUTE W/ 1V CHEST COMPARISON:  Portable chest dated 02/08/2018. Chest CTA and abdomen and pelvis CT dated 06/2018. Abdomen MR dated 02/12/2018. FINDINGS: Normal sized heart. Clear lungs with normal vascularity. Left breast surgical clips. Cervical spine fixation hardware. Normal bowel gas pattern without free peritoneal air. Stable percutaneous biliary drainage catheter and right hip prosthesis. Lumbar and thoracic spine degenerative changes. IMPRESSION: No acute abnormality. Electronically Signed   By: Claudie Revering M.D.   On: 02/16/2018 17:35   Ir Abdomen US Limited  Result Date: 02/17/2018 INDICATION: 85 year old with history of obstructive jaundice and status post placement of right biliary drain. Patient has persistent dilated left biliary ducts with a poorly defined lesion in the left hepatic lobe. Patient needs a tissue diagnosis. EXAM: IMAGE GUIDED LEFT HEPATIC MASS BIOPSY WITH CT AND ULTRASOUND GUIDANCE MEDICATIONS: Sedation medications one. ANESTHESIA/SEDATION: Moderate (conscious) sedation was employed during this procedure. A total of Versed 1.5 mg and Fentanyl 75 mcg was administered intravenously. Moderate Sedation Time: 28 minutes. The patient's level of consciousness and vital signs were monitored continuously by radiology nursing throughout the procedure under my direct supervision. FLUOROSCOPY TIME:  None COMPLICATIONS: None immediate. PROCEDURE: Informed written consent was obtained from the patient after a thorough discussion of the procedural risks, benefits and alternatives. All questions were addressed. Maximal Sterile Barrier Technique was utilized including caps, mask, sterile gowns, sterile  gloves, sterile drape, hand hygiene and skin antiseptic. A timeout was performed prior to the initiation of the procedure. Liver was evaluated with ultrasound and CT. Anterior abdomen was prepped with chlorhexidine and sterile  field was created. Skin and soft tissues anesthetized with 1% lidocaine. 17 gauge coaxial needle directed into the poorly defined area or lesion in the left hepatic lobe with ultrasound guidance. Needle position was confirmed with CT. Total of 3 core biopsies were obtained with an 18 gauge device. Specimens placed in formalin. 17 gauge needle was removed without complication. Bandage placed over the puncture site. FINDINGS: Poorly defined area in the medial left hepatic lobe is concerning for a mass based on the previous cross-sectional imaging. Dilated left biliary ducts lateral to the poorly defined area. Needle position was confirmed within the lesion. Three core biopsies were obtained. Gas within the tissue and bile ducts along the biopsy tract. No significant bleeding or hematoma formation. IMPRESSION: Image guided core biopsies of left hepatic lobe. Electronically Signed   By: Markus Daft M.D.   On: 02/17/2018 17:10   Ct Renal Stone Study  Result Date: 02/16/2018 CLINICAL DATA:  Flank pain. Renal insufficiency. Nausea and vomiting. EXAM: CT ABDOMEN AND PELVIS WITHOUT CONTRAST TECHNIQUE: Multidetector CT imaging of the abdomen and pelvis was performed following the standard protocol without IV contrast. COMPARISON:  Chest, abdomen and pelvis radiographs obtained earlier today. Chest CTA and abdomen and pelvis CT dated 02/07/2018. Abdomen and pelvis CT dated 02/01/2018. FINDINGS: Lower chest: Multiple small nodules at both lung bases. The largest is in the right lower lobe, measuring 5 mm on image number 26 series 4. This was not present on the previous examinations and multiple other nodules are also new on both sides. Hepatobiliary: Previously demonstrated percutaneous biliary drainage catheter, poorly defined left lobe liver mass and markedly dilated left lobe bile ducts. Cholecystectomy clips. Pancreas: Unremarkable. No pancreatic ductal dilatation or surrounding inflammatory changes. Spleen: Normal in  size without focal abnormality. Adrenals/Urinary Tract: Normal appearing adrenal glands. Small lower pole right renal cyst. Normal appearing left kidney, ureters and urinary bladder. Portions of the urinary bladder and distal ureters are obscured by streak artifacts from a right hip prosthesis. Stomach/Bowel: Surgically absent appendix. Unremarkable stomach, small bowel and colon. Vascular/Lymphatic: Atheromatous arterial calcifications without aneurysm. No enlarged lymph nodes. Reproductive: Status post hysterectomy. No adnexal masses. Other: No abdominal wall hernia or abnormality. No abdominopelvic ascites. Musculoskeletal: Right hip prosthesis with associated streak artifacts. Lumbar and lower thoracic spine degenerative changes. IMPRESSION: 1. No acute abnormality. 2. Multiple interval small nodules at both lung bases suspicious for metastases. 3. Previously demonstrated left lobe liver mass causing left lobe liver biliary obstruction. Electronically Signed   By: Claudie Revering M.D.   On: 02/16/2018 21:07    Labs:  CBC: Recent Labs    02/10/18 0642 02/16/18 1820 02/17/18 0531 02/19/18 0416  WBC 12.1* 28.3* 23.3* 11.8*  HGB 10.3* 13.9 13.0 10.0*  HCT 34.3* 44.3 41.2 31.8*  PLT 447* 570* 478* 320    COAGS: Recent Labs    02/01/18 1712  02/08/18 0531 02/08/18 1140 02/09/18 0512 02/09/18 2236 02/16/18 1820 02/19/18 0416  INR 1.16  --   --   --  1.10  --  1.49  --   APTT  --    < > 84* 91*  --  35  --  52*   < > = values in this interval not displayed.    BMP: Recent Labs    02/16/18  1820 02/17/18 0531 02/18/18 0444 02/19/18 0416  NA 128* 130* 130* 134*  K 6.1* 5.2* 3.6 3.1*  CL 88* 86* 76* 81*  CO2 18* 18* 32 33*  GLUCOSE 122* 110* 134* 105*  BUN 100* 105* 102* 93*  CALCIUM 8.9 8.4* 7.3* 6.7*  CREATININE 5.29* 5.67* 5.79* 5.19*  GFRNONAA 7* 6* 6* 7*  GFRAA 8* 7* 7* 8*    LIVER FUNCTION TESTS: Recent Labs    02/12/18 0628 02/16/18 1820 02/17/18 0531  02/18/18 0444 02/19/18 0416  BILITOT 2.0* 2.5* 2.6*  --  1.8*  AST 59* 372* 290*  --  56*  ALT 151* 501* 505*  --  239*  ALKPHOS 340* 325* 281*  --  182*  PROT 6.7 8.4* 7.6  --  5.9*  ALBUMIN 2.6* 3.6 3.6 2.9* 2.7*    Assessment and Plan: Patient with history of obstructive jaundice/persistently dilated left biliary ducts and prior right biliary drain placement on 02/02/2018 with exchange and upsizing on 02/09/2018 along with negative common bile duct brush biopsy.  Status post left hepatic lobe lesion biopsy on 1/15,path reveals pancreatobiliary adenocarcinoma; Afebrile; WBC 11.8(23.3), hgb 10, creat 5.19(5.79), t bili 1.8(2.6); cont current tx; hydrate; no additional biliary interv planned at this time; consider surg consult with hepatobiliary specialist (Dr. Barry Dienes)  Electronically Signed: D. Rowe Robert, PA-C 02/19/2018, 3:41 PM   I spent a total of 15 minutes at the the patient's bedside AND on the patient's hospital floor or unit, greater than 50% of which was counseling/coordinating care for biliary drain    Patient ID: Jordan Byrd, female   DOB: [birth year], 85 y.o.   MRN: [id number]

## 2018-02-19 NOTE — Progress Notes (Signed)
Foley clamped to see if patient has sensation to urinate. Will continue to monitor.

## 2018-02-19 NOTE — Progress Notes (Signed)
[place] for heparin Indication: atrial fibrillation  Allergies  Allergen Reactions  . Methocarbamol Hives  . Vicodin [Hydrocodone-Acetaminophen] Other (See Comments)    Interacts with bp medication and drops blood pressure  . Aspirin Nausea And Vomiting  . Butazolidin [Phenylbutazone] Other (See Comments)    Unknown reaction  . Celebrex [Celecoxib] Nausea And Vomiting  . Codeine Nausea And Vomiting  . Doripenem Rash  . Aspirin Buf(Alhyd-Mghyd-Cacar) Nausea And Vomiting  . Mucinex [Guaifenesin Er] Itching  . Temazepam Other (See Comments)    Unknown reaction    Patient Measurements: Height: 5\' 6"  (167.6 cm) Weight: 230 lb 14.4 oz (104.7 kg) IBW/kg (Calculated) : 59.3 Heparin Dosing Weight: 82.5 kg  Vital Signs: Temp: 97.8 F (36.6 C) (01/17 1246) Temp Source: Oral (01/17 1246) BP: 116/51 (01/17 1246) Pulse Rate: 70 (01/17 1246)  Labs: Recent Labs    02/16/18 1820 02/17/18 0531 02/18/18 0444 02/19/18 0416  HGB 13.9 13.0  --  10.0*  HCT 44.3 41.2  --  31.8*  PLT 570* 478*  --  320  APTT  --   --   --  52*  LABPROT 17.9*  --   --   --   INR 1.49  --   --   --   HEPARINUNFRC  --   --   --  0.39  CREATININE 5.29* 5.67* 5.79* 5.19*    Estimated Creatinine Clearance: 10.2 mL/min (A) (by C-G formula based on SCr of 5.19 mg/dL (H)).   Medical History: Past Medical History:  Diagnosis Date  . Anemia   . Arthritis    "all over" (02/01/2018)  . Asthma   . Atrial fibrillation ([place])    on Xarelto  . Breast cancer, left breast ([place]) 05/19/13   left breast stage IIA   . CHF (congestive heart failure) ([place])   . COPD (chronic obstructive pulmonary disease) ([place])   . GERD (gastroesophageal reflux disease)    [name] Prilosec  . Hypertension   . Macular degeneration   . Obstructive jaundice 02/01/2018  . On home oxygen therapy    "2.5L prn" (02/01/2018)  . Osteoarthritis    a. 03/28/11 - R Total Hip Arthroplasty  . Pleurisy    . Pneumonia    "2-3 times" (02/01/2018)    Medications:  Xarelto 20 mg daily PTA > Lovenox 1 mg/kg sq daily > heparin infusion (last dose of Xarelto 1/13)  Assessment: 85 y/o F with a h/o chronic atrial fibrillation on Xarelto PTA and recent finding of hepatic mass s/o IR drain admitted with ARF due to high-output bili drain. Patient was placed on Lovenox bridge therapy due to AKI. Plan to transition to UFH as a safer option in the setting of ARF with SCR 5.79. Patient received a dose of Lovenox 1 mg/kg 1/16 @ 1030.    Discussed with Dr [name]- renal function is recovering (although calculated level only ~16ml/min today) & he would like to resume Xarelto today.  Plan:  D/C heparin & heparin labs Xarelto 15mg  PO daily with food (for CrCl 15-12ml/min) Monitor for signs/symptoms of bleeding and renal function  Khalani Novoa, [place] Drafts 02/19/2018,1:52 PM

## 2018-02-19 NOTE — Progress Notes (Signed)
PT Cancellation Note  Patient Details Name: Jordan Byrd MRN: [id number] DOB: 1934/01/16   Cancelled Treatment:    Reason Eval/Treat Not Completed: Other (comment).  Pt was eating a meal when PT first attempted, then needed nursing to assist her to get cleaned up.  Will try again at another time.   Ramond Dial 02/19/2018, 6:19 PM  Mee Hives, PT MS Acute Rehab Dept. Number: [place] and [place]

## 2018-02-19 NOTE — Care Management Note (Addendum)
Case Management Note  Patient Details  Name: Jordan Byrd MRN: [id number] Date of Birth: 11-04-1933  Subjective/Objective:                    Action/Plan:Pt going to SNF, CSW to follow.   Expected Discharge Date:  (unknown)               Expected Discharge Plan:  SNF  In-House Referral:     Discharge planning Services  CM Consult  Post Acute Care Choice:    Choice offered to:  Patient  DME Arranged:    DME Agency:     HH Arranged:    [place] Agency:     Status of Service:  Completed, signed off  If discussed at [employer] of Stay Meetings, dates discussed:    Additional CommentsPurcell Mouton, RN 02/19/2018, 10:55 AM

## 2018-02-19 NOTE — Progress Notes (Signed)
Pt unable to void 8 hours post foley removal. Bladder scan showed 317 ml's. In and out cath reveal 600 ml's of clear, yellow urine. Pt tolerated well.

## 2018-02-19 NOTE — Progress Notes (Signed)
Pt admitted from [facility] SNF where she had been DC'd to rehab from [facility] a few days ago.  Met with grandson in room (pt having care received and did not participate). Explains pt has been to SNFs for rehab several times in the past and they are very familiar with nature of goals there. States pt could not participate in therapy while at [facility] due to discomfort and pain. Is unsure if rehab will be viable option at DC. Open to evaluating needs pt's treatment in hospital progresses. Pt will need new [facility] authorization for admission if she does return to a SNF. CSWs will follows to assist team and family with planning for DC.  Sharren Bridge, MSW, LCSW Clinical Social Work 02/19/2018 [phone]  Assessment from recent admission at [facility] included below for history  Clinical Social Work Assessment  Patient Details  Name: Jordan Byrd MRN: [id number] Date of Birth: [birth year]  Date of referral:  02/05/18               Reason for consult:  Facility Placement, Discharge Planning                    Permission sought to share information with:  Family Supports Permission granted to share information::  Yes, Verbal Permission Granted             Name::     Jordan Byrd::                Relationship::  Grandson             Contact Information:  [phone]  Housing/Transportation Living arrangements for the past 2 months:  [place] of Information:  Patient, Other (Comment Required)(Grandson Jordan Byrd) Patient Interpreter Needed:  None Criminal Activity/Legal Involvement Pertinent to Current Situation/Hospitalization:  No - Comment as needed Significant Relationships:  Adult Children, Other Family Members(Grandson Jordan Byrd) Lives with:  Self Do you feel safe going back to the place where you live?  Yes(Patient feels safe at home, but is agreeable to going to SNF for rehab before returning home ) Need for family participation in patient  care:  Yes (Comment)  Care giving concerns: Patient expressed agreement with ST rehab before returning home as she is currently unable to transfer and ambulate.  Social Worker assessment / plan:  CSW talked with patient at the bedside regarding discharge disposition and recommendation of ST rehab. Patient was lying in bed and was alert, oriented and participated in the discharge planning conversation. Also at the bedside was her grandson Jordan Byrd and his wife. Patient reported that she has been to [facility] before and wants to return to this facility for rehab. When asked, she did provided a 2nd choice ([facility]).   Jordan Byrd informed CSW that she has 3 children, however she does not have contact with 2 of them. Her daughter Jordan Byrd is involved per patient.  Employment status:  Retired Charity fundraiser) PT Recommendations:  [place] / Referral to community resources:  Skilled Nursing Facility(SNF list not given as patient provided CSW with 1st and 2nd choices for SNF)  Patient/Family's Response to care:  No concerns expressed by patient or family regarding her care during hospitalization.  Patient/Family's Understanding of and Emotional Response to Diagnosis, Current Treatment, and Prognosis:  Patient and grandson expressed understanding of her need for rehab and Mrs.  Byrd is willing to discharge to a SNF for ST rehab.  Emotional Assessment Appearance:  Appears stated age Attitude/Demeanor/Rapport:  Engaged Affect (typically observed):  Appropriate, Pleasant Orientation:  Oriented to Self Alcohol / Substance use:  Tobacco Use, Alcohol Use, Illicit Drugs(Patient reported (per H&P) that she quit smoking and does not drink alcohol or use illicit drugs) Psych involvement (Current and /or in the community):  No (Comment)  Discharge Needs  Concerns to be addressed:  Discharge Planning Concerns Readmission  within the last 30 days:  No Current discharge risk:  None Barriers to Discharge:  Continued Medical Work up   [employer] Jordan Byrd, [place] 02/05/2018, 1:35 PM

## 2018-02-19 NOTE — Progress Notes (Deleted)
Pt able to void post foley removal. Unable to measure d/t incontinence. Pt does not currently have the urge or sensation to void. Will continue to monitor.

## 2018-02-20 LAB — RENAL FUNCTION PANEL
Albumin: 2.7 g/dL — ABNORMAL LOW (ref 3.5–5.0)
Anion gap: 16 — ABNORMAL HIGH (ref 5–15)
BUN: 78 mg/dL — ABNORMAL HIGH (ref 8–23)
CO2: 30 mmol/L (ref 22–32)
Calcium: 6.7 mg/dL — ABNORMAL LOW (ref 8.9–10.3)
Chloride: 90 mmol/L — ABNORMAL LOW (ref 98–111)
Creatinine, Ser: 4.55 mg/dL — ABNORMAL HIGH (ref 0.44–1.00)
GFR calc Af Amer: 10 mL/min — ABNORMAL LOW (ref >60)
GFR calc non Af Amer: 8 mL/min — ABNORMAL LOW (ref >60)
Glucose, Bld: 107 mg/dL — ABNORMAL HIGH (ref 70–99)
Phosphorus: 5 mg/dL — ABNORMAL HIGH (ref 2.5–4.6)
Potassium: 3.4 mmol/L — ABNORMAL LOW (ref 3.5–5.1)
Sodium: 136 mmol/L (ref 135–145)

## 2018-02-20 LAB — HEPATIC FUNCTION PANEL
ALT: 156 U/L — ABNORMAL HIGH (ref 0–44)
AST: 29 U/L (ref 15–41)
Albumin: 2.7 g/dL — ABNORMAL LOW (ref 3.5–5.0)
Alkaline Phosphatase: 162 U/L — ABNORMAL HIGH (ref 38–126)
Bilirubin, Direct: 0.7 mg/dL — ABNORMAL HIGH (ref 0.0–0.2)
Indirect Bilirubin: 1.1 mg/dL — ABNORMAL HIGH (ref 0.3–0.9)
Total Bilirubin: 1.8 mg/dL — ABNORMAL HIGH (ref 0.3–1.2)
Total Protein: 6 g/dL — ABNORMAL LOW (ref 6.5–8.1)

## 2018-02-20 LAB — CBC
HCT: 31.9 % — ABNORMAL LOW (ref 36.0–46.0)
Hemoglobin: 9.8 g/dL — ABNORMAL LOW (ref 12.0–15.0)
MCH: 28.4 pg (ref 26.0–34.0)
MCHC: 30.7 g/dL (ref 30.0–36.0)
MCV: 92.5 fL (ref 80.0–100.0)
Platelets: 358 10*3/uL (ref 150–400)
RBC: 3.45 MIL/uL — ABNORMAL LOW (ref 3.87–5.11)
RDW: 16.9 % — AB (ref 11.5–15.5)
WBC: 14.5 10*3/uL — ABNORMAL HIGH (ref 4.0–10.5)
nRBC: 0 % (ref 0.0–0.2)

## 2018-02-20 MED ORDER — TRAZODONE HCL 50 MG PO TABS
50.0000 mg | ORAL_TABLET | Freq: Once | ORAL | Status: AC
  Administered 2018-02-20: 50 mg via ORAL
  Filled 2018-02-20: qty 1

## 2018-02-20 NOTE — Plan of Care (Signed)
Pt continues to be unable to void. Pt put on BSC to aid in possible urine elimination w/o success. On call notified  @ [place] on 1/18 that [facility] had been remove at approx. 1100 on 1/17 and pt had been I&O cathed, but continued to struggle to urinate. New order to place [facility] received. Bladder scan had revealed 327 mls, however after FC placed, 600 mls return immediately.

## 2018-02-20 NOTE — Progress Notes (Signed)
Subjective:  UOP cont to inc- 1400 yest- BUT had to have foley cath replaced due to retention. BUN and crt down and sodium up    Objective Vital signs in last 24 hours: Vitals:   02/19/18 1246 02/19/18 2058 02/20/18 0512 02/20/18 0700  BP: (!) 116/51 (!) 142/58 (!) 156/53   Pulse: 70 69 70   Resp:  18 18   Temp: 97.8 F (36.6 C) 98.5 F (36.9 C) 97.7 F (36.5 C)   TempSrc: Oral Oral Oral   SpO2: 99% 98% 96%   Weight:    107.2 kg  Height:       Weight change: 2.464 kg  Intake/Output Summary (Last 24 hours) at 02/20/2018 1137 Last data filed at 02/20/2018 0600 Gross per 24 hour  Intake 1793.78 ml  Output 1200 ml  Net 593.78 ml    Assessment/ Plan: Pt is a 85 y.o. yo female who was admitted on 02/16/2018 with AKI in the setting of volume depletion due to high output chole drain- thought to have malignancy in biliary tract    Assessment/Plan: 1. Renal - AKI- crt 0.87 on 1/8- then over 5.  U/A with moderate blood- RBC and WBC- really no protein- high spec gravity.  Seeming like ATN from volume depletion- imaging without hydro.  Continue with IV fluid repletion- changed to only NS.  No indications for HD and hopefully will not need - now really seeing trend for the better - I am optimistic for continued recovery  2. Hyponatremia- seeming like hypovolemic hyponatremia- giving NS  -- better today  3. Anemia-not an issue initially - likely hemoconcentrated- did dec again today (after biopsy) , cont to watch   4. HTN/volume- now I think tank is full- stop  IVF.  Have stopped amlodipine and decreased bystolic as I do not want BP to get too low.   5. Hyperkalemia- resolved, stopped lokelma-  6. 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- per onc and surgery - drain internalized   [name]    Labs: Basic Metabolic Panel: Recent Labs  Lab 02/18/18 0444 02/19/18 0416 02/20/18 0434  NA 130* 134* 136  K 3.6 3.1* 3.4*  CL 76* 81* 90*  CO2 32  33* 30  GLUCOSE 134* 105* 107*  BUN 102* 93* 78*  CREATININE 5.79* 5.19* 4.55*  CALCIUM 7.3* 6.7* 6.7*  PHOS 9.7*  --  5.0*   Liver Function Tests: Recent Labs  Lab 02/17/18 0531 02/18/18 0444 02/19/18 0416 02/20/18 0434  AST 290*  --  56* 29  ALT 505*  --  239* 156*  ALKPHOS 281*  --  182* 162*  BILITOT 2.6*  --  1.8* 1.8*  PROT 7.6  --  5.9* 6.0*  ALBUMIN 3.6 2.9* 2.7* 2.7*  2.7*   No results for input(s): LIPASE, AMYLASE in the last 168 hours. No results for input(s): AMMONIA in the last 168 hours. CBC: Recent Labs  Lab 02/16/18 1820 02/17/18 0531 02/19/18 0416 02/20/18 0434  WBC 28.3* 23.3* 11.8* 14.5*  NEUTROABS 26.0*  --   --   --   HGB 13.9 13.0 10.0* 9.8*  HCT 44.3 41.2 31.8* 31.9*  MCV 91.3 90.9 90.9 92.5  PLT 570* 478* 320 358   Cardiac Enzymes: No results for input(s): CKTOTAL, CKMB, CKMBINDEX, TROPONINI in the last 168 hours. CBG: No results for input(s): GLUCAP in the last 168 hours.  Iron Studies: No results for input(s): IRON, TIBC, TRANSFERRIN, FERRITIN in the last 72 hours. Studies/Results: Dg  Hand 2 View Right  Result Date: 02/18/2018 CLINICAL DATA:  85 year old who fell and injured the RIGHT index finger earlier today. EXAM: RIGHT HAND - 2 VIEW COMPARISON:  02/01/2018. FINDINGS: Examination is performed in the finger splint. No evidence of acute fracture or dislocation. Complete loss of the joint spaces in the DIP joints of the index, long, ring and small fingers. Severe joint space narrowing involving the PIP joints of these fingers. Moderate narrowing of the IP joint space of the thumb and all of the MCP joint spaces. Severe narrowing of the trapezium-first metacarpal joint space in the wrist with intra-articular loose bodies. IMPRESSION: 1. No acute osseous abnormality. 2. Severe osteoarthritis. Electronically Signed   By: Evangeline Dakin M.D.   On: 02/18/2018 19:30   Medications: Infusions: . sodium chloride 75 mL/hr at 02/20/18 0505  .  sodium chloride      Scheduled Medications: . mouth rinse  15 mL Mouth Rinse BID  . mometasone-formoterol  2 puff Inhalation BID  . nebivolol  10 mg Oral Daily  . pantoprazole  40 mg Oral Daily  . potassium chloride  40 mEq Oral Daily  . rivaroxaban  15 mg Oral Q supper  . traMADol  50 mg Oral BID    have reviewed scheduled and prn medications.  Physical Exam: General: seems a little more confused today ?  - wheezing- getting breathing tx  Heart: RRR Lungs: mostly clear Abdomen: distended- drain in place but no drainage bag Extremities: min  peripheral edema    02/20/2018,11:37 AM  LOS: 4 days

## 2018-02-20 NOTE — Progress Notes (Signed)
[facility] PROGRESS NOTE  Jordan Byrd TKW:[id number] DOB: 1933/03/26 DOA: 02/16/2018 PCP: Jordan Erp, MD   Narrative:  85 year old female Known history of chronic A. fib chads score >3 on Xarelto HTN Prior stage II breast cancer status post lumpectomy 2015 HFprEF EF 55-60% 07/13/2012 Stage III CKD creatinine last admission 1.09 Morbid obesity BMI 39 COPD on oxygen 2 L baseline  Recent admit 12/30-1/10 for obstructive painless jaundice and liver mass with concern for cholangiocarcinoma-at that admission she was found to have left lower lobe lesion IR evaluated after GI saw had 10 French internal/external drain to right liver lobe-CA-19-9 7571 Ca1 2567 CEA 4-she had a nonfunctioning drain on 1/5 associated with severe pain and had replacement of drain 1/7 no further findings-it was felt despite negative brushings that likely cholangiocarcinoma and less likely hepatocellular--- Jordan Byrd consulted + felt more palliative XRT chemo  She returns with adult failure to thrive nausea vomiting and a creatinine of 5 in the setting of these issues she has not been eating and drinking her discharge creatinine was 37/0.8 In addition she had another CT showing ill-defined hepatic lesion and significant dilation of the left hepatic ductal system and hence IR was consulted    A & Plan  Nausea vomiting malaise resolved-eating Acute kidney injury with hyperkalemia on admission Likely precipitated by BP meds ARB etc.-imprved-Saline d/c Urinary retention Continue foley as failed void trial and will need OP Urology input on d/c Hypoosmolar hyponatremia Urine osmolality was 353 sodium 11-continue IV saline at this time  Sodium is improved Atrial fibrillation chads score >3 Continue Bystolic 10 daily,resumed Xarelto Cholangiocarcinoma with right hepatic drain Had drain replaced 1/7 and 12 French internal/external biliary drain -it has consistently been putting output 6-700 cc and was  internalized 1/16 I d/w Jordan Byrd of IR who feels there may be a surgical option to remove the mass--Will need to contact Jordan Byrd of Surgery when she is available for a hepatic surgery opinion I discussed with family present need for OP follow-up Jordan Byrd as with current renal issue snot a candidate for chemo HFprEF Stable at this time-holding diuretics/ARB HTN Amlodipine d/w 1/17, nebivolol 20-->10 1/17 per neprho  losartan DC completely Right finger dislocation X-rays today of the hand do not reveal acute fracture--will remove splint Pulmonary nodules Warrants outpatient monitoring PET scan to be scheduled   DVT therapeutic heparin code Status: DNR communication: discussed with grandson on phone disposition Plan: Inpatient pending improvement of creatinine and nausea vomiting-await therapy eval/input--likely can d/c sson if creatining is improved for OP follow up for chemo  Jordan Au, MD  [facility] Via Jordan Byrd app OR [email or web address] 7PM-7AM contact night coverage as above 02/20/2018, 2:13 PM  LOS: 4 days   Consultants:  IR  Renal  Procedures:    Antimicrobials:  None  Interval history/Subjective:  Well Fees wheezy Eating no pain passing good urine No fever   Objective:  Vitals:  Vitals:   02/19/18 2058 02/20/18 0512  BP: (!) 142/58 (!) 156/53  Pulse: 69 70  Resp: 18 18  Temp: 98.5 F (36.9 C) 97.7 F (36.5 C)  SpO2: 98% 96%    Exam:   EOMI NCAT Chest slight wheeze no rales Abdomen soft --drain covered No lower extremity edema No abdominal discomfort Moving all 4 limbs equally no new focal neurological deficit  I have personally reviewed the following:  DATA   Labs:  Sodium 136  Potassium 3.4  Chloride up to 90, bicarb 30  BUN/creatinine has dropped  from 102/5.7--->78/4.55  Alk phos 162 down from 280  AST/ALT 290/505-->29/156  Bilirubin 1.8   Scheduled Meds: . mouth rinse  15 mL Mouth Rinse BID  . mometasone-formoterol   2 puff Inhalation BID  . nebivolol  10 mg Oral Daily  . pantoprazole  40 mg Oral Daily  . potassium chloride  40 mEq Oral Daily  . rivaroxaban  15 mg Oral Q supper  . traMADol  50 mg Oral BID   Continuous Infusions: . sodium chloride      Principal Problem:   ARF (acute renal failure) (HCC) Active Problems:   Paroxysmal atrial fibrillation (HCC)   Chronic diastolic heart failure (HCC)   COPD mixed type (HCC)   Obstructive jaundice   Atrial fibrillation (HCC)   Hyperkalemia   LOS: 4 days

## 2018-02-20 NOTE — Progress Notes (Signed)
Physical Therapy Evaluation Patient Details Name: Jordan Byrd MRN: [id number] DOB: [birth year] Today's Date: 02/20/2018   History of Present Illness  85 y.o. female was admitted for acute renal failure and note liver mass with drain was referred to PT for mobility evaluation.  Has sustained a recent fall at [facility], was on 2.5L O2 prior to admission.  PMHx:  HTN; CHF; remote breast cancer; afib,  jaundice, pleurisy, COPD, THA R hip, PNA, anemia,   Clinical Impression  Pt was seen for mobility of standing and transfers/transition OOB and to sit.  Her efforts were all good, resulting in attempted gait on RW.  However, cannot stand long or take a step, and this prevents her from being able to go home.  Family in to visit and are aware of her issues, and therefore will recommend SNF as pt and the family are agreeable to this.  Follow acutely in the mean time to increase LE strength and standing control and quality.    Follow Up Recommendations SNF    Equipment Recommendations  None recommended by PT    Recommendations for Other Services       Precautions / Restrictions Precautions Precautions: Fall Precaution Comments: Drain, watch O2 sats, mult. falls since November Restrictions Weight Bearing Restrictions: No      Mobility  Bed Mobility Overal bed mobility: Needs Assistance Bed Mobility: Supine to Sit;Sit to Supine     Supine to sit: Mod assist Sit to supine: Max assist   General bed mobility comments: assist for legs and trunk to sit up and for bil LEs for back to bed  Transfers Overall transfer level: Needs assistance Equipment used: Rolling walker (2 wheeled);1 person hand held assist Transfers: Sit to/from Stand Sit to Stand: Max assist;From elevated surface         General transfer comment: Pt is struggling to power up and cannot stand for more than 20 seconds initially  Ambulation/Gait             General Gait Details: unable to do more than  stand  Stairs            Wheelchair Mobility    Modified Rankin (Stroke Patients Only)       Balance Overall balance assessment: Needs assistance Sitting-balance support: Feet supported Sitting balance-Leahy Scale: Fair     Standing balance support: Bilateral upper extremity supported;During functional activity Standing balance-Leahy Scale: Poor                               Pertinent Vitals/Pain Pain Assessment: No/denies pain    Home Living Family/patient expects to be discharged to:: Private residence Living Arrangements: Alone Available Help at Discharge: Family;Available PRN/intermittently Type of Home: House Home Access: Ramped entrance     Home Layout: One level Home Equipment: Bedside commode;Walker - 2 wheels;Other (comment);Animator)      Prior Function Level of Independence: Independent with assistive device(s)         Comments: RW is main mobility tool     Hand Dominance   Dominant Hand: Right    Extremity/Trunk Assessment   Upper Extremity Assessment Upper Extremity Assessment: Overall WFL for tasks assessed    Lower Extremity Assessment Lower Extremity Assessment: Generalized weakness LLE Deficits / Details: has OA on LE's LLE Coordination: decreased fine motor;decreased gross motor    Cervical / Trunk Assessment Cervical / Trunk Assessment: Kyphotic  Communication   Communication: No  difficulties  Cognition Arousal/Alertness: Awake/alert Behavior During Therapy: WFL for tasks assessed/performed Overall Cognitive Status: Within Functional Limits for tasks assessed                                        General Comments General comments (skin integrity, edema, etc.): O2 sats were checked on the effort to stand, with sats dropping momentarily to 87% sitting and then back to 92%.  After standing was 90-91%.      Exercises     Assessment/Plan    PT Assessment Patient  needs continued PT services  PT Problem List Decreased strength;Decreased range of motion;Decreased activity tolerance;Decreased balance;Decreased mobility;Decreased coordination;Decreased safety awareness;Decreased knowledge of use of DME;Cardiopulmonary status limiting activity;Obesity;Decreased skin integrity;Pain       PT Treatment Interventions Functional mobility training;Therapeutic exercise;Therapeutic activities;Balance training;Patient/family education;Gait training    PT Goals (Current goals can be found in the Care Plan section)  Acute Rehab PT Goals Patient Stated Goal: to go to rehab then home PT Goal Formulation: With patient Time For Goal Achievement: 03/06/18 Potential to Achieve Goals: Good    Frequency Min 2X/week   Barriers to discharge Decreased caregiver support;Inaccessible home environment has difficulty standing and cannot roll a wc up a ramp    Co-evaluation               AM-PAC PT "6 Clicks" Mobility  Outcome Measure Help needed turning from your back to your side while in a flat bed without using bedrails?: A Little Help needed moving from lying on your back to sitting on the side of a flat bed without using bedrails?: A Little Help needed moving to and from a bed to a chair (including a wheelchair)?: Total Help needed standing up from a chair using your arms (e.g., wheelchair or bedside chair)?: Total Help needed to walk in hospital room?: Total Help needed climbing 3-5 steps with a railing? : Total 6 Click Score: 10    End of Session Equipment Utilized During Treatment: Gait belt;Oxygen Activity Tolerance: Patient limited by fatigue;Treatment limited secondary to medical complications (Comment) Patient left: in bed;with call bell/phone within reach;with bed alarm set;with family/visitor present Nurse Communication: Mobility status PT Visit Diagnosis: Unsteadiness on feet (R26.81);Muscle weakness (generalized) (M62.81);Difficulty in walking, not  elsewhere classified (R26.2)    Time: 5102-5852 PT Time Calculation (min) (ACUTE ONLY): 32 min   Charges:   PT Evaluation $PT Eval Moderate Complexity: 1 Mod PT Treatments $Neuromuscular Re-education: 8-22 mins         Ramond Dial 02/20/2018, 2:48 PM  Mee Hives, PT MS Acute Rehab Dept. Number: [place] and [place]

## 2018-02-20 NOTE — Plan of Care (Signed)
Tolerating renal diet without issue, no nausea, vomiting or abdominal pain on 7 a to 7 p shift.  Biliary drain remains capped.  Family members at bedside.

## 2018-02-21 LAB — RENAL FUNCTION PANEL
Albumin: 2.5 g/dL — ABNORMAL LOW (ref 3.5–5.0)
Anion gap: 13 (ref 5–15)
BUN: 66 mg/dL — ABNORMAL HIGH (ref 8–23)
CHLORIDE: 96 mmol/L — AB (ref 98–111)
CO2: 28 mmol/L (ref 22–32)
Calcium: 7.3 mg/dL — ABNORMAL LOW (ref 8.9–10.3)
Creatinine, Ser: 3.31 mg/dL — ABNORMAL HIGH (ref 0.44–1.00)
GFR calc Af Amer: 14 mL/min — ABNORMAL LOW (ref >60)
GFR calc non Af Amer: 12 mL/min — ABNORMAL LOW (ref >60)
Glucose, Bld: 119 mg/dL — ABNORMAL HIGH (ref 70–99)
Phosphorus: 3 mg/dL (ref 2.5–4.6)
Potassium: 3.8 mmol/L (ref 3.5–5.1)
Sodium: 137 mmol/L (ref 135–145)

## 2018-02-21 LAB — HEPATIC FUNCTION PANEL
ALT: 109 U/L — ABNORMAL HIGH (ref 0–44)
AST: 24 U/L (ref 15–41)
Albumin: 2.5 g/dL — ABNORMAL LOW (ref 3.5–5.0)
Alkaline Phosphatase: 161 U/L — ABNORMAL HIGH (ref 38–126)
Bilirubin, Direct: 0.6 mg/dL — ABNORMAL HIGH (ref 0.0–0.2)
Indirect Bilirubin: 1.1 mg/dL — ABNORMAL HIGH (ref 0.3–0.9)
Total Bilirubin: 1.7 mg/dL — ABNORMAL HIGH (ref 0.3–1.2)
Total Protein: 6.2 g/dL — ABNORMAL LOW (ref 6.5–8.1)

## 2018-02-21 LAB — CULTURE, BLOOD (ROUTINE X 2)
Culture: NO GROWTH
Special Requests: ADEQUATE

## 2018-02-21 LAB — CBC
HCT: 32.6 % — ABNORMAL LOW (ref 36.0–46.0)
Hemoglobin: 9.9 g/dL — ABNORMAL LOW (ref 12.0–15.0)
MCH: 29.2 pg (ref 26.0–34.0)
MCHC: 30.4 g/dL (ref 30.0–36.0)
MCV: 96.2 fL (ref 80.0–100.0)
Platelets: 331 10*3/uL (ref 150–400)
RBC: 3.39 MIL/uL — ABNORMAL LOW (ref 3.87–5.11)
RDW: 17.2 % — ABNORMAL HIGH (ref 11.5–15.5)
WBC: 14.8 10*3/uL — ABNORMAL HIGH (ref 4.0–10.5)
nRBC: 0 % (ref 0.0–0.2)

## 2018-02-21 MED ORDER — IPRATROPIUM-ALBUTEROL 0.5-2.5 (3) MG/3ML IN SOLN
3.0000 mL | Freq: Four times a day (QID) | RESPIRATORY_TRACT | Status: DC
  Administered 2018-02-21 – 2018-02-23 (×11): 3 mL via RESPIRATORY_TRACT
  Filled 2018-02-21 (×11): qty 3

## 2018-02-21 NOTE — Progress Notes (Signed)
Subjective:  UOP cont to inc- [age] yest-  had to have foley cath replaced due to retention. BUN and crt down again    Objective Vital signs in last 24 hours: Vitals:   02/21/18 0201 02/21/18 0450 02/21/18 0500 02/21/18 0851  BP:  (!) 150/85    Pulse:  83    Resp:  20    Temp:  98.9 F (37.2 C)    TempSrc:  Oral    SpO2: 96% 92%  95%  Weight:   106.9 kg   Height:       Weight change: -0.3 kg  Intake/Output Summary (Last 24 hours) at 02/21/2018 1135 Last data filed at 02/21/2018 0600 Gross per 24 hour  Intake 240 ml  Output 1750 ml  Net -1510 ml    Assessment/ Plan: Pt is a 85 y.o. yo female who was admitted on 02/16/2018 with AKI in the setting of volume depletion due to high output chole drain- thought to have malignancy in biliary tract    Assessment/Plan: 1. Renal - AKI- crt 0.87 on 1/8- then over 5.  U/A with moderate blood- RBC and WBC- really no protein- high spec gravity.  Seeming like ATN from volume depletion- imaging without hydro.  Continue with IV fluid repletion- changed to only NS, then stopped.  No indications for HD  - now really seeing trend for the better - I am optimistic for continued recovery I would think back to normal function 2. Hyponatremia- seeming like hypovolemic hyponatremia- giving NS  -- better - IVF stopped  3. Anemia-not an issue initially - likely hemoconcentrated- did dec again today (after biopsy) , cont to watch   4. HTN/volume- now I think tank is full- stopped  IVF.  Have stopped amlodipine and decreased bystolic as I do not want BP to get too low.   5. Hyperkalemia- resolved, stopped lokelma-  6. 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  She should not need renal specific follow up   [place]: Basic Metabolic Panel: Recent Labs  Lab 02/18/18 0444  02/19/18 0416 02/20/18 0434 02/21/18 0430  NA 130* 134* 136 137  K 3.6 3.1* 3.4* 3.8  CL 76* 81* 90* 96*  CO2 32 33* 30 28  GLUCOSE 134* 105* 107* 119*  BUN 102* 93* 78* 66*  CREATININE 5.79* 5.19* 4.55* 3.31*  CALCIUM 7.3* 6.7* 6.7* 7.3*  PHOS 9.7*  --  5.0* 3.0   Liver Function Tests: Recent Labs  Lab 02/19/18 0416 02/20/18 0434 02/21/18 0430  AST 56* 29 24  ALT 239* 156* 109*  ALKPHOS 182* 162* 161*  BILITOT 1.8* 1.8* 1.7*  PROT 5.9* 6.0* 6.2*  ALBUMIN 2.7* 2.7*  2.7* 2.5*  2.5*   No results for input(s): LIPASE, AMYLASE in the last 168 hours. No results for input(s): AMMONIA in the last 168 hours. CBC: Recent Labs  Lab 02/16/18 1820 02/17/18 0531 02/19/18 0416 02/20/18 0434 02/21/18 0430  WBC 28.3* 23.3* 11.8* 14.5* 14.8*  NEUTROABS 26.0*  --   --   --   --   HGB 13.9 13.0 10.0* 9.8* 9.9*  HCT 44.3 41.2 31.8* 31.9* 32.6*  MCV 91.3 90.9 90.9 92.5 96.2  PLT 570* 478* 320 358 331   Cardiac Enzymes: No results for input(s): CKTOTAL, CKMB, CKMBINDEX, TROPONINI in the last 168 hours. CBG:  No results for input(s): GLUCAP in the last 168 hours.  Iron Studies: No results for input(s): IRON, TIBC, TRANSFERRIN, FERRITIN in the last 72 hours. Studies/Results: No results found. Medications: Infusions: . sodium chloride      Scheduled Medications: . ipratropium-albuterol  3 mL Nebulization Q6H  . mouth rinse  15 mL Mouth Rinse BID  . mometasone-formoterol  2 puff Inhalation BID  . nebivolol  10 mg Oral Daily  . pantoprazole  40 mg Oral Daily  . potassium chloride  40 mEq Oral Daily  . rivaroxaban  15 mg Oral Q supper  . traMADol  50 mg Oral BID    have reviewed scheduled and prn medications.  Physical Exam: General: less confused than yest- "just cant get comfortable"  Heart: RRR Lungs: mostly clear Abdomen: distended- drain in place but no drainage bag Extremities: min  peripheral edema    02/21/2018,11:35 AM  LOS: 5 days

## 2018-02-21 NOTE — Plan of Care (Signed)
Patient tolerating renal diet, no c/o nausea, vomiting, abdominal pain this shift.  Patient up to chair with 2 assist, grandson at bedside.

## 2018-02-21 NOTE — Progress Notes (Signed)
[facility] PROGRESS NOTE  Jordan Byrd MRN:[id number] DOB: 09/10/1933 DOA: 02/16/2018 PCP: Green, Edwin, MD   Narrative:  85-year-old female Known history of chronic A. fib chads score >3 on Xarelto HTN Prior stage II breast cancer status post lumpectomy 2015 HFprEF EF 55-60% 07/13/2012 Stage III CKD creatinine last admission 1.09 Morbid obesity BMI 39 COPD on oxygen 2 L baseline  Recent admit 12/30-1/10 for obstructive painless jaundice and liver mass with concern for cholangiocarcinoma-at that admission she was found to have left lower lobe lesion IR evaluated after GI saw had 10 French internal/external drain to right liver lobe-CA-19-9 7571 Ca1 2567 CEA 4-she had a nonfunctioning drain on 1/5 associated with severe pain and had replacement of drain 1/7 no further findings-it was felt despite negative brushings that likely cholangiocarcinoma and less likely hepatocellular--- Dr. Shadad consulted + felt more palliative XRT chemo  She returns with adult failure to thrive nausea vomiting and a creatinine of 5 in the setting of these issues she has not been eating and drinking her discharge creatinine was 37/0.8 In addition she had another CT showing ill-defined hepatic lesion and significant dilation of the left hepatic ductal system and hence IR was consulted    A & Plan  Nausea vomiting malaise resolved-eating and drinking Acute kidney injury with hyperkalemia on admission Likely precipitated by BP meds ARB etc.  wa son lokelma and then developed hypokalemia-improved-Saline d/c Urinary retention Continue foley as failed void trial and will need OP Urology input on d/c Hypoosmolar hyponatremia Urine osmolality was 353 sodium 11-continue IV saline at this time  Sodium is improved Atrial fibrillation chads score >3 Continue Bystolic 10 daily,resumed Xarelto Cholangiocarcinoma with right hepatic drain Had drain replaced 1/7 and 12 French internal/external biliary drain -it  has consistently been putting output 6-700 cc and was internalized 1/16 Will contact Dr. Byerly of Surgery when she is available for a hepatic surgery opinion Needs OP discussion Dr Shadad options for overall cancer care HFprEF Stable at this time-holding diuretics/ARB HTN Amlodipine d/w 1/17, nebivolol 20-->10 1/17 per neprho losartan DC completely Right finger dislocation X-rays not reveal acute fracture--removed finger splint Pulmonary nodules Warrants outpatient monitoring PET scan to be scheduled   DVT therapeutic heparin code Status: DNR communication: discussed with grandson on phone disposition Plan: creatinine continues to improve-will need Skilled placement in the next 48-72 hours once creat closer to [place]   , MD  [facility] Via amion app OR [email or web address] 7PM-7AM contact night coverage as above 02/21/2018, 1:48 PM  LOS: 5 days   Consultants:  IR  Renal  Procedures:    Antimicrobials:  None  Interval history/Subjective:  Sleepy this am No other new issue Continues to produce good urine No cp   Objective:  Vitals:  Vitals:   02/21/18 0450 02/21/18 0851  BP: (!) 150/85   Pulse: 83   Resp: 20   Temp: 98.9 F (37.2 C)   SpO2: 92% 95%    Exam: No overt change today othe rthan sleepy  EOMI NCAT Chest slight wheeze no rales Abdomen soft --drain covered No lower extremity edema No abdominal discomfort Moving all 4 limbs equally no new focal neurological deficit  I have personally reviewed the following:  DATA   Labs:  BUN/creatinine has dropped from 102/5.7--->78/4.55-->66/3.3  Alk phos 161 down from 280  Bili 1.8-->1.1   Scheduled Meds: . ipratropium-albuterol  3 mL Nebulization Q6H  . mouth rinse  15 mL Mouth Rinse BID  . mometasone-formoterol  2 puff   Inhalation BID  . nebivolol  10 mg Oral Daily  . pantoprazole  40 mg Oral Daily  . potassium chloride  40 mEq Oral Daily  . rivaroxaban  15 mg Oral Q supper  .  traMADol  50 mg Oral BID   Continuous Infusions: . sodium chloride      Principal Problem:   ARF (acute renal failure) (HCC) Active Problems:   Paroxysmal atrial fibrillation (HCC)   Chronic diastolic heart failure (HCC)   COPD mixed type (HCC)   Obstructive jaundice   Atrial fibrillation (HCC)   Hyperkalemia   LOS: 5 days          

## 2018-02-22 LAB — RENAL FUNCTION PANEL
Albumin: 2.5 g/dL — ABNORMAL LOW (ref 3.5–5.0)
Anion gap: 12 (ref 5–15)
BUN: 52 mg/dL — ABNORMAL HIGH (ref 8–23)
CALCIUM: 7.7 mg/dL — AB (ref 8.9–10.3)
CO2: 27 mmol/L (ref 22–32)
Chloride: 98 mmol/L (ref 98–111)
Creatinine, Ser: 2.39 mg/dL — ABNORMAL HIGH (ref 0.44–1.00)
GFR calc Af Amer: 21 mL/min — ABNORMAL LOW (ref >60)
GFR calc non Af Amer: 18 mL/min — ABNORMAL LOW (ref >60)
Glucose, Bld: 110 mg/dL — ABNORMAL HIGH (ref 70–99)
Phosphorus: 2.3 mg/dL — ABNORMAL LOW (ref 2.5–4.6)
Potassium: 4.2 mmol/L (ref 3.5–5.1)
SODIUM: 137 mmol/L (ref 135–145)

## 2018-02-22 LAB — CULTURE, BLOOD (ROUTINE X 2)
Culture: NO GROWTH
Special Requests: ADEQUATE

## 2018-02-22 MED ORDER — ZOLPIDEM TARTRATE 5 MG PO TABS
5.0000 mg | ORAL_TABLET | Freq: Once | ORAL | Status: AC
  Administered 2018-02-22: 5 mg via ORAL
  Filled 2018-02-22: qty 1

## 2018-02-22 NOTE — Progress Notes (Signed)
[facility] PROGRESS NOTE  Jordan Byrd SNK:[id number] DOB: [birth year] DOA: 02/16/2018 PCP: Levin Erp, MD   Narrative:  85 year old female Known history of chronic A. fib chads score >3 on Xarelto HTN Prior stage II breast cancer status post lumpectomy 2015 HFprEF EF 55-60% 07/13/2012 Stage III CKD creatinine last admission 1.09 Morbid obesity BMI 39 COPD on oxygen 2 L baseline  Recent admit 12/30-1/10 for obstructive painless jaundice and liver mass with concern for cholangiocarcinoma-at that admission she was found to have left lower lobe lesion IR evaluated after GI saw had 10 French internal/external drain to right liver lobe-CA-19-9 7571 Ca1 2567 CEA 4-she had a nonfunctioning drain on 1/5 associated with severe pain and had replacement of drain 1/7 no further findings-it was felt despite negative brushings that likely cholangiocarcinoma and less likely hepatocellular--- Dr. Alen Blew consulted + felt more palliative XRT chemo  She returns with adult failure to thrive nausea vomiting and a creatinine of 5 in the setting of these issues she has not been eating and drinking her discharge creatinine was 37/0.8 In addition she had another CT showing ill-defined hepatic lesion and significant dilation of the left hepatic ductal system and hence IR was consulted    A & Plan  Nausea vomiting malaise resolved-eating and drinking Acute kidney injury with hyperkalemia on admission Likely precipitated by BP meds ARB etc.  waso n lokelma and then developed hypokalemia -improved-Saline d/c Urinary retention Continue foley as failed void trial and will need OP Urology input on d/c Hypoosmolar hyponatremia Has been saline locked for the past few days Sodium is improved Atrial fibrillation chads score >3 Continue Bystolic 10 daily,resumed Xarelto Cholangiocarcinoma with right hepatic drain Had drain replaced 1/7 and 12 French internal/external biliary drain -it has consistently been  putting output 6-700 cc and was internalized 1/16 Needs OP discussion Dr Alen Blew options for overall cancer care HFprEF Stable at this time-holding diuretics/ARB HTN Amlodipine d/w 1/17, nebivolol 20-->10 1/17 per neprho losartan DC completely Right finger dislocation X-rays not reveal acute fracture--removed finger splint Pulmonary nodules Warrants outpatient monitoring PET scan to be scheduled   DVT therapeutic heparin code Status: DNR communication: Called and discussed plan of care disposition Plan: Creatinine continues to steadily improve and patient is nearing hemodynamic stability and probable discharge in the next 24 to 48 hours  Jordan Larcom, MD  [facility] Via [employer] app OR [email or web address] 7PM-7AM contact night coverage as above 02/22/2018, 11:29 AM  LOS: 6 days   Consultants:  IR  Renal  Procedures:    Antimicrobials:  None  Interval history/Subjective:  Sleepy and has not slept well in the past 2 days Eating drinking No nausea although some abdominal pain  Objective:  Vitals:  Vitals:   02/22/18 0506 02/22/18 0843  BP: (!) 146/58   Pulse: 84   Resp: 20   Temp: 98 F (36.7 C)   SpO2: 95% 91%    Exam:  Sleepy but arousable no distress tolerating diet Some abdominal pain EOMI NCAT no distress Chest is clear without added sounds abdomen tender in epigastrium Drain is internalized No lower extremity edema  I have personally reviewed the following:  DATA   Labs:  BUN/creatinine has dropped from 102/5.7--->78/4.55-->66/3.3-->52/2.3  Scheduled Meds: . ipratropium-albuterol  3 mL Nebulization Q6H  . mouth rinse  15 mL Mouth Rinse BID  . mometasone-formoterol  2 puff Inhalation BID  . nebivolol  10 mg Oral Daily  . pantoprazole  40 mg Oral Daily  . potassium chloride  40  mEq Oral Daily  . rivaroxaban  15 mg Oral Q supper  . traMADol  50 mg Oral BID   Continuous Infusions: . sodium chloride      Principal Problem:   ARF (acute  renal failure) (HCC) Active Problems:   Paroxysmal atrial fibrillation (HCC)   Chronic diastolic heart failure (HCC)   COPD mixed type (HCC)   Obstructive jaundice   Atrial fibrillation (HCC)   Hyperkalemia   LOS: 6 days

## 2018-02-22 NOTE — Care Management Important Message (Signed)
Important Message  Patient Details  Name: Jordan Byrd MRN: [id number] Date of Birth: [birth year]   Medicare Important Message Given:  Yes    Kerin Salen 02/22/2018, 12:55 [place] Message  Patient Details  Name: Jordan Byrd MRN: [id number] Date of Birth: 1933/12/28   Medicare Important Message Given:  Yes    Kerin Salen 02/22/2018, 12:55 PM

## 2018-02-22 NOTE — Progress Notes (Signed)
[place] for Xarelto Indication: atrial fibrillation  Allergies  Allergen Reactions  . Methocarbamol Hives  . Vicodin [Hydrocodone-Acetaminophen] Other (See Comments)    Interacts with bp medication and drops blood pressure  . Aspirin Nausea And Vomiting  . Butazolidin [Phenylbutazone] Other (See Comments)    Unknown reaction  . Celebrex [Celecoxib] Nausea And Vomiting  . Codeine Nausea And Vomiting  . Doripenem Rash  . Aspirin Buf(Alhyd-Mghyd-Cacar) Nausea And Vomiting  . Mucinex [Guaifenesin Er] Itching  . Temazepam Other (See Comments)    Unknown reaction    Patient Measurements: Height: 5\' 6"  (167.6 cm) Weight: 235 lb 14.3 oz (107 kg) IBW/kg (Calculated) : 59.3 Heparin Dosing Weight: 82.5 kg  Vital Signs: Temp: 98 F (36.7 C) (01/20 0506) Temp Source: Oral (01/20 0506) BP: 146/58 (01/20 0506) Pulse Rate: 84 (01/20 0506)  Labs: Recent Labs    02/20/18 0434 02/21/18 0430 02/22/18 0431  HGB 9.8* 9.9*  --   HCT 31.9* 32.6*  --   PLT 358 331  --   CREATININE 4.55* 3.31* 2.39*    Estimated Creatinine Clearance: 22.5 mL/min (A) (by C-G formula based on SCr of 2.39 mg/dL (H)).   Medical History: Past Medical History:  Diagnosis Date  . Anemia   . Arthritis    "all over" (02/01/2018)  . Asthma   . Atrial fibrillation ([place])    on Xarelto  . Breast cancer, left breast ([place]) 05/19/13   left breast stage IIA   . CHF (congestive heart failure) ([place])   . COPD (chronic obstructive pulmonary disease) ([place])   . GERD (gastroesophageal reflux disease)    [name] Prilosec  . Hypertension   . Macular degeneration   . Obstructive jaundice 02/01/2018  . On home oxygen therapy    "2.5L prn" (02/01/2018)  . Osteoarthritis    a. 03/28/11 - R Total Hip Arthroplasty  . Pleurisy   . Pneumonia    "2-3 times" (02/01/2018)    Medications:  Xarelto 20 mg daily PTA > Lovenox 1 mg/kg sq daily > heparin infusion (last dose of Xarelto  1/13)  Assessment: 85 y/o F with a h/o chronic atrial fibrillation on Xarelto PTA and recent finding of hepatic mass s/o IR drain admitted with ARF due to high-output bili drain. Patient was placed on Lovenox bridge therapy due to AKI. Plan to transition to UFH as a safer option in the setting of ARF with SCR 5.79. Patient received a dose of Lovenox 1 mg/kg 1/16 @ 1030.    Today, 02/22/18  Renal function continues to improve - est CrCl now 23 ml/min (SCr 2.39)  CBC stable on 1/19  No reported bleeding per patient  Xarelto 15mg  daily being charted as ordered  Plan: Continue Xarelto 15mg  PO daily with food (for CrCl 15-30ml/min) Monitor for signs/symptoms of bleeding and renal function  Kara Mead 02/22/2018,10:12 AM

## 2018-02-22 NOTE — Progress Notes (Signed)
Discussed disposition plans with pt's grandson and initiated insurance authorization request for return to SNF [facility]).  Sharren Bridge, MSW, LCSW Clinical Social Work 02/22/2018 [phone]

## 2018-02-22 NOTE — Consult Note (Signed)
   [facility] CM Inpatient Consult   02/22/2018  HAE AHLERS [birth year] [id number]    Went to bedside to speak with Mrs, Larock. She was resting. States her plan her discharge plan is for SNF.   Will continue to follow for disposition plans and progression.   Will update [facility] RN Post Acute Care Coordinator.   Marthenia Rolling, MSN-Ed, RN,BSN [facility] Liaison [phone]

## 2018-02-22 NOTE — NC FL2 (Signed)
[place] LEVEL OF CARE SCREENING TOOL     IDENTIFICATION  Patient Name: Jordan Byrd Birthdate: 06-08-1933 Sex: female Admission Date (Current Location): 02/16/2018  [facility] and [place] Number:  [place] and Address:  [facility],  [place] [address] [place]      Provider Number: [id number]  Attending Physician Name and Address:  Nita Sells, MD  Relative Name and Phone Number:  [name] [phone]; Lowe,Cleo-Daughter - [phone]      Current Level of Care: Hospital Recommended Level of Care: [place] Prior Approval Number:    Date Approved/Denied:   PASRR Number: [id number]  Discharge Plan: SNF    Current Diagnoses: Patient Active Problem List   Diagnosis Date Noted  . ARF (acute renal failure) ([place]) 02/16/2018  . Hyperkalemia 02/16/2018  . Obstructive jaundice 02/01/2018  . Falls frequently 02/01/2018  . CHF (congestive heart failure) ([place])   . Atrial fibrillation ([place])   . Shoulder pain, left   . COPD exacerbation ([place]) 02/09/2015  . Chronic respiratory failure with hypoxia ([place]) 06/15/2014  . Acute respiratory failure with hypoxia ([place]) 05/24/2014  . Influenza A H1N1 infection 05/14/2014  . COPD with exacerbation ([place]) 05/13/2014  . Sleep-disordered breathing 03/11/2014  . Malignant neoplasm of breast (female), unspecified site 07/06/2013  . Family history of malignant neoplasm of breast 07/06/2013  . Breast cancer of upper-outer quadrant of left female breast ([place]) 05/20/2013  . Anemia 03/07/2013  . COPD mixed type ([place]) 10/28/2012  . Chest tightness 10/28/2012  . HCAP (healthcare-associated pneumonia) 09/19/2012  . Chronic anticoagulation 09/19/2012  . Acute on chronic diastolic heart failure ([place]) 07/12/2012  . Dizziness 06/26/2012  . CKD (chronic kidney disease) stage 3, GFR 30-59 ml/min (HCC) 06/26/2012  . Chronic diastolic heart failure ([place]) 06/26/2012  .  AKI (acute kidney injury) ([place]) 06/17/2012  . Acute respiratory distress 06/14/2012  . Acute asthma exacerbation 06/14/2012  . UTI (lower urinary tract infection) 04/04/2011  . Arthritis   . Anxiety   . Hypertension   . Paroxysmal atrial fibrillation (HCC)     Orientation RESPIRATION BLADDER Height & Weight     Self, Time, Situation, Place  Normal Incontinent, Indwelling catheter Weight: 235 lb 14.3 oz (107 kg) Height:  5\' 6"  (167.6 cm)  BEHAVIORAL SYMPTOMS/MOOD NEUROLOGICAL BOWEL NUTRITION STATUS      Incontinent Diet(renal diet- 1200 mL fluid restriction)  AMBULATORY STATUS COMMUNICATION OF NEEDS Skin   Extensive Assist Verbally Normal                       Personal Care Assistance Level of Assistance  Bathing, Feeding, Dressing Bathing Assistance: Maximum assistance Feeding assistance: Limited assistance Dressing Assistance: Maximum assistance     Functional Limitations Info  Sight, Hearing, Speech Sight Info: Impaired Hearing Info: Adequate Speech Info: Adequate    SPECIAL CARE FACTORS FREQUENCY  PT (By licensed PT), OT (By licensed OT)     PT Frequency: 5x OT Frequency: 5x            Contractures Contractures Info: Not present    Additional Factors Info  Code Status, Allergies Code Status Info: DNR Allergies Info: Methocarbamol, Vicodin Hydrocodone-acetaminophen, Aspirin, Butazolidin Phenylbutazone, Celebrex Celecoxib, Codeine, Doripenem, Aspirin Buf(alhyd-mghyd-cacar), Mucinex Guaifenesin Er, Temazepam           Current Medications (02/22/2018):  This is the current hospital active medication list Current Facility-Administered Medications  Medication Dose Route Frequency Provider Last Rate Last Dose  .  acetaminophen (TYLENOL) tablet 650 mg  650 mg Oral Q6H PRN Rise Patience, MD       Or  . acetaminophen (TYLENOL) suppository 650 mg  650 mg Rectal Q6H PRN Rise Patience, MD      . albuterol (PROVENTIL) (2.5 MG/3ML) 0.083% nebulizer  solution 2.5 mg  2.5 mg Nebulization Q6H PRN Rise Patience, MD   2.5 mg at 02/20/18 2013  . diphenhydrAMINE (BENADRYL) capsule 25 mg  25 mg Oral Q8H PRN Lovey Newcomer T, NP   25 mg at 02/19/18 2321  . ipratropium-albuterol (DUONEB) 0.5-2.5 (3) MG/3ML nebulizer solution 3 mL  3 mL Nebulization Q6H Nita Sells, MD   3 mL at 02/22/18 0843  . MEDLINE mouth rinse  15 mL Mouth Rinse BID Rise Patience, MD   15 mL at 02/21/18 1015  . mometasone-formoterol (DULERA) 200-5 MCG/ACT inhaler 2 puff  2 puff Inhalation BID Rise Patience, MD   2 puff at 02/22/18 0843  . nebivolol (BYSTOLIC) tablet 10 mg  10 mg Oral Daily Corliss Parish, MD   10 mg at 02/21/18 1013  . nitroGLYCERIN (NITROSTAT) SL tablet 0.4 mg  0.4 mg Sublingual Q5 min PRN Rise Patience, MD      . ondansetron [facility]) tablet 4 mg  4 mg Oral Q6H PRN Rise Patience, MD       Or  . ondansetron [facility]) injection 4 mg  4 mg Intravenous Q6H PRN Rise Patience, MD   4 mg at 02/18/18 1032  . pantoprazole (PROTONIX) EC tablet 40 mg  40 mg Oral Daily Rise Patience, MD   40 mg at 02/21/18 1013  . polyethylene glycol (MIRALAX / GLYCOLAX) packet 17 g  17 g Oral Daily PRN Nita Sells, MD      . potassium chloride SA (K-DUR,KLOR-CON) CR tablet 40 mEq  40 mEq Oral Daily Nita Sells, MD   40 mEq at 02/21/18 1013  . Rivaroxaban (XARELTO) tablet 15 mg  15 mg Oral Q supper Thomes Lolling, RPH   15 mg at 02/21/18 1807  . senna-docusate (Senokot-S) tablet 1 tablet  1 tablet Oral QHS PRN Nita Sells, MD      . sodium chloride 0.9 % bolus 1,000 mL  1,000 mL Intravenous Once Corliss Parish, MD      . traMADol Veatrice Bourbon) tablet 50 mg  50 mg Oral BID Rise Patience, MD   50 mg at 02/17/18 9242     Discharge Medications: Please see discharge summary for a list of discharge medications.  Relevant Imaging Results:  Relevant Lab Results:   Additional  Information [id number]. right hepatic drain  Nila Nephew, LCSW

## 2018-02-22 NOTE — Progress Notes (Signed)
Pt c/o diffuse abdominal pain across upper abdomen with movement. Describes pain as a "catch" in side. Repositioned and drain bag placed on biliary drain. Yellow/green drainage noted. K pad ordered and placed on upper abdomen/side. Pt states that pain is better at this time. Spoke with Radiology PA regarding drainage bag, gave okay to keep drainage bag in place at this time. Will continue to monitor.

## 2018-02-23 DIAGNOSIS — J9611 Chronic respiratory failure with hypoxia: Secondary | ICD-10-CM | POA: Diagnosis not present

## 2018-02-23 DIAGNOSIS — J9691 Respiratory failure, unspecified with hypoxia: Secondary | ICD-10-CM | POA: Diagnosis not present

## 2018-02-23 DIAGNOSIS — R110 Nausea: Secondary | ICD-10-CM | POA: Diagnosis not present

## 2018-02-23 DIAGNOSIS — R2689 Other abnormalities of gait and mobility: Secondary | ICD-10-CM | POA: Diagnosis not present

## 2018-02-23 DIAGNOSIS — K831 Obstruction of bile duct: Secondary | ICD-10-CM | POA: Diagnosis not present

## 2018-02-23 DIAGNOSIS — M2550 Pain in unspecified joint: Secondary | ICD-10-CM | POA: Diagnosis not present

## 2018-02-23 DIAGNOSIS — R2681 Unsteadiness on feet: Secondary | ICD-10-CM | POA: Diagnosis not present

## 2018-02-23 DIAGNOSIS — R112 Nausea with vomiting, unspecified: Secondary | ICD-10-CM | POA: Diagnosis not present

## 2018-02-23 DIAGNOSIS — J449 Chronic obstructive pulmonary disease, unspecified: Secondary | ICD-10-CM | POA: Diagnosis not present

## 2018-02-23 DIAGNOSIS — Z7401 Bed confinement status: Secondary | ICD-10-CM | POA: Diagnosis not present

## 2018-02-23 DIAGNOSIS — R41841 Cognitive communication deficit: Secondary | ICD-10-CM | POA: Diagnosis not present

## 2018-02-23 DIAGNOSIS — R918 Other nonspecific abnormal finding of lung field: Secondary | ICD-10-CM | POA: Diagnosis not present

## 2018-02-23 DIAGNOSIS — R278 Other lack of coordination: Secondary | ICD-10-CM | POA: Diagnosis not present

## 2018-02-23 DIAGNOSIS — I129 Hypertensive chronic kidney disease with stage 1 through stage 4 chronic kidney disease, or unspecified chronic kidney disease: Secondary | ICD-10-CM | POA: Diagnosis not present

## 2018-02-23 DIAGNOSIS — N179 Acute kidney failure, unspecified: Secondary | ICD-10-CM | POA: Diagnosis not present

## 2018-02-23 DIAGNOSIS — R531 Weakness: Secondary | ICD-10-CM | POA: Diagnosis not present

## 2018-02-23 DIAGNOSIS — J438 Other emphysema: Secondary | ICD-10-CM | POA: Diagnosis not present

## 2018-02-23 DIAGNOSIS — I10 Essential (primary) hypertension: Secondary | ICD-10-CM | POA: Diagnosis not present

## 2018-02-23 DIAGNOSIS — K5900 Constipation, unspecified: Secondary | ICD-10-CM | POA: Diagnosis not present

## 2018-02-23 DIAGNOSIS — C253 Malignant neoplasm of pancreatic duct: Secondary | ICD-10-CM | POA: Diagnosis not present

## 2018-02-23 DIAGNOSIS — M6281 Muscle weakness (generalized): Secondary | ICD-10-CM | POA: Diagnosis not present

## 2018-02-23 DIAGNOSIS — C221 Intrahepatic bile duct carcinoma: Secondary | ICD-10-CM | POA: Diagnosis not present

## 2018-02-23 DIAGNOSIS — N19 Unspecified kidney failure: Secondary | ICD-10-CM | POA: Diagnosis not present

## 2018-02-23 DIAGNOSIS — I480 Paroxysmal atrial fibrillation: Secondary | ICD-10-CM | POA: Diagnosis not present

## 2018-02-23 DIAGNOSIS — I5031 Acute diastolic (congestive) heart failure: Secondary | ICD-10-CM | POA: Diagnosis not present

## 2018-02-23 DIAGNOSIS — R0602 Shortness of breath: Secondary | ICD-10-CM | POA: Diagnosis not present

## 2018-02-23 DIAGNOSIS — I5032 Chronic diastolic (congestive) heart failure: Secondary | ICD-10-CM | POA: Diagnosis not present

## 2018-02-23 DIAGNOSIS — R17 Unspecified jaundice: Secondary | ICD-10-CM | POA: Diagnosis not present

## 2018-02-23 LAB — RENAL FUNCTION PANEL
Albumin: 2.4 g/dL — ABNORMAL LOW (ref 3.5–5.0)
Anion gap: 11 (ref 5–15)
BUN: 47 mg/dL — ABNORMAL HIGH (ref 8–23)
CO2: 25 mmol/L (ref 22–32)
Calcium: 7.6 mg/dL — ABNORMAL LOW (ref 8.9–10.3)
Chloride: 98 mmol/L (ref 98–111)
Creatinine, Ser: 2.2 mg/dL — ABNORMAL HIGH (ref 0.44–1.00)
GFR calc Af Amer: 23 mL/min — ABNORMAL LOW (ref >60)
GFR calc non Af Amer: 20 mL/min — ABNORMAL LOW (ref >60)
Glucose, Bld: 117 mg/dL — ABNORMAL HIGH (ref 70–99)
Phosphorus: 2 mg/dL — ABNORMAL LOW (ref 2.5–4.6)
Potassium: 4.1 mmol/L (ref 3.5–5.1)
Sodium: 134 mmol/L — ABNORMAL LOW (ref 135–145)

## 2018-02-23 LAB — HEPATIC FUNCTION PANEL
ALT: 56 U/L — ABNORMAL HIGH (ref 0–44)
AST: 14 U/L — ABNORMAL LOW (ref 15–41)
Albumin: 2.4 g/dL — ABNORMAL LOW (ref 3.5–5.0)
Alkaline Phosphatase: 122 U/L (ref 38–126)
Bilirubin, Direct: 0.5 mg/dL — ABNORMAL HIGH (ref 0.0–0.2)
Indirect Bilirubin: 0.8 mg/dL (ref 0.3–0.9)
Total Bilirubin: 1.3 mg/dL — ABNORMAL HIGH (ref 0.3–1.2)
Total Protein: 6.1 g/dL — ABNORMAL LOW (ref 6.5–8.1)

## 2018-02-23 LAB — CBC WITH DIFFERENTIAL/PLATELET
Abs Immature Granulocytes: 0.11 10*3/uL — ABNORMAL HIGH (ref 0.00–0.07)
Basophils Absolute: 0 10*3/uL (ref 0.0–0.1)
Basophils Relative: 0 %
EOS PCT: 2 %
Eosinophils Absolute: 0.4 10*3/uL (ref 0.0–0.5)
HCT: 29.7 % — ABNORMAL LOW (ref 36.0–46.0)
Hemoglobin: 8.8 g/dL — ABNORMAL LOW (ref 12.0–15.0)
Immature Granulocytes: 1 %
Lymphocytes Relative: 6 %
Lymphs Abs: 1 10*3/uL (ref 0.7–4.0)
MCH: 28.2 pg (ref 26.0–34.0)
MCHC: 29.6 g/dL — AB (ref 30.0–36.0)
MCV: 95.2 fL (ref 80.0–100.0)
Monocytes Absolute: 1 10*3/uL (ref 0.1–1.0)
Monocytes Relative: 7 %
NRBC: 0 % (ref 0.0–0.2)
Neutro Abs: 12.9 10*3/uL — ABNORMAL HIGH (ref 1.7–7.7)
Neutrophils Relative %: 84 %
Platelets: 333 10*3/uL (ref 150–400)
RBC: 3.12 MIL/uL — ABNORMAL LOW (ref 3.87–5.11)
RDW: 17.4 % — ABNORMAL HIGH (ref 11.5–15.5)
WBC: 15.5 10*3/uL — ABNORMAL HIGH (ref 4.0–10.5)

## 2018-02-23 MED ORDER — POTASSIUM CHLORIDE CRYS ER 20 MEQ PO TBCR: 40.0000 meq | EXTENDED_RELEASE_TABLET | Freq: Every day | ORAL | Status: AC

## 2018-02-23 MED ORDER — NEBIVOLOL HCL 10 MG PO TABS: 10.0000 mg | ORAL_TABLET | Freq: Every day | ORAL | Status: AC

## 2018-02-23 MED ORDER — TRAMADOL HCL 50 MG PO TABS: 50.0000 mg | ORAL_TABLET | Freq: Two times a day (BID) | ORAL | 0 refills | Status: AC

## 2018-02-23 NOTE — Discharge Summary (Signed)
Physician Discharge Summary  Jordan Byrd GBE:[id number] DOB: 08-Dec-1933 DOA: 02/16/2018  PCP: Levin Erp, MD  Admit date: 02/16/2018 Discharge date: 02/23/2018  Time spent: 35 minutes  Recommendations for Outpatient Follow-up:  1. Keep externalized L biliary drain--Keep foley on d/c home--do not aspirated the tube 2. Will require indwelling Foley for the time being and SNF MD coordination of follow-up with urologist for persisting indwelling Foley 3. Needs Palliative or Hospice to visit facility where she is discharged to and coordinate end of life discussions 4. Needs chem 12 cbc 1 week  5. Suggest temporary IV fluids at facility if seems to have acute kidney injury short-term until discussions can be had if she fails to thrive  Discharge Diagnoses:  Principal Problem:   ARF (acute renal failure) (HCC) Active Problems:   Paroxysmal atrial fibrillation (HCC)   Chronic diastolic heart failure (HCC)   COPD mixed type (HCC)   Obstructive jaundice   Atrial fibrillation (HCC)   Hyperkalemia   Discharge Condition: gaurded  Diet recommendation: liberalize  Filed Weights   02/21/18 0500 02/22/18 0500 02/23/18 0442  Weight: 106.9 kg 107 kg 106.1 kg    History of present illness:  85 year old female Known history of chronic A. fib chads score >3 on Xarelto HTN Prior stage II breast cancer status post lumpectomy 2015 HFprEF EF 55-60% 07/13/2012 Stage III CKD creatinine last admission 1.09 Morbid obesity BMI 39 COPD on oxygen 2 L baseline  Recent admit 12/30-1/10 for obstructive painless jaundice and liver mass with concern for cholangiocarcinoma-at that admission she was found to have left lower lobe lesion IR evaluated after GI saw had 10 French internal/external drain to right liver lobe-[id number] Ca1 2567 CEA 4-she had a nonfunctioning drain on 1/5 associated with severe pain and had replacement of drain 1/7 no further findings-it was felt despite negative brushings that  likely cholangiocarcinoma and less likely hepatocellular---Dr. Alen Blew consulted + felt more palliative XRT chemo  She returns with adult failure to thrive nausea vomiting and a creatinine of 5 in the setting of these issues she has not been eating and drinking her discharge creatinine was 37/0.8 In addition she had another CT showing ill-defined hepatic lesion and significant dilation of the left hepatic ductalsystem and hence IR was consulted   Hospital Course:  Nausea vomiting malaise resolved-eating and drinking Acute kidney injury with hyperkalemia on admission Likely precipitated by BP meds ARB etc.  was on lokelma and then developed hypokalemia -improved-Saline d/c Creatinine 2.2 on discharge will need rechecks Urinary retention Continue foley as failed void trial and will need OP Urology input on d/c Will need Chem-12 in the outpatient setting Hypoosmolar hyponatremia Has been saline locked for the past few days Sodium is improved Atrial fibrillation chads score >3 Continue Bystolic 10 daily,resumed Xarelto Cholangiocarcinoma with right hepatic drain Had drain replaced 1/7 and 12 French internal/external biliary drain -it has consistently been putting output 6-700 cc and was internalized 1/16 but externalized prior to d/c to facility Will need close monitoring HFprEF Stable at this time-holding diuretics/ARB HTN Amlodipine d/w 1/17, nebivolol 20-->10 1/17 per neprho losartan DC completely Right finger dislocation X-rays not reveal acute fracture--removed finger splint Pulmonary nodules Warrants outpatient monitoring PET scan to be    Consultations:  Interventional radiology  Oncology  Renal  Discharge Exam: Vitals:   02/23/18 0437 02/23/18 0906  BP: (!) 145/54   Pulse: 90   Resp: 20   Temp: 99 F (37.2 C)   SpO2: 99% 95%  General: Awake obese pleasant no distress tolerating some diet on oxygen Cardiovascular: S1-S2 no murmur rub or gallop no rales  no rhonchi Respiratory: Clinically clear no added sound Abdomen soft bag externalized in right quadrant coming from liver Abdomen soft Foley in place  Discharge Instructions   Discharge Instructions    Diet - low sodium heart healthy   Complete by:  As directed    Diet - low sodium heart healthy   Complete by:  As directed    Increase activity slowly   Complete by:  As directed    Increase activity slowly   Complete by:  As directed      Allergies as of 02/23/2018      Reactions   Methocarbamol Hives   Vicodin [hydrocodone-acetaminophen] Other (See Comments)   Interacts with bp medication and drops blood pressure   Aspirin Nausea And Vomiting   Butazolidin [phenylbutazone] Other (See Comments)   Unknown reaction   Celebrex [celecoxib] Nausea And Vomiting   Codeine Nausea And Vomiting   Doripenem Rash   Aspirin Buf(alhyd-mghyd-cacar) Nausea And Vomiting   Mucinex [guaifenesin Er] Itching   Temazepam Other (See Comments)   Unknown reaction      Medication List    STOP taking these medications   amLODipine 10 MG tablet Commonly known as:  NORVASC   feeding supplement (ENSURE ENLIVE) Liqd     TAKE these medications   albuterol 108 (90 Base) MCG/ACT inhaler Commonly known as:  PROVENTIL HFA;VENTOLIN HFA Inhale 1 puff into the lungs every 6 (six) hours as needed for wheezing. Reported on 04/10/2015   budesonide-formoterol 160-4.5 MCG/ACT inhaler Commonly known as:  SYMBICORT Inhale 2 puffs into the lungs See admin instructions. Inhale 1 puff every morning, may inhale a 2nd puff in the evening if needed for wheezing   multivitamin with minerals Tabs tablet Take 1 tablet by mouth daily.   nebivolol 10 MG tablet Commonly known as:  BYSTOLIC Take 1 tablet (10 mg total) by mouth daily. What changed:    medication strength  how much to take   nitroGLYCERIN 0.4 MG SL tablet Commonly known as:  NITROSTAT Place 0.4 mg under the tongue every 5 (five) minutes as  needed for chest pain.   omeprazole 20 MG capsule Commonly known as:  PRILOSEC Take 20 mg by mouth daily.   ondansetron 4 MG tablet Commonly known as:  ZOFRAN Take 1 tablet (4 mg total) by mouth every 8 (eight) hours as needed for nausea or vomiting.   polyethylene glycol packet Commonly known as:  MIRALAX / GLYCOLAX Take 17 g by mouth 2 (two) times daily.   potassium chloride SA 20 MEQ tablet Commonly known as:  K-DUR,KLOR-CON Take 2 tablets (40 mEq total) by mouth daily.   rivaroxaban 20 MG Tabs tablet Commonly known as:  XARELTO Take 1 tablet (20 mg total) by mouth daily with supper.   senna-docusate 8.6-50 MG tablet Commonly known as:  Senokot-S Take 1 tablet by mouth 2 (two) times daily.   traMADol 50 MG tablet Commonly known as:  ULTRAM Take 1 tablet (50 mg total) by mouth 2 (two) times daily for 4 doses.      Allergies  Allergen Reactions  . Methocarbamol Hives  . Vicodin [Hydrocodone-Acetaminophen] Other (See Comments)    Interacts with bp medication and drops blood pressure  . Aspirin Nausea And Vomiting  . Butazolidin [Phenylbutazone] Other (See Comments)    Unknown reaction  . Celebrex [Celecoxib] Nausea And Vomiting  . Codeine  Nausea And Vomiting  . Doripenem Rash  . Aspirin Buf(Alhyd-Mghyd-Cacar) Nausea And Vomiting  . Mucinex [Guaifenesin Er] Itching  . Temazepam Other (See Comments)    Unknown reaction   Contact information for after-discharge care    Destination    [facility] .   Service:  Skilled Nursing Contact information: [place] [phone]               The results of significant diagnostics from this hospitalization (including imaging, microbiology, ancillary and laboratory) are listed below for reference.    Significant Diagnostic Studies: Ct Head Wo Contrast  Result Date: 02/01/2018 CLINICAL DATA:  Fall.  No loss of consciousness. EXAM: CT HEAD WITHOUT CONTRAST  CT CERVICAL SPINE WITHOUT CONTRAST TECHNIQUE: Multidetector CT imaging of the head and cervical spine was performed following the standard protocol without intravenous contrast. Multiplanar CT image reconstructions of the cervical spine were also generated. COMPARISON:  None. FINDINGS: CT HEAD FINDINGS Brain: Mild chronic ischemic white matter disease is noted. No mass effect or midline shift is noted. Ventricular size is within normal limits. There is no evidence of mass lesion, hemorrhage or acute infarction. Vascular: No hyperdense vessel or unexpected calcification. Skull: Normal. Negative for fracture or focal lesion. Sinuses/Orbits: Right maxillary mucous retention cyst is noted. Other: None. CT CERVICAL SPINE FINDINGS Alignment: Normal. Skull base and vertebrae: No acute fracture. No primary bone lesion or focal pathologic process. Soft tissues and spinal canal: No prevertebral fluid or swelling. No visible canal hematoma. Disc levels:  Status post surgical anterior fusion of C5-6 and C6-7. Upper chest: Negative. Other: Degenerative changes are seen involving posterior facet joints bilaterally. IMPRESSION: Mild chronic ischemic white matter disease. No acute intracranial abnormality seen. Postsurgical and degenerative changes are noted in the cervical spine as described above. No acute fracture or spondylolisthesis is noted. Electronically Signed   By: Marijo Conception, M.D.   On: 02/01/2018 07:21   Ct Angio Chest Pe W Or Wo Contrast  Result Date: 02/07/2018 CLINICAL DATA:  Dyspnea, wheezing, history asthma, CHF, worsened symptoms since yesterday, hyperventilation, abdominal distension; history of biliary dilatation and central biliary obstruction post percutaneous drainage EXAM: CT ANGIOGRAPHY CHEST CT ABDOMEN AND PELVIS WITH CONTRAST TECHNIQUE: Multidetector CT imaging of the chest was performed using the standard protocol during bolus administration of intravenous contrast. Multiplanar CT image  reconstructions and MIPs were obtained to evaluate the vascular anatomy. Multidetector CT imaging of the abdomen and pelvis was performed using the standard protocol during bolus administration of intravenous contrast. CONTRAST:  142m ISOVUE-370 IOPAMIDOL (ISOVUE-370) INJECTION 76% IV. COMPARISON:  CT abdomen and pelvis 02/01/2018 FINDINGS: CTA CHEST FINDINGS Cardiovascular: Atherosclerotic calcifications of aorta, proximal great vessels and coronary arteries. Cardiac chambers appear mildly enlarged. No pericardial effusion. Pulmonary arteries adequately opacified and patent. No evidence of pulmonary embolism. Mediastinum/Nodes: Base of cervical region normal appearance. No thoracic adenopathy. Esophagus unremarkable. Lungs/Pleura: Small focus of infiltrate or atelectasis at anterior LEFT upper lobe image 51. Multiple tiny BILATERAL lung nodules, noncalcified. 7 mm LEFT lower lobe nodule image 50. Remaining lungs clear. No infiltrate, pleural effusion or pneumothorax Musculoskeletal: Osseous demineralization. Prior cervical spine fusion. No definite bone lesions. Review of the MIP images confirms the above findings. CT ABDOMEN and PELVIS FINDINGS Hepatobiliary: Percutaneous biliary drain decompression of the RIGHT biliary system since previous exam. Persistent dilatation of the intrahepatic biliary radicles of the LEFT lobe. No focal hepatic mass lesion or visualized obstructing mass at the central biliary  tree. Gallbladder surgically absent. Pancreas: Normal appearance Spleen: Normal appearance Adrenals/Urinary Tract: Adrenal glands normal appearance. Tiny cyst at inferior pole RIGHT kidney. Kidneys, ureters, and bladder otherwise unremarkable. Stomach/Bowel: Appendix surgically absent by history. Dense retained contrast in transverse colon. Stomach decompressed. Stomach and bowel loops otherwise unremarkable. Vascular/Lymphatic: Atherosclerotic calcifications of aorta and iliac arteries. Aorta normal caliber. No  adenopathy. Reproductive: Uterus surgically absent with nonvisualization of ovaries Other: No free air or free fluid. No hernia or acute inflammatory process. Musculoskeletal: No acute osseous lesions. RIGHT hip prosthesis noted. Degenerative disc and facet disease changes lumbar spine. Review of the MIP images confirms the above findings. IMPRESSION: No evidence of pulmonary embolism. Multiple small BILATERAL noncalcified pulmonary nodules measuring up to 7 mm in largest diameter in LEFT lobe, nonspecific but pulmonary metastases are not excluded. Persistent intrahepatic biliary dilatation involving the LEFT lobe of the liver with decreased biliary dilatation of the RIGHT lobe biliary tree post percutaneous drainage. Scattered atherosclerotic calcifications including within coronary arteries. Electronically Signed   By: Lavonia Dana M.D.   On: 02/07/2018 17:33   Ct Cervical Spine Wo Contrast  Result Date: 02/01/2018 CLINICAL DATA:  Fall.  No loss of consciousness. EXAM: CT HEAD WITHOUT CONTRAST CT CERVICAL SPINE WITHOUT CONTRAST TECHNIQUE: Multidetector CT imaging of the head and cervical spine was performed following the standard protocol without intravenous contrast. Multiplanar CT image reconstructions of the cervical spine were also generated. COMPARISON:  None. FINDINGS: CT HEAD FINDINGS Brain: Mild chronic ischemic white matter disease is noted. No mass effect or midline shift is noted. Ventricular size is within normal limits. There is no evidence of mass lesion, hemorrhage or acute infarction. Vascular: No hyperdense vessel or unexpected calcification. Skull: Normal. Negative for fracture or focal lesion. Sinuses/Orbits: Right maxillary mucous retention cyst is noted. Other: None. CT CERVICAL SPINE FINDINGS Alignment: Normal. Skull base and vertebrae: No acute fracture. No primary bone lesion or focal pathologic process. Soft tissues and spinal canal: No prevertebral fluid or swelling. No visible canal  hematoma. Disc levels:  Status post surgical anterior fusion of C5-6 and C6-7. Upper chest: Negative. Other: Degenerative changes are seen involving posterior facet joints bilaterally. IMPRESSION: Mild chronic ischemic white matter disease. No acute intracranial abnormality seen. Postsurgical and degenerative changes are noted in the cervical spine as described above. No acute fracture or spondylolisthesis is noted. Electronically Signed   By: Marijo Conception, M.D.   On: 02/01/2018 07:21   Mr Abdomen W GY Contrast  Result Date: 02/12/2018 CLINICAL DATA:  Painless jaundice. Weight loss. Obstructive mass suspected in the LEFT hepatic lobe. EXAM: MRI ABDOMEN WITHOUT AND WITH CONTRAST TECHNIQUE: Multiplanar multisequence MR imaging of the abdomen was performed both before and after the administration of intravenous contrast. CONTRAST:  10 mL Gadavist COMPARISON:  CT abdomen 02/01/2018, 02/07/2018 FINDINGS: Lower chest: Hepatobiliary: Focal duct dilatation in the lateral segment of the LEFT hepatic lobe. There is a ill-defined region of high signal intensity on T2 weighted imaging at the confluence of this biliary obstruction measuring approximately 4.6 x 2.8 cm (image 17/5). Mass lesion at the junction of the LEFT hepatic lobe and RIGHT hepatic lobe. The mass is not well-defined on the post-contrast imaging. No lesion evident within the RIGHT hepatic lobe of the liver. Biliary stent from a percutaneous approach in the RIGHT hepatic lobe noted. Pancreas: No pancreatic inflammation or duct dilatation. Cystic lesion in the mid body pancreas measuring 8 mm (image 27/5) may communicate with ductal the duct is not enlarged.  Spleen: Normal spleen. Adrenals/urinary tract: Adrenal glands and kidneys are normal. Stomach/Bowel: Stomach and limited of the small bowel is unremarkable Vascular/Lymphatic: Abdominal aortic normal caliber. No retroperitoneal periportal lymphadenopathy. Musculoskeletal: No aggressive osseous lesion  IMPRESSION: 1. Ill-defined mass in the central LEFT hepatic lobe is presumed etiology of the severe LEFT hepatic lobe biliary obstruction. Lesion best depicted on T2 weighted imaging 2. No clear evidence of lesion within the RIGHT hepatic lobe. 3. Common bile duct normal. 4. No pancreatic duct dilatation. 5. Cystic lesion in the body pancreas could represent a side branch IP MT. Recommend attention on routine follow-up. Electronically Signed   By: Suzy Bouchard M.D.   On: 02/12/2018 13:23   Ct Abdomen Pelvis W Contrast  Result Date: 02/07/2018 CLINICAL DATA:  Dyspnea, wheezing, history asthma, CHF, worsened symptoms since yesterday, hyperventilation, abdominal distension; history of biliary dilatation and central biliary obstruction post percutaneous drainage EXAM: CT ANGIOGRAPHY CHEST CT ABDOMEN AND PELVIS WITH CONTRAST TECHNIQUE: Multidetector CT imaging of the chest was performed using the standard protocol during bolus administration of intravenous contrast. Multiplanar CT image reconstructions and MIPs were obtained to evaluate the vascular anatomy. Multidetector CT imaging of the abdomen and pelvis was performed using the standard protocol during bolus administration of intravenous contrast. CONTRAST:  177m ISOVUE-370 IOPAMIDOL (ISOVUE-370) INJECTION 76% IV. COMPARISON:  CT abdomen and pelvis 02/01/2018 FINDINGS: CTA CHEST FINDINGS Cardiovascular: Atherosclerotic calcifications of aorta, proximal great vessels and coronary arteries. Cardiac chambers appear mildly enlarged. No pericardial effusion. Pulmonary arteries adequately opacified and patent. No evidence of pulmonary embolism. Mediastinum/Nodes: Base of cervical region normal appearance. No thoracic adenopathy. Esophagus unremarkable. Lungs/Pleura: Small focus of infiltrate or atelectasis at anterior LEFT upper lobe image 51. Multiple tiny BILATERAL lung nodules, noncalcified. 7 mm LEFT lower lobe nodule image 50. Remaining lungs clear. No  infiltrate, pleural effusion or pneumothorax Musculoskeletal: Osseous demineralization. Prior cervical spine fusion. No definite bone lesions. Review of the MIP images confirms the above findings. CT ABDOMEN and PELVIS FINDINGS Hepatobiliary: Percutaneous biliary drain decompression of the RIGHT biliary system since previous exam. Persistent dilatation of the intrahepatic biliary radicles of the LEFT lobe. No focal hepatic mass lesion or visualized obstructing mass at the central biliary tree. Gallbladder surgically absent. Pancreas: Normal appearance Spleen: Normal appearance Adrenals/Urinary Tract: Adrenal glands normal appearance. Tiny cyst at inferior pole RIGHT kidney. Kidneys, ureters, and bladder otherwise unremarkable. Stomach/Bowel: Appendix surgically absent by history. Dense retained contrast in transverse colon. Stomach decompressed. Stomach and bowel loops otherwise unremarkable. Vascular/Lymphatic: Atherosclerotic calcifications of aorta and iliac arteries. Aorta normal caliber. No adenopathy. Reproductive: Uterus surgically absent with nonvisualization of ovaries Other: No free air or free fluid. No hernia or acute inflammatory process. Musculoskeletal: No acute osseous lesions. RIGHT hip prosthesis noted. Degenerative disc and facet disease changes lumbar spine. Review of the MIP images confirms the above findings. IMPRESSION: No evidence of pulmonary embolism. Multiple small BILATERAL noncalcified pulmonary nodules measuring up to 7 mm in largest diameter in LEFT lobe, nonspecific but pulmonary metastases are not excluded. Persistent intrahepatic biliary dilatation involving the LEFT lobe of the liver with decreased biliary dilatation of the RIGHT lobe biliary tree post percutaneous drainage. Scattered atherosclerotic calcifications including within coronary arteries. Electronically Signed   By: MLavonia DanaM.D.   On: 02/07/2018 17:33   Ct Abdomen Pelvis W Contrast  Result Date:  02/01/2018 CLINICAL DATA:  85year old female with history of jaundice. Dark urine. EXAM: CT ABDOMEN AND PELVIS WITH CONTRAST TECHNIQUE: Multidetector CT imaging of the abdomen  and pelvis was performed using the standard protocol following bolus administration of intravenous contrast. CONTRAST:  18m OMNIPAQUE IOHEXOL 300 MG/ML  SOLN COMPARISON:  No priors. FINDINGS: Lower chest: Aortic atherosclerosis. Atherosclerotic calcifications in the right coronary artery. Hepatobiliary: In the left lobe of the liver there is a central lesion that is poorly defined but estimated to measure approximately 5.5 x 3.3 x 3.6 cm (axial image 18 of series 3 and coronal image 51 of series 6). This is associated with severe intrahepatic biliary ductal dilatation, which is most severe throughout segments 2 and 3, but involves all areas of the liver. Status post cholecystectomy. Common bile duct is not dilated. Pancreas: No pancreatic mass. No pancreatic ductal dilatation. No pancreatic or peripancreatic fluid or inflammatory changes. Spleen: Unremarkable. Adrenals/Urinary Tract: 1.4 cm with simple cyst in the lower pole of the right kidney. Left kidney is normal in appearance. No hydroureteronephrosis. Urinary bladder is normal in appearance. Bilateral adrenal glands are normal in appearance. Stomach/Bowel: Normal appearance of the stomach. No pathologic dilatation of small bowel or colon. Normal appendix. Vascular/Lymphatic: Aortic atherosclerosis, without evidence of aneurysm or dissection in the abdominal or pelvic vasculature. No lymphadenopathy noted in the abdomen or pelvis. Reproductive: Status post hysterectomy. Ovaries are not confidently identified and may be surgically absent or atrophic. Other: No significant volume of ascites.  No pneumoperitoneum. Musculoskeletal: Status post right hip arthroplasty. There are no aggressive appearing lytic or blastic lesions noted in the visualized portions of the skeleton. IMPRESSION: 1.  Ill-defined central hepatic mass in the left lobe of the liver estimated to measure approximately 5.5 x 3.3 x 3.6 cm with associated severe intrahepatic biliary ductal dilatation. Findings are highly concerning for cholangiocarcinoma. 2. Aortic atherosclerosis, in addition to at least right coronary artery disease. 3. Additional incidental findings, as above. Electronically Signed   By: DVinnie LangtonM.D.   On: 02/01/2018 14:53   Dg Hand 2 View Right  Result Date: 02/18/2018 CLINICAL DATA:  85year old who fell and injured the RIGHT index finger earlier today. EXAM: RIGHT HAND - 2 VIEW COMPARISON:  02/01/2018. FINDINGS: Examination is performed in the finger splint. No evidence of acute fracture or dislocation. Complete loss of the joint spaces in the DIP joints of the index, long, ring and small fingers. Severe joint space narrowing involving the PIP joints of these fingers. Moderate narrowing of the IP joint space of the thumb and all of the MCP joint spaces. Severe narrowing of the trapezium-first metacarpal joint space in the wrist with intra-articular loose bodies. IMPRESSION: 1. No acute osseous abnormality. 2. Severe osteoarthritis. Electronically Signed   By: TEvangeline DakinM.D.   On: 02/18/2018 19:30   Ct Biopsy  Result Date: 02/17/2018 INDICATION: 85year old with history of obstructive jaundice and status post placement of right biliary drain. Patient has persistent dilated left biliary ducts with a poorly defined lesion in the left hepatic lobe. Patient needs a tissue diagnosis. EXAM: IMAGE GUIDED LEFT HEPATIC MASS BIOPSY WITH CT AND ULTRASOUND GUIDANCE MEDICATIONS: Sedation medications one. ANESTHESIA/SEDATION: Moderate (conscious) sedation was employed during this procedure. A total of Versed 1.5 mg and Fentanyl 75 mcg was administered intravenously. Moderate Sedation Time: 28 minutes. The patient's level of consciousness and vital signs were monitored continuously by radiology nursing  throughout the procedure under my direct supervision. FLUOROSCOPY TIME:  None COMPLICATIONS: None immediate. PROCEDURE: Informed written consent was obtained from the patient after a thorough discussion of the procedural risks, benefits and alternatives. All questions were addressed. Maximal Sterile  Barrier Technique was utilized including caps, mask, sterile gowns, sterile gloves, sterile drape, hand hygiene and skin antiseptic. A timeout was performed prior to the initiation of the procedure. Liver was evaluated with ultrasound and CT. Anterior abdomen was prepped with chlorhexidine and sterile field was created. Skin and soft tissues anesthetized with 1% lidocaine. 32 gauge coaxial needle directed into the poorly defined area or lesion in the left hepatic lobe with ultrasound guidance. Needle position was confirmed with CT. Total of 3 core biopsies were obtained with an 18 gauge device. Specimens placed in formalin. 17 gauge needle was removed without complication. Bandage placed over the puncture site. FINDINGS: Poorly defined area in the medial left hepatic lobe is concerning for a mass based on the previous cross-sectional imaging. Dilated left biliary ducts lateral to the poorly defined area. Needle position was confirmed within the lesion. Three core biopsies were obtained. Gas within the tissue and bile ducts along the biopsy tract. No significant bleeding or hematoma formation. IMPRESSION: Image guided core biopsies of left hepatic lobe. Electronically Signed   By: Markus Daft M.D.   On: 02/17/2018 17:10   Dg Chest Port 1 View  Result Date: 02/08/2018 CLINICAL DATA:  85 year old female with shortness breath for the past week. Subsequent encounter. EXAM: PORTABLE CHEST 1 VIEW COMPARISON:  02/07/2018 chest CT and chest x-ray. FINDINGS: Mild cardiomegaly. Central pulmonary vascular prominence. No segmental consolidation or pneumothorax. CT detected pulmonary nodules not appreciated on present plain film  exam. Calcified aorta. Calcified left carotid bifurcation. Bilateral acromioclavicular joint degenerative changes. Prior surgery lower cervical spine. IMPRESSION: 1. Mild cardiomegaly. 2. Mild central pulmonary vascular prominence. 3. Blunting left costophrenic angle similar to prior exams and may be related to prominent epicardial fat pad/subsegmental atelectasis as seen on CT. 4. CT detected pulmonary nodules not appreciated on present plain film exam. Electronically Signed   By: Genia Del M.D.   On: 02/08/2018 07:05   Dg Chest Port 1 View  Result Date: 02/07/2018 CLINICAL DATA:  Shortness of breath with altered mental status. EXAM: PORTABLE CHEST 1 VIEW COMPARISON:  02/09/2015 FINDINGS: Lungs are adequately inflated without focal airspace consolidation or effusion. Cardiomediastinal silhouette and remainder of the exam is unchanged. IMPRESSION: No active disease. Electronically Signed   By: Marin Olp M.D.   On: 02/07/2018 12:37   Dg Abdomen Acute W/chest  Result Date: 02/16/2018 CLINICAL DATA:  Nausea, vomiting, diarrhea and abdominal pain. EXAM: DG ABDOMEN ACUTE W/ 1V CHEST COMPARISON:  Portable chest dated 02/08/2018. Chest CTA and abdomen and pelvis CT dated 06/2018. Abdomen MR dated 02/12/2018. FINDINGS: Normal sized heart. Clear lungs with normal vascularity. Left breast surgical clips. Cervical spine fixation hardware. Normal bowel gas pattern without free peritoneal air. Stable percutaneous biliary drainage catheter and right hip prosthesis. Lumbar and thoracic spine degenerative changes. IMPRESSION: No acute abnormality. Electronically Signed   By: Claudie Revering M.D.   On: 02/16/2018 17:35   Dg Hand Complete Right  Result Date: 02/01/2018 CLINICAL DATA:  Status post dislocation at the PIP joint of the index finger of the right hand. Post reduction views. EXAM: RIGHT HAND - COMPLETE 3+ VIEW COMPARISON:  Prevertebral in image of today's date at 6:29 a.m. FINDINGS: The previously  dislocated PIP joint is now in normal alignment. There is a small bony density adjacent to the dorso-ulnar aspect of the head of the proximal phalanx of the index finger which may reflect an avulsion fracture. The PIP joint space is well maintained. There is severe osteoarthritic  joint space loss with surrounding proliferative changes involving the DIP joint of the second as well as third, fourth, and fifth fingers. There are degenerative changes of the first [facility] joint. IMPRESSION: Successful relocation of the previously laterally dislocated index finger at the PIP joint. Possible small avulsion from the ulnar aspect of the head of the proximal phalanx. Significant DIP joint osteoarthritic change of the second through fifth digits and of the first [facility] joint. Electronically Signed   By: David  Martinique M.D.   On: 02/01/2018 08:01   Dg Hand Complete Right  Result Date: 02/01/2018 CLINICAL DATA:  Status post fall, hitting right hand on tub, with deformity of the right index finger. Initial encounter. EXAM: RIGHT HAND - COMPLETE 3+ VIEW COMPARISON:  None. FINDINGS: There is dislocation at the second proximal interphalangeal joint, with ulnar and dorsal angulation. No definite fracture is characterized. Degenerative change at the first carpometacarpal joint, and at the distal interphalangeal joints, raises concern for osteoarthritis. No definite soft tissue abnormalities are characterized on radiograph. Negative ulnar variance is noted. IMPRESSION: Dislocation at the second proximal interphalangeal joint, with ulnar and dorsal angulation. No definite fracture seen. Electronically Signed   By: Garald Balding M.D.   On: 02/01/2018 06:49   Ir Abdomen US Limited  Result Date: 02/17/2018 INDICATION: 85 year old with history of obstructive jaundice and status post placement of right biliary drain. Patient has persistent dilated left biliary ducts with a poorly defined lesion in the left hepatic lobe. Patient needs a  tissue diagnosis. EXAM: IMAGE GUIDED LEFT HEPATIC MASS BIOPSY WITH CT AND ULTRASOUND GUIDANCE MEDICATIONS: Sedation medications one. ANESTHESIA/SEDATION: Moderate (conscious) sedation was employed during this procedure. A total of Versed 1.5 mg and Fentanyl 75 mcg was administered intravenously. Moderate Sedation Time: 28 minutes. The patient's level of consciousness and vital signs were monitored continuously by radiology nursing throughout the procedure under my direct supervision. FLUOROSCOPY TIME:  None COMPLICATIONS: None immediate. PROCEDURE: Informed written consent was obtained from the patient after a thorough discussion of the procedural risks, benefits and alternatives. All questions were addressed. Maximal Sterile Barrier Technique was utilized including caps, mask, sterile gowns, sterile gloves, sterile drape, hand hygiene and skin antiseptic. A timeout was performed prior to the initiation of the procedure. Liver was evaluated with ultrasound and CT. Anterior abdomen was prepped with chlorhexidine and sterile field was created. Skin and soft tissues anesthetized with 1% lidocaine. 75 gauge coaxial needle directed into the poorly defined area or lesion in the left hepatic lobe with ultrasound guidance. Needle position was confirmed with CT. Total of 3 core biopsies were obtained with an 18 gauge device. Specimens placed in formalin. 17 gauge needle was removed without complication. Bandage placed over the puncture site. FINDINGS: Poorly defined area in the medial left hepatic lobe is concerning for a mass based on the previous cross-sectional imaging. Dilated left biliary ducts lateral to the poorly defined area. Needle position was confirmed within the lesion. Three core biopsies were obtained. Gas within the tissue and bile ducts along the biopsy tract. No significant bleeding or hematoma formation. IMPRESSION: Image guided core biopsies of left hepatic lobe. Electronically Signed   By: Markus Daft  M.D.   On: 02/17/2018 17:10   Ct Renal Stone Study  Result Date: 02/16/2018 CLINICAL DATA:  Flank pain. Renal insufficiency. Nausea and vomiting. EXAM: CT ABDOMEN AND PELVIS WITHOUT CONTRAST TECHNIQUE: Multidetector CT imaging of the abdomen and pelvis was performed following the standard protocol without IV contrast. COMPARISON:  Chest, abdomen  and pelvis radiographs obtained earlier today. Chest CTA and abdomen and pelvis CT dated 02/07/2018. Abdomen and pelvis CT dated 02/01/2018. FINDINGS: Lower chest: Multiple small nodules at both lung bases. The largest is in the right lower lobe, measuring 5 mm on image number 26 series 4. This was not present on the previous examinations and multiple other nodules are also new on both sides. Hepatobiliary: Previously demonstrated percutaneous biliary drainage catheter, poorly defined left lobe liver mass and markedly dilated left lobe bile ducts. Cholecystectomy clips. Pancreas: Unremarkable. No pancreatic ductal dilatation or surrounding inflammatory changes. Spleen: Normal in size without focal abnormality. Adrenals/Urinary Tract: Normal appearing adrenal glands. Small lower pole right renal cyst. Normal appearing left kidney, ureters and urinary bladder. Portions of the urinary bladder and distal ureters are obscured by streak artifacts from a right hip prosthesis. Stomach/Bowel: Surgically absent appendix. Unremarkable stomach, small bowel and colon. Vascular/Lymphatic: Atheromatous arterial calcifications without aneurysm. No enlarged lymph nodes. Reproductive: Status post hysterectomy. No adnexal masses. Other: No abdominal wall hernia or abnormality. No abdominopelvic ascites. Musculoskeletal: Right hip prosthesis with associated streak artifacts. Lumbar and lower thoracic spine degenerative changes. IMPRESSION: 1. No acute abnormality. 2. Multiple interval small nodules at both lung bases suspicious for metastases. 3. Previously demonstrated left lobe liver mass  causing left lobe liver biliary obstruction. Electronically Signed   By: Claudie Revering M.D.   On: 02/16/2018 21:07   Dg Hips Bilat W Or Wo Pelvis 2 Views  Result Date: 02/01/2018 CLINICAL DATA:  Status post fall, with concern for pelvic injury. Initial encounter. EXAM: DG HIP (WITH OR WITHOUT PELVIS) 2V BILAT COMPARISON:  None. FINDINGS: There is no evidence of fracture or dislocation. The patient's right hip arthroplasty is grossly unremarkable in appearance, without evidence of significant loosening. The proximal left femur appears intact. Mild degenerative change is noted at the lower lumbar spine. The sacroiliac joints are unremarkable in appearance. The visualized bowel gas pattern is grossly unremarkable in appearance. Scattered phleboliths are noted within the pelvis. IMPRESSION: No evidence of fracture or dislocation. Right hip arthroplasty is grossly unremarkable in appearance. Electronically Signed   By: Garald Balding M.D.   On: 02/01/2018 06:51   Ir Int Lianne Cure Biliary Drain With Cholangiogram  Result Date: 02/02/2018 INDICATION: 85 year old with obstructive jaundice. Plan for percutaneous transhepatic cholangiogram with biliary drain placement. EXAM: PERCUTANEOUS TRANSHEPATIC CHOLANGIOGRAM WITH ULTRASOUND AND FLUOROSCOPIC GUIDANCE PLACEMENT OF INTERNAL/EXTERNAL BILIARY DRAIN MEDICATIONS: Zosyn 3.375 g; The antibiotic was administered within an appropriate time frame prior to the initiation of the procedure. ANESTHESIA/SEDATION: Moderate (conscious) sedation was employed during this procedure. A total of Versed 2.0 mg and Fentanyl 200 mcg was administered intravenously. Moderate Sedation Time: 68 minutes. The patient's level of consciousness and vital signs were monitored continuously by radiology nursing throughout the procedure under my direct supervision. FLUOROSCOPY TIME:  Fluoroscopy Time: 11 minutes 42 seconds (125 mGy). CONTRAST:  40 mL [id number] COMPLICATIONS: None immediate. PROCEDURE:  Informed written consent was obtained from the patient after a thorough discussion of the procedural risks, benefits and alternatives. All questions were addressed. Maximal Sterile Barrier Technique was utilized including caps, mask, sterile gowns, sterile gloves, sterile drape, hand hygiene and skin antiseptic. A timeout was performed prior to the initiation of the procedure. Patient was supine on the interventional table. The anterior and right abdomen was prepped and draped in sterile fashion. Ultrasound demonstrated dilated intrahepatic bile ducts. Initially, attention was directed to the left hepatic ducts. Skin was anesthetized with 1% lidocaine. Using ultrasound guidance,  21 gauge needle directed into dilated ducts but difficult to opacify the left lobe ducts. Wire would not advance centrally. As a result, attention was directed to the right hepatic lobe. Skin was anesthetized with 1% lidocaine. Using ultrasound guidance, a dilated right hepatic duct was punctured with a 21 gauge needle. Contrast injection confirmed placement in the biliary system. A 0.018 wire was advanced and Accustick dilator set was placed. A 5 French Kumpe catheter was advanced into the biliary system and additional cholangiogram was performed. Eventually, the central biliary system was successfully cannulated and a Glidewire was advanced into the extrahepatic bile duct. Additional cholangiograms were performed. Stiff Amplatz wire was advanced into the duodenum. Tract was dilated to accommodate a 10 [place] biliary drain. Large volume of yellow bilious fluid was removed from the biliary system. Samples sent for culture and cytology. Catheter was sutured to skin and attached to a gravity bag. Ultrasound confirmed that the left hepatic ducts were still dilated after placement of biliary drain. 21 gauge needle again directed into a dilated ducts with ultrasound but a wire would not advance centrally. FINDINGS: Severe intrahepatic biliary  dilatation. Access was obtained from right hepatic lobe. Obstruction at the central hepatic ducts. Distal common bile duct is patent. Right biliary system was decompressed after drain placement. Persistent left biliary dilatation after placement of the right biliary drain. Left biliary drain was attempted but unsuccessful due to configuration of the obstruction. IMPRESSION: Central biliary obstruction. Findings are suggestive for a neoplastic process such as a cholangiocarcinoma. The obstruction was successfully crossed from a right hepatic approach and the right biliary system was decompressed with a biliary drain. Dilated left biliary system and unable to decompress the left biliary system at this time. Recommend a repeat CT or MRI after few days of biliary drainage in order to re-evaluate the central hepatic lesion and left biliary obstruction. 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 Fluid was sent for cytology and culture. Electronically Signed   By: Markus Daft M.D.   On: 02/02/2018 11:56   Ir Exchange Biliary Drain  Result Date: 02/09/2018 INDICATION: 85 year old female with a history of central obstructing mass in the liver with biliary ductal dilatation and jaundice, initially treated with right-sided PTC and drainage 02/02/2018. She presents today for repositioning of the drain which has been partially withdrawn and a brush biopsy EXAM: ULTRASOUND-GUIDED THROUGH THE TUBE CHOLANGIOGRAM AND EXCHANGE OF WITHDRAWN BILIARY TUBE PLACEMENT OF NEW 12 FRENCH DRAIN ENDOLUMINAL BRUSH BIOPSY MEDICATIONS: None ANESTHESIA/SEDATION: Moderate (conscious) sedation was employed during this procedure. A total of Versed 2.5 mg and Fentanyl 125 mcg was administered intravenously. Moderate Sedation Time: 25 minutes. The patient's level of consciousness and vital signs were monitored continuously by radiology nursing throughout the procedure under my direct  supervision. FLUOROSCOPY TIME:  Fluoroscopy Time: 6 minutes 18 seconds (192 mGy). COMPLICATIONS: None PROCEDURE: Informed written consent was obtained from the patient after a thorough discussion of the procedural risks, benefits and alternatives. All questions were addressed. Maximal Sterile Barrier Technique was utilized including caps, mask, sterile gowns, sterile gloves, sterile drape, hand hygiene and skin antiseptic. A timeout was performed prior to the initiation of the procedure. Patient positioned supine position on the fluoroscopy table. Patient is prepped and draped in the usual sterile fashion. Scout images were acquired of the upper abdomen. Through the tube cholangiogram was performed demonstrating withdrawal of the previously existing 10 [place] biliary drain. A Glidewire was navigated into the  duodenum through the existing tract. Drain was removed from the [employer]. A short Kumpe the catheter was then navigated into the duodenum. Rose in wire was placed through the Kumpe the catheter and the Kumpe the catheter was withdrawn. A 7 French 35 cm bright tip sheath was then placed over the Rose an wire into the biliary system. Introducer was withdrawn and a pull-back cholangiogram was performed. The targeted narrowing was just cephalad of the clips in the liver hilum, at a region of ductal narrowing in the presumed area of tumor. Introducer was placed and the sheath was then again pushed beyond the occlusion. Coaxial cytology brush biopsy kit was advanced through the sheath, parallel to the safety wire. The sheath was then withdrawn and the brush biopsy kit was withdrawn into the region of narrowing with agitation across the lesion. Brush was then withdrawn placed into saline comp for cytology. This biopsy was then repeated for a total of 2 brush biopsies. The sheath was then withdrawn and we attempted to place a 12 [place] biliary drain over the Rose an wire. The tract required 12 French dilation. The 12  French drain was again attempted to place over the [facility] an wire, unsuccessful given the angle of approach through the abdominal wall. Kumpe the catheter was replaced to the duodenum and the Rose an wire was exchanged for stiff Amplatz 160 cm wire. We were then successful with placing the 12 [place] biliary drain over the wire into the duodenum, with the proximal side holes positioned just proximal to the presumed site of occlusion/tumor. Drain was sutured in position.  Final images were stored. Gravity drain was attached. Patient tolerated the procedure well and remained hemodynamically stable throughout. No complications were encountered and no significant blood loss. IMPRESSION: Status post routine exchange and up sizing of PTC/drainage with a new 12 French drain placed. 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 Signed, Dulcy Fanny. Dellia Nims, RPVI [facility] Radiology Electronically Signed   By: Corrie Mckusick D.O.   On: 02/09/2018 10:41   Ir Endoluminal Bx Of Biliary Tree  Result Date: 02/09/2018 INDICATION: 85 year old female with a history of central obstructing mass in the liver with biliary ductal dilatation and jaundice, initially treated with right-sided PTC and drainage 02/02/2018. She presents today for repositioning of the drain which has been partially withdrawn and a brush biopsy EXAM: ULTRASOUND-GUIDED THROUGH THE TUBE CHOLANGIOGRAM AND EXCHANGE OF WITHDRAWN BILIARY TUBE PLACEMENT OF NEW 12 FRENCH DRAIN ENDOLUMINAL BRUSH BIOPSY MEDICATIONS: None ANESTHESIA/SEDATION: Moderate (conscious) sedation was employed during this procedure. A total of Versed 2.5 mg and Fentanyl 125 mcg was administered intravenously. Moderate Sedation Time: 25 minutes. The patient's level of consciousness and vital signs were monitored continuously by radiology nursing throughout the procedure under my direct supervision.  FLUOROSCOPY TIME:  Fluoroscopy Time: 6 minutes 18 seconds (192 mGy). COMPLICATIONS: None PROCEDURE: Informed written consent was obtained from the patient after a thorough discussion of the procedural risks, benefits and alternatives. All questions were addressed. Maximal Sterile Barrier Technique was utilized including caps, mask, sterile gowns, sterile gloves, sterile drape, hand hygiene and skin antiseptic. A timeout was performed prior to the initiation of the procedure. Patient positioned supine position on the fluoroscopy table. Patient is prepped and draped in the usual sterile fashion. Scout images were acquired of the upper abdomen. Through the tube cholangiogram was performed demonstrating withdrawal of the previously existing 10 [place] biliary drain. A Glidewire was navigated  into the duodenum through the existing tract. Drain was removed from the [employer]. A short Kumpe the catheter was then navigated into the duodenum. Rose in wire was placed through the Kumpe the catheter and the Kumpe the catheter was withdrawn. A 7 French 35 cm bright tip sheath was then placed over the Rose an wire into the biliary system. Introducer was withdrawn and a pull-back cholangiogram was performed. The targeted narrowing was just cephalad of the clips in the liver hilum, at a region of ductal narrowing in the presumed area of tumor. Introducer was placed and the sheath was then again pushed beyond the occlusion. Coaxial cytology brush biopsy kit was advanced through the sheath, parallel to the safety wire. The sheath was then withdrawn and the brush biopsy kit was withdrawn into the region of narrowing with agitation across the lesion. Brush was then withdrawn placed into saline comp for cytology. This biopsy was then repeated for a total of 2 brush biopsies. The sheath was then withdrawn and we attempted to place a 12 [place] biliary drain over the Rose an wire. The tract required 12 French dilation. The 12 French drain  was again attempted to place over the [facility] an wire, unsuccessful given the angle of approach through the abdominal wall. Kumpe the catheter was replaced to the duodenum and the Rose an wire was exchanged for stiff Amplatz 160 cm wire. We were then successful with placing the 12 [place] biliary drain over the wire into the duodenum, with the proximal side holes positioned just proximal to the presumed site of occlusion/tumor. Drain was sutured in position.  Final images were stored. Gravity drain was attached. Patient tolerated the procedure well and remained hemodynamically stable throughout. No complications were encountered and no significant blood loss. IMPRESSION: Status post routine exchange and up sizing of PTC/drainage with a new 12 French drain placed. 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 Signed, Dulcy Fanny. Dellia Nims, RPVI [facility] Radiology Electronically Signed   By: Corrie Mckusick D.O.   On: 02/09/2018 10:41    Microbiology: Recent Results (from the past 240 hour(s))  Culture, blood (routine x 2)     Status: None   Collection Time: 02/16/18  4:48 PM  Result Value Ref Range Status   Specimen Description   Final    BLOOD RIGHT ARM Performed at [facility], [place] [address] [place], [place] [address]    Special Requests   Final    BOTTLES DRAWN AEROBIC ONLY Blood Culture adequate volume Performed at [place] [address] [place], [place] [address]    Culture   Final    NO GROWTH 5 DAYS Performed at [facility], [place] [address], [place], [place] [address]    Report Status 02/21/2018 FINAL  Final  Culture, blood (routine x 2)     Status: None   Collection Time: 02/16/18  4:53 PM  Result Value Ref Range Status   Specimen Description   Final    BLOOD RIGHT ARM Performed at [place] [address],  [place], [place] [address]    Special Requests   Final    BOTTLES DRAWN AEROBIC ONLY Blood Culture adequate volume Performed at [place] [address] [place], [place] [address]    Culture   Final    NO GROWTH 5 DAYS Performed at [facility], [place] [address]  [place], [place] [address]    Report Status 02/22/2018 FINAL  Final  MRSA PCR Screening     Status: None   Collection Time: 02/16/18 10:56 PM  Result Value Ref Range Status   MRSA by PCR NEGATIVE NEGATIVE Final    Comment:        The GeneXpert MRSA Assay (FDA approved for NASAL specimens only), is one component of a comprehensive MRSA colonization surveillance program. It is not intended to diagnose MRSA infection nor to guide or monitor treatment for MRSA infections. Performed at [facility], [place] [address], [place], [place] [address]      Labs: Basic Metabolic Panel: Recent Labs  Lab 02/18/18 0444 02/19/18 0416 02/20/18 0434 02/21/18 0430 02/22/18 0431 02/23/18 0421  NA 130* 134* 136 137 137 134*  K 3.6 3.1* 3.4* 3.8 4.2 4.1  CL 76* 81* 90* 96* 98 98  CO2 32 33* _0 GLUCOSE 134* 105* 107* 119* 110* 117*  BUN 102* 93* 78* 66* 52* 47*  CREATININE 5.79* 5.19* 4.55* 3.31* 2.39* 2.20*  CALCIUM 7.3* 6.7* 6.7* 7.3* 7.7* 7.6*  MG  --  2.4  --   --   --   --   PHOS 9.7*  --  5.0* 3.0 2.3* 2.0*   Liver Function Tests: Recent Labs  Lab 02/17/18 0531  02/19/18 0416 02/20/18 0434 02/21/18 0430 02/22/18 0431 02/23/18 0421  AST 290*  --  56* 29 24  --  14*  ALT 505*  --  239* 156* 109*  --  56*  ALKPHOS 281*  --  182* 162* 161*  --  122  BILITOT 2.6*  --  1.8* 1.8* 1.7*  --  1.3*  PROT 7.6  --  5.9* 6.0* 6.2*  --  6.1*  ALBUMIN 3.6   < > 2.7* 2.7*  2.7* 2.5*  2.5* 2.5* 2.4*  2.4*   < > = values in this interval not displayed.   No results for input(s): LIPASE, AMYLASE in the last 168 hours. No results for input(s): AMMONIA in the last 168  hours. CBC: Recent Labs  Lab 02/16/18 1820 02/17/18 0531 02/19/18 0416 02/20/18 0434 02/21/18 0430 02/23/18 0421  WBC 28.3* 23.3* 11.8* 14.5* 14.8* 15.5*  NEUTROABS 26.0*  --   --   --   --  12.9*  HGB 13.9 13.0 10.0* 9.8* 9.9* 8.8*  HCT 44.3 41.2 31.8* 31.9* 32.6* 29.7*  MCV 91.3 90.9 90.9 92.5 96.2 95.2  PLT 570* 478* 320 358 331 333   Cardiac Enzymes: No results for input(s): CKTOTAL, CKMB, CKMBINDEX, TROPONINI in the last 168 hours. BNP: BNP (last 3 results) No results for input(s): BNP in the last 8760 hours.  ProBNP (last 3 results) No results for input(s): PROBNP in the last 8760 hours.  CBG: No results for input(s): GLUCAP in the last 168 hours.     Signed:  Nita Sells MD   [facility] 02/23/2018, 12:22 PM

## 2018-02-23 NOTE — Progress Notes (Signed)
Referring Physician(s): Reece Levy  Supervising Physician: Aletta Edouard  Patient Status:  [facility] - In-pt  Chief Complaint: Abdominal pain, obstructive jaundice/pancreaticobiliary adenocarcinoma   Subjective: Patient feeling better today.  Abdominal pain improved following uncapping and placement of biliary drain back to gravity bag ; denies nausea or vomiting.   Allergies: Methocarbamol; Vicodin [hydrocodone-acetaminophen]; Aspirin; Butazolidin [phenylbutazone]; Celebrex [celecoxib]; Codeine; Doripenem; Aspirin buf(alhyd-mghyd-cacar); Mucinex [guaifenesin er]; and Temazepam  Medications: Prior to Admission medications   Medication Sig Start Date End Date Taking? Authorizing Provider  albuterol (PROVENTIL HFA;VENTOLIN HFA) 108 (90 BASE) MCG/ACT inhaler Inhale 1 puff into the lungs every 6 (six) hours as needed for wheezing. Reported on 04/10/2015   Yes [provider]  amLODipine (NORVASC) 10 MG tablet Take 10 mg by mouth daily.   Yes [provider]  budesonide-formoterol (SYMBICORT) 160-4.5 MCG/ACT inhaler Inhale 2 puffs into the lungs See admin instructions. Inhale 1 puff every morning, may inhale a 2nd puff in the evening if needed for wheezing   Yes [provider]  feeding supplement, ENSURE ENLIVE, (ENSURE ENLIVE) LIQD Take 237 mLs by mouth 3 (three) times daily between meals. 02/12/18  Yes Hongalgi, Lenis Dickinson, MD  Multiple Vitamin (MULTIVITAMIN WITH MINERALS) TABS tablet Take 1 tablet by mouth daily.   Yes [provider]  Nebivolol HCl (BYSTOLIC) 20 MG TABS Take 1 tablet (20 mg total) by mouth daily. 10/24/15  Yes Eran Windish, Jeneen Rinks, MD  nitroGLYCERIN (NITROSTAT) 0.4 MG SL tablet Place 0.4 mg under the tongue every 5 (five) minutes as needed for chest pain.   Yes [provider]  omeprazole (PRILOSEC) 20 MG capsule Take 20 mg by mouth daily.   Yes [provider]  ondansetron (ZOFRAN) 4 MG tablet Take 1 tablet (4 mg total) by  mouth every 8 (eight) hours as needed for nausea or vomiting. 02/12/18  Yes Hongalgi, Lenis Dickinson, MD  polyethylene glycol (MIRALAX / GLYCOLAX) packet Take 17 g by mouth 2 (two) times daily. 02/12/18  Yes Hongalgi, Lenis Dickinson, MD  rivaroxaban (XARELTO) 20 MG TABS tablet Take 1 tablet (20 mg total) by mouth daily with supper. 10/24/15  Yes Quinteria Chisum, Jeneen Rinks, MD  senna-docusate (SENOKOT-S) 8.6-50 MG tablet Take 1 tablet by mouth 2 (two) times daily. 02/12/18  Yes Hongalgi, Lenis Dickinson, MD  nebivolol (BYSTOLIC) 10 MG tablet Take 1 tablet (10 mg total) by mouth daily. 02/23/18   Nita Sells, MD  potassium chloride SA (K-DUR,KLOR-CON) 20 MEQ tablet Take 2 tablets (40 mEq total) by mouth daily. 02/23/18   Nita Sells, MD  traMADol (ULTRAM) 50 MG tablet Take 1 tablet (50 mg total) by mouth 2 (two) times daily for 4 doses. 02/23/18 02/25/18  Nita Sells, MD     Vital Signs: BP (!) 145/54 (BP Location: Right Arm)   Pulse 90   Temp 99 F (37.2 C) (Oral)   Resp 20   Ht 5\' 6"  (1.676 m)   Wt 233 lb 14.5 oz (106.1 kg)   SpO2 95%   BMI 37.75 kg/m   Physical Exam right biliary drain intact, output 150 cc green bile, insertion site okay, minimal tenderness  Imaging: No results found.  Labs:  CBC: Recent Labs    02/19/18 0416 02/20/18 0434 02/21/18 0430 02/23/18 0421  WBC 11.8* 14.5* 14.8* 15.5*  HGB 10.0* 9.8* 9.9* 8.8*  HCT 31.8* 31.9* 32.6* 29.7*  PLT 320 358 331 333    COAGS: Recent Labs    02/01/18 1712  02/08/18 0531 02/08/18 1140 02/09/18 0512  02/09/18 2236 02/16/18 1820 02/19/18 0416  INR 1.16  --   --   --  1.10  --  1.49  --   APTT  --    < > 84* 91*  --  35  --  52*   < > = values in this interval not displayed.    BMP: Recent Labs    02/20/18 0434 02/21/18 0430 02/22/18 0431 02/23/18 0421  NA 136 137 137 134*  K 3.4* 3.8 4.2 4.1  CL 90* 96* 98 98  CO2 30 28 27 25   GLUCOSE 107* 119* 110* 117*  BUN 78* 66* 52* 47*  CALCIUM 6.7* 7.3* 7.7* 7.6*    CREATININE 4.55* 3.31* 2.39* 2.20*  GFRNONAA 8* 12* 18* 20*  GFRAA 10* 14* 21* 23*    LIVER FUNCTION TESTS: Recent Labs    02/19/18 0416 02/20/18 0434 02/21/18 0430 02/22/18 0431 02/23/18 0421  BILITOT 1.8* 1.8* 1.7*  --  1.3*  AST 56* 29 24  --  14*  ALT 239* 156* 109*  --  56*  ALKPHOS 182* 162* 161*  --  122  PROT 5.9* 6.0* 6.2*  --  6.1*  ALBUMIN 2.7* 2.7*  2.7* 2.5*  2.5* 2.5* 2.4*  2.4*    Assessment and Plan: Patient with history of obstructive jaundice/persistentlydilated left biliary ducts and prior right biliary drain placement on 02/02/2018 with exchange and upsizing on 02/09/2018 along with negative common bile duct brush biopsy. Status post left hepatic lobe lesion biopsy on 1/15, path reveals pancreatobiliary adenocarcinoma; plans noted for transfer to SNF ; patient deemed poor surgical candidate.  Temp 99, creatinine 2.2, down slightly from 2.39, WBC 15.5, up from 14.8, hemoglobin 8.8, down from 9.9, total bilirubin 1.3, down from 1.7.  Case discussed with Dr. [name].  At this time recommend continued external drainage to gravity bag, once daily flushing of drain (NO ASPIRATION) with 5-10 cc normal saline, output recording and dressing changes every day.  She will be scheduled for follow-up drain exchange in 6 to 8 weeks.  May contact [phone] or [phone] with any drain related questions.   Electronically Signed: D. Rowe Robert, PA-C 02/23/2018, 11:45 AM   I spent a total of 15 minutes at the the patient's bedside AND on the patient's hospital floor or unit, greater than 50% of which was counseling/coordinating care for biliary drain    Patient ID: Jordan Byrd, female   DOB: March 23, 1933, 85 y.o.   MRN: [id number]

## 2018-02-23 NOTE — Progress Notes (Addendum)
Report called to [facility]. Spoke with Otila Kluver, RN and all questions answered.  Transported via [facility].  Andria Meuse made aware.

## 2018-02-23 NOTE — Clinical Social Work Placement (Signed)
Pt admitting to [facility]- [phone]. Will arrange [facility] transportation Family at bedside agreeable DC information available on [place] approved admission during peer-to-peer appeal with attending and Dr. Amalia Hailey.   CLINICAL SOCIAL WORK PLACEMENT  NOTE  Date:  02/23/2018  Patient Details  Name: Jordan Byrd MRN: [id number] Date of Birth: 28-Oct-1933  Clinical Social Work is seeking post-discharge placement for this patient at the [place] level of care (*CSW will initial, date and re-position this form in  chart as items are completed):  Yes   Patient/family provided with [place] Work Department's list of facilities offering this level of care within the geographic area requested by the patient (or if unable, by the patient's family).  Yes   Patient/family informed of their freedom to choose among providers that offer the needed level of care, that participate in Medicare, Medicaid or managed care program needed by the patient, have an available bed and are willing to accept the patient.  Yes   Patient/family informed of [facility]'s ownership interest in [facility] and [facility], as well as of the fact that they are under no obligation to receive care at these facilities.  PASRR submitted to EDS on       PASRR number received on       Existing PASRR number confirmed on 02/18/18     FL2 transmitted to all facilities in geographic area requested by pt/family on 02/19/18     FL2 transmitted to all facilities within larger geographic area on       Patient informed that his/her managed care company has contracts with or will negotiate with certain facilities, including the following:        Yes   Patient/family informed of bed offers received.  Patient chooses bed at [facility]     Physician recommends and patient chooses bed at [facility]    Patient to be transferred to [facility] on 02/23/18.  Patient to be transferred  to facility by [facility]     Patient family notified on 02/23/18 of transfer.  Name of family member notified:  [facility]     PHYSICIAN       Additional Comment:    _______________________________________________ Nila Nephew, LCSW 02/23/2018, 1:44 PM [phone]

## 2018-02-23 NOTE — Progress Notes (Signed)
Events are during this hospitalization were noted.  The pathology results from February 17, 2018 were reviewed and discussed with the patient and her family today.  She appears to have a locally advanced cholangiocarcinoma that appears to be on resectable.  Her case was discussed with Dr. Barry Dienes who felt that resection would be a major operation and I do not think she can handle such a procedure.  The natural course of this disease was discussed today with the patient and her grandson.  Treatment options were also reviewed.  Given the fact that she is not a surgical candidate, any treatment would be palliative in nature.  Radiation plus or minus chemotherapy would be an option will offer disease control at best.  Long-term complication associated with this malignancy including further obstruction, disease progression and liver involvement was also reviewed.  We also discussed the poor prognosis associated with this disease as well.  Alternative to treatment would be supportive care only and hospice options were also reviewed today in detail.  We also addressed her prognosis which is likely poor despite her localized disease,.  She has multiple comorbid conditions and already dealing with biliary obstruction and renal failure.  The patient and her family will discuss these options at this time.  Plan for her to be discharged to a skilled nursing facility and follow-up on March 02, 2018 to discuss further steps at this time.  She is agreeable with this plan.  During her visit in a week we will discuss whether she would be a candidate for any palliative therapy depending on her performance status and recovery.  All her questions and her family's questions were answered today to their satisfaction.  25  minutes was spent with the patient face-to-face today.  More than 50% of time was dedicated to reviewing imaging studies, disease status, prognosis and future plan of care.

## 2018-02-23 NOTE — Progress Notes (Signed)
Physical Therapy Treatment Patient Details Name: Jordan Byrd MRN: [id number] DOB: [birth year] Today's Date: 02/23/2018    History of Present Illness 85 y.o. female was admitted for acute renal failure and note liver mass with drain was referred to PT for mobility evaluation.  Has sustained a recent fall at [facility], was on 2.5L O2 prior to admission.  PMHx:  HTN; CHF; remote breast cancer; afib,  jaundice, pleurisy, COPD, THA R hip, PNA, anemia,     PT Comments    Pt initially reluctant to participate in mobility, but agreed with family encouragement. Pt required min-mod assist +2 for transfer to recliner today. Pt lacks confidence with mobility and feels LEs L>R are too weak to hold her up. Pt struggled with LE exercises, PT educated pt on importance of LE exercises to promote LE strengthening for mobility. PT to continue to follow acutely.    Follow Up Recommendations  SNF     Equipment Recommendations  None recommended by PT    Recommendations for Other Services       Precautions / Restrictions Precautions Precautions: Fall Precaution Comments: Drain, mult. falls since November, 2LO2 via [place] Restrictions Weight Bearing Restrictions: No    Mobility  Bed Mobility Overal bed mobility: Needs Assistance Bed Mobility: Rolling;Sidelying to Sit Rolling: Min assist(roll towards L side of bed ) Sidelying to sit: Mod assist;HOB elevated;+2 for safety/equipment       General bed mobility comments: Min assist for rolling for technique and completion of roll with use of bed pad. Mod assist +2 for sidelying to sit for trunk elevation and LE management. Pt reports feeling dizzy upon first sitting, resolved with time. Pt sat EOB ~2 minutes to equilibrate.   Transfers Overall transfer level: Needs assistance Equipment used: Rolling walker (2 wheeled) Transfers: Sit to/from [facility] Sit to Stand: Mod assist;+2 physical assistance;From elevated surface Stand pivot  transfers: Min assist;+2 safety/equipment;From elevated surface       General transfer comment: Mod assist for initial power up and steadying. Upon standing, pt stating "I can't put weight on my left leg, it can't take it"; pt able to weight bear on LLE. Min assist for stand pivot to recliner for steadying, directing RW. Verbal cuing for hip extension and posture during transfer. Assist for slow lowering into recliner.   Ambulation/Gait Ambulation/Gait assistance: (NT - pt fatigued from transfer from bed to recliner)               Stairs             Wheelchair Mobility    Modified Rankin (Stroke Patients Only)       Balance Overall balance assessment: Needs assistance Sitting-balance support: Feet supported Sitting balance-Leahy Scale: Fair     Standing balance support: Bilateral upper extremity supported;During functional activity Standing balance-Leahy Scale: Poor Standing balance comment: Reliant on UE support and PT/PT aide                            Cognition Arousal/Alertness: Awake/alert Behavior During Therapy: WFL for tasks assessed/performed Overall Cognitive Status: Within Functional Limits for tasks assessed                                        Exercises General Exercises - Lower Extremity Ankle Circles/Pumps: AROM;Both;20 reps;Seated Long Arc Quad: AAROM;Both;10 reps;Seated Hip ABduction/ADduction: AAROM;Both;10 reps;Seated  General Comments        Pertinent Vitals/Pain Faces Pain Scale: Hurts little more Pain Location: L shoulder, hands, knees and ankles L>R due to arthritis per pt report Pain Descriptors / Indicators: Aching Pain Intervention(s): Limited activity within patient's tolerance;Repositioned;Monitored during session    Home Living                      Prior Function            PT Goals (current goals can now be found in the care plan section) Acute Rehab PT Goals Patient Stated  Goal: to go to rehab then home PT Goal Formulation: With patient Time For Goal Achievement: 03/06/18 Potential to Achieve Goals: Good Progress towards PT goals: Progressing toward goals    Frequency    Min 2X/week      PT Plan Current plan remains appropriate    Co-evaluation              AM-PAC PT "6 Clicks" Mobility   Outcome Measure  Help needed turning from your back to your side while in a flat bed without using bedrails?: A Lot Help needed moving from lying on your back to sitting on the side of a flat bed without using bedrails?: A Lot Help needed moving to and from a bed to a chair (including a wheelchair)?: A Lot Help needed standing up from a chair using your arms (e.g., wheelchair or bedside chair)?: Total Help needed to walk in hospital room?: Total Help needed climbing 3-5 steps with a railing? : Total 6 Click Score: 9    End of Session Equipment Utilized During Treatment: Gait belt;Oxygen Activity Tolerance: Patient limited by fatigue;Treatment limited secondary to medical complications (Comment);Patient limited by pain Patient left: with call bell/phone within reach;with family/visitor present;in chair Nurse Communication: Mobility status PT Visit Diagnosis: Unsteadiness on feet (R26.81);Muscle weakness (generalized) (M62.81)     Time: 2778-2423 PT Time Calculation (min) (ACUTE ONLY): 15 min  Charges:  $Neuromuscular Re-education: 8-22 mins                     Kinnick Maus Conception Chancy, PT Acute Rehabilitation Services Pager [phone]  Office [phone]  Netasha Wehrli D Cataleya Cristina 02/23/2018, 1:35 PM

## 2018-02-25 DIAGNOSIS — C221 Intrahepatic bile duct carcinoma: Secondary | ICD-10-CM | POA: Diagnosis not present

## 2018-02-25 DIAGNOSIS — I5032 Chronic diastolic (congestive) heart failure: Secondary | ICD-10-CM | POA: Diagnosis not present

## 2018-02-25 DIAGNOSIS — I480 Paroxysmal atrial fibrillation: Secondary | ICD-10-CM | POA: Diagnosis not present

## 2018-02-25 DIAGNOSIS — J438 Other emphysema: Secondary | ICD-10-CM | POA: Diagnosis not present

## 2018-03-01 DIAGNOSIS — C221 Intrahepatic bile duct carcinoma: Secondary | ICD-10-CM | POA: Diagnosis not present

## 2018-03-01 DIAGNOSIS — I5031 Acute diastolic (congestive) heart failure: Secondary | ICD-10-CM | POA: Diagnosis not present

## 2018-03-01 DIAGNOSIS — I129 Hypertensive chronic kidney disease with stage 1 through stage 4 chronic kidney disease, or unspecified chronic kidney disease: Secondary | ICD-10-CM | POA: Diagnosis not present

## 2018-03-01 DIAGNOSIS — R110 Nausea: Secondary | ICD-10-CM | POA: Diagnosis not present

## 2018-03-02 ENCOUNTER — Inpatient Hospital Stay: Payer: PPO | Attending: Oncology | Admitting: Oncology

## 2018-03-02 VITALS — BP 137/65 | HR 90 | Temp 97.6°F | Resp 18 | Ht 66.0 in

## 2018-03-02 DIAGNOSIS — C221 Intrahepatic bile duct carcinoma: Secondary | ICD-10-CM | POA: Insufficient documentation

## 2018-03-02 DIAGNOSIS — C50412 Malignant neoplasm of upper-outer quadrant of left female breast: Secondary | ICD-10-CM

## 2018-03-02 DIAGNOSIS — R110 Nausea: Secondary | ICD-10-CM | POA: Diagnosis not present

## 2018-03-02 DIAGNOSIS — I480 Paroxysmal atrial fibrillation: Secondary | ICD-10-CM | POA: Diagnosis not present

## 2018-03-02 DIAGNOSIS — R17 Unspecified jaundice: Secondary | ICD-10-CM | POA: Diagnosis not present

## 2018-03-02 DIAGNOSIS — I10 Essential (primary) hypertension: Secondary | ICD-10-CM | POA: Diagnosis not present

## 2018-03-02 DIAGNOSIS — I5032 Chronic diastolic (congestive) heart failure: Secondary | ICD-10-CM | POA: Diagnosis not present

## 2018-03-02 DIAGNOSIS — N19 Unspecified kidney failure: Secondary | ICD-10-CM | POA: Insufficient documentation

## 2018-03-02 NOTE — Progress Notes (Signed)
Hematology and Oncology Follow Up Visit  Jordan Byrd [id number] Jan 06, 1934 85 y.o. 03/02/2018 4:51 PM Jordan Byrd, MDGreen, [name], MD   Principle Diagnosis: 85 year old woman with adenocarcinoma arising from pancreatobiliary source diagnosed in January 2020.  She presented with jaundice and locally advanced disease.   Prior Therapy:  Status post left biliary drain placement January 2020.   Current therapy: Under evaluation for any further treatment.  Interim History: Jordan Byrd presents today for a follow-up visit.  She is a pleasant woman I saw in consultation while she was hospitalized in January 2020.  She presented with jaundice and hepatic mass that is biopsy-proven to be adenocarcinoma of pancreatobiliary origin.  She had a biliary drain placed and a prolonged hospitalization because of failure to thrive and dehydration.  She was discharged on February 23, 2018 to a skilled nursing facility with referral of palliative care and potential hospice enrollment.  Since her discharge, she is receiving a physical therapy and felt slightly stronger but still confined to a chair and bed mostly.  She does report some nausea and currently on antiemetics around-the-clock.  She does not report any pain or discomfort.  She does not report any headaches, blurry vision, syncope or seizures. Does not report any fevers, chills or sweats.  Does not report any cough, wheezing or hemoptysis.  Does not report any chest pain, palpitation, orthopnea or leg edema.  Does not report any bdominal pain.  Does not report any constipation or diarrhea.  Does not report any skeletal complaints.    Does not report frequency, urgency or hematuria.  Does not report any skin rashes or lesions.  Does not report any lymphadenopathy or petechiae.  Does not report any anxiety or depression.  Remaining review of systems is negative.    Medications: I have reviewed the patient's current medications.  Current Outpatient Medications   Medication Sig Dispense Refill  . albuterol (PROVENTIL HFA;VENTOLIN HFA) 108 (90 BASE) MCG/ACT inhaler Inhale 1 puff into the lungs every 6 (six) hours as needed for wheezing. Reported on 04/10/2015    . budesonide-formoterol (SYMBICORT) 160-4.5 MCG/ACT inhaler Inhale 2 puffs into the lungs See admin instructions. Inhale 1 puff every morning, may inhale a 2nd puff in the evening if needed for wheezing    . Melatonin 5 MG TABS Take 1 tablet by mouth at bedtime as needed.    . Multiple Vitamin (MULTIVITAMIN WITH MINERALS) TABS tablet Take 1 tablet by mouth daily.    . nebivolol (BYSTOLIC) 10 MG tablet Take 1 tablet (10 mg total) by mouth daily.    . nitroGLYCERIN (NITROSTAT) 0.4 MG SL tablet Place 0.4 mg under the tongue every 5 (five) minutes as needed for chest pain.    Marland Kitchen omeprazole (PRILOSEC) 20 MG capsule Take 20 mg by mouth daily.    . ondansetron (ZOFRAN) 4 MG tablet Take 1 tablet (4 mg total) by mouth every 8 (eight) hours as needed for nausea or vomiting.    . polyethylene glycol (MIRALAX / GLYCOLAX) packet Take 17 g by mouth 2 (two) times daily.    . potassium chloride SA (K-DUR,KLOR-CON) 20 MEQ tablet Take 2 tablets (40 mEq total) by mouth daily.    . rivaroxaban (XARELTO) 20 MG TABS tablet Take 1 tablet (20 mg total) by mouth daily with supper. 30 tablet 11  . senna-docusate (SENOKOT-S) 8.6-50 MG tablet Take 1 tablet by mouth 2 (two) times daily.    . traMADol (ULTRAM) 50 MG tablet Take 50 mg by mouth 4 (  four) times daily.     No current facility-administered medications for this visit.      Allergies:  Allergies  Allergen Reactions  . Methocarbamol Hives  . Vicodin [Hydrocodone-Acetaminophen] Other (See Comments)    Interacts with bp medication and drops blood pressure  . Aspirin Nausea And Vomiting  . Butazolidin [Phenylbutazone] Other (See Comments)    Unknown reaction  . Celebrex [Celecoxib] Nausea And Vomiting  . Codeine Nausea And Vomiting  . Doripenem Rash  . Aspirin  Buf(Alhyd-Mghyd-Cacar) Nausea And Vomiting  . Mucinex [Guaifenesin Er] Itching  . Temazepam Other (See Comments)    Unknown reaction    Past Medical History, Surgical history, Social history, and Family History were reviewed and updated.    Physical Exam: Blood pressure 137/65, pulse 90, temperature 97.6 F (36.4 C), temperature source Oral, resp. rate 18, height 5\' 6"  (1.676 m), SpO2 95 %.   ECOG: 3 General appearance: alert and cooperative appeared without distress. Head: Normocephalic, without obvious abnormality Oropharynx: No oral thrush or ulcers. Eyes: No scleral icterus.  Pupils are equal and round reactive to light. Lymph nodes: Cervical, supraclavicular, and axillary nodes normal. Heart:regular rate and rhythm, S1, S2 normal, no murmur, click, rub or gallop Lung:chest clear, no wheezing, rales, normal symmetric air entry Abdomin: soft, non-tender, without masses or organomegaly. Neurological: No motor, sensory deficits.  Intact deep tendon reflexes. Skin: No rashes or lesions.  No ecchymosis or petechiae. Musculoskeletal: No joint deformity or effusion. Psychiatric: Mood and affect are appropriate.    Lab Results: Lab Results  Component Value Date   WBC 15.5 (H) 02/23/2018   HGB 8.8 (L) 02/23/2018   HCT 29.7 (L) 02/23/2018   MCV 95.2 02/23/2018   PLT 333 02/23/2018     Chemistry      Component Value Date/Time   NA 134 (L) 02/23/2018 0421   NA 145 06/01/2013 0818   K 4.1 02/23/2018 0421   K 3.7 06/01/2013 0818   CL 98 02/23/2018 0421   CO2 25 02/23/2018 0421   CO2 27 06/01/2013 0818   BUN 47 (H) 02/23/2018 0421   BUN 36.1 (H) 06/01/2013 0818   CREATININE 2.20 (H) 02/23/2018 0421   CREATININE 1.40 (H) 04/25/2015 1043   CREATININE 1.4 (H) 06/01/2013 0818      Component Value Date/Time   CALCIUM 7.6 (L) 02/23/2018 0421   CALCIUM 10.1 06/01/2013 0818   ALKPHOS 122 02/23/2018 0421   ALKPHOS 87 06/01/2013 0818   AST 14 (L) 02/23/2018 0421   AST 15  06/01/2013 0818   ALT 56 (H) 02/23/2018 0421   ALT 12 06/01/2013 0818   BILITOT 1.3 (H) 02/23/2018 0421   BILITOT 0.30 06/01/2013 0818       Impression and Plan:  85 year old woman with the following:  1.  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  She presented with jaundice and failure to thrive and she was found to have disease that is unknown resectable.  The natural course of this disease was reviewed again with the patient.  Her case was also discussed with Dr. Barry Dienes who felt that she would not be a surgical candidate given the location of her tumor as well as multiple comorbid conditions.  Treatment options were discussed today which include radiation therapy alone, radiation with chemotherapy versus supportive care and hospice enrollment.  After discussion today, I have recommended proceeding with supportive care and possible hospice enrollment.  I feel she is a marginal candidate for any treatment and likely would not  improve her quality of life given her poor performance status.  2.  Jaundice: Related to biliary obstruction from her tumor.  Her bilirubin has nearly normalized with external drain.  3.  Renal failure: Related to dehydration and volume loss.  This has improved with hydration.  4.  Prognosis: She has an incurable malignancy with poor performance status and not a candidate for aggressive therapy.  Based on these findings, she has limited life expectancy  5.  Follow-up: We will be as needed as she will follow with hospice moving forward.  25  minutes was spent with the patient face-to-face today.  More than 50% of time was dedicated to reviewing disease status, imaging studies and answering questions regarding future plan of care.    Zola Button, MD 1/28/20204:51 PM

## 2018-03-02 NOTE — Progress Notes (Signed)
Hematology and Oncology Follow Up Visit  Jordan Byrd [id number] [birth year] 85 y.o. 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  She presented with jaundice and locally advanced disease.   Prior Therapy:  Status post left biliary drain placement January 2020.   Current therapy: Under evaluation for any further treatment.  Interim History: [name] presents today for a follow-up visit.  She is a pleasant woman I saw in consultation while she was hospitalized in January 2020.  She presented with jaundice and hepatic mass that is biopsy-proven to be adenocarcinoma of pancreatobiliary origin.  She had a biliary drain placed and a prolonged hospitalization because of failure to thrive and dehydration.  She was discharged on February 23, 2018 to a skilled nursing facility with referral of palliative care and potential hospice enrollment.  Since her discharge, she is receiving a physical therapy and felt slightly stronger but still confined to a chair and bed mostly.  She does report some nausea and currently on antiemetics around-the-clock.  She does not report any pain or discomfort.  She does not report any headaches, blurry vision, syncope or seizures. Does not report any fevers, chills or sweats.  Does not report any cough, wheezing or hemoptysis.  Does not report any chest pain, palpitation, orthopnea or leg edema.  Does not report any bdominal pain.  Does not report any constipation or diarrhea.  Does not report any skeletal complaints.    Does not report frequency, urgency or hematuria.  Does not report any skin rashes or lesions.  Does not report any lymphadenopathy or petechiae.  Does not report any anxiety or depression.  Remaining review of systems is negative.    Medications: I have reviewed the patient's current medications.  Current Outpatient Medications   Medication Sig Dispense Refill  . albuterol (PROVENTIL HFA;VENTOLIN HFA) 108 (90 BASE) MCG/ACT inhaler Inhale 1 puff into the lungs every 6 (six) hours as needed for wheezing. Reported on 04/10/2015    . budesonide-formoterol (SYMBICORT) 160-4.5 MCG/ACT inhaler Inhale 2 puffs into the lungs See admin instructions. Inhale 1 puff every morning, may inhale a 2nd puff in the evening if needed for wheezing    . Multiple Vitamin (MULTIVITAMIN WITH MINERALS) TABS tablet Take 1 tablet by mouth daily.    . nebivolol (BYSTOLIC) 10 MG tablet Take 1 tablet (10 mg total) by mouth daily.    . nitroGLYCERIN (NITROSTAT) 0.4 MG SL tablet Place 0.4 mg under the tongue every 5 (five) minutes as needed for chest pain.    Marland Kitchen omeprazole (PRILOSEC) 20 MG capsule Take 20 mg by mouth daily.    . ondansetron (ZOFRAN) 4 MG tablet Take 1 tablet (4 mg total) by mouth every 8 (eight) hours as needed for nausea or vomiting.    . polyethylene glycol (MIRALAX / GLYCOLAX) packet Take 17 g by mouth 2 (two) times daily.    . potassium chloride SA (K-DUR,KLOR-CON) 20 MEQ tablet Take 2 tablets (40 mEq total) by mouth daily.    . rivaroxaban (XARELTO) 20 MG TABS tablet Take 1 tablet (20 mg total) by mouth daily with supper. 30 tablet 11  . senna-docusate (SENOKOT-S) 8.6-50 MG tablet Take 1 tablet by mouth 2 (two) times daily.     No current facility-administered medications for this visit.      Allergies:  Allergies  Allergen Reactions  . Methocarbamol Hives  . Vicodin [Hydrocodone-Acetaminophen] Other (See  Comments)    Interacts with bp medication and drops blood pressure  . Aspirin Nausea And Vomiting  . Butazolidin [Phenylbutazone] Other (See Comments)    Unknown reaction  . Celebrex [Celecoxib] Nausea And Vomiting  . Codeine Nausea And Vomiting  . Doripenem Rash  . Aspirin Buf(Alhyd-Mghyd-Cacar) Nausea And Vomiting  . Mucinex [Guaifenesin Er] Itching  . Temazepam Other (See Comments)    Unknown reaction    Past  Medical History, Surgical history, Social history, and Family History were reviewed and updated.    Physical Exam: Blood pressure 137/65, pulse 90, temperature 97.6 F (36.4 C), temperature source Oral, resp. rate 18, height 5\' 6"  (1.676 m), SpO2 95 %.   ECOG: 3 General appearance: alert and cooperative appeared without distress. Head: Normocephalic, without obvious abnormality Oropharynx: No oral thrush or ulcers. Eyes: No scleral icterus.  Pupils are equal and round reactive to light. Lymph nodes: Cervical, supraclavicular, and axillary nodes normal. Heart:regular rate and rhythm, S1, S2 normal, no murmur, click, rub or gallop Lung:chest clear, no wheezing, rales, normal symmetric air entry Abdomin: soft, non-tender, without masses or organomegaly. Neurological: No motor, sensory deficits.  Intact deep tendon reflexes. Skin: No rashes or lesions.  No ecchymosis or petechiae. Musculoskeletal: No joint deformity or effusion. Psychiatric: Mood and affect are appropriate.    Lab Results: Lab Results  Component Value Date   WBC 15.5 (H) 02/23/2018   HGB 8.8 (L) 02/23/2018   HCT 29.7 (L) 02/23/2018   MCV 95.2 02/23/2018   PLT 333 02/23/2018     Chemistry      Component Value Date/Time   NA 134 (L) 02/23/2018 0421   NA 145 06/01/2013 0818   K 4.1 02/23/2018 0421   K 3.7 06/01/2013 0818   CL 98 02/23/2018 0421   CO2 25 02/23/2018 0421   CO2 27 06/01/2013 0818   BUN 47 (H) 02/23/2018 0421   BUN 36.1 (H) 06/01/2013 0818   CREATININE 2.20 (H) 02/23/2018 0421   CREATININE 1.40 (H) 04/25/2015 1043   CREATININE 1.4 (H) 06/01/2013 0818      Component Value Date/Time   CALCIUM 7.6 (L) 02/23/2018 0421   CALCIUM 10.1 06/01/2013 0818   ALKPHOS 122 02/23/2018 0421   ALKPHOS 87 06/01/2013 0818   AST 14 (L) 02/23/2018 0421   AST 15 06/01/2013 0818   ALT 56 (H) 02/23/2018 0421   ALT 12 06/01/2013 0818   BILITOT 1.3 (H) 02/23/2018 0421   BILITOT 0.30 06/01/2013 0818        Impression and Plan:  85 year old woman with the following:  1.  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  She presented with jaundice and failure to thrive and she was found to have disease that is unknown resectable.  The natural course of this disease was reviewed again with the patient.  Her case was also discussed with Dr. Barry Dienes who felt that she would not be a surgical candidate given the location of her tumor as well as multiple comorbid conditions.  Treatment options were discussed today which include radiation therapy alone, radiation with chemotherapy versus supportive care and hospice enrollment.  After discussion today, I have recommended proceeding with supportive care and possible hospice enrollment.  I feel she is a marginal candidate for any treatment and likely would not improve her quality of life given her poor performance status.  2.  Jaundice: Related to biliary obstruction from her tumor.  Her bilirubin has nearly normalized with external drain.  3.  Renal failure:  Related to dehydration and volume loss.  This has improved with hydration.  4.  Prognosis: She has an incurable malignancy with poor performance status and not a candidate for aggressive therapy.  Based on these findings, she has limited life expectancy  5.  Follow-up: We will be as needed as she will follow with hospice moving forward.  25  minutes was spent with the patient face-to-face today.  More than 50% of time was dedicated to reviewing disease status, imaging studies and answering questions regarding future plan of care.    Zola Button, MD 1/28/20203:33 PM

## 2018-03-03 ENCOUNTER — Telehealth: Payer: Self-pay | Admitting: *Deleted

## 2018-03-03 NOTE — Telephone Encounter (Signed)
Referral made to [facility] of [place] [facility] Faxed [facility], last O.V. note and recent labs to [phone]. Dr Alen Blew to be the attending, hospice physicians to do symptom management, and have DNR signed per families wishes.

## 2018-03-04 ENCOUNTER — Telehealth: Payer: Self-pay

## 2018-03-04 ENCOUNTER — Inpatient Hospital Stay: Admission: RE | Admit: 2018-03-04 | Payer: PPO | Source: Ambulatory Visit

## 2018-03-04 ENCOUNTER — Other Ambulatory Visit: Payer: PPO

## 2018-03-04 NOTE — Telephone Encounter (Signed)
Per 1/28 no los 

## 2018-03-05 ENCOUNTER — Other Ambulatory Visit: Payer: Self-pay | Admitting: *Deleted

## 2018-03-05 DIAGNOSIS — I10 Essential (primary) hypertension: Secondary | ICD-10-CM | POA: Diagnosis not present

## 2018-03-05 DIAGNOSIS — J9691 Respiratory failure, unspecified with hypoxia: Secondary | ICD-10-CM | POA: Diagnosis not present

## 2018-03-05 DIAGNOSIS — R918 Other nonspecific abnormal finding of lung field: Secondary | ICD-10-CM | POA: Diagnosis not present

## 2018-03-05 DIAGNOSIS — I480 Paroxysmal atrial fibrillation: Secondary | ICD-10-CM | POA: Diagnosis not present

## 2018-03-08 NOTE — Patient Outreach (Signed)
[facility]) Care Management  03/08/2018  [name] 25-Jul-1933 [id number]   Facility site visit to [facility] and [facility]. Collaboration with [facility]  concerning patient's progress, discharge plan and potential care management needs. Patient admitted to SNF on 02/23/18 after a hospitalization for failure to thrive, N/V after cancer diagnosis earlier this month. Planned discharge disposition is Hospice.  Patient will not have [facility] at discharge. Patient was evaluated for community based chronic disease management services with [facility] care Management Program as a benefit of patient's [employer].   Went to the bedside to speak with patient. Patient stated she felt terrible was continuously nauseated and has not been able to participate in PT. Collaboration with patient reveals patient's discharge plan is Hospice. [facility] Care Management services not appropriate at this time.  If patient's post SNF discharge needs change made [facility] UM Team member aware to place [facility] Management consult.   For questions please contact:   Abdulkarim Eberlin RN, [facility] Liaison  [phone]) [employer] [phone]) Toll free office

## 2018-04-04 DEATH — deceased

## 2018-04-06 ENCOUNTER — Inpatient Hospital Stay (HOSPITAL_COMMUNITY): Admit: 2018-04-06 | Payer: PPO

## 2019-12-30 IMAGING — CT CT ABD-PELV W/ CM
4 of 15 series · 13 of 46 positions shown, 17 images · IV contrast (APPLIED)
Comparison: CT abdomen and pelvis 02/01/2018

CLINICAL DATA: Dyspnea, wheezing, history asthma, CHF, worsened
symptoms since yesterday, hyperventilation, abdominal distension;
history of biliary dilatation and central biliary obstruction post
percutaneous drainage

EXAM:
CT ANGIOGRAPHY CHEST
CT ABDOMEN AND PELVIS WITH CONTRAST
TECHNIQUE: Multidetector CT imaging of the chest was performed using the
standard protocol during bolus administration of intravenous
contrast. Multiplanar CT image reconstructions and MIPs were
obtained to evaluate the vascular anatomy. Multidetector CT imaging
of the abdomen and pelvis was performed using the standard protocol
during bolus administration of intravenous contrast.
CONTRAST:  100mL FHRQN6-64L IOPAMIDOL (FHRQN6-64L) INJECTION 76% IV.

[Series 6: thins · axial · 0.87mm/px · z∈[+1255,+1525]mm · 8 of 325 slices shown]
[im 28/325  soft-tissue]
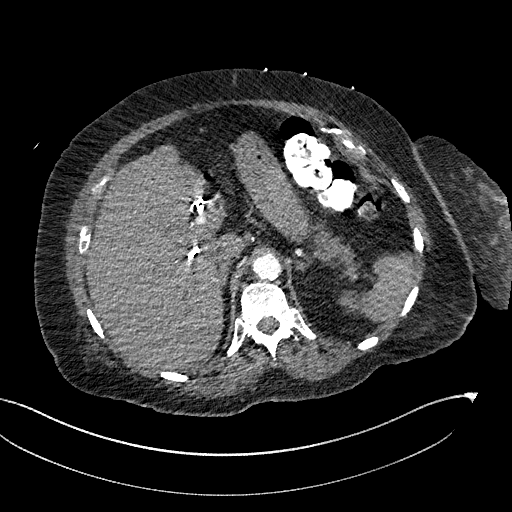
[im 82/325  soft-tissue]
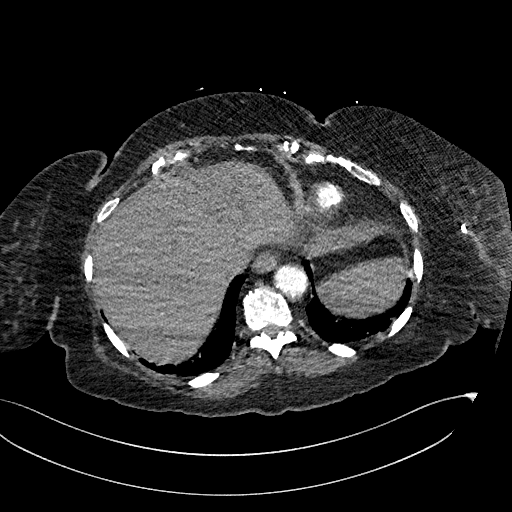
[im 109/325  soft-tissue]
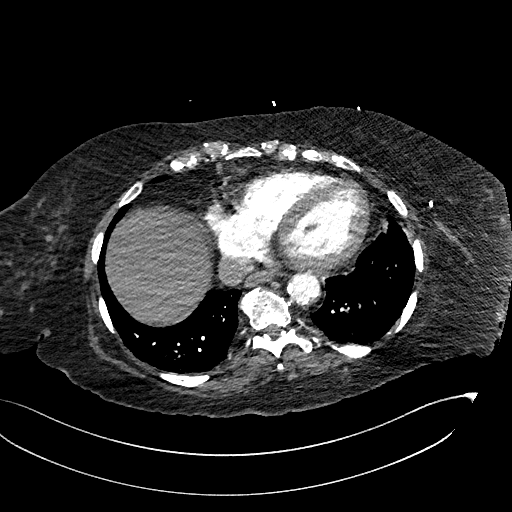
[im 136/325  soft-tissue]
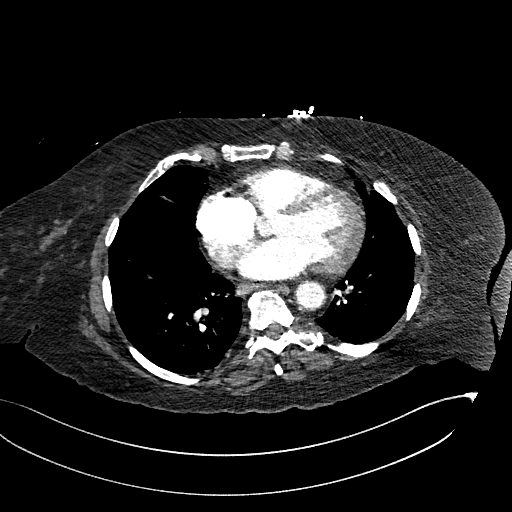
[im 190/325  soft-tissue]
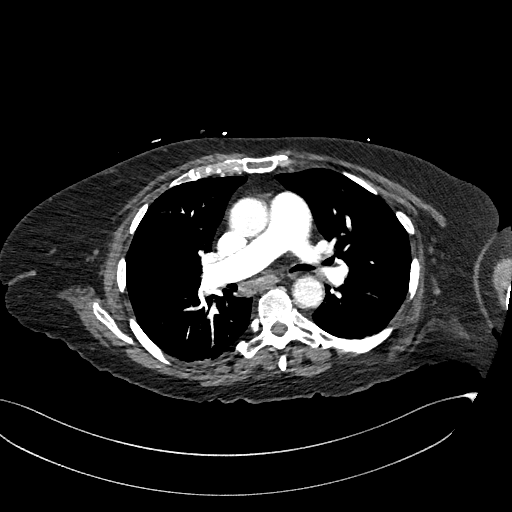
[im 217/325  soft-tissue]
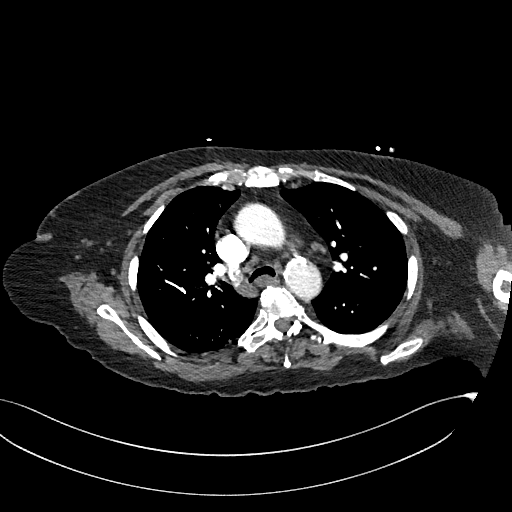
[im 244/325  soft-tissue]
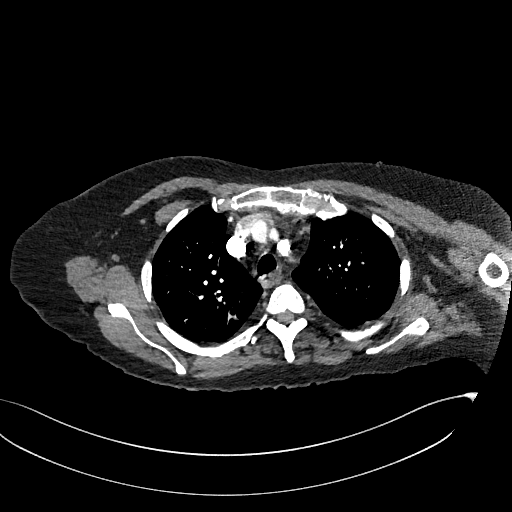
[im 298/325  soft-tissue]
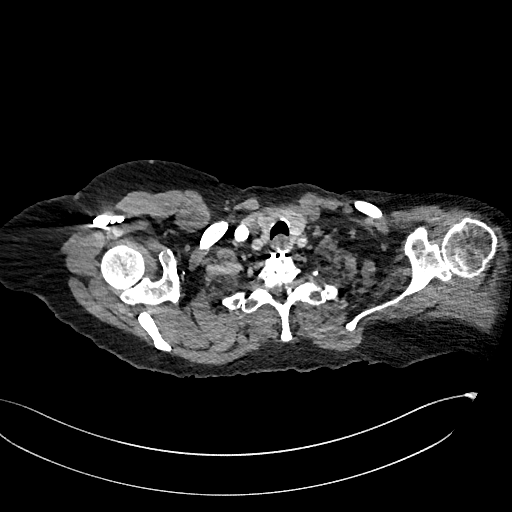

[Series 8: coronal mpr · coronal · 0.68mm/px · 1 of 101 slices shown, 2 images]
[im 51/101  soft-tissue]
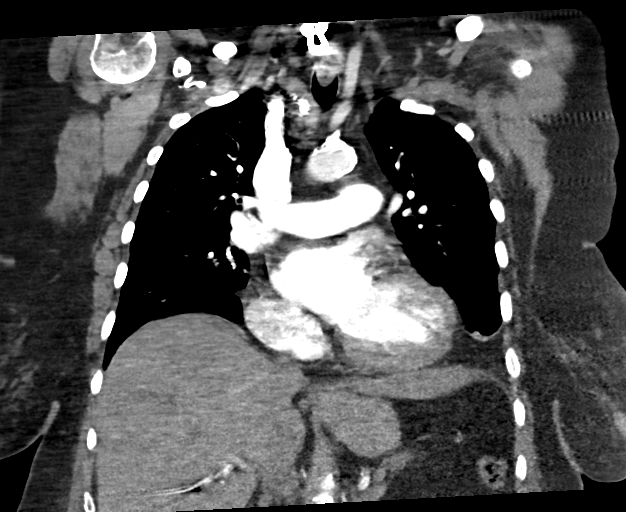
[im 51/101  bone]
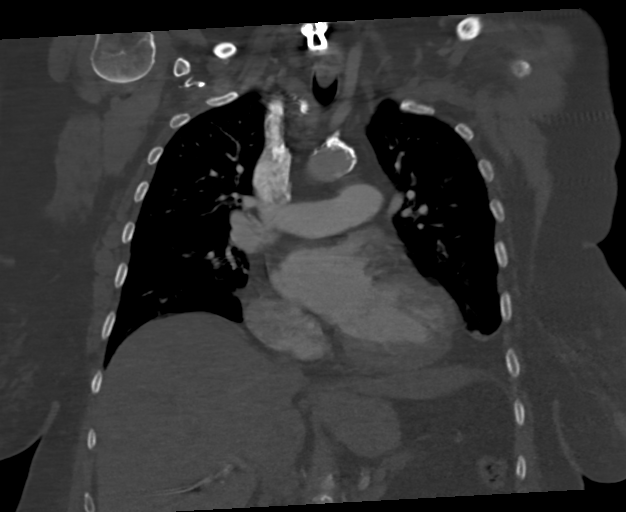

[Series 12: abd/ pelvis 5.0 i30f 1 · axial · 0.98mm/px · z∈[+1044,+1214]mm · 2 of 104 slices shown, 5 images]
[im 35/104  soft-tissue]
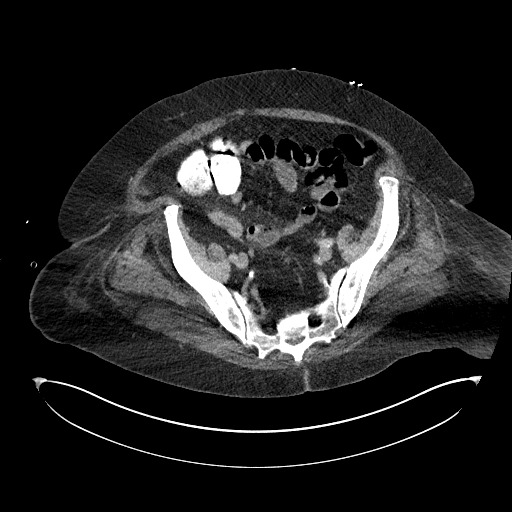
[im 35/104  lung]
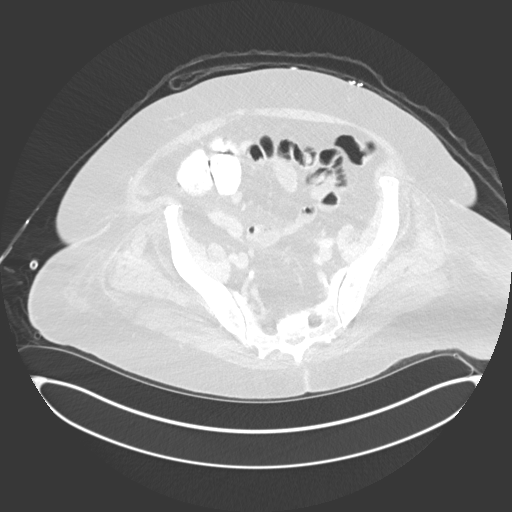
[im 35/104  bone]
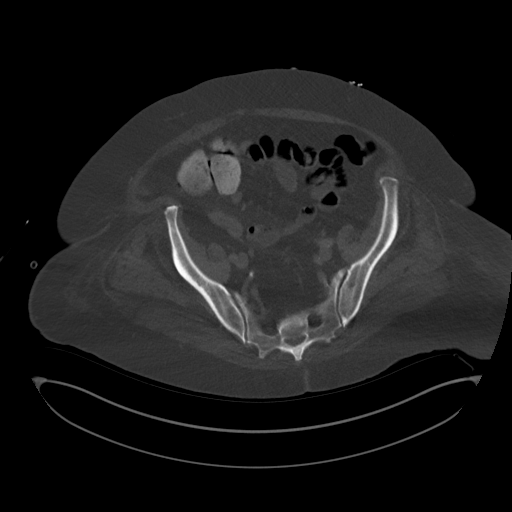
[im 69/104  soft-tissue]
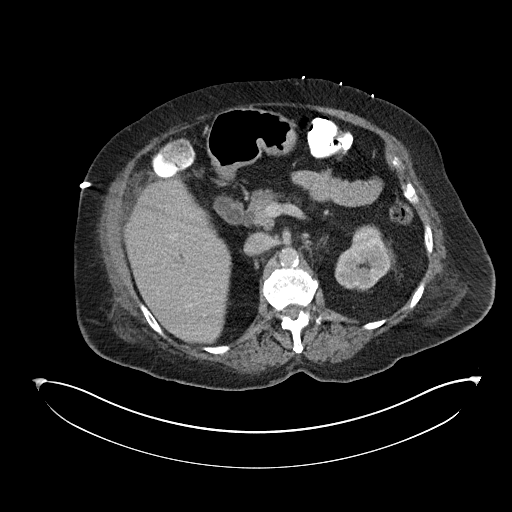
[im 69/104  lung]
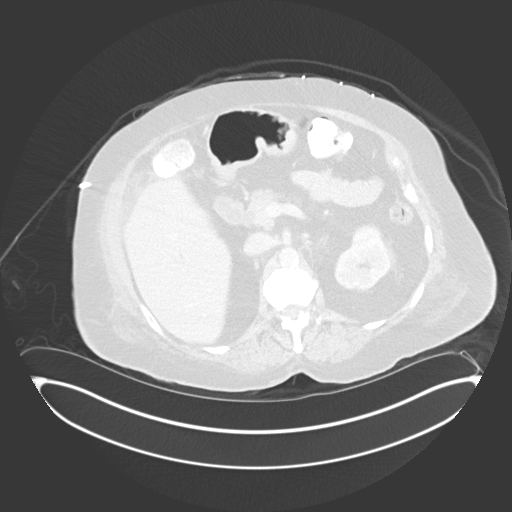

[Series 18: abd/ pelvis 5.0 i30f 1 (person_name) · axial · 0.98mm/px · z∈[+1044,+1214]mm · 2 of 104 slices shown]
[im 35/104  soft-tissue]
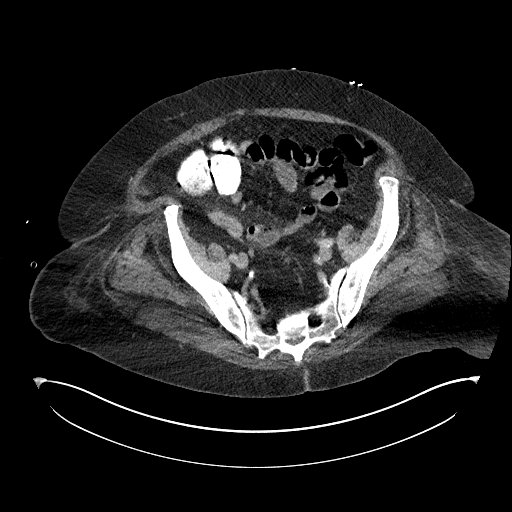
[im 69/104  soft-tissue]
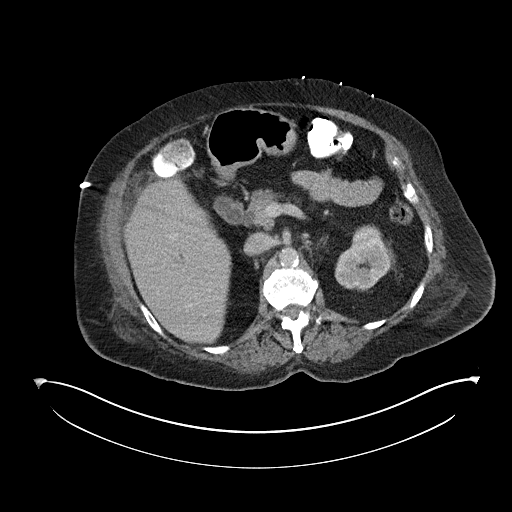

[13 of 46 positions shown; findings below may reference images not displayed]

FINDINGS: CTA CHEST FINDINGS

Cardiovascular: Atherosclerotic calcifications of aorta, proximal
great vessels and coronary arteries. Cardiac chambers appear mildly
enlarged. No pericardial effusion. Pulmonary arteries adequately
opacified and patent. No evidence of pulmonary embolism.

Mediastinum/Nodes: Base of cervical region normal appearance. No
thoracic adenopathy. Esophagus unremarkable.

Lungs/Pleura: Small focus of infiltrate or atelectasis at anterior
LEFT upper lobe image 51. Multiple tiny BILATERAL lung nodules,
noncalcified. 7 mm LEFT lower lobe nodule image 50. Remaining lungs
clear. No infiltrate, pleural effusion or pneumothorax

Musculoskeletal: Osseous demineralization. Prior cervical spine
fusion. No definite bone lesions.

Review of the MIP images confirms the above findings.

CT ABDOMEN and PELVIS FINDINGS

Hepatobiliary: Percutaneous biliary dra[REDACTED]ompression of the RIGHT
biliary system since previous exam. Persistent dilatation of the
intrahepatic biliary radicles of the LEFT lobe. No focal hepatic
mass lesion or visualized obstructing mass at the central biliary
tree. Gallbladder surgically absent.

Pancreas: Normal appearance

Spleen: Normal appearance

Adrenals/Urinary Tract: Adrenal glands normal appearance. Tiny cyst
at inferior pole RIGHT kidney. Kidneys, ureters, and bladder
otherwise unremarkable.

Stomach/Bowel: Appendix surgically absent by history. Dense retained
contrast in transverse colon. Stomach decompressed. Stomach and
bowel loops otherwise unremarkable.

Vascular/Lymphatic: Atherosclerotic calcifications of aorta and
iliac arteries. Aorta normal caliber. No adenopathy.

Reproductive: Uterus surgically absent with nonvisualization of
ovaries

Other: No free air or free fluid. No hernia or acute inflammatory
process.

Musculoskeletal: No acute osseous lesions. RIGHT hip prosthesis
noted. Degenerative disc and facet disease changes lumbar spine.

Review of the MIP images confirms the above findings.
IMPRESSION: No evidence of pulmonary embolism.

Multiple small BILATERAL noncalcified pulmonary nodules measuring up
to 7 mm in largest diameter in LEFT lobe, nonspecific but pulmonary
metastases are not excluded.

Persistent intrahepatic biliary dilatation involving the LEFT lobe
of the liver with decreased biliary dilatation of the RIGHT lobe
biliary tree post percutaneous drainage.

Scattered atherosclerotic calcifications including within coronary
arteries.
